# Patient Record
Sex: Male | Born: 1950
Health system: Southern US, Community
[De-identification: ages and names within clinical notes are randomized; demographics above are authoritative.]

## PROBLEM LIST (undated history)

## (undated) DIAGNOSIS — F419 Anxiety disorder, unspecified: Secondary | ICD-10-CM

## (undated) DIAGNOSIS — R002 Palpitations: Secondary | ICD-10-CM

## (undated) DIAGNOSIS — Z9289 Personal history of other medical treatment: Secondary | ICD-10-CM

## (undated) DIAGNOSIS — M1990 Unspecified osteoarthritis, unspecified site: Secondary | ICD-10-CM

## (undated) DIAGNOSIS — K922 Gastrointestinal hemorrhage, unspecified: Secondary | ICD-10-CM

## (undated) DIAGNOSIS — I255 Ischemic cardiomyopathy: Secondary | ICD-10-CM

## (undated) DIAGNOSIS — I10 Essential (primary) hypertension: Secondary | ICD-10-CM

## (undated) DIAGNOSIS — E785 Hyperlipidemia, unspecified: Secondary | ICD-10-CM

## (undated) DIAGNOSIS — E119 Type 2 diabetes mellitus without complications: Secondary | ICD-10-CM

## (undated) DIAGNOSIS — I219 Acute myocardial infarction, unspecified: Secondary | ICD-10-CM

## (undated) DIAGNOSIS — I2510 Atherosclerotic heart disease of native coronary artery without angina pectoris: Secondary | ICD-10-CM

## (undated) HISTORY — PX: CORONARY ANGIOPLASTY: SHX604

## (undated) HISTORY — DX: Anxiety disorder, unspecified: F41.9

## (undated) HISTORY — PX: ANTERIOR CERVICAL DECOMP/DISCECTOMY FUSION: SHX1161

## (undated) HISTORY — DX: Essential (primary) hypertension: I10

## (undated) HISTORY — DX: Palpitations: R00.2

## (undated) HISTORY — DX: Hyperlipidemia, unspecified: E78.5

## (undated) HISTORY — DX: Ischemic cardiomyopathy: I25.5

## (undated) HISTORY — DX: Personal history of other medical treatment: Z92.89

## (undated) HISTORY — DX: Type 2 diabetes mellitus without complications: E11.9

## (undated) HISTORY — DX: Atherosclerotic heart disease of native coronary artery without angina pectoris: I25.10

---

## 1998-09-12 ENCOUNTER — Encounter: Payer: Self-pay | Admitting: Internal Medicine

## 1998-09-12 ENCOUNTER — Ambulatory Visit (HOSPITAL_COMMUNITY): Admission: RE | Admit: 1998-09-12 | Discharge: 1998-09-12 | Payer: Self-pay | Admitting: Internal Medicine

## 1998-10-28 ENCOUNTER — Encounter: Payer: Self-pay | Admitting: Neurosurgery

## 1998-10-28 ENCOUNTER — Ambulatory Visit (HOSPITAL_COMMUNITY): Admission: RE | Admit: 1998-10-28 | Discharge: 1998-10-28 | Payer: Self-pay | Admitting: Neurosurgery

## 1998-11-21 ENCOUNTER — Encounter: Payer: Self-pay | Admitting: Neurosurgery

## 1998-11-21 ENCOUNTER — Ambulatory Visit (HOSPITAL_COMMUNITY): Admission: RE | Admit: 1998-11-21 | Discharge: 1998-11-21 | Payer: Self-pay | Admitting: Neurosurgery

## 1998-12-12 ENCOUNTER — Ambulatory Visit (HOSPITAL_COMMUNITY): Admission: RE | Admit: 1998-12-12 | Discharge: 1998-12-12 | Payer: Self-pay | Admitting: Neurosurgery

## 1998-12-12 ENCOUNTER — Encounter: Payer: Self-pay | Admitting: Neurosurgery

## 2001-09-20 ENCOUNTER — Emergency Department (HOSPITAL_COMMUNITY): Admission: EM | Admit: 2001-09-20 | Discharge: 2001-09-20 | Payer: Self-pay | Admitting: *Deleted

## 2001-09-20 ENCOUNTER — Encounter: Payer: Self-pay | Admitting: *Deleted

## 2010-05-01 ENCOUNTER — Emergency Department (HOSPITAL_COMMUNITY): Admission: EM | Admit: 2010-05-01 | Discharge: 2010-05-02 | Payer: Self-pay | Admitting: Emergency Medicine

## 2010-11-06 LAB — URINALYSIS, ROUTINE W REFLEX MICROSCOPIC
Bilirubin Urine: NEGATIVE
Glucose, UA: >1000 mg/dL — AB
Hgb urine dipstick: NEGATIVE
Ketones, ur: NEGATIVE mg/dL
Leukocytes, UA: NEGATIVE
Nitrite: NEGATIVE
Protein, ur: NEGATIVE mg/dL
Specific Gravity, Urine: 1.01 (ref 1.005–1.030)
Urobilinogen, UA: 0.2 mg/dL (ref 0.0–1.0)
pH: 5.5 (ref 5.0–8.0)

## 2010-11-06 LAB — POCT CARDIAC MARKERS
CKMB, poc: 3.7 ng/mL (ref 1.0–8.0)
Troponin i, poc: <0.05 ng/mL (ref 0.00–0.09)

## 2010-11-06 LAB — GLUCOSE, CAPILLARY: Glucose-Capillary: 272 mg/dL — ABNORMAL HIGH (ref 70–99)

## 2010-11-06 LAB — URINE MICROSCOPIC-ADD ON: Urine-Other: NONE SEEN

## 2010-11-06 LAB — POCT I-STAT, CHEM 8
Calcium, Ion: 1.08 mmol/L — ABNORMAL LOW (ref 1.12–1.32)
HCT: 39 % (ref 39.0–52.0)
TCO2: 23 mmol/L (ref 0–100)

## 2011-04-13 ENCOUNTER — Ambulatory Visit (INDEPENDENT_AMBULATORY_CARE_PROVIDER_SITE_OTHER): Payer: BC Managed Care – PPO | Admitting: Internal Medicine

## 2011-04-13 ENCOUNTER — Encounter: Payer: Self-pay | Admitting: Internal Medicine

## 2011-04-13 DIAGNOSIS — E1159 Type 2 diabetes mellitus with other circulatory complications: Secondary | ICD-10-CM | POA: Insufficient documentation

## 2011-04-13 DIAGNOSIS — E785 Hyperlipidemia, unspecified: Secondary | ICD-10-CM | POA: Insufficient documentation

## 2011-04-13 DIAGNOSIS — E119 Type 2 diabetes mellitus without complications: Secondary | ICD-10-CM

## 2011-04-13 DIAGNOSIS — I10 Essential (primary) hypertension: Secondary | ICD-10-CM | POA: Insufficient documentation

## 2011-04-13 DIAGNOSIS — E1165 Type 2 diabetes mellitus with hyperglycemia: Secondary | ICD-10-CM | POA: Insufficient documentation

## 2011-04-13 LAB — CBC WITH DIFFERENTIAL/PLATELET
Basophils Absolute: 0 10*3/uL (ref 0.0–0.1)
HCT: 43 % (ref 39.0–52.0)
Hemoglobin: 14 g/dL (ref 13.0–17.0)
Lymphs Abs: 1.4 10*3/uL (ref 0.7–4.0)
MCV: 86 fl (ref 78.0–100.0)
Monocytes Absolute: 0.5 10*3/uL (ref 0.1–1.0)
Monocytes Relative: 6.4 % (ref 3.0–12.0)
Neutro Abs: 5 10*3/uL (ref 1.4–7.7)
Platelets: 236 10*3/uL (ref 150.0–400.0)
RDW: 14 % (ref 11.5–14.6)

## 2011-04-13 LAB — COMPREHENSIVE METABOLIC PANEL
ALT: 27 U/L (ref 0–53)
Albumin: 4 g/dL (ref 3.5–5.2)
BUN: 13 mg/dL (ref 6–23)
CO2: 31 mEq/L (ref 19–32)
Chloride: 99 mEq/L (ref 96–112)
Creatinine, Ser: 0.9 mg/dL (ref 0.4–1.5)
Total Protein: 7.2 g/dL (ref 6.0–8.3)

## 2011-04-13 LAB — TSH: TSH: 0.26 u[IU]/mL — ABNORMAL LOW (ref 0.35–5.50)

## 2011-04-13 LAB — MICROALBUMIN / CREATININE URINE RATIO: Microalb Creat Ratio: 2.3 mg/g (ref 0.0–30.0)

## 2011-04-13 NOTE — Assessment & Plan Note (Addendum)
Information provided about diabetes, diet and exercise We'll get baseline labs and restart medications w/ results

## 2011-04-13 NOTE — Assessment & Plan Note (Addendum)
BP today without taking any medication "okay" He used to take lisinopril, consider an ARB or ACE inhibitor. We'll check a microalbumin.

## 2011-04-13 NOTE — Patient Instructions (Signed)
Please bring the records from your previous doctor including EKGs, labs, colonoscopy reports etc.

## 2011-04-13 NOTE — Assessment & Plan Note (Signed)
Used to take simvastatin 40 mg daily. He is not fasting today, will get a FLP on  return to the office.

## 2011-04-13 NOTE — Progress Notes (Signed)
  Subjective:    [name], male    DOB: August 13, 1951, 60 y.o.   MRN: [id number]  HPI New patient, here to get established Diabetes-- diagnosed about 20 years ago, at some point he lost weight and was able to go without medicines for a while however medications were restarted later on. On no medications for about a year, mostly due to lack of insurance , ambulatory sugars when checked around 250. Hypertension--no medications lately, amb BPs usually ok Hyperlipidemia--on no medicines lately, Patient report he was taking the following medications: Simvastatin 40 mg, Januvia 100 mg, Actos 45 mg daily;  lisinopril 40 mg half daily Also Glucophage ER 1000 mg twice a day Cialis 20 mg as needed At some point he also uses Lamisil and Flomax.  Past Medical History  Diagnosis Date  . Diabetes mellitus dx in the 90s  . Hyperlipidemia   . HTN (hypertension)    Past Surgical History  Procedure Date  . Neck surgery in the 90s    "disk removal"   History   Social History  . Marital Status: Married    Spouse Name: N/A    Number of Children: N/A  . Years of Education: N/A   Occupational History  . maintenance tech    Social History Main Topics  . Smoking status: Former Smoker    Quit date: 04/12/1992  . Smokeless tobacco: Not on file   Comment: >20 years ago  . Alcohol Use: Yes     rarely   . Drug Use: No  . Sexually Active: Not on file   Other Topics Concern  . Not on file   Social History Narrative   Moved back to [place] from [facility] 2011   Family History  Problem Relation Age of Onset  . Colon cancer Neg Hx   . Prostate cancer Neg Hx   . Diabetes      M, sister, brothers   . Coronary artery disease      F , MI at age 81   . Stroke Neg Hx     Review of Systems Denies chest pain or shortness of breath No nausea, vomiting, diarrhea No blurred vision, no polyuria but some polydipsia.     Objective:   Physical Exam  Constitutional: He is oriented to  person, place, and time. He appears well-developed and well-nourished. No distress.  HENT:  Head: Normocephalic and atraumatic.  Eyes: No scleral icterus.  Cardiovascular: Normal rate, regular rhythm and normal heart sounds.   No murmur heard. Pulmonary/Chest: Effort normal and breath sounds normal. No respiratory distress. He has no wheezes. He has no rales.  Abdominal: Soft. Bowel sounds are normal. He exhibits no distension. There is no tenderness. There is no rebound.  Musculoskeletal: He exhibits no edema.  Neurological: He is oriented to person, place, and time.  Skin: Skin is warm and dry. He is not diaphoretic.  Psychiatric: He has a normal mood and affect. His behavior is normal. Judgment and thought content normal.          Assessment & Plan:

## 2011-04-15 ENCOUNTER — Telehealth: Payer: Self-pay | Admitting: *Deleted

## 2011-04-15 MED ORDER — GLIMEPIRIDE 4 MG PO TABS: 2.0000 mg | ORAL_TABLET | ORAL | Status: DC

## 2011-04-15 MED ORDER — SITAGLIPTIN PHOS-METFORMIN HCL 50-1000 MG PO TABS: 1.0000 | ORAL_TABLET | Freq: Two times a day (BID) | ORAL | Status: DC

## 2011-04-15 NOTE — Telephone Encounter (Signed)
Message copied by Regis Bill on Wed Apr 15, 2011  5:02 PM ------      Message from: [name]      Created: Tue Apr 14, 2011 12:44 PM       Advise patient      Maryclare Labrador restart his diabetes medicines.      1.  Janumet XR 50/1000 mg: 1 twice a day. If he has some diarrhea, he may need to take one tablet once a day for a few days and then go for twice a day. Ok give samples and a discount card      2. Amaryl 4 mg half tablet daily.      3. Call with blood sugar readings in 2 weeks      4. Followup in 6 weeks as planned      5. TSH slightly abnormal,  will recheck in few  weeks

## 2011-04-15 NOTE — Telephone Encounter (Signed)
Per results note: Pt's wife informed Samples/disc card at front Rx[s] sent to pharmacy.

## 2011-05-07 ENCOUNTER — Telehealth: Payer: Self-pay | Admitting: Internal Medicine

## 2011-05-07 NOTE — Telephone Encounter (Signed)
Blood sugar readings received from patient, they are dropping from the 250s to 130s. Advise patient this is a very positive change, followup as planned.

## 2011-05-07 NOTE — Telephone Encounter (Signed)
Patient's wife informed

## 2011-05-28 ENCOUNTER — Ambulatory Visit (INDEPENDENT_AMBULATORY_CARE_PROVIDER_SITE_OTHER): Payer: BC Managed Care – PPO | Admitting: Internal Medicine

## 2011-05-28 ENCOUNTER — Encounter: Payer: Self-pay | Admitting: Internal Medicine

## 2011-05-28 DIAGNOSIS — M1990 Unspecified osteoarthritis, unspecified site: Secondary | ICD-10-CM | POA: Insufficient documentation

## 2011-05-28 DIAGNOSIS — E119 Type 2 diabetes mellitus without complications: Secondary | ICD-10-CM

## 2011-05-28 DIAGNOSIS — I10 Essential (primary) hypertension: Secondary | ICD-10-CM

## 2011-05-28 DIAGNOSIS — E785 Hyperlipidemia, unspecified: Secondary | ICD-10-CM

## 2011-05-28 LAB — LIPID PANEL
Cholesterol: 197 mg/dL (ref 0–200)
HDL: 40.6 mg/dL (ref >39.00)
LDL Cholesterol: 121 mg/dL — ABNORMAL HIGH (ref 0–99)
Triglycerides: 179 mg/dL — ABNORMAL HIGH (ref 0.0–149.0)

## 2011-05-28 LAB — ALT: ALT: 30 U/L (ref 0–53)

## 2011-05-28 MED ORDER — GLIMEPIRIDE 4 MG PO TABS: 2.0000 mg | ORAL_TABLET | ORAL | Status: DC

## 2011-05-28 MED ORDER — SITAGLIPTIN PHOS-METFORMIN HCL 50-1000 MG PO TABS: 1.0000 | ORAL_TABLET | Freq: Two times a day (BID) | ORAL | Status: DC

## 2011-05-28 NOTE — Assessment & Plan Note (Addendum)
At some point he was lisinopril, currently BP okay, microalbumin negative. Most likely will have to start a cholesterol medication thus will wait before we start an ACE or ARB

## 2011-05-28 NOTE — Assessment & Plan Note (Signed)
Continue with present care. Prescriptions for 90 days provided

## 2011-05-28 NOTE — Progress Notes (Signed)
  Subjective:    [name], male    DOB: 1950-09-29, 60 y.o.   MRN: [id number]  HPI Routine office visit Diabetes--good medication compliance, went from the 200s to 130s range, a couple of times felt slightly shaky when the blood sugars were around 100. Complaint of on and off knee pain, gradually getting worse,  worse on the right.  He also broke more than 50 pages of old records for my review: In the past he has taken Glucophage, Actos, Januvia, Zocor, Cialis , lisinopril, Flomax. Previous EKGs will be a scan Most recent labs are from 2010 (will be scan): CBC normal, creatinine 0.7, potassium 3.6, LFTs normal, TSH normal, total cholesterol 158, LDL 77 (on Zocor 20 mg?) After my review, most papers were returned to the patient for safe keeping  Past Medical History  Diagnosis Date  . Diabetes mellitus dx in the 90s  . Hyperlipidemia   . HTN (hypertension)    Past Surgical History  Procedure Date  . Neck surgery in the 90s    "disk removal"    Review of Systems No knee swelling or redness. No nausea-V-D    Objective:   Physical Exam  Constitutional: He appears well-developed and well-nourished.  HENT:  Head: Normocephalic and atraumatic.  Musculoskeletal: He exhibits no edema.       Knees: No redness, swelling, mild deformities consistent with DJD. Range of motion normal, joints are stable.          Assessment & Plan:  Today , I spent more than 25  min with the patient, >50% of the time reviewing more than 50 pages of old records

## 2011-05-28 NOTE — Assessment & Plan Note (Signed)
Used to be on Zocor at 40 mg per chart review. Labs

## 2011-05-28 NOTE — Assessment & Plan Note (Signed)
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

## 2011-06-05 ENCOUNTER — Telehealth: Payer: Self-pay | Admitting: *Deleted

## 2011-06-05 DIAGNOSIS — N19 Unspecified kidney failure: Secondary | ICD-10-CM

## 2011-06-05 MED ORDER — ATORVASTATIN CALCIUM 10 MG PO TABS: 10.0000 mg | ORAL_TABLET | Freq: Every day | ORAL | Status: DC

## 2011-06-05 NOTE — Telephone Encounter (Signed)
Patient informed of lab results. New Rx to pharmacy. Lab order placed in EMR. Copy of results mailed.

## 2011-06-05 NOTE — Telephone Encounter (Signed)
Message copied by Regis Bill on Fri Jun 05, 2011  5:52 PM ------      Message from: Willow Ora E      Created: Sun May 31, 2011  5:44 PM       Advise patient:      Cholesterol elevated, rec lipitor 10mg  1 po qhs #30, 4 RF      RTC as planned

## 2011-06-30 ENCOUNTER — Encounter: Payer: Self-pay | Admitting: Internal Medicine

## 2011-06-30 ENCOUNTER — Ambulatory Visit (INDEPENDENT_AMBULATORY_CARE_PROVIDER_SITE_OTHER): Payer: BC Managed Care – PPO | Admitting: Internal Medicine

## 2011-06-30 DIAGNOSIS — E119 Type 2 diabetes mellitus without complications: Secondary | ICD-10-CM

## 2011-06-30 DIAGNOSIS — Z23 Encounter for immunization: Secondary | ICD-10-CM

## 2011-06-30 DIAGNOSIS — E785 Hyperlipidemia, unspecified: Secondary | ICD-10-CM

## 2011-06-30 LAB — LIPID PANEL
Cholesterol: 165 mg/dL (ref 0–200)
HDL: 43.6 mg/dL (ref >39.00)
Total CHOL/HDL Ratio: 4
VLDL: 47 mg/dL — ABNORMAL HIGH (ref 0.0–40.0)

## 2011-06-30 LAB — HEMOGLOBIN A1C: Hgb A1c MFr Bld: 7.9 % — ABNORMAL HIGH (ref 4.6–6.5)

## 2011-06-30 MED ORDER — ONETOUCH ULTRASOFT LANCETS MISC: Status: AC

## 2011-06-30 MED ORDER — GLUCOSE BLOOD VI STRP: ORAL_STRIP | Status: DC

## 2011-06-30 NOTE — Assessment & Plan Note (Signed)
Started lipitor 10-12, tolerates well Labs

## 2011-06-30 NOTE — Assessment & Plan Note (Addendum)
Started janumet base on last A1C, no s/e Plan: labs , provided a new glucometer

## 2011-06-30 NOTE — Progress Notes (Signed)
  Subjective:    [name], male    DOB: 04/30/1951, 60 y.o.   MRN: [id number]  HPI Routine office visit, here with his wife High cholesterol--started Lipitor, good medication compliance DM-- good medication compliance  Past Medical History  Diagnosis Date  . Diabetes mellitus dx in the 90s  . Hyperlipidemia   . HTN (hypertension)    Past Surgical History  Procedure Date  . Neck surgery in the 90s    "disk removal"     Review of Systems Denies myalgias or arthralgias, at least no more than usual Blood sugars 140-130, needs a new glucometer  Got a flu shot    Objective:   Physical Exam  Constitutional: He is oriented to person, place, and time. He appears well-developed.  Cardiovascular: Normal rate, regular rhythm and normal heart sounds.   No murmur heard. Pulmonary/Chest: Effort normal and breath sounds normal. No respiratory distress. He has no wheezes. He has no rales.  Musculoskeletal: He exhibits no edema.  Neurological: He is alert and oriented to person, place, and time.          Assessment & Plan:

## 2011-06-30 NOTE — Patient Instructions (Signed)
Call your pharmacy for any refills (they will call us)

## 2011-07-02 ENCOUNTER — Other Ambulatory Visit: Payer: Self-pay

## 2011-07-02 MED ORDER — ATORVASTATIN CALCIUM 10 MG PO TABS: 10.0000 mg | ORAL_TABLET | Freq: Every day | ORAL | Status: DC

## 2011-07-02 MED ORDER — GLUCOSE BLOOD VI STRP: ORAL_STRIP | Status: DC

## 2011-07-03 ENCOUNTER — Telehealth: Payer: Self-pay

## 2011-07-03 NOTE — Telephone Encounter (Signed)
Message copied by Beverely Low on Fri Jul 03, 2011  9:03 AM ------      Message from: Willow Ora E      Created: Wed Jul 01, 2011  6:47 PM       Advise patient,      Diabetes has definitely improved. Increase glimepiride from 1/2 to one  tablet in the morning, other meds the same .      Cholesterol improved       Also next office visit should be 3 months from today, please arrange

## 2011-07-03 NOTE — Telephone Encounter (Signed)
Pt's wife aware of lab results and increase in glimepiride. Pt will call to schedule 3 mo f/u and labs mailed to pt.

## 2011-07-25 HISTORY — PX: CORONARY ANGIOPLASTY WITH STENT PLACEMENT: SHX49

## 2011-07-30 ENCOUNTER — Encounter: Payer: BC Managed Care – PPO | Admitting: Internal Medicine

## 2011-08-06 ENCOUNTER — Other Ambulatory Visit: Payer: Self-pay

## 2011-08-06 ENCOUNTER — Encounter (HOSPITAL_COMMUNITY): Payer: Self-pay | Admitting: Emergency Medicine

## 2011-08-06 ENCOUNTER — Emergency Department (HOSPITAL_COMMUNITY): Payer: BC Managed Care – PPO

## 2011-08-06 ENCOUNTER — Ambulatory Visit (HOSPITAL_COMMUNITY): Admit: 2011-08-06 | Payer: Self-pay | Admitting: Cardiovascular Disease

## 2011-08-06 ENCOUNTER — Encounter (HOSPITAL_COMMUNITY)
Admission: EM | Disposition: A | Discharge status: Home / Self Care | Payer: Self-pay | Source: Ambulatory Visit | Attending: Emergency Medicine

## 2011-08-06 ENCOUNTER — Observation Stay (HOSPITAL_COMMUNITY)
Admission: EM | Admit: 2011-08-06 | Discharge: 2011-08-07 | DRG: 853 | Disposition: A | Discharge status: Home / Self Care | Payer: BC Managed Care – PPO | Source: Ambulatory Visit | Attending: Cardiology | Admitting: Cardiology

## 2011-08-06 DIAGNOSIS — M1990 Unspecified osteoarthritis, unspecified site: Secondary | ICD-10-CM | POA: Insufficient documentation

## 2011-08-06 DIAGNOSIS — E1159 Type 2 diabetes mellitus with other circulatory complications: Secondary | ICD-10-CM | POA: Insufficient documentation

## 2011-08-06 DIAGNOSIS — Z8249 Family history of ischemic heart disease and other diseases of the circulatory system: Secondary | ICD-10-CM | POA: Insufficient documentation

## 2011-08-06 DIAGNOSIS — I2510 Atherosclerotic heart disease of native coronary artery without angina pectoris: Secondary | ICD-10-CM

## 2011-08-06 DIAGNOSIS — I10 Essential (primary) hypertension: Secondary | ICD-10-CM | POA: Insufficient documentation

## 2011-08-06 DIAGNOSIS — R61 Generalized hyperhidrosis: Secondary | ICD-10-CM | POA: Insufficient documentation

## 2011-08-06 DIAGNOSIS — E1165 Type 2 diabetes mellitus with hyperglycemia: Secondary | ICD-10-CM | POA: Insufficient documentation

## 2011-08-06 DIAGNOSIS — Z87891 Personal history of nicotine dependence: Secondary | ICD-10-CM | POA: Insufficient documentation

## 2011-08-06 DIAGNOSIS — Z23 Encounter for immunization: Secondary | ICD-10-CM | POA: Insufficient documentation

## 2011-08-06 DIAGNOSIS — I214 Non-ST elevation (NSTEMI) myocardial infarction: Principal | ICD-10-CM

## 2011-08-06 DIAGNOSIS — R0602 Shortness of breath: Secondary | ICD-10-CM | POA: Insufficient documentation

## 2011-08-06 DIAGNOSIS — E119 Type 2 diabetes mellitus without complications: Secondary | ICD-10-CM | POA: Insufficient documentation

## 2011-08-06 DIAGNOSIS — E785 Hyperlipidemia, unspecified: Secondary | ICD-10-CM | POA: Insufficient documentation

## 2011-08-06 HISTORY — DX: Unspecified osteoarthritis, unspecified site: M19.90

## 2011-08-06 HISTORY — PX: LEFT HEART CATHETERIZATION WITH CORONARY ANGIOGRAM: SHX5451

## 2011-08-06 LAB — CARDIAC PANEL(CRET KIN+CKTOT+MB+TROPI)
Relative Index: 2.2 (ref 0.0–2.5)
Total CK: 464 U/L — ABNORMAL HIGH (ref 7–232)
Total CK: 423 U/L — ABNORMAL HIGH (ref 7–232)
Troponin I: <0.3 ng/mL (ref <0.30)

## 2011-08-06 LAB — URINALYSIS, ROUTINE W REFLEX MICROSCOPIC
Ketones, ur: 15 mg/dL — AB
Leukocytes, UA: NEGATIVE
Nitrite: NEGATIVE
Urobilinogen, UA: 0.2 mg/dL (ref 0.0–1.0)
pH: 7 (ref 5.0–8.0)

## 2011-08-06 LAB — CBC
HCT: 40.9 % (ref 39.0–52.0)
MCHC: 34.5 g/dL (ref 30.0–36.0)
MCV: 82 fL (ref 78.0–100.0)
RDW: 13.6 % (ref 11.5–15.5)

## 2011-08-06 LAB — POCT I-STAT TROPONIN I: Troponin i, poc: 0.1 ng/mL (ref 0.00–0.08)

## 2011-08-06 LAB — COMPREHENSIVE METABOLIC PANEL
AST: 29 U/L (ref 0–37)
Albumin: 3.7 g/dL (ref 3.5–5.2)
Calcium: 9.1 mg/dL (ref 8.4–10.5)
Chloride: 105 mEq/L (ref 96–112)
Creatinine, Ser: 0.59 mg/dL (ref 0.50–1.35)
Total Protein: 7.1 g/dL (ref 6.0–8.3)

## 2011-08-06 LAB — URINE MICROSCOPIC-ADD ON

## 2011-08-06 LAB — DIFFERENTIAL
Basophils Absolute: 0 10*3/uL (ref 0.0–0.1)
Basophils Relative: 1 % (ref 0–1)
Eosinophils Relative: 5 % (ref 0–5)
Lymphocytes Relative: 28 % (ref 12–46)
Monocytes Absolute: 0.7 10*3/uL (ref 0.1–1.0)

## 2011-08-06 LAB — PRO B NATRIURETIC PEPTIDE: Pro B Natriuretic peptide (BNP): 165.1 pg/mL — ABNORMAL HIGH (ref 0–125)

## 2011-08-06 LAB — GLUCOSE, CAPILLARY: Glucose-Capillary: 170 mg/dL — ABNORMAL HIGH (ref 70–99)

## 2011-08-06 LAB — MRSA PCR SCREENING: MRSA by PCR: NEGATIVE

## 2011-08-06 LAB — POCT ACTIVATED CLOTTING TIME: Activated Clotting Time: 479 seconds

## 2011-08-06 SURGERY — LEFT HEART CATHETERIZATION WITH CORONARY ANGIOGRAM
Anesthesia: LOCAL

## 2011-08-06 MED ORDER — HEPARIN BOLUS VIA INFUSION
4000.0000 [IU] | Freq: Once | INTRAVENOUS | Status: AC
  Administered 2011-08-06: 4000 [IU] via INTRAVENOUS
  Filled 2011-08-06: qty 4000

## 2011-08-06 MED ORDER — ALPRAZOLAM 0.25 MG PO TABS: 0.2500 mg | ORAL_TABLET | Freq: Two times a day (BID) | ORAL | Status: DC | PRN

## 2011-08-06 MED ORDER — BIVALIRUDIN 250 MG IV SOLR
INTRAVENOUS | Status: AC
  Filled 2011-08-06: qty 250

## 2011-08-06 MED ORDER — SODIUM CHLORIDE 0.9 % IJ SOLN: 3.0000 mL | INTRAMUSCULAR | Status: DC | PRN

## 2011-08-06 MED ORDER — NITROGLYCERIN IN D5W 200-5 MCG/ML-% IV SOLN: 3.0000 ug/min | INTRAVENOUS | Status: DC

## 2011-08-06 MED ORDER — MORPHINE SULFATE 2 MG/ML IJ SOLN
2.0000 mg | INTRAMUSCULAR | Status: DC | PRN
  Administered 2011-08-06: 2 mg via INTRAVENOUS
  Filled 2011-08-06: qty 1

## 2011-08-06 MED ORDER — SODIUM CHLORIDE 0.9 % IV SOLN: 250.0000 mL | INTRAVENOUS | Status: DC | PRN

## 2011-08-06 MED ORDER — CLOPIDOGREL BISULFATE 300 MG PO TABS
ORAL_TABLET | ORAL | Status: AC
  Filled 2011-08-06: qty 2

## 2011-08-06 MED ORDER — NITROGLYCERIN 0.4 MG SL SUBL
0.4000 mg | SUBLINGUAL_TABLET | SUBLINGUAL | Status: DC | PRN
  Administered 2011-08-06 (×2): 0.4 mg via SUBLINGUAL

## 2011-08-06 MED ORDER — SODIUM CHLORIDE 0.9 % IJ SOLN: 3.0000 mL | Freq: Two times a day (BID) | INTRAMUSCULAR | Status: DC

## 2011-08-06 MED ORDER — METOPROLOL TARTRATE 25 MG PO TABS
25.0000 mg | ORAL_TABLET | Freq: Four times a day (QID) | ORAL | Status: DC
  Administered 2011-08-07 (×2): 25 mg via ORAL
  Filled 2011-08-06 (×5): qty 1

## 2011-08-06 MED ORDER — ROSUVASTATIN CALCIUM 20 MG PO TABS: 20.0000 mg | ORAL_TABLET | Freq: Every day | ORAL | Status: DC

## 2011-08-06 MED ORDER — GLIMEPIRIDE 4 MG PO TABS
4.0000 mg | ORAL_TABLET | Freq: Every day | ORAL | Status: DC
Start: 2011-08-07 — End: 2011-08-07
  Administered 2011-08-07: 4 mg via ORAL
  Filled 2011-08-06 (×2): qty 1

## 2011-08-06 MED ORDER — MORPHINE SULFATE 4 MG/ML IJ SOLN
4.0000 mg | INTRAMUSCULAR | Status: DC | PRN
  Administered 2011-08-06: 4 mg via INTRAVENOUS
  Filled 2011-08-06: qty 1

## 2011-08-06 MED ORDER — ONDANSETRON HCL 4 MG/2ML IJ SOLN: 4.0000 mg | Freq: Four times a day (QID) | INTRAMUSCULAR | Status: DC | PRN

## 2011-08-06 MED ORDER — VERAPAMIL HCL 2.5 MG/ML IV SOLN
INTRAVENOUS | Status: AC
  Filled 2011-08-06: qty 2

## 2011-08-06 MED ORDER — INSULIN ASPART 100 UNIT/ML ~~LOC~~ SOLN
0.0000 [IU] | Freq: Three times a day (TID) | SUBCUTANEOUS | Status: DC
  Administered 2011-08-07: 5 [IU] via SUBCUTANEOUS
  Administered 2011-08-07: 3 [IU] via SUBCUTANEOUS
  Filled 2011-08-06: qty 1

## 2011-08-06 MED ORDER — SODIUM CHLORIDE 0.9 % IJ SOLN
3.0000 mL | Freq: Two times a day (BID) | INTRAMUSCULAR | Status: DC
  Administered 2011-08-07: 3 mL via INTRAVENOUS

## 2011-08-06 MED ORDER — INSULIN ASPART 100 UNIT/ML ~~LOC~~ SOLN
0.0000 [IU] | Freq: Every day | SUBCUTANEOUS | Status: DC
  Administered 2011-08-06: 2 [IU] via SUBCUTANEOUS
  Filled 2011-08-06: qty 3

## 2011-08-06 MED ORDER — PNEUMOCOCCAL VAC POLYVALENT 25 MCG/0.5ML IJ INJ
0.5000 mL | INJECTION | INTRAMUSCULAR | Status: AC
  Administered 2011-08-07: 0.5 mL via INTRAMUSCULAR
  Filled 2011-08-06: qty 0.5

## 2011-08-06 MED ORDER — ACETAMINOPHEN 325 MG PO TABS: 650.0000 mg | ORAL_TABLET | ORAL | Status: DC | PRN

## 2011-08-06 MED ORDER — ASPIRIN EC 81 MG PO TBEC
81.0000 mg | DELAYED_RELEASE_TABLET | Freq: Every day | ORAL | Status: DC
  Administered 2011-08-07: 81 mg via ORAL
  Filled 2011-08-06: qty 1

## 2011-08-06 MED ORDER — ROSUVASTATIN CALCIUM 40 MG PO TABS
40.0000 mg | ORAL_TABLET | Freq: Every day | ORAL | Status: DC
  Administered 2011-08-06 – 2011-08-07 (×2): 40 mg via ORAL
  Filled 2011-08-06 (×2): qty 1

## 2011-08-06 MED ORDER — METOPROLOL TARTRATE 25 MG PO TABS
ORAL_TABLET | ORAL | Status: AC
  Filled 2011-08-06: qty 1

## 2011-08-06 MED ORDER — HEPARIN (PORCINE) IN NACL 100-0.45 UNIT/ML-% IJ SOLN
1500.0000 [IU]/h | INTRAMUSCULAR | Status: DC
  Filled 2011-08-06 (×2): qty 250

## 2011-08-06 MED ORDER — HEPARIN SODIUM (PORCINE) 1000 UNIT/ML IJ SOLN
INTRAMUSCULAR | Status: AC
  Filled 2011-08-06: qty 1

## 2011-08-06 MED ORDER — NITROGLYCERIN 0.4 MG SL SUBL: 0.4000 mg | SUBLINGUAL_TABLET | SUBLINGUAL | Status: DC | PRN

## 2011-08-06 MED ORDER — HEPARIN SOD (PORCINE) IN D5W 100 UNIT/ML IV SOLN
INTRAVENOUS | Status: AC
  Administered 2011-08-06: 25000 [IU] via INTRAVENOUS
  Filled 2011-08-06: qty 250

## 2011-08-06 MED ORDER — HEPARIN (PORCINE) IN NACL 2-0.9 UNIT/ML-% IJ SOLN
INTRAMUSCULAR | Status: AC
  Filled 2011-08-06: qty 2000

## 2011-08-06 MED ORDER — MIDAZOLAM HCL 2 MG/2ML IJ SOLN
INTRAMUSCULAR | Status: AC
  Filled 2011-08-06: qty 2

## 2011-08-06 MED ORDER — NITROGLYCERIN 0.2 MG/ML ON CALL CATH LAB
INTRAVENOUS | Status: AC
  Filled 2011-08-06: qty 1

## 2011-08-06 MED ORDER — CLOPIDOGREL BISULFATE 75 MG PO TABS
75.0000 mg | ORAL_TABLET | Freq: Every day | ORAL | Status: DC
  Administered 2011-08-07: 75 mg via ORAL
  Filled 2011-08-06 (×2): qty 1

## 2011-08-06 MED ORDER — HEPARIN (PORCINE) IN NACL 100-0.45 UNIT/ML-% IJ SOLN
1000.0000 [IU]/h | INTRAMUSCULAR | Status: DC
  Administered 2011-08-06: 1000 [IU]/h via INTRAVENOUS
  Filled 2011-08-06: qty 250

## 2011-08-06 MED ORDER — NITROGLYCERIN IN D5W 200-5 MCG/ML-% IV SOLN
2.0000 ug/min | INTRAVENOUS | Status: DC
  Administered 2011-08-06: 5 ug/min via INTRAVENOUS
  Filled 2011-08-06: qty 250

## 2011-08-06 MED ORDER — OXYCODONE-ACETAMINOPHEN 5-325 MG PO TABS: 1.0000 | ORAL_TABLET | ORAL | Status: DC | PRN

## 2011-08-06 MED ORDER — GI COCKTAIL ~~LOC~~
30.0000 mL | Freq: Three times a day (TID) | ORAL | Status: DC | PRN
  Administered 2011-08-06: 30 mL via ORAL
  Filled 2011-08-06 (×2): qty 30

## 2011-08-06 MED ORDER — ZOLPIDEM TARTRATE 5 MG PO TABS: 5.0000 mg | ORAL_TABLET | Freq: Every evening | ORAL | Status: DC | PRN

## 2011-08-06 MED ORDER — ASPIRIN 81 MG PO CHEW
324.0000 mg | CHEWABLE_TABLET | Freq: Once | ORAL | Status: AC
  Administered 2011-08-06: 324 mg via ORAL
  Filled 2011-08-06: qty 4

## 2011-08-06 MED ORDER — FENTANYL CITRATE 0.05 MG/ML IJ SOLN
INTRAMUSCULAR | Status: AC
  Filled 2011-08-06: qty 2

## 2011-08-06 MED ORDER — THERA M PLUS PO TABS
1.0000 | ORAL_TABLET | Freq: Every day | ORAL | Status: DC
  Administered 2011-08-07: 1 via ORAL
  Filled 2011-08-06: qty 1

## 2011-08-06 MED ORDER — PANTOPRAZOLE SODIUM 40 MG PO TBEC
40.0000 mg | DELAYED_RELEASE_TABLET | Freq: Every day | ORAL | Status: DC
  Administered 2011-08-07: 40 mg via ORAL
  Filled 2011-08-06: qty 1

## 2011-08-06 MED ORDER — ASPIRIN 81 MG PO CHEW: 81.0000 mg | CHEWABLE_TABLET | Freq: Every day | ORAL | Status: DC

## 2011-08-06 MED ORDER — LIDOCAINE HCL (PF) 1 % IJ SOLN
INTRAMUSCULAR | Status: AC
  Filled 2011-08-06: qty 30

## 2011-08-06 MED ORDER — SODIUM CHLORIDE 0.9 % IV SOLN: INTRAVENOUS | Status: AC

## 2011-08-06 NOTE — Interval H&P Note (Signed)
History and Physical Interval Note:  The patient is here today for a left heart catheterization. I have personally reviewed the labs and examined the patient. The History and Physical has been reviewed and there are no changes. The risks and benefits of the procedure have been reviewed with the patient. Informed consent has been signed by the patient and is present in the chart. The patient agrees to proceed with the procedure.    Randy Castro 2:35 PM 08/06/2011

## 2011-08-06 NOTE — H&P (Signed)
History and Physical  Patient ID: Randy Castro. Patient ID: Randy Castro. MRN: [id number], DOB/AGE: 59-Sep-1952 59 y.o. Date of Encounter: 08/06/2011  Primary Physician: Dr Drue Novel  Primary Cardiologist: none  Chief Complaint: chest pain, elevated CKMB  HPI:  [name] is a 60 year old male with no previous history of coronary artery disease. He was in his usual state of health until 2 days ago. He had onset of substernal chest pain he describes is both a burning and a pressure while exerting himself climbing ladders. He was short of breath with this also. He felt fatigued and as though his blood sugar were low but did not check it. He rested and ate and the symptoms resolved. He continued to feel some fatigue but nothing else. He had never had this before.  Yesterday, he felt some fatigue but no other symptoms. Last p.m., he drank a diet coke and had onset of burning and pressure chest pain at a 4/10. He denies shortness of breath or nausea. He took a Prilosec OTC and his symptoms improved. He continued to feel intermittent pain throughout the night and slept poorly. This a.m. he was still complaining of 2/10 pain so he took another Prilosec. After that, he ate and drank coffee. He then had onset of 9 or 10/10 chest pain that he describes as the same burning and pressure sensation. He was a little short of breath with it. He denies nausea. He was not diaphoretic. He feels that his skin was sensitive and every touch was painful. He felt fatigued. His wife brought him to the emergency room. In the emergency room he received sublingual nitroglycerin x2 as well as aspirin 81 mg x4. His symptoms have currently improved and his chest pain is a 1 or 2/10. He feels the nitroglycerin and made the most difference. He ate breakfast about 6am.  Past Medical History  Diagnosis Date  . Diabetes mellitus dx in the 90s  . Hyperlipidemia   . HTN (hypertension)   . Arthritis      Surgical History:  Past  Surgical History  Procedure Date  . Neck surgery in the 90s    "disk removal"     I have reviewed the patient's current medications. Continuous:  . heparin 1,500 Units/hr (08/06/11 1016)  . nitroGLYCERIN 5 mcg/min (08/06/11 0957)   nitroglycerin Sublingual x 2   Meds: Current facility-administered medications: 1. aspirin chewable tablet 324 mg, 324 mg, Oral, Once, Gwyneth Sprout, MD, 324 mg at 08/06/11 0825;  2.  heparin ADULT infusion 100 units/mL (25000 units/250 mL), 1,500 Units/hr, Intravenous,Last Rate: 15 mL/hr  3. heparin bolus via infusion 4,000 Units, 4,000 Units, Intravenous, Once, Gwyneth Sprout, MD, 4. nitroGLYCERIN (NITROSTAT) SL tablet 0.4 mg, 0.4 mg, Sublingual, Q5 min PRN, Gwyneth Sprout, MD,  5.  nitroGLYCERIN 0.2 mg/mL in dextrose 5 % infusion, 2-200 mcg/min, Intravenous,  6. pneumococcal 23 valent vaccine (PNU-IMMUNE) injection 0.5 mL, 0.5 mL, Intramuscular, Tomorrow-1000,   Current outpatient prescriptions: 1.atorvastatin (LIPITOR) 10 MG tablet, Take 10 mg by mouth at bedtime. Recently started   2. glimepiride (AMARYL) 4 MG tablet, Take 4 mg by mouth every morning.  , Disp: , Rfl: ;  3.  Multiple Vitamins-Minerals (MULTIVITAMINS THER. W/MINERALS) TABS, Take 1 tablet by mouth daily.   4.  sitaGLIPtan-metformin (JANUMET) 50-1000 MG per tablet, Take 1 tablet by mouth 2 (two) times daily   Allergies: No Known Allergies  History   Social History  . Marital Status: Married  Spouse Name: N/A    Number of Children: N/A  . Years of Education: N/A   Occupational History  . maintenance tech    Social History Main Topics  . Smoking status: Former Smoker    Quit date: 04/12/1992  . Smokeless tobacco: Never Used   Quit  >25 years ago  . Alcohol Use: Yes     rarely   . Drug Use: No  . Sexually Active: Yes   Social History Narrative   Moved back to [place] from [facility] 2011     Family History  Problem Relation Age of Onset  . Colon cancer Neg Hx   . Prostate  cancer Neg Hx   . Diabetes      Mother, sister, brothers   . Coronary artery disease Mother died with CAD at 67     Father  Died of MI at age 73, 1 brother with CAD, another brother with CHF  . Stroke Neg Hx      Physical Exam: Blood pressure 160/84, pulse 59, temperature 97.4 F (36.3 C), temperature source Oral, resp. rate 20, height 6\' 2"  (1.88 m), weight 265 lb (120.203 kg), SpO2 97.00%. General: Well developed, well nourished, in no acute distress. Head: Normocephalic, atraumatic, sclera non-icteric, no xanthomas, nares are without discharge.  Neck: Negative for carotid bruits. JVD not elevated. No thyromegally Lungs: Clear bilaterally to auscultation without wheezes, rales, or rhonchi. Breathing is unlabored. Heart: RRR with S1 S2. No murmurs, rubs, or gallops appreciated. Abdomen: Soft, non-tender, non-distended with normoactive bowel sounds. No hepatomegaly. No rebound/guarding. No obvious abdominal masses. Msk:  Strength and tone appear normal for age. Extremities: No clubbing or cyanosis. No edema.  Distal pedal pulses are 2+ and equal bilaterally. Neuro: Alert and oriented X 3. Moves all extremities spontaneously. No focal deficits noted. Psych:  Responds to questions appropriately with a normal affect. Skin: No rashes or lesions noted  Review of Systems: Occasional musculoskeletal aches and pains, no recent illnesses/fevers/chills, no URI, pt pretty good with a diabetic diet but does not check blood sugars that much. All other systems reviewed and are otherwise negative except as noted above.  Labs:  Lab Results  Component Value Date   WBC 6.9 08/06/2011   HGB 14.1 08/06/2011   HCT 40.9 08/06/2011   MCV 82.0 08/06/2011   PLT 229 08/06/2011    Lab 08/06/11 0810  NA 138  K 3.8  CL 105  CO2 23  BUN 12  CREATININE 0.59  CALCIUM 9.1  PROT 7.1  BILITOT 0.4  ALKPHOS 85  ALT 32  AST 29  GLUCOSE 289*    Basename 08/06/11 0810  CKTOTAL 464*  CKMB 10.2*  Index  2.2  TROPONINI <0.30   Lab Results  Component Value Date   CHOL 165 06/30/2011   HDL 43.60 06/30/2011   LDLCALC 121* 05/28/2011   TRIG 235.0* 06/30/2011   No results found for this basename: DDIMER    Radiology/Studies:  Dg Chest Port 1 View 08/06/2011  *RADIOLOGY REPORT*  Clinical Data: Chest pain, left arm pain, nausea.  PORTABLE CHEST - 1 VIEW  Comparison: None  Findings: Mild bibasilar and perihilar opacities, likely atelectasis.  Low lung volumes.  Heart is normal size.  No effusions.  IMPRESSION: Areas of atelectasis bilaterally.  Original Report Authenticated By: Cyndie Chime, M.D.     EKG: SR, lateral T wave flattening from 2011  ASSESSMENT AND PLAN: The patient was seen today by Dr Shirlee Latch, the patient evaluated and the  data reviewed. His chest pain has both typical and atypical features. The initial episode started with exertion. The other 2 episodes started after meals but improved with nitroglycerin. Additionally, his CK-MB is elevated and his EKG is slightly different from an EKG dated 2011. He also has multiple cardiac risk factors and including a strong family history of premature coronary artery disease.   He will be admitted. We will continue the IV nitroglycerin and heparin. He will also be continued on aspirin, and a statin. Sliding scale insulin will be added to manage his blood sugars. We will add a beta blocker to his medication regimen for better heart rate and blood pressure control.   The risks and benefits of a cardiac catheterization including, but not limited to, death, stroke, MI, kidney damage and bleeding were discussed with the patient and his family.  Signed, Bjorn Loser Barrett PA-C 08/06/2011, 11:00 AM   Patient seen with PA, agree with note.  History of DM, HTN, hyperlipidemia.  Patient has NSTEMI with elevated CKMB and troponin.  Lateral T wave flattening on ECG. Chest pain on and off x 2 days, CP now very mild but still present.  - Left heart cath today.    - ASA, heparin gtt, NTG gtt (titrate to pain-free), Crestor 40, metoprolol 25 mg q6 hrs.   - As he will be going to cath lab soon, will defer upfront ticagrelor or Plavix.   [name] 08/06/2011 1:28 PM

## 2011-08-06 NOTE — ED Notes (Signed)
Plunkett, MD notified of abnormal lab test results 

## 2011-08-06 NOTE — ED Notes (Signed)
Did and CBG on the patient it was 87 notified RN Tammy Sours of the results

## 2011-08-06 NOTE — ED Notes (Signed)
Pt c/o mid sternal CP with radiation to left arm starting yesterday intermittently becoming worse today; pt sts pain some SOB and diaphoresis; pt denies nausea; pt denies fever or cough

## 2011-08-06 NOTE — Progress Notes (Signed)
ANTICOAGULATION CONSULT NOTE - Initial Consult  Pharmacy Consult for Heparin Indication: chest pain/ACS  No Known Allergies  Patient Measurements: Height: 6\' 2"  (188 cm) Weight: 265 lb (120.203 kg) IBW/kg (Calculated) : 82.2  Heparin Dosing Weight: 108kg  Vital Signs: Temp: 97.4 F (36.3 C) (12/13 0752) Temp src: Oral (12/13 0752) BP: 160/84 mmHg (12/13 0830) Pulse Rate: 59  (12/13 0830)  Labs:  [facility] 08/06/11 0810  HGB 14.1  HCT 40.9  PLT 229  APTT 26  LABPROT 14.1  INR 1.07  HEPARINUNFRC --  CREATININE 0.59  CKTOTAL 464*  CKMB 10.2*  TROPONINI <0.30   Estimated Creatinine Clearance: 137 ml/min (by C-G formula based on Cr of 0.59).  Medical History: Past Medical History  Diagnosis Date  . Diabetes mellitus dx in the 90s  . Hyperlipidemia   . HTN (hypertension)     Home Medications:  Lipitor 10mg  QHS Amaryl 4mg  QAM MVI Qday Janumet 50/1000 BID with meals  Anticoagulation: Heparin 4000 units IV bolus given at 0935 Heparin infusion started at 1000 units/hr at 0939  Assessment: NSTEMI: To continue anticoagulation with Heparin for NSTEMI.  Current Heparin infusion rate is ~9 units/kg/hr which is lower than the recommended starting rate of 14 units/kg/hr.  Goal of Therapy:  Heparin level 0.3-0.7 units/ml   Plan:  Increase Heparin infusion to 1500 units/hr. Check Heparin level 6 hours after starting Heparin. Daily Heparin level and CBC.  Estella Husk, Pharm.D., BCPS Clinical Pharmacist  Pager [phone] 08/06/2011, 9:54 AM

## 2011-08-06 NOTE — Progress Notes (Signed)
Visited with patient who spoke about his fear earlier in the morning.  He reported that he is feeling much better now.  I offered support.   Thornell Sartorius 12:19 PM   08/06/11 1200  Clinical Encounter Type  Visited With Patient  Visit Type Initial

## 2011-08-06 NOTE — ED Notes (Addendum)
Lab called with troponin of 0.34

## 2011-08-06 NOTE — ED Provider Notes (Addendum)
History     CSN: [id number] Arrival date & time: 08/06/2011  7:48 AM   First MD Initiated Contact with Patient 08/06/11 [phone]      Chief Complaint  Patient presents with  . Chest Pain    (Consider location/radiation/quality/duration/timing/severity/associated sxs/prior treatment) Patient is a 60 y.o. male presenting with chest pain. The history is provided by the patient.  Chest Pain The chest pain began yesterday. Duration of episode(s) is 30 minutes. Chest pain occurs intermittently. The chest pain is improving. The pain is associated with exertion (Now it gets worse with even standing). At its most intense, the pain is at 9/10. The pain is currently at 4/10. The severity of the pain is severe. The quality of the pain is described as aching, pressure-like and vice-like. The pain radiates to the left arm and right arm. Chest pain is worsened by exertion. Primary symptoms include shortness of breath. Pertinent negatives for primary symptoms include no cough, no wheezing, no palpitations, no abdominal pain, no nausea, no vomiting and no dizziness.  Associated symptoms include diaphoresis.  Pertinent negatives for associated symptoms include no lower extremity edema, no numbness and no weakness. He tried nothing for the symptoms. Risk factors include male gender (Former smoker).  His past medical history is significant for diabetes, hyperlipidemia and hypertension.  His family medical history is significant for CAD in family and early MI in family. Family history comments: His father and sister both died in their 47s of MIs. His brother who is 15 years older than him has had multiple stents and CABG. His mother also had a heart attack.  Procedure history is negative for cardiac catheterization, stress echo and exercise treadmill test.     Past Medical History  Diagnosis Date  . Diabetes mellitus dx in the 90s  . Hyperlipidemia   . HTN (hypertension)     Past Surgical History  Procedure  Date  . Neck surgery in the 90s    "disk removal"    Family History  Problem Relation Age of Onset  . Colon cancer Neg Hx   . Prostate cancer Neg Hx   . Diabetes      M, sister, brothers   . Coronary artery disease      F , MI at age 15   . Stroke Neg Hx     History  Substance Use Topics  . Smoking status: Former Smoker    Quit date: 04/12/1992  . Smokeless tobacco: Not on file   Comment: >20 years ago  . Alcohol Use: Yes     rarely       Review of Systems  Constitutional: Positive for diaphoresis.  Respiratory: Positive for shortness of breath. Negative for cough and wheezing.   Cardiovascular: Positive for chest pain. Negative for palpitations.  Gastrointestinal: Negative for nausea, vomiting and abdominal pain.  Neurological: Negative for dizziness, weakness and numbness.  All other systems reviewed and are negative.    Allergies  Review of patient's allergies indicates no known allergies.  Home Medications   Current Outpatient Rx  Name Route Sig Dispense Refill  . ATORVASTATIN CALCIUM 10 MG PO TABS Oral Take 1 tablet (10 mg total) by mouth at bedtime. 30 tablet 3  . GLIMEPIRIDE 4 MG PO TABS Oral Take 0.5 tablets (2 mg total) by mouth every morning. 90 tablet 1  . GLUCOSE BLOOD VI STRP  Use as instructed 100 each 3    Dx:250.00  . ONETOUCH ULTRASOFT LANCETS MISC  PRN 100 each  5    Dx;250.00  . ONE-A-DAY MENS PO Oral Take 1 each by mouth daily.      Marland Kitchen SITAGLIPTIN-METFORMIN HCL 50-1000 MG PO TABS Oral Take 1 tablet by mouth 2 (two) times daily with a meal. 180 tablet 1    BP 147/72  Pulse 63  Temp(Src) 97.4 F (36.3 C) (Oral)  Resp 18  SpO2 95%  Physical Exam  Nursing note and vitals reviewed. Constitutional: He is oriented to person, place, and time. He appears well-developed and well-nourished. No distress.  HENT:  Head: Normocephalic and atraumatic.  Mouth/Throat: Oropharynx is clear and moist.  Eyes: Conjunctivae and EOM are normal. Pupils are  equal, round, and reactive to light.  Neck: Normal range of motion. Neck supple.  Cardiovascular: Normal rate, regular rhythm and intact distal pulses.   No murmur heard. Pulmonary/Chest: Effort normal and breath sounds normal. No respiratory distress. He has no wheezes. He has no rales.  Abdominal: Soft. He exhibits no distension. There is no tenderness. There is no rebound and no guarding.  Musculoskeletal: Normal range of motion. He exhibits no edema and no tenderness.  Neurological: He is alert and oriented to person, place, and time.  Skin: Skin is warm and dry. No rash noted. No erythema.  Psychiatric: He has a normal mood and affect. His behavior is normal.    ED Course  Procedures (including critical care time)  Results for orders placed during the hospital encounter of 08/06/11  CBC      Component Value Range   WBC 6.9  4.0 - 10.5 (K/uL)   RBC 4.99  4.22 - 5.81 (MIL/uL)   Hemoglobin 14.1  13.0 - 17.0 (g/dL)   HCT 16.1  09.6 - 04.5 (%)   MCV 82.0  78.0 - 100.0 (fL)   MCH 28.3  26.0 - 34.0 (pg)   MCHC 34.5  30.0 - 36.0 (g/dL)   RDW 40.9  81.1 - 91.4 (%)   Platelets 229  150 - 400 (K/uL)  DIFFERENTIAL      Component Value Range   Neutrophils Relative 56  43 - 77 (%)   Neutro Abs 3.9  1.7 - 7.7 (K/uL)   Lymphocytes Relative 28  12 - 46 (%)   Lymphs Abs 1.9  0.7 - 4.0 (K/uL)   Monocytes Relative 10  3 - 12 (%)   Monocytes Absolute 0.7  0.1 - 1.0 (K/uL)   Eosinophils Relative 5  0 - 5 (%)   Eosinophils Absolute 0.4  0.0 - 0.7 (K/uL)   Basophils Relative 1  0 - 1 (%)   Basophils Absolute 0.0  0.0 - 0.1 (K/uL)  PROTIME-INR      Component Value Range   Prothrombin Time 14.1  11.6 - 15.2 (seconds)   INR 1.07  0.00 - 1.49   APTT      Component Value Range   aPTT 26  24 - 37 (seconds)  CARDIAC PANEL(CRET KIN+CKTOT+MB+TROPI)      Component Value Range   Total CK 464 (*) 7 - 232 (U/L)   CK, MB 10.2 (*) 0.3 - 4.0 (ng/mL)   Troponin I <0.30  <0.30 (ng/mL)   Relative Index  2.2  0.0 - 2.5   COMPREHENSIVE METABOLIC PANEL      Component Value Range   Sodium 138  135 - 145 (mEq/L)   Potassium 3.8  3.5 - 5.1 (mEq/L)   Chloride 105  96 - 112 (mEq/L)   CO2 23  19 - 32 (mEq/L)  Glucose, Bld 289 (*) 70 - 99 (mg/dL)   BUN 12  6 - 23 (mg/dL)   Creatinine, Ser 1.61  0.50 - 1.35 (mg/dL)   Calcium 9.1  8.4 - 09.6 (mg/dL)   Total Protein 7.1  6.0 - 8.3 (g/dL)   Albumin 3.7  3.5 - 5.2 (g/dL)   AST 29  0 - 37 (U/L)   ALT 32  0 - 53 (U/L)   Alkaline Phosphatase 85  39 - 117 (U/L)   Total Bilirubin 0.4  0.3 - 1.2 (mg/dL)   GFR calc non Af Amer >90  >90 (mL/min)   GFR calc Af Amer >90  >90 (mL/min)  POCT I-STAT TROPONIN I      Component Value Range   Troponin i, poc 0.10 (*) 0.00 - 0.08 (ng/mL)   Comment NOTIFIED PHYSICIAN     Comment 3            Dg Chest Port 1 View  08/06/2011  *RADIOLOGY REPORT*  Clinical Data: Chest pain, left arm pain, nausea.  PORTABLE CHEST - 1 VIEW  Comparison: None  Findings: Mild bibasilar and perihilar opacities, likely atelectasis.  Low lung volumes.  Heart is normal size.  No effusions.  IMPRESSION: Areas of atelectasis bilaterally.  Original Report Authenticated By: Cyndie Chime, M.D.      Date: 08/06/2011  Rate: 61  Rhythm: normal sinus rhythm  QRS Axis: normal  Intervals: normal  ST/T Wave abnormalities: normal  Conduction Disutrbances:none  Narrative Interpretation:   Old EKG Reviewed: unchanged  CRITICAL CARE Performed by: Gwyneth Sprout   Total critical care time: 30  Critical care time was exclusive of separately billable procedures and treating other patients.  Critical care was necessary to treat or prevent imminent or life-threatening deterioration.  Critical care was time spent personally by me on the following activities: development of treatment plan with patient and/or surrogate as well as nursing, discussions with consultants, evaluation of patient's response to treatment, examination of patient,  obtaining history from patient or surrogate, ordering and performing treatments and interventions, ordering and review of laboratory studies, ordering and review of radiographic studies, pulse oximetry and re-evaluation of patient's condition.   No diagnosis found.    MDM   Patient with story concerning for cardiac chest pain. Multiple risk factors and a TIMI 2. Patient has multiple family members including a sister and his father who died in their 72s of MI. His chest pain began yesterday while he was working on his car which seems to improve with rest however today the pain has been constant and gets worse even with standing. His wife states that he breaks out in a sweat become short of breath and has to sit down for the pain to improve. He denies any infectious symptoms, cough, symptoms suggestive of PE, and denies any hypoglycemic events. Patient is mildly uncomfortable here but otherwise has a normal exam and a normal EKG. CBC, CMP, cardiac enzymes, chest x-ray pending. Patient was given aspirin 325 and will give nitroglycerin to try and alleviate chest pain.   8:50 AM Point-of-care troponin elevated at 0.10 with normal CBC and chest x-ray. After one nitroglycerin pain was down to 2/10. Will start a heparin drip for patient's NSTEMI.  9:43 AM Nitro drip started due to not being completely chest pain-free.   Gwyneth Sprout, MD 08/06/11 0454  Gwyneth Sprout, MD 08/06/11 [phone]

## 2011-08-06 NOTE — Op Note (Signed)
Cardiac Catheterization Operative Report  Randy Castro [id number] 12/13/20123:57 PM No primary provider on file.  Procedure Performed:  1. Left Heart Catheterization 2. Selective Coronary Angiography 3. Left ventricular angiogram 4. PTCA/DES x 1 LAD  Operator: Verne Carrow, MD  Indication: NSTEMI                                      Procedure Details: The risks, benefits, complications, treatment options, and expected outcomes were discussed with the patient. The patient and/or family concurred with the proposed plan, giving informed consent. The patient was brought to the cath lab after IV hydration was begun and oral premedication was given. The patient was further sedated with Versed and Fentanyl. An Allens test was positive. The right wrist was prepped and draped in the usual manner. 1% lidocaine was used for local anesthesia. Using the modified Seldinger access technique, a 5 French sheath was placed in the right radial artery. Verapamil 3 mg IV x 1 was given through the sheath. 4000 units IV heparin was given. Standard diagnostic catheters were used to perform selective coronary angiography. A pigtail catheter was used to perform a left ventricular angiogram. I elected to proceed to PCI. The sheath was upsized to a 6 Fr sheath. A XB LAD 3.5 guide was used to engage the left main. An angiomax bolus was given and a drip was started. When the ACT was greater than 200, a Cougar IC wire was passed down the LAD and another Cougar wire was passed down the Diagonal. A 2.5 x 10 mm Cutting balloon was used to dilate the mid LAD x 2. A 3.0 x 16 mm Promus Element DES was deployed in the mid vessel. A 3.25 mm x 12 mm Mayer balloon was used to post-dilate the stent. There was an excellent result. There was excellent flow down the vessel. The diagonal had excellent flow.    There were no immediate complications. The patient was taken to the recovery area in stable condition.   Hemodynamic  Findings: Central aortic pressure: 137/77 Left ventricular pressure: 129/15/25  Angiographic Findings:  Left main: No disease.   Left Anterior Descending Artery: Just beyond the large diagonal branch there is a 99% stenosis in the mid LAD. The remainder of the vessel has TIMI 2 flow but no obstructive disease.   Circumflex Artery: Large dominant without obstructive disease.   Right Coronary Artery: Non-dominant, mild plaque.   Left Ventricular Angiogram: LVEF 40-45%. Hypokinesis of the anterior wall.    Impression: 1. Severe single vessel CAD 2. Mild to moderate LV dysfunction.  3. NSTEMI 4. Successful PTCA/DES x 1 mid LAD  Recommendations: ASA, Plavix for at least one year. Will start statin.        Complications:  None. The patient tolerated the procedure well.

## 2011-08-07 DIAGNOSIS — I369 Nonrheumatic tricuspid valve disorder, unspecified: Secondary | ICD-10-CM

## 2011-08-07 DIAGNOSIS — I214 Non-ST elevation (NSTEMI) myocardial infarction: Secondary | ICD-10-CM

## 2011-08-07 LAB — TSH: TSH: 0.551 u[IU]/mL (ref 0.350–4.500)

## 2011-08-07 LAB — CARDIAC PANEL(CRET KIN+CKTOT+MB+TROPI)
CK, MB: 15.6 ng/mL (ref 0.3–4.0)
CK, MB: 19.9 ng/mL (ref 0.3–4.0)
CK, MB: 23.7 ng/mL (ref 0.3–4.0)
Relative Index: 4.2 — ABNORMAL HIGH (ref 0.0–2.5)
Relative Index: 4.4 — ABNORMAL HIGH (ref 0.0–2.5)
Relative Index: 4.6 — ABNORMAL HIGH (ref 0.0–2.5)
Total CK: 375 U/L — ABNORMAL HIGH (ref 7–232)
Total CK: 448 U/L — ABNORMAL HIGH (ref 7–232)
Total CK: 513 U/L — ABNORMAL HIGH (ref 7–232)
Troponin I: 5.39 ng/mL (ref <0.30)
Troponin I: 7.45 ng/mL (ref <0.30)

## 2011-08-07 LAB — HEPARIN LEVEL (UNFRACTIONATED): Heparin Unfractionated: <0.1 IU/mL — ABNORMAL LOW (ref 0.30–0.70)

## 2011-08-07 LAB — CBC
HCT: 40.4 % (ref 39.0–52.0)
Hemoglobin: 13.4 g/dL (ref 13.0–17.0)
MCH: 27.7 pg (ref 26.0–34.0)
MCHC: 33.2 g/dL (ref 30.0–36.0)
MCV: 83.6 fL (ref 78.0–100.0)
Platelets: 215 10*3/uL (ref 150–400)
RBC: 4.83 MIL/uL (ref 4.22–5.81)
RDW: 14.1 % (ref 11.5–15.5)
WBC: 9.5 10*3/uL (ref 4.0–10.5)

## 2011-08-07 LAB — BASIC METABOLIC PANEL
CO2: 26 mEq/L (ref 19–32)
Calcium: 8.8 mg/dL (ref 8.4–10.5)
Creatinine, Ser: 0.66 mg/dL (ref 0.50–1.35)
GFR calc Af Amer: >90 mL/min (ref >90)
GFR calc non Af Amer: >90 mL/min (ref >90)
Sodium: 138 mEq/L (ref 135–145)

## 2011-08-07 LAB — HEMOGLOBIN A1C
Hgb A1c MFr Bld: 7.5 % — ABNORMAL HIGH (ref <5.7)
Mean Plasma Glucose: 169 mg/dL — ABNORMAL HIGH (ref <117)

## 2011-08-07 MED ORDER — LISINOPRIL 10 MG PO TABS: 10.0000 mg | ORAL_TABLET | Freq: Every day | ORAL | Status: DC

## 2011-08-07 MED ORDER — CARVEDILOL 6.25 MG PO TABS
6.2500 mg | ORAL_TABLET | Freq: Two times a day (BID) | ORAL | Status: DC
  Administered 2011-08-07: 6.25 mg via ORAL
  Filled 2011-08-07 (×3): qty 1

## 2011-08-07 MED ORDER — CLOPIDOGREL BISULFATE 75 MG PO TABS: 75.0000 mg | ORAL_TABLET | Freq: Every day | ORAL | Status: DC

## 2011-08-07 MED ORDER — ATORVASTATIN CALCIUM 80 MG PO TABS: 80.0000 mg | ORAL_TABLET | Freq: Every day | ORAL | Status: DC

## 2011-08-07 MED ORDER — LISINOPRIL 10 MG PO TABS
10.0000 mg | ORAL_TABLET | Freq: Every day | ORAL | Status: DC
  Administered 2011-08-07: 10 mg via ORAL
  Filled 2011-08-07: qty 1

## 2011-08-07 MED ORDER — CARVEDILOL 6.25 MG PO TABS: 6.2500 mg | ORAL_TABLET | Freq: Two times a day (BID) | ORAL | Status: DC

## 2011-08-07 MED ORDER — NITROGLYCERIN 0.4 MG SL SUBL: 0.4000 mg | SUBLINGUAL_TABLET | SUBLINGUAL | Status: DC | PRN

## 2011-08-07 MED ORDER — ASPIRIN 81 MG PO TBEC: 81.0000 mg | DELAYED_RELEASE_TABLET | Freq: Every day | ORAL | Status: AC

## 2011-08-07 MED FILL — Dextrose Inj 5%: INTRAVENOUS | Qty: 50 | Status: AC

## 2011-08-07 NOTE — Plan of Care (Signed)
Problem: Phase II Progression Outcomes Goal: Vascular site scale level 0 - I Vascular Site Scale Level 0: No bruising/bleeding/hematoma Level I (Mild): Bruising/Ecchymosis, minimal bleeding/ooozing, palpable hematoma < 3 cm Level II (Moderate): Bleeding not affecting hemodynamic parameters, pseudoaneurysm, palpable hematoma > 3 cm  Outcome: Completed/Met Date Met:  08/07/11 Right Radial level 0

## 2011-08-07 NOTE — Progress Notes (Signed)
[name], male   DOB: [birth year], 60 y.o.   MRN: [id number] [facility]  SUBJECTIVE: Patient is doing well this morning, no chest pain.    Filed Vitals:   08/07/11 0033 08/07/11 0400 08/07/11 0500 08/07/11 0636  BP: 125/79 118/90  138/70  Pulse: 76 65  66  Temp:      TempSrc:      Resp:  16    Height:      Weight:   118.7 kg (261 lb 11 oz)   SpO2:  94%      Intake/Output Summary (Last 24 hours) at 08/07/11 0719 Last data filed at 08/07/11 0700  Gross per 24 hour  Intake    735 ml  Output      0 ml  Net    735 ml    LABS: Basic Metabolic Panel:  Basename 08/06/11 0810  NA 138  K 3.8  CL 105  CO2 23  GLUCOSE 289*  BUN 12  CREATININE 0.59  CALCIUM 9.1  MG --  PHOS --   Liver Function Tests:  [facility] 08/06/11 0810  AST 29  ALT 32  ALKPHOS 85  BILITOT 0.4  PROT 7.1  ALBUMIN 3.7   No results found for this basename: LIPASE:2,AMYLASE:2 in the last 72 hours CBC:  Basename 08/07/11 0600 08/06/11 0810  WBC 9.5 6.9  NEUTROABS -- 3.9  HGB 13.4 14.1  HCT 40.4 40.9  MCV 83.6 82.0  PLT 215 229   Cardiac Enzymes:  Basename 08/06/11 2354 08/06/11 1203 08/06/11 0810  CKTOTAL 513* 423* 464*  CKMB 23.7* 11.5* 10.2*  CKMBINDEX -- -- --  TROPONINI 7.45* 0.34* <0.30   BNP: No components found with this basename: POCBNP:3 D-Dimer: No results found for this basename: DDIMER:2 in the last 72 hours Hemoglobin A1C:  Basename 08/06/11 2034  HGBA1C 7.5*   Fasting Lipid Panel: No results found for this basename: CHOL,HDL,LDLCALC,TRIG,CHOLHDL,LDLDIRECT in the last 72 hours Thyroid Function Tests:  Basename 08/06/11 2034  TSH 0.551  T4TOTAL --  T3FREE --  THYROIDAB --   Anemia Panel: No results found for this basename: VITAMINB12,FOLATE,FERRITIN,TIBC,IRON,RETICCTPCT in the last 72 hours     . aspirin  324 mg Oral Once  . aspirin EC  81 mg Oral Daily  . bivalirudin      . bivalirudin      . clopidogrel      . clopidogrel  75  mg Oral Q breakfast  . fentaNYL      . glimepiride  4 mg Oral Q breakfast  . heparin      . heparin      . heparin      . heparin  4,000 Units Intravenous Once  . insulin aspart  0-15 Units Subcutaneous TID WC  . insulin aspart  0-5 Units Subcutaneous QHS  . lidocaine      . metoprolol tartrate  25 mg Oral Q6H  . midazolam      . multivitamins ther. w/minerals  1 tablet Oral Daily  . nitroGLYCERIN      . pantoprazole  40 mg Oral Q1200  . pneumococcal 23 valent vaccine  0.5 mL Intramuscular Tomorrow-1000  . rosuvastatin  40 mg Oral Daily  . sodium chloride  3 mL Intravenous Q12H  . verapamil      . DISCONTD: aspirin  81 mg Oral Daily  . DISCONTD: rosuvastatin  20 mg Oral Daily  . DISCONTD: sodium chloride  3 mL Intravenous Q12H  RADIOLOGY: Dg Chest Port 1 View  08/06/2011  *RADIOLOGY REPORT*  Clinical Data: Chest pain, left arm pain, nausea.  PORTABLE CHEST - 1 VIEW  Comparison: None  Findings: Mild bibasilar and perihilar opacities, likely atelectasis.  Low lung volumes.  Heart is normal size.  No effusions.  IMPRESSION: Areas of atelectasis bilaterally.  Original Report Authenticated By: Cyndie Chime, M.D.    PHYSICAL EXAM General: NAD Neck: Thick , no JVD, no thyromegaly or thyroid nodule.  Lungs: Clear to auscultation bilaterally with normal respiratory effort. CV: Nondisplaced PMI.  Heart regular S1/S2, no S3/S4, no murmur.  No peripheral edema.  No carotid bruit.  Normal pedal pulses.  Abdomen: Soft, nontender, no hepatosplenomegaly, no distention.  Neurologic: Alert and oriented x 3.  Psych: Normal affect. Extremities: No clubbing or cyanosis.   TELEMETRY: Reviewed telemetry pt in NSR.  ASSESSMENT AND PLAN: 60 yo with history of diabetes admitted with NSTEMI.  Now s/p DES to mid LAD.   1. CAD: Stable s/p PCI.  Continue ASA and Plavix, statin, beta blocker, will add ACEI.   2. Ischemic cardiomyopathy: EF 40-45% with anterior hypokinesis. Now s/p PCI.  Will get  echo today.  Will change beta blocker to Coreg and add lisinopril.  3. Dispo: Home this afternoon, followup with me in 2 weeks or so.  Needs cardiac rehab.  Cardiac meds: ASA 81, Plavix 75, Coreg 6.25 mg bid, lisinopril 10 mg daily, atorvastatin 80 mg daily.     [name] 08/07/2011 7:27 AM

## 2011-08-07 NOTE — Progress Notes (Signed)
1150- 1255 Cardiac Rehab Pt has ambulated in hall independently denies any cp or SOB. Completed MI education with pt and wife. He agrees to [employer]. CRP in [place], will send referral.

## 2011-08-07 NOTE — Discharge Summary (Signed)
Discharge Summary   Patient ID: Randy Lukes.,  MRN: [id number], DOB/AGE: 59-09-1950 59 y.o.  Admit date: 08/06/2011 Discharge date: 08/07/2011  Discharge Diagnoses Principal Problem:  *NSTEMI (non-ST elevated myocardial infarction) Active Problems:  Diabetes mellitus  Hyperlipidemia  HTN (hypertension)  DJD (degenerative joint disease)   Allergies No Known Allergies  Procedures:  Left heart catheterization, selective coronary angiography, LV angiogram and PTCA/DES x 1 LAD  Hemodynamic Findings:  Central aortic pressure: 137/77  Left ventricular pressure: 129/15/25  Angiographic Findings:  Left main: No disease.  Left Anterior Descending Artery: Just beyond the large diagonal branch there is a 99% stenosis in the mid LAD. The remainder of the vessel has TIMI 2 flow but no obstructive disease.  Circumflex Artery: Large dominant without obstructive disease.  Right Coronary Artery: Non-dominant, mild plaque.  Left Ventricular Angiogram: LVEF 40-45%. Hypokinesis of the anterior wall.  Impression:  1. Severe single vessel CAD  2. Mild to moderate LV dysfunction.  3. NSTEMI  4. Successful PTCA/DES x 1 mid LAD  2D echocardiogram with contrast Study Conclusions  - Left ventricle: Distal anterior wall, apical, septal and inferoapical hypokinesis The cavity size was mildly dilated. Wall thickness was increased in a pattern of mild LVH. The estimated ejection fraction was 40%. - Left atrium: The atrium was mildly dilated. - Atrial septum: No defect or patent foramen ovale was identified. - Pulmonary arteries: PA peak pressure: 34mm Hg (S). Echocardiography. M-mode, complete 2D, spectral Doppler, and color Doppler. Height: Height: 188cm. Height: 74in. Weight: Weight: 118.7kg. Weight: 261.1lb. Body mass index: BMI: 33.6kg/m^2. Body surface area: BSA: 2.52m^2. Blood pressure: 134/74. Patient status: Inpatient. Location:  ICU/CCU ---------------------------------------------------------- Left ventricle: Distal anterior wall, apical, septal and inferoapical hypokinesis The cavity size was mildly dilated. Wall thickness was increased in a pattern of mild LVH. The estimated ejection fraction was 40%. ------------------------------------------------------------ Aortic valve: Mildly thickened leaflets.  ------------------------------------------------------------ Aorta: Ascending aorta: The ascending aorta was mildly dilated. ------------------------------------------------------------ Mitral valve: Doppler: Trivial regurgitation. ------------------------------------------------------------ Left atrium: The atrium was mildly dilated.  ------------------------------------------------------------ Atrial septum: No defect or patent foramen ovale was identified. ------------------------------------------------------------ Right ventricle: The cavity size was normal. Wall thickness was normal. Systolic function was normal. ------------------------------------------------------------ Pulmonic valve: Doppler: Trivial regurgitation.  ------------------------------------------------------------ Tricuspid valve: Doppler: Mild regurgitation. ------------------------------------------------------------ Right atrium: The atrium was normal in size.  ------------------------------------------------------------ Pericardium: The pericardium was normal in appearance.  History of Present Illness:   Randy Castro is a 60 yo male with PMHx significant for type 2 DM, HTN and HL who was admitted to [facility] hospital with NSTEMI.   On 12/11, he reported experiencing substernal chest pain described as a "burning pressure" upon climbing ladders with associated DOE. He endorsed associated fatigue which he attributed to low blood sugar, but did not check it. His symptoms improved with rest, with some residual fatigue.   The next day, the fatigue  continued and he reported another episode of chest pain rated at a 4/10 after drinking a soda, improved with Prilosec. The pain was intermittent throughout the night preventing sleep. The pain persisted the next morning after eating breakfast, unrelieved by Prilosec with increased intensity rated at a 9-10/10. He endorsed mild dyspnea and fatigue.   Hospital Course:   He was transported to [facility] ED by his wife, received NTG SL x 2 and ASA 81mg  x 4 with improvement in chest pain. CXR revealed mild bibasilar and perihilar opacities likely due to atelectasis and low lung volumes, no cardiomegaly or effusions. EKG revealed  NSR with  lateral T wave flattening. Initial set of CEs indicated elevated CK-MB and CK. He ruled in for NSTEMI, was started on Heparin and NTG gtt, switched to SSI and admitted.   He was informed, consented and agreed to left heart catheterization with possible PCI. The procedure was performed on 12/13 eliciting the above results. Most notably, a 99% stenosis was visualized in the mid-LAD. PTCA/DES was successfully placed to this region; LVEF 40-45%; anterior wall hypokinesis. A recommendation of ASA, Plavix x 1 year was made with increase of outpatient statin.   He tolerated the procedure well and denied further complaints throughout the admission. He ambulated well with cardiac rehab. 2D echocardiogram performed today revealed the above details. Most notably, distal anterior wall, apical, septal and inferoapical hypokinesis, mildly dilated LV, mild LVH with EF 40%. He was started on ACEI and BB.   Today he is stable without chest pain, in good condition and will be discharged home. He will resume his outpatient regimen in addition to new meds prescribed this admission. An order has been placed to resume cardiac rehab outpatient. He will follow-up with Dr. Shirlee Latch early next month.   Discharge Vitals:  Blood pressure 134/74, pulse 65, temperature 97.6 F (36.4 C), temperature source Oral,  resp. rate 16, height 6\' 2"  (1.88 m), weight 118.7 kg (261 lb 11 oz), SpO2 96.00%.   Weight change:   Labs: Recent Labs  Basename 08/07/11 0600 08/06/11 0810   WBC 9.5 6.9   HGB 13.4 14.1   HCT 40.4 40.9   MCV 83.6 82.0   PLT 215 229   Lab 08/07/11 0600 08/06/11 0810  NA 138 138  K 4.3 3.8  CL 103 105  CO2 26 23  BUN 11 12  CREATININE 0.66 0.59  CALCIUM 8.8 9.1  PROT -- 7.1  BILITOT -- 0.4  ALKPHOS -- 85  ALT -- 32  AST -- 29  AMYLASE -- --  LIPASE -- --  GLUCOSE 218* 289*   Recent Labs  Basename 08/06/11 2034   HGBA1C 7.5*   Recent Labs  [facility] 08/07/11 1131 08/07/11 0600 08/06/11 2354   CKTOTAL 375* 448* 513*   CKMB 15.6* 19.9* 23.7*   CKMBINDEX -- -- --   TROPONINI 4.26* 5.39* 7.45*   No components found with this basename: POCBNP Recent Labs  Basename 08/07/11 0600   CHOL 153   HDL 31*   LDLCALC 81   TRIG 205*   CHOLHDL 4.9   LDLDIRECT --    Basename 08/06/11 2034  TSH 0.551  T4TOTAL --  T3FREE --  THYROIDAB --    Disposition:  Discharge Orders    Future Appointments: Provider: Department: Dept Phone: Center:   08/28/2011 9:00 AM Beatrice Lecher, PA [facility] [phone] [facility]   10/28/2011 8:30 AM [name], MD [facility] [phone] [facility]     Follow-up Information    Follow up with Tereso Newcomer, PA on 08/28/2011. (At 9:00 AM. You will see Tereso Newcomer for Dr. [name].)    Contact information:   [address] [place] [address] [phone]          Discharge Medications:  Current Discharge Medication List    START taking these medications   Details  aspirin EC 81 MG EC tablet Take 1 tablet (81 mg total) by mouth daily. Qty: 30 tablet, Refills: 3    carvedilol (COREG) 6.25 MG tablet Take 1 tablet (6.25 mg total) by mouth 2 (  two) times daily with a meal. Qty: 60 tablet, Refills: 3    clopidogrel (PLAVIX) 75 MG tablet Take 1 tablet (75 mg total) by mouth daily with  breakfast. Qty: 30 tablet, Refills: 3    lisinopril (PRINIVIL,ZESTRIL) 10 MG tablet Take 1 tablet (10 mg total) by mouth daily. Qty: 30 tablet, Refills: 3    nitroGLYCERIN (NITROSTAT) 0.4 MG SL tablet Place 1 tablet (0.4 mg total) under the tongue every 5 (five) minutes as needed for chest pain. Qty: 25 tablet, Refills: 3      CONTINUE these medications which have CHANGED   Details  atorvastatin (LIPITOR) 80 MG tablet Take 1 tablet (80 mg total) by mouth daily. Qty: 30 tablet, Refills: 3      CONTINUE these medications which have NOT CHANGED   Details  glimepiride (AMARYL) 4 MG tablet Take 4 mg by mouth every morning.      Multiple Vitamins-Minerals (MULTIVITAMINS THER. W/MINERALS) TABS Take 1 tablet by mouth daily.      sitaGLIPtan-metformin (JANUMET) 50-1000 MG per tablet Take 1 tablet by mouth 2 (two) times daily with a meal.      glucose blood (ONE TOUCH TEST STRIPS) test strip Use as instructed Qty: 100 each, Refills: 3   Comments: Dx:250.00    Lancets (ONETOUCH ULTRASOFT) lancets PRN Qty: 100 each, Refills: 5   Comments: Dx;250.00      STOP taking these medications     Multiple Vitamin (ONE-A-DAY MENS PO) Comments:  Reason for Stopping:          Outstanding Labs/Studies: None   Duration of Discharge Encounter: 40 minutes including physician time.  Signed, R. Hurman Horn, PA-C 08/07/2011, 2:39 PM

## 2011-08-07 NOTE — Progress Notes (Signed)
  Echocardiogram 2D Echocardiogram has been performed.  Victormanuel Mclure, Real Cons 08/07/2011, 11:39 AM

## 2011-08-07 NOTE — Progress Notes (Signed)
Nursing Note:  Pt verbalizes understanding of all discharge instructions; all questions answered; will be taken home by wife; pt transferred off unit via wheelchair to car; pt denies chest pain; vital signs stable  Ermalinda Memos, Buford Dresser

## 2011-08-10 LAB — GLUCOSE, CAPILLARY: Glucose-Capillary: 172 mg/dL — ABNORMAL HIGH (ref 70–99)

## 2011-08-28 ENCOUNTER — Encounter: Payer: Self-pay | Admitting: Physician Assistant

## 2011-08-28 ENCOUNTER — Ambulatory Visit (INDEPENDENT_AMBULATORY_CARE_PROVIDER_SITE_OTHER): Payer: BC Managed Care – PPO | Admitting: Physician Assistant

## 2011-08-28 DIAGNOSIS — I10 Essential (primary) hypertension: Secondary | ICD-10-CM

## 2011-08-28 DIAGNOSIS — I2589 Other forms of chronic ischemic heart disease: Secondary | ICD-10-CM

## 2011-08-28 DIAGNOSIS — R002 Palpitations: Secondary | ICD-10-CM

## 2011-08-28 DIAGNOSIS — I2510 Atherosclerotic heart disease of native coronary artery without angina pectoris: Secondary | ICD-10-CM

## 2011-08-28 DIAGNOSIS — E785 Hyperlipidemia, unspecified: Secondary | ICD-10-CM

## 2011-08-28 LAB — BASIC METABOLIC PANEL
CO2: 29 mEq/L (ref 19–32)
Calcium: 9 mg/dL (ref 8.4–10.5)
Creatinine, Ser: 0.8 mg/dL (ref 0.4–1.5)
Glucose, Bld: 214 mg/dL — ABNORMAL HIGH (ref 70–99)
Sodium: 142 mEq/L (ref 135–145)

## 2011-08-28 MED ORDER — CLOPIDOGREL BISULFATE 75 MG PO TABS: 75.0000 mg | ORAL_TABLET | Freq: Every day | ORAL | Status: DC

## 2011-08-28 MED ORDER — ATORVASTATIN CALCIUM 80 MG PO TABS: 80.0000 mg | ORAL_TABLET | Freq: Every day | ORAL | Status: DC

## 2011-08-28 MED ORDER — CARVEDILOL 6.25 MG PO TABS: 6.2500 mg | ORAL_TABLET | Freq: Two times a day (BID) | ORAL | Status: DC

## 2011-08-28 MED ORDER — LISINOPRIL 10 MG PO TABS: 10.0000 mg | ORAL_TABLET | Freq: Two times a day (BID) | ORAL | Status: DC

## 2011-08-28 MED ORDER — FAMOTIDINE 20 MG PO TABS: 20.0000 mg | ORAL_TABLET | Freq: Two times a day (BID) | ORAL | Status: DC | PRN

## 2011-08-28 NOTE — Assessment & Plan Note (Signed)
Doing well post NSTEMI tx with PCI to the LAD.  Continue ASA and Plavix.  He is not sure he can attend cardiac rehab.  I encouraged him to increase activity on his own.

## 2011-08-28 NOTE — Assessment & Plan Note (Signed)
Controlled.  Adjust Lisinopril as noted.

## 2011-08-28 NOTE — Patient Instructions (Signed)
Your physician has recommended you make the following change in your medication: INCREASE Lisinopril to 10mg  take one by mouth twice a day, START Pepcid 20mg  take one by mouth twice a day as needed for reflux  Your physician recommends that you have lab work: BMP today and a repeat BMP in 1 WEEK  Your physician recommends that you return for a FASTING lipid and liver profile in 6 WEEKS, nothing to eat or drink after midnight   Your physician recommends that you schedule a follow-up appointment in: 6 WEEKS with Dr Shirlee Latch  Work note provided.

## 2011-08-28 NOTE — Assessment & Plan Note (Signed)
Lipitor dose increased.  Follow up Lipids and LFTs in 6 weeks.

## 2011-08-28 NOTE — Assessment & Plan Note (Signed)
Volume stable.  Increase Lisinopril to 10 mg BID.  Check BMET today and repeat bmet in one week.  Continue same dose of coreg for now as HR in 60s.  Follow up in 6 weeks with Dr. [name].  Anticipate he will need repeat echo in 3 mos to reassess LVF.

## 2011-08-28 NOTE — Progress Notes (Signed)
[address] [place], [place]  [address] Phone: [phone] Fax:  [phone]  Date:  08/28/2011   Name:  Randy Castro.       DOB:  03/22/1951 MRN:  [id number]  PCP:  Dr. Drue Novel Primary Cardiologist:  Dr. [name]  Primary Electrophysiologist:  None    History of Present Illness: Randy Castro. is a 61 y.o. male who presents for post hospital follow up.  He has a h/o DM2, HTN, HLP.  He was admitted 12/13-12/14 with a NSTEMI.  LHC 08/06/11: mLAD 99%, ant HK with 40-45%.  PCI:  Cutting balloon PTCA + Promus DES x 1 to mid LAD.  DAPT recommended x 1 year.  2D echo 08/06/11: dist ant wall, apical and septal and infero-apical HK, mild LVH, EF 40%, mild LAE, PASP 34, asc aorta mildly dilated, mild TR.  ACEI and BB were both started for ICM.  Labs:  Hemoglobin 13.4, potassium 4.3, creatinine 0.66, ALT 32, hemoglobin A1c 7.5, TC 153, HDL 31, LDL 81, TG 205, TSH 0.551.  CXR:  Bibasilar ATX.  Doing well.  Has occasional "twinge" in chest.  No anginal symptoms.  No exertional symptoms.  Notes problems with increased indigestion.  Denies dyspnea, orthopnea, PND or edema.  No syncope or near syncope.  Had one episode of palpitations after walking.  No recurrence.    Past Medical History  Diagnosis Date  . Diabetes mellitus dx in the 90s  . Hyperlipidemia   . HTN (hypertension)   . Arthritis   . CAD (coronary artery disease)     NSTEMI 12/12: LHC 08/06/11: mLAD 99%, ant HK with 40-45%.  PCI:  Cutting balloon PTCA + Promus DES x 1 to mid LAD.  DAPT recommended x 1 year  . Ischemic cardiomyopathy     echo 08/06/11: dist ant wall, apical and septal and infero-apical HK, mild LVH, EF 40%, mild LAE, PASP 34, asc aorta mildly dilated, mild TR    Current Outpatient Prescriptions  Medication Sig Dispense Refill  . aspirin EC 81 MG EC tablet Take 1 tablet (81 mg total) by mouth daily.  30 tablet  3  . atorvastatin (LIPITOR) 80 MG tablet Take 1 tablet (80 mg total) by  mouth daily.  30 tablet  3  . carvedilol (COREG) 6.25 MG tablet Take 1 tablet (6.25 mg total) by mouth 2 (two) times daily with a meal.  60 tablet  3  . clopidogrel (PLAVIX) 75 MG tablet Take 1 tablet (75 mg total) by mouth daily with breakfast.  30 tablet  3  . glimepiride (AMARYL) 4 MG tablet Take 4 mg by mouth every morning.        Marland Kitchen glucose blood (ONE TOUCH TEST STRIPS) test strip Use as instructed  100 each  3  . Lancets (ONETOUCH ULTRASOFT) lancets PRN  100 each  5  . lisinopril (PRINIVIL,ZESTRIL) 10 MG tablet Take 1 tablet (10 mg total) by mouth daily.  30 tablet  3  . Multiple Vitamins-Minerals (MULTIVITAMINS THER. W/MINERALS) TABS Take 1 tablet by mouth daily.        . nitroGLYCERIN (NITROSTAT) 0.4 MG SL tablet Place 1 tablet (0.4 mg total) under the tongue every 5 (five) minutes as needed for chest pain.  25 tablet  3  . sitaGLIPtan-metformin (JANUMET) 50-1000 MG per tablet Take 1 tablet by mouth 2 (two) times daily with a meal.          Allergies: No Known Allergies  History  Substance Use Topics  . Smoking status: Former Smoker    Quit date: 04/12/1992  . Smokeless tobacco: Never Used   Comment: >20 years ago  . Alcohol Use: Yes     rarely      ROS:  Please see the history of present illness.   Notes recent URI symptoms.  All other systems reviewed and negative.   PHYSICAL EXAM: VS:  BP 132/60  Pulse 68  Ht 6\' 2"  (1.88 m)  Wt 258 lb 12.8 oz (117.391 kg)  BMI 33.23 kg/m2 Well nourished, well developed, in no acute distress HEENT: normal Neck: no JVD Vascular: no carotid bruits Cardiac:  normal S1, S2; RRR; no murmur Lungs:  clear to auscultation bilaterally, no wheezing, rhonchi or rales Abd: soft, nontender, no hepatomegaly Ext: no edema; right radial site without hematoma or bruit Skin: warm and dry Neuro:  CNs 2-12 intact, no focal abnormalities noted  EKG:   Sinus rhythm, heart rate 68, normal axis, nonspecific ST-T wave changes, no significant change from  prior  ASSESSMENT AND PLAN:

## 2011-08-28 NOTE — Assessment & Plan Note (Signed)
He had one episode of palpitations.  If this recurs, proceed with Holter monitor.

## 2011-09-04 ENCOUNTER — Other Ambulatory Visit (INDEPENDENT_AMBULATORY_CARE_PROVIDER_SITE_OTHER): Payer: BC Managed Care – PPO | Admitting: *Deleted

## 2011-09-04 DIAGNOSIS — I2510 Atherosclerotic heart disease of native coronary artery without angina pectoris: Secondary | ICD-10-CM

## 2011-09-04 DIAGNOSIS — I10 Essential (primary) hypertension: Secondary | ICD-10-CM

## 2011-09-04 DIAGNOSIS — I2589 Other forms of chronic ischemic heart disease: Secondary | ICD-10-CM

## 2011-09-04 DIAGNOSIS — E785 Hyperlipidemia, unspecified: Secondary | ICD-10-CM

## 2011-09-04 DIAGNOSIS — R002 Palpitations: Secondary | ICD-10-CM

## 2011-09-04 LAB — BASIC METABOLIC PANEL
BUN: 10 mg/dL (ref 6–23)
Chloride: 106 mEq/L (ref 96–112)
Creatinine, Ser: 0.8 mg/dL (ref 0.4–1.5)
Glucose, Bld: 153 mg/dL — ABNORMAL HIGH (ref 70–99)

## 2011-09-04 LAB — HEPATIC FUNCTION PANEL
ALT: 34 U/L (ref 0–53)
Albumin: 4 g/dL (ref 3.5–5.2)
Total Bilirubin: 0.6 mg/dL (ref 0.3–1.2)

## 2011-09-04 LAB — LIPID PANEL
Cholesterol: 103 mg/dL (ref 0–200)
LDL Cholesterol: 47 mg/dL (ref 0–99)
Triglycerides: 107 mg/dL (ref 0.0–149.0)

## 2011-10-07 ENCOUNTER — Ambulatory Visit (INDEPENDENT_AMBULATORY_CARE_PROVIDER_SITE_OTHER): Payer: BC Managed Care – PPO | Admitting: Cardiology

## 2011-10-07 ENCOUNTER — Encounter: Payer: Self-pay | Admitting: Cardiology

## 2011-10-07 ENCOUNTER — Encounter: Payer: Self-pay | Admitting: *Deleted

## 2011-10-07 ENCOUNTER — Other Ambulatory Visit: Payer: BC Managed Care – PPO | Admitting: *Deleted

## 2011-10-07 VITALS — BP 142/76 | HR 66 | Ht 73.0 in | Wt 253.4 lb

## 2011-10-07 DIAGNOSIS — I10 Essential (primary) hypertension: Secondary | ICD-10-CM

## 2011-10-07 DIAGNOSIS — I2510 Atherosclerotic heart disease of native coronary artery without angina pectoris: Secondary | ICD-10-CM

## 2011-10-07 DIAGNOSIS — I5022 Chronic systolic (congestive) heart failure: Secondary | ICD-10-CM

## 2011-10-07 DIAGNOSIS — I2589 Other forms of chronic ischemic heart disease: Secondary | ICD-10-CM

## 2011-10-07 DIAGNOSIS — E785 Hyperlipidemia, unspecified: Secondary | ICD-10-CM

## 2011-10-07 MED ORDER — SPIRONOLACTONE 25 MG PO TABS: ORAL_TABLET | ORAL | Status: DC

## 2011-10-07 MED ORDER — CARVEDILOL 12.5 MG PO TABS: 12.5000 mg | ORAL_TABLET | Freq: Two times a day (BID) | ORAL | Status: DC

## 2011-10-07 NOTE — Assessment & Plan Note (Signed)
NYHA class II symptoms.  EF 40% by echo after MI.  Not volume overloaded on exam.  - Continue lisinopril. - Increase Coreg to 12.5 mg bid.  - Add spironolactone 12.5 mg daily.  - BMET in 2 wks.  - echo in 3/13 to reassess EF.  - Followup in 3 months.

## 2011-10-07 NOTE — Assessment & Plan Note (Signed)
Lipids at goal in 1/13 on atorvastatin (LDL < 70).

## 2011-10-07 NOTE — Assessment & Plan Note (Signed)
Stable with occasional atypical chest pain.  No exertional symptoms.   - Continue exercise: walking on his own since cardiac rehab will not fit his work schedule.  - Continue ASA, Plavix, ACEI, beta blocker, statin.

## 2011-10-07 NOTE — Progress Notes (Signed)
PCP: Dr. Drue Novel  61 yo with history of DM and HTN had NSTEMI in 12/12.  He had DES to mid LAD.  EF was 40% with apical hypokinesis.  Patient has been doing well in general.  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  He has occasional atypical mild throbbing chest discomfort that is not clearly related to exertion.  He is able to do his job without problems.  He is mildly winded after walking up 3 flights of stairs.  He walks with his wife about 1/2 mile/day.  He is fatigued by the end of the walk (more so than before the MI).  He has been unable to do cardiac rehab due to his work schedule.   Labs (1/13): LDL 47, HDL 35, K 3.7, creatinine 1.0  ECG: NSR, normal  PMH: 1. Diabetes mellitus 2. HTN 3. Hyperlipidemia 4. H/o neck surgery 5. CAD: NSTEMI in 12/12 with 99% mid LAD stenosis treated with DES 3 x 16 Promus to mid LAD.  6. Ischemic cardiomyopathy: EF 40-45% with anterior HK on LV-gram in 12/12.  Echo (12/12) with EF 40%, apical hypokinesis, mild LVH.   SH: Married.  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  Quit smoking in 1993.  Enjoys restoring hot rods.   FH: Father with MI at 23.  Brother with CAD.   ROS: All systems reviewed and negative except as per HPI.   Current Outpatient Prescriptions  Medication Sig Dispense Refill  . aspirin EC 81 MG EC tablet Take 1 tablet (81 mg total) by mouth daily.  30 tablet  3  . atorvastatin (LIPITOR) 80 MG tablet Take 1 tablet (80 mg total) by mouth daily.  90 tablet  3  . clopidogrel (PLAVIX) 75 MG tablet Take 1 tablet (75 mg total) by mouth daily with breakfast.  90 tablet  3  . famotidine (PEPCID) 20 MG tablet Take 1 tablet (20 mg total) by mouth 2 (two) times daily as needed for heartburn.  180 tablet  3  . glimepiride (AMARYL) 4 MG tablet Take 4 mg by mouth every morning.        Marland Kitchen glucose blood (ONE TOUCH TEST STRIPS) test strip Use as instructed  100 each  3  . Lancets (ONETOUCH ULTRASOFT) lancets PRN  100 each   5  . lisinopril (PRINIVIL,ZESTRIL) 10 MG tablet Take 1 tablet (10 mg total) by mouth 2 (two) times daily.  180 tablet  3  . Multiple Vitamins-Minerals (MULTIVITAMINS THER. W/MINERALS) TABS Take 1 tablet by mouth daily.        . nitroGLYCERIN (NITROSTAT) 0.4 MG SL tablet Place 1 tablet (0.4 mg total) under the tongue every 5 (five) minutes as needed for chest pain.  25 tablet  3  . sitaGLIPtan-metformin (JANUMET) 50-1000 MG per tablet Take 1 tablet by mouth 2 (two) times daily with a meal.        . DISCONTD: carvedilol (COREG) 6.25 MG tablet Take 1 tablet (6.25 mg total) by mouth 2 (two) times daily with a meal.  180 tablet  3  . carvedilol (COREG) 12.5 MG tablet Take 1 tablet (12.5 mg total) by mouth 2 (two) times daily.  180 tablet  3  . spironolactone (ALDACTONE) 25 MG tablet Take 1/2 (total 12.5mg ) daily  15 tablet  6  . DISCONTD: spironolactone (ALDACTONE) 25 MG tablet 1/2  tablet (total 12.5mg ) daily  45 tablet  3    BP 142/76  Pulse 66  Ht 6\' 1"  (  1.854 m)  Wt 114.941 kg (253 lb 6.4 oz)  BMI 33.43 kg/m2 General: NAD Neck: Thick, no JVD, no thyromegaly or thyroid nodule.  Lungs: Clear to auscultation bilaterally with normal respiratory effort. CV: Nondisplaced PMI.  Heart regular S1/S2, no S3/S4, no murmur.  No peripheral edema.  No carotid bruit.  Normal pedal pulses.  Abdomen: Soft, nontender, no hepatosplenomegaly, no distention.  Neurologic: Alert and oriented x 3.  Psych: Normal affect. Extremities: No clubbing or cyanosis.

## 2011-10-07 NOTE — Assessment & Plan Note (Signed)
BP mildly elevated today.  As above, increasing Coreg.

## 2011-10-07 NOTE — Patient Instructions (Addendum)
Your physician has requested that you have an echocardiogram. Echocardiography is a painless test that uses sound waves to create images of your heart. It provides your doctor with information about the size and shape of your heart and how well your heart's chambers and valves are working. This procedure takes approximately one hour. There are no restrictions for this procedure.   End of March 2013  Your physician recommends that you return for lab work in: 2 weeks--BMET you do not need lab today.  Increase coreg(carvediolol) to 12.5mg  twice a day. You can take two 6.25mg  tabelts twice a day.   Start spironolactone 12.5mg  daily. This will be one-half 25mg  tablet daily.   Your physician recommends that you schedule a follow-up appointment in: 3 months with Dr Shirlee Latch.

## 2011-10-09 ENCOUNTER — Other Ambulatory Visit: Payer: BC Managed Care – PPO | Admitting: *Deleted

## 2011-10-13 ENCOUNTER — Telehealth: Payer: Self-pay | Admitting: Cardiology

## 2011-10-13 NOTE — Telephone Encounter (Signed)
Pt's needs a more detailed rtn to work note to say can rtn to work w/o restrictions or if still has restriction of no heavy lifting,  needs weight limit, fax to  [phone], pls call wife [phone] if any questions/problems

## 2011-10-13 NOTE — Telephone Encounter (Signed)
Dr Shirlee Latch recommended pt lift 50 pounds or less. Discussed with pt's wife. Will fax the letter from Sidonie Dickens, maintenance manager with this information to pt at [phone].

## 2011-10-19 ENCOUNTER — Other Ambulatory Visit (INDEPENDENT_AMBULATORY_CARE_PROVIDER_SITE_OTHER): Payer: BC Managed Care – PPO

## 2011-10-19 DIAGNOSIS — I2510 Atherosclerotic heart disease of native coronary artery without angina pectoris: Secondary | ICD-10-CM

## 2011-10-19 DIAGNOSIS — I5022 Chronic systolic (congestive) heart failure: Secondary | ICD-10-CM

## 2011-10-19 LAB — BASIC METABOLIC PANEL
Calcium: 9.2 mg/dL (ref 8.4–10.5)
Creatinine, Ser: 0.7 mg/dL (ref 0.4–1.5)
GFR: 114.61 mL/min (ref >60.00)
Glucose, Bld: 164 mg/dL — ABNORMAL HIGH (ref 70–99)
Sodium: 140 mEq/L (ref 135–145)

## 2011-10-21 ENCOUNTER — Other Ambulatory Visit: Payer: Self-pay | Admitting: *Deleted

## 2011-10-28 ENCOUNTER — Encounter: Payer: Self-pay | Admitting: Internal Medicine

## 2011-10-28 ENCOUNTER — Ambulatory Visit (INDEPENDENT_AMBULATORY_CARE_PROVIDER_SITE_OTHER): Payer: BC Managed Care – PPO | Admitting: Internal Medicine

## 2011-10-28 DIAGNOSIS — R21 Rash and other nonspecific skin eruption: Secondary | ICD-10-CM | POA: Insufficient documentation

## 2011-10-28 DIAGNOSIS — Z0000 Encounter for general adult medical examination without abnormal findings: Secondary | ICD-10-CM | POA: Insufficient documentation

## 2011-10-28 DIAGNOSIS — L989 Disorder of the skin and subcutaneous tissue, unspecified: Secondary | ICD-10-CM

## 2011-10-28 DIAGNOSIS — E119 Type 2 diabetes mellitus without complications: Secondary | ICD-10-CM

## 2011-10-28 MED ORDER — GLIMEPIRIDE 4 MG PO TABS: 4.0000 mg | ORAL_TABLET | ORAL | Status: DC

## 2011-10-28 MED ORDER — CLOTRIMAZOLE-BETAMETHASONE 1-0.05 % EX CREA: TOPICAL_CREAM | Freq: Two times a day (BID) | CUTANEOUS | Status: DC | PRN

## 2011-10-28 NOTE — Progress Notes (Signed)
  Subjective:    [name], male    DOB: Mar 11, 1951, 61 y.o.   MRN: [id number]  HPI Complete physical exam Since the last time I saw the patient, he had an MI, past medical history updated, chart reviewed. Doing well  Past medical history Diabetes Hypertension Hyperlipidemia CV: ---NSTEMI 12-12, PCI, EF 40% ---Ischemic cardiomyopathy  Past surgical history Neck surgery    Social history Moved back [facility] [place] from [facility] 2011  Married, 2 sonsJob-- maintenance tech Tobacco-- quit years ago ETOH-- rarely  Exercise-- walks ~ 30 min a day Diet-- a lot better since MI   Family history Diabetes--M, sister, brother  CAD-- F MI at age 15  Stroke-- no Colon cancer--no Prostate cancer--no   Review of Systems No further chest pain or shortness of breath No nausea, vomiting, diarrhea. No blood in the stools. Medication list reviewed, good compliance. amb CBGs well control, twice a month his CBGs dropps to the 70s and he gets a little shaky. Episodes usually related to not eating. He is getting better, not skipping meals, he does carry some sugar with him. No dysuria or gross hematuria.     Objective:   Physical Exam  Constitutional: He is oriented to person, place, and time. He appears well-developed. No distress.  HENT:  Head: Normocephalic and atraumatic.  Neck: No thyromegaly present.  Cardiovascular: Normal rate, regular rhythm and normal heart sounds.   No murmur heard. Pulmonary/Chest: Effort normal and breath sounds normal. No respiratory distress. He has no wheezes. He has no rales.  Abdominal: Soft. He exhibits no distension. There is no tenderness. There is no rebound and no guarding.  Genitourinary: Rectum normal and prostate normal. Guaiac negative stool.  Musculoskeletal: He exhibits no edema.  Neurological: He is alert and oriented to person, place, and time.  Skin: He is not diaphoretic.       R face has a skin colored , chronic appearing lesion ~  4 mm   Psychiatric: He has a normal mood and affect. His behavior is normal. Judgment and thought content normal.      Assessment & Plan:

## 2011-10-28 NOTE — Assessment & Plan Note (Signed)
occ rash in the summer time, needs a Rx for lotrisone: done

## 2011-10-28 NOTE — Assessment & Plan Note (Signed)
Td < 10 years per pt Pneumonia shot 12-12 Never had a cscope , will discuss Cscope next year (just recovering from a MI). iFOB provided Labs Cont w/his healthier life style Skin lesion, R face----- refer to derm

## 2011-10-28 NOTE — Assessment & Plan Note (Addendum)
MI 12-12, since then doing better w/life style occ low sugar, see ROS. Encouraged to call if has more frecuent episodes of low sugar Labs a1c goal << 7.0

## 2011-11-04 ENCOUNTER — Ambulatory Visit: Payer: BC Managed Care – PPO | Admitting: Internal Medicine

## 2011-11-12 ENCOUNTER — Ambulatory Visit (INDEPENDENT_AMBULATORY_CARE_PROVIDER_SITE_OTHER): Payer: BC Managed Care – PPO | Admitting: Internal Medicine

## 2011-11-12 DIAGNOSIS — E119 Type 2 diabetes mellitus without complications: Secondary | ICD-10-CM

## 2011-11-12 MED ORDER — GLIMEPIRIDE 4 MG PO TABS: 8.0000 mg | ORAL_TABLET | ORAL | Status: DC

## 2011-11-12 NOTE — Progress Notes (Signed)
  Subjective:    Patient ID: Randy Castro., male    DOB: 1951/07/29, 61 y.o.   MRN: [id number]  HPI Here to discuss A1C  Past medical history  Diabetes  Hypertension  Hyperlipidemia  CV:  ---NSTEMI 12-12, PCI, EF 40%  ---Ischemic cardiomyopathy  Past surgical history  Neck surgery      Review of Systems     Objective:   Physical Exam A, Ox3       Assessment & Plan:  Today , I spent more than 15 min with the patient, >50% of the time counseling

## 2011-11-12 NOTE — Patient Instructions (Addendum)
Increase glimepiride  to 2 tablets in the morning. Watch for low blood sugars. If your blood sugar is less than 110 or you have low blood sugar symptoms, cut down to 1.5 tablet in the morning ---------------------------------- Please do  blood workup one week before your next visit (in 9 weeks) -----hemoglobin A1c, dx diabetes

## 2011-11-15 ENCOUNTER — Encounter: Payer: Self-pay | Admitting: Internal Medicine

## 2011-11-15 NOTE — Assessment & Plan Note (Signed)
DM: Here with his wife to discuss his most recent A1c which is > 7, I talked to them about the need to get the A1c below 7 to decrease the risk of future cardiovascular events. In addition to working on his diet and exercise, his options are Insulin vyctoza Increase Amaryl. The patient is reluctant to start injectables, consequently will increase Amaryl to 8 mg daily, warned about  Hypoglycemia. He seems to be committed to improve his lifestyle. Followup in 10 weeks.

## 2011-11-16 ENCOUNTER — Ambulatory Visit (HOSPITAL_COMMUNITY): Payer: BC Managed Care – PPO | Attending: Internal Medicine

## 2011-11-16 ENCOUNTER — Other Ambulatory Visit: Payer: Self-pay

## 2011-11-16 DIAGNOSIS — I2589 Other forms of chronic ischemic heart disease: Secondary | ICD-10-CM | POA: Insufficient documentation

## 2011-11-16 DIAGNOSIS — E119 Type 2 diabetes mellitus without complications: Secondary | ICD-10-CM | POA: Insufficient documentation

## 2011-11-16 DIAGNOSIS — I5022 Chronic systolic (congestive) heart failure: Secondary | ICD-10-CM

## 2011-11-16 DIAGNOSIS — I2510 Atherosclerotic heart disease of native coronary artery without angina pectoris: Secondary | ICD-10-CM

## 2011-11-16 DIAGNOSIS — I10 Essential (primary) hypertension: Secondary | ICD-10-CM | POA: Insufficient documentation

## 2011-11-16 DIAGNOSIS — I509 Heart failure, unspecified: Secondary | ICD-10-CM | POA: Insufficient documentation

## 2012-01-12 ENCOUNTER — Other Ambulatory Visit (INDEPENDENT_AMBULATORY_CARE_PROVIDER_SITE_OTHER): Payer: BC Managed Care – PPO

## 2012-01-12 DIAGNOSIS — E119 Type 2 diabetes mellitus without complications: Secondary | ICD-10-CM

## 2012-01-12 LAB — HEMOGLOBIN A1C: Hgb A1c MFr Bld: 7 % — ABNORMAL HIGH (ref 4.6–6.5)

## 2012-01-12 NOTE — Progress Notes (Signed)
Labs only

## 2012-01-13 ENCOUNTER — Encounter: Payer: Self-pay | Admitting: Cardiology

## 2012-01-13 ENCOUNTER — Ambulatory Visit (INDEPENDENT_AMBULATORY_CARE_PROVIDER_SITE_OTHER): Payer: BC Managed Care – PPO | Admitting: Cardiology

## 2012-01-13 ENCOUNTER — Other Ambulatory Visit: Payer: BC Managed Care – PPO

## 2012-01-13 VITALS — BP 132/74 | HR 67 | Ht 74.0 in | Wt 254.0 lb

## 2012-01-13 DIAGNOSIS — E785 Hyperlipidemia, unspecified: Secondary | ICD-10-CM

## 2012-01-13 DIAGNOSIS — I2510 Atherosclerotic heart disease of native coronary artery without angina pectoris: Secondary | ICD-10-CM

## 2012-01-13 DIAGNOSIS — I2589 Other forms of chronic ischemic heart disease: Secondary | ICD-10-CM

## 2012-01-13 NOTE — Assessment & Plan Note (Addendum)
EF has returned to normal, 55-60% on 3/13 echo.  Continue current doses of Coreg, lisinopril, and spironolactone for now.  Will get a BMET.

## 2012-01-13 NOTE — Progress Notes (Signed)
PCP: Dr. Drue Novel  61 yo with history of DM and HTN had NSTEMI in 12/12.  He had DES to mid LAD.  EF was 40% with apical hypokinesis at the time of the MI.  Patient has been doing well in general.  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  No chest pain.  He is able to do his job without problems.  He is mildly winded after walking up 3 flights of stairs.  He walks with his wife about 1/2 mile/day.  Repeat echo in 3/13 showed recovery of LV systolic function, EF 55-60%.    Labs (1/13): LDL 47, HDL 35, K 3.7, creatinine 1.0 Labs (2/13): K 4.1, creatinine 0.7  ECG: NSR, normal  PMH: 1. Diabetes mellitus 2. HTN 3. Hyperlipidemia 4. H/o neck surgery 5. CAD: NSTEMI in 12/12 with 99% mid LAD stenosis treated with DES 3 x 16 Promus to mid LAD.  6. Ischemic cardiomyopathy: EF 40-45% with anterior HK on LV-gram in 12/12.  Echo (12/12) with EF 40%, apical hypokinesis, mild LVH.  Echo (3/13) showed recovery of LV systolic function with EF 55-60%, grade I diastolic dysfunction, mild MR.   SH: Married, lives in [place].  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  Quit smoking in 1993.  Enjoys restoring hot rods.   FH: Father with MI at 36.  Brother with CAD.    Current Outpatient Prescriptions  Medication Sig Dispense Refill  . aspirin EC 81 MG EC tablet Take 1 tablet (81 mg total) by mouth daily.  30 tablet  3  . atorvastatin (LIPITOR) 80 MG tablet Take 1 tablet (80 mg total) by mouth daily.  90 tablet  3  . carvedilol (COREG) 12.5 MG tablet Take 1 tablet (12.5 mg total) by mouth 2 (two) times daily.  180 tablet  3  . clopidogrel (PLAVIX) 75 MG tablet Take 1 tablet (75 mg total) by mouth daily with breakfast.  90 tablet  3  . clotrimazole-betamethasone (LOTRISONE) cream Apply topically 2 (two) times daily as needed.  30 g  1  . famotidine (PEPCID) 20 MG tablet Take 1 tablet (20 mg total) by mouth 2 (two) times daily as needed for heartburn.  180 tablet  3  . glimepiride  (AMARYL) 4 MG tablet Take 2 tablets (8 mg total) by mouth every morning.  180 tablet  0  . glucose blood (ONE TOUCH TEST STRIPS) test strip Use as instructed  100 each  3  . Lancets (ONETOUCH ULTRASOFT) lancets PRN  100 each  5  . lisinopril (PRINIVIL,ZESTRIL) 10 MG tablet Take 1 tablet (10 mg total) by mouth 2 (two) times daily.  180 tablet  3  . Multiple Vitamins-Minerals (MULTIVITAMINS THER. W/MINERALS) TABS Take 1 tablet by mouth daily.        . nitroGLYCERIN (NITROSTAT) 0.4 MG SL tablet Place 1 tablet (0.4 mg total) under the tongue every 5 (five) minutes as needed for chest pain.  25 tablet  3  . sitaGLIPtan-metformin (JANUMET) 50-1000 MG per tablet Take 1 tablet by mouth 2 (two) times daily with a meal.        . spironolactone (ALDACTONE) 25 MG tablet Take 1/2 (total 12.5mg ) daily  15 tablet  6    BP 132/74  Pulse 67  Ht 6\' 2"  (1.88 m)  Wt 254 lb (115.214 kg)  BMI 32.61 kg/m2  SpO2 97% General: NAD Neck: Thick, no JVD, no thyromegaly or thyroid nodule.  Lungs: Clear to auscultation bilaterally with  normal respiratory effort. CV: Nondisplaced PMI.  Heart regular S1/S2, no S3/S4, no murmur.  No peripheral edema.  No carotid bruit.  Normal pedal pulses.  Abdomen: Soft, nontender, no hepatosplenomegaly, no distention.  Neurologic: Alert and oriented x 3.  Psych: Normal affect. Extremities: No clubbing or cyanosis.

## 2012-01-13 NOTE — Patient Instructions (Signed)
Your physician recommends that you continue on your current medications as directed. Please refer to the Current Medication list given to you today.  Your physician recommends that you return for lab work on Friday May 24th for fasting labs lipid,cmp  Your physician wants you to follow-up in: 6 months with Dr. Shirlee Latch. You will receive a reminder letter in the mail two months in advance. If you don't receive a letter, please call our office to schedule the follow-up appointment.

## 2012-01-13 NOTE — Assessment & Plan Note (Signed)
No ischemic symptoms.  Continue ASA, Plavix, ACEI, beta blocker, statin.

## 2012-01-13 NOTE — Assessment & Plan Note (Signed)
Check lipids with goal LDL < 70.   

## 2012-01-14 ENCOUNTER — Other Ambulatory Visit: Payer: BC Managed Care – PPO

## 2012-01-15 ENCOUNTER — Encounter: Payer: Self-pay | Admitting: *Deleted

## 2012-01-15 ENCOUNTER — Other Ambulatory Visit (INDEPENDENT_AMBULATORY_CARE_PROVIDER_SITE_OTHER): Payer: BC Managed Care – PPO

## 2012-01-15 DIAGNOSIS — E785 Hyperlipidemia, unspecified: Secondary | ICD-10-CM

## 2012-01-15 LAB — HEPATIC FUNCTION PANEL
ALT: 32 U/L (ref 0–53)
Total Protein: 6.6 g/dL (ref 6.0–8.3)

## 2012-01-15 LAB — BASIC METABOLIC PANEL
GFR: 101.73 mL/min (ref >60.00)
Potassium: 4.5 mEq/L (ref 3.5–5.1)
Sodium: 140 mEq/L (ref 135–145)

## 2012-01-15 LAB — LIPID PANEL
Cholesterol: 103 mg/dL (ref 0–200)
HDL: 33.9 mg/dL — ABNORMAL LOW (ref >39.00)
VLDL: 29.6 mg/dL (ref 0.0–40.0)

## 2012-01-19 ENCOUNTER — Telehealth: Payer: Self-pay | Admitting: *Deleted

## 2012-01-19 NOTE — Telephone Encounter (Signed)
Message copied by Burnell Blanks on Tue Jan 19, 2012 10:15 AM ------      Message from: Laurey Morale      Created: Fri Jan 15, 2012  5:13 PM       Good lipids

## 2012-01-19 NOTE — Telephone Encounter (Signed)
Mailed copy of labs and left message with wife to call if any questions  

## 2012-01-22 ENCOUNTER — Ambulatory Visit (INDEPENDENT_AMBULATORY_CARE_PROVIDER_SITE_OTHER): Payer: BC Managed Care – PPO | Admitting: Internal Medicine

## 2012-01-22 VITALS — BP 140/84 | HR 60 | Temp 97.8°F | Wt 258.0 lb

## 2012-01-22 DIAGNOSIS — I10 Essential (primary) hypertension: Secondary | ICD-10-CM

## 2012-01-22 DIAGNOSIS — E785 Hyperlipidemia, unspecified: Secondary | ICD-10-CM

## 2012-01-22 DIAGNOSIS — E119 Type 2 diabetes mellitus without complications: Secondary | ICD-10-CM

## 2012-01-22 LAB — HEMOGLOBIN A1C: Hgb A1c MFr Bld: 7.1 % — ABNORMAL HIGH (ref 4.6–6.5)

## 2012-01-22 MED ORDER — SITAGLIPTIN PHOS-METFORMIN HCL 50-1000 MG PO TABS: 1.0000 | ORAL_TABLET | Freq: Two times a day (BID) | ORAL | Status: DC

## 2012-01-22 NOTE — Assessment & Plan Note (Signed)
Since the last visit, we increased Amaryl. Occasionally has low blood sugars, see HPI. He is improving his lifestyle. Plan: A1c Advise not to skip any meals, always carry crackers with him in case he has to be late for a meal.

## 2012-01-22 NOTE — Progress Notes (Signed)
  Subjective:    [name], male    DOB: [birth year], 61 y.o.   MRN: [id number]  HPI Routine office visit Good medication compliance with all diabetic medicines. He is doing better with diet and exercise, is walking approximately 30 minutes every other day.   Past medical history   Diabetes   Hypertension   Hyperlipidemia   CV:  ---NSTEMI 12-12, PCI, EF 40%   ---Ischemic cardiomyopathy    Past surgical history   Neck surgery     Review of Systems Ambulatory blood sugars around 110, occasionally it drops to 60 and he feels shaky and sweaty. This problem is usually associated with skipping a meal or eating late. Episodes are random, not necessary at night or any specific time of the day. He also reports leg cramps improved by taking over-the-counter Mg.     Objective:   Physical Exam General -- alert, well-developed. No apparent distress.  Lungs -- normal respiratory effort, no intercostal retractions, no accessory muscle use, and normal breath sounds.   Heart-- normal rate, regular rhythm, no murmur, and no gallop.   Extremities-- no pretibial edema bilaterally  Neurologic-- alert & oriented X3 and strength normal in all extremities. Psych-- Cognition and judgment appear intact. Alert and cooperative with normal attention span and concentration.  not anxious appearing and not depressed appearing.      Assessment & Plan:

## 2012-01-22 NOTE — Patient Instructions (Signed)
Don't skip meals Try to exercise daily Always carry peanut butter crackers or crackers with you in case you have to be late for a meal. If the low blood sugar symptoms are severe or more often than twice a week, let me know

## 2012-01-24 ENCOUNTER — Encounter: Payer: Self-pay | Admitting: Internal Medicine

## 2012-01-24 NOTE — Assessment & Plan Note (Signed)
Well controlled 

## 2012-01-24 NOTE — Assessment & Plan Note (Signed)
No change 

## 2012-01-28 ENCOUNTER — Ambulatory Visit: Payer: BC Managed Care – PPO | Admitting: Internal Medicine

## 2012-04-05 ENCOUNTER — Telehealth: Payer: Self-pay | Admitting: *Deleted

## 2012-04-05 NOTE — Telephone Encounter (Signed)
Pt wife called to advise that the [place] is now too expensive, please advise any other medication that can help pt

## 2012-04-05 NOTE — Telephone Encounter (Signed)
We can provide samples until Dr Drue Novel returns and decides what the next steps are

## 2012-04-06 NOTE — Telephone Encounter (Signed)
Spoke to pt to advise results/instructions. Pt understood.Samples placed up front for pick up Pt will pick up the samples tomorrow and call back after 04-14-12 to discuss with MD Paz/assistant the next steps

## 2012-04-08 NOTE — Telephone Encounter (Signed)
Pt wife call back stating that she needs clarification on how to take samples since they are different mg. Per Pt wife Pt was given Januvia 100 mg however the Pt is not on this med and should be taking the Janumet 50/1000. Pt wife advise that this is the wrong med and Pt is not to take this will place correct samples up front for pick up. Apologized to Pt wife for the mix up. Pt wife,ok and will pick up correct samples today and will discard the januvia that was given in error.

## 2012-04-26 ENCOUNTER — Ambulatory Visit (INDEPENDENT_AMBULATORY_CARE_PROVIDER_SITE_OTHER): Payer: BC Managed Care – PPO | Admitting: Internal Medicine

## 2012-04-26 VITALS — BP 142/82 | HR 58 | Temp 97.6°F | Wt 261.0 lb

## 2012-04-26 DIAGNOSIS — E785 Hyperlipidemia, unspecified: Secondary | ICD-10-CM

## 2012-04-26 DIAGNOSIS — R5383 Other fatigue: Secondary | ICD-10-CM

## 2012-04-26 DIAGNOSIS — E118 Type 2 diabetes mellitus with unspecified complications: Secondary | ICD-10-CM

## 2012-04-26 DIAGNOSIS — I10 Essential (primary) hypertension: Secondary | ICD-10-CM

## 2012-04-26 DIAGNOSIS — R5381 Other malaise: Secondary | ICD-10-CM

## 2012-04-26 LAB — CBC WITH DIFFERENTIAL/PLATELET
Basophils Relative: 0.9 % (ref 0.0–3.0)
Hemoglobin: 13.3 g/dL (ref 13.0–17.0)
Lymphocytes Relative: 27.2 % (ref 12.0–46.0)
MCHC: 33.5 g/dL (ref 30.0–36.0)
Monocytes Relative: 7.6 % (ref 3.0–12.0)
Neutro Abs: 4.3 10*3/uL (ref 1.4–7.7)
RBC: 4.66 Mil/uL (ref 4.22–5.81)

## 2012-04-26 NOTE — Assessment & Plan Note (Addendum)
Complains of fatigue, snoring and falling asleep easy, sleep apnea? Plan: Check a CBC, refer for a sleep study Addendum, declined to be refer for a sleep study, "I will never be able to wear a mask". We agreed that he will work on his diet, exercise and weight loss. Reassess on return to the office.

## 2012-04-26 NOTE — Assessment & Plan Note (Addendum)
Last A1c 7.1, goal is less than 7. Good compliance with medication, however is unable to afford janumet XR ($270 a month !) There is a lot of room for improvement on his diet and exercise, I counseled him about this issue. Part of the problem is he can't exercise due to fatigue. See below. Plan: Samples of janumet x 1 month, patient will explore other options, see instructions. Check the A1c, if not at goal, patient knows he will need insulin.

## 2012-04-26 NOTE — Assessment & Plan Note (Addendum)
At goal. Had cramps, much better with Mg, wonders if he is taking too much. Will check a magnesium level

## 2012-04-26 NOTE — Patient Instructions (Signed)
Check the  blood pressure 2 or 3 times a week, be sure it is between 110/60 and 140/85. If it is consistently higher or lower, let me know See if your insurance will cover onglyza or tradjenta  better than janumet

## 2012-04-26 NOTE — Assessment & Plan Note (Signed)
Goals discuss, see instructions .

## 2012-04-26 NOTE — Progress Notes (Signed)
  Subjective:    Patient ID: Randy Castro., male    DOB: Sep 13, 1950, 61 y.o.   MRN: [id number]  HPI Here with his wife Diabetes, unable to afford janumet, he is taking Janumet XR samples, has not miss a dose. Amaryl was increased to 8 mg daily but couldn't tolerate due to lightheadedness and dizziness, currently taking 6 mg daily. Ambulatory CBGs around 160 High cholesterol, good medication compliance, complained about leg cramps when he went to cardiology, taking 2 capsules of Mg daily, cramps resolved. Hypertension, good medication compliance, not ambulatory BPs.  Past medical history   Diabetes   Hypertension   Hyperlipidemia   CV:  ---NSTEMI 12-12, PCI, EF 40%   ---Ischemic cardiomyopathy    Past surgical history   Neck surgery    Social history Moved back to [place] from [facility]   Married, 2 sonsJob-- maintenance tech Tobacco-- quit years ago ETOH-- rarely   Exercise-- walks ~ 30 min a day Diet-- a lot better since MI   Family history Diabetes--M, sister, brother   CAD-- F MI at age 11   Stroke-- no Colon cancer--no Prostate cancer--no   Review of Systems No chest pain or shortness of breath. No dyspnea on exertion. No lower extremity edema. No nausea, vomiting, diarrhea. Admits to fatigue, able to do his Job well however at the end of the day his extremity tired and unable to exercise. When asked, admits that he could fall sleep easy. He also snores. Diet, lots of room for improvement.    Objective:   Physical Exam General -- alert, well-developed, and overweight appearing. No apparent distress.  Lungs -- normal respiratory effort, no intercostal retractions, no accessory muscle use, and normal breath sounds.   Heart-- normal rate, regular rhythm, no murmur, and no gallop.   Extremities-- no pretibial edema bilaterally  Neurologic-- alert & oriented X3 and strength normal in all extremities. Psych-- Cognition and judgment appear intact. Alert and cooperative  with normal attention span and concentration.  not anxious appearing and not depressed appearing.       Assessment & Plan:

## 2012-04-29 ENCOUNTER — Encounter: Payer: Self-pay | Admitting: *Deleted

## 2012-05-08 ENCOUNTER — Other Ambulatory Visit: Payer: Self-pay | Admitting: Cardiology

## 2012-05-09 ENCOUNTER — Telehealth: Payer: Self-pay | Admitting: Internal Medicine

## 2012-05-09 MED ORDER — GLIMEPIRIDE 4 MG PO TABS: 8.0000 mg | ORAL_TABLET | ORAL | Status: DC

## 2012-05-09 NOTE — Telephone Encounter (Signed)
Refill done.  

## 2012-05-09 NOTE — Telephone Encounter (Signed)
Refill: Glimepiride tab 4mg . Take 2 by mouth every morning. 90 day supply

## 2012-05-13 MED ORDER — GLIMEPIRIDE 4 MG PO TABS: 8.0000 mg | ORAL_TABLET | ORAL | Status: DC

## 2012-05-13 NOTE — Telephone Encounter (Signed)
rx resent to correct pharmacy 

## 2012-05-13 NOTE — Addendum Note (Signed)
Addended by: Edwena Felty T on: 05/13/2012 04:34 PM   Modules accepted: Orders

## 2012-05-16 ENCOUNTER — Ambulatory Visit: Payer: BC Managed Care – PPO | Admitting: Internal Medicine

## 2012-07-18 ENCOUNTER — Ambulatory Visit (INDEPENDENT_AMBULATORY_CARE_PROVIDER_SITE_OTHER): Payer: BC Managed Care – PPO | Admitting: Cardiology

## 2012-07-18 ENCOUNTER — Encounter: Payer: Self-pay | Admitting: Cardiology

## 2012-07-18 VITALS — BP 141/75 | HR 62 | Ht 74.0 in | Wt 262.0 lb

## 2012-07-18 DIAGNOSIS — I2510 Atherosclerotic heart disease of native coronary artery without angina pectoris: Secondary | ICD-10-CM

## 2012-07-18 DIAGNOSIS — I2589 Other forms of chronic ischemic heart disease: Secondary | ICD-10-CM

## 2012-07-18 DIAGNOSIS — E785 Hyperlipidemia, unspecified: Secondary | ICD-10-CM

## 2012-07-18 LAB — BASIC METABOLIC PANEL
CO2: 29 mEq/L (ref 19–32)
Chloride: 100 mEq/L (ref 96–112)
Glucose, Bld: 191 mg/dL — ABNORMAL HIGH (ref 70–99)
Potassium: 4.3 mEq/L (ref 3.5–5.1)
Sodium: 137 mEq/L (ref 135–145)

## 2012-07-18 NOTE — Progress Notes (Signed)
Patient ID: Randy Castro., male   DOB: 1950/10/21, 61 y.o.   MRN: [id number] PCP: Dr. Drue Novel  61 yo with history of DM and HTN had NSTEMI in 12/12.  He had DES to mid LAD.  EF was 40% with apical hypokinesis at the time of the MI.  Patient has been doing well in general.  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  No chest pain.  He is able to do his job without problems.  He is mildly winded after walking up 3 flights of stairs.  He has not been walking as much for exercise as in the past due to knee pain.  Weight is up 6 lbs.  Repeat echo in 3/13 showed recovery of LV systolic function, EF 55-60%.    Labs (1/13): LDL 47, HDL 35, K 3.7, creatinine 1.0 Labs (2/13): K 4.1, creatinine 0.7 Labs (5/13): K 4.5, creatinine 0.8, LDL 40, HDL 34  ECG: NSR, normal except for PAC  PMH: 1. Diabetes mellitus 2. HTN 3. Hyperlipidemia 4. H/o neck surgery 5. CAD: NSTEMI in 12/12 with 99% mid LAD stenosis treated with DES 3 x 16 Promus to mid LAD.  6. Ischemic cardiomyopathy: EF 40-45% with anterior HK on LV-gram in 12/12.  Echo (12/12) with EF 40%, apical hypokinesis, mild LVH.  Echo (3/13) showed recovery of LV systolic function with EF 55-60%, grade I diastolic dysfunction, mild MR.   SH: Married, lives in [place].  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  Quit smoking in 1993.  Enjoys restoring hot rods.   FH: Father with MI at 55.  Brother with CAD.   Current Outpatient Prescriptions  Medication Sig Dispense Refill  . aspirin EC 81 MG EC tablet Take 1 tablet (81 mg total) by mouth daily.  30 tablet  3  . atorvastatin (LIPITOR) 80 MG tablet Take 1 tablet (80 mg total) by mouth daily.  90 tablet  3  . carvedilol (COREG) 12.5 MG tablet Take 1 tablet (12.5 mg total) by mouth 2 (two) times daily.  180 tablet  3  . clopidogrel (PLAVIX) 75 MG tablet Take 1 tablet (75 mg total) by mouth daily with breakfast.  90 tablet  3  . clotrimazole-betamethasone (LOTRISONE) cream Apply  topically 2 (two) times daily as needed.  30 g  1  . famotidine (PEPCID) 20 MG tablet Take 1 tablet (20 mg total) by mouth 2 (two) times daily as needed for heartburn.  180 tablet  3  . glimepiride (AMARYL) 4 MG tablet Take 2 tablets (8 mg total) by mouth every morning.  180 tablet  1  . lisinopril (PRINIVIL,ZESTRIL) 10 MG tablet Take 1 tablet (10 mg total) by mouth 2 (two) times daily.  180 tablet  3  . MAGNESIUM PO Take 2 tablets by mouth daily.       . Multiple Vitamins-Minerals (MULTIVITAMINS THER. W/MINERALS) TABS Take 1 tablet by mouth daily.        . nitroGLYCERIN (NITROSTAT) 0.4 MG SL tablet Place 1 tablet (0.4 mg total) under the tongue every 5 (five) minutes as needed for chest pain.  25 tablet  3  . SitaGLIPtin-MetFORMIN HCl (JANUMET XR) 50-1000 MG TB24 Take 1 tablet by mouth 2 (two) times daily.      Randy Castro spironolactone (ALDACTONE) 25 MG tablet TAKE 1/2 TABLET DAILY  15 tablet  2   BP 141/75  Pulse 62  Ht 6\' 2"  (1.88 m)  Wt 262 lb (118.842 kg)  BMI  33.64 kg/m2 General: NAD Neck: Thick, no JVD, no thyromegaly or thyroid nodule.  Lungs: Clear to auscultation bilaterally with normal respiratory effort. CV: Nondisplaced PMI.  Heart regular S1/S2, no S3/S4, no murmur.  No peripheral edema.  No carotid bruit.  Normal pedal pulses.  Abdomen: Soft, nontender, no hepatosplenomegaly, no distention.  Neurologic: Alert and oriented x 3.  Psych: Normal affect. Extremities: No clubbing or cyanosis.   Assessment/Plan:  Coronary Artery Disease  No ischemic symptoms. Continue ASA, Plavix, ACEI, beta blocker, statin.  Will likely stop Plavix at next appointment.  Ischemic Cardiomyopathy  EF has returned to normal, 55-60% on 3/13 echo. Continue current doses of Coreg, lisinopril, and spironolactone. Will get a BMET.  Hyperlipidemia Good LDL, tolerating high dose statin.  Weight Gain He has not been able to do as much walking with knee pain.  He also has significant dietary indiscretion.  I  talked to him today about watching his dietary intake and cutting back especially on bread products.   Randy Castro 07/18/2012 11:22 AM

## 2012-07-18 NOTE — Patient Instructions (Addendum)
Your physician wants you to follow-up in: 6 MONTHS WITH DR MCLEAN You will receive a reminder letter in the mail two months in advance. If you don't receive a letter, please call our office to schedule the follow-up appointment.   Your physician recommends that you HAVE LAB WORK TODAY 

## 2012-07-20 ENCOUNTER — Encounter: Payer: Self-pay | Admitting: *Deleted

## 2012-07-26 ENCOUNTER — Ambulatory Visit: Payer: BC Managed Care – PPO | Admitting: Internal Medicine

## 2012-07-28 ENCOUNTER — Ambulatory Visit (INDEPENDENT_AMBULATORY_CARE_PROVIDER_SITE_OTHER): Payer: BC Managed Care – PPO | Admitting: Internal Medicine

## 2012-07-28 VITALS — BP 142/80 | HR 60 | Temp 97.5°F | Wt 260.0 lb

## 2012-07-28 DIAGNOSIS — E118 Type 2 diabetes mellitus with unspecified complications: Secondary | ICD-10-CM

## 2012-07-28 DIAGNOSIS — I10 Essential (primary) hypertension: Secondary | ICD-10-CM

## 2012-07-28 DIAGNOSIS — R5383 Other fatigue: Secondary | ICD-10-CM

## 2012-07-28 DIAGNOSIS — Z0000 Encounter for general adult medical examination without abnormal findings: Secondary | ICD-10-CM

## 2012-07-28 DIAGNOSIS — Z23 Encounter for immunization: Secondary | ICD-10-CM

## 2012-07-28 LAB — HEPATIC FUNCTION PANEL
ALT: 35 U/L (ref 0–53)
Alkaline Phosphatase: 69 U/L (ref 39–117)
Bilirubin, Direct: 0.1 mg/dL (ref 0.0–0.3)
Total Protein: 7 g/dL (ref 6.0–8.3)

## 2012-07-28 MED ORDER — CLOTRIMAZOLE-BETAMETHASONE 1-0.05 % EX CREA: TOPICAL_CREAM | Freq: Two times a day (BID) | CUTANEOUS | Status: DC | PRN

## 2012-07-28 NOTE — Progress Notes (Signed)
  Subjective:    [name], male    DOB: Apr 08, 1951, 61 y.o.   MRN: [id number]  HPI Routine office visit CAD, cardiology notereviewed, stable. Diabetes, good compliance with medications. Ambulatory blood sugars are still elevated at 160, 180.   Past Medical History  Diagnosis Date  . Diabetes mellitus dx in the 90s  . Hyperlipidemia   . HTN (hypertension)   . Arthritis   . CAD (coronary artery disease)     NSTEMI 12/12: LHC 08/06/11: mLAD 99%, ant HK with 40-45%.  PCI:  Cutting balloon PTCA + Promus DES x 1 to mid LAD.  DAPT recommended x 1 year  . Ischemic cardiomyopathy     echo 08/06/11: dist ant wall, apical and septal and infero-apical HK, mild LVH, EF 40%, mild LAE, PASP 34, asc aorta mildly dilated, mild TR   Past Surgical History  Procedure Date  . Neck surgery in the 90s    "disk removal"     Review of Systems Last time he complained about fatigue, symptoms resolved. Diet has not improved. Exercise a little more, he had some issues with knee pain and the pain is better.     Objective:   Physical Exam  General -- alert, well-developed, and overweight appearing. No apparent distress.  Lungs -- normal respiratory effort, no intercostal retractions, no accessory muscle use, and normal breath sounds.  Heart-- normal rate, regular rhythm, no murmur, and no gallop.  DIABETIC FEET EXAM: No lower extremity edema Normal pedal pulses bilaterally Skin normal and nails are thick Pinprick examination of the feet normal. Psych-- Cognition and judgment appear intact. Alert and cooperative with normal attention span and concentration. not anxious appearing and not depressed appearing.     Assessment & Plan:

## 2012-07-28 NOTE — Assessment & Plan Note (Signed)
iFOB provided again Flu shot today

## 2012-07-28 NOTE — Assessment & Plan Note (Signed)
Resolved

## 2012-07-28 NOTE — Assessment & Plan Note (Addendum)
Feet exam normal today Negative for eye exam last month per patient. DM not well controlled, today reports that he is more open to the idea of insulin. Again discussed with the patient the role of diet on his diabetes control and overall well-being. Encouraged to improve. Plan:  A1c, consider endocrinology referral for initiation of insulin   Nutritionist referral

## 2012-07-28 NOTE — Assessment & Plan Note (Signed)
Good medication compliance, systolic BP in the 140s, the patient is geting about the same readings at home. Recommend diet, exercise and recheck on return to the office. I would like to see his SBP around 120

## 2012-07-29 ENCOUNTER — Encounter: Payer: Self-pay | Admitting: Internal Medicine

## 2012-08-01 NOTE — Addendum Note (Signed)
Addended by: Edwena Felty T on: 08/01/2012 04:38 PM   Modules accepted: Orders

## 2012-08-05 ENCOUNTER — Encounter: Payer: Self-pay | Admitting: Internal Medicine

## 2012-08-05 ENCOUNTER — Other Ambulatory Visit: Payer: Self-pay | Admitting: Internal Medicine

## 2012-08-05 ENCOUNTER — Ambulatory Visit (INDEPENDENT_AMBULATORY_CARE_PROVIDER_SITE_OTHER): Payer: BC Managed Care – PPO | Admitting: Internal Medicine

## 2012-08-05 VITALS — BP 126/62 | HR 72 | Temp 97.5°F | Resp 16 | Ht 73.0 in | Wt 264.0 lb

## 2012-08-05 DIAGNOSIS — E118 Type 2 diabetes mellitus with unspecified complications: Secondary | ICD-10-CM

## 2012-08-05 MED ORDER — GLIPIZIDE 10 MG PO TABS
10.0000 mg | ORAL_TABLET | Freq: Two times a day (BID) | ORAL | Status: DC
Start: 2012-08-05 — End: 2012-11-03

## 2012-08-05 NOTE — Patient Instructions (Addendum)
Please stop Amaryl and start Glipizide 10 mg daily. Please return in 3 months with your sugar log. Please stop by the lab to have a urine protein test.  Please try the following to reduce your weight: - substitute whole grain for white bread or pasta - substitute [place] rice for white rice - substitute 90-calorie flat bread pieces for slices of bread when possible - substitute sweet potatoes or yams for white potatoes - substitute humus for margarine - substitute tofu for cheese when possible - substitute almond or rice milk for regular milk (would not drink soy milk daily due to concern for soy estrogen influence on breast cancer risk) - substitute dark chocolate for other sweets when possible - do not skip breakfast or other meals (this will slow down the metabolism and will result in more weight gain over time)  - can try smoothies made from fruit and almond/rice milk in am instead of regular breakfast - can also try old-fashioned (not instant) oatmeal made with almond/rice milk in am - order the dressing on the side when eating salad at a restaurant - eat as little meat as possible - can try juicing, but should not forget that juicing will get rid of the fiber, so would alternate with eating raw veg./fruits or drinking smoothies - use as little oil as possible, even when using olive oil - can dress a salad with a mix of balsamic vinegar and lemon juice, for e.g. - use agave nectar, stevia sugar, or regular sugar rather than artificial sweateners - steam or broil/roast veggies  - snack on veggies/fruit/nuts (unsalted, preferably) when possible, rather than processed foods - reduce or eliminate aspartame in diet (it is in diet sodas, chewing gum, etc) Read the labels!  Please consider a plant-based diet.  To help you with this, you can start by watching/reading the following: - lectures (you tube):  Lequita Asal: "Breaking the Food Seduction"  Doug Lisle: "How to Lose Weight,  without Losing Your Mind"  Lucile Crater: "What is Insulin Resistance" [employer] - documentaries:  Supersize Me  [employer]  Forks over [employer], Sick and Nearly Dead  [employer] - books:  Lequita Asal: "Program for Reversing Diabetes"  Ferol Luz: "The [place] Study"  Konrad Penta: "Supermarket Vegan" (cookbook)

## 2012-08-05 NOTE — Progress Notes (Signed)
Subjective:     Patient ID: Randy Castro., male   DOB: Jun 05, 1951, 61 y.o.   MRN: [id number]  HPI [name] is a pleasant 61 y/o man referred by his PCP, Dr. Drue Novel, for management of DM2, uncontrolled, non-insulin dependent, with complications (CAD, s/p MI 07/2011, s/p PTCA with DES; also ED); question about starting insulin. He is here with his wife.  Pt's DM2 was dx ~22 years ago. He is now on a regimen of: - JanuMet 50/1000 bid - Amaryl 6 mg daily He tells me he feels dazed from the medication, losing balance, not actually falling or losing consciousness. He does admit to poor eating, including daily candy. Walks for exercise, but not lately. He plans to start walking with his wife after the first of the year (in ~2 weeks). He checks his sugars once or twice a week: 130-160. Highest sugar: 167. Lowest sugar in last 6 mo 40's when skipping a meal. Usually happens at the end of the day. He has hypoglycemia awareness at 65. He has no DR per last eye exam on 07/16/2012. Last ACR was 2.3 in 03/2011, normal BUN/Cr in 06/2012. No neuropathy.  I reviewed his chart including office notes, telephone notes, labs, and available scanned records. PMH:  Pt had a period off the meds for cost reasons and was restarted on DM meds in 03/2011. In 07/2011, he had a NSTEMI and had a DES placed. He was also dx with CHF, which is now improved (EF increased from 40-45% in 07/2011 to 55-60% in 10/2011). Sees Dr. Shirlee Latch with Cardiology. He also has HL (on Lipitor) and HTN (on Lisinopril, Coreg, Spironolactone). His latest lipid panel (12/2011) was: 103/148/34/40. Last TSH normal, 0.55, in 07/2011.   Review of Systems Constitutional: no weight gain/loss, no fatigue, no subjective hyperthermia/hypothermia; no increased thirst but sometimes increased urination Eyes: no blurry vision ENT: no sore throat, no nodules palpated in throat, no dysphagia/odynophagia, no hoarseness Cardiovascular: no CP/SOB/palpitations/leg  swelling Respiratory: no cough/SOB Gastrointestinal: no N/V/D/C Musculoskeletal: no muscle/joint aches Skin: no rashes Neurological: no tremors/numbness/tingling/dizziness Psychiatric: no depression/anxiety  Past Surgical History  Procedure Date  . Neck surgery in the 90s    "disk removal"   History   Social History  . Marital Status: Married    Spouse Name: N/A    Number of Children: 2 sons   Occupational History  . maintenance tech    Social History Main Topics  . Smoking status: Former Smoker    Quit date: 04/12/1992  . Smokeless tobacco: Never Used     Comment: >20 years ago  . Alcohol Use: Yes     Comment: rarely   . Drug Use: No  . Sexually Active: Yes   Other Topics Concern  . 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 aspirin EC 81 MG EC tablet Take 1 tablet (81 mg total) by mouth daily.  Marland Kitchen atorvastatin (LIPITOR) 80 MG tablet Take 1 tablet (80 mg total) by mouth daily.  . carvedilol (COREG) 12.5 MG tablet Take 1 tablet (12.5 mg total) by mouth 2 (two) times daily.  . clopidogrel (PLAVIX) 75 MG tablet Take 1 tablet (75 mg total) by mouth daily with breakfast.  . clotrimazole-betamethasone (LOTRISONE) cream Apply topically 2 (two) times daily as needed.  . famotidine (PEPCID) 20 MG tablet Take 1 tablet (20 mg total) by  mouth 2 (two) times daily as needed for heartburn.  Marland Kitchen glipiZIDE (GLUCOTROL) 10 MG tablet Take 1 tablet (10 mg total) by mouth 2 (two) times daily before a meal.  . lisinopril (PRINIVIL,ZESTRIL) 10 MG tablet Take 1 tablet (10 mg total) by mouth 2 (two) times daily.  Marland Kitchen MAGNESIUM PO Take 2 tablets by mouth daily.   . Multiple Vitamins-Minerals (MULTIVITAMINS THER. W/MINERALS) TABS Take 1 tablet by mouth daily.    . nitroGLYCERIN (NITROSTAT) 0.4 MG SL tablet Place 1 tablet (0.4 mg total) under the tongue every 5 (five) minutes as needed for chest  pain.  . SitaGLIPtin-MetFORMIN HCl (JANUMET XR) 50-1000 MG TB24 Take 1 tablet by mouth 2 (two) times daily.  Marland Kitchen spironolactone (ALDACTONE) 25 MG tablet TAKE 1/2 TABLET DAILY   No Known Allergies  Family History  Problem Relation Age of Onset  . Colon cancer Neg Hx   . Prostate cancer Neg Hx   . Diabetes      M, sister, brothers   . Coronary artery disease      F , MI at age 2   . Stroke Neg Hx    Objective:   Physical Exam There were no vitals taken for this visit. Wt Readings from Last 3 Encounters:  07/28/12 260 lb (117.935 kg)  07/18/12 262 lb (118.842 kg)  04/26/12 261 lb (118.389 kg)  Constitutional: overweight, in NAD Eyes: PERRLA, EOMI, no exophthalmos ENT: moist mucous membranes, no thyromegaly, no cervical lymphadenopathy Cardiovascular: RRR, No MRG Respiratory: CTA B Gastrointestinal: abdomen soft, NT, ND, BS+ Musculoskeletal: no deformities, strength intact in all 4 Skin: moist, warm, no rashes Neurological: no tremor with outstretched hands, DTR normal in all 4  Assessment:     1. DM2, non-insulin dependent, uncontrolled, with complications: - CAD, s/p NSTEMI 07/2011, with PTCA + DES - CHF, EF 55-60% per 2D ECHO in 10/2011 - ED - no DR - no nephropathy 2. Class II Obesity, Body mass index is 34.83 kg/(m^2).  Plan:     1. DM2 - he describes that he is constantly in a daze, after he started his diabetic medications, and has to keep eating to prevent his sugar from dropping. I suspect the culprit is Amaryl. His spx intensified when he tried to double the dose from 4 to 8 mg daily, now a little better on 6 mg daily - we will d/c Amaryl and start Glipizide as it has fewer hypoglycemic side effects. We are limited by price, he cannot afford some of the more expensive medications - would keep on JanuMet for now, but we can switch to Januvia and Metformin separately in the future.  - we discussed insulin treatment and the fact that we usually start with one  injection a day and he is not opposed to the idea, as long as we start with a pen. I explained that Lantus pens can also get expensive. However, his HbA1C is fairly close to target, at 7.8%, and he is very determined to start dieting and improve his eating and his weight as a New Year resolution. He and his wife have an appt with a nutritionist on 12/31. In this context, I think it is worth alowing him 3 months to see if he can reduce his sugars and his HbA1C and then reevaluate. - we will check a UACR today - given sugar log and advised how to fill it - given foot care handout and explained the principles - given instructions for hypoglycemia management "15-15 rule"  2. Obesity We had a long discussion about ways to reduce his weight, mostly how to make smart choices in terms of the foods he eats. For e.g.: - substitute whole grain for white bread or pasta - substitute [place] rice for white rice - substitute 90-calorie flat bread pieces for slices of bread when possible - substitute sweet potatoes or yams for white potatoes - substitute humus for margarine - substitute tofu for cheese when possible - substitute almond or rice milk for regular milk (would not drink soy milk daily due to concern for soy estrogen influence on breast cancer risk) - substitute dark chocolate for other sweets when possible - do not skip breakfast or other meals (this will slow down the metabolism and will result in more weight gain over time)  - can try smoothies made from fruit and almond/rice milk in am instead of regular breakfast - can also try old-fashioned (not instant) oatmeal made with almond/rice milk in am - order the dressing on the side when eating salad at a restaurant - eat as little meat as possible - can try juicing, but should not forget that juicing will get rid of the fiber, so would alternate with eating raw veg./fruits or drinking smoothies - use as little oil as possible, even when using olive  oil - can dress a salad with a mix of balsamic vinegar and lemon juice, for e.g. - use agave nectar, stevia sugar, or regular sugar rather than artificial sweateners - steam or broil/roast veggies  - snack on veggies/fruit/nuts (unsalted, preferably) when possible, rather than processed foods - reduce or eliminate aspartame in diet (it is in diet sodas, chewing gum, etc) Read the labels!  Since pt and his wife appeared interested in a plant-based diet, I also gave them the following references: - lectures (you tube):  Lequita Asal: "Breaking the Food Seduction"  Doug Lisle: "How to Lose Weight, without Losing Your Mind"  Lucile Crater: "What is Insulin Resistance" [email or web address] - documentaries:  Supersize Me  [employer]  Forks over [employer], Sick and Nearly Dead  [employer] - books:  Lequita Asal: "Program for Reversing Diabetes"  Ferol Luz: "The [place] Study"  Konrad Penta: "Supermarket Vegan" (cookbook)  We set a goal of 10 lbs lost for our next appt in 3 months. At that time I will check a HbA1C.   Orders Only on 08/05/2012  Component Date Value Range Status  . Microalb, Ur 08/05/2012 1.72  0.00 - 1.89 mg/dL Final  . Creatinine, Urine 08/05/2012 145.9   Final  . Microalb Creat Ratio 08/05/2012 11.8  0.0 - 30.0 mg/g Final   Message sent through MyChart:  Dear Mr. Snoke, Your urinary protein level was normal, which is great, however, it is a little higher than at last check a year ago. I believe that it will go back down with better control of your diabetes.  Good luck with the diet! Please let me know if you have any questions. Sincerely, Carlus Pavlov MD

## 2012-08-06 LAB — MICROALBUMIN / CREATININE URINE RATIO
Creatinine, Urine: 145.9 mg/dL
Microalb Creat Ratio: 11.8 mg/g (ref 0.0–30.0)

## 2012-08-09 ENCOUNTER — Other Ambulatory Visit (INDEPENDENT_AMBULATORY_CARE_PROVIDER_SITE_OTHER): Payer: BC Managed Care – PPO

## 2012-08-09 ENCOUNTER — Encounter: Payer: Self-pay | Admitting: *Deleted

## 2012-08-09 DIAGNOSIS — Z0000 Encounter for general adult medical examination without abnormal findings: Secondary | ICD-10-CM

## 2012-08-09 LAB — FECAL OCCULT BLOOD, IMMUNOCHEMICAL: Fecal Occult Bld: NEGATIVE

## 2012-08-11 ENCOUNTER — Other Ambulatory Visit: Payer: Self-pay | Admitting: Cardiology

## 2012-08-15 ENCOUNTER — Other Ambulatory Visit: Payer: Self-pay | Admitting: *Deleted

## 2012-08-15 ENCOUNTER — Other Ambulatory Visit: Payer: Self-pay

## 2012-08-15 DIAGNOSIS — R002 Palpitations: Secondary | ICD-10-CM

## 2012-08-15 DIAGNOSIS — I2510 Atherosclerotic heart disease of native coronary artery without angina pectoris: Secondary | ICD-10-CM

## 2012-08-15 DIAGNOSIS — I2589 Other forms of chronic ischemic heart disease: Secondary | ICD-10-CM

## 2012-08-15 DIAGNOSIS — E785 Hyperlipidemia, unspecified: Secondary | ICD-10-CM

## 2012-08-15 DIAGNOSIS — I10 Essential (primary) hypertension: Secondary | ICD-10-CM

## 2012-08-15 MED ORDER — SPIRONOLACTONE 25 MG PO TABS: 12.5000 mg | ORAL_TABLET | Freq: Every day | ORAL | Status: DC

## 2012-08-15 MED ORDER — ATORVASTATIN CALCIUM 80 MG PO TABS: 80.0000 mg | ORAL_TABLET | Freq: Every day | ORAL | Status: DC

## 2012-08-15 MED ORDER — LISINOPRIL 10 MG PO TABS: 10.0000 mg | ORAL_TABLET | Freq: Two times a day (BID) | ORAL | Status: DC

## 2012-08-19 ENCOUNTER — Other Ambulatory Visit: Payer: Self-pay | Admitting: *Deleted

## 2012-08-21 LAB — HM DIABETES FOOT EXAM: HM Diabetic Foot Exam: NORMAL

## 2012-08-23 ENCOUNTER — Encounter: Payer: Self-pay | Admitting: *Deleted

## 2012-08-23 ENCOUNTER — Encounter: Payer: BC Managed Care – PPO | Attending: Internal Medicine | Admitting: *Deleted

## 2012-08-23 VITALS — Ht 73.0 in | Wt 268.9 lb

## 2012-08-23 DIAGNOSIS — E118 Type 2 diabetes mellitus with unspecified complications: Secondary | ICD-10-CM | POA: Insufficient documentation

## 2012-08-23 DIAGNOSIS — Z713 Dietary counseling and surveillance: Secondary | ICD-10-CM | POA: Insufficient documentation

## 2012-08-23 NOTE — Progress Notes (Signed)
  Medical Nutrition Therapy:  Appt start time: 1015 end time:  1115.  Assessment:  Primary concerns today: patient here with his wife for diabetes edcation. Has had diabetes for 30 years, has SMBG but doesn't test often. 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  MEDICATIONS: see list   DIETARY INTAKE:  Usual eating pattern includes 3 meals and 1-2 snacks per day.  Everyday foods include good variety of all food groups.  Avoided foods include: regular soda.    24-hr recall:  B ( AM): regular oatmeal with cinnamon and skim milk, Sweetener, [place] bacon OR pancakes with SF syrup, coffee with creamer Snk ( AM): pkg of cheese crackers and diet soda  L ( PM): bring from home or buy: left overs from home OR frozen dinner OR fast food double cheeseburger with fish sandwich and small fries with diet soda Snk ( PM): none D ( PM): eat out 1/2 the time: wife cooks when home: meat, starch, vegetables, bread, occasionally a salad, coffee or diet soda Snk ( PM): occasionally fresh fruit, crackers OR cake Beverages: coffee, water or diet soda  Usual physical activity: likes to walk when has time or weather is good  Estimated energy needs: 2000 calories 225 g carbohydrates 150 g protein 55 g fat  Progress Towards Goal(s):  In progress.   Nutritional Diagnosis:  NB-1.1 Food and nutrition-related knowledge deficit As related to diabetes management.  As evidenced by A1c of 7.8% on 07/28/12.    Intervention:  Nutrition counseling and diabetes education initiated. Discussed basic physiology of diabetes, SMBG and rationale of checking BG at alternate times of day, A1c, Carb Counting and reading food labels, and benefits of increased activity. Plan to discuss medications and their actions, A1c, SMBG and activity options in more detail at next visit. Plan:  Aim for 4 Carb Choices per meal (60 grams) +/- 1 either way  Aim for 0-2 Carbs per snack if hungry  Consider reading food  labels for Total Carbohydrate and Fat Grams of foods Consider  increasing your activity level by walking briskly for 15 minutes daily as tolerated Consider checking BG at alternate times per day as directed by MD   Handouts given during visit include: Living Well with Diabetes Carb Counting and Food Label handouts Meal Plan Card  Monitoring/Evaluation:  Dietary intake, exercise, reading food labels, and body weight in 4 week(s) Pt chooses to call to make that appointment from home.

## 2012-08-23 NOTE — Patient Instructions (Addendum)
Plan:  Aim for 4 Carb Choices per meal (60 grams) +/- 1 either way  Aim for 0-2 Carbs per snack if hungry  Consider reading food labels for Total Carbohydrate and Fat Grams of foods Consider  increasing your activity level by walking briskly for 15 minutes daily as tolerated Consider checking BG at alternate times per day as directed by MD

## 2012-08-29 ENCOUNTER — Telehealth: Payer: Self-pay | Admitting: Cardiology

## 2012-08-29 DIAGNOSIS — R002 Palpitations: Secondary | ICD-10-CM

## 2012-08-29 DIAGNOSIS — I10 Essential (primary) hypertension: Secondary | ICD-10-CM

## 2012-08-29 DIAGNOSIS — I2589 Other forms of chronic ischemic heart disease: Secondary | ICD-10-CM

## 2012-08-29 DIAGNOSIS — I2510 Atherosclerotic heart disease of native coronary artery without angina pectoris: Secondary | ICD-10-CM

## 2012-08-29 DIAGNOSIS — E785 Hyperlipidemia, unspecified: Secondary | ICD-10-CM

## 2012-08-29 MED ORDER — LISINOPRIL 10 MG PO TABS: 10.0000 mg | ORAL_TABLET | Freq: Two times a day (BID) | ORAL | Status: DC

## 2012-08-29 MED ORDER — ATORVASTATIN CALCIUM 80 MG PO TABS: 80.0000 mg | ORAL_TABLET | Freq: Every day | ORAL | Status: DC

## 2012-08-29 NOTE — Telephone Encounter (Signed)
New problem:   astrovation 80 mg   Lisinopril  10 mg.    [facility]

## 2012-08-29 NOTE — Telephone Encounter (Signed)
Spoke to patient's wife was told will send 90 day supply of lisinopril and atorvastatin to [facility].

## 2012-09-06 ENCOUNTER — Other Ambulatory Visit: Payer: Self-pay | Admitting: Physician Assistant

## 2012-09-20 ENCOUNTER — Telehealth: Payer: Self-pay | Admitting: Cardiology

## 2012-09-20 MED ORDER — CLOPIDOGREL BISULFATE 75 MG PO TABS: 75.0000 mg | ORAL_TABLET | Freq: Every day | ORAL | Status: DC

## 2012-09-20 NOTE — Telephone Encounter (Signed)
Pt's wife aware this has been done.

## 2012-09-20 NOTE — Telephone Encounter (Signed)
New Problem:    Patient's wife called in needing a refill of her husband's clopidogrel 75mg .  Please call back if you have any questions.

## 2012-09-28 ENCOUNTER — Other Ambulatory Visit: Payer: Self-pay | Admitting: Internal Medicine

## 2012-09-28 MED ORDER — SITAGLIP PHOS-METFORMIN HCL ER 50-1000 MG PO TB24: 1.0000 | ORAL_TABLET | Freq: Two times a day (BID) | ORAL | Status: DC

## 2012-09-28 NOTE — Telephone Encounter (Signed)
refill janumet tablet 50-1000mg  Take 1 by mouth twice daily with a meal--requesting 90-day supply no last fill date not on current meds listing

## 2012-09-28 NOTE — Telephone Encounter (Signed)
Refill done.  

## 2012-11-03 ENCOUNTER — Ambulatory Visit (INDEPENDENT_AMBULATORY_CARE_PROVIDER_SITE_OTHER): Payer: BC Managed Care – PPO | Admitting: Internal Medicine

## 2012-11-03 ENCOUNTER — Encounter: Payer: Self-pay | Admitting: Internal Medicine

## 2012-11-03 VITALS — BP 130/70 | HR 74 | Temp 97.7°F | Ht 73.0 in | Wt 263.0 lb

## 2012-11-03 DIAGNOSIS — E118 Type 2 diabetes mellitus with unspecified complications: Secondary | ICD-10-CM

## 2012-11-03 LAB — HEMOGLOBIN A1C: Hgb A1c MFr Bld: 8 % — ABNORMAL HIGH (ref 4.6–6.5)

## 2012-11-03 NOTE — Progress Notes (Addendum)
Subjective:     Patient ID: Randy Castro., male   DOB: Feb 24, 1951, 62 y.o.   MRN: [id number]  HPI Randy Castro is a pleasant 62 y/o man referred by his PCP, Dr. Drue Castro, for management of DM2, dx 1998, uncontrolled, non-insulin dependent, with complications (CAD, s/p MI 07/2011, s/p PTCA with DES; also ED). He is here with his wife.  He is now on a regimen of: - JanuMet 50/1000 bid - they pay 240$ for this every 3 months - Glipizide 10 mg twice a day. We stopped Amaryl at last visit (pt felt "in a daze") and started Glipizide (has fewer hypoglycemic side effects and we are limited by price), but did not start basal insulin - to give him a chance to change and improve his diet and start exercising to see how these measures impact his sugar control. We discussed at length about diet at last visit and I suggested some changes as he did admit to poor eating, including daily candy. He and his wife had an appt with nutrition on 08/23/12. They felt that this was very beneficial for them and they started to institute these changes. He did start walking with his wife, but not daily since the weather was not permitting. He lost 5 pounds since last visit, however he tells me that he did gain more weight over the holidays. His HbA1C was fairly close to target, at 7.8%, 3 months ago.  Before last visit, he was checking his sugars once or twice a week: 130-160.   He now checks approx 1/day, but only in am; - am: 140-190, one CBG at 229 - he did not check later in the day except for 2 times, when his sugar was 89 and then 90 before dinner Highest 229. Lowest: low 50s, had 3-4 lows in last 3 mo: 50-70. He has hypoglycemia awareness at 65. He mentions that if he walks and eats right, his sugars tend to drop.   He has no DR per last eye exam on 07/16/2012. Last ACR was 2.3 in 03/2011, normal BUN/Cr in 06/2012. No neuropathy.  I reviewed his chart including office notes, telephone notes, labs, and available scanned  records.  PMH:  Pt had a period off the meds for cost reasons and was restarted on DM meds in 03/2011. In 07/2011, he had a NSTEMI and had a DES placed. He was also dx with CHF, which is now improved (EF increased from 40-45% in 07/2011 to 55-60% in 10/2011). Sees Dr. Shirlee Castro with Cardiology. He also has HL (on Lipitor) and HTN (on Lisinopril, Coreg, Spironolactone). His latest lipid panel (12/2011) was: 103/148/34/40. Last TSH normal, 0.55, in 07/2011.   I reviewed pt's medications, allergies, PMH, social hx, family hx and no changes required.  Review of Systems Constitutional: + 5 lb weight loss, no fatigue, no subjective hyperthermia/hypothermia, denies increased thirst or urination Eyes: no blurry vision, no xerophthalmia ENT: no sore throat, no nodules palpated in throat, no dysphagia/odynophagia, + hoarseness; sinus congestion, nasonated voice  Cardiovascular: no CP/SOB/palpitations/leg swelling Respiratory: no cough/+ SOB Gastrointestinal: no N/V/D/C Musculoskeletal: no muscle/joint aches Skin: no rashes Neurological: no tremors/numbness/tingling/dizziness Psychiatric: no depression/anxiety  Objective:   Physical Exam BP 130/70  Pulse 74  Temp(Src) 97.7 F (36.5 C) (Oral)  Wt 263 lb (119.296 kg)  BMI 34.71 kg/m2 Wt Readings from Last 3 Encounters:  11/03/12 263 lb (119.296 kg)  08/23/12 268 lb 14.4 oz (121.972 kg)  08/05/12 264 lb (119.75 kg)  Constitutional: overweight, in  NAD Eyes: PERRLA, EOMI, no exophthalmos ENT: moist mucous membranes, no thyromegaly, no cervical lymphadenopathy Cardiovascular: RRR, No MRG Respiratory: CTA B Gastrointestinal: abdomen soft, NT, ND, BS+ Musculoskeletal: no deformities, strength intact in all 4 Skin: moist, warm, no rashes Neurological: no tremor with outstretched hands, DTR normal in all 4   Assessment:     . DM2, non-insulin dependent, uncontrolled, with complications: - CAD,  s/p NSTEMI 07/2011, with PTCA + DES (Dr. Shirlee Castro) -  CHF, EF 55-60% per 2D ECHO in 10/2011 - ED - no DR - no nephropathy 2. Class II Obesity, Body mass index is 34.83 kg/(m^2).    Plan:   Patient with long history of DM2, with a hemoglobin A1c close to goal, however with high sugars in a.m. He has occasional hypoglycemia if he does not eat, if he exercises, or if he prolongs the time until his next meal. I believe that this is caused by his sulfonylurea. At last visit I switched him from glimepiride to glipizide, as this has a lower incidence of hypoglycemia, however the patient clearly cannot tolerate this medication either. - We will stop the glipizide - it is possible that he has hypoglycemia events at night, and then develops hyperglycemia as a compensatory mechanism in a.m. - Therefore, for now, will keep on Metformin and Januvia combination only. This is, however, expensive for the patient (they pay $240 for 3 months supply, however after he retires, which will happen at the end of this year, they would have to pay more than $800 for the same amount), and I advised him to find out whether splitting the combination pill into its constituents would be cheaper for them. Patient's wife will find out and let me know. For now, I gave him samples of Janumet.  -  I advised the patient to try to check sugars as many times as possible during the day, and I would see him back with his log in 2 weeks to reevaluate the need of addition of other medicines. I am tempted to add bromocriptine, however I'm not sure how much of this will be covered by his insurance. If this is not an option, we can add basal insulin. I did advise him to try to walk daily in the next 2 weeks and to the right to see how much these measures will impact his sugars. - I gave him a One Touch Delica meter - return to clinic in 2 weeks with a sugar log - We'll check a hemoglobin A1c today  Lab Results  Component Value Date   HGBA1C 8.0* 11/03/2012

## 2012-11-03 NOTE — Patient Instructions (Addendum)
Please return in 2 weeks with your sugar log. Please stop glipizide. Check sugars as many times as you can in a day (at least 3 times).

## 2012-11-17 ENCOUNTER — Ambulatory Visit (INDEPENDENT_AMBULATORY_CARE_PROVIDER_SITE_OTHER): Payer: BC Managed Care – PPO | Admitting: Internal Medicine

## 2012-11-17 ENCOUNTER — Encounter: Payer: Self-pay | Admitting: Internal Medicine

## 2012-11-17 VITALS — BP 120/64 | HR 65 | Temp 97.8°F | Resp 10 | Wt 263.0 lb

## 2012-11-17 DIAGNOSIS — E118 Type 2 diabetes mellitus with unspecified complications: Secondary | ICD-10-CM

## 2012-11-17 MED ORDER — INSULIN GLARGINE 100 UNIT/ML ~~LOC~~ SOLN: 15.0000 [IU] | Freq: Every day | SUBCUTANEOUS | Status: DC

## 2012-11-17 MED ORDER — INSULIN PEN NEEDLE 31G X 6 MM MISC: Status: DC

## 2012-11-17 NOTE — Progress Notes (Signed)
Subjective:     Patient ID: Randy Castro., male   DOB: 1951-03-10, 62 y.o.   MRN: [id number]  HPI Randy Castro is a pleasant 62 y/o man referred by his PCP, Dr. Drue Novel, for management of DM2, dx 1998, uncontrolled, non-insulin dependent, with complications (CAD, s/p MI 07/2011, s/p PTCA with DES; also ED). He is here with his wife.   He is now on a regimen of: - JanuMet 50/1000 bid - they pay 229$ for this every 3 months - we stopped Glipizide 10 mg bid at last visit. We did not start basal insulin - to give him a chance to change and improve his diet and start exercising to see how these measures impact his sugar control. We discussed at length about diet at previous visits and I suggested some changes as he did admit to poor eating, including daily candy. He and his wife had an appt with nutrition on 08/23/12. They felt that this was very beneficial for them and they started to institute these changes. He is walking with his wife, but seldom since the weather was not permitting. His HbA1C was fairly close to target, at 7.8%, 3.5 months ago.  He checks his sugars approx 1/day: - am: 136-203 - before lunch: 159-240 - before dinner: 166-254 - bedtime: 152-279 - he did not check later in the day except for 2 times, when his sugar was 89 and then 90 before dinner Highest 229. Lowest: low 50s, had 3-4 lows in last 3 mo: 50-70. He has hypoglycemia awareness at 65.  He has no DR per last eye exam on 07/16/2012. Last ACR was 2.3 in 03/2011, normal BUN/Cr in 06/2012. No neuropathy.  I reviewed his chart including office notes, telephone notes, labs, and available scanned records.  PMH:  Pt had a period off the meds for cost reasons and was restarted on DM meds in 03/2011. In 07/2011, he had a NSTEMI and had a DES placed. He was also dx with CHF, which is now improved (EF increased from 40-45% in 07/2011 to 55-60% in 10/2011). Sees Dr. Shirlee Latch with Cardiology. He also has HL (on Lipitor) and HTN (on  Lisinopril, Coreg, Spironolactone). His latest lipid panel (12/2011) was: 103/148/34/40. Last TSH normal, 0.55, in 07/2011.   I reviewed pt's medications, allergies, PMH, social hx, family hx and no changes required.  Review of Systems Constitutional: no weight gain/loss, no fatigue, no subjective hyperthermia/hypothermia Eyes: no blurry vision, no xerophthalmia ENT: no sore throat, no nodules palpated in throat, no dysphagia/odynophagia, no hoarseness Cardiovascular: no CP/SOB/palpitations/leg swelling Respiratory: no cough/SOB Gastrointestinal: no N/V/D/C Musculoskeletal: no muscle/joint aches Skin: no rashes Neurological: no tremors/numbness/tingling/dizziness Psychiatric: no depression/anxiety     Objective:   Physical Exam BP 120/64  Pulse 65  Temp(Src) 97.8 F (36.6 C) (Oral)  Resp 10  Wt 263 lb (119.296 kg)  BMI 34.71 kg/m2  SpO2 95% Wt Readings from Last 3 Encounters:  11/17/12 263 lb (119.296 kg)  11/03/12 263 lb (119.296 kg)  08/23/12 268 lb 14.4 oz (121.972 kg)   Constitutional: overweight, in NAD Eyes: PERRLA, EOMI, no exophthalmos ENT: moist mucous membranes, no thyromegaly, no cervical lymphadenopathy Cardiovascular: RRR, No MRG Respiratory: CTA B Gastrointestinal: abdomen soft, NT, ND, BS+ Musculoskeletal: no deformities, strength intact in all 4 Skin: moist, warm, no rashes Neurological: no tremor with outstretched hands, DTR normal in all 4     Assessment:     1. DM2, non-insulin dependent, uncontrolled, with complications: - CAD,  s/p NSTEMI  07/2011, with PTCA + DES (Dr. Shirlee Latch) - CHF, EF 55-60% per 2D ECHO in 10/2011 - ED - no DR - no nephropathy 2. Class II Obesity, Body mass index is 34.83 kg/(m^2).    Plan:     Patient's sugars remain elevated, despite adding back glipizide approximately 10 days ago, after he had a CBG of 320 at bedtime. He admits for dietary indiscretions, and he was not able to exercise as much as he wanted, due to the  weather. - I will continue his glipizide for now - Will continue the Janumet (I gave him samples of Jentadueto, to be used interchangeably with Janumet) - Since patient's sugars are high starting in the morning, I discussed with him and his wife about the addition of Lantus as 15 units at bedtime. I advised him to let me know if his sugars in the morning are still higher than 150, in that case we would need to go up on the Lantus probably to 18 or 20 units. - I gave him prescription for Lantus pen and needles, and I also gave him 2 sample pens - I showed him how to inject the Lantus, he knows about it from his 2 brothers who also have diabetes and use insulin pens - I gave him more sugar logs - Given a brochure about insulin injection sites - Advised him to leave the insulin pen in use out of the fridge - patient will return in a month with his sugar log

## 2012-11-17 NOTE — Patient Instructions (Addendum)
Please start Lantus insulin under skin injection 15 units at bedtime. Continue either Jentadueto or Janumet. Continue Glipizide. Please return in 1 month with your log Send me a message if you have high (>200) or low sugars (<70) or with any questions.

## 2012-11-18 ENCOUNTER — Other Ambulatory Visit: Payer: Self-pay | Admitting: *Deleted

## 2012-11-18 ENCOUNTER — Telehealth: Payer: Self-pay | Admitting: Internal Medicine

## 2012-11-18 DIAGNOSIS — I2510 Atherosclerotic heart disease of native coronary artery without angina pectoris: Secondary | ICD-10-CM

## 2012-11-18 DIAGNOSIS — I5022 Chronic systolic (congestive) heart failure: Secondary | ICD-10-CM

## 2012-11-18 MED ORDER — INSULIN PEN NEEDLE 31G X 6 MM MISC: Status: DC

## 2012-11-18 MED ORDER — CARVEDILOL 12.5 MG PO TABS: 12.5000 mg | ORAL_TABLET | Freq: Two times a day (BID) | ORAL | Status: DC

## 2012-11-18 NOTE — Telephone Encounter (Signed)
The pt called hoping to get a refill of pen needles .

## 2012-11-28 ENCOUNTER — Ambulatory Visit (INDEPENDENT_AMBULATORY_CARE_PROVIDER_SITE_OTHER): Payer: BC Managed Care – PPO | Admitting: Internal Medicine

## 2012-11-28 ENCOUNTER — Encounter: Payer: Self-pay | Admitting: Internal Medicine

## 2012-11-28 VITALS — BP 146/80 | HR 57 | Temp 97.8°F | Wt 264.0 lb

## 2012-11-28 DIAGNOSIS — Z2911 Encounter for prophylactic immunotherapy for respiratory syncytial virus (RSV): Secondary | ICD-10-CM

## 2012-11-28 DIAGNOSIS — Z23 Encounter for immunization: Secondary | ICD-10-CM

## 2012-11-28 DIAGNOSIS — I10 Essential (primary) hypertension: Secondary | ICD-10-CM

## 2012-11-28 DIAGNOSIS — R002 Palpitations: Secondary | ICD-10-CM

## 2012-11-28 DIAGNOSIS — I2510 Atherosclerotic heart disease of native coronary artery without angina pectoris: Secondary | ICD-10-CM

## 2012-11-28 DIAGNOSIS — I2589 Other forms of chronic ischemic heart disease: Secondary | ICD-10-CM

## 2012-11-28 DIAGNOSIS — E785 Hyperlipidemia, unspecified: Secondary | ICD-10-CM

## 2012-11-28 LAB — CBC WITH DIFFERENTIAL/PLATELET
Basophils Absolute: 0.1 10*3/uL (ref 0.0–0.1)
Eosinophils Relative: 5.2 % — ABNORMAL HIGH (ref 0.0–5.0)
HCT: 38.6 % — ABNORMAL LOW (ref 39.0–52.0)
Lymphs Abs: 2 10*3/uL (ref 0.7–4.0)
MCV: 84.5 fl (ref 78.0–100.0)
Monocytes Absolute: 0.6 10*3/uL (ref 0.1–1.0)
Monocytes Relative: 7.8 % (ref 3.0–12.0)
Neutrophils Relative %: 59.6 % (ref 43.0–77.0)
Platelets: 229 10*3/uL (ref 150.0–400.0)
RDW: 14.4 % (ref 11.5–14.6)
WBC: 7.5 10*3/uL (ref 4.5–10.5)

## 2012-11-28 LAB — BASIC METABOLIC PANEL
BUN: 9 mg/dL (ref 6–23)
Creatinine, Ser: 0.8 mg/dL (ref 0.4–1.5)
GFR: 112.43 mL/min (ref >60.00)
Glucose, Bld: 161 mg/dL — ABNORMAL HIGH (ref 70–99)
Potassium: 3.8 mEq/L (ref 3.5–5.1)

## 2012-11-28 LAB — ALT: ALT: 31 U/L (ref 0–53)

## 2012-11-28 LAB — LIPID PANEL: Total CHOL/HDL Ratio: 4

## 2012-11-28 MED ORDER — ISOSORBIDE MONONITRATE ER 30 MG PO TB24: 30.0000 mg | ORAL_TABLET | Freq: Every day | ORAL | Status: DC

## 2012-11-28 MED ORDER — LISINOPRIL 10 MG PO TABS: 10.0000 mg | ORAL_TABLET | Freq: Two times a day (BID) | ORAL | Status: DC

## 2012-11-28 NOTE — Patient Instructions (Signed)
If chest pain, take nitroglycerin every 5 minutes 3 times, indigestion continue or he is severe at any point: Call [phone]

## 2012-11-28 NOTE — Assessment & Plan Note (Signed)
Due for labs

## 2012-11-28 NOTE — Assessment & Plan Note (Addendum)
Having episodes of chest pain, see history of present illness, suspicious for angina. EKG today sinus brady, no acute Has an appointment with cardiology 01/12/2013. We'll see if they can see him sooner. Add imdur and  encouraged to use nitroglycerin as needed.

## 2012-11-28 NOTE — Assessment & Plan Note (Signed)
No ambulatory BPs but BP usually normal at office visits

## 2012-11-28 NOTE — Progress Notes (Signed)
  Subjective:    Patient ID: Randy Castro., male    DOB: 13-Apr-1951, 62 y.o.   MRN: [id number]  HPI Routine office visit Hypertension, not ambulatory BPs. BP today slightly elevated but normal at other visits. Diabetes, now under the care of endocrinology, just a started insulin, CBG start gradually decreasing. CAD, good compliance with medications, when asked, he admits to occasional chest pain. Pain usually happens at work w/ physical activity, is in the left anterior chest, last one or 2 minutes, there is no associated symptoms except occasional shortness or breath. Has not taken nitroglycerin.   Past Medical History  Diagnosis Date  . Diabetes mellitus dx in the 90s  . Hyperlipidemia   . HTN (hypertension)   . Arthritis   . CAD (coronary artery disease)     NSTEMI 12/12: LHC 08/06/11: mLAD 99%, ant HK with 40-45%.  PCI:  Cutting balloon PTCA + Promus DES x 1 to mid LAD.  DAPT recommended x 1 year  . Ischemic cardiomyopathy     echo 08/06/11: dist ant wall, apical and septal and infero-apical HK, mild LVH, EF 40%, mild LAE, PASP 34, asc aorta mildly dilated, mild TR   Past Surgical History  Procedure Laterality Date  . Neck surgery  in the 90s    "disk removal"   Review of Systems Medication list reviewed. Good compliance Diet: Very mild improvement. Admits to occasional indigestion, described as heartburn, usually not associated with the above described  chest pain. No nausea, vomiting, diarrhea or blood in the stools. No edema.     Objective:   Physical Exam BP 146/80  Pulse 57  Temp(Src) 97.8 F (36.6 C) (Oral)  Wt 264 lb (119.75 kg)  BMI 34.84 kg/m2  SpO2 97%  General -- alert, well-developed    HEENT -- not pale Lungs -- normal respiratory effort, no intercostal retractions, no accessory muscle use, and normal breath sounds.   Heart-- normal rate, regular rhythm, no murmur, and no gallop.   extremities-- no pretibial edema bilaterally  Neurologic-- alert &  oriented X3 and strength normal in all extremities. Psych-- Cognition and judgment appear intact. Alert and cooperative with normal attention span and concentration.  not anxious appearing and not depressed appearing.      Assessment & Plan:   Requests a shingles shot: done

## 2012-11-30 ENCOUNTER — Telehealth: Payer: Self-pay | Admitting: Internal Medicine

## 2012-11-30 ENCOUNTER — Telehealth: Payer: Self-pay | Admitting: *Deleted

## 2012-11-30 NOTE — Telephone Encounter (Signed)
Advise patient, cholesterol is well-controlled, triglycerides are still a little elevated, for now we'll continue with same medications. Also, please call the cardiology office, could he be seen sooner than  01/12/2013, he is having chest discomfort, angina?

## 2012-11-30 NOTE — Telephone Encounter (Signed)
Error

## 2012-12-01 NOTE — Telephone Encounter (Signed)
Called cardiology & rescheduled pt's appt for April 24 @1150 . Discussed results with pt's wife.

## 2012-12-01 NOTE — Telephone Encounter (Signed)
thx

## 2012-12-15 ENCOUNTER — Encounter (HOSPITAL_COMMUNITY)
Admission: AD | Disposition: A | Discharge status: Home / Self Care | Payer: Self-pay | Source: Ambulatory Visit | Attending: Cardiology

## 2012-12-15 ENCOUNTER — Ambulatory Visit (INDEPENDENT_AMBULATORY_CARE_PROVIDER_SITE_OTHER): Payer: BC Managed Care – PPO | Admitting: Physician Assistant

## 2012-12-15 ENCOUNTER — Observation Stay (HOSPITAL_COMMUNITY)
Admission: AD | Admit: 2012-12-15 | Discharge: 2012-12-16 | DRG: 112 | Disposition: A | Discharge status: Home / Self Care | Payer: BC Managed Care – PPO | Source: Ambulatory Visit | Attending: Cardiology | Admitting: Cardiology

## 2012-12-15 ENCOUNTER — Observation Stay (HOSPITAL_COMMUNITY): Payer: BC Managed Care – PPO

## 2012-12-15 ENCOUNTER — Encounter: Payer: Self-pay | Admitting: Physician Assistant

## 2012-12-15 VITALS — BP 152/78 | HR 62 | Ht 73.0 in | Wt 265.0 lb

## 2012-12-15 DIAGNOSIS — D649 Anemia, unspecified: Secondary | ICD-10-CM | POA: Insufficient documentation

## 2012-12-15 DIAGNOSIS — I2510 Atherosclerotic heart disease of native coronary artery without angina pectoris: Secondary | ICD-10-CM

## 2012-12-15 DIAGNOSIS — E118 Type 2 diabetes mellitus with unspecified complications: Secondary | ICD-10-CM | POA: Insufficient documentation

## 2012-12-15 DIAGNOSIS — R079 Chest pain, unspecified: Secondary | ICD-10-CM

## 2012-12-15 DIAGNOSIS — R002 Palpitations: Secondary | ICD-10-CM

## 2012-12-15 DIAGNOSIS — E785 Hyperlipidemia, unspecified: Secondary | ICD-10-CM | POA: Diagnosis present

## 2012-12-15 DIAGNOSIS — I252 Old myocardial infarction: Secondary | ICD-10-CM | POA: Insufficient documentation

## 2012-12-15 DIAGNOSIS — Z9861 Coronary angioplasty status: Secondary | ICD-10-CM | POA: Insufficient documentation

## 2012-12-15 DIAGNOSIS — I200 Unstable angina: Secondary | ICD-10-CM

## 2012-12-15 DIAGNOSIS — Z794 Long term (current) use of insulin: Secondary | ICD-10-CM | POA: Insufficient documentation

## 2012-12-15 DIAGNOSIS — I10 Essential (primary) hypertension: Secondary | ICD-10-CM

## 2012-12-15 DIAGNOSIS — Y831 Surgical operation with implant of artificial internal device as the cause of abnormal reaction of the patient, or of later complication, without mention of misadventure at the time of the procedure: Secondary | ICD-10-CM | POA: Insufficient documentation

## 2012-12-15 DIAGNOSIS — Z79899 Other long term (current) drug therapy: Secondary | ICD-10-CM | POA: Insufficient documentation

## 2012-12-15 DIAGNOSIS — E119 Type 2 diabetes mellitus without complications: Secondary | ICD-10-CM

## 2012-12-15 DIAGNOSIS — K219 Gastro-esophageal reflux disease without esophagitis: Secondary | ICD-10-CM | POA: Insufficient documentation

## 2012-12-15 DIAGNOSIS — I2589 Other forms of chronic ischemic heart disease: Secondary | ICD-10-CM | POA: Insufficient documentation

## 2012-12-15 DIAGNOSIS — E1165 Type 2 diabetes mellitus with hyperglycemia: Secondary | ICD-10-CM | POA: Diagnosis present

## 2012-12-15 DIAGNOSIS — T82897A Other specified complication of cardiac prosthetic devices, implants and grafts, initial encounter: Principal | ICD-10-CM | POA: Insufficient documentation

## 2012-12-15 DIAGNOSIS — Z7902 Long term (current) use of antithrombotics/antiplatelets: Secondary | ICD-10-CM | POA: Insufficient documentation

## 2012-12-15 DIAGNOSIS — E1159 Type 2 diabetes mellitus with other circulatory complications: Secondary | ICD-10-CM | POA: Diagnosis present

## 2012-12-15 HISTORY — PX: PERCUTANEOUS CORONARY STENT INTERVENTION (PCI-S): SHX5485

## 2012-12-15 HISTORY — PX: LEFT HEART CATHETERIZATION WITH CORONARY ANGIOGRAM: SHX5451

## 2012-12-15 LAB — CBC WITH DIFFERENTIAL/PLATELET
Eosinophils Relative: 4 % (ref 0–5)
HCT: 36.2 % — ABNORMAL LOW (ref 39.0–52.0)
Hemoglobin: 12.5 g/dL — ABNORMAL LOW (ref 13.0–17.0)
Lymphocytes Relative: 20 % (ref 12–46)
MCV: 82.1 fL (ref 78.0–100.0)
Monocytes Absolute: 0.9 10*3/uL (ref 0.1–1.0)
Monocytes Relative: 9 % (ref 3–12)
Neutro Abs: 6.8 10*3/uL (ref 1.7–7.7)
RDW: 13.9 % (ref 11.5–15.5)
WBC: 10.1 10*3/uL (ref 4.0–10.5)

## 2012-12-15 LAB — POCT I-STAT, CHEM 8
BUN: 8 mg/dL (ref 6–23)
Chloride: 106 mEq/L (ref 96–112)
Creatinine, Ser: 0.6 mg/dL (ref 0.50–1.35)
Hemoglobin: 12.9 g/dL — ABNORMAL LOW (ref 13.0–17.0)
Potassium: 3.7 mEq/L (ref 3.5–5.1)
Sodium: 142 mEq/L (ref 135–145)

## 2012-12-15 LAB — COMPREHENSIVE METABOLIC PANEL
BUN: 10 mg/dL (ref 6–23)
CO2: 27 mEq/L (ref 19–32)
Calcium: 9.5 mg/dL (ref 8.4–10.5)
Chloride: 105 mEq/L (ref 96–112)
Creatinine, Ser: 0.7 mg/dL (ref 0.50–1.35)
GFR calc Af Amer: >90 mL/min (ref >90)
GFR calc non Af Amer: >90 mL/min (ref >90)
Glucose, Bld: 86 mg/dL (ref 70–99)
Total Bilirubin: 0.4 mg/dL (ref 0.3–1.2)

## 2012-12-15 LAB — GLUCOSE, CAPILLARY: Glucose-Capillary: 84 mg/dL (ref 70–99)

## 2012-12-15 LAB — POCT ACTIVATED CLOTTING TIME: Activated Clotting Time: 225 seconds

## 2012-12-15 LAB — PROTIME-INR
INR: 0.96 (ref 0.00–1.49)
Prothrombin Time: 12.7 seconds (ref 11.6–15.2)

## 2012-12-15 SURGERY — LEFT HEART CATHETERIZATION WITH CORONARY ANGIOGRAM
Anesthesia: LOCAL | Site: Hand | Laterality: Right

## 2012-12-15 MED ORDER — SODIUM CHLORIDE 0.9 % IV SOLN: 250.0000 mL | INTRAVENOUS | Status: DC | PRN

## 2012-12-15 MED ORDER — NITROGLYCERIN 1 MG/10 ML FOR IR/CATH LAB
INTRA_ARTERIAL | Status: AC
  Filled 2012-12-15: qty 10

## 2012-12-15 MED ORDER — SODIUM CHLORIDE 0.9 % IV SOLN: INTRAVENOUS | Status: DC

## 2012-12-15 MED ORDER — NITROGLYCERIN 2 % TD OINT
1.0000 [in_us] | TOPICAL_OINTMENT | Freq: Four times a day (QID) | TRANSDERMAL | Status: DC
  Filled 2012-12-15: qty 30

## 2012-12-15 MED ORDER — HEPARIN (PORCINE) IN NACL 2-0.9 UNIT/ML-% IJ SOLN
INTRAMUSCULAR | Status: AC
  Filled 2012-12-15: qty 1000

## 2012-12-15 MED ORDER — SPIRONOLACTONE 12.5 MG HALF TABLET
12.5000 mg | ORAL_TABLET | Freq: Every day | ORAL | Status: DC
  Filled 2012-12-15: qty 1

## 2012-12-15 MED ORDER — ONDANSETRON HCL 4 MG/2ML IJ SOLN: 4.0000 mg | Freq: Four times a day (QID) | INTRAMUSCULAR | Status: DC | PRN

## 2012-12-15 MED ORDER — SODIUM CHLORIDE 0.9 % IJ SOLN: 3.0000 mL | Freq: Two times a day (BID) | INTRAMUSCULAR | Status: DC

## 2012-12-15 MED ORDER — ACETAMINOPHEN 325 MG PO TABS: 650.0000 mg | ORAL_TABLET | ORAL | Status: DC | PRN

## 2012-12-15 MED ORDER — HEPARIN SODIUM (PORCINE) 1000 UNIT/ML IJ SOLN
INTRAMUSCULAR | Status: AC
  Filled 2012-12-15: qty 1

## 2012-12-15 MED ORDER — SODIUM CHLORIDE 0.9 % IJ SOLN: 3.0000 mL | INTRAMUSCULAR | Status: DC | PRN

## 2012-12-15 MED ORDER — NITROGLYCERIN 0.4 MG SL SUBL: 0.4000 mg | SUBLINGUAL_TABLET | SUBLINGUAL | Status: DC | PRN

## 2012-12-15 MED ORDER — FAMOTIDINE 20 MG PO TABS
20.0000 mg | ORAL_TABLET | Freq: Two times a day (BID) | ORAL | Status: DC | PRN
  Filled 2012-12-15: qty 1

## 2012-12-15 MED ORDER — NITROGLYCERIN 0.4 MG SL SUBL
0.4000 mg | SUBLINGUAL_TABLET | SUBLINGUAL | Status: AC | PRN
  Administered 2012-12-15: 0.4 mg via SUBLINGUAL

## 2012-12-15 MED ORDER — CLOPIDOGREL BISULFATE 75 MG PO TABS
75.0000 mg | ORAL_TABLET | Freq: Every day | ORAL | Status: DC
  Administered 2012-12-16: 10:00:00 75 mg via ORAL
  Filled 2012-12-15: qty 1

## 2012-12-15 MED ORDER — MIDAZOLAM HCL 2 MG/2ML IJ SOLN
INTRAMUSCULAR | Status: AC
  Filled 2012-12-15: qty 2

## 2012-12-15 MED ORDER — INSULIN GLARGINE 100 UNIT/ML ~~LOC~~ SOLN
15.0000 [IU] | Freq: Every day | SUBCUTANEOUS | Status: DC
  Administered 2012-12-15: 22:00:00 15 [IU] via SUBCUTANEOUS
  Filled 2012-12-15 (×3): qty 0.15

## 2012-12-15 MED ORDER — VERAPAMIL HCL 2.5 MG/ML IV SOLN
INTRAVENOUS | Status: AC
  Filled 2012-12-15: qty 2

## 2012-12-15 MED ORDER — LISINOPRIL 10 MG PO TABS
10.0000 mg | ORAL_TABLET | Freq: Two times a day (BID) | ORAL | Status: DC
  Administered 2012-12-15 – 2012-12-16 (×2): 10 mg via ORAL
  Filled 2012-12-15 (×4): qty 1

## 2012-12-15 MED ORDER — ASPIRIN EC 81 MG PO TBEC
81.0000 mg | DELAYED_RELEASE_TABLET | Freq: Every day | ORAL | Status: DC
  Administered 2012-12-16: 10:00:00 81 mg via ORAL
  Filled 2012-12-15 (×2): qty 1

## 2012-12-15 MED ORDER — ATORVASTATIN CALCIUM 80 MG PO TABS
80.0000 mg | ORAL_TABLET | Freq: Every day | ORAL | Status: DC
  Administered 2012-12-15: 22:00:00 80 mg via ORAL
  Filled 2012-12-15 (×3): qty 1

## 2012-12-15 MED ORDER — LIDOCAINE HCL (PF) 1 % IJ SOLN
INTRAMUSCULAR | Status: AC
  Filled 2012-12-15: qty 30

## 2012-12-15 MED ORDER — CARVEDILOL 12.5 MG PO TABS
12.5000 mg | ORAL_TABLET | Freq: Two times a day (BID) | ORAL | Status: DC
  Administered 2012-12-15 – 2012-12-16 (×2): 12.5 mg via ORAL
  Filled 2012-12-15 (×4): qty 1

## 2012-12-15 MED ORDER — FENTANYL CITRATE 0.05 MG/ML IJ SOLN
INTRAMUSCULAR | Status: AC
  Filled 2012-12-15: qty 2

## 2012-12-15 MED ORDER — SODIUM CHLORIDE 0.9 % IV SOLN: INTRAVENOUS | Status: AC

## 2012-12-15 MED ORDER — OXYCODONE-ACETAMINOPHEN 5-325 MG PO TABS: 1.0000 | ORAL_TABLET | ORAL | Status: DC | PRN

## 2012-12-15 NOTE — Interval H&P Note (Signed)
History and Physical Interval Note:  12/15/2012 4:43 PM  Randy Castro  has presented today for surgery, with the diagnosis of Chest pain  The various methods of treatment have been discussed with the patient and family. After consideration of risks, benefits and other options for treatment, the patient has consented to  Procedure(s): LEFT HEART CATHETERIZATION WITH CORONARY ANGIOGRAM (N/A) as a surgical intervention .  The patient's history has been reviewed, patient examined, no change in status, stable for surgery.  I have reviewed the patient's chart and labs.  Questions were answered to the patient's satisfaction.     Tonny Bollman

## 2012-12-15 NOTE — CV Procedure (Signed)
   Cardiac Catheterization Procedure Note  Name: Randy Castro MRN: [id number] DOB: 03-29-1951  Procedure: Left Heart Cath, Selective Coronary Angiography, LV angiography, PTCA of the proximal LAD for treatment of severe in-stent restenosis  Indication: Unstable angina  Procedural Details:  The right wrist was prepped, draped, and anesthetized with 1% lidocaine. Using the modified Seldinger technique, a 5/6 French sheath was introduced into the right radial artery. 3 mg of verapamil was administered through the sheath, weight-based unfractionated heparin was administered intravenously. Standard Judkins catheters were used for selective coronary angiography and left ventriculography. Catheter exchanges were performed over an exchange length guidewire.  PROCEDURAL FINDINGS Hemodynamics: AO 142/15 LV 139/16   Coronary angiography: Coronary dominance: right  Left mainstem: Widely patent without significant stenosis  Left anterior descending (LAD): There is a stented segment in the proximal LAD with 90% eccentric focal stenosis seen best in the RAO caudal view. The first diagonal arising from the stented segment is patent with 50-60% ostial stenosis. Remaining portions of the mid and distal LAD are widely patent with minor irregularity.  Left circumflex (LCx): The left circumflex is patent. The obtuse marginal branches are patent. The vessel is dominant and gives off a PDA and 2 posterolateral branches without significant stenoses. There are minor irregularities throughout.  Right coronary artery (RCA): The RCA was nonselectively injected. The vessel is small and nondominant without significant stenosis.  Left ventriculography: Left ventricular systolic function is normal, LVEF is estimated at 55-65%, there is no significant mitral regurgitation   PCI Note:  Following the diagnostic procedure, the decision was made to proceed with PCI. Weight-based heparin was given for anticoagulation. Once a  therapeutic ACT was achieved, a 6 [place] XB-LAD guide catheter was inserted.  A [place] coronary guidewire was used to cross the lesion.  The prowater wire was used to seat the guide in the left main. The lesion was within a previously placed stent so I elected to treat it with cutting balloon angioplasty. The lesion was predilated with a 3.0x10 mm cutting balloon.  There was residual stenosis noted within the stented segment. The lesion was then dilated with a 3.5 x 10 mm noncompliant balloon to 20 atmospheres.    Following PCI, there was 0% residual stenosis and TIMI-3 flow. Final angiography confirmed an excellent result in the main and LAD branch.  The diagonal branch arising from the stented segment showed eccentric 70% ostial stenosis unchanged from the old study. The patient tolerated the procedure well. There were no immediate procedural complications. A TR band was used for radial hemostasis. The patient was transferred to the post catheterization recovery area for further monitoring.  PCI Data: Vessel - LAD/Segment - proximal, in-stent restenosis Percent Stenosis (pre)  90 TIMI-flow 3 Stent none, treated with a 3.5 x 10 mm noncompliant balloon Percent Stenosis (post) 0 TIMI-flow (post) 3  Final Conclusions:   1. 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 Wide patency of the left circumflex which is a dominant vessel 3. No significant stenosis of the small nondominant RCA 4. Normal LV systolic function   Recommendations:  Continue dual antiplatelet therapy with aspirin and Plavix.  Tonny Bollman 12/15/2012, 5:45 PM

## 2012-12-15 NOTE — H&P (Signed)
History and Physical  Date:  12/15/2012   ID:  Randy Haws., DOB 12/02/1950, MRN [id number]  PCP:  Willow Ora, MD  Primary Cardiologist:  Dr. [name]     History of Present Illness: Randy Castro. is a 62 y.o. male who returns for the evaluation of CP.  He has a hx of CAD, DM2, HTN.  He had a NSTEMI in 12/12. He had DES to mid LAD. EF was 40% with apical hypokinesis at the time of the MI.  Repeat echo in 3/13 showed recovery of LV systolic function, EF 55-60%.  Last seen by Dr. [name] in 06/2012.  At a recent visit with PCP, he noted CP and was set up for f/u today.  Patient notes substernal chest tightness/heaviness over the last 2-3 weeks.  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  He has noted symptoms with heavy lifting and going up steps.  He has assoc dyspnea and diaphoresis.  No radiation.  No syncope.  He has noted some dizziness.  CP seems to be getting worse.  He was given Imdur by Dr. Drue Novel.  It has not helped.  He notes heaviness in his chest in the exam room today.     Labs (1/13): LDL 47, HDL 35, K 3.7, creatinine 1.0  Labs (2/13): K 4.1, creatinine 0.7  Labs (5/13): K 4.5, creatinine 0.8, LDL 40, HDL 34  Labs (4/14): K 3.8, Cr 0.8, Hgb 12.9   Wt Readings from Last 3 Encounters:  12/15/12 265 lb (120.203 kg)  11/28/12 264 lb (119.75 kg)  11/17/12 263 lb (119.296 kg)     Past Medical History  Diagnosis Date  . Diabetes mellitus dx in the 90s  . Hyperlipidemia   . HTN (hypertension)   . Arthritis   . CAD (coronary artery disease)     NSTEMI 12/12: LHC 08/06/11: mLAD 99%, ant HK with 40-45%.  PCI:  Cutting balloon PTCA + Promus DES x 1 to mid LAD.  DAPT recommended x 1 year  . Ischemic cardiomyopathy     a.  echo 08/06/11: dist ant wall, apical and septal and infero-apical HK, mild LVH, EF 40%, mild LAE, PASP 34, asc aorta mildly dilated, mild TR;  b.  Echo (3/13) showed recovery of LV systolic function with EF 55-60%, grade I diastolic dysfunction,  mild MR.     Current Outpatient Prescriptions  Medication Sig Dispense Refill  . atorvastatin (LIPITOR) 80 MG tablet Take 1 tablet (80 mg total) by mouth daily.  90 tablet  3  . carvedilol (COREG) 12.5 MG tablet Take 1 tablet (12.5 mg total) by mouth 2 (two) times daily.  180 tablet  1  . clopidogrel (PLAVIX) 75 MG tablet Take 1 tablet (75 mg total) by mouth daily.  90 tablet  3  . clotrimazole-betamethasone (LOTRISONE) cream Apply topically 2 (two) times daily as needed.  45 g  2  . famotidine (PEPCID) 20 MG tablet Take 1 tablet (20 mg total) by mouth 2 (two) times daily as needed for heartburn.  180 tablet  3  . insulin glargine (LANTUS SOLOSTAR) 100 UNIT/ML injection Inject 0.15 mLs (15 Units total) into the skin at bedtime.  5 pen  PRN  . isosorbide mononitrate (IMDUR) 30 MG 24 hr tablet Take 1 tablet (30 mg total) by mouth daily.  30 tablet  6  . lisinopril (PRINIVIL,ZESTRIL) 10 MG tablet Take 1 tablet (10 mg total) by mouth 2 (two) times daily.  180 tablet  3  . MAGNESIUM PO Take 2 tablets by mouth daily.       . Multiple Vitamins-Minerals (MULTIVITAMINS THER. W/MINERALS) TABS Take 1 tablet by mouth daily.        Marland Kitchen NITROSTAT 0.4 MG SL tablet PLACE 1 TAB UNDER TONGUE EVERY 5 MINS AS NEEDED FOR CHEST PAIN  25 tablet  0  . SitaGLIPtin-MetFORMIN HCl (JANUMET XR) 50-1000 MG TB24 Take 1 tablet by mouth 2 (two) times daily.  180 tablet  0  . spironolactone (ALDACTONE) 25 MG tablet Take 0.5 tablets (12.5 mg total) by mouth daily.  45 tablet  3   No current facility-administered medications for this visit.    Allergies:   No Known Allergies  Social History:  The patient  reports that he quit smoking about 20 years ago. He has never used smokeless tobacco. He reports that  drinks alcohol. He reports that he does not use illicit drugs.   Family History:  The patient's family history includes Coronary artery disease in an unspecified family member and Diabetes in an unspecified family member.   There is no history of Colon cancer, and Prostate cancer, and Stroke, .   ROS:  Please see the history of present illness.   He does note indigestion that is relieve with pepcid.  He does snore.  No daytime hypersomnolence.   All other systems reviewed and negative.   PHYSICAL EXAM: VS:  BP 152/78  Pulse 62  Ht 6\' 1"  (1.854 m)  Wt 265 lb (120.203 kg)  BMI 34.97 kg/m2 Well nourished, well developed, in no acute distress HEENT: normal Neck: no JVD Cardiac:  normal S1, S2; RRR; no murmur Lungs:  clear to auscultation bilaterally, no wheezing, rhonchi or rales Abd: soft, nontender, no hepatomegaly Ext: no edema Skin: warm and dry Neuro:  CNs 2-12 intact, no focal abnormalities noted  EKG:  NSR, HR 62, LAD, iRBBB, NSSTTW changes     ASSESSMENT AND PLAN:  1. Chest Pain:  Symptoms are concerning for angina.  They are reminiscent of his prior angina but not as bad.  Nitrates have not helped.  ECG is not acute.  He is having CP in the office today.  I have recommended admission to the hospital.  Plan cardiac cath today or tomorrow.  Risks and benefits of cardiac catheterization have been discussed with the patient.  These include bleeding, infection, kidney damage, stroke, heart attack, death.  The patient understands these risks and is willing to proceed.  Check serial markers.  Start heparin if CEs abnormal or worsening CP.  Will place him on NTP.  Patient also seen by Dr. Rollene Rotunda (DOD).   2. CAD:  Continue ASA and Plavix.  Proceed with admission and cardiac cath as noted.  If LAD stent re-stenosed will need to check P2Y12.   3. Hypertension:  BP elevated.  Will monitor and make changes accordingly. 4. Hyperlipidemia:  Continue statin. 5. Diabetes Mellitus:  Hold Janumet (Metformin) for cath.  Of note he did take this today.  Creatinine has been normal in the past.  Cover with SSI. 6. GERD:  Continue H2RA. 7. Disposition:  Admit to [facility] today.  Plan cardiac cath.   Luna Glasgow, PA-C  12:13 PM 12/15/2012      [address] [place], [place]  [address] [phone]  History and all data above reviewed.  Patient examined.  I agree with the findings as above.  He has symptoms consistent with  unstable angina and similar to his previous symptoms.  The patient exam reveals COR:RRR  ,  Lungs: Clear  ,  Abd: Positive bowel sounds, no rebound no guarding, Ext No edema  .  All available labs, radiology testing, previous records reviewed. Agree with documented assessment and plan. Given the high pretest probability of obstructive coronary disease cardiac catheterization will be indicated.   Randy Castro  1:47 PM  12/15/2012

## 2012-12-15 NOTE — Progress Notes (Signed)
[address], [address] [place], [place]  [address] Phone: [phone] Fax:  [phone]  Date:  12/15/2012   ID:  Randy Haws., DOB 07-Jun-1951, MRN [id number]  PCP:  Willow Ora, MD  Primary Cardiologist:  Dr. [name]     History of Present Illness: Randy Crisco. is a 62 y.o. male who returns for the evaluation of CP.  He has a hx of CAD, DM2, HTN.  He had a NSTEMI in 12/12. He had DES to mid LAD. EF was 40% with apical hypokinesis at the time of the MI.  Repeat echo in 3/13 showed recovery of LV systolic function, EF 55-60%.  Last seen by Dr. [name] in 06/2012.  At a recent visit with PCP, he noted CP and was set up for f/u today.  Patient notes substernal chest tightness/heaviness over the last 2-3 weeks.  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  He has noted symptoms with heavy lifting and going up steps.  He has assoc dyspnea and diaphoresis.  No radiation.  No syncope.  He has noted some dizziness.  CP seems to be getting worse.  He was given Imdur by Dr. Drue Novel.  It has not helped.  He notes heaviness in his chest in the exam room today.     Labs (1/13): LDL 47, HDL 35, K 3.7, creatinine 1.0  Labs (2/13): K 4.1, creatinine 0.7  Labs (5/13): K 4.5, creatinine 0.8, LDL 40, HDL 34  Labs (4/14): K 3.8, Cr 0.8, Hgb 12.9   Wt Readings from Last 3 Encounters:  12/15/12 265 lb (120.203 kg)  11/28/12 264 lb (119.75 kg)  11/17/12 263 lb (119.296 kg)     Past Medical History  Diagnosis Date  . Diabetes mellitus dx in the 90s  . Hyperlipidemia   . HTN (hypertension)   . Arthritis   . CAD (coronary artery disease)     NSTEMI 12/12: LHC 08/06/11: mLAD 99%, ant HK with 40-45%.  PCI:  Cutting balloon PTCA + Promus DES x 1 to mid LAD.  DAPT recommended x 1 year  . Ischemic cardiomyopathy     a.  echo 08/06/11: dist ant wall, apical and septal and infero-apical HK, mild LVH, EF 40%, mild LAE, PASP 34, asc aorta mildly dilated, mild TR;  b.  Echo (3/13)  showed recovery of LV systolic function with EF 55-60%, grade I diastolic dysfunction, mild MR.     Current Outpatient Prescriptions  Medication Sig Dispense Refill  . atorvastatin (LIPITOR) 80 MG tablet Take 1 tablet (80 mg total) by mouth daily.  90 tablet  3  . carvedilol (COREG) 12.5 MG tablet Take 1 tablet (12.5 mg total) by mouth 2 (two) times daily.  180 tablet  1  . clopidogrel (PLAVIX) 75 MG tablet Take 1 tablet (75 mg total) by mouth daily.  90 tablet  3  . clotrimazole-betamethasone (LOTRISONE) cream Apply topically 2 (two) times daily as needed.  45 g  2  . famotidine (PEPCID) 20 MG tablet Take 1 tablet (20 mg total) by mouth 2 (two) times daily as needed for heartburn.  180 tablet  3  . insulin glargine (LANTUS SOLOSTAR) 100 UNIT/ML injection Inject 0.15 mLs (15 Units total) into the skin at bedtime.  5 pen  PRN  . isosorbide mononitrate (IMDUR) 30 MG 24 hr tablet Take 1 tablet (30 mg total) by mouth daily.  30 tablet  6  . lisinopril (PRINIVIL,ZESTRIL) 10 MG  tablet Take 1 tablet (10 mg total) by mouth 2 (two) times daily.  180 tablet  3  . MAGNESIUM PO Take 2 tablets by mouth daily.       . Multiple Vitamins-Minerals (MULTIVITAMINS THER. W/MINERALS) TABS Take 1 tablet by mouth daily.        Marland Kitchen NITROSTAT 0.4 MG SL tablet PLACE 1 TAB UNDER TONGUE EVERY 5 MINS AS NEEDED FOR CHEST PAIN  25 tablet  0  . SitaGLIPtin-MetFORMIN HCl (JANUMET XR) 50-1000 MG TB24 Take 1 tablet by mouth 2 (two) times daily.  180 tablet  0  . spironolactone (ALDACTONE) 25 MG tablet Take 0.5 tablets (12.5 mg total) by mouth daily.  45 tablet  3   No current facility-administered medications for this visit.    Allergies:   No Known Allergies  Social History:  The patient  reports that he quit smoking about 20 years ago. He has never used smokeless tobacco. He reports that  drinks alcohol. He reports that he does not use illicit drugs.   Family History:  The patient's family history includes Coronary artery  disease in an unspecified family member and Diabetes in an unspecified family member.  There is no history of Colon cancer, and Prostate cancer, and Stroke, .   ROS:  Please see the history of present illness.   He does note indigestion that is relieve with pepcid.  He does snore.  No daytime hypersomnolence.   All other systems reviewed and negative.   PHYSICAL EXAM: VS:  BP 152/78  Pulse 62  Ht 6\' 1"  (1.854 m)  Wt 265 lb (120.203 kg)  BMI 34.97 kg/m2 Well nourished, well developed, in no acute distress HEENT: normal Neck: no JVD Cardiac:  normal S1, S2; RRR; no murmur Lungs:  clear to auscultation bilaterally, no wheezing, rhonchi or rales Abd: soft, nontender, no hepatomegaly Ext: no edema Skin: warm and dry Neuro:  CNs 2-12 intact, no focal abnormalities noted  EKG:  NSR, HR 62, LAD, iRBBB, NSSTTW changes     ASSESSMENT AND PLAN:  1. Chest Pain:  Symptoms are concerning for angina.  They are reminiscent of his prior angina but not as bad.  Nitrates have not helped.  ECG is not acute.  He is having CP in the office today.  I have recommended admission to the hospital.  Plan cardiac cath today or tomorrow.  Risks and benefits of cardiac catheterization have been discussed with the patient.  These include bleeding, infection, kidney damage, stroke, heart attack, death.  The patient understands these risks and is willing to proceed.  Check serial markers.  Start heparin if CEs abnormal or worsening CP.  Will place him on NTP.  Patient also seen by Dr. Rollene Rotunda (DOD).   2. CAD:  Continue ASA and Plavix.  Proceed with admission and cardiac cath as noted.  If LAD stent re-stenosed will need to check P2Y12.   3. Hypertension:  BP elevated.  Will monitor and make changes accordingly. 4. Hyperlipidemia:  Continue statin. 5. Diabetes Mellitus:  Hold Janumet (Metformin) for cath.  Of note he did take this today.  Creatinine has been normal in the past.  Cover with SSI. 6. GERD:  Continue  H2RA. 7. Disposition:  Admit to [facility] today.  Plan cardiac cath.   Luna Glasgow, PA-C  12:13 PM 12/15/2012

## 2012-12-15 NOTE — Patient Instructions (Signed)
PT ADMITTED TO [facility]; DX UNSTABLE ANGINA

## 2012-12-16 ENCOUNTER — Encounter (HOSPITAL_COMMUNITY): Payer: Self-pay | Admitting: *Deleted

## 2012-12-16 DIAGNOSIS — I200 Unstable angina: Secondary | ICD-10-CM

## 2012-12-16 DIAGNOSIS — I10 Essential (primary) hypertension: Secondary | ICD-10-CM

## 2012-12-16 DIAGNOSIS — I2510 Atherosclerotic heart disease of native coronary artery without angina pectoris: Secondary | ICD-10-CM

## 2012-12-16 LAB — BASIC METABOLIC PANEL
BUN: 10 mg/dL (ref 6–23)
Chloride: 104 mEq/L (ref 96–112)
GFR calc Af Amer: >90 mL/min (ref >90)
Potassium: 3.4 mEq/L — ABNORMAL LOW (ref 3.5–5.1)

## 2012-12-16 LAB — CBC
HCT: 34.5 % — ABNORMAL LOW (ref 39.0–52.0)
RBC: 4.26 MIL/uL (ref 4.22–5.81)
RDW: 14.1 % (ref 11.5–15.5)
WBC: 6.8 10*3/uL (ref 4.0–10.5)

## 2012-12-16 LAB — GLUCOSE, CAPILLARY: Glucose-Capillary: 206 mg/dL — ABNORMAL HIGH (ref 70–99)

## 2012-12-16 LAB — TROPONIN I: Troponin I: <0.3 ng/mL (ref <0.30)

## 2012-12-16 LAB — PLATELET INHIBITION P2Y12: Platelet Function  P2Y12: 157 [PRU] — ABNORMAL LOW (ref 194–418)

## 2012-12-16 MED ORDER — NITROGLYCERIN 0.4 MG SL SUBL: 0.4000 mg | SUBLINGUAL_TABLET | SUBLINGUAL | Status: DC | PRN

## 2012-12-16 MED ORDER — FAMOTIDINE 20 MG PO TABS: 20.0000 mg | ORAL_TABLET | Freq: Two times a day (BID) | ORAL | Status: DC | PRN

## 2012-12-16 MED ORDER — SITAGLIP PHOS-METFORMIN HCL ER 50-1000 MG PO TB24: 1.0000 | ORAL_TABLET | Freq: Two times a day (BID) | ORAL | Status: DC

## 2012-12-16 NOTE — Discharge Summary (Signed)
Patient seen and examined and history reviewed. Agree with above findings and plan. See my earlier note.   Randy Castro 12/16/2012 12:32 PM

## 2012-12-16 NOTE — Progress Notes (Signed)
   TELEMETRY: Reviewed telemetry pt in NSR: Filed Vitals:   12/15/12 2100 12/15/12 2136 12/16/12 0020 12/16/12 0500  BP: 127/51 135/51 131/50 127/62  Pulse: 60  52 52  Temp:   97.4 F (36.3 C) 97.5 F (36.4 C)  TempSrc:   Oral Oral  Resp:   18 18  Height:      Weight:   261 lb 11 oz (118.7 kg)   SpO2: 95%  95% 95%    Intake/Output Summary (Last 24 hours) at 12/16/12 1610 Last data filed at 12/16/12 0600  Gross per 24 hour  Intake   1005 ml  Output      0 ml  Net   1005 ml    SUBJECTIVE Feels well. No chest pain or SOB.  LABS: Basic Metabolic Panel:  Recent Labs  96/04/54 1644 12/15/12 1654 12/16/12 0200  NA 142 142 138  K 4.0 3.7 3.4*  CL 105 106 104  CO2 27  --  27  GLUCOSE 86 99 147*  BUN 10 8 10   CREATININE 0.70 0.60 0.66  CALCIUM 9.5  --  8.7   Liver Function Tests:  Recent Labs  12/15/12 1644  AST 27  ALT 35  ALKPHOS 76  BILITOT 0.4  PROT 6.8  ALBUMIN 3.8   CBC:  Recent Labs  12/15/12 1644 12/15/12 1654 12/16/12 0200  WBC 10.1  --  6.8  NEUTROABS 6.8  --   --   HGB 12.5* 12.9* 12.0*  HCT 36.2* 38.0* 34.5*  MCV 82.1  --  81.0  PLT 223  --  206   Cardiac Enzymes:  Recent Labs  12/15/12 1644 12/15/12 1942 12/16/12 0200  TROPONINI <0.30 <0.30 <0.30   Radiology/Studies:  X-ray Chest Pa And Lateral  12/15/2012  *RADIOLOGY REPORT*  Clinical Data: Chest pain  CHEST - 2 VIEW  Comparison: August 06, 2011.  Findings: Cardiomediastinal silhouette appears normal.  No acute pulmonary disease is noted.  Bony thorax is intact.  IMPRESSION: No acute cardiopulmonary abnormality seen.   Original Report Authenticated By: Lupita Raider.,  M.D.    ECG: sinus brady, LVH. No acute changes.  PHYSICAL EXAM General: Well developed, well nourished, in no acute distress. Head: Normocephalic, atraumatic, sclera non-icteric, no xanthomas, nares are without discharge. Neck: Negative for carotid bruits. JVD not elevated. Lungs: Clear bilaterally to  auscultation without wheezes, rales, or rhonchi. Breathing is unlabored. Heart: RRR S1 S2 without murmurs, rubs, or gallops.  Abdomen: Soft, non-tender, non-distended with normoactive bowel sounds. No hepatomegaly. No rebound/guarding. No obvious abdominal masses. Msk:  Strength and tone appears normal for age. Extremities: No clubbing, cyanosis or edema.  Distal pedal pulses are 2+ and equal bilaterally. No radial hematoma. Neuro: Alert and oriented X 3. Moves all extremities spontaneously. Psych:  Responds to questions appropriately with a normal affect.  ASSESSMENT AND PLAN: 1. Unstable angina. S/p PTCA of LAD for instent restenosis. Continue ASA and Plavix. OK for discharge today. Patient does heavy lifting at work so should stay out of work until May 5.  2. DM hold metformin for 48 hours post cath. 3. HTN controlled. 4. Hyperlipidemia. On high dose lipitor.  Principal Problem:   Unstable angina Active Problems:   DM (diabetes mellitus) with complications   Hyperlipidemia   HTN (hypertension)   Coronary Artery Disease    Leonides Schanz Peter [place] MD,FACC 12/16/2012 7:22 AM

## 2012-12-16 NOTE — Discharge Summary (Signed)
CARDIOLOGY DISCHARGE SUMMARY   Patient ID: Randy Castro MRN: [id number] DOB/AGE: 01/04/1951 62 y.o.  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 male with a history of CAD. He had a drug-eluting stent to the mid LAD in December of 2012. He had chest pain that was increasing in frequency and intensity. He was seen in the office and was having chest heaviness consistent with angina. He was admitted for further evaluation and treatment.  He was taken to the cath lab on 12/15/2012. Full results are below. 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 He tolerated the procedure well.  His cardiac enzymes were cycled and were negative for MI. He was mildly anemic post-procedure and this will be followed as an outpatient. His blood sugars were managed with a combination of home insulin and sliding scale insulin. His metformin was held.  On 12/16/2012, he was seen by cardiac rehabilitation and by Dr. [place]. His cath site was without hematoma. He was ambulating without chest pain or shortness of breath and considered stable for discharge, to follow up as an outpatient.  Labs:  Lab Results  Component Value Date   WBC 6.8 12/16/2012   HGB 12.0* 12/16/2012   HCT 34.5* 12/16/2012   MCV 81.0 12/16/2012   PLT 206 12/16/2012    Recent Labs Lab 12/15/12 1644  12/16/12 0200  NA 142  < > 138  K 4.0  < > 3.4*  CL 105  < >  104  CO2 27  --  27  BUN 10  < > 10  CREATININE 0.70  < > 0.66  CALCIUM 9.5  --  8.7  PROT 6.8  --   --   BILITOT 0.4  --   --   ALKPHOS 76  --   --   ALT 35  --   --   AST 27  --   --   GLUCOSE 86  < > 147*  < > = values in this interval not displayed.  Recent Labs  12/15/12 1644 12/15/12 1942 12/16/12 0200  TROPONINI <0.30 <0.30 <0.30    Recent Labs  12/15/12 1644  INR 0.96      Radiology: X-ray Chest Pa And Lateral 12/15/2012  *RADIOLOGY REPORT*  Clinical Data: Chest pain  CHEST - 2 VIEW  Comparison: August 06, 2011.  Findings: Cardiomediastinal silhouette appears normal.  No acute pulmonary disease is noted.  Bony thorax is intact.  IMPRESSION: No acute cardiopulmonary abnormality seen.   Original Report Authenticated By: Lupita Raider.,  M.D.    Cardiac Cath:   Left mainstem: Widely patent without significant stenosis  Left anterior descending (LAD): There is a stented segment in the proximal LAD with 90% eccentric focal stenosis seen best in the RAO caudal view. The first diagonal arising from the stented segment is patent with 50-60% ostial stenosis. Remaining portions of the mid and distal LAD are widely patent with minor irregularity.  Left circumflex (  LCx): The left circumflex is patent. The obtuse marginal branches are patent. The vessel is dominant and gives off a PDA and 2 posterolateral branches without significant stenoses. There are minor irregularities throughout.  Right coronary artery (RCA): The RCA was nonselectively injected. The vessel is small and nondominant without significant stenosis.  Left ventriculography: Left ventricular systolic function is normal, LVEF is estimated at 55-65%, there is no significant mitral regurgitation   PCI Data:  Vessel - LAD/Segment - proximal, in-stent restenosis  Percent Stenosis (pre) 90  TIMI-flow 3  Stent none, treated with a 3.5 x 10 mm noncompliant balloon  Percent Stenosis (post) 0  TIMI-flow (post) 3  Final  Conclusions:  1. Severe in-stent restenosis in the proximal LAD, treated successfully with cutting balloon angioplasty followed by noncompliant balloon angioplasty  2. Wide patency of the left circumflex which is a dominant vessel  3. No significant stenosis of the small nondominant RCA  4. Normal LV systolic function  Recommendations:  Continue dual antiplatelet therapy with aspirin and Plavix.  EKG:  16-Dec-2012 05:29:25  Sinus bradycardia with sinus arrhythmia Minimal voltage criteria for LVH, may be normal variant Vent. rate 55 BPM PR interval 208 ms QRS duration 110 ms QT/QTc 422/403 ms P-R-T axes 9 -25 -2  FOLLOW UP PLANS AND APPOINTMENTS No Known Allergies   Medication List    TAKE these medications       acetaminophen 325 MG tablet  Commonly known as:  TYLENOL  Take 650 mg by mouth every 6 (six) hours as needed for pain.     aspirin 81 MG tablet  Take 81 mg by mouth daily.     atorvastatin 80 MG tablet  Commonly known as:  LIPITOR  Take 1 tablet (80 mg total) by mouth daily.     carvedilol 12.5 MG tablet  Commonly known as:  COREG  Take 1 tablet (12.5 mg total) by mouth 2 (two) times daily.     clopidogrel 75 MG tablet  Commonly known as:  PLAVIX  Take 1 tablet (75 mg total) by mouth daily.     clotrimazole-betamethasone cream  Commonly known as:  LOTRISONE  Apply topically 2 (two) times daily as needed.     famotidine 20 MG tablet  Commonly known as:  PEPCID  Take 1 tablet (20 mg total) by mouth 2 (two) times daily as needed for heartburn.     insulin glargine 100 UNIT/ML injection  Commonly known as:  LANTUS SOLOSTAR  Inject 0.15 mLs (15 Units total) into the skin at bedtime.     isosorbide mononitrate 30 MG 24 hr tablet  Commonly known as:  IMDUR  Take 1 tablet (30 mg total) by mouth daily.     lisinopril 10 MG tablet  Commonly known as:  PRINIVIL,ZESTRIL  Take 1 tablet (10 mg total) by mouth 2 (two) times daily.     MAGNESIUM PO  Take 1  tablet by mouth daily.     multivitamins ther. w/minerals Tabs  Take 1 tablet by mouth daily.     nitroGLYCERIN 0.4 MG SL tablet  Commonly known as:  NITROSTAT  Place 1 tablet (0.4 mg total) under the tongue every 5 (five) minutes as needed for chest pain.     SitaGLIPtin-MetFORMIN HCl 50-1000 MG Tb24  Commonly known as:  JANUMET XR  Take 1 tablet by mouth 2 (two) times daily. HOLD for 48 hours. Resume on 12/18/2012.     spironolactone 25 MG tablet  Commonly known as:  ALDACTONE  Take 0.5 tablets (12.5 mg total) by mouth daily.        Discharge Orders   Future Appointments Provider Department Dept Phone   12/20/2012 8:00 AM Carlus Pavlov, MD [facility] PRIMARY CARE ENDOCRINOLOGY [phone]   12/26/2012 2:20 PM Beatrice Lecher, PA-C [facility] Main Office [place]) [phone]   05/31/2013 8:30 AM [name], MD [facility] at  [place] [phone]   Future Orders Complete By Expires     Diet - low sodium heart healthy  As directed     Diet Carb Modified  As directed     Increase activity slowly  As directed       Follow-up Information   Follow up with Tereso Newcomer, PA-C On 12/26/2012. (See for Dr Shirlee Latch at 2:20 pm)    Contact information:   [address] [place] [address] [phone]       BRING ALL MEDICATIONS WITH YOU TO FOLLOW UP APPOINTMENTS  Time spent with patient to include physician time: 35 min Signed: Theodore Demark, PA-C 12/16/2012, 11:05 AM Co-Sign MD

## 2012-12-16 NOTE — Progress Notes (Signed)
CARDIAC REHAB PHASE I   PRE:  Rate/Rhythm: 58 SB  BP:  Supine: 132/56  Sitting:   Standing:    SaO2: 95 RS  MODE:  Ambulation: 1000 ft   POST:  Rate/Rhythm: 70 SR  BP:  Supine:   Sitting: 154/68  Standing:    SaO2:  0750-0850 Pt tolerated ambulation well without c/o of cp or SOB. VS stable. Completed discharge education with pt and wife. They voice understanding. Pt declines Outpt. CRP due to work hours.  Melina Copa RN 12/16/2012 8:53 AM

## 2012-12-16 NOTE — Progress Notes (Signed)
TR BAND REMOVAL  LOCATION:    right radial  DEFLATED PER PROTOCOL:    yes  TIME BAND OFF / DRESSING APPLIED:    21:45   SITE UPON ARRIVAL:    Level 0  SITE AFTER BAND REMOVAL:    Level 0  REVERSE ALLEN'S TEST:     positive  CIRCULATION SENSATION AND MOVEMENT:    Within Normal Limits   yes  COMMENTS:  Patient tolerated removal of TR band without problems, will continue to monitor patient.

## 2012-12-16 NOTE — Progress Notes (Signed)
Utilization Review Completed Tushar Enns J. Nikkia Devoss, RN, BSN, NCM [phone]  

## 2012-12-20 ENCOUNTER — Encounter: Payer: Self-pay | Admitting: Internal Medicine

## 2012-12-20 ENCOUNTER — Telehealth: Payer: Self-pay | Admitting: *Deleted

## 2012-12-20 ENCOUNTER — Ambulatory Visit (INDEPENDENT_AMBULATORY_CARE_PROVIDER_SITE_OTHER): Payer: BC Managed Care – PPO | Admitting: Internal Medicine

## 2012-12-20 VITALS — BP 112/74 | HR 65 | Temp 97.8°F | Resp 12 | Wt 261.0 lb

## 2012-12-20 DIAGNOSIS — E118 Type 2 diabetes mellitus with unspecified complications: Secondary | ICD-10-CM

## 2012-12-20 MED ORDER — GLUCOSE BLOOD VI STRP: ORAL_STRIP | Status: DC

## 2012-12-20 MED ORDER — INSULIN GLARGINE 100 UNIT/ML ~~LOC~~ SOLN: 18.0000 [IU] | Freq: Every day | SUBCUTANEOUS | Status: DC

## 2012-12-20 NOTE — Patient Instructions (Signed)
Please return for another visit in 3 months. Please increase the Lantus dose to 18 units. Call me or send me a message to my chart if your sugars are consistently above 200 or lower than 90.

## 2012-12-20 NOTE — Progress Notes (Signed)
Subjective:     Patient ID: Randy Castro, male   DOB: [birth year], 62 y.o.   MRN: [id number]  HPI Mr. Betzold is a pleasant 62 y/o man returning for f/u for DM2, dx 1998, uncontrolled, non-insulin dependent, with complications (CAD, s/p MI 07/2011, s/p PTCA with DES, s/p recent PTCA for in-stent restenosis 12/15/12; also ED). He is here with his wife.   He had CP/SOB for few months >> saw Dr. Antoine Poche on 12/15/3012 >> sent directly to the hospital >> heart catheterization >> PTCA for LAD instent restenosis >> feels much better now. He was off metformin for 4 days perioperatively. He did notice that his sugars were worse during this period.  He is now on a regimen of: - Lantus 15 units at night - JanuMet 50/1000 bid - they pay 229$ for this every 3 months - Glipizide 10 mg bid   He and his wife had an appt with nutrition on 08/23/12. They felt that this was very beneficial for them and they started to institute these changes. Especially since his catheterization, he started to walk and eating better. He feels he has more energy.  Lab Results  Component Value Date   HGBA1C 8.0* 11/03/2012  Previous HbA1C 7.8%, at the end of last year.  He checks his sugars approx 1/day: - am: 136-203 >> now 127-176 - before lunch: 159-240 >> 102-170, one value at 84 (lowest CBG since last visit) - before dinner: 166-254 >> 93-214, mostly in the mid-100's - bedtime: 152-279 >> 233 and 274 Highest 274. Lowest: 84. He has hypoglycemia awareness at 65.  He has no DR per last eye exam on 07/16/2012. Last ACR was 2.3 in 03/2011, normal BUN/Cr in 06/2012. No neuropathy per last foot exam 07/2012.  PMH:  Pt had a period off the meds for cost reasons and was restarted on DM meds in 03/2011. In 07/2011, he had a NSTEMI and had a DES placed. He was also dx with CHF, which is now improved (EF increased from 40-45% in 07/2011 to 55-60% in 10/2011). Sees Dr. Shirlee Latch with Cardiology. He also has HL (on Lipitor) and HTN (on  Lisinopril, Coreg, Spironolactone). His latest lipid panel (12/2011) was: 103/148/34/40. Last TSH normal, 0.55, in 07/2011.   I reviewed pt's medications, allergies, PMH, social hx, family hx and no changes required, except as above.  Review of Systems Constitutional: no weight gain/loss, no fatigue, no subjective hyperthermia/hypothermia Eyes: no blurry vision, no xerophthalmia ENT: no sore throat, no nodules palpated in throat, no dysphagia/odynophagia, no hoarseness Cardiovascular: had CP and SOB, but now resolved after his PTCA/no palpitations/leg swelling Respiratory: no cough/SOB Gastrointestinal: no N/V/D/C Musculoskeletal: no muscle/joint aches Skin: no rashes Neurological: no tremors/numbness/tingling/dizziness Psychiatric: no depression/anxiety  Objective:   Physical Exam BP 112/74  Pulse 65  Temp(Src) 97.8 F (36.6 C) (Oral)  Resp 12  Wt 261 lb (118.389 kg)  BMI 34.44 kg/m2  SpO2 95% Wt Readings from Last 3 Encounters:  12/20/12 261 lb (118.389 kg)  12/16/12 261 lb 11 oz (118.7 kg)  12/15/12 265 lb (120.203 kg)   Constitutional: overweight, in NAD Eyes: PERRLA, EOMI, no exophthalmos ENT: moist mucous membranes, no thyromegaly, no cervical lymphadenopathy Cardiovascular: RRR, No MRG Respiratory: CTA B Gastrointestinal: abdomen soft, NT, ND, BS+ Musculoskeletal: no deformities, strength intact in all 4 Skin: moist, warm, no rashes Neurological: no tremor with outstretched hands, DTR normal in all 4   Assessment:     1. DM2, non-insulin dependent, uncontrolled, with complications: -  CAD, s/p NSTEMI 07/2011, with PTCA + DES (Dr. Shirlee Latch), s/p PTCA for in-stent restenosis 12/15/2012 - CHF, EF 55-60% per 2D ECHO in 10/2011 - ED - no DR - no nephropathy 2. Class II Obesity, Body mass index is 34.83 kg/(m^2).    Plan:     Patient's sugars are much improved compared to last visit, after addition of Lantus. He also mentions that he started to improve his diet, by  cutting down sugars and fat and also by not eating out as much. He also started to exercise by walking, and he admits that in the past this is limited by not feeling well when exerting himself, which was likely due to his worsening coronary artery disease (status post PTCA for stent restenosis 5 days ago). - I congratulated him for his success, and encouraged him to continue with exercise (but not overdo it) and also with improving his diet - I will continue his glipizide 10 mg twice a day for now - Will continue the Janumet 50/1000 (I gave him samples of Janumet) - Since patient's sugars are higher then goal of less than 130 in the morning, will increase his Lantus from 15 to 18 units at bedtime.  - I gave him 2 Lantus sample pens - I gave him more sugar logs - continue prescription for strips (one touch) to his pharmacy - patient will return in 3 months with his sugar log

## 2012-12-20 NOTE — Telephone Encounter (Signed)
TCM patient. States he is doing well. No complaints offered. Taking all meds. Aware of post hospital appointment.

## 2012-12-22 ENCOUNTER — Telehealth: Payer: Self-pay | Admitting: Cardiology

## 2012-12-22 ENCOUNTER — Encounter (HOSPITAL_COMMUNITY)
Admission: EM | Disposition: A | Discharge status: Home / Self Care | Payer: Self-pay | Source: Home / Self Care | Attending: Emergency Medicine

## 2012-12-22 ENCOUNTER — Observation Stay (HOSPITAL_COMMUNITY)
Admission: EM | Admit: 2012-12-22 | Discharge: 2012-12-23 | Disposition: A | Discharge status: Home / Self Care | Payer: BC Managed Care – PPO | Attending: Emergency Medicine | Admitting: Emergency Medicine

## 2012-12-22 ENCOUNTER — Encounter (HOSPITAL_COMMUNITY): Payer: Self-pay

## 2012-12-22 ENCOUNTER — Emergency Department (HOSPITAL_COMMUNITY): Payer: BC Managed Care – PPO

## 2012-12-22 DIAGNOSIS — R0602 Shortness of breath: Secondary | ICD-10-CM | POA: Insufficient documentation

## 2012-12-22 DIAGNOSIS — E1165 Type 2 diabetes mellitus with hyperglycemia: Secondary | ICD-10-CM | POA: Diagnosis present

## 2012-12-22 DIAGNOSIS — I10 Essential (primary) hypertension: Secondary | ICD-10-CM | POA: Diagnosis present

## 2012-12-22 DIAGNOSIS — I2510 Atherosclerotic heart disease of native coronary artery without angina pectoris: Secondary | ICD-10-CM

## 2012-12-22 DIAGNOSIS — E1159 Type 2 diabetes mellitus with other circulatory complications: Secondary | ICD-10-CM | POA: Diagnosis present

## 2012-12-22 DIAGNOSIS — I200 Unstable angina: Secondary | ICD-10-CM

## 2012-12-22 DIAGNOSIS — E785 Hyperlipidemia, unspecified: Secondary | ICD-10-CM | POA: Insufficient documentation

## 2012-12-22 DIAGNOSIS — E119 Type 2 diabetes mellitus without complications: Secondary | ICD-10-CM | POA: Insufficient documentation

## 2012-12-22 DIAGNOSIS — I2589 Other forms of chronic ischemic heart disease: Secondary | ICD-10-CM | POA: Insufficient documentation

## 2012-12-22 DIAGNOSIS — Z7902 Long term (current) use of antithrombotics/antiplatelets: Secondary | ICD-10-CM | POA: Insufficient documentation

## 2012-12-22 DIAGNOSIS — I252 Old myocardial infarction: Secondary | ICD-10-CM | POA: Insufficient documentation

## 2012-12-22 HISTORY — DX: Acute myocardial infarction, unspecified: I21.9

## 2012-12-22 HISTORY — PX: LEFT HEART CATHETERIZATION WITH CORONARY ANGIOGRAM: SHX5451

## 2012-12-22 LAB — CBC WITH DIFFERENTIAL/PLATELET
Basophils Absolute: 0 10*3/uL (ref 0.0–0.1)
Lymphocytes Relative: 27 % (ref 12–46)
Neutro Abs: 3.8 10*3/uL (ref 1.7–7.7)
Platelets: 227 10*3/uL (ref 150–400)
RDW: 13.9 % (ref 11.5–15.5)
WBC: 6.5 10*3/uL (ref 4.0–10.5)

## 2012-12-22 LAB — CBC
HCT: 36.2 % — ABNORMAL LOW (ref 39.0–52.0)
MCHC: 35.1 g/dL (ref 30.0–36.0)
MCV: 81.9 fL (ref 78.0–100.0)
RDW: 13.9 % (ref 11.5–15.5)

## 2012-12-22 LAB — POCT ACTIVATED CLOTTING TIME: Activated Clotting Time: 263 seconds

## 2012-12-22 LAB — TROPONIN I
Troponin I: <0.3 ng/mL (ref <0.30)
Troponin I: <0.3 ng/mL (ref <0.30)

## 2012-12-22 LAB — GLUCOSE, CAPILLARY
Glucose-Capillary: 159 mg/dL — ABNORMAL HIGH (ref 70–99)
Glucose-Capillary: 120 mg/dL — ABNORMAL HIGH (ref 70–99)

## 2012-12-22 LAB — BASIC METABOLIC PANEL
Calcium: 9.4 mg/dL (ref 8.4–10.5)
Creatinine, Ser: 0.65 mg/dL (ref 0.50–1.35)
GFR calc Af Amer: >90 mL/min (ref >90)
GFR calc non Af Amer: >90 mL/min (ref >90)

## 2012-12-22 SURGERY — LEFT HEART CATHETERIZATION WITH CORONARY ANGIOGRAM
Anesthesia: LOCAL

## 2012-12-22 MED ORDER — ACETAMINOPHEN 325 MG PO TABS: 650.0000 mg | ORAL_TABLET | ORAL | Status: DC | PRN

## 2012-12-22 MED ORDER — SODIUM CHLORIDE 0.9 % IV SOLN: 250.0000 mL | INTRAVENOUS | Status: DC | PRN

## 2012-12-22 MED ORDER — CLOPIDOGREL BISULFATE 75 MG PO TABS
75.0000 mg | ORAL_TABLET | Freq: Every day | ORAL | Status: DC
  Administered 2012-12-23: 09:00:00 75 mg via ORAL
  Filled 2012-12-22: qty 1

## 2012-12-22 MED ORDER — ATORVASTATIN CALCIUM 80 MG PO TABS
80.0000 mg | ORAL_TABLET | Freq: Every day | ORAL | Status: DC
  Administered 2012-12-22 – 2012-12-23 (×2): 80 mg via ORAL
  Filled 2012-12-22 (×2): qty 1

## 2012-12-22 MED ORDER — SODIUM CHLORIDE 0.9 % IV SOLN
INTRAVENOUS | Status: AC
  Administered 2012-12-22 (×2): via INTRAVENOUS

## 2012-12-22 MED ORDER — LISINOPRIL 10 MG PO TABS
10.0000 mg | ORAL_TABLET | Freq: Two times a day (BID) | ORAL | Status: DC
  Administered 2012-12-22 – 2012-12-23 (×2): 10 mg via ORAL
  Filled 2012-12-22 (×4): qty 1

## 2012-12-22 MED ORDER — SODIUM CHLORIDE 0.9 % IJ SOLN: 3.0000 mL | Freq: Two times a day (BID) | INTRAMUSCULAR | Status: DC

## 2012-12-22 MED ORDER — ONDANSETRON HCL 4 MG/2ML IJ SOLN: 4.0000 mg | Freq: Four times a day (QID) | INTRAMUSCULAR | Status: DC | PRN

## 2012-12-22 MED ORDER — HEPARIN BOLUS VIA INFUSION
4000.0000 [IU] | Freq: Once | INTRAVENOUS | Status: AC
  Administered 2012-12-22: 4000 [IU] via INTRAVENOUS

## 2012-12-22 MED ORDER — ALPRAZOLAM 0.25 MG PO TABS: 0.2500 mg | ORAL_TABLET | Freq: Two times a day (BID) | ORAL | Status: DC | PRN

## 2012-12-22 MED ORDER — SODIUM CHLORIDE 0.9 % IV SOLN
Freq: Once | INTRAVENOUS | Status: AC
  Administered 2012-12-22: 16:00:00 via INTRAVENOUS

## 2012-12-22 MED ORDER — ENOXAPARIN SODIUM 40 MG/0.4ML ~~LOC~~ SOLN
40.0000 mg | SUBCUTANEOUS | Status: DC
  Filled 2012-12-22 (×2): qty 0.4

## 2012-12-22 MED ORDER — FAMOTIDINE 20 MG PO TABS
20.0000 mg | ORAL_TABLET | Freq: Two times a day (BID) | ORAL | Status: DC | PRN
  Filled 2012-12-22: qty 1

## 2012-12-22 MED ORDER — ISOSORBIDE MONONITRATE ER 30 MG PO TB24
30.0000 mg | ORAL_TABLET | Freq: Every day | ORAL | Status: DC
  Administered 2012-12-23: 09:00:00 30 mg via ORAL
  Filled 2012-12-22 (×2): qty 1

## 2012-12-22 MED ORDER — FENTANYL CITRATE 0.05 MG/ML IJ SOLN
INTRAMUSCULAR | Status: AC
  Filled 2012-12-22: qty 2

## 2012-12-22 MED ORDER — HEPARIN (PORCINE) IN NACL 2-0.9 UNIT/ML-% IJ SOLN
INTRAMUSCULAR | Status: AC
  Filled 2012-12-22: qty 1000

## 2012-12-22 MED ORDER — HEPARIN SODIUM (PORCINE) 1000 UNIT/ML IJ SOLN
INTRAMUSCULAR | Status: AC
  Filled 2012-12-22: qty 1

## 2012-12-22 MED ORDER — DIAZEPAM 5 MG PO TABS: 5.0000 mg | ORAL_TABLET | ORAL | Status: DC

## 2012-12-22 MED ORDER — LIDOCAINE HCL (PF) 1 % IJ SOLN
INTRAMUSCULAR | Status: AC
  Filled 2012-12-22: qty 30

## 2012-12-22 MED ORDER — ASPIRIN 81 MG PO CHEW
324.0000 mg | CHEWABLE_TABLET | ORAL | Status: AC
  Administered 2012-12-22: 243 mg via ORAL
  Filled 2012-12-22: qty 3

## 2012-12-22 MED ORDER — MIDAZOLAM HCL 2 MG/2ML IJ SOLN
INTRAMUSCULAR | Status: AC
  Filled 2012-12-22: qty 2

## 2012-12-22 MED ORDER — HEPARIN (PORCINE) IN NACL 100-0.45 UNIT/ML-% IJ SOLN
1400.0000 [IU]/h | INTRAMUSCULAR | Status: DC
  Administered 2012-12-22: 1400 [IU]/h via INTRAVENOUS
  Filled 2012-12-22: qty 250

## 2012-12-22 MED ORDER — NITROGLYCERIN 1 MG/10 ML FOR IR/CATH LAB
INTRA_ARTERIAL | Status: AC
  Filled 2012-12-22: qty 10

## 2012-12-22 MED ORDER — NITROGLYCERIN 0.4 MG SL SUBL: 0.4000 mg | SUBLINGUAL_TABLET | SUBLINGUAL | Status: DC | PRN

## 2012-12-22 MED ORDER — NITROGLYCERIN 2 % TD OINT
0.5000 [in_us] | TOPICAL_OINTMENT | Freq: Four times a day (QID) | TRANSDERMAL | Status: DC
  Administered 2012-12-22: 0.5 [in_us] via TOPICAL
  Filled 2012-12-22: qty 1

## 2012-12-22 MED ORDER — SODIUM CHLORIDE 0.9 % IJ SOLN: 3.0000 mL | INTRAMUSCULAR | Status: DC | PRN

## 2012-12-22 MED ORDER — INSULIN GLARGINE 100 UNIT/ML ~~LOC~~ SOLN
18.0000 [IU] | Freq: Every day | SUBCUTANEOUS | Status: DC
  Administered 2012-12-22: 22:00:00 18 [IU] via SUBCUTANEOUS
  Filled 2012-12-22 (×2): qty 0.18

## 2012-12-22 MED ORDER — SPIRONOLACTONE 12.5 MG HALF TABLET
12.5000 mg | ORAL_TABLET | Freq: Every day | ORAL | Status: DC
  Administered 2012-12-23: 09:00:00 12.5 mg via ORAL
  Filled 2012-12-22 (×2): qty 1

## 2012-12-22 MED ORDER — VERAPAMIL HCL 2.5 MG/ML IV SOLN
INTRAVENOUS | Status: AC
  Filled 2012-12-22: qty 2

## 2012-12-22 MED ORDER — ZOLPIDEM TARTRATE 5 MG PO TABS: 5.0000 mg | ORAL_TABLET | Freq: Every evening | ORAL | Status: DC | PRN

## 2012-12-22 MED ORDER — ADULT MULTIVITAMIN W/MINERALS CH
1.0000 | ORAL_TABLET | Freq: Every day | ORAL | Status: DC
  Administered 2012-12-23: 09:00:00 1 via ORAL
  Filled 2012-12-22 (×2): qty 1

## 2012-12-22 MED ORDER — HEPARIN (PORCINE) IN NACL 2-0.9 UNIT/ML-% IJ SOLN
INTRAMUSCULAR | Status: AC
  Filled 2012-12-22: qty 500

## 2012-12-22 MED ORDER — SODIUM CHLORIDE 0.9 % IV SOLN: INTRAVENOUS | Status: DC

## 2012-12-22 MED ORDER — ASPIRIN EC 81 MG PO TBEC
81.0000 mg | DELAYED_RELEASE_TABLET | Freq: Every day | ORAL | Status: DC
  Administered 2012-12-23: 09:00:00 81 mg via ORAL
  Filled 2012-12-22 (×2): qty 1

## 2012-12-22 MED ORDER — CARVEDILOL 12.5 MG PO TABS
12.5000 mg | ORAL_TABLET | Freq: Two times a day (BID) | ORAL | Status: DC
  Administered 2012-12-23: 12.5 mg via ORAL
  Filled 2012-12-22 (×2): qty 1

## 2012-12-22 NOTE — Telephone Encounter (Signed)
C/o chest pressure that is relieved with rest, last stent 12/15/12. Pressure is mid chest and states is the same as pain prior to last stent. Pain has been off and on since dc from hospital. Pt has taken asa today. Pt was told to go to ED. I will route to Dr Shirlee Latch to update him.

## 2012-12-22 NOTE — ED Provider Notes (Signed)
62 year old male history of coronary disease recently cathed and had obstruction of prior stent, angioplasty to this area but since discharge the patient has had increased dyspnea and pain on exertion requiring rest after about 5 minutes of walking, increased pain today which is a pressure-like feeling in the left upper chest. EKG does not show any acute ischemia, no ST elevations or depressions, nonspecific T waves.  workup with troponin, chest x-ray and suspect the patient will need to be admitted to the hospital, we'll consult cardiology after tests were back.  Medical screening examination/treatment/procedure(s) were conducted as a shared visit with non-physician practitioner(s) and myself.  I personally evaluated the patient during the encounter    Vida Roller, MD 12/22/12 1414

## 2012-12-22 NOTE — Telephone Encounter (Signed)
New problem    Patient at home now .    Wife calling   C/O chest pain .

## 2012-12-22 NOTE — H&P (Signed)
History and Physical   Patient ID: Randy Castro MRN: [id number], DOB/AGE: 10-22-1950 62 y.o. Date of Encounter: 12/22/2012  Primary Physician: Willow Ora, MD Primary Cardiologist: DM  Chief Complaint:  Chest pain  HPI: Randy Castro is a 62 y.o. male with a history of CAD. He was discharged on 12/16/2012 after PTCA with a cutting balloon to the LAD for in-stent restenosis. He had no other critical disease and his EF is preserved. Shortly after being discharged home, he developed recurrent chest pressure. It was a 4 or 5/10. He was getting it consistently with exertion such as walking for 5 minutes per cardiac rehabilitation guidelines. It was relieved with rest and so he did not use nitroglycerin. There were no associated symptoms. Last p.m., after the death of a beloved pet, he had chest pain that started at rest. It was a 5/10. He took a sublingual nitroglycerin and the chest pain resolved. He contacted the office and described his symptoms. He came to the emergency room as requested. In the emergency room, he is currently resting comfortably. The chest pain reminded him of his previous anginal symptoms.    Past Medical History  Diagnosis Date  . Diabetes mellitus dx in the 90s  . Hyperlipidemia   . HTN (hypertension)   . Arthritis   . CAD (coronary artery disease)     NSTEMI 12/12: EF 40-45%. UVO:[id number] balloon PTCA + Promus DES x 1 to mid LAD. 11/2012: Cath with Cutting balloon PTCA  LAD for ISR to LAD, EF 55%  . Ischemic cardiomyopathy     a.  echo 08/06/11: dist ant wall, apical and septal and infero-apical HK, mild LVH, EF 40%, mild LAE, PASP 34, asc aorta mildly dilated, mild TR;  b.  Echo (3/13) showed recovery of LV systolic function with EF 55-60%, grade I diastolic dysfunction, mild MR.     Surgical History:  Past Surgical History  Procedure Laterality Date  . Neck surgery  in the 90s    "disk removal"  . Cardiac catheterization    . Coronary angioplasty       I have  reviewed the patient's current medications. Prior to Admission medications   Medication Sig Start Date End Date Taking? Authorizing Provider  aspirin EC 81 MG tablet Take 81 mg by mouth daily.   Yes Historical Provider, MD  carvedilol (COREG) 12.5 MG tablet Take 12.5 mg by mouth 2 (two) times daily with a meal.   Yes Historical Provider, MD  clopidogrel (PLAVIX) 75 MG tablet Take 75 mg by mouth daily.   Yes Historical Provider, MD  isosorbide mononitrate (IMDUR) 30 MG 24 hr tablet Take 30 mg by mouth daily.   Yes Historical Provider, MD  lisinopril (PRINIVIL,ZESTRIL) 10 MG tablet Take 10 mg by mouth 2 (two) times daily.   Yes Historical Provider, MD  Multiple Vitamin (MULTIVITAMIN WITH MINERALS) TABS Take 1 tablet by mouth daily.   Yes Historical Provider, MD  acetaminophen (TYLENOL) 325 MG tablet Take 650 mg by mouth every 6 (six) hours as needed for pain.    Historical Provider, MD  clotrimazole-betamethasone (LOTRISONE) cream Apply topically 2 (two) times daily as needed. 07/28/12 07/28/13  [name], MD  famotidine (PEPCID) 20 MG tablet Take 1 tablet (20 mg total) by mouth 2 (two) times daily as needed for heartburn. 08/28/11 12/15/12  Beatrice Lecher, PA-C  famotidine (PEPCID) 20 MG tablet Take 1 tablet (20 mg total) by mouth 2 (two) times daily as needed for  heartburn. 12/16/12 04/05/14  Joline Salt Barrett, PA-C  glucose blood (ONE TOUCH TEST STRIPS) test strip Use as instructed 07/02/11 07/01/12  [name], MD  glucose blood test strip Use as instructed 12/20/12   Carlus Pavlov, MD  glucose blood test strip Use as instructed 12/20/12   Carlus Pavlov, MD  isosorbide mononitrate (IMDUR) 30 MG 24 hr tablet Take 1 tablet (30 mg total) by mouth daily. 11/28/12   [name], MD  lisinopril (PRINIVIL,ZESTRIL) 10 MG tablet Take 1 tablet (10 mg total) by mouth 2 (two) times daily. 11/28/12 11/28/13  [name], MD  MAGNESIUM PO Take 1 tablet by mouth daily.    Historical Provider, MD  Multiple Vitamins-Minerals  (MULTIVITAMINS THER. W/MINERALS) TABS Take 1 tablet by mouth daily.      Historical Provider, MD  nitroGLYCERIN (NITROSTAT) 0.4 MG SL tablet Place 1 tablet (0.4 mg total) under the tongue every 5 (five) minutes as needed for chest pain. 12/16/12   Rhonda G Barrett, PA-C  spironolactone (ALDACTONE) 25 MG tablet Take 0.5 tablets (12.5 mg total) by mouth daily. 08/15/12   Laurey Morale, MD   Scheduled Meds: . nitroGLYCERIN  0.5 inch Topical Q6H   Continuous Infusions: . heparin 1,400 Units/hr (12/22/12 1531)   PRN Meds:.  Allergies: No Known Allergies  History   Social History  . Marital Status: Married    Spouse Name: N/A    Number of Children: 2   . Years of Education: N/A   Occupational History  . maintenance tech    Social History Main Topics  . Smoking status: Former Smoker    Quit date: 04/12/1992  . Smokeless tobacco: Never Used     Comment: >20 years ago  . Alcohol Use: No     Comment: rarely maybe every 3-4 months, beer  . Drug Use: No  . Sexually Active: Yes   Other Topics Concern  . Not on file   Social History Narrative   Moved back to [place] from [facility] 2011    Family History  Problem Relation Age of Onset  . Colon cancer Neg Hx   . Prostate cancer Neg Hx   . Diabetes      M, sister, brothers   . Coronary artery disease      F , MI at age 45   . Stroke Neg Hx    Review of Systems: He has occasional diarrhea which he's had this week. There is no melena or blood noted. He feels the symptoms are related to one of his diabetes medications. He has not had fevers or chills. He has been burping more than usual but this pain is not similar to the chest pain he's been getting with exertion. He has occasional musculoskeletal aches and pains. Full 14-point review of systems otherwise negative except as noted above.  Physical Exam: Blood pressure 131/54, pulse 64, temperature 97.8 F (36.6 C), temperature source Oral, resp. rate 16, SpO2 97.00%. General: Well  developed, well nourished,male in no acute distress. Head: Normocephalic, atraumatic, sclera non-icteric, no xanthomas, nares are without discharge. Dentition: Good Neck: No carotid bruits. JVD not elevated. No thyromegally Lungs: Good expansion bilaterally. without wheezes or rhonchi.  Heart: Regular rate and rhythm with S1 S2.  No S3 or S4. Soft systolic murmur, no rubs, or gallops appreciated. Abdomen: Soft, non-tender, non-distended with normoactive bowel sounds. No hepatomegaly. No rebound/guarding. No obvious abdominal masses. Msk:  Strength and tone appear normal for age. No joint deformities or  effusions, no spine or costo-vertebral angle tenderness. Extremities: No clubbing or cyanosis. No edema.  Distal pedal pulses are 2+ in 4 extrem; cath site at the right radial artery is without bruit or hematoma Neuro: Alert and oriented X 3. Moves all extremities spontaneously. No focal deficits noted. Psych:  Responds to questions appropriately with a normal affect. Skin: No rashes or lesions noted  Labs:   Lab Results  Component Value Date   WBC 6.5 12/22/2012   HGB 12.7* 12/22/2012   HCT 36.1* 12/22/2012   MCV 81.5 12/22/2012   PLT 227 12/22/2012   No results found for this basename: INR,  in the last 72 hours  Recent Labs Lab 12/15/12 1644  12/22/12 1423  NA 142  < > 140  K 4.0  < > 4.1  CL 105  < > 103  CO2 27  < > 28  BUN 10  < > 11  CREATININE 0.70  < > 0.65  CALCIUM 9.5  < > 9.4  PROT 6.8  --   --   BILITOT 0.4  --   --   ALKPHOS 76  --   --   ALT 35  --   --   AST 27  --   --   GLUCOSE 86  < > 230*  < > = values in this interval not displayed.  Recent Labs  12/22/12 1420  TROPONINI <0.30   Radiology/Studies: Dg Chest Portable 1 View 12/22/2012  *RADIOLOGY REPORT*  Clinical Data: Chest pain  PORTABLE CHEST - 1 VIEW  Comparison: 12/15/12  Findings: Cardiomediastinal silhouette is stable.  No acute infiltrate or pleural effusion.  No pulmonary edema.  Bony thorax is stable.   IMPRESSION: No active disease.   Original Report Authenticated By: Natasha Mead, M.D.     Cardiac Cath:12/22/2012 Left mainstem: Widely patent without significant stenosis  Left anterior descending (LAD): There is a stented segment in the proximal LAD with 90% eccentric focal stenosis seen best in the RAO caudal view. The first diagonal arising from the stented segment is patent with 50-60% ostial stenosis. Remaining portions of the mid and distal LAD are widely patent with minor irregularity.  Left circumflex (LCx): The left circumflex is patent. The obtuse marginal branches are patent. The vessel is dominant and gives off a PDA and 2 posterolateral branches without significant stenoses. There are minor irregularities throughout.  Right coronary artery (RCA): The RCA was nonselectively injected. The vessel is small and nondominant without significant stenosis.  Left ventriculography: Left ventricular systolic function is normal, LVEF is estimated at 55-65%, there is no significant mitral regurgitation  PCI Note: Following the diagnostic procedure, the decision was made to proceed with PCI. Weight-based heparin was given for anticoagulation. Once a therapeutic ACT was achieved, a 6 [place] XB-LAD guide catheter was inserted. A [place] coronary guidewire was used to cross the lesion. The prowater wire was used to seat the guide in the left main. The lesion was within a previously placed stent so I elected to treat it with cutting balloon angioplasty. The lesion was predilated with a 3.0x10 mm cutting balloon. There was residual stenosis noted within the stented segment. The lesion was then dilated with a 3.5 x 10 mm noncompliant balloon to 20 atmospheres. Following PCI, there was 0% residual stenosis and TIMI-3 flow. Final angiography confirmed an excellent result in the main and LAD branch. The diagonal branch arising from the stented segment showed eccentric 70% ostial stenosis unchanged from the old study.  The patient tolerated the procedure well. There were no immediate procedural complications. A TR band was used for radial hemostasis. The patient was transferred to the post catheterization recovery area for further monitoring.  PCI Data:  Vessel - LAD/Segment - proximal, in-stent restenosis  Percent Stenosis (pre) 90  TIMI-flow 3  Stent none, treated with a 3.5 x 10 mm noncompliant balloon  Percent Stenosis (post) 0  TIMI-flow (post) 3  Final Conclusions:  1. Severe in-stent restenosis in the proximal LAD, treated successfully with cutting balloon angioplasty followed by noncompliant balloon angioplasty  2. Wide patency of the left circumflex which is a dominant vessel  3. No significant stenosis of the small nondominant RCA  4. Normal LV systolic function  Recommendations:  Continue dual antiplatelet therapy with aspirin and Plavix.   ECG: 22-Dec-2012 13:49:42 [facility]/ED ROUTINE RECORD SINUS RHYTHM ~ normal P axis, V-rate 50- 99 LEFT VENTRICULAR HYPERTROPHY ~ (SV1+RV5)>3.5/(RaVL+SV3)>2.80 CONSIDER ANTERIOR INFARCT ~ diminished R <0.110mV V3 Standard 12 Lead Report ~ Unconfirmed Interpretation Abnormal ECG 24mm/s 60mm/mV 150Hz  8.0.1 12SL 235 CID: [id number] Referred by: Unconfirmed Vent. rate 66 BPM PR interval 188 ms QRS duration 102 ms QT/QTc 396/415 ms P-R-T axes 20 -27 56  ASSESSMENT AND PLAN:  Principal Problem:   Intermediate coronary syndrome - he is having recurrent anginal symptoms that started shortly after his last PCI. His initial cardiac enzymes are negative and his ECG is not acute. He will likely need repeat cardiac catheterization, unclear if this can be done today or tomorrow. Continue current therapy and add heparin; will add nitrates if he gets recurrent pain.  Otherwise, continue current medications Active Problems:   DM (diabetes mellitus) with complications   Hyperlipidemia   HTN (hypertension)  Signed, Theodore Demark,  PA-C 12/22/2012  Patient seen with PA, agree with the above note.  Recent cutting balloon angioplasty to in-stent restenosis of LAD.  Since discharge from the hospital, he has continued to have exertional chest pain resolved by rest.  Last night, he had chest pain at rest associated with emotion stress (dog died).  This was resolved with NTG sublingual.  He has had on and off exertional chest pain today.  I am concerned for a problem at the angioplasty site in the LAD.  He has been taking his Plavix and P2Y12 test was adequate during last admission. I will send him for re-look cath this afternoon.   [name] 12/22/2012 3:37 PM

## 2012-12-22 NOTE — Interval H&P Note (Signed)
History and Physical Interval Note:  12/22/2012 5:01 PM  Randy Castro  has presented today for surgery, with the diagnosis of Chest pain  The various methods of treatment have been discussed with the patient and family. After consideration of risks, benefits and other options for treatment, the patient has consented to  Procedure(s): LEFT HEART CATHETERIZATION WITH CORONARY ANGIOGRAM (N/A) as a surgical intervention .  The patient's history has been reviewed, patient examined, no change in status, stable for surgery.  I have reviewed the patient's chart and labs.  Questions were answered to the patient's satisfaction.     Tonny Bollman

## 2012-12-22 NOTE — Progress Notes (Signed)
ANTICOAGULATION CONSULT NOTE - Initial Consult  Pharmacy Consult for heparin Indication: chest pain/ACS  No Known Allergies  Patient Measurements:   Heparin Dosing Weight: 105kg  Vital Signs: Temp: 97.8 F (36.6 C) (05/01 1351) Temp src: Oral (05/01 1351) BP: 137/63 mmHg (05/01 1351) Pulse Rate: 66 (05/01 1351)  Labs: No results found for this basename: HGB, HCT, PLT, APTT, LABPROT, INR, HEPARINUNFRC, CREATININE, CKTOTAL, CKMB, TROPONINI,  in the last 72 hours  The CrCl is unknown because both a height and weight (above a minimum accepted value) are required for this calculation.   Medical History: Past Medical History  Diagnosis Date  . Diabetes mellitus dx in the 90s  . Hyperlipidemia   . HTN (hypertension)   . Arthritis   . CAD (coronary artery disease)     NSTEMI 12/12: LHC 08/06/11: mLAD 99%, ant HK with 40-45%.  PCI:  Cutting balloon PTCA + Promus DES x 1 to mid LAD.  DAPT recommended x 1 year  . Ischemic cardiomyopathy     a.  echo 08/06/11: dist ant wall, apical and septal and infero-apical HK, mild LVH, EF 40%, mild LAE, PASP 34, asc aorta mildly dilated, mild TR;  b.  Echo (3/13) showed recovery of LV systolic function with EF 55-60%, grade I diastolic dysfunction, mild MR.     Medications:  Infusions:  . sodium chloride    . heparin    . heparin      Assessment: 89 yom s/p cardiac cath on 4/24 which demonstrated stent restenosis presented with exertional CP and SOB. No labs completed yet this admission but as of 4/25 H/H 12/34.5, plts 206, INR 0.96. CBC has been ordered. He was discharged on dual antiplatelet therapy with ASA + plavix but no other anticoagulants. To start IV heparin.   Goal of Therapy:  Heparin level 0.3-0.7 units/ml Monitor platelets by anticoagulation protocol: Yes   Plan:  1. Heparin bolus 4000 units IV x 1 2. Heparin gtt 1400 units/hr 3. Check a 6 hour heparin level 4. Daily heparin level and CBC  Auston Halfmann, Drake Leach 12/22/2012,2:38 PM

## 2012-12-22 NOTE — ED Notes (Signed)
Last Thursday pt. Had a heart cath which had a balloon placed due to 90% blocked Since the procedure pt. Having  Chest pressure radiates to the lt. Arm intermittently.  Feel sob and dizziness also.  Pt. Rates pain a 1/10, denies any n/v  Skin is p/w/d. Pt. Reports that the pain last night  Became worse .

## 2012-12-22 NOTE — CV Procedure (Signed)
   Cardiac Catheterization Procedure Note  Name: MONNIE ANSPACH MRN: [id number] DOB: [birth year]  Procedure: Left Heart Cath, Selective Coronary Angiography, LV angiography, PTCA and stenting of the first diagonal  Indication: Unstable angina. 62 year old gentleman who recently presented with unstable angina and underwent cutting balloon angioplasty of severe in-stent restenosis in the LAD. He was noted to have a large diagonal branch that was jailed by the stent. Because this was angiographically unchanged from previous he was treated medically. Since discharge from the hospital last week he has experienced continued exertional angina at low level activity (class III). Last night he had rest symptoms and he returns today for further evaluation in the emergency department. Because of typical anginal symptoms with a crescendo pattern he was referred back directly for cardiac catheterization.  Procedural Details:  The right wrist was prepped, draped, and anesthetized with 1% lidocaine. Using the modified Seldinger technique, a 5/6 French sheath was introduced into the right radial artery. 3 mg of verapamil was administered through the sheath, weight-based unfractionated heparin was administered intravenously. I did not image the right coronary artery as it is known to be nondominant and small. Left ventriculography was also deferred as this was just done last week. The left main was engaged with an XB LAD 3.5 cm guide catheter. Multiple other catheters were attempted without success.  PROCEDURAL FINDINGS Hemodynamics: AO 127/67   Coronary angiography: Coronary dominance: right  Left mainstem: Widely patent with no obstructive disease.  Left anterior descending (LAD): The LAD is widely patent throughout. The previously treated stent in the proximal LAD is widely patent with no significant in-stent restenosis. There is a large bifurcating diagonal arising from the stented segment with 80-90% ostial  stenosis.  Left circumflex (LCx): The left circumflex is patent. The obtuse marginals and left posterolateral branch as well as left PDA are all widely patent. There is diffuse nonobstructive disease present.  Right coronary artery (RCA): Not selectively imaged  Left ventriculography: Deferred  PCI Note:  Following the diagnostic procedure, the decision was made to proceed with PCI. Heparin was used for anticoagulation. A total of 10,000 units was given.  The patient has been on long-term Plavix and his P2 Y. 12 was recently checked and found to be therapeutic.  A pro-water coronary guidewire was used to cross the lesion.  The lesion was predilated with a 2.0 x 12 mm balloon.  The lesion was then treated with a 2.5 x 10 mm angioscoped balloon to 12 atmospheres. A total of 2 inflations was done. There was 30% residual stenosis. I felt this was the best result we could obtain and there was TIMI-3 flow present.  Final angiography confirmed an excellent result. The patient tolerated the procedure well. There were no immediate procedural complications. A TR band was used for radial hemostasis. The patient was transferred to the post catheterization recovery area for further monitoring.  PCI Data: Vessel - first diagonal/Segment - ostial Percent Stenosis (pre)  80 TIMI-flow 3 Stent none Percent Stenosis (post) 30 TIMI-flow (post) 3  Final Conclusions:   1. Single-vessel coronary artery disease with continued patency of the stented segment in the proximal LAD but severe ostial stenosis of the first diagonal  2. Minor nonobstructive left circumflex stenosis 3. Successful balloon angioplasty of the first diagonal branch  Recommendations:  Continued medical therapy with dual antiplatelet therapy for aspirin and Plavix.  Tonny Bollman 12/22/2012, 6:07 PM

## 2012-12-22 NOTE — Telephone Encounter (Signed)
Agree 

## 2012-12-22 NOTE — ED Provider Notes (Signed)
History     CSN: [id number]  Arrival date & time 12/22/12  1340   First MD Initiated Contact with Patient 12/22/12 1347      Chief Complaint  Patient presents with  . Chest Pain    (Consider location/radiation/quality/duration/timing/severity/associated sxs/prior treatment) Patient is a 62 y.o. male presenting with chest pain. The history is provided by the patient.  Chest Pain Pain location:  L chest Pain quality: aching and dull   Pain radiates to:  Does not radiate Pain radiates to the back: no   Pain severity:  Moderate Associated symptoms: no abdominal pain, no fever, no headache, no nausea, no shortness of breath and no weakness   Associated symptoms comment:  He reports exertional chest pain associated with SOB for several days, worse last night and today. He reports he took one NTG last night with relief but took nothing today. He underwent cardiac catheterization last week by Dr. Excell Seltzer and reports a 90% in stent restenosis that was treated with balloon angioplasty. He had exertional pain prior to the catheterization and the progressively worsening exertional pain after the procedure. No nausea, diaphoresis.    Past Medical History  Diagnosis Date  . Diabetes mellitus dx in the 90s  . Hyperlipidemia   . HTN (hypertension)   . Arthritis   . CAD (coronary artery disease)     NSTEMI 12/12: LHC 08/06/11: mLAD 99%, ant HK with 40-45%.  PCI:  Cutting balloon PTCA + Promus DES x 1 to mid LAD.  DAPT recommended x 1 year  . Ischemic cardiomyopathy     a.  echo 08/06/11: dist ant wall, apical and septal and infero-apical HK, mild LVH, EF 40%, mild LAE, PASP 34, asc aorta mildly dilated, mild TR;  b.  Echo (3/13) showed recovery of LV systolic function with EF 55-60%, grade I diastolic dysfunction, mild MR.     Past Surgical History  Procedure Laterality Date  . Neck surgery  in the 90s    "disk removal"  . Cardiac catheterization    . Coronary angioplasty      Family  History  Problem Relation Age of Onset  . Colon cancer Neg Hx   . Prostate cancer Neg Hx   . Diabetes      M, sister, brothers   . Coronary artery disease      F , MI at age 81   . Stroke Neg Hx     History  Substance Use Topics  . Smoking status: Former Smoker    Quit date: 04/12/1992  . Smokeless tobacco: Never Used     Comment: >20 years ago  . Alcohol Use: No     Comment: rarely maybe every 3-4 months, beer      Review of Systems  Constitutional: Negative for fever.  HENT: Negative for neck pain.   Respiratory: Negative for shortness of breath.   Cardiovascular: Positive for chest pain. Negative for leg swelling.  Gastrointestinal: Negative for nausea and abdominal pain.  Genitourinary: Negative for dysuria.  Musculoskeletal: Negative for myalgias.  Neurological: Negative for weakness and headaches.  Hematological: Does not bruise/bleed easily.  Psychiatric/Behavioral: Negative for confusion.    Allergies  Review of patient's allergies indicates no known allergies.  Home Medications   Current Outpatient Rx  Name  Route  Sig  Dispense  Refill  . acetaminophen (TYLENOL) 325 MG tablet   Oral   Take 650 mg by mouth every 6 (six) hours as needed for pain.         Marland Kitchen  aspirin 81 MG tablet   Oral   Take 81 mg by mouth daily.         Marland Kitchen atorvastatin (LIPITOR) 80 MG tablet   Oral   Take 1 tablet (80 mg total) by mouth daily.   90 tablet   3   . carvedilol (COREG) 12.5 MG tablet   Oral   Take 1 tablet (12.5 mg total) by mouth 2 (two) times daily.   180 tablet   1   . clopidogrel (PLAVIX) 75 MG tablet   Oral   Take 1 tablet (75 mg total) by mouth daily.   90 tablet   3   . clotrimazole-betamethasone (LOTRISONE) cream   Topical   Apply topically 2 (two) times daily as needed.   45 g   2   . EXPIRED: famotidine (PEPCID) 20 MG tablet   Oral   Take 1 tablet (20 mg total) by mouth 2 (two) times daily as needed for heartburn.   180 tablet   3   .  famotidine (PEPCID) 20 MG tablet   Oral   Take 1 tablet (20 mg total) by mouth 2 (two) times daily as needed for heartburn.   180 tablet   3   . EXPIRED: glucose blood (ONE TOUCH TEST STRIPS) test strip      Use as instructed   100 each   3     Dx:250.00   . glucose blood test strip      Use as instructed   300 each   12     Please ignore the previous order for 100 test stri ...   . glucose blood test strip      Use as instructed   100 each   12   . insulin glargine (LANTUS) 100 UNIT/ML injection   Subcutaneous   Inject 0.18 mLs (18 Units total) into the skin at bedtime.   5 pen   PRN   . isosorbide mononitrate (IMDUR) 30 MG 24 hr tablet   Oral   Take 1 tablet (30 mg total) by mouth daily.   30 tablet   6   . lisinopril (PRINIVIL,ZESTRIL) 10 MG tablet   Oral   Take 1 tablet (10 mg total) by mouth 2 (two) times daily.   180 tablet   3   . MAGNESIUM PO   Oral   Take 1 tablet by mouth daily.         . Multiple Vitamins-Minerals (MULTIVITAMINS THER. W/MINERALS) TABS   Oral   Take 1 tablet by mouth daily.           . nitroGLYCERIN (NITROSTAT) 0.4 MG SL tablet   Sublingual   Place 1 tablet (0.4 mg total) under the tongue every 5 (five) minutes as needed for chest pain.   25 tablet   3   . SitaGLIPtin-MetFORMIN HCl (JANUMET XR) 50-1000 MG TB24   Oral   Take 1 tablet by mouth 2 (two) times daily. HOLD for 48 hours. Resume on 12/18/2012.   180 tablet   0   . spironolactone (ALDACTONE) 25 MG tablet   Oral   Take 0.5 tablets (12.5 mg total) by mouth daily.   45 tablet   3     BP 137/63  Pulse 66  Temp(Src) 97.8 F (36.6 C) (Oral)  Resp 18  SpO2 97%  Physical Exam  Constitutional: He is oriented to person, place, and time. He appears well-developed and well-nourished.  HENT:  Head: Normocephalic.  Neck:  Normal range of motion. Neck supple.  Cardiovascular: Normal rate and regular rhythm.   Pulmonary/Chest: Effort normal and breath sounds  normal.  Abdominal: Soft. Bowel sounds are normal. There is no tenderness. There is no rebound and no guarding.  Musculoskeletal: Normal range of motion. He exhibits no edema.  Neurological: He is alert and oriented to person, place, and time. A cranial nerve deficit is present.  Skin: Skin is warm and dry. No rash noted.  Psychiatric: He has a normal mood and affect.    ED Course  Procedures (including critical care time)  Labs Reviewed - No data to display No results found.   No diagnosis found.  1. Exertional chest pain 2. Unstable angina   MDM  Records reviewed. Patient underwent catheterization on 12/15/12 by Dr. Excell Seltzer, Continues to have exertional chest pain. Discussed with [facility] with labs pending. EKG non-acute. Patient comfortable at this time. Plan to admit to cardiology.        Arnoldo Hooker, PA-C 12/22/12 1434

## 2012-12-23 ENCOUNTER — Encounter (HOSPITAL_COMMUNITY): Payer: Self-pay | Admitting: Nurse Practitioner

## 2012-12-23 LAB — COMPREHENSIVE METABOLIC PANEL
Albumin: 3.7 g/dL (ref 3.5–5.2)
BUN: 8 mg/dL (ref 6–23)
CO2: 29 mEq/L (ref 19–32)
Calcium: 9 mg/dL (ref 8.4–10.5)
Chloride: 103 mEq/L (ref 96–112)
Creatinine, Ser: 0.68 mg/dL (ref 0.50–1.35)
GFR calc non Af Amer: >90 mL/min (ref >90)
Total Bilirubin: 0.4 mg/dL (ref 0.3–1.2)

## 2012-12-23 LAB — CBC
Hemoglobin: 13.1 g/dL (ref 13.0–17.0)
MCHC: 34.1 g/dL (ref 30.0–36.0)
RBC: 4.62 MIL/uL (ref 4.22–5.81)

## 2012-12-23 LAB — TROPONIN I
Troponin I: <0.3 ng/mL (ref <0.30)
Troponin I: <0.3 ng/mL (ref <0.30)

## 2012-12-23 MED ORDER — SITAGLIP PHOS-METFORMIN HCL ER 50-1000 MG PO TB24: 1.0000 | ORAL_TABLET | Freq: Two times a day (BID) | ORAL | Status: DC

## 2012-12-23 NOTE — Discharge Summary (Signed)
Patient ID: Randy Castro,  MRN: [id number], DOB/AGE: 1951/07/02 62 y.o.  Admit date: 12/22/2012 Discharge date: 12/23/2012  Primary Care Provider: Willow Ora Primary Cardiologist: Golden Circle, MD  Discharge Diagnoses Principal Problem:   Unstable angina Active Problems:   Coronary Artery Disease   DM (diabetes mellitus) with complications   Hyperlipidemia   HTN (hypertension)  Allergies No Known Allergies  Procedures  Cardiac Catheterization and Percutaneous Coronary Intervention 12/22/2012  PROCEDURAL FINDINGS Hemodynamics: AO 127/67              Coronary angiography: Coronary dominance: right  Left mainstem: Widely patent with no obstructive disease. Left anterior descending (LAD): The LAD is widely patent throughout. The previously treated stent in the proximal LAD is widely patent with no significant in-stent restenosis. There is a large bifurcating diagonal arising from the stented segment with 80-90% ostial stenosis.   **The Diagonal was successfully treated with balloon angioplasty.**  Left circumflex (LCx): The left circumflex is patent. The obtuse marginals and left posterolateral branch as well as left PDA are all widely patent. There is diffuse nonobstructive disease present. Right coronary artery (RCA): Not selectively imaged Left ventriculography: Deferred _____________  History of Present Illness  62 y/o male with h/o CAD s/p recent admission in late April secondary to unstable angina.  During that admission, he was found to have significant in-stent restenosis within a previously placed LAD stent and this was successfully treated with cutting balloon angioplasty.  Following discharge, he developed recurrent chest pressure with exertion, which resolved with rest.  On the evening prior to admission, following the death of a beloved pet, he had recurrent nitrate-responsive chest pain.  On 5/1, he contacted our office and was advised to present to the ED for evaluation.   There, he was pain free and ECG was non-acute.  He was admitted for further evaluation.  Hospital Course  Pt r/o for MI.  He underwent diagnostic catheterization on 5/1 revealing patency of the recently treated LAD area with an 80% stenosis in a diagonal branch arising from the stented segment in the LAD.  This was felt to be the culprit for his symptoms and was successfully treated with balloon angioplasty.  He tolerated this procedure well and post-procedure has been ambulating without recurrent symptoms or limitations.  He will be discharged home today in good condition.  Discharge Vitals Blood pressure 155/63, pulse 63, temperature 97.5 F (36.4 C), temperature source Oral, resp. rate 16, weight 261 lb 14.5 oz (118.8 kg), SpO2 96.00%.  Filed Weights   12/23/12 0014  Weight: 261 lb 14.5 oz (118.8 kg)   Labs  CBC  Recent Labs  12/22/12 1423 12/22/12 1846 12/23/12 0700  WBC 6.5 7.7 7.5  NEUTROABS 3.8  --   --   HGB 12.7* 12.7* 13.1  HCT 36.1* 36.2* 38.4*  MCV 81.5 81.9 83.1  PLT 227 228 212   Basic Metabolic Panel  Recent Labs  12/22/12 1423 12/22/12 1846 12/23/12 0700  NA 140  --  140  K 4.1  --  4.1  CL 103  --  103  CO2 28  --  29  GLUCOSE 230*  --  180*  BUN 11  --  8  CREATININE 0.65 0.63 0.68  CALCIUM 9.4  --  9.0   Liver Function Tests  Recent Labs  12/23/12 0700  AST 22  ALT 28  ALKPHOS 76  BILITOT 0.4  PROT 6.7  ALBUMIN 3.7   Cardiac Enzymes  Recent Labs  12/22/12 1912 12/23/12 0107 12/23/12 0700  TROPONINI <0.30 <0.30 <0.30   Disposition  Pt is being discharged home today in good condition.  Follow-up Plans & Appointments  Follow-up Information   Follow up with Tereso Newcomer, PA-C On 01/09/2013. (11:50 AM)    Contact information:   [address] [place] [address] [phone]       Follow up with Carlus Pavlov, MD On 03/21/2013. (8:00 AM)    Contact information:   [address] [place] [address] [phone]       Follow up with Willow Ora, MD On 05/31/2013. (8:30 AM)    Contact information:   [phone] [address] [facility] [address] [place] [address] [phone]     Discharge Medications    Medication List    TAKE these medications       acetaminophen 325 MG tablet  Commonly known as:  TYLENOL  Take 650 mg by mouth every 6 (six) hours as needed for pain.     aspirin EC 81 MG tablet  Take 81 mg by mouth daily.     atorvastatin 80 MG tablet  Commonly known as:  LIPITOR  Take 80 mg by mouth daily.     carvedilol 12.5 MG tablet  Commonly known as:  COREG  Take 12.5 mg by mouth 2 (two) times daily with a meal.     clopidogrel 75 MG tablet  Commonly known as:  PLAVIX  Take 75 mg by mouth daily.     clotrimazole-betamethasone cream  Commonly known as:  LOTRISONE  Apply 1 application topically 2 (two) times daily.     famotidine 20 MG tablet  Commonly known as:  PEPCID  Take 20 mg by mouth 2 (two) times daily as needed for heartburn.     glucose blood test strip  Use as instructed     insulin glargine 100 UNIT/ML injection  Commonly known as:  LANTUS  Inject 18 Units into the skin at bedtime.     isosorbide mononitrate 30 MG 24 hr tablet  Commonly known as:  IMDUR  Take 30 mg by mouth daily.     lisinopril 10 MG tablet  Commonly known as:  PRINIVIL,ZESTRIL  Take 10 mg by mouth 2 (two) times daily.     MAGNESIUM PO  Take 1 tablet by mouth daily.     multivitamin with minerals Tabs  Take 1 tablet by mouth daily.     nitroGLYCERIN 0.4 MG SL tablet  Commonly known as:  NITROSTAT  Place 0.4 mg under the tongue every 5 (five) minutes as needed for chest pain.     SitaGLIPtin-MetFORMIN HCl 50-1000 MG Tb24  Commonly known as:  JANUMET XR  Take 1 tablet by mouth 2 (two) times daily.     spironolactone 25 MG tablet  Commonly known as:  ALDACTONE  Take 12.5 mg by mouth daily.      Outstanding  Labs/Studies  None  Duration of Discharge Encounter   Greater than 30 minutes including physician time.  Signed, Nicolasa Ducking NP 12/23/2012, 9:41 AM

## 2012-12-23 NOTE — ED Provider Notes (Signed)
  Medical screening examination/treatment/procedure(s) were conducted as a shared visit with non-physician practitioner(s) and myself.  I personally evaluated the patient during the encounter  Please see my separate respective documentation pertaining to this patient encounter   Vida Roller, MD 12/23/12 [phone]

## 2012-12-23 NOTE — Progress Notes (Signed)
CARDIAC REHAB PHASE I   PRE:  Rate/Rhythm: 68 SR  BP:  Supine: 153/65  Sitting:   Standing:    SaO2:   MODE:  Ambulation: 1000 ft   POST:  Rate/Rhythm: 81 SR  BP:  Supine:   Sitting: 168/74  Standing:    SaO2:  1610-9604 Pt tolerated ambulation well without c/o of cp or SOB. VS stable. Reviewed discharge education with pt. He voices understanding. He continues to decline Outpt. CRP due to his work hours.  Melina Copa RN 12/23/2012 8:49 AM

## 2012-12-23 NOTE — Progress Notes (Signed)
TR BAND REMOVAL  LOCATION:    right radial  DEFLATED PER PROTOCOL:    yes  TIME BAND OFF / DRESSING APPLIED:  2200  SITE UPON ARRIVAL:    Level 0  SITE AFTER BAND REMOVAL:    Level 0  REVERSE ALLEN'S TEST:     Positive per pleth rt thumb  CIRCULATION SENSATION AND MOVEMENT:    Within Normal Limits   yes  COMMENTS:   See frequent vs.  Written and verbal instructions given re: post cath radial care and restrictions.  Pt voiced understanding.

## 2012-12-23 NOTE — Progress Notes (Signed)
    Subjective:  Feels good. No CP or dyspnea.  Objective:  Vital Signs in the last 24 hours: Temp:  [97.5 F (36.4 C)-97.9 F (36.6 C)] 97.5 F (36.4 C) (05/02 0806) Pulse Rate:  [56-67] 63 (05/02 0806) Resp:  [14-18] 16 (05/01 1934) BP: (126-155)/(50-63) 155/63 mmHg (05/02 0806) SpO2:  [96 %-98 %] 96 % (05/02 0806) Weight:  [118.8 kg (261 lb 14.5 oz)] 118.8 kg (261 lb 14.5 oz) (05/02 0014)  Intake/Output from previous day: 05/01 0701 - 05/02 0700 In: 390 [P.O.:240; I.V.:150] Out: -   Physical Exam: Pt is alert and oriented, NAD HEENT: normal Neck: JVP - normal Lungs: CTA bilaterally CV: RRR without murmur or gallop Abd: soft, NT, Positive BS Ext: no C/C/E, distal pulses intact and equal, right radial site clear Skin: warm/dry no rash   Lab Results:  Recent Labs  12/22/12 1846 12/23/12 0700  WBC 7.7 7.5  HGB 12.7* 13.1  PLT 228 212    Recent Labs  12/22/12 1423 12/22/12 1846 12/23/12 0700  NA 140  --  140  K 4.1  --  4.1  CL 103  --  103  CO2 28  --  29  GLUCOSE 230*  --  180*  BUN 11  --  8  CREATININE 0.65 0.63 0.68    Recent Labs  12/23/12 0107 12/23/12 0700  TROPONINI <0.30 <0.30    Tele: Sinus brady/sinus rhythm  Assessment/Plan:  1. USAP - doing well post-PCI. D/c home today on current med Rx. Med program reviewed. We discussed post-ACS restrictions. Off work next week, return the following Monday.   2. HTN - controlled on current meds (beta-blocker, ACE, aldactone)  Dispo: home today. Follow-up Dr Shirlee Latch or PA 2 weeks.   Tonny Bollman, M.D. 12/23/2012, 8:18 AM

## 2012-12-23 NOTE — Progress Notes (Signed)
Utilization Review Completed Lynze Reddy J. Ellisha Bankson, RN, BSN, NCM [phone]  

## 2012-12-26 ENCOUNTER — Encounter: Payer: Self-pay | Admitting: *Deleted

## 2012-12-26 ENCOUNTER — Telehealth: Payer: Self-pay | Admitting: *Deleted

## 2012-12-26 ENCOUNTER — Encounter: Payer: BC Managed Care – PPO | Admitting: Physician Assistant

## 2012-12-26 NOTE — Telephone Encounter (Signed)
TCM phone call. Patient contacted regarding discharge from [facility]. Patient understands to follow up with Tereso Newcomer PA. Patient understands discharge instructions. Patient understands medications and regiment. Patient understands to bring all medications to this visit.  Patient feels well without any chest pain. He would like to return to work on 5/12 even though his post hospital visit is not until 5/19. He works in Production designer, theatre/television/film in [employer] but can hold off on heavy lifting next week. Will fax note to HR contact Lindsay at [phone].

## 2013-01-09 ENCOUNTER — Encounter: Payer: Self-pay | Admitting: Physician Assistant

## 2013-01-09 ENCOUNTER — Encounter: Payer: Self-pay | Admitting: *Deleted

## 2013-01-09 ENCOUNTER — Ambulatory Visit (INDEPENDENT_AMBULATORY_CARE_PROVIDER_SITE_OTHER): Payer: BC Managed Care – PPO | Admitting: Physician Assistant

## 2013-01-09 VITALS — BP 144/78 | HR 67 | Ht 73.0 in | Wt 260.8 lb

## 2013-01-09 DIAGNOSIS — I10 Essential (primary) hypertension: Secondary | ICD-10-CM

## 2013-01-09 DIAGNOSIS — E785 Hyperlipidemia, unspecified: Secondary | ICD-10-CM

## 2013-01-09 DIAGNOSIS — I2510 Atherosclerotic heart disease of native coronary artery without angina pectoris: Secondary | ICD-10-CM

## 2013-01-09 NOTE — Patient Instructions (Addendum)
Your physician recommends that you continue on your current medications as directed. Please refer to the Current Medication list given to you today.  Your physician recommends that you keep your appointment with Dr. Shirlee Latch on Wednesday, 8/20 @ 9:15 am  Our office wrote you a work note today with no restrictions

## 2013-01-09 NOTE — Progress Notes (Signed)
[address] [place], [place]  [address] Phone: [phone] Fax:  [phone]  Date:  01/09/2013   ID:  Randy Castro, DOB 11/24/1950, MRN [id number]  PCP:  Willow Ora, MD  Primary Cardiologist:  Dr. [name]     History of Present Illness: Randy Castro is a 62 y.o. male who returns for f/u after a recent admission to the hospital for unstable angina.  He has a hx of CAD, DM2, HTN. He had a NSTEMI in 12/12. He had DES to mid LAD. EF was 40% with apical hypokinesis at the time of the MI. Repeat echo in 3/13 showed recovery of LV systolic function, EF 55-60%. I saw him in the office recently with c/o's substernal chest tightness/heaviness over 2-3 weeks. He was admitted from the office. LHC 12/15/12: Proximal LAD 90% ISR, ostial D1 50-60%, EF 55-65%.  PCI:  Cutting Balloon angioplasty of the proximal LAD ISR. Patient was readmitted 5/1-5/2 with recurrent NTG responsive CP.  He ruled out for myocardial infarction. LHC 12/22/12: Proximal LAD stent patent, ostial D1 80-90%. He underwent balloon angioplasty of the diagonal.  Since d/c, he has done well without recurrent CP.  Denies dyspnea, orthopnea, PND, edema, syncope.  He feels much better.  He has been back at work on light duty for a week now.     Labs (1/13): LDL 47, HDL 35, K 3.7, creatinine 1.0  Labs (2/13): K 4.1, creatinine 0.7  Labs (5/13): K 4.5, creatinine 0.8, LDL 40, HDL 34  Labs (4/14): K 3.8, Cr 0.8, Hgb 12.9  Labs (5/14): K 4.1, Cr 0.68, ALT 28, Hgb 13.1  Wt Readings from Last 3 Encounters:  01/09/13 260 lb 12.8 oz (118.298 kg)  12/23/12 258 lb (117.028 kg)  12/23/12 258 lb (117.028 kg)     Past Medical History  Diagnosis Date  . Hyperlipidemia   . HTN (hypertension)   . CAD (coronary artery disease)     a. NSTEMI 12/12: EF 40-45%. XLK:GMWNUUV balloon PTCA + Promus DES x 1 to mid LAD;  b. 11/2012: Cath with Cutting balloon PTCA  LAD for ISR to LAD, EF 55%;  c. 12/2012 Cath/PCI: LAD stents patent, 80  ost Diag (jailed)->PTCA, LCX/RCA patent.  . Ischemic cardiomyopathy     a.  echo 08/06/11: dist ant wall, apical and septal and infero-apical HK, mild LVH, EF 40%, mild LAE, PASP 34, asc aorta mildly dilated, mild TR;  b.  Echo (3/13) showed recovery of LV systolic function with EF 55-60%, grade I diastolic dysfunction, mild MR.   Marland Kitchen Exertional shortness of breath   . Type II diabetes mellitus dx in the 90s  . Arthritis     "knees probably" (12/22/2012)    Current Outpatient Prescriptions  Medication Sig Dispense Refill  . acetaminophen (TYLENOL) 325 MG tablet Take 650 mg by mouth every 6 (six) hours as needed for pain.      Marland Kitchen aspirin EC 81 MG tablet Take 81 mg by mouth daily.      Marland Kitchen atorvastatin (LIPITOR) 80 MG tablet Take 80 mg by mouth daily.      . carvedilol (COREG) 12.5 MG tablet Take 12.5 mg by mouth 2 (two) times daily with a meal.      . clopidogrel (PLAVIX) 75 MG tablet Take 75 mg by mouth daily.      . clotrimazole-betamethasone (LOTRISONE) cream Apply 1 application topically 2 (two) times daily.      . famotidine (PEPCID) 20 MG  tablet Take 20 mg by mouth 2 (two) times daily as needed for heartburn.      Marland Kitchen glucose blood test strip Use as instructed  100 each  12  . insulin glargine (LANTUS) 100 UNIT/ML injection Inject 18 Units into the skin at bedtime.      . isosorbide mononitrate (IMDUR) 30 MG 24 hr tablet Take 30 mg by mouth daily.      Marland Kitchen lisinopril (PRINIVIL,ZESTRIL) 10 MG tablet Take 10 mg by mouth 2 (two) times daily.      Marland Kitchen MAGNESIUM PO Take 1 tablet by mouth daily.      . Multiple Vitamin (MULTIVITAMIN WITH MINERALS) TABS Take 1 tablet by mouth daily.      . nitroGLYCERIN (NITROSTAT) 0.4 MG SL tablet Place 0.4 mg under the tongue every 5 (five) minutes as needed for chest pain.      . SitaGLIPtin-MetFORMIN HCl (JANUMET XR) 50-1000 MG TB24 Take 1 tablet by mouth 2 (two) times daily.  30 tablet    . spironolactone (ALDACTONE) 25 MG tablet Take 12.5 mg by mouth daily.        No current facility-administered medications for this visit.    Allergies:   No Known Allergies  Social History:  The patient  reports that he quit smoking about 20 years ago. His smoking use included Cigarettes. He has a 25 pack-year smoking history. He has never used smokeless tobacco. He reports that  drinks alcohol. He reports that he does not use illicit drugs.   ROS:  Please see the history of present illness.      All other systems reviewed and negative.   PHYSICAL EXAM: VS:  BP 144/78  Pulse 67  Ht 6\' 1"  (1.854 m)  Wt 260 lb 12.8 oz (118.298 kg)  BMI 34.42 kg/m2 Well nourished, well developed, in no acute distress HEENT: normal Neck: no JVD Cardiac:  normal S1, S2; RRR; no murmur Lungs:  clear to auscultation bilaterally, no wheezing, rhonchi or rales Abd: soft, nontender, no hepatomegaly Ext: no edema; right wrist without hematoma or bruit  Skin: warm and dry Neuro:  CNs 2-12 intact, no focal abnormalities noted  EKG:  NSR, HR 67, iRBBB, NSSTTW changes, no change from prior tracing     ASSESSMENT AND PLAN:  1. CAD: Doing well since most recent PCI. Denies any further chest pain. Continue dual antiplatelet therapy, statin therapy. He is not interested in cardiac rehabilitation. He plans to continue walking on his own. 2. Hypertension: Controlled. Continue current therapy. 3. Hyperlipidemia: Continue statin. 4. Disposition: He may return to work at full duty. He can followup with Dr. Shirlee Latch in 6-8 weeks.  Luna Glasgow, PA-C  12:33 PM 01/09/2013

## 2013-01-12 ENCOUNTER — Ambulatory Visit: Payer: BC Managed Care – PPO | Admitting: Cardiology

## 2013-03-21 ENCOUNTER — Ambulatory Visit: Payer: BC Managed Care – PPO | Admitting: Internal Medicine

## 2013-03-27 ENCOUNTER — Other Ambulatory Visit: Payer: Self-pay | Admitting: *Deleted

## 2013-03-27 MED ORDER — CARVEDILOL 12.5 MG PO TABS: 12.5000 mg | ORAL_TABLET | Freq: Two times a day (BID) | ORAL | Status: DC

## 2013-03-30 ENCOUNTER — Encounter: Payer: Self-pay | Admitting: Internal Medicine

## 2013-03-30 ENCOUNTER — Ambulatory Visit (INDEPENDENT_AMBULATORY_CARE_PROVIDER_SITE_OTHER): Payer: BC Managed Care – PPO | Admitting: Internal Medicine

## 2013-03-30 VITALS — BP 124/76 | HR 70 | Ht 73.0 in | Wt 266.0 lb

## 2013-03-30 DIAGNOSIS — E118 Type 2 diabetes mellitus with unspecified complications: Secondary | ICD-10-CM

## 2013-03-30 NOTE — Patient Instructions (Signed)
Please keep Lantus at 18 if you have a light dinner, But use 20 units if you eat out. Please reduce the Glipizide with dinner to 5 mg if you plan to have a light dinner. Please stop at the lab. Return in 3 months. Please let me know if you still have lows at night.

## 2013-03-30 NOTE — Progress Notes (Signed)
Subjective:     Patient ID: Randy Castro, male   DOB: [birth year], 62 y.o.   MRN: [id number]  HPI Randy Castro is a pleasant 62 y/o man returning for f/u for DM2, dx 1998, uncontrolled, non-insulin dependent, with complications (CAD, s/p MI 07/2011, s/p PTCA with DES, s/p recent PTCA for in-stent restenosis 12/15/12; also ED). He is here with his wife. Last visit was 3 mo ago.  Lab Results  Component Value Date   HGBA1C 8.0* 11/03/2012  Previous HbA1C 7.8%, at the end of last year.  He is now on a regimen of: - Lantus 18 units at night(increased from 15 at last visit) - JanuMet 50/1000 bid - they pay 229$ for this every 3 months - Glipizide 10 mg bid   He and his wife had an appt with nutrition on 08/23/12. They felt that this was very beneficial for them and they started to institute these changes. Especially since his catheterization and PTCA earlier this year, he started to walk and eating better. He relaxed the control lately as it was too hot outside to walk. They are eating out more lately, ~5x a week. Noticed that when he eats home, the sugars in am >> better.   He checks his sugars approx 1/day: - am: 136-203 >> now 127-176 >> 126-181 - before lunch: 159-240 >> 102-170, one value at 84 >> 108 - 158 - before dinner: 166-254 >> 93-214, mostly in the mid-100's >> 132-158 - bedtime: 152-279 >> 233 and 274 >> not checking now Highest 196 - forgot meds for 3 days in a row. Lowest: 69 (in 05/201. He has hypoglycemia awareness at 63.  - no DR per last eye exam on 07/16/2012 - Last ACR was 2.3 in 03/2011, normal BUN/Cr (8/0.68 - in 12/2012) - No neuropathy per last foot exam 07/2012.  PMH:   Pt had a period off the meds for cost reasons and was restarted on DM meds in 03/2011. In 07/2011, he had a NSTEMI and had a DES placed. He was also dx with CHF, which is now improved (EF increased from 40-45% in 07/2011 to 55-60% in 10/2011). Sees Dr. Shirlee Latch with Cardiology. He also has HL (on Lipitor)  and HTN (on Lisinopril, Coreg, Spironolactone). His latest lipid panel (12/2011) was: 103/148/34/40. Last TSH normal, 0.55, in 07/2011. He had CP/SOB for few months >> saw Dr. Antoine Poche on 12/15/3012 >> heart catheterization >> PTCA for LAD instent restenosis >> feels much better after this.   I reviewed pt's medications, allergies, PMH, social hx, family hx and no changes required, except as above.  Review of Systems Constitutional: + weight gain, + fatigue, no subjective hyperthermia/hypothermia; + poor sleep Eyes: no blurry vision, no xerophthalmia ENT: no sore throat, no nodules palpated in throat, no dysphagia/odynophagia, no hoarseness; + tinnitus Cardiovascular: had CP and SOB, but now resolved after his PTCA/no palpitations/leg swelling Respiratory: no cough/SOB Gastrointestinal: no N/V/D/C, + GERD Musculoskeletal: + both muscle/joint aches Skin: no rashes; + easy bruising Neurological: no tremors/numbness/tingling/dizziness  Objective:   Physical Exam BP 124/76  Pulse 70  Ht 6\' 1"  (1.854 m)  Wt 266 lb (120.657 kg)  BMI 35.1 kg/m2  SpO2 96% Wt Readings from Last 3 Encounters:  03/30/13 266 lb (120.657 kg)  01/09/13 260 lb 12.8 oz (118.298 kg)  12/23/12 258 lb (117.028 kg)   Constitutional: overweight, in NAD Eyes: PERRLA, EOMI, no exophthalmos ENT: moist mucous membranes, no thyromegaly, no cervical lymphadenopathy Cardiovascular: RRR, No MRG Respiratory: CTA B  Gastrointestinal: abdomen soft, NT, ND, BS+ Musculoskeletal: no deformities, strength intact in all 4 Skin: moist, warm, no rashes Neurological: no tremor with outstretched hands, DTR normal in all 4   Assessment:     1. DM2, non-insulin dependent, uncontrolled, with complications: - CAD, s/p NSTEMI 07/2011, with PTCA + DES (Dr. Shirlee Latch), s/p PTCA for in-stent restenosis 12/15/2012 - CHF, EF 55-60% per 2D ECHO in 10/2011 - ED - no DR - no nephropathy 2. Class II Obesity, Body mass index is 34.83 kg/(m^2).     Plan:     Patient's sugars are much improved after addition of Lantus. However, after he relaxed his diet and exercise since last visit, his sugars increased especially in the morning. He does not check at bedtime or 2 hours after dinner, but I suspect these are high too, since he eats out most days of a week. He noticed that his sugars are much better in the mornings if he eats dinner at home, with mostly vegetables. I strongly advised him to get back to his previous diet as he could see how much it impacted his sugars. He had few lows at night mostly after he had light dinners. We also discussed about the benefits of the Mediterranean diet, and I suggested that he tries this, since this will likely benefit his heart status, weight, and diabetes control. Also advised him to restart his exercise. - I will continue his glipizide 10 mg twice a day for now, but advised him to cut to 5 mg with dinner if he plans to have a light dinner, at home - Will continue the Janumet 50/1000  - Since patient's sugars are > goal of <130 in the morning, will keep his Lantus at 18 units if he plans to have a light meal, but increase to 20, if he plans to eat out - no prescriptions needed for now - we will check a hemoglobin A1c today - patient will return in 3 months with his sugar log  Office Visit on 03/30/2013  Component Date Value Range Status  . Hemoglobin A1C 03/30/2013 8.8* 4.6 - 6.5 % Final   Glycemic Control Guidelines for People with Diabetes:Non Diabetic:  <6%Goal of Therapy: <7%Additional Action Suggested:  >8%     Msg sent; Dear Randy Castro, The HbA1c is higher. Please try to implement the dietary measures that we discussed about.  Please let me know if you have any questions. Sincerely, Carlus Pavlov MD

## 2013-04-11 ENCOUNTER — Other Ambulatory Visit: Payer: Self-pay | Admitting: Cardiology

## 2013-04-12 ENCOUNTER — Encounter: Payer: Self-pay | Admitting: Cardiology

## 2013-04-12 ENCOUNTER — Ambulatory Visit (INDEPENDENT_AMBULATORY_CARE_PROVIDER_SITE_OTHER): Payer: BC Managed Care – PPO | Admitting: Cardiology

## 2013-04-12 VITALS — BP 132/72 | HR 63 | Ht 73.0 in | Wt 266.0 lb

## 2013-04-12 DIAGNOSIS — I10 Essential (primary) hypertension: Secondary | ICD-10-CM

## 2013-04-12 DIAGNOSIS — I2589 Other forms of chronic ischemic heart disease: Secondary | ICD-10-CM

## 2013-04-12 DIAGNOSIS — E785 Hyperlipidemia, unspecified: Secondary | ICD-10-CM

## 2013-04-12 DIAGNOSIS — I2510 Atherosclerotic heart disease of native coronary artery without angina pectoris: Secondary | ICD-10-CM

## 2013-04-12 LAB — LIPID PANEL
Cholesterol: 117 mg/dL (ref 0–200)
HDL: 32.6 mg/dL — ABNORMAL LOW (ref >39.00)
Total CHOL/HDL Ratio: 4
Triglycerides: 269 mg/dL — ABNORMAL HIGH (ref 0.0–149.0)
VLDL: 53.8 mg/dL — ABNORMAL HIGH (ref 0.0–40.0)

## 2013-04-12 LAB — BASIC METABOLIC PANEL
CO2: 28 mEq/L (ref 19–32)
Calcium: 9.4 mg/dL (ref 8.4–10.5)
Creatinine, Ser: 0.9 mg/dL (ref 0.4–1.5)
GFR: 86.54 mL/min (ref >60.00)

## 2013-04-12 MED ORDER — ATORVASTATIN CALCIUM 80 MG PO TABS: 80.0000 mg | ORAL_TABLET | Freq: Every day | ORAL | Status: DC

## 2013-04-12 MED ORDER — SPIRONOLACTONE 25 MG PO TABS: ORAL_TABLET | ORAL | Status: DC

## 2013-04-12 MED ORDER — LISINOPRIL 10 MG PO TABS: 10.0000 mg | ORAL_TABLET | Freq: Two times a day (BID) | ORAL | Status: DC

## 2013-04-12 NOTE — Patient Instructions (Addendum)
Your physician recommends that you have lab work today--BMET/Lipid profile.  Your physician has requested that you have an echocardiogram. Echocardiography is a painless test that uses sound waves to create images of your heart. It provides your doctor with information about the size and shape of your heart and how well your heart's chambers and valves are working. This procedure takes approximately one hour. There are no restrictions for this procedure.  Your physician recommends that you schedule a follow-up appointment in: 4 months with Dr Shirlee Latch.

## 2013-04-12 NOTE — Progress Notes (Signed)
Patient ID: Randy Castro, male   DOB: [birth year], 62 y.o.   MRN: [id number] PCP: Dr. Drue Castro  62 yo with history of DM and HTN had NSTEMI in 12/12, unstable angina in 4/14 and 5/14.  He had DES to mid LAD in 12/12.  EF was 40% with apical hypokinesis at the time of the MI.  Repeat echo in 3/13 showed EF 55-60%.  In 4/14, he was admitted with unstable angina and had 90% pLAD in-stent restenosis.  This was treated with cutting balloon angioplasty.  He was readmitted, again with unstable angina, in 5/14.  This time he had PTCA to 90% ostial D1 stenosis.    Patient has been doing well in general.  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  No chest pain.  He is able to do his job without problems.  He is mildly winded after walking up 3 flights of stairs.  He walks about a mile for exercise most days of the week.    Labs (1/13): LDL 47, HDL 35, K 3.7, creatinine 1.0 Labs (2/13): K 4.1, creatinine 0.7 Labs (5/13): K 4.5, creatinine 0.8, LDL 40, HDL 34 Labs (5/14): K 4.1, creatinine 0.68  PMH: 1. Diabetes mellitus 2. HTN 3. Hyperlipidemia 4. H/o neck surgery 5. CAD: NSTEMI in 12/12 with 99% mid LAD stenosis treated with DES 3 x 16 Promus to mid LAD.  6. Ischemic cardiomyopathy: EF 40-45% with anterior HK on LV-gram in 12/12.  Echo (12/12) with EF 40%, apical hypokinesis, mild LVH.  Echo (3/13) showed recovery of LV systolic function with EF 55-60%, grade I diastolic dysfunction, mild MR.  Unstable angina 4/14 with LHC showing 90% pLAD in-stent restenosis.  This was treated with cutting balloon angioplasty.  He was readmitted again with unstable angina in 5/14.  LHC showed 80-90% ostial D1 which was treated by PTCA.   SH: Married, lives in [place].  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  Quit smoking in 1993.  Enjoys restoring hot rods.   FH: Father with MI at 40.  Brother with CAD.   ROS: All systems reviewed and negative except as per HPI.   Current Outpatient  Prescriptions  Medication Sig Dispense Refill  . acetaminophen (TYLENOL) 325 MG tablet Take 650 mg by mouth every 6 (six) hours as needed for pain.      Marland Kitchen aspirin EC 81 MG tablet Take 81 mg by mouth daily.      Marland Kitchen atorvastatin (LIPITOR) 80 MG tablet Take 1 tablet (80 mg total) by mouth daily.  90 tablet  1  . carvedilol (COREG) 12.5 MG tablet Take 1 tablet (12.5 mg total) by mouth 2 (two) times daily with a meal.  90 tablet  1  . clopidogrel (PLAVIX) 75 MG tablet Take 75 mg by mouth daily.      . clotrimazole-betamethasone (LOTRISONE) cream Apply 1 application topically 2 (two) times daily.      . famotidine (PEPCID) 20 MG tablet Take 20 mg by mouth 2 (two) times daily as needed for heartburn.      Marland Kitchen glucose blood test strip Use as instructed  100 each  12  . insulin glargine (LANTUS) 100 UNIT/ML injection Inject 18 Units into the skin at bedtime.      . isosorbide mononitrate (IMDUR) 30 MG 24 hr tablet Take 30 mg by mouth daily.      Marland Kitchen lisinopril (PRINIVIL,ZESTRIL) 10 MG tablet Take 1 tablet (10 mg total) by mouth 2 (two) times  daily.  180 tablet  3  . MAGNESIUM PO Take 1 tablet by mouth daily.      . Multiple Vitamin (MULTIVITAMIN WITH MINERALS) TABS Take 1 tablet by mouth daily.      . nitroGLYCERIN (NITROSTAT) 0.4 MG SL tablet Place 0.4 mg under the tongue every 5 (five) minutes as needed for chest pain.      . SitaGLIPtin-MetFORMIN HCl (JANUMET XR) 50-1000 MG TB24 Take 1 tablet by mouth 2 (two) times daily.  30 tablet    . spironolactone (ALDACTONE) 25 MG tablet TAKE 0.5 TABLETS (12.5 MG TOTAL) BY MOUTH DAILY.  15 tablet  6   No current facility-administered medications for this visit.   BP 132/72  Pulse 63  Ht 6\' 1"  (1.854 m)  Wt 120.657 kg (266 lb)  BMI 35.1 kg/m2  SpO2 98% General: NAD Neck: Thick, no JVD, no thyromegaly or thyroid nodule.  Lungs: Clear to auscultation bilaterally with normal respiratory effort. CV: Nondisplaced PMI.  Heart regular S1/S2, no S3/S4, no murmur.  No  peripheral edema.  No carotid bruit.  Normal pedal pulses.  Abdomen: Soft, nontender, no hepatosplenomegaly, no distention.  Neurologic: Alert and oriented x 3.  Psych: Normal affect. Extremities: No clubbing or cyanosis.   Assessment/Plan:  Coronary Artery Disease  No current ischemic symptoms. Continue ASA, Plavix, ACEI, beta blocker, statin.  I will continue plavix long-term. He is walking on his own as he is not able to do cardiac rehab with his work schedule.  Ischemic Cardiomyopathy  EF has returned to normal, 55-60% on 3/13 echo. Continue current doses of Coreg, lisinopril, and spironolactone. Will get a BMET today.  I am going to get a repeat echo after recent hospitalizations with unstable angina.  Hyperlipidemia Check lipids today.  Weight Gain  I talked to him again today about watching his dietary intake and cutting back especially on bread products and restaurant food.   Randy Castro 04/12/2013

## 2013-04-14 ENCOUNTER — Ambulatory Visit (HOSPITAL_COMMUNITY): Payer: BC Managed Care – PPO | Attending: Cardiology | Admitting: Radiology

## 2013-04-14 DIAGNOSIS — R079 Chest pain, unspecified: Secondary | ICD-10-CM | POA: Insufficient documentation

## 2013-04-14 DIAGNOSIS — I2589 Other forms of chronic ischemic heart disease: Secondary | ICD-10-CM | POA: Insufficient documentation

## 2013-04-14 DIAGNOSIS — I252 Old myocardial infarction: Secondary | ICD-10-CM | POA: Insufficient documentation

## 2013-04-14 DIAGNOSIS — R5381 Other malaise: Secondary | ICD-10-CM | POA: Insufficient documentation

## 2013-04-14 DIAGNOSIS — E119 Type 2 diabetes mellitus without complications: Secondary | ICD-10-CM | POA: Insufficient documentation

## 2013-04-14 DIAGNOSIS — I10 Essential (primary) hypertension: Secondary | ICD-10-CM | POA: Insufficient documentation

## 2013-04-14 DIAGNOSIS — I2510 Atherosclerotic heart disease of native coronary artery without angina pectoris: Secondary | ICD-10-CM | POA: Insufficient documentation

## 2013-04-14 DIAGNOSIS — E785 Hyperlipidemia, unspecified: Secondary | ICD-10-CM | POA: Insufficient documentation

## 2013-04-14 DIAGNOSIS — Z87891 Personal history of nicotine dependence: Secondary | ICD-10-CM | POA: Insufficient documentation

## 2013-04-14 DIAGNOSIS — M1990 Unspecified osteoarthritis, unspecified site: Secondary | ICD-10-CM | POA: Insufficient documentation

## 2013-04-14 NOTE — Progress Notes (Signed)
Echocardiogram performed.  

## 2013-04-21 ENCOUNTER — Telehealth: Payer: Self-pay | Admitting: Internal Medicine

## 2013-04-21 MED ORDER — SITAGLIP PHOS-METFORMIN HCL ER 50-1000 MG PO TB24: 1.0000 | ORAL_TABLET | Freq: Two times a day (BID) | ORAL | Status: DC

## 2013-04-21 NOTE — Telephone Encounter (Signed)
Pt needed an rx refill.

## 2013-05-13 ENCOUNTER — Other Ambulatory Visit: Payer: Self-pay | Admitting: Cardiology

## 2013-05-30 ENCOUNTER — Telehealth: Payer: Self-pay

## 2013-05-30 NOTE — Telephone Encounter (Signed)
T-dap not UTD

## 2013-05-30 NOTE — Telephone Encounter (Signed)
Medication List and allergies: done  Pharmacy updated, uses [facility] for 90 day supply Pharmacy undated, uses [facility] for local prescriptions  HM UTD: no  A/P: HM due: flu vaccine PSA: Not on file: CCS: Never  Last: hemeocult 3/13 WNL DM:     Endocrinology HTN: Due   To Discuss with Provider: Information provided by spouse

## 2013-05-31 ENCOUNTER — Encounter: Payer: Self-pay | Admitting: Internal Medicine

## 2013-05-31 ENCOUNTER — Ambulatory Visit (INDEPENDENT_AMBULATORY_CARE_PROVIDER_SITE_OTHER): Payer: BC Managed Care – PPO | Admitting: Internal Medicine

## 2013-05-31 VITALS — BP 119/70 | HR 70 | Temp 98.1°F | Ht 75.0 in | Wt 270.8 lb

## 2013-05-31 DIAGNOSIS — F419 Anxiety disorder, unspecified: Secondary | ICD-10-CM | POA: Insufficient documentation

## 2013-05-31 DIAGNOSIS — Z0000 Encounter for general adult medical examination without abnormal findings: Secondary | ICD-10-CM

## 2013-05-31 DIAGNOSIS — F329 Major depressive disorder, single episode, unspecified: Secondary | ICD-10-CM

## 2013-05-31 DIAGNOSIS — R5383 Other fatigue: Secondary | ICD-10-CM

## 2013-05-31 DIAGNOSIS — Z23 Encounter for immunization: Secondary | ICD-10-CM

## 2013-05-31 LAB — CBC WITH DIFFERENTIAL/PLATELET
Basophils Absolute: 0.1 10*3/uL (ref 0.0–0.1)
Basophils Relative: 0.7 % (ref 0.0–3.0)
Eosinophils Absolute: 0.4 10*3/uL (ref 0.0–0.7)
HCT: 37 % — ABNORMAL LOW (ref 39.0–52.0)
Hemoglobin: 12.5 g/dL — ABNORMAL LOW (ref 13.0–17.0)
Lymphocytes Relative: 22.1 % (ref 12.0–46.0)
MCHC: 33.9 g/dL (ref 30.0–36.0)
MCV: 84 fl (ref 78.0–100.0)
Monocytes Absolute: 0.6 10*3/uL (ref 0.1–1.0)
Monocytes Relative: 7.9 % (ref 3.0–12.0)
Neutro Abs: 4.8 10*3/uL (ref 1.4–7.7)
Neutrophils Relative %: 64.4 % (ref 43.0–77.0)
RBC: 4.41 Mil/uL (ref 4.22–5.81)
RDW: 14.6 % (ref 11.5–14.6)

## 2013-05-31 LAB — AST: AST: 29 U/L (ref 0–37)

## 2013-05-31 LAB — ALT: ALT: 32 U/L (ref 0–53)

## 2013-05-31 LAB — TSH: TSH: 0.67 u[IU]/mL (ref 0.35–5.50)

## 2013-05-31 MED ORDER — CITALOPRAM HYDROBROMIDE 20 MG PO TABS: 20.0000 mg | ORAL_TABLET | Freq: Every day | ORAL | Status: DC

## 2013-05-31 NOTE — Progress Notes (Signed)
Subjective:    Patient ID: Randy Castro, male    DOB: 08-May-1951, 62 y.o.   MRN: [id number]  HPI CPX, Here with his wife Also reports a one-month history of not feeling well emotionally, nervous, sad   "Like I have a lot of pressure on me ", Denies any suicidal ideas.   Past Medical History  Diagnosis Date  . Hyperlipidemia   . HTN (hypertension)   . CAD (coronary artery disease)     a. NSTEMI 12/12: EF 40-45%. EXB:MWUXLKG balloon PTCA + Promus DES x 1 to mid LAD;  b. 11/2012: Cath with Cutting balloon PTCA  LAD for ISR to LAD, EF 55%;  c. 12/2012 Cath/PCI: LAD stents patent, 80 ost Diag (jailed)->PTCA, LCX/RCA patent.  . Ischemic cardiomyopathy     a.  echo 08/06/11: dist ant wall, apical and septal and infero-apical HK, mild LVH, EF 40%, mild LAE, PASP 34, asc aorta mildly dilated, mild TR;  b.  Echo (3/13) showed recovery of LV systolic function with EF 55-60%, grade I diastolic dysfunction, mild MR.   Marland Kitchen Exertional shortness of breath   . Type II diabetes mellitus dx in the 90s  . Arthritis     "knees probably" (12/22/2012)   Past Surgical History  Procedure Laterality Date  . Anterior cervical decomp/discectomy fusion  in the 90s  . Coronary angioplasty with stent placement  07/2011    "1" (12/22/2012)  . Coronary angioplasty  12/15/2012; 12/22/2012   History   Social History  . Marital Status: Married    Spouse Name: N/A    Number of Children: 2   . Years of Education: N/A   Occupational History  . maintenance tech    Social History Main Topics  . Smoking status: Former Smoker -- 1.00 packs/day for 25 years    Types: Cigarettes    Quit date: 04/12/1992  . Smokeless tobacco: Never Used  . Alcohol Use: Yes     Comment: 12/22/2012 "rarely maybe every 3-4 months, beer"  . Drug Use: No  . Sexual Activity: Yes   Other Topics Concern  . Not on file   Social History Narrative   Moved back to [place] from [facility] 2011   Family History  Problem Relation Age of Onset  . Colon cancer  Neg Hx   . Prostate cancer Neg Hx   . Diabetes Mother     M, sister, brothers   . Coronary artery disease Father     F , MI at age 49   . Stroke Neg Hx     Review of Systems Diet-- getting better Exercise-- takes walk ~3/week No  SSCP, ? Paroxysmal nocturnal dyspnea x 1 (few days ago, pt can't describe well his sx, ?nerves) Denies  nausea, vomiting diarrhea Denies  blood in the stools occ  GERD  Sx. (-) cough, sputum production No dysuria, gross hematuria, difficulty urinating          Objective:   Physical Exam BP 119/70  Pulse 70  Temp(Src) 98.1 F (36.7 C)  Ht 6\' 3"  (1.905 m)  Wt 270 lb 12.8 oz (122.834 kg)  BMI 33.85 kg/m2  SpO2 96% General -- alert, well-developed, NAD.  Neck --no thyromegaly  Lungs -- normal respiratory effort, no intercostal retractions, no accessory muscle use, and normal breath sounds.  Heart-- normal rate, regular rhythm, no murmur.  Abdomen-- Not distended, good bowel sounds,soft, non-tender.  Rectal-- No external abnormalities noted. Normal sphincter tone. No rectal masses or tenderness. Hitt stool,  Hemoccult negative  Prostate--Prostate gland firm and smooth, no enlargement, nodularity, tenderness, mass, asymmetry or induration. Extremities-- no pretibial edema bilaterally  Neurologic--  alert & oriented X3. Speech normal, gait normal, strength normal in all extremities.  Psych-- Cognition and judgment appear intact. Cooperative with normal attention span and concentration. slt anxious but no depressed appearing.      Assessment & Plan:

## 2013-05-31 NOTE — Patient Instructions (Signed)
Start citalopram 20 mg tablets: Half tablet at bedtime for one week, then increase to one tablet at bedtime every night. Please come back in one month.

## 2013-05-31 NOTE — Assessment & Plan Note (Addendum)
Today he reports some fatigue, he does not snore, does not feel sleepy. Plan monitor the situation

## 2013-05-31 NOTE — Assessment & Plan Note (Addendum)
See history of present illness, the patient reports depression and anxiety, PHQ 9 ----> scored 15 which is moderate to severe depression.  Denies suicidal ideas. The patient is counseled. Plan: Citalopram, s/e discussed including suicidality Reassess in one month

## 2013-05-31 NOTE — Assessment & Plan Note (Addendum)
Td 2014 zostavax 2014 Fly shot today Pneumonia shot 12-12  chronic medical problems monitored by cardiology and endocrinology  Never had a cscope, recovering from a stent, Will discuss colonoscopy next year.iFOB provided  Labs  Discussed  life style  Is referred to dermatology last year for a skin lesion in the right face, he did followup with them and  problem was solved.

## 2013-06-02 ENCOUNTER — Telehealth: Payer: Self-pay | Admitting: *Deleted

## 2013-06-02 NOTE — Telephone Encounter (Signed)
Pt notified to schedule lab visit for additional labs to be done. DJR

## 2013-06-02 NOTE — Addendum Note (Signed)
Addended by: [name] on: 06/02/2013 11:12 AM   Modules accepted: Orders

## 2013-06-05 ENCOUNTER — Other Ambulatory Visit: Payer: Self-pay | Admitting: *Deleted

## 2013-06-05 MED ORDER — GLIPIZIDE 10 MG PO TABS: 10.0000 mg | ORAL_TABLET | Freq: Two times a day (BID) | ORAL | Status: DC

## 2013-06-07 ENCOUNTER — Other Ambulatory Visit (INDEPENDENT_AMBULATORY_CARE_PROVIDER_SITE_OTHER): Payer: BC Managed Care – PPO

## 2013-06-07 DIAGNOSIS — R5381 Other malaise: Secondary | ICD-10-CM

## 2013-06-07 DIAGNOSIS — R5383 Other fatigue: Secondary | ICD-10-CM

## 2013-06-07 LAB — IRON: Iron: 83 ug/dL (ref 42–165)

## 2013-06-13 ENCOUNTER — Other Ambulatory Visit: Payer: Self-pay | Admitting: Cardiology

## 2013-06-29 ENCOUNTER — Other Ambulatory Visit: Payer: Self-pay | Admitting: *Deleted

## 2013-06-29 ENCOUNTER — Ambulatory Visit: Payer: BC Managed Care – PPO | Admitting: Internal Medicine

## 2013-06-29 ENCOUNTER — Telehealth: Payer: Self-pay | Admitting: Internal Medicine

## 2013-06-29 MED ORDER — SITAGLIP PHOS-METFORMIN HCL ER 50-1000 MG PO TB24: 1.0000 | ORAL_TABLET | Freq: Two times a day (BID) | ORAL | Status: DC

## 2013-07-03 ENCOUNTER — Ambulatory Visit (INDEPENDENT_AMBULATORY_CARE_PROVIDER_SITE_OTHER): Payer: BC Managed Care – PPO | Admitting: Internal Medicine

## 2013-07-03 ENCOUNTER — Encounter: Payer: Self-pay | Admitting: Internal Medicine

## 2013-07-03 VITALS — BP 107/65 | HR 60 | Temp 98.5°F | Wt 265.0 lb

## 2013-07-03 DIAGNOSIS — F419 Anxiety disorder, unspecified: Secondary | ICD-10-CM

## 2013-07-03 DIAGNOSIS — F341 Dysthymic disorder: Secondary | ICD-10-CM

## 2013-07-03 DIAGNOSIS — F329 Major depressive disorder, single episode, unspecified: Secondary | ICD-10-CM

## 2013-07-03 MED ORDER — CITALOPRAM HYDROBROMIDE 20 MG PO TABS: 20.0000 mg | ORAL_TABLET | Freq: Every day | ORAL | Status: DC

## 2013-07-03 NOTE — Assessment & Plan Note (Addendum)
Patient started citalopram at  the last office visit, has improved significantly. No obvious triggers for sx however in retrospect the patient felt slightly depressed since his heart attack a while back. At this point he's doing better, he is counseled here, will refill medications and ask him to followup in 4-5 months.

## 2013-07-03 NOTE — Progress Notes (Signed)
Pre visit review using our clinic review tool, if applicable. No additional management support is needed unless otherwise documented below in the visit note. 

## 2013-07-03 NOTE — Progress Notes (Signed)
  Subjective:    Patient ID: Randy Castro, male    DOB: 30-Dec-1950, 62 y.o.   MRN: [id number]  HPI Followup from previous visit Patient started citalopram, he is doing great. Sleeping better, decrease anxiety, not having problems with difficulty breathing or anxiety at night. In retrospect, symptoms started after his heart attack, he denies any family issues, he is going to retire in few weeks and is very happy about it.   Past Medical History  Diagnosis Date  . Hyperlipidemia   . HTN (hypertension)   . CAD (coronary artery disease)     a. NSTEMI 12/12: EF 40-45%. MVH:QIONGEX balloon PTCA + Promus DES x 1 to mid LAD;  b. 11/2012: Cath with Cutting balloon PTCA  LAD for ISR to LAD, EF 55%;  c. 12/2012 Cath/PCI: LAD stents patent, 80 ost Diag (jailed)->PTCA, LCX/RCA patent.  . Ischemic cardiomyopathy     a.  echo 08/06/11: dist ant wall, apical and septal and infero-apical HK, mild LVH, EF 40%, mild LAE, PASP 34, asc aorta mildly dilated, mild TR;  b.  Echo (3/13) showed recovery of LV systolic function with EF 55-60%, grade I diastolic dysfunction, mild MR.   Marland Kitchen Exertional shortness of breath   . Type II diabetes mellitus dx in the 90s  . Arthritis     "knees probably" (12/22/2012)   Past Surgical History  Procedure Laterality Date  . Anterior cervical decomp/discectomy fusion  in the 90s  . Coronary angioplasty with stent placement  07/2011    "1" (12/22/2012)  . Coronary angioplasty  12/15/2012; 12/22/2012    Review of Systems Denies nausea, vomiting, diarrhea. No suicidal thoughts.In general feels more motivated, working on his car, taking walks Has not reach for counseling    Objective:   Physical Exam BP 107/65  Pulse 60  Temp(Src) 98.5 F (36.9 C)  Wt 265 lb (120.203 kg)  SpO2 95% General -- alert, well-developed, NAD.  Neurologic--  alert & oriented X3. Speech normal, gait normal, strength normal in all extremities.  Psych-- Cognition and judgment appear intact. Cooperative  with normal attention span and concentration. No anxious appearing , no depressed appearing.        Assessment & Plan:  Today , I spent more than 15 min with the patient, >50% of the time counseling

## 2013-07-03 NOTE — Patient Instructions (Signed)
Next visit in  4 to 5 months for a follow up . No Fasting Please make an appointment

## 2013-07-15 ENCOUNTER — Other Ambulatory Visit: Payer: Self-pay | Admitting: Internal Medicine

## 2013-07-16 ENCOUNTER — Other Ambulatory Visit: Payer: Self-pay | Admitting: Cardiology

## 2013-07-17 NOTE — Telephone Encounter (Signed)
rx refilled per prot col. DJR

## 2013-07-18 ENCOUNTER — Other Ambulatory Visit: Payer: Self-pay | Admitting: Internal Medicine

## 2013-07-18 NOTE — Telephone Encounter (Signed)
Isosorbide mononitrate refilled per protocol

## 2013-08-03 ENCOUNTER — Ambulatory Visit (INDEPENDENT_AMBULATORY_CARE_PROVIDER_SITE_OTHER): Payer: BC Managed Care – PPO | Admitting: Internal Medicine

## 2013-08-03 ENCOUNTER — Encounter: Payer: Self-pay | Admitting: Internal Medicine

## 2013-08-03 VITALS — BP 128/70 | HR 69 | Temp 97.8°F | Wt 265.0 lb

## 2013-08-03 DIAGNOSIS — E118 Type 2 diabetes mellitus with unspecified complications: Secondary | ICD-10-CM

## 2013-08-03 MED ORDER — SITAGLIP PHOS-METFORMIN HCL ER 50-1000 MG PO TB24: 1.0000 | ORAL_TABLET | Freq: Two times a day (BID) | ORAL | Status: DC

## 2013-08-03 NOTE — Progress Notes (Signed)
Subjective:     Patient ID: Randy Castro, male   DOB: 07-07-1951, 62 y.o.   MRN: [id number]  HPI Mr. Weekley is a pleasant 62 y.o. man returning for f/u for DM2, dx 1998, uncontrolled, non-insulin dependent, with complications (CAD, s/p MI 07/2011, s/p PTCA with DES, s/p recent PTCA for in-stent restenosis 12/15/12; also ED). He is here with his wife. Last visit was 4 mo ago.  He will retire on 08/15/2013. He is excited about this.  Reviewed most recent HbA1c results: Lab Results  Component Value Date   HGBA1C 8.8* 03/30/2013   HGBA1C 8.0* 11/03/2012   HGBA1C 7.8* 07/28/2012   He is now on a regimen of: - Lantus 18 (20 if eats out) units at night - JanuMet 50/1000 bid  - Glipizide 10 mg bid   He and his wife had an appt with nutrition on 08/23/12. They felt that this was very beneficial for them.  He checks his sugars approx 1/day - reviewed his log:  - am: 136-203 >> now 127-176 >> 126-181 >> 100-186 - before lunch: 159-240 >> 102-170, one value at 84 >> 108 - 158 >> 90s-122 - before dinner: 166-254 >> 93-214, mostly in the mid-100's >> 132-158 >> 72-122 - bedtime: 152-279 >> 233 and 274 >> not checking now >> 104-148 - nighttime: 3 lows 58-60s Highest 186. Lowest: 58. He has hypoglycemia awareness at 69.  - no DR per last eye exam on 07/16/2012 - Last ACR was 2.3 in 03/2011, normal BUN/Cr:  Lab Results  Component Value Date   BUN 13 04/12/2013   CREATININE 0.9 04/12/2013  He is on lisinopril. - No neuropathy per last foot exam 07/2012 - He has HL.  He is on Lipitor He is on ASA 81.  PMH:   Pt had a period off the meds for cost reasons and was restarted on DM meds in 03/2011. In 07/2011, he had a NSTEMI and had a DES placed. He was also dx with CHF, which is now improved (EF increased from 40-45% in 07/2011 to 55-60% in 10/2011). Sees Dr. Shirlee Latch with Cardiology.    He had CP/SOB for few months >> saw Dr. Antoine Poche on 12/15/3012 >> heart catheterization >> PTCA for LAD instent  restenosis >> feels much better after this.   I reviewed pt's medications, allergies, PMH, social hx, family hx and no changes required, except as above.  Review of Systems Constitutional: no weight gain, no fatigue, no subjective hyperthermia/hypothermia; + poor sleep Eyes: no blurry vision, no xerophthalmia ENT: no sore throat, no nodules palpated in throat, no dysphagia/odynophagia, no hoarseness; + tinnitus Cardiovascular: had CP and SOB, but now resolved after his PTCA/no palpitations/leg swelling Respiratory: no cough/SOB Gastrointestinal: no N/V/D/C Musculoskeletal: muscle/joint aches Skin: no rashes;  Neurological: no tremors/numbness/tingling/dizziness  Objective:   Physical Exam BP 128/70  Pulse 69  Temp(Src) 97.8 F (36.6 C) (Oral)  Wt 265 lb (120.203 kg)  SpO2 96% Wt Readings from Last 3 Encounters:  08/03/13 265 lb (120.203 kg)  07/03/13 265 lb (120.203 kg)  05/31/13 270 lb 12.8 oz (122.834 kg)   Constitutional: overweight, in NAD Eyes: PERRLA, EOMI, no exophthalmos ENT: moist mucous membranes, no thyromegaly, no cervical lymphadenopathy Cardiovascular: RRR, No MRG Respiratory: CTA B Gastrointestinal: abdomen soft, NT, ND, BS+ Musculoskeletal: no deformities, strength intact in all 4 Skin: moist, warm, no rashes Neurological: no tremor with outstretched hands, DTR normal in all 4   Assessment:     1. DM2, non-insulin dependent, uncontrolled,  with complications: - CAD, s/p NSTEMI 07/2011, with PTCA + DES (Dr. Shirlee Latch), s/p PTCA for in-stent restenosis 12/15/2012 - CHF, EF 55-60% per 2D ECHO in 10/2011 - ED - no DR - no nephropathy 2. Class II Obesity, Body mass index is 34.83 kg/(m^2).    Plan:     Patient's sugars are much improved after addition of Lantus. However, sugars higher in the mornings and he might have lows later in the day or occasionally in the middle of the night. - I advised him to: Patient Instructions  Please take the JanuMet all in  am, with breakfast. Decrease the Glipizide to 5 mg 2x a day. Increase Lantus to 22 units.  - refilled Janumet and given discount cards for Lantus and Janumet -  We discussed about the benefits of the Mediterranean diet at last visit. He incorporated changes and  he will restart exercise and cooking/eating healthier after he retires at the end of this month. - we will check a hemoglobin A1c today - patient will return in 3 months with his sugar log  Office Visit on 08/03/2013  Component Date Value Range Status  . Hemoglobin A1C 08/03/2013 8.1* 4.6 - 6.5 % Final   Glycemic Control Guidelines for People with Diabetes:Non Diabetic:  <6%Goal of Therapy: <7%Additional Action Suggested:  >8%    Improved, but still a little high. Msg sent.

## 2013-08-03 NOTE — Patient Instructions (Addendum)
Please take the JanuMet all in am, with breakfast. Decrease the Glipizide to 5 mg 2x a day. Increase Lantus to 22 units.  Please return in 3 months with your sugar log.  Please stop at the lab.

## 2013-08-09 ENCOUNTER — Ambulatory Visit (INDEPENDENT_AMBULATORY_CARE_PROVIDER_SITE_OTHER): Payer: BC Managed Care – PPO | Admitting: Cardiology

## 2013-08-09 ENCOUNTER — Encounter: Payer: Self-pay | Admitting: Cardiology

## 2013-08-09 VITALS — BP 144/66 | HR 63 | Ht 74.0 in | Wt 263.0 lb

## 2013-08-09 DIAGNOSIS — I10 Essential (primary) hypertension: Secondary | ICD-10-CM

## 2013-08-09 DIAGNOSIS — E785 Hyperlipidemia, unspecified: Secondary | ICD-10-CM

## 2013-08-09 DIAGNOSIS — I2510 Atherosclerotic heart disease of native coronary artery without angina pectoris: Secondary | ICD-10-CM

## 2013-08-09 LAB — BASIC METABOLIC PANEL
BUN: 14 mg/dL (ref 6–23)
CO2: 27 mEq/L (ref 19–32)
Calcium: 8.9 mg/dL (ref 8.4–10.5)
Chloride: 105 mEq/L (ref 96–112)
Creatinine, Ser: 0.9 mg/dL (ref 0.4–1.5)
Glucose, Bld: 277 mg/dL — ABNORMAL HIGH (ref 70–99)

## 2013-08-09 NOTE — Patient Instructions (Signed)
Stop Imdur(isosorbide).   Your physician recommends that you have  lab work today--BMET.  Your physician wants you to follow-up in: 6 months with Dr Shirlee Latch. (June 2015).You will receive a reminder letter in the mail two months in advance. If you don't receive a letter, please call our office to schedule the follow-up appointment.

## 2013-08-09 NOTE — Progress Notes (Signed)
Patient ID: Randy Castro, male   DOB: 06/07/1951, 62 y.o.   MRN: [id number] PCP: Dr. Drue Novel  62 yo with history of DM and HTN had NSTEMI in 12/12, unstable angina in 4/14 and 5/14.  He had DES to mid LAD in 12/12.  EF was 40% with apical hypokinesis at the time of the MI.  Repeat echo in 3/13 showed EF 55-60%.  In 4/14, he was admitted with unstable angina and had 90% pLAD in-stent restenosis.  This was treated with cutting balloon angioplasty.  He was readmitted, again with unstable angina, in 5/14.  This time he had PTCA to 90% ostial D1 stenosis.    Patient has been doing well in general.  He is planning on retiring at the end of the year.  No chest pain. 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  He is mildly winded after walking up 3 flights of stairs.  He is not getting exercise outside of work.    ECG: NSR, LVH    Labs (1/13): LDL 47, HDL 35, K 3.7, creatinine 1.0 Labs (2/13): K 4.1, creatinine 0.7 Labs (5/13): K 4.5, creatinine 0.8, LDL 40, HDL 34 Labs (5/14): K 4.1, creatinine 0.68 Labs (8/14): K 4, creatinine 0.9, HDL 33, LDL 59  PMH: 1. Diabetes mellitus 2. HTN 3. Hyperlipidemia 4. H/o neck surgery 5. CAD: NSTEMI in 12/12 with 99% mid LAD stenosis treated with DES 3 x 16 Promus to mid LAD. Unstable angina 4/14 with LHC showing 90% pLAD in-stent restenosis.  This was treated with cutting balloon angioplasty.  He was readmitted again with unstable angina in 5/14.  LHC showed 80-90% ostial D1 which was treated by PTCA.  6. Ischemic cardiomyopathy: EF 40-45% with anterior HK on LV-gram in 12/12.  Echo (12/12) with EF 40%, apical hypokinesis, mild LVH.  Echo (3/13) showed recovery of LV systolic function with EF 55-60%, grade I diastolic dysfunction, mild MR.  Echo (8/14) with EF 60%, moderate LVH.   SH: Married, lives in [place].  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  Quit smoking in 1993.  Enjoys restoring hot rods.   FH: Father with MI at 27.   Brother with CAD.   ROS: All systems reviewed and negative except as per HPI.   Current Outpatient Prescriptions  Medication Sig Dispense Refill  . acetaminophen (TYLENOL) 325 MG tablet Take 650 mg by mouth every 6 (six) hours as needed for pain.      Marland Kitchen aspirin EC 81 MG tablet Take 81 mg by mouth daily.      Marland Kitchen atorvastatin (LIPITOR) 80 MG tablet Take 1 tablet (80 mg total) by mouth daily.  90 tablet  1  . carvedilol (COREG) 12.5 MG tablet Take 1 tablet (12.5 mg total) by mouth 2 (two) times daily with a meal.  90 tablet  1  . citalopram (CELEXA) 20 MG tablet Take 1 tablet (20 mg total) by mouth daily.  90 tablet  2  . clopidogrel (PLAVIX) 75 MG tablet Take 75 mg by mouth daily.      . clotrimazole-betamethasone (LOTRISONE) cream Apply 1 application topically 2 (two) times daily.      . famotidine (PEPCID) 20 MG tablet Take 20 mg by mouth 2 (two) times daily as needed for heartburn.      Marland Kitchen glipiZIDE (GLUCOTROL) 10 MG tablet Take 1 tablet (10 mg total) by mouth 2 (two) times daily before a meal.  180 tablet  1  . glucose blood  test strip Use as instructed  100 each  12  . insulin glargine (LANTUS) 100 UNIT/ML injection Inject 18 Units into the skin at bedtime.      Marland Kitchen lisinopril (PRINIVIL,ZESTRIL) 10 MG tablet Take 1 tablet (10 mg total) by mouth 2 (two) times daily.  180 tablet  3  . MAGNESIUM PO Take 1 tablet by mouth daily.      . Multiple Vitamin (MULTIVITAMIN WITH MINERALS) TABS Take 1 tablet by mouth daily.      . nitroGLYCERIN (NITROSTAT) 0.4 MG SL tablet Place 0.4 mg under the tongue every 5 (five) minutes as needed for chest pain.      . SitaGLIPtin-MetFORMIN HCl (JANUMET XR) 50-1000 MG TB24 Take 1 tablet by mouth 2 (two) times daily.  180 tablet  0  . spironolactone (ALDACTONE) 25 MG tablet TAKE 0.5 TABLETS (12.5 MG TOTAL) BY MOUTH DAILY.  45 tablet  3   No current facility-administered medications for this visit.   BP 144/66  Pulse 63  Ht 6\' 2"  (1.88 m)  Wt 119.296 kg (263 lb)   BMI 33.75 kg/m2 General: NAD Neck: Thick, no JVD, no thyromegaly or thyroid nodule.  Lungs: Clear to auscultation bilaterally with normal respiratory effort. CV: Nondisplaced PMI.  Heart regular S1/S2, no S3/S4, no murmur.  No peripheral edema.  No carotid bruit.  Normal pedal pulses.  Abdomen: Soft, nontender, no hepatosplenomegaly, no distention.  Neurologic: Alert and oriented x 3.  Psych: Normal affect. Extremities: No clubbing or cyanosis.   Assessment/Plan:  Coronary Artery Disease  No current ischemic symptoms. Continue ASA, Plavix, ACEI, beta blocker, statin.  I will continue plavix long-term. When he retires at the end of the year, I would like him to start walking for exercise.  He can stop Imdur.  Ischemic Cardiomyopathy  EF has returned to normal, 60% on 8/14 echo. Continue current doses of Coreg, lisinopril, and spironolactone. Will get a BMET today.  Hyperlipidemia Good lipids in 8/14.   [name] 08/09/2013

## 2013-09-20 ENCOUNTER — Encounter: Payer: Self-pay | Admitting: Internal Medicine

## 2013-10-05 ENCOUNTER — Other Ambulatory Visit: Payer: Self-pay | Admitting: *Deleted

## 2013-10-05 MED ORDER — CARVEDILOL 12.5 MG PO TABS: 12.5000 mg | ORAL_TABLET | Freq: Two times a day (BID) | ORAL | Status: DC

## 2013-10-05 MED ORDER — CLOPIDOGREL BISULFATE 75 MG PO TABS: 75.0000 mg | ORAL_TABLET | Freq: Every day | ORAL | Status: DC

## 2013-10-24 ENCOUNTER — Telehealth: Payer: Self-pay | Admitting: Internal Medicine

## 2013-10-24 MED ORDER — CITALOPRAM HYDROBROMIDE 20 MG PO TABS: 20.0000 mg | ORAL_TABLET | Freq: Every day | ORAL | Status: DC

## 2013-10-24 NOTE — Telephone Encounter (Signed)
Done . rx sent  

## 2013-10-24 NOTE — Telephone Encounter (Signed)
Patient's wife called requesting new rx for citalopram. They are now using [facility] mail order pharmacy.

## 2013-11-02 ENCOUNTER — Ambulatory Visit (INDEPENDENT_AMBULATORY_CARE_PROVIDER_SITE_OTHER): Payer: BC Managed Care – PPO | Admitting: Internal Medicine

## 2013-11-02 ENCOUNTER — Encounter: Payer: Self-pay | Admitting: Internal Medicine

## 2013-11-02 VITALS — BP 124/62 | HR 75 | Temp 98.0°F | Resp 12 | Wt 268.0 lb

## 2013-11-02 DIAGNOSIS — E118 Type 2 diabetes mellitus with unspecified complications: Secondary | ICD-10-CM

## 2013-11-02 LAB — HEMOGLOBIN A1C: HEMOGLOBIN A1C: 9.9 % — AB (ref 4.6–6.5)

## 2013-11-02 MED ORDER — GLIPIZIDE 10 MG PO TABS: 5.0000 mg | ORAL_TABLET | Freq: Two times a day (BID) | ORAL | Status: DC

## 2013-11-02 MED ORDER — CANAGLIFLOZIN 300 MG PO TABS: 300.0000 mg | ORAL_TABLET | Freq: Every day | ORAL | Status: DC

## 2013-11-02 MED ORDER — INSULIN GLARGINE 100 UNIT/ML ~~LOC~~ SOLN: 22.0000 [IU] | Freq: Every day | SUBCUTANEOUS | Status: DC

## 2013-11-02 MED ORDER — INSULIN PEN NEEDLE 31G X 5 MM MISC: Status: DC

## 2013-11-02 NOTE — Progress Notes (Signed)
Subjective:     Patient ID: Randy Castro, male   DOB: Aug 23, 1951, 63 y.o.   MRN: [id number]  HPI Mr. Gilmer is a pleasant 63 y.o. man returning for f/u for DM2, dx 1998, uncontrolled, non-insulin dependent, with complications (CAD, s/p MI 07/2011, s/p PTCA with DES, s/p recent PTCA for in-stent restenosis 12/15/12; also ED). He is here with his wife. Last visit was 3 mo ago.  He retired on 08/15/2013 >> less activity, increased weight.  Reviewed most recent HbA1c results: Lab Results  Component Value Date   HGBA1C 8.1* 08/03/2013   HGBA1C 8.8* 03/30/2013   HGBA1C 8.0* 11/03/2012   He is now on a regimen of: - Lantus 20 << 18 (22 << 20 if eats out) units at night - JanuMet 50/1000 bid >> stopped 2/2 diarrhea, fatigue >> restarted but with same SEs >> now taking one in am only >> same SEs - Glipizide 10 >> 5 mg bid   He checks his sugars approx 1/day - no log today - much worse after stopping Metformin-Januvia:  - am: 136-203 >> now 127-176 >> 126-181 >> 100-186 >> 217-239 - before lunch: 159-240 >> 102-170, one value at 84 >> 108 - 158 >> 90s-122 >> 230-250 - before dinner: 166-254 >> 93-214, mostly in the mid-100's >> 132-158 >> 72-122 >> 230-250 - bedtime: 152-279 >> 233 and 274 >> not checking now >> 104-148 >> 235-250 - nighttime: 3 lows 58-60s >> n/c No lows. He has hypoglycemia awareness at 59.  - no DR per last eye exam on 07/16/2012 >> will need to schedule a new one - Last ACR was 2.3 in 03/2011, normal BUN/Cr:  Lab Results  Component Value Date   BUN 14 08/09/2013   CREATININE 0.9 08/09/2013  He is on lisinopril. - No neuropathy per last foot exam 07/2012 - He has HL.  He is on Lipitor He is on ASA 81.  PMH:   Pt had a period off the meds for cost reasons and was restarted on DM meds in 03/2011. In 07/2011, he had a NSTEMI and had a DES placed. He was also dx with CHF, which is now improved (EF increased from 40-45% in 07/2011 to 55-60% in 10/2011). Sees Dr. Aundra Dubin with  Cardiology. He had CP/SOB for few months >> saw Dr. Percival Spanish on 12/15/3012 >> heart catheterization >> PTCA for LAD instent restenosis >> feels much better after this.   I reviewed pt's medications, allergies, PMH, social hx, family hx and no changes required, except as above.  Review of Systems Constitutional: no weight gain, no fatigue, no subjective hyperthermia/hypothermia; + poor sleep Eyes: no blurry vision, no xerophthalmia ENT: no sore throat, no nodules palpated in throat, no dysphagia/odynophagia, no hoarseness; + tinnitus Cardiovascular: no CP/no palpitations/leg swelling Respiratory: no cough/SOB Gastrointestinal: no N/V/D/C, + heartburn Musculoskeletal: muscle/joint aches Skin: no rashes; + easy bruising Neurological: no tremors/numbness/tingling/dizziness  Objective:   Physical Exam BP 124/62  Pulse 75  Temp(Src) 98 F (36.7 C) (Oral)  Resp 12  Wt 268 lb (121.564 kg)  SpO2 95% Wt Readings from Last 3 Encounters:  11/02/13 268 lb (121.564 kg)  08/09/13 263 lb (119.296 kg)  08/03/13 265 lb (120.203 kg)   Constitutional: overweight, in NAD Eyes: PERRLA, EOMI, no exophthalmos ENT: moist mucous membranes, no thyromegaly, no cervical lymphadenopathy Cardiovascular: RRR, No MRG Respiratory: CTA B Gastrointestinal: abdomen soft, NT, ND, BS+ Musculoskeletal: no deformities, strength intact in all 4 Skin: moist, warm, no rashes Neurological: no tremor  with outstretched hands, DTR normal in all 4   Assessment:     1. DM2, non-insulin dependent, uncontrolled, with complications: - CAD, s/p NSTEMI 07/2011, with PTCA + DES (Dr. Aundra Dubin), s/p PTCA for in-stent restenosis 12/15/2012- CHF, EF 55-60% per 2D ECHO in 10/2011 - ED - no DR - no nephropathy 2. Class II Obesity, Body mass index is 34.83 kg/(m^2).    Plan:     Patient's sugars are much worse after stopping the JanuMet and are still worse despite restarting half dose. He actually cannot tolerate this b/c of  fatigue and diarrhea. We discussed about options for tx. The cheapest alternative for him is Invokana (free for 12 mo with commercial insurance). - I advised him to: Patient Instructions  Continue same dose of Lantus and Glipizide. Start Invokana - 100 mg a day before b'fast for 1 week, then increase to 300 mg. Please stay hydrated while on Invokana. Please return in 1 month with your sugar log.  - we discussed about SEs of Invokana, which are: dizziness (advised to be careful when stands from sitting position), decreased BP - usually not < normal (BP today is not low), and fungal UTIs (advised to let me know if develops one).  - given discount card for Invokana and also samples - advised to let his cardiologist know we started it - advised him to restart walking and be active now that Spring is here - we will check a hemoglobin A1c today - patient will return in 1 month with his sugar log. We need to check his potassium then as he is also on Spironolactone although very low dose.  Office Visit on 11/02/2013  Component Date Value Ref Range Status  . Hemoglobin A1C 11/02/2013 9.9* 4.6 - 6.5 % Final   Glycemic Control Guidelines for People with Diabetes:Non Diabetic:  <6%Goal of Therapy: <7%Additional Action Suggested:  >8%    HbA1c increased, as expected >> please see plan above

## 2013-11-02 NOTE — Patient Instructions (Addendum)
Continue same dose of Lantus and Glipizide. Start Invokana - 100 mg a day before b'fast for 1 week, then increase to 300 mg. Please stay hydrated while on Invokana. Please return in 1 month with your sugar log.

## 2013-12-01 ENCOUNTER — Ambulatory Visit (INDEPENDENT_AMBULATORY_CARE_PROVIDER_SITE_OTHER): Payer: BC Managed Care – PPO | Admitting: Internal Medicine

## 2013-12-01 ENCOUNTER — Encounter: Payer: Self-pay | Admitting: Internal Medicine

## 2013-12-01 VITALS — BP 120/70 | HR 60 | Temp 97.9°F | Wt 260.0 lb

## 2013-12-01 DIAGNOSIS — F419 Anxiety disorder, unspecified: Secondary | ICD-10-CM

## 2013-12-01 DIAGNOSIS — E118 Type 2 diabetes mellitus with unspecified complications: Secondary | ICD-10-CM

## 2013-12-01 DIAGNOSIS — F329 Major depressive disorder, single episode, unspecified: Secondary | ICD-10-CM

## 2013-12-01 DIAGNOSIS — Z0000 Encounter for general adult medical examination without abnormal findings: Secondary | ICD-10-CM

## 2013-12-01 DIAGNOSIS — F341 Dysthymic disorder: Secondary | ICD-10-CM

## 2013-12-01 DIAGNOSIS — F32A Depression, unspecified: Secondary | ICD-10-CM

## 2013-12-01 NOTE — Progress Notes (Signed)
Pre visit review using our clinic review tool, if applicable. No additional management support is needed unless otherwise documented below in the visit note. 

## 2013-12-01 NOTE — Assessment & Plan Note (Signed)
Symptoms continue to be well-controlled, states he's feeling great. No change

## 2013-12-01 NOTE — Assessment & Plan Note (Signed)
Now under the care of endocrinology, working on getting his CBGs better

## 2013-12-01 NOTE — Progress Notes (Signed)
Subjective:    Patient ID: Randy Castro, male    DOB: Jul 08, 1951, 63 y.o.   MRN: [id number]  DOS:  12/01/2013 Type of  visit: ROV, here with his wife. Anxiety and depression, symptoms well-controlled, no apparent side effects from medications. CAD, saw cardiology, felt to be stable. Med list reviewed, taking all medicines as recommended. Labs reviewed, update on all routine labs   ROS Denies chest pain, difficulty breathing, lower extremity edema. Last week, twice, saw fresh blood on the toilet paper when he had a bowel movement, stools were somehow hard. Denies pain or itching in the anal area.   Past Medical History  Diagnosis Date  . Hyperlipidemia   . HTN (hypertension)   . CAD (coronary artery disease)     a. NSTEMI 12/12: EF 40-45%. GDJ:MEQASTM balloon PTCA + Promus DES x 1 to mid LAD;  b. 11/2012: Cath with Cutting balloon PTCA  LAD for ISR to LAD, EF 55%;  c. 12/2012 Cath/PCI: LAD stents patent, 80 ost Diag (jailed)->PTCA, LCX/RCA patent.  . Ischemic cardiomyopathy     a.  echo 08/06/11: dist ant wall, apical and septal and infero-apical HK, mild LVH, EF 40%, mild LAE, PASP 34, asc aorta mildly dilated, mild TR;  b.  Echo (3/13) showed recovery of LV systolic function with EF 19-62%, grade I diastolic dysfunction, mild MR.   Marland Kitchen Exertional shortness of breath   . Type II diabetes mellitus dx in the 90s  . Arthritis     "knees probably" (12/22/2012)    Past Surgical History  Procedure Laterality Date  . Anterior cervical decomp/discectomy fusion  in the 90s  . Coronary angioplasty with stent placement  07/2011    "1" (12/22/2012)  . Coronary angioplasty  12/15/2012; 12/22/2012    History   Social History  . Marital Status: Married    Spouse Name: N/A    Number of Children: 2   . Years of Education: N/A   Occupational History  . maintenance tech    Social History Main Topics  . Smoking status: Former Smoker -- 1.00 packs/day for 25 years    Types: Cigarettes    Quit  date: 04/12/1992  . Smokeless tobacco: Never Used  . Alcohol Use: Yes     Comment: 12/22/2012 "rarely maybe every 3-4 months, beer"  . Drug Use: No  . Sexual Activity: Yes   Other Topics Concern  . Not on file   Social History Narrative   Moved back to [place] from [place]        Medication List       This list is accurate as of: 12/01/13  5:49 PM.  Always use your most recent med list.               acetaminophen 325 MG tablet  Commonly known as:  TYLENOL  Take 650 mg by mouth every 6 (six) hours as needed for pain.     aspirin EC 81 MG tablet  Take 81 mg by mouth daily.     atorvastatin 80 MG tablet  Commonly known as:  LIPITOR  Take 1 tablet (80 mg total) by mouth daily.     Canagliflozin 300 MG Tabs  Commonly known as:  INVOKANA  Take 1 tablet (300 mg total) by mouth daily before breakfast.     carvedilol 12.5 MG tablet  Commonly known as:  COREG  Take 1 tablet (12.5 mg total) by mouth 2 (two) times daily with a meal.  citalopram 20 MG tablet  Commonly known as:  CELEXA  Take 1 tablet (20 mg total) by mouth daily.     clopidogrel 75 MG tablet  Commonly known as:  PLAVIX  Take 1 tablet (75 mg total) by mouth daily.     clotrimazole-betamethasone cream  Commonly known as:  LOTRISONE  Apply 1 application topically 2 (two) times daily.     famotidine 20 MG tablet  Commonly known as:  PEPCID  Take 20 mg by mouth 2 (two) times daily as needed for heartburn.     glipiZIDE 10 MG tablet  Commonly known as:  GLUCOTROL  Take 0.5 tablets (5 mg total) by mouth 2 (two) times daily before a meal.     glucose blood test strip  Use as instructed     insulin glargine 100 UNIT/ML injection  Commonly known as:  LANTUS  Inject 0.22 mLs (22 Units total) into the skin at bedtime.     Insulin Pen Needle 31G X 5 MM Misc  Commonly known as:  FIFTY50 PEN NEEDLES  Use once a day     lisinopril 10 MG tablet  Commonly known as:  PRINIVIL,ZESTRIL  Take 1 tablet (10 mg  total) by mouth 2 (two) times daily.     MAGNESIUM PO  Take 1 tablet by mouth daily.     multivitamin with minerals Tabs tablet  Take 1 tablet by mouth daily.     nitroGLYCERIN 0.4 MG SL tablet  Commonly known as:  NITROSTAT  Place 0.4 mg under the tongue every 5 (five) minutes as needed for chest pain.     spironolactone 25 MG tablet  Commonly known as:  ALDACTONE  TAKE 0.5 TABLETS (12.5 MG TOTAL) BY MOUTH DAILY.           Objective:   Physical Exam BP 120/70  Pulse 60  Temp(Src) 97.9 F (36.6 C)  Wt 260 lb (117.935 kg)  SpO2 95% General -- alert, well-developed, NAD.   Lungs -- normal respiratory effort, no intercostal retractions, no accessory muscle use, and normal breath sounds.  Heart-- normal rate, regular rhythm, no murmur.  Extremities-- no pretibial edema bilaterally  Neurologic--  alert & oriented X3. Speech normal, gait normal, strength normal in all extremities.   Psych-- Cognition and judgment appear intact. Cooperative with normal attention span and concentration. No anxious or depressed appearing.       Assessment & Plan:

## 2013-12-01 NOTE — Assessment & Plan Note (Signed)
Today complaining of blood per rectum twice, see history of present illness. Never had a colonoscopy, declined an anoscopy today. We talked about Cologuard,declined, his wife was unable to get d/t cost. They have the same insurance. Eventually agreed on IFOB

## 2013-12-01 NOTE — Patient Instructions (Signed)
   Next visit is for a physical exam by October 2015  , fasting Please make an appointment

## 2013-12-05 ENCOUNTER — Ambulatory Visit (INDEPENDENT_AMBULATORY_CARE_PROVIDER_SITE_OTHER): Payer: BC Managed Care – PPO | Admitting: Internal Medicine

## 2013-12-05 ENCOUNTER — Encounter: Payer: Self-pay | Admitting: Internal Medicine

## 2013-12-05 VITALS — BP 110/64 | HR 62 | Temp 97.9°F | Resp 12 | Wt 258.0 lb

## 2013-12-05 DIAGNOSIS — E118 Type 2 diabetes mellitus with unspecified complications: Secondary | ICD-10-CM

## 2013-12-05 LAB — BASIC METABOLIC PANEL
BUN: 17 mg/dL (ref 6–23)
CALCIUM: 9.5 mg/dL (ref 8.4–10.5)
CO2: 27 mEq/L (ref 19–32)
Chloride: 101 mEq/L (ref 96–112)
Creatinine, Ser: 0.9 mg/dL (ref 0.4–1.5)
GFR: 91.97 mL/min (ref >60.00)
Glucose, Bld: 277 mg/dL — ABNORMAL HIGH (ref 70–99)
Potassium: 4.1 mEq/L (ref 3.5–5.1)
SODIUM: 137 meq/L (ref 135–145)

## 2013-12-05 MED ORDER — INSULIN GLARGINE 100 UNIT/ML ~~LOC~~ SOLN: 35.0000 [IU] | Freq: Every day | SUBCUTANEOUS | Status: DC

## 2013-12-05 MED ORDER — GLIPIZIDE 10 MG PO TABS: 10.0000 mg | ORAL_TABLET | Freq: Two times a day (BID) | ORAL | Status: DC

## 2013-12-05 MED ORDER — INSULIN GLARGINE 100 UNIT/ML ~~LOC~~ SOLN: SUBCUTANEOUS | Status: DC

## 2013-12-05 MED ORDER — SITAGLIPTIN PHOSPHATE 100 MG PO TABS: 100.0000 mg | ORAL_TABLET | Freq: Every day | ORAL | Status: DC

## 2013-12-05 NOTE — Progress Notes (Signed)
Subjective:     Patient ID: Randy Castro, male   DOB: 1951/01/24, 63 y.o.   MRN: [id number]  HPI Randy Castro is a pleasant 63 y.o. man returning for f/u for DM2, dx 1998, uncontrolled, insulin dependent, with complications (CAD, s/p MI 07/2011, s/p PTCA with DES, s/p recent PTCA for in-stent restenosis 12/15/12; also ED). He is here with his wife. Last visit was 1 mo ago.  Reviewed most recent HbA1c results: Lab Results  Component Value Date   HGBA1C 9.9* 11/02/2013   HGBA1C 8.1* 08/03/2013   HGBA1C 8.8* 03/30/2013   He retired on 08/15/2013 >> less activity, increased weight. Sugars have greatly increased before last visit >> we added Invokana. He is now on a regimen of: - Lantus 22 << 20 << 18 units at night - Glipizide 10 >> 5 mg bid  - Invokana 100 >> 300 started 10/2013 - tolerates it well He was on JanuMet 50/1000 bid >> stopped 2/2 diarrhea, fatigue >> restarted but with same SEs >> stopped.  He checks his sugars approx 1/day - no log today - much worse at last visit, after stopping Metformin-Januvia:  - am: 136-203 >> now 127-176 >> 126-181 >> 100-186 >> 217-239 >> 213-273 - after b'fast: 367  - before lunch: 159-240 >> 102-170, one value at 84 >> 108 - 158 >> 90s-122 >> 230-250 >> 272-408 - before dinner: 166-254 >> 93-214, mostly in the mid-100's >> 132-158 >> 72-122 >> 230-250 >> 272-338 - bedtime: 152-279 >> 233 and 274 >> not checking now >> 104-148 >> 235-250 >> 282 - nighttime: 3 lows 58-60s >> n/c No lows. He has hypoglycemia awareness at 68.  - no DR per last eye exam on 07/16/2012 - Last ACR was 2.3 in 03/2011, normal BUN/Cr:  Lab Results  Component Value Date   BUN 14 08/09/2013   CREATININE 0.9 08/09/2013  He is on lisinopril. - No neuropathy per last foot exam 07/2012 - He has HL.  He is on Lipitor He is on ASA 81.  PMH:   Pt had a period off the meds for cost reasons and was restarted on DM meds in 03/2011. In 07/2011, he had a NSTEMI and had a DES placed. He  was also dx with CHF, which is now improved (EF increased from 40-45% in 07/2011 to 55-60% in 10/2011). Sees Dr. Aundra Dubin with Cardiology. He had CP/SOB for few months >> saw Dr. Percival Spanish on 12/15/3012 >> heart catheterization >> PTCA for LAD instent restenosis >> feels much better after this.   I reviewed pt's medications, allergies, PMH, social hx, family hx and no changes required, except as above.  Review of Systems Constitutional: no weight gain, no fatigue, no subjective hyperthermia/hypothermia; + nocturia Eyes: no blurry vision, no xerophthalmia ENT: no sore throat, no nodules palpated in throat, no dysphagia/odynophagia, no hoarseness Cardiovascular: no CP/no palpitations/leg swelling Respiratory: no cough/SOB Gastrointestinal: no N/V/D/C Musculoskeletal: muscle/joint aches Skin: no rashes; + easy bruising Neurological: no tremors/numbness/tingling/dizziness  Objective:   Physical Exam BP 110/64  Pulse 62  Temp(Src) 97.9 F (36.6 C) (Oral)  Resp 12  Wt 258 lb (117.028 kg)  SpO2 95% Wt Readings from Last 3 Encounters:  12/05/13 258 lb (117.028 kg)  12/01/13 260 lb (117.935 kg)  11/02/13 268 lb (121.564 kg)   Constitutional: overweight, in NAD Eyes: PERRLA, EOMI, no exophthalmos ENT: moist mucous membranes, no thyromegaly, no cervical lymphadenopathy Cardiovascular: RRR, No MRG Respiratory: CTA B Gastrointestinal: abdomen soft, NT, ND, BS+ Musculoskeletal: no  deformities, strength intact in all 4 Skin: moist, warm, no rashes Neurological: no tremor with outstretched hands, DTR normal in all 4   Assessment:     1. DM2, insulin dependent, uncontrolled, with complications: - CAD, s/p NSTEMI 07/2011, with PTCA + DES (Dr. Aundra Dubin), s/p PTCA for in-stent restenosis 12/15/2012- CHF, EF 55-60% per 2D ECHO in 10/2011 - ED - no DR - no nephropathy    Plan:     Patient's sugars were much worse after stopping the JanuMet, still worse now after adding Invokana despite losing  10 lbs since last visit! - I advised him to: Patient Instructions  Please increase Lantus to 35 units at night. Start Januvia 100 mg in am. Continue Invokana 300 mg in am. Increase Glipizide to 10 mg 2x a day. Please return in 1 month with your sugar log.  Please stop at the lab. - we will check a hemoglobin A1c at next visit - Will check his potassium then as he is also on Lisinopril and Spironolactone - need a foot exam at next visit - strongly advised him for an eye exam >> he is scheduling a new one - return in 1 month with his sugar log.   Office Visit on 12/05/2013  Component Date Value Ref Range Status  . Sodium 12/05/2013 137  135 - 145 mEq/L Final  . Potassium 12/05/2013 4.1  3.5 - 5.1 mEq/L Final  . Chloride 12/05/2013 101  96 - 112 mEq/L Final  . CO2 12/05/2013 27  19 - 32 mEq/L Final  . Glucose, Bld 12/05/2013 277* 70 - 99 mg/dL Final  . BUN 12/05/2013 17  6 - 23 mg/dL Final  . Creatinine, Ser 12/05/2013 0.9  0.4 - 1.5 mg/dL Final  . Calcium 12/05/2013 9.5  8.4 - 10.5 mg/dL Final  . GFR 12/05/2013 91.97  >60.00 mL/min Final   Msg sent: Dear Randy Castro, The sugar is still very high, but the potassium and kidney function are good. Please continue the diabetes regimen as per our last discussion. Sincerely, Philemon Kingdom MD

## 2013-12-05 NOTE — Patient Instructions (Signed)
Please increase Lantus to 35 units at night. Start Januvia 100 mg in am. Continue Invokana 300 mg in am. Increase Glipizide to 10 mg 2x a day.  Please return in 1 month with your sugar log.   Please stop at the lab.

## 2013-12-15 ENCOUNTER — Telehealth: Payer: Self-pay

## 2013-12-15 NOTE — Telephone Encounter (Signed)
Relevant patient education assigned to patient using Emmi. ° °

## 2013-12-18 ENCOUNTER — Other Ambulatory Visit: Payer: Self-pay | Admitting: Cardiology

## 2013-12-24 ENCOUNTER — Other Ambulatory Visit: Payer: Self-pay | Admitting: Internal Medicine

## 2013-12-27 ENCOUNTER — Other Ambulatory Visit: Payer: Self-pay | Admitting: Internal Medicine

## 2014-01-01 ENCOUNTER — Ambulatory Visit (INDEPENDENT_AMBULATORY_CARE_PROVIDER_SITE_OTHER): Payer: BC Managed Care – PPO | Admitting: Internal Medicine

## 2014-01-01 ENCOUNTER — Other Ambulatory Visit: Payer: Self-pay | Admitting: *Deleted

## 2014-01-01 ENCOUNTER — Encounter: Payer: Self-pay | Admitting: Internal Medicine

## 2014-01-01 VITALS — BP 136/72 | HR 68 | Temp 98.1°F | Resp 16 | Ht 74.0 in | Wt 262.0 lb

## 2014-01-01 DIAGNOSIS — E118 Type 2 diabetes mellitus with unspecified complications: Secondary | ICD-10-CM

## 2014-01-01 MED ORDER — INSULIN GLARGINE 100 UNIT/ML SOLOSTAR PEN: 35.0000 [IU] | PEN_INJECTOR | Freq: Every day | SUBCUTANEOUS | Status: DC

## 2014-01-01 MED ORDER — EXENATIDE ER 2 MG ~~LOC~~ SUSR: 2.0000 mg | SUBCUTANEOUS | Status: DC

## 2014-01-01 NOTE — Progress Notes (Signed)
Subjective:     Patient ID: Randy Castro, male   DOB: 1951/06/23, 63 y.o.   MRN: [id number]  HPI Mr. Bee is a pleasant 63 y.o. man returning for f/u for DM2, dx 1998, uncontrolled, insulin dependent, with complications (CAD, s/p MI 07/2011, s/p PTCA with DES, s/p recent PTCA for in-stent restenosis 12/15/12; also ED). He is here with his wife. Last visit was 1 mo ago.  Reviewed most recent HbA1c results - A1c greatly worsened after having to stop his Metformin: Lab Results  Component Value Date   HGBA1C 9.9* 11/02/2013   HGBA1C 8.1* 08/03/2013   HGBA1C 8.8* 03/30/2013   He retired on 08/15/2013 >> less activity, increased weight. Sugars have greatly increased before last visit >> we added Invokana.  He is now on a regimen of: - Lantus 35 units at night - Glipizide 10 mg bid  - Invokana 300 mg - tolerates it well - Januvia 100 - started 11/2013 He was on JanuMet 50/1000 bid >> stopped 2/2 diarrhea, fatigue >> restarted but with same SEs >> stopped.  He checks his sugars approx 1-2/day - improved:  - am: 136-203 >> now 127-176 >> 126-181 >> 100-186 >> 217-239 >> 213-273 >>143-200 - after b'fast: 367 >> 158 - before lunch: 159-240 >> 102-170, one value at 84 >> 108 - 158 >> 90s-122 >> 230-250 >> 272-408 >> 174, 235-250 - before dinner: 166-254 >> 93-214, mostly in the mid-100's >> 132-158 >> 72-122 >> 230-250 >> 272-338 >> 227 - bedtime: 152-279 >> 233 and 274 >> not checking now >> 104-148 >> 235-250 >> 282 >> n/c - nighttime: 3 lows 58-60s >> n/c No lows. He has hypoglycemia awareness at 11.  - no DR per last eye exam on 07/16/2012  - No CKD. Last BUN/Cr:  Lab Results  Component Value Date   BUN 17 12/05/2013   CREATININE 0.9 12/05/2013  He is on lisinopril. - No neuropathy per foot exam 01/01/2014. - He has HL.  He is on Lipitor He is on ASA 81.  PMH:   Pt had a period off the meds for cost reasons and was restarted on DM meds in 03/2011. In 07/2011, he had a NSTEMI and had a  DES placed. He was also dx with CHF, which is now improved (EF increased from 40-45% in 07/2011 to 55-60% in 10/2011). Sees Dr. Aundra Dubin with Cardiology. He had CP/SOB for few months >> saw Dr. Percival Spanish on 12/15/3012 >> heart catheterization >> PTCA for LAD instent restenosis >> feels much better after this.   I reviewed pt's medications, allergies, PMH, social hx, family hx and no changes required, except as above.  Review of Systems Constitutional: no weight gain, no fatigue, no subjective hyperthermia/hypothermia; + nocturia Eyes: no blurry vision, no xerophthalmia ENT: no sore throat, no nodules palpated in throat, no dysphagia/odynophagia, no hoarseness Cardiovascular: no CP/no palpitations/leg swelling Respiratory: no cough/SOB Gastrointestinal: no N/V/D/C Musculoskeletal: muscle/joint aches Skin: no rashes; + easy bruising Neurological: no tremors/numbness/tingling/dizziness  Objective:   Physical Exam BP 136/72  Pulse 68  Temp(Src) 98.1 F (36.7 C)  Resp 16  Ht 6\' 2"  (1.88 m)  Wt 262 lb (118.842 kg)  BMI 33.62 kg/m2  SpO2 95% Wt Readings from Last 3 Encounters:  01/01/14 262 lb (118.842 kg)  12/05/13 258 lb (117.028 kg)  12/01/13 260 lb (117.935 kg)   Constitutional: overweight, in NAD Eyes: PERRLA, EOMI, no exophthalmos ENT: moist mucous membranes, no thyromegaly, no cervical lymphadenopathy Cardiovascular: RRR, No MRG  Respiratory: CTA B Gastrointestinal: abdomen soft, NT, ND, BS+ Musculoskeletal: no deformities, strength intact in all 4 Skin: moist, warm, no rashes Neurological: no tremor with outstretched hands, DTR normal in all 4 Foot exam normal today Assessment:     1. DM2, insulin dependent, uncontrolled, with complications: - CAD, s/p NSTEMI 07/2011, with PTCA + DES (Dr. Aundra Dubin), s/p PTCA for in-stent restenosis 12/15/2012- CHF, EF 55-60% per 2D ECHO in 10/2011 - ED - no DR - no nephropathy    Plan:     Patient's sugars were much worse after stopping  the JanuMet, still worse now after adding Invokana despite losing 10 lbs since last visit! - I advised him to: Patient Instructions  Please return in 1 month with your sugar log (after 02/02/2014). Please continue: - Lantus 35 units at night  - Glipizide 10 mg 2x a day - Invokana 300 mg in am Stop Januvia and start Bydureon 2 mg once a week (on Sundays). Please work on your diet and exercise. Separate meals by at least 5 hours. Do not skip meals. - refilled Lantus (did not get last Rx) - we will check a hemoglobin A1c and potassium at next visit (as he is on Invokana and also on Lisinopril and Spironolactone) - return in 1 month with his sugar log.

## 2014-01-01 NOTE — Patient Instructions (Addendum)
Please return in 1 month with your sugar log (after 02/02/2014).  Please continue: - Lantus 35 units at night  - Glipizide 10 mg 2x a day - Invokana 300 mg in am Stop Januvia and start Bydureon 2 mg once a week (on Sundays).  Please work on your diet and exercise.  Separate meals by at least 5 hours. Do not skip meals.  Please consider the following ways to cut down carbs and fat and increase fiber and micronutrients in your diet: - substitute whole grain for white bread or pasta - substitute Elamin rice for white rice - substitute 90-calorie flat bread pieces for slices of bread when possible - substitute sweet potatoes or yams for white potatoes - substitute humus for margarine - substitute tofu for cheese when possible - substitute almond or rice milk for regular milk (would not drink soy milk daily due to concern for soy estrogen influence on breast cancer risk) - substitute dark chocolate for other sweets when possible - substitute water - can add lemon or orange slices for taste - for diet sodas (artificial sweeteners will trick your body that you can eat sweets without getting calories and will lead you to overeating and weight gain in the long run) - do not skip breakfast or other meals (this will slow down the metabolism and will result in more weight gain over time)  - can try smoothies made from fruit and almond/rice milk in am instead of regular breakfast - can also try old-fashioned (not instant) oatmeal made with almond/rice milk in am - order the dressing on the side when eating salad at a restaurant (pour less than half of the dressing on the salad) - eat as little meat as possible - can try juicing, but should not forget that juicing will get rid of the fiber, so would alternate with eating raw veg./fruits or drinking smoothies - use as little oil as possible, even when using olive oil - can dress a salad with a mix of balsamic vinegar and lemon juice, for e.g. - use  agave nectar, stevia sugar, or regular sugar rather than artificial sweateners - steam or broil/roast veggies  - snack on veggies/fruit/nuts (unsalted, preferably) when possible, rather than processed foods - reduce or eliminate aspartame in diet (it is in diet sodas, chewing gum, etc) Read the labels!  Try to read Dr. Janene Harvey book: "Program for Reversing Diabetes" for the vegan concept and other ideas for healthy eating.

## 2014-01-03 ENCOUNTER — Other Ambulatory Visit: Payer: Self-pay | Admitting: Cardiology

## 2014-01-17 ENCOUNTER — Other Ambulatory Visit: Payer: Self-pay | Admitting: *Deleted

## 2014-01-17 ENCOUNTER — Telehealth: Payer: Self-pay | Admitting: *Deleted

## 2014-01-17 ENCOUNTER — Encounter: Payer: Self-pay | Admitting: Internal Medicine

## 2014-01-17 MED ORDER — EXENATIDE ER 2 MG ~~LOC~~ SUSR: 2.0000 mg | SUBCUTANEOUS | Status: DC

## 2014-01-17 NOTE — Telephone Encounter (Signed)
MyChart message from pt:  [facility] been unable to get the By[facility]on. Could you send a new prescription for this to the [facility] in [place], please let me know. I will also change my appointment if you want me to since I have not been able to get the Bydureon. Thanks, Randy Castro  I sent the Bydureon to [facility] in [place]. Do I need to reschedule his appt? Please advise.

## 2014-01-17 NOTE — Telephone Encounter (Signed)
Let me see him 1 mo - 1.5 mo after he starts Bydureon.

## 2014-01-19 ENCOUNTER — Telehealth: Payer: Self-pay | Admitting: Internal Medicine

## 2014-01-19 ENCOUNTER — Other Ambulatory Visit: Payer: Self-pay

## 2014-01-19 DIAGNOSIS — I2510 Atherosclerotic heart disease of native coronary artery without angina pectoris: Secondary | ICD-10-CM

## 2014-01-19 DIAGNOSIS — E785 Hyperlipidemia, unspecified: Secondary | ICD-10-CM

## 2014-01-19 DIAGNOSIS — I10 Essential (primary) hypertension: Secondary | ICD-10-CM

## 2014-01-19 DIAGNOSIS — I2589 Other forms of chronic ischemic heart disease: Secondary | ICD-10-CM

## 2014-01-19 MED ORDER — ATORVASTATIN CALCIUM 80 MG PO TABS: 80.0000 mg | ORAL_TABLET | Freq: Every day | ORAL | Status: DC

## 2014-01-19 NOTE — Telephone Encounter (Signed)
Caller name:linda Relation to CW:UGQB Call back number:[phone] Pharmacy: [facility]  Reason for call:  Pt needs refill on RX lisinopril (PRINIVIL,ZESTRIL) 10 MG . Please send to pharmacy listed above

## 2014-01-22 MED ORDER — LISINOPRIL 10 MG PO TABS
10.0000 mg | ORAL_TABLET | Freq: Two times a day (BID) | ORAL | Status: DC
Start: 2014-01-22 — End: 2014-06-11

## 2014-01-22 NOTE — Telephone Encounter (Signed)
Refill for lisinopril sent to [facility]

## 2014-02-05 ENCOUNTER — Ambulatory Visit: Payer: BC Managed Care – PPO | Admitting: Internal Medicine

## 2014-02-19 ENCOUNTER — Ambulatory Visit: Payer: BC Managed Care – PPO | Admitting: Internal Medicine

## 2014-03-06 ENCOUNTER — Ambulatory Visit (INDEPENDENT_AMBULATORY_CARE_PROVIDER_SITE_OTHER): Payer: BC Managed Care – PPO | Admitting: Internal Medicine

## 2014-03-06 ENCOUNTER — Encounter: Payer: Self-pay | Admitting: Internal Medicine

## 2014-03-06 VITALS — BP 128/60 | HR 71 | Temp 97.6°F | Resp 12 | Wt 261.0 lb

## 2014-03-06 DIAGNOSIS — E118 Type 2 diabetes mellitus with unspecified complications: Secondary | ICD-10-CM

## 2014-03-06 LAB — HEMOGLOBIN A1C: Hgb A1c MFr Bld: 9.8 % — ABNORMAL HIGH (ref 4.6–6.5)

## 2014-03-06 LAB — BASIC METABOLIC PANEL
BUN: 15 mg/dL (ref 6–23)
CALCIUM: 9.4 mg/dL (ref 8.4–10.5)
CO2: 24 mEq/L (ref 19–32)
Chloride: 104 mEq/L (ref 96–112)
Creatinine, Ser: 0.9 mg/dL (ref 0.4–1.5)
GFR: 94.34 mL/min (ref >60.00)
GLUCOSE: 246 mg/dL — AB (ref 70–99)
POTASSIUM: 3.7 meq/L (ref 3.5–5.1)
SODIUM: 136 meq/L (ref 135–145)

## 2014-03-06 MED ORDER — INSULIN ASPART 100 UNIT/ML FLEXPEN: 6.0000 [IU] | PEN_INJECTOR | Freq: Three times a day (TID) | SUBCUTANEOUS | Status: DC

## 2014-03-06 NOTE — Patient Instructions (Signed)
Please continue: - Lantus 35 units at bedtime - Invokana 300 mg daily in am - Bydureon 2 mg weekly, until you run out Stop Glipizide. - start NovoLog:  6 units for a smaller meal 8 units for a regular meal 10 units for a larger meal - Please inject 10-15 min before a meal  Please return in 1 month with your sugar log.   Please stop at the lab.

## 2014-03-06 NOTE — Progress Notes (Signed)
Subjective:     Patient ID: Randy Castro, male   DOB: 27-Jan-1951, 63 y.o.   MRN: [id number]  HPI Mr. Holberg is a pleasant 63 y.o. man returning for f/u for DM2, dx 1998, uncontrolled, insulin dependent, with complications (CAD, s/p MI 07/2011, s/p PTCA with DES, s/p recent PTCA for in-stent restenosis 12/15/12; also ED). He is here with his wife. Last visit was 2 mo ago.  Reviewed most recent HbA1c results - A1c greatly worsened after having to stop his Metformin: Lab Results  Component Value Date   HGBA1C 9.9* 11/02/2013   HGBA1C 8.1* 08/03/2013   HGBA1C 8.8* 03/30/2013   He retired on 08/15/2013 >> less activity, increased weight. Sugars have greatly increased >> we added Invokana.  He is now on a regimen of: - Lantus 35 units at night - Glipizide 10 mg bid  - Invokana 300 mg - tolerates it well - Bydureon 2 mg weekly - started 12/2013. We stopped Januvia 100 in 12/2013. He was on JanuMet 50/1000 bid >> stopped 2/2 diarrhea, fatigue >> restarted but with same SEs >> stopped.  He checks his sugars approx 1-2/day - still high,   - am: 127-176 >> 126-181 >> 100-186 >> 217-239 >> 213-273 >>143-200 >> 142-200 (260) - after b'fast: 367 >> 158 >> n/c - before lunch: 108 - 158 >> 90s-122 >> 230-250 >> 272-408 >> 174, 235-250 >> 197-266 (354) - 2h after lunch: 143 - before dinner: 132-158 >> 72-122 >> 230-250 >> 272-338 >> 227 >> 161-280 - bedtime: 233 and 274 >> n/c >> 104-148 >> 235-250 >> 282 >> n/c >> 156-311 - nighttime: 3 lows 58-60s >> n/c No lows. He has hypoglycemia awareness at 9.  - no DR per last eye exam on 07/16/2012 >> will set set another one tomorrow - No CKD. Last BUN/Cr:  Lab Results  Component Value Date   BUN 17 12/05/2013   CREATININE 0.9 12/05/2013  He is on lisinopril. - No neuropathy per foot exam 01/01/2014. - He has HL: Lab Results  Component Value Date   CHOL 117 04/12/2013   HDL 32.60* 04/12/2013   LDLCALC 43 11/28/2012   LDLDIRECT 59.2 04/12/2013   TRIG  269.0* 04/12/2013   CHOLHDL 4 04/12/2013  He is on Lipitor He is on ASA 81. Foot exam normal 12/2013.  PMH:   Pt had a period off the meds for cost reasons and was restarted on DM meds in 03/2011. In 07/2011, he had a NSTEMI and had a DES placed. He was also dx with CHF, which is now improved (EF increased from 40-45% in 07/2011 to 55-60% in 10/2011). Sees Dr. Aundra Dubin with Cardiology. He had CP/SOB for few months >> saw Dr. Percival Spanish on 12/15/3012 >> heart catheterization >> PTCA for LAD instent restenosis >> feels much better after this.   I reviewed pt's medications, allergies, PMH, social hx, family hx and no changes required, except as above.  Review of Systems Constitutional: no weight gain, no fatigue, no subjective hyperthermia/hypothermia; + nocturia Eyes: no blurry vision, no xerophthalmia ENT: no sore throat, no nodules palpated in throat, no dysphagia/odynophagia, no hoarseness Cardiovascular: no CP/no palpitations/leg swelling Respiratory: no cough/SOB Gastrointestinal: no N/V/D/C/+ hearyburn Musculoskeletal: muscle/joint aches Skin: no rashes; + easy bruising Neurological: no tremors/numbness/tingling/dizziness  Objective:   Physical Exam BP 128/60  Pulse 71  Temp(Src) 97.6 F (36.4 C) (Oral)  Resp 12  Wt 261 lb (118.389 kg) Wt Readings from Last 3 Encounters:  03/06/14 261 lb (118.389 kg)  01/01/14 262 lb (118.842 kg)  12/05/13 258 lb (117.028 kg)   Constitutional: overweight, in NAD Eyes: PERRLA, EOMI, no exophthalmos ENT: moist mucous membranes, no thyromegaly, no cervical lymphadenopathy Cardiovascular: RRR, No MRG Respiratory: CTA B Gastrointestinal: abdomen soft, NT, ND, BS+ Musculoskeletal: no deformities, strength intact in all 4 Skin: moist, warm, no rashes Neurological: no tremor with outstretched hands, DTR normal in all 4 Assessment:     1. DM2, insulin dependent, uncontrolled, with complications: - CAD, s/p NSTEMI 07/2011, with PTCA + DES (Dr.  Aundra Dubin), s/p PTCA for in-stent restenosis 12/15/2012- CHF, EF 55-60% per 2D ECHO in 10/2011 - ED - no DR - no nephropathy    Plan:     Patient's sugars still not improved despite Invokana and Bydureon >> will need Mealtime insulin - I advised him to: Patient Instructions  Please continue: - Lantus 35 units at bedtime - Invokana 300 mg daily in am - Bydureon 2 mg weekly, until you run out Stop Glipizide. - start NovoLog:  6 units for a smaller meal 8 units for a regular meal 10 units for a larger meal - Please inject 10-15 min before a meal Please return in 1 month with your sugar log.  Please stop at the lab. - we will check a hemoglobin A1c and potassium level (as he is on Invokana and also on Lisinopril and Spironolactone) - return in 1 month with his sugar log.   Office Visit on 03/06/2014  Component Date Value Ref Range Status  . Hemoglobin A1C 03/06/2014 9.8* 4.6 - 6.5 % Final   Glycemic Control Guidelines for People with Diabetes:Non Diabetic:  <6%Goal of Therapy: <7%Additional Action Suggested:  >8%   . Sodium 03/06/2014 136  135 - 145 mEq/L Final  . Potassium 03/06/2014 3.7  3.5 - 5.1 mEq/L Final  . Chloride 03/06/2014 104  96 - 112 mEq/L Final  . CO2 03/06/2014 24  19 - 32 mEq/L Final  . Glucose, Bld 03/06/2014 246* 70 - 99 mg/dL Final  . BUN 03/06/2014 15  6 - 23 mg/dL Final  . Creatinine, Ser 03/06/2014 0.9  0.4 - 1.5 mg/dL Final  . Calcium 03/06/2014 9.4  8.4 - 10.5 mg/dL Final  . GFR 03/06/2014 94.34  >60.00 mL/min Final   Msg sent: Dear Mr Avina, HbA1c still high, as expected >> let's see how the mealtime insulin works. The potassium and kidney function are great! Sincerely, Philemon Kingdom MD

## 2014-03-07 ENCOUNTER — Telehealth: Payer: Self-pay | Admitting: Internal Medicine

## 2014-03-07 ENCOUNTER — Other Ambulatory Visit: Payer: Self-pay | Admitting: *Deleted

## 2014-03-07 MED ORDER — INSULIN PEN NEEDLE 31G X 5 MM MISC: Status: DC

## 2014-03-07 NOTE — Telephone Encounter (Signed)
Pt needs [place] to call in pen needles for [facility]

## 2014-03-07 NOTE — Telephone Encounter (Signed)
Done

## 2014-04-04 LAB — HM DIABETES EYE EXAM

## 2014-04-09 ENCOUNTER — Other Ambulatory Visit: Payer: Self-pay | Admitting: Cardiology

## 2014-04-09 ENCOUNTER — Other Ambulatory Visit: Payer: Self-pay | Admitting: Internal Medicine

## 2014-04-17 ENCOUNTER — Ambulatory Visit (INDEPENDENT_AMBULATORY_CARE_PROVIDER_SITE_OTHER): Payer: BC Managed Care – PPO | Admitting: Internal Medicine

## 2014-04-17 ENCOUNTER — Encounter: Payer: Self-pay | Admitting: Internal Medicine

## 2014-04-17 VITALS — BP 112/68 | HR 84 | Temp 97.4°F | Resp 12 | Wt 259.0 lb

## 2014-04-17 DIAGNOSIS — E118 Type 2 diabetes mellitus with unspecified complications: Secondary | ICD-10-CM

## 2014-04-17 MED ORDER — INSULIN ASPART 100 UNIT/ML FLEXPEN: 10.0000 [IU] | PEN_INJECTOR | Freq: Three times a day (TID) | SUBCUTANEOUS | Status: DC

## 2014-04-17 MED ORDER — INSULIN GLARGINE 100 UNIT/ML SOLOSTAR PEN: 40.0000 [IU] | PEN_INJECTOR | Freq: Every day | SUBCUTANEOUS | Status: DC

## 2014-04-17 NOTE — Patient Instructions (Signed)
Continue Invokana 300 mg daily in am Please increase Lantus to 40 units at night Increase mealtime NovoLog 10 units for a smaller meal 12 units for a regular meal 14 units for a larger meal Please limit snacking between meals or after dinner.  Try to replace snacking on these with drinking/eating: * soy or almond milk * veggies with humus or other low calorie/low fat dip * low glycemic index fruits (higher glycemic index = higher risk to increase your sugars):          http://[email or web address]://flores-[email or web address] * fruit/veggie smoothies          Ninja blender recipes:          https://www.moore-west.com/ * unsalted nuts Etc.  Please return in 1 month with your sugar log.

## 2014-04-17 NOTE — Progress Notes (Signed)
Subjective:     Patient ID: Randy Castro, male   DOB: 1950-12-03, 63 y.o.   MRN: [id number]  HPI Randy Castro is a pleasant 63 y.o. man returning for f/u for DM2, dx 1998, uncontrolled, insulin dependent, with complications (CAD, s/p MI 07/2011, s/p PTCA with DES, s/p recent PTCA for in-stent restenosis 12/15/12; also ED). He is here with his wife. Last visit was 1.5 mo ago.  Reviewed HbA1c levels: greatly worsened after having to stop his Metformin. Lab Results  Component Value Date   HGBA1C 9.8* 03/06/2014   HGBA1C 9.9* 11/02/2013   HGBA1C 8.1* 08/03/2013   He is now on a regimen of: - Lantus 35 units at night - Invokana 300 mg - tolerates it well - mealtime NovoLog - started 02/2014: 6 units for a smaller meal 8 units for a regular meal 10 units for a larger meal We stopped Glipizide when we started mealtime insulin. We stopped Bydureon when we started mealtime insulin We stopped Januvia 100 in 12/2013. He was on JanuMet 50/1000 bid >> stopped 2/2 diarrhea, fatigue >> restarted but with same SEs >> stopped. He retired on 08/15/2013 >> less activity, increased weight. Sugars have greatly increased >> we added Invokana.  He checks his sugars approx 1-2/day - still high, as he continues to eat out 5x a week and overindulge in sweets.  - am: 127-176 >> 126-181 >> 100-186 >> 217-239 >> 213-273 >>143-200 >> 142-200 (260) >> 170-281 - after b'fast: 367 >> 158 >> n/c >> 231, 315 - before lunch: 108 - 158 >> 90s-122 >> 230-250 >> 272-408 >> 174, 235-250 >> 197-266 (354) >> 166-334 - 2h after lunch: 143 >> n/c - before dinner: 132-158 >> 72-122 >> 230-250 >> 272-338 >> 227 >> 161-280 >> 152-248 (348 x1) - bedtime: 233 and 274 >> n/c >> 104-148 >> 235-250 >> 282 >> n/c >> 156-311 >> 209, 254, 289 - nighttime: 3 lows 58-60s >> n/c No lows. He has hypoglycemia awareness at 39.  - no DR per last eye exam on 04/04/2014 >> no DR. - No CKD. Last BUN/Cr:  Lab Results  Component Value Date   BUN 15  03/06/2014   CREATININE 0.9 03/06/2014  He is on lisinopril. - No neuropathy per foot exam 01/01/2014. - He has HL: Lab Results  Component Value Date   CHOL 117 04/12/2013   HDL 32.60* 04/12/2013   LDLCALC 43 11/28/2012   LDLDIRECT 59.2 04/12/2013   TRIG 269.0* 04/12/2013   CHOLHDL 4 04/12/2013  He is on Lipitor He is on ASA 81. Foot exam normal 12/2013.  PMH:   Pt had a period off the meds for cost reasons and was restarted on DM meds in 03/2011. In 07/2011, he had a NSTEMI and had a DES placed. He was also dx with CHF, which is now improved (EF increased from 40-45% in 07/2011 to 55-60% in 10/2011). Sees Dr. Aundra Dubin with Cardiology. He had CP/SOB for few months >> saw Dr. Percival Spanish on 12/15/3012 >> heart catheterization >> PTCA for LAD instent restenosis >> feels much better after this.   I reviewed pt's medications, allergies, PMH, social hx, family hx and no changes required, except as above.  Review of Systems Constitutional: no weight gain, no fatigue, no subjective hyperthermia/hypothermia Eyes: no blurry vision, no xerophthalmia ENT: no sore throat, no nodules palpated in throat, no dysphagia/odynophagia, no hoarseness Cardiovascular: no CP/no palpitations/leg swelling Respiratory: no cough/SOB Gastrointestinal: no N/V/D/C/+ heartburn Musculoskeletal: muscle/joint aches Skin: no rashes Neurological:  no tremors/numbness/tingling/dizziness  Objective:   Physical Exam BP 112/68  Pulse 84  Temp(Src) 97.4 F (36.3 C) (Oral)  Resp 12  Wt 259 lb (117.482 kg)  SpO2 95% Wt Readings from Last 3 Encounters:  04/17/14 259 lb (117.482 kg)  03/06/14 261 lb (118.389 kg)  01/01/14 262 lb (118.842 kg)   Constitutional: overweight, in NAD Eyes: PERRLA, EOMI, no exophthalmos ENT: moist mucous membranes, no thyromegaly, no cervical lymphadenopathy Cardiovascular: RRR, No MRG Respiratory: CTA B Gastrointestinal: abdomen soft, NT, ND, BS+ Musculoskeletal: no deformities, strength intact  in all 4 Skin: moist, warm, no rashes Neurological: no tremor with outstretched hands, DTR normal in all 4 Assessment:     1. DM2, insulin dependent, uncontrolled, with complications: - CAD, s/p NSTEMI 07/2011, with PTCA + DES (Dr. Aundra Dubin), s/p PTCA for in-stent restenosis 12/15/2012- CHF, EF 55-60% per 2D ECHO in 10/2011 - ED - no DR - no nephropathy    Plan:     Patient's sugars still high after adding Mealtime insulin >> will increase insulin doses and discussed at length about the need to change his diet >> given specific examples. I also advised him to join a gym - they have 2 gyms closed to their house.  - I advised him to: Patient Instructions  Continue Invokana 300 mg daily in am Please increase Lantus to 40 units at night Increase mealtime NovoLog 10 units for a smaller meal 12 units for a regular meal 14 units for a larger meal Please limit snacking between meals or after dinner.  Try to replace snacking on these with drinking/eating: * soy or almond milk * veggies with humus or other low calorie/low fat dip * low glycemic index fruits (higher glycemic index = higher risk to increase your sugars):          http://[email or web address]://[email or web address] * fruit/veggie smoothies          Ninja blender recipes:          https://www.moore-west.com/ * unsalted nuts Etc.  Please return in 1 month with your sugar log.   - no labs needed - return in 1 month with his sugar log.

## 2014-04-19 ENCOUNTER — Other Ambulatory Visit: Payer: Self-pay | Admitting: Internal Medicine

## 2014-05-02 ENCOUNTER — Other Ambulatory Visit: Payer: Self-pay | Admitting: Cardiology

## 2014-05-29 ENCOUNTER — Encounter: Payer: Self-pay | Admitting: Internal Medicine

## 2014-05-29 ENCOUNTER — Other Ambulatory Visit (INDEPENDENT_AMBULATORY_CARE_PROVIDER_SITE_OTHER): Payer: BC Managed Care – PPO | Admitting: *Deleted

## 2014-05-29 ENCOUNTER — Ambulatory Visit (INDEPENDENT_AMBULATORY_CARE_PROVIDER_SITE_OTHER): Payer: BC Managed Care – PPO | Admitting: Internal Medicine

## 2014-05-29 VITALS — BP 118/80 | HR 65 | Temp 97.8°F | Resp 12 | Wt 265.0 lb

## 2014-05-29 DIAGNOSIS — E118 Type 2 diabetes mellitus with unspecified complications: Secondary | ICD-10-CM

## 2014-05-29 DIAGNOSIS — Z23 Encounter for immunization: Secondary | ICD-10-CM

## 2014-05-29 MED ORDER — INSULIN ASPART 100 UNIT/ML FLEXPEN: 15.0000 [IU] | PEN_INJECTOR | Freq: Three times a day (TID) | SUBCUTANEOUS | Status: DC

## 2014-05-29 MED ORDER — INSULIN GLARGINE 100 UNIT/ML SOLOSTAR PEN: 45.0000 [IU] | PEN_INJECTOR | Freq: Every day | SUBCUTANEOUS | Status: DC

## 2014-05-29 NOTE — Patient Instructions (Signed)
Continue Invokana 300 mg daily in am Please increase Lantus to 45 units at night Increase mealtime NovoLog 15 units for a smaller meal 17 units for a regular meal 20 units for a larger meal  Please come back in 2 months with your sugar log.  Please send me a message through MyChart with your sugars and your insulin doses in a month. We will need to call in Toujeo instead of Lantus then.

## 2014-05-29 NOTE — Progress Notes (Signed)
Subjective:     Patient ID: Randy Castro, male   DOB: 01/31/1951, 63 y.o.   MRN: [id number]  HPI Randy Castro is a pleasant 63 y.o. man returning for f/u for DM2, dx 1998, uncontrolled, insulin dependent, with complications (CAD, s/p MI 07/2011, s/p PTCA with DES, s/p recent PTCA for in-stent restenosis 12/15/12; also ED). He is here with his wife. Last visit was 1.5 mo ago.  Reviewed HbA1c levels: greatly worsened after having to stop his Metformin (GI sxs). Lab Results  Component Value Date   HGBA1C 9.8* 03/06/2014   HGBA1C 9.9* 11/02/2013   HGBA1C 8.1* 08/03/2013   He is now on a regimen of: Invokana 300 mg daily in am Lantus to 40 units at night Mealtime NovoLog 10 units for a smaller meal 12 units for a regular meal 14 units for a larger meal We stopped Glipizide when we started mealtime insulin. We stopped Bydureon when we started mealtime insulin We stopped Januvia 100 in 12/2013. He was on JanuMet 50/1000 bid >> stopped 2/2 diarrhea, fatigue >> restarted but with same SEs >> stopped. He retired on 08/15/2013 >> less activity, increased weight. Sugars have greatly increased >> we added Invokana.  He checks his sugars approx 1-2/day - still high, despite not eating out a lot anymore: - am: 127-176 >> 126-181 >> 100-186 >> 217-239 >> 213-273 >>143-200 >> 142-200 (260) >> 170-281 >> 188-252 - after b'fast: 367 >> 158 >> n/c >> 231, 315 >> n/c - before lunch: 108 - 158 >> 90s-122 >> 230-250 >> 272-408 >> 174, 235-250 >> 197-266 (354) >> 166-334 >> 179-305 - 2h after lunch: 143 >> n/c - before dinner: 132-158 >> 72-122 >> 230-250 >> 272-338 >> 227 >> 161-280 >> 152-248 (348 x1) >> 210-320 - bedtime: 233 and 274 >> n/c >> 104-148 >> 235-250 >> 282 >> n/c >> 156-311 >> 209, 254, 289 >> n/c - nighttime: 3 lows 58-60s >> n/c No lows. He has hypoglycemia awareness at 73.  - no DR per last eye exam on 04/04/2014 >> no DR. - No CKD. Last BUN/Cr:  Lab Results  Component Value Date   BUN  15 03/06/2014   CREATININE 0.9 03/06/2014  He is on lisinopril. - No neuropathy per foot exam 01/01/2014. - He has HL: Lab Results  Component Value Date   CHOL 117 04/12/2013   HDL 32.60* 04/12/2013   LDLCALC 43 11/28/2012   LDLDIRECT 59.2 04/12/2013   TRIG 269.0* 04/12/2013   CHOLHDL 4 04/12/2013  He is on Lipitor He is on ASA 81. Foot exam normal 12/2013.  PMH:   Pt had a period off the meds for cost reasons and was restarted on DM meds in 03/2011. In 07/2011, he had a NSTEMI and had a DES placed. He was also dx with CHF, which is now improved (EF increased from 40-45% in 07/2011 to 55-60% in 10/2011). Sees Dr. Aundra Dubin with Cardiology. He had CP/SOB for few months >> saw Dr. Percival Spanish on 12/15/3012 >> heart catheterization >> PTCA for LAD instent restenosis >> feels much better after this.   I reviewed pt's medications, allergies, PMH, social hx, family hx and no changes required, except as above.  Review of Systems Constitutional: no weight gain, no fatigue, no subjective hyperthermia/hypothermia Eyes: no blurry vision, no xerophthalmia ENT: no sore throat, no nodules palpated in throat, no dysphagia/odynophagia, no hoarseness Cardiovascular: no CP/no palpitations/leg swelling Respiratory: no cough/SOB Gastrointestinal: no N/V/D/C/heartburn Musculoskeletal: muscle/joint aches Skin: no rashes Neurological: no tremors/numbness/tingling/dizziness  Objective:   Physical Exam BP 118/80  Pulse 65  Temp(Src) 97.8 F (36.6 C) (Oral)  Resp 12  Wt 265 lb (120.203 kg)  SpO2 95% Wt Readings from Last 3 Encounters:  05/29/14 265 lb (120.203 kg)  04/17/14 259 lb (117.482 kg)  03/06/14 261 lb (118.389 kg)   Constitutional: overweight, in NAD Eyes: PERRLA, EOMI, no exophthalmos ENT: moist mucous membranes, no thyromegaly, no cervical lymphadenopathy Cardiovascular: RRR, No MRG Respiratory: CTA B Gastrointestinal: abdomen soft, NT, ND, BS+ Musculoskeletal: no deformities, strength intact  in all 4 Skin: moist, warm, no rashes Neurological: no tremor with outstretched hands, DTR normal in all 4 Assessment:     1. DM2, insulin dependent, uncontrolled, with complications: - CAD, s/p NSTEMI 07/2011, with PTCA + DES (Dr. Aundra Dubin), s/p PTCA for in-stent restenosis 12/15/2012- CHF, EF 55-60% per 2D ECHO in 10/2011 - ED - no DR - no nephropathy    Plan:     Patient's sugars still high >> will increase insulin doses further: both mealtime insulin and his Lantus. Will switch to [facility] when he runs out of his Lantus in 1 mo >> he will let me know. I also encouraged him to increase the mealtime insulin doses by himself as needed. - I advised him to: Patient Instructions  Continue Invokana 300 mg daily in am Please increase Lantus to 45 units at night Increase mealtime NovoLog 15 units for a smaller meal 17 units for a regular meal 20 units for a larger meal  Please come back in 2 months with your sugar log.  Please send me a message through MyChart with your sugars and your insulin doses in a month. We will need to call in Toujeo instead of Lantus then.  - HbA1c not yet due >> will recheck at next visit - return in 2 months with his sugar log.

## 2014-06-11 ENCOUNTER — Encounter: Payer: Self-pay | Admitting: Physician Assistant

## 2014-06-11 ENCOUNTER — Ambulatory Visit (INDEPENDENT_AMBULATORY_CARE_PROVIDER_SITE_OTHER): Payer: BC Managed Care – PPO | Admitting: Physician Assistant

## 2014-06-11 VITALS — BP 128/78 | HR 56 | Ht 74.0 in | Wt 266.4 lb

## 2014-06-11 DIAGNOSIS — I25118 Atherosclerotic heart disease of native coronary artery with other forms of angina pectoris: Secondary | ICD-10-CM

## 2014-06-11 DIAGNOSIS — I10 Essential (primary) hypertension: Secondary | ICD-10-CM

## 2014-06-11 DIAGNOSIS — I25119 Atherosclerotic heart disease of native coronary artery with unspecified angina pectoris: Secondary | ICD-10-CM

## 2014-06-11 DIAGNOSIS — E785 Hyperlipidemia, unspecified: Secondary | ICD-10-CM

## 2014-06-11 DIAGNOSIS — I2510 Atherosclerotic heart disease of native coronary artery without angina pectoris: Secondary | ICD-10-CM

## 2014-06-11 DIAGNOSIS — I25110 Atherosclerotic heart disease of native coronary artery with unstable angina pectoris: Secondary | ICD-10-CM

## 2014-06-11 MED ORDER — LISINOPRIL 10 MG PO TABS: 10.0000 mg | ORAL_TABLET | Freq: Two times a day (BID) | ORAL | Status: DC

## 2014-06-11 MED ORDER — SPIRONOLACTONE 25 MG PO TABS: 12.5000 mg | ORAL_TABLET | Freq: Once | ORAL | Status: DC

## 2014-06-11 MED ORDER — NITROGLYCERIN 0.4 MG SL SUBL: 0.4000 mg | SUBLINGUAL_TABLET | SUBLINGUAL | Status: DC | PRN

## 2014-06-11 MED ORDER — CARVEDILOL 12.5 MG PO TABS: 12.5000 mg | ORAL_TABLET | Freq: Two times a day (BID) | ORAL | Status: DC

## 2014-06-11 MED ORDER — CLOPIDOGREL BISULFATE 75 MG PO TABS: 75.0000 mg | ORAL_TABLET | Freq: Every day | ORAL | Status: DC

## 2014-06-11 MED ORDER — ATORVASTATIN CALCIUM 80 MG PO TABS: 80.0000 mg | ORAL_TABLET | Freq: Every day | ORAL | Status: DC

## 2014-06-11 NOTE — Progress Notes (Signed)
HPI: This is a 63 year old male patient of Dr. Aundra Dubin with history of a NSTEMI and 2012 treated with drug-eluting stent to the mid LAD. EF was 40% with apical hypokinesis at the time of MI. Repeat echo in 2013 EF 55-60%. He had unstable angina in 4/14 and had a 90% in-stent restenosis of the LAD treated with cutting balloon angioplasty. He was again readmitted with unstable angina in 12/2012 and this time had PTCA  To a 90% ostial diagonal stenosis. He was last seen in 07/2013 at which time he was doing well. Last 2-D echo in 03/2013 EF 60%. Patient also has history of insulin-dependent diabetes mellitus, hyperlipidemia and hypertension.  Patient comes in today for medication refills. He retired last year and is enjoying life. He is walking more since the weather has turned cooler. He walks about a mile once a day. He has a wood shop and takes care of his antique cars. He did fall off a ladder 3-4 weeks ago and landed on his left side which has been sore. He denies any chest pain, palpitations, dyspnea, dyspnea on exertion, dizziness or presyncope. His blood sugars have been elevated and is having trouble with his diabetic medications. He is seeing his primary care monthly to adjust his medications.  No Known Allergies   Current Outpatient Prescriptions  Medication Sig Dispense Refill  . acetaminophen (TYLENOL) 325 MG tablet Take 650 mg by mouth every 6 (six) hours as needed for pain.      Marland Kitchen aspirin EC 81 MG tablet Take 81 mg by mouth daily.      Marland Kitchen atorvastatin (LIPITOR) 80 MG tablet Take 1 tablet (80 mg total) by mouth daily.  90 tablet  1  . carvedilol (COREG) 12.5 MG tablet TAKE 1 TABLET TWICE A DAY WITH MEALS  180 tablet  0  . citalopram (CELEXA) 20 MG tablet TAKE 1 TABLET DAILY  90 tablet  0  . clopidogrel (PLAVIX) 75 MG tablet TAKE 1 TABLET DAILY  90 tablet  0  . clotrimazole-betamethasone (LOTRISONE) cream Apply 1 application topically 2 (two) times daily.      . famotidine  (PEPCID) 20 MG tablet Take 20 mg by mouth 2 (two) times daily as needed for heartburn.      Marland Kitchen glucose blood test strip Use as instructed  100 each  12  . insulin aspart (NOVOLOG FLEXPEN) 100 UNIT/ML FlexPen Inject 15-20 Units into the skin 3 (three) times daily with meals.  15 mL  11  . Insulin Glargine (LANTUS SOLOSTAR) 100 UNIT/ML Solostar Pen Inject 45 Units into the skin daily at 10 pm.  15 mL  11  . Insulin Pen Needle (FIFTY50 PEN NEEDLES) 31G X 5 MM MISC Use to inject insulin 3 times daily.  100 each  3  . INVOKANA 300 MG TABS TAKE 1 TABLET BY MOUTH EVERY DAY BEFORE BREAKFAST  30 tablet  2  . lisinopril (PRINIVIL,ZESTRIL) 10 MG tablet Take 1 tablet (10 mg total) by mouth 2 (two) times daily.  180 tablet  3  . MAGNESIUM PO Take 1 tablet by mouth daily.      . Multiple Vitamin (MULTIVITAMIN WITH MINERALS) TABS Take 1 tablet by mouth daily.      . nitroGLYCERIN (NITROSTAT) 0.4 MG SL tablet Place 0.4 mg under the tongue every 5 (five) minutes as needed for chest pain.      Marland Kitchen spironolactone (ALDACTONE) 25 MG tablet TAKE 0.5 TABLETS (12.5 MG TOTAL) BY MOUTH DAILY.  45 tablet  3   No current facility-administered medications for this visit.    Past Medical History  Diagnosis Date  . Hyperlipidemia   . HTN (hypertension)   . CAD (coronary artery disease)     a. NSTEMI 12/12: EF 40-45%. QIO:NGEXBMW balloon PTCA + Promus DES x 1 to mid LAD;  b. 11/2012: Cath with Cutting balloon PTCA  LAD for ISR to LAD, EF 55%;  c. 12/2012 Cath/PCI: LAD stents patent, 80 ost Diag (jailed)->PTCA, LCX/RCA patent.  . Ischemic cardiomyopathy     a.  echo 08/06/11: dist ant wall, apical and septal and infero-apical HK, mild LVH, EF 40%, mild LAE, PASP 34, asc aorta mildly dilated, mild TR;  b.  Echo (3/13) showed recovery of LV systolic function with EF 41-32%, grade I diastolic dysfunction, mild MR.   Marland Kitchen Exertional shortness of breath   . Type II diabetes mellitus dx in the 90s  . Arthritis     "knees probably"  (12/22/2012)    Past Surgical History  Procedure Laterality Date  . Anterior cervical decomp/discectomy fusion  in the 90s  . Coronary angioplasty with stent placement  07/2011    "1" (12/22/2012)  . Coronary angioplasty  12/15/2012; 12/22/2012    Family History  Problem Relation Age of Onset  . Colon cancer Neg Hx   . Prostate cancer Neg Hx   . Diabetes Mother     M, sister, brothers   . Coronary artery disease Father     F , MI at age 16   . Stroke Neg Hx     History   Social History  . Marital Status: Married    Spouse Name: N/A    Number of Children: 2   . Years of Education: N/A   Occupational History  . maintenance tech    Social History Main Topics  . Smoking status: Former Smoker -- 1.00 packs/day for 25 years    Types: Cigarettes    Quit date: 04/12/1992  . Smokeless tobacco: Never Used  . Alcohol Use: Yes     Comment: 12/22/2012 "rarely maybe every 3-4 months, beer"  . Drug Use: No  . Sexual Activity: Yes   Other Topics Concern  . Not on file   Social History Narrative   Moved back to [place] from [place]: See history of present illness otherwise negative  BP 128/78  Pulse 56  Ht 6\' 2"  (1.88 m)  Wt 266 lb 6.4 oz (120.838 kg)  BMI 34.19 kg/m2  PHYSICAL EXAM: Well-nournished, in no acute distress. Neck: No JVD, HJR, Bruit, or thyroid enlargement  Lungs: No tachypnea, clear without wheezing, rales, or rhonchi  Cardiovascular: RRR, PMI not displaced, heart sounds distant, positive S4, no bruit, thrill, or heave.  Abdomen: BS normal. Soft without organomegaly, masses, lesions or tenderness.  Extremities: without cyanosis, clubbing or edema. Good distal pulses bilateral  SKin: Warm, no lesions or rashes   Musculoskeletal: No deformities  Neuro: no focal signs   Wt Readings from Last 3 Encounters:  05/29/14 265 lb (120.203 kg)  04/17/14 259 lb (117.482 kg)  03/06/14 261 lb (118.389 kg)    Lab Results  Component Value Date   WBC 7.4  05/31/2013   HGB 12.5* 05/31/2013   HCT 37.0* 05/31/2013   PLT 242.0 05/31/2013   GLUCOSE 246* 03/06/2014   CHOL 117 04/12/2013   TRIG 269.0* 04/12/2013   HDL 32.60* 04/12/2013   LDLDIRECT 59.2 04/12/2013   LDLCALC 43  11/28/2012   ALT 32 05/31/2013   AST 29 05/31/2013   NA 136 03/06/2014   K 3.7 03/06/2014   CL 104 03/06/2014   CREATININE 0.9 03/06/2014   BUN 15 03/06/2014   CO2 24 03/06/2014   TSH 0.67 05/31/2013   PSA 0.44 05/31/2013   INR 1.16 12/22/2012   HGBA1C 9.8* 03/06/2014   MICROALBUR 1.72 08/05/2012

## 2014-06-11 NOTE — Patient Instructions (Signed)
Your physician recommends that you continue on your current medications as directed. Please refer to the Current Medication list given to you today   Your physician recommends that you schedule a follow-up appointment in:  [facility]

## 2014-06-11 NOTE — Assessment & Plan Note (Signed)
Blood pressure controlled. Continue lisinopril and Spironolactone.

## 2014-06-11 NOTE — Assessment & Plan Note (Signed)
Stable without chest pain. Continue Plavix, aspirin, carvedilol, and Lipitor.

## 2014-06-11 NOTE — Assessment & Plan Note (Signed)
Last lipid panel in August 2014. Have asked him to have fasting lipid panel with his next blood work at primary care.

## 2014-06-25 ENCOUNTER — Encounter: Payer: Self-pay | Admitting: Internal Medicine

## 2014-06-25 ENCOUNTER — Other Ambulatory Visit: Payer: Self-pay | Admitting: *Deleted

## 2014-06-25 MED ORDER — INSULIN GLARGINE 300 UNIT/ML ~~LOC~~ SOPN: 60.0000 [IU] | PEN_INJECTOR | Freq: Every day | SUBCUTANEOUS | Status: DC

## 2014-06-25 MED ORDER — INSULIN PEN NEEDLE 31G X 5 MM MISC: Status: DC

## 2014-06-25 MED ORDER — INSULIN ASPART 100 UNIT/ML FLEXPEN: 28.0000 [IU] | PEN_INJECTOR | Freq: Three times a day (TID) | SUBCUTANEOUS | Status: DC

## 2014-06-25 NOTE — Telephone Encounter (Signed)
Per Dr Cruzita Lederer:   Continue Invokana 300 mg daily in am    Please stop Lantus and switch to Toujeo, increasing to 60 units at night    Increase mealtime NovoLog    - 28 units for a smaller meal    - 32 units for a regular meal    - 36 units for a larger meal    Let's call in Toujeo pens 1 box of 4 with 2 refills and NovoLog pens: 10 pens with 2 refills.

## 2014-06-26 ENCOUNTER — Other Ambulatory Visit: Payer: Self-pay | Admitting: *Deleted

## 2014-06-26 ENCOUNTER — Telehealth: Payer: Self-pay | Admitting: Internal Medicine

## 2014-06-26 MED ORDER — INSULIN GLARGINE 300 UNIT/ML ~~LOC~~ SOPN: 60.0000 [IU] | PEN_INJECTOR | Freq: Every day | SUBCUTANEOUS | Status: DC

## 2014-06-26 MED ORDER — INSULIN PEN NEEDLE 31G X 5 MM MISC: Status: DC

## 2014-06-26 NOTE — Telephone Encounter (Signed)
Pt's rx was sent to [facility]. Resent to [facility] in [place].

## 2014-06-26 NOTE — Telephone Encounter (Signed)
Patient's wife called stating that [name] was never called in   Please advise   [facility] [place]   Please advise

## 2014-07-08 ENCOUNTER — Other Ambulatory Visit: Payer: Self-pay | Admitting: Internal Medicine

## 2014-07-11 IMAGING — CR DG CHEST 2V
2 series · 2 of 2 positions shown · non-contrast
Comparison: August 06, 2011.

CLINICAL DATA: Chest pain

CHEST - 2 VIEW

[w chest pa]
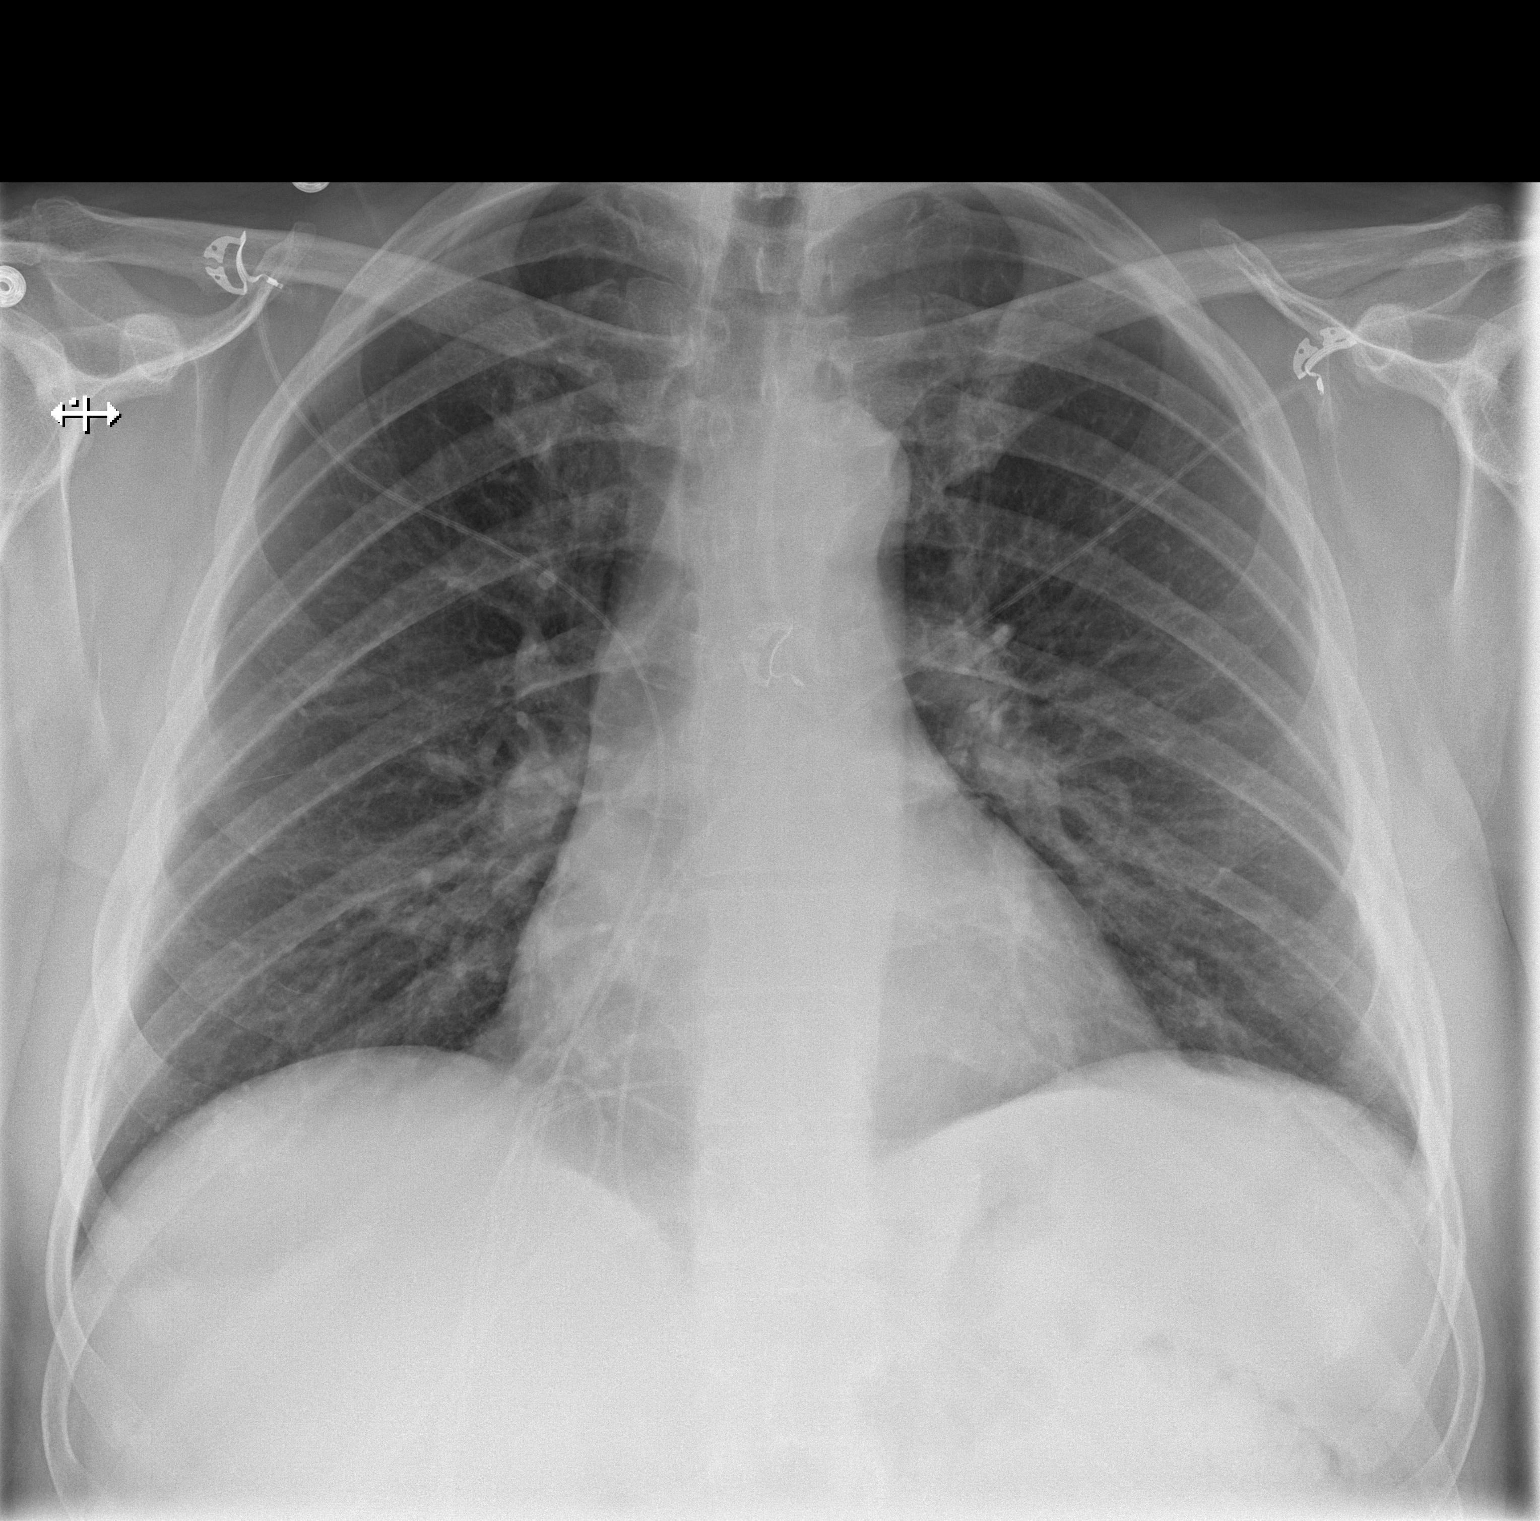

[w chest lat]
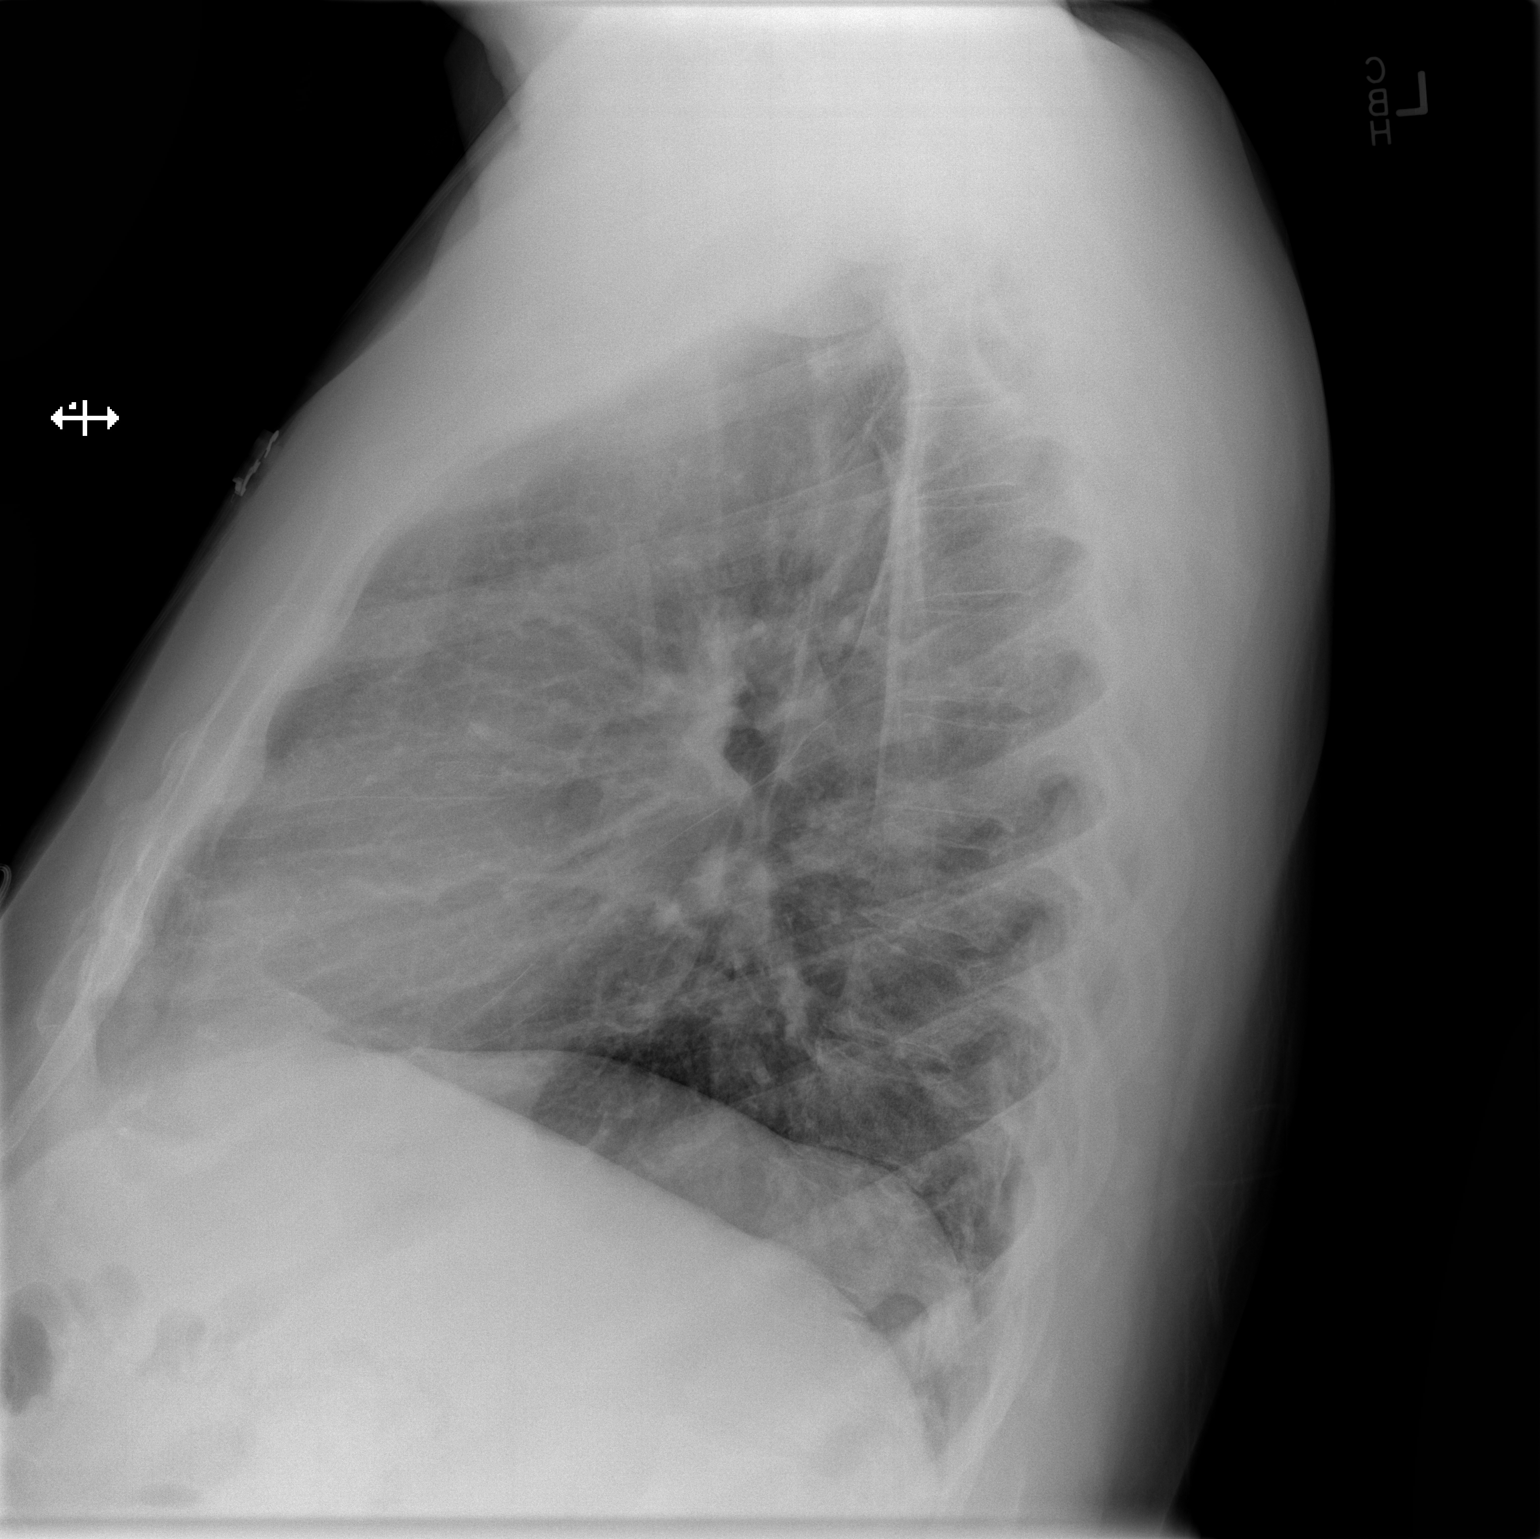

[2 of 2 positions shown; findings below may reference images not displayed]

FINDINGS: Cardiomediastinal silhouette appears normal.  No acute
pulmonary disease is noted.  Bony thorax is intact.
IMPRESSION: No acute cardiopulmonary abnormality seen.

## 2014-07-16 ENCOUNTER — Encounter: Payer: Self-pay | Admitting: Internal Medicine

## 2014-07-17 ENCOUNTER — Other Ambulatory Visit: Payer: Self-pay | Admitting: *Deleted

## 2014-07-17 ENCOUNTER — Other Ambulatory Visit: Payer: Self-pay | Admitting: Internal Medicine

## 2014-07-17 MED ORDER — INSULIN ASPART 100 UNIT/ML FLEXPEN
40.0000 [IU] | PEN_INJECTOR | Freq: Three times a day (TID) | SUBCUTANEOUS | Status: DC
Start: 2014-07-17 — End: 2014-08-02

## 2014-07-20 ENCOUNTER — Other Ambulatory Visit: Payer: Self-pay | Admitting: Cardiology

## 2014-07-20 ENCOUNTER — Encounter: Payer: Self-pay | Admitting: Internal Medicine

## 2014-07-20 MED ORDER — CITALOPRAM HYDROBROMIDE 20 MG PO TABS: 20.0000 mg | ORAL_TABLET | Freq: Every day | ORAL | Status: DC

## 2014-07-20 NOTE — Telephone Encounter (Signed)
Med filled.  

## 2014-07-21 ENCOUNTER — Other Ambulatory Visit: Payer: Self-pay | Admitting: Cardiology

## 2014-07-23 ENCOUNTER — Other Ambulatory Visit: Payer: Self-pay

## 2014-07-23 MED ORDER — CITALOPRAM HYDROBROMIDE 20 MG PO TABS: 20.0000 mg | ORAL_TABLET | Freq: Every day | ORAL | Status: DC

## 2014-07-24 ENCOUNTER — Other Ambulatory Visit: Payer: Self-pay

## 2014-07-24 ENCOUNTER — Encounter: Payer: Self-pay | Admitting: Cardiology

## 2014-07-24 MED ORDER — CARVEDILOL 12.5 MG PO TABS: 12.5000 mg | ORAL_TABLET | Freq: Two times a day (BID) | ORAL | Status: DC

## 2014-07-26 ENCOUNTER — Other Ambulatory Visit: Payer: Self-pay

## 2014-07-26 ENCOUNTER — Encounter: Payer: Self-pay | Admitting: Cardiology

## 2014-07-26 MED ORDER — CARVEDILOL 12.5 MG PO TABS: 12.5000 mg | ORAL_TABLET | Freq: Two times a day (BID) | ORAL | Status: DC

## 2014-07-30 ENCOUNTER — Ambulatory Visit (INDEPENDENT_AMBULATORY_CARE_PROVIDER_SITE_OTHER): Payer: BC Managed Care – PPO | Admitting: Internal Medicine

## 2014-07-30 ENCOUNTER — Encounter: Payer: Self-pay | Admitting: Internal Medicine

## 2014-07-30 VITALS — BP 114/64 | HR 72 | Temp 98.1°F | Resp 14 | Wt 272.0 lb

## 2014-07-30 DIAGNOSIS — E1159 Type 2 diabetes mellitus with other circulatory complications: Secondary | ICD-10-CM

## 2014-07-30 MED ORDER — INSULIN PEN NEEDLE 32G X 4 MM MISC: Status: DC

## 2014-07-30 MED ORDER — METFORMIN HCL ER 500 MG PO TB24: 1000.0000 mg | ORAL_TABLET | Freq: Two times a day (BID) | ORAL | Status: DC

## 2014-07-30 NOTE — Patient Instructions (Signed)
Patient Instructions  Continue Invokana 300 mg daily in am Please increase Toujeo to 60 units at night Continue 38 units NovoLog before every meal Start Metformin XR P500 mg with dinner x 3 days. If you tolerate this well, add another Metformin tablet (500 mg) with breakfast x 3 days. If you tolerate this well, add another metformin tablet with dinner (total 1000 mg) x 3 days. If you tolerate this well, add another metformin tablet with breakfast (total 1000 mg). Continue with 1000 mg of metformin 2x a day with breakfast and dinner.  Please come back in 1.5 months with your sugar log.  Please stop at the lab.

## 2014-07-30 NOTE — Progress Notes (Signed)
Subjective:     Patient ID: Randy Castro, male   DOB: 21-Apr-1951, 63 y.o.   MRN: [id number]  HPI Randy Castro is a pleasant 63 y.o. man returning for f/u for DM2, dx 1998, uncontrolled, insulin dependent, with complications (CAD, s/p MI 07/2011, s/p PTCA with DES, s/p recent PTCA for in-stent restenosis 12/15/12; also ED). He is here with his wife who offers part of the hx. Last visit was 2 mo ago.  Reviewed HbA1c levels: worsened after having to stop his Metformin (GI sxs). Lab Results  Component Value Date   HGBA1C 9.8* 03/06/2014   HGBA1C 9.9* 11/02/2013   HGBA1C 8.1* 08/03/2013   He is now on a regimen of: Invokana 300 mg daily in am Lantus to 40 >> Toujeo 45 units at night Mealtime NovoLog 15 units for a smaller meal 17 units for a regular meal 20 units for a larger meal >> 38 units tid  We stopped Glipizide when we started mealtime insulin. We stopped Bydureon when we started mealtime insulin We stopped Januvia 100 in 12/2013. He was on JanuMet 50/1000 bid >> stopped 2/2 diarrhea, fatigue >> restarted but with same SEs >> stopped. He retired on 08/15/2013 >> less activity, increased weight. Sugars have greatly increased >> we added Invokana.  He checks his sugars approx 1-2/day - still high, despite not eating out a lot anymore: - am: 127-176 >> 126-181 >> 100-186 >> 217-239 >> 213-273 >>143-200 >> 142-200 (260) >> 170-281 >> 188-252 >> 162-225, 255 - after b'fast: 367 >> 158 >> n/c >> 231, 315 >> n/c - before lunch: 108 - 158 >> 90s-122 >> 230-250 >> 272-408 >> 174, 235-250 >> 197-266 (354) >> 166-334 >> 179-305 >> 180-310 - 2h after lunch: 143 >> n/c - before dinner: 132-158 >> 72-122 >> 230-250 >> 272-338 >> 227 >> 161-280 >> 152-248 (348 x1) >> 210-320 >> 111-229 - bedtime: 233 and 274 >> n/c >> 104-148 >> 235-250 >> 282 >> n/c >> 156-311 >> 209, 254, 289 >> n/c - nighttime: 3 lows 58-60s >> n/c No lows. He has hypoglycemia awareness at 30.  - no DR per last eye exam on  04/04/2014 >> no DR. - No CKD. Last BUN/Cr:  Lab Results  Component Value Date   BUN 15 03/06/2014   CREATININE 0.9 03/06/2014  He is on lisinopril. - No neuropathy per foot exam 01/01/2014. - He has HL: Lab Results  Component Value Date   CHOL 117 04/12/2013   HDL 32.60* 04/12/2013   LDLCALC 43 11/28/2012   LDLDIRECT 59.2 04/12/2013   TRIG 269.0* 04/12/2013   CHOLHDL 4 04/12/2013  He is on Lipitor He is on ASA 81. Foot exam normal 12/2013.  PMH:   Pt had a period off the meds for cost reasons and was restarted on DM meds in 03/2011. In 07/2011, he had a NSTEMI and had a DES placed. He was also dx with CHF, which is now improved (EF increased from 40-45% in 07/2011 to 55-60% in 10/2011). Sees Dr. Aundra Dubin with Cardiology. He had CP/SOB for few months >> saw Dr. Percival Spanish on 12/15/3012 >> heart catheterization >> PTCA for LAD instent restenosis >> feels much better after this.   I reviewed pt's medications, allergies, PMH, social hx, family hx and no changes required, except as above.  Review of Systems Constitutional: no weight gain, no fatigue, no subjective hyperthermia/hypothermia Eyes: no blurry vision, no xerophthalmia ENT: no sore throat, no nodules palpated in throat, no dysphagia/odynophagia, no hoarseness  Cardiovascular: no CP/no palpitations/leg swelling Respiratory: no cough/SOB Gastrointestinal: no N/V/D/C/heartburn Musculoskeletal: muscle/joint aches Skin: no rashes Neurological: no tremors/numbness/tingling/dizziness  Objective:   Physical Exam BP 114/64 mmHg  Pulse 72  Temp(Src) 98.1 F (36.7 C) (Oral)  Resp 14  Wt 272 lb (123.378 kg)  SpO2 94% Body mass index is 34.91 kg/(m^2).  Wt Readings from Last 3 Encounters:  07/30/14 272 lb (123.378 kg)  06/11/14 266 lb 6.4 oz (120.838 kg)  05/29/14 265 lb (120.203 kg)   Constitutional: overweight, in NAD Eyes: PERRLA, EOMI, no exophthalmos ENT: moist mucous membranes, no thyromegaly, no cervical  lymphadenopathy Cardiovascular: RRR, No MRG Respiratory: CTA B Gastrointestinal: abdomen soft, NT, ND, BS+ Musculoskeletal: no deformities, strength intact in all 4 Skin: moist, warm, no rashes Neurological: no tremor with outstretched hands, DTR normal in all 4 Assessment:     1. DM2, insulin dependent, uncontrolled, with complications: - CAD, s/p NSTEMI 07/2011, with PTCA + DES (Dr. Aundra Dubin), s/p PTCA for in-stent restenosis 12/15/2012- CHF, EF 55-60% per 2D ECHO in 10/2011 - ED - no DR - no nephropathy    Plan:     Patient's sugars still high >> will increase Lantus further and try to add Metformin.Due to previous diarrhea and fatigue with JanuMet >> will try XR. We switched to [facility] >> will increase to 55 units. - I advised him to: Patient Instructions  Continue Invokana 300 mg daily in am Please increase Toujeo to 60 units at night Continue 38 units NovoLog before every meal Start Metformin XR P500 mg with dinner x 3 days. If you tolerate this well, add another Metformin tablet (500 mg) with breakfast x 3 days. If you tolerate this well, add another metformin tablet with dinner (total 1000 mg) x 3 days. If you tolerate this well, add another metformin tablet with breakfast (total 1000 mg). Continue with 1000 mg of metformin 2x a day with breakfast and dinner.  Please come back in 1.5 months with your sugar log.  - check HbA1c today - had flu vaccine this season - since now right after lunch, will have lipids checked by PCP in January 2016. - return in 1.5 months with his sugar log.   He did not stop at the lab.

## 2014-08-01 ENCOUNTER — Other Ambulatory Visit: Payer: Self-pay | Admitting: Cardiology

## 2014-08-01 ENCOUNTER — Encounter (HOSPITAL_COMMUNITY): Payer: Self-pay | Admitting: Cardiovascular Disease

## 2014-08-02 ENCOUNTER — Other Ambulatory Visit: Payer: Self-pay | Admitting: Internal Medicine

## 2014-08-02 ENCOUNTER — Encounter (HOSPITAL_COMMUNITY): Payer: Self-pay | Admitting: Cardiovascular Disease

## 2014-08-06 ENCOUNTER — Encounter: Payer: Self-pay | Admitting: Cardiology

## 2014-08-13 ENCOUNTER — Other Ambulatory Visit: Payer: Self-pay

## 2014-08-13 DIAGNOSIS — I2510 Atherosclerotic heart disease of native coronary artery without angina pectoris: Secondary | ICD-10-CM

## 2014-08-13 DIAGNOSIS — I10 Essential (primary) hypertension: Secondary | ICD-10-CM

## 2014-08-13 DIAGNOSIS — E785 Hyperlipidemia, unspecified: Secondary | ICD-10-CM

## 2014-08-13 MED ORDER — ATORVASTATIN CALCIUM 80 MG PO TABS: 80.0000 mg | ORAL_TABLET | Freq: Every day | ORAL | Status: DC

## 2014-08-13 MED ORDER — CLOPIDOGREL BISULFATE 75 MG PO TABS: 75.0000 mg | ORAL_TABLET | Freq: Every day | ORAL | Status: DC

## 2014-09-10 ENCOUNTER — Other Ambulatory Visit: Payer: Self-pay | Admitting: Internal Medicine

## 2014-09-11 ENCOUNTER — Encounter: Payer: Self-pay | Admitting: Internal Medicine

## 2014-09-11 ENCOUNTER — Other Ambulatory Visit: Payer: Self-pay | Admitting: *Deleted

## 2014-09-11 ENCOUNTER — Ambulatory Visit (INDEPENDENT_AMBULATORY_CARE_PROVIDER_SITE_OTHER): Payer: BC Managed Care – PPO | Admitting: Internal Medicine

## 2014-09-11 VITALS — BP 138/59 | HR 62 | Temp 97.5°F | Resp 12 | Wt 273.0 lb

## 2014-09-11 DIAGNOSIS — E118 Type 2 diabetes mellitus with unspecified complications: Secondary | ICD-10-CM

## 2014-09-11 LAB — HEMOGLOBIN A1C: Hgb A1c MFr Bld: 10 % — ABNORMAL HIGH (ref 4.6–6.5)

## 2014-09-11 MED ORDER — INSULIN GLARGINE 300 UNIT/ML ~~LOC~~ SOPN: 60.0000 [IU] | PEN_INJECTOR | Freq: Every day | SUBCUTANEOUS | Status: DC

## 2014-09-11 NOTE — Patient Instructions (Addendum)
Please stop at the lab.  Please continue: - Invokana 300 mg daily in am - Metformin XR increase to 500 mg 3x a day - Toujeo 60 units at night - NovoLog: 25 units with a smaller meal 30 units with a regular meal 35 units with a large meal  Please return in 3 months with your sugar log.

## 2014-09-11 NOTE — Progress Notes (Signed)
Subjective:     Patient ID: Randy Castro, male   DOB: 15-Jun-1951, 64 y.o.   MRN: [id number]  HPI Mr. Potempa is a pleasant 64 y.o. man returning for f/u for DM2, dx 1998, uncontrolled, insulin dependent, with complications (CAD, s/p MI 07/2011, s/p PTCA with DES, s/p recent PTCA for in-stent restenosis 12/15/12; also ED). He is here with his wife who offers part of the hx. Last visit was 3 mo ago.  Reviewed HbA1c levels: worsened after having to stop his Metformin (GI sxs). Lab Results  Component Value Date   HGBA1C 9.8* 03/06/2014   HGBA1C 9.9* 11/02/2013   HGBA1C 8.1* 08/03/2013   He is now on a regimen of: - Invokana 300 mg daily in am - Metformin XR 1000 mg 2x a day >> but decreased to 500 mg 2x a day b/c diarrhea - Lantus to 40 >> Toujeo 45 >> 60 units at night - Mealtime NovoLog 38 >> 25 units tid   We stopped Glipizide when we started mealtime insulin. We stopped Bydureon when we started mealtime insulin We stopped Januvia 100 in 12/2013. He was on JanuMet 50/1000 bid >> stopped 2/2 diarrhea, fatigue >> restarted but with same SEs >> stopped. He retired on 08/15/2013 >> less activity, increased weight. Sugars have greatly increased >> we added Invokana.  He checks his sugars approx 1-2/day - still high, bur  A little better: - am:  142-200 (260) >> 170-281 >> 188-252 >> 162-225, 255 >> 102, 145-217 - after b'fast: 367 >> 158 >> n/c >> 231, 315 >> n/c - before lunch: 197-266 (354) >> 166-334 >> 179-305 >> 180-310 >> 91, 129-244, 271 - 2h after lunch: 143 >> n/c - before dinner: 161-280 >> 152-248 (348 x1) >> 210-320 >> 111-229 >> 147-232 - bedtime: 235-250 >> 282 >> n/c >> 156-311 >> 209, 254, 289 >> n/c >> 89, 144, 205-250, 316 - nighttime: 3 lows 58-60s >> n/c No lows. He has hypoglycemia awareness at 59.  - no DR per last eye exam on 04/04/2014 >> no DR. - No CKD. Last BUN/Cr:  Lab Results  Component Value Date   BUN 15 03/06/2014   CREATININE 0.9 03/06/2014  He is on  lisinopril. - No neuropathy per foot exam 01/01/2014. - He has HL: Lab Results  Component Value Date   CHOL 117 04/12/2013   HDL 32.60* 04/12/2013   LDLCALC 43 11/28/2012   LDLDIRECT 59.2 04/12/2013   TRIG 269.0* 04/12/2013   CHOLHDL 4 04/12/2013  He is on Lipitor He is on ASA 81. Foot exam normal 12/2013.  PMH:   Pt had a period off the meds for cost reasons and was restarted on DM meds in 03/2011. In 07/2011, he had a NSTEMI and had a DES placed. He was also dx with CHF, which is now improved (EF increased from 40-45% in 07/2011 to 55-60% in 10/2011). Sees Dr. Aundra Dubin with Cardiology. He had CP/SOB for few months >> saw Dr. Percival Spanish on 12/15/3012 >> heart catheterization >> PTCA for LAD instent restenosis >> feels much better after this.   I reviewed pt's medications, allergies, PMH, social hx, family hx and no changes required, except as above.  Review of Systems Constitutional: no weight gain, no fatigue, no subjective hyperthermia/hypothermia Eyes: no blurry vision, no xerophthalmia ENT: no sore throat, no nodules palpated in throat, no dysphagia/odynophagia, no hoarseness Cardiovascular: no CP/no palpitations/leg swelling Respiratory: no cough/SOB Gastrointestinal: no N/V/D/C/heartburn Musculoskeletal: muscle/joint aches Skin: no rashes Neurological: no tremors/numbness/tingling/dizziness  Objective:   Physical Exam BP 138/59 mmHg  Pulse 62  Temp(Src) 97.5 F (36.4 C) (Oral)  Resp 12  Wt 273 lb (123.832 kg)  SpO2 97% Body mass index is 35.04 kg/(m^2).  Wt Readings from Last 3 Encounters:  09/11/14 273 lb (123.832 kg)  07/30/14 272 lb (123.378 kg)  06/11/14 266 lb 6.4 oz (120.838 kg)   Constitutional: overweight, in NAD Eyes: PERRLA, EOMI, no exophthalmos ENT: moist mucous membranes, no thyromegaly, no cervical lymphadenopathy Cardiovascular: RRR, No MRG Respiratory: CTA B Gastrointestinal: abdomen soft, NT, ND, BS+ Musculoskeletal: no deformities, strength  intact in all 4 Skin: moist, warm, no rashes Neurological: no tremor with outstretched hands, DTR normal in all 4 Assessment:     1. DM2, insulin dependent, uncontrolled, with complications: - CAD, s/p NSTEMI 07/2011, with PTCA + DES (Dr. Aundra Dubin), s/p PTCA for in-stent restenosis 12/15/2012- CHF, EF 55-60% per 2D ECHO in 10/2011 - ED - no DR - no nephropathy    Plan:     Patient's sugars still high, but a little better after starting to exercise and after adding Metformin >> will add an extra dose at lunch >> he agrees to try. Also, will need more NovoLog: - I advised him to: Patient Instructions  Please stop at the lab.  Please continue: - Invokana 300 mg daily in am - Metformin XR increase to 500 mg 3x a day - Toujeo 60 units at night - NovoLog: 25 units with a smaller meal 30 units with a regular meal 35 units with a large meal  Please return in 3 months with your sugar log.   - check HbA1c today - had flu vaccine this season - UTD with eye exam - return in 3 months with his sugar log. May need U500 then  Office Visit on 09/11/2014  Component Date Value Ref Range Status  . Hgb A1c MFr Bld 09/11/2014 10.0* 4.6 - 6.5 % Final   Glycemic Control Guidelines for People with Diabetes:Non Diabetic:  <6%Goal of Therapy: <7%Additional Action Suggested:  >8%   Hemoglobin A1c higher than before. At next visit, if sugars not much better, will switch to U500.

## 2014-09-12 ENCOUNTER — Encounter: Payer: Self-pay | Admitting: Internal Medicine

## 2014-09-18 ENCOUNTER — Encounter: Payer: BC Managed Care – PPO | Admitting: Internal Medicine

## 2014-10-04 ENCOUNTER — Other Ambulatory Visit: Payer: Self-pay | Admitting: Cardiology

## 2014-10-08 ENCOUNTER — Other Ambulatory Visit: Payer: Self-pay | Admitting: Internal Medicine

## 2014-10-09 ENCOUNTER — Other Ambulatory Visit: Payer: Self-pay | Admitting: Internal Medicine

## 2014-10-19 ENCOUNTER — Other Ambulatory Visit: Payer: Self-pay | Admitting: Internal Medicine

## 2014-10-24 ENCOUNTER — Ambulatory Visit (INDEPENDENT_AMBULATORY_CARE_PROVIDER_SITE_OTHER): Payer: BC Managed Care – PPO | Admitting: Cardiology

## 2014-10-24 ENCOUNTER — Encounter: Payer: Self-pay | Admitting: Cardiology

## 2014-10-24 VITALS — BP 130/62 | HR 66 | Ht 74.0 in | Wt 275.0 lb

## 2014-10-24 DIAGNOSIS — E785 Hyperlipidemia, unspecified: Secondary | ICD-10-CM

## 2014-10-24 DIAGNOSIS — I10 Essential (primary) hypertension: Secondary | ICD-10-CM

## 2014-10-24 DIAGNOSIS — I2510 Atherosclerotic heart disease of native coronary artery without angina pectoris: Secondary | ICD-10-CM

## 2014-10-24 LAB — LIPID PANEL
CHOL/HDL RATIO: 5
Cholesterol: 158 mg/dL (ref 0–200)
HDL: 29.4 mg/dL — AB (ref >39.00)
Triglycerides: 616 mg/dL — ABNORMAL HIGH (ref 0.0–149.0)

## 2014-10-24 LAB — BASIC METABOLIC PANEL
BUN: 26 mg/dL — AB (ref 6–23)
CHLORIDE: 103 meq/L (ref 96–112)
CO2: 28 mEq/L (ref 19–32)
Calcium: 9.7 mg/dL (ref 8.4–10.5)
Creatinine, Ser: 1.09 mg/dL (ref 0.40–1.50)
GFR: 72.58 mL/min (ref >60.00)
Glucose, Bld: 118 mg/dL — ABNORMAL HIGH (ref 70–99)
Potassium: 4 mEq/L (ref 3.5–5.1)
SODIUM: 137 meq/L (ref 135–145)

## 2014-10-24 LAB — LDL CHOLESTEROL, DIRECT: Direct LDL: 55 mg/dL

## 2014-10-24 MED ORDER — LISINOPRIL 10 MG PO TABS: 10.0000 mg | ORAL_TABLET | Freq: Two times a day (BID) | ORAL | Status: DC

## 2014-10-24 NOTE — Patient Instructions (Signed)
Your physician recommends that you have a lipid profile /BMET today.  Your physician recommends that you return for lab work in: 4 months--BMET.  Your physician wants you to follow-up in: 1 year with Dr Aundra Dubin. (March 2017).  You will receive a reminder letter in the mail two months in advance. If you don't receive a letter, please call our office to schedule the follow-up appointment.

## 2014-10-24 NOTE — Progress Notes (Signed)
Patient ID: Randy Castro, male   DOB: 07-06-1951, 64 y.o.   MRN: [id number] PCP: Dr. Larose Kells  64 yo with history of DM and HTN had NSTEMI in 12/12, unstable angina in 4/14 and 5/14.  He had DES to mid LAD in 12/12.  EF was 40% with apical hypokinesis at the time of the MI.  Repeat echo in 3/13 showed EF 55-60%.  In 4/14, he was admitted with unstable angina and had 90% pLAD in-stent restenosis.  This was treated with cutting balloon angioplasty.  He was readmitted, again with unstable angina, in 5/14.  This time he had PTCA to 90% ostial D1 stenosis.    Patient has been doing well in general.  He is now retired and not as active.  Weight is up 9 lbs since last appointment.  No chest pain. No exertional dyspnea.     ECG: NSR, LVH, nonspecific T wave flattening.     Labs (1/13): LDL 47, HDL 35, K 3.7, creatinine 1.0 Labs (2/13): K 4.1, creatinine 0.7 Labs (5/13): K 4.5, creatinine 0.8, LDL 40, HDL 34 Labs (5/14): K 4.1, creatinine 0.68 Labs (8/14): K 4, creatinine 0.9, HDL 33, LDL 59 Labs (7/15): K 3.7, creatinine 0.9  PMH: 1. Diabetes mellitus 2. HTN 3. Hyperlipidemia 4. H/o neck surgery 5. CAD: NSTEMI in 12/12 with 99% mid LAD stenosis treated with DES 3 x 16 Promus to mid LAD. Unstable angina 4/14 with LHC showing 90% pLAD in-stent restenosis.  This was treated with cutting balloon angioplasty.  He was readmitted again with unstable angina in 5/14.  LHC showed 80-90% ostial D1 which was treated by PTCA.  6. Ischemic cardiomyopathy: EF 40-45% with anterior HK on LV-gram in 12/12.  Echo (12/12) with EF 40%, apical hypokinesis, mild LVH.  Echo (3/13) showed recovery of LV systolic function with EF 24-23%, grade I diastolic dysfunction, mild MR.  Echo (8/14) with EF 60%, moderate LVH.   SH: Married, lives in [place].  Retired Architectural technologist.  Quit smoking in 1993.  Enjoys restoring hot rods.   FH: Father with MI at 42.  Brother with CAD.   ROS: All systems reviewed and negative except as  per HPI.   Current Outpatient Prescriptions  Medication Sig Dispense Refill  . acetaminophen (TYLENOL) 325 MG tablet Take 650 mg by mouth every 6 (six) hours as needed for pain.    Marland Kitchen aspirin EC 81 MG tablet Take 81 mg by mouth daily.    Marland Kitchen atorvastatin (LIPITOR) 80 MG tablet Take 1 tablet (80 mg total) by mouth daily. 90 tablet 2  . carvedilol (COREG) 12.5 MG tablet Take 1 tablet (12.5 mg total) by mouth 2 (two) times daily with a meal. 90 tablet 3  . citalopram (CELEXA) 20 MG tablet TAKE 1 TABLET BY MOUTH EVERY DAY 90 tablet 0  . clopidogrel (PLAVIX) 75 MG tablet Take 1 tablet (75 mg total) by mouth daily. 90 tablet 2  . clotrimazole-betamethasone (LOTRISONE) cream Apply 1 application topically 2 (two) times daily.    . famotidine (PEPCID) 20 MG tablet Take 20 mg by mouth 2 (two) times daily as needed for heartburn.    Marland Kitchen glucose blood test strip Use as instructed 100 each 12  . Insulin Glargine (TOUJEO SOLOSTAR) 300 UNIT/ML SOPN Inject 60 Units into the skin daily at 10 pm. 6 pen 2  . Insulin Pen Needle 32G X 4 MM MISC Use 4x a day 300 each 2  . INVOKANA 300 MG TABS tablet TAKE  1 TABLET BY MOUTH EVERY DAY BEFORE BREAKFAST 30 tablet 0  . lisinopril (PRINIVIL,ZESTRIL) 10 MG tablet Take 1 tablet (10 mg total) by mouth 2 (two) times daily. 180 tablet 3  . MAGNESIUM PO Take 1 tablet by mouth daily.    . metFORMIN (GLUCOPHAGE XR) 500 MG 24 hr tablet Take 2 tablets (1,000 mg total) by mouth 2 (two) times daily with a meal. 120 tablet 1  . Multiple Vitamin (MULTIVITAMIN WITH MINERALS) TABS Take 1 tablet by mouth daily.    . nitroGLYCERIN (NITROSTAT) 0.4 MG SL tablet Place 1 tablet (0.4 mg total) under the tongue every 5 (five) minutes as needed for chest pain. 25 tablet 5  . NOVOLOG FLEXPEN 100 UNIT/ML FlexPen INJECT 40 UNITS SUBCUTANEOUS THREE TIMES DAILY WITH MEALS 15 mL 0  . spironolactone (ALDACTONE) 25 MG tablet TAKE ONE-HALF TABLET BY MOUTH EVERY DAY 45 tablet 0   No current  facility-administered medications for this visit.   BP 130/62 mmHg  Pulse 66  Ht 6\' 2"  (1.88 m)  Wt 275 lb (124.739 kg)  BMI 35.29 kg/m2 General: NAD, obese.  Neck: Thick, no JVD, no thyromegaly or thyroid nodule.  Lungs: Clear to auscultation bilaterally with normal respiratory effort. CV: Nondisplaced PMI.  Heart regular S1/S2, no S3/S4, no murmur.  No peripheral edema.  No carotid bruit.  Normal pedal pulses.  Abdomen: Soft, nontender, no hepatosplenomegaly, no distention.  Neurologic: Alert and oriented x 3.  Psych: Normal affect. Extremities: No clubbing or cyanosis.   Assessment/Plan:  Coronary Artery Disease  No current ischemic symptoms. Continue ASA, Plavix, ACEI, beta blocker, statin.  I will continue plavix long-term.  Ischemic Cardiomyopathy  EF has returned to normal, 60% on 8/14 echo. Continue current doses of Coreg, lisinopril, and spironolactone. Will get a BMET today.  With spironolactone use, should get BMET at least every 4 months.  I will set him up for BMET again in 2 months.  Hyperlipidemia Check lipids today.   Loralie Champagne 10/24/2014

## 2014-10-29 ENCOUNTER — Other Ambulatory Visit: Payer: Self-pay | Admitting: *Deleted

## 2014-10-29 DIAGNOSIS — E785 Hyperlipidemia, unspecified: Secondary | ICD-10-CM

## 2014-10-29 MED ORDER — FENOFIBRATE 145 MG PO TABS: 145.0000 mg | ORAL_TABLET | Freq: Every day | ORAL | Status: DC

## 2014-10-30 ENCOUNTER — Other Ambulatory Visit: Payer: Self-pay | Admitting: Internal Medicine

## 2014-11-05 ENCOUNTER — Other Ambulatory Visit: Payer: Self-pay | Admitting: Internal Medicine

## 2014-11-19 ENCOUNTER — Telehealth: Payer: Self-pay | Admitting: Internal Medicine

## 2014-11-19 NOTE — Telephone Encounter (Signed)
Pre vist letter sent

## 2014-11-21 ENCOUNTER — Other Ambulatory Visit: Payer: Self-pay | Admitting: Internal Medicine

## 2014-12-04 ENCOUNTER — Encounter: Payer: Self-pay | Admitting: *Deleted

## 2014-12-04 ENCOUNTER — Telehealth: Payer: Self-pay | Admitting: *Deleted

## 2014-12-04 NOTE — Telephone Encounter (Signed)
Unable to reach patient at time of Pre-Visit Call.  Left message for patient to return call when available.    

## 2014-12-04 NOTE — Addendum Note (Signed)
Addended by: Leticia Penna A on: 12/04/2014 05:03 PM   Modules accepted: Medications

## 2014-12-04 NOTE — Telephone Encounter (Signed)
Pre-Visit Call completed with patient and chart updated.   Pre-Visit Info documented in Specialty Comments under [employer].    

## 2014-12-05 ENCOUNTER — Ambulatory Visit (INDEPENDENT_AMBULATORY_CARE_PROVIDER_SITE_OTHER): Payer: BC Managed Care – PPO | Admitting: Internal Medicine

## 2014-12-05 ENCOUNTER — Encounter: Payer: Self-pay | Admitting: Internal Medicine

## 2014-12-05 VITALS — BP 124/76 | HR 66 | Temp 98.2°F | Ht 74.0 in | Wt 274.5 lb

## 2014-12-05 DIAGNOSIS — Z125 Encounter for screening for malignant neoplasm of prostate: Secondary | ICD-10-CM | POA: Diagnosis not present

## 2014-12-05 DIAGNOSIS — Z23 Encounter for immunization: Secondary | ICD-10-CM | POA: Diagnosis not present

## 2014-12-05 DIAGNOSIS — Z0000 Encounter for general adult medical examination without abnormal findings: Secondary | ICD-10-CM

## 2014-12-05 DIAGNOSIS — L989 Disorder of the skin and subcutaneous tissue, unspecified: Secondary | ICD-10-CM

## 2014-12-05 LAB — CBC WITH DIFFERENTIAL/PLATELET
BASOS ABS: 0 10*3/uL (ref 0.0–0.1)
Basophils Relative: 0.7 % (ref 0.0–3.0)
EOS PCT: 3.8 % (ref 0.0–5.0)
Eosinophils Absolute: 0.3 10*3/uL (ref 0.0–0.7)
HEMATOCRIT: 41.3 % (ref 39.0–52.0)
Hemoglobin: 14 g/dL (ref 13.0–17.0)
LYMPHS ABS: 2 10*3/uL (ref 0.7–4.0)
Lymphocytes Relative: 30 % (ref 12.0–46.0)
MCHC: 33.9 g/dL (ref 30.0–36.0)
MCV: 83.8 fl (ref 78.0–100.0)
MONOS PCT: 9.6 % (ref 3.0–12.0)
Monocytes Absolute: 0.6 10*3/uL (ref 0.1–1.0)
Neutro Abs: 3.7 10*3/uL (ref 1.4–7.7)
Neutrophils Relative %: 55.9 % (ref 43.0–77.0)
Platelets: 267 10*3/uL (ref 150.0–400.0)
RBC: 4.93 Mil/uL (ref 4.22–5.81)
RDW: 14.6 % (ref 11.5–15.5)
WBC: 6.5 10*3/uL (ref 4.0–10.5)

## 2014-12-05 LAB — PSA: PSA: 0.35 ng/mL (ref 0.10–4.00)

## 2014-12-05 MED ORDER — CLOTRIMAZOLE-BETAMETHASONE 1-0.05 % EX CREA: 1.0000 "application " | TOPICAL_CREAM | Freq: Two times a day (BID) | CUTANEOUS | Status: DC

## 2014-12-05 NOTE — Patient Instructions (Addendum)
Get your blood work before you leave   Come back to the office in 6 months  Please schedule an appointment at the front desk    Come back fasting

## 2014-12-05 NOTE — Assessment & Plan Note (Addendum)
Td 2014 zostavax 2014 prevnar -- today Pneumonia shot 12-12   Never had a cscope Prostate cancer screening, normal DRE- PSA 2014, declined DRE today, will check a PSA. Labs -- CBC and PSA Discussed life style    Other issues: Chronic medical problems seem to be controlled Skin lesion at the right calf, skin cancer? Refer to dermatology Has a on and off rash, at the groin or axillary area, request a Lotrisone refill.  Return to clinic in 6 months

## 2014-12-05 NOTE — Progress Notes (Signed)
Subjective:    Patient ID: Randy Castro, male    DOB: April 10, 1951, 64 y.o.   MRN: [id number]  DOS:  12/05/2014 Type of visit - description : cpx, here w/ his wife Interval history: In addition to his CPX we address a number of other issues.    Review of Systems Constitutional: No fever, chills. No unexplained wt changes. No unusual sweats HEENT: No dental problems, ear discharge, facial swelling, voice changes. No eye discharge, redness or intolerance to light Respiratory: No wheezing or difficulty breathing. No cough , mucus production Cardiovascular: No CP, leg swelling or palpitations GI: no nausea, vomiting; developed mild diarrhea without blood in the stools this morning, thinks is a virus,   the rest of the family is also affected ; no  abdominal pain.  No blood in the stools. No dysphagia   Endocrine: No polyphagia, polyuria or polydipsia GU: No dysuria, gross hematuria, difficulty urinating. No urinary urgency or frequency. Musculoskeletal: No joint swellings or unusual aches or pains Skin: No change in the color of the skin, palor; occ rash ; also has a lesion at the right calf for one year, it won't heal.  Allergic, immunologic: No environmental allergies or food allergies Neurological: No dizziness or syncope. No headaches. No diplopia, slurred speech, motor deficits, facial numbness Hematological: No enlarged lymph nodes, easy bruising or bleeding Psychiatry: No suicidal ideas, hallucinations, behavior problems or confusion. No unusual/severe anxiety or depression.     Past Medical History  Diagnosis Date  . Hyperlipidemia   . HTN (hypertension)   . CAD (coronary artery disease)     a. NSTEMI 12/12: EF 40-45%. ION:GEXBMWU balloon PTCA + Promus DES x 1 to mid LAD;  b. 11/2012: Cath with Cutting balloon PTCA  LAD for ISR to LAD, EF 55%;  c. 12/2012 Cath/PCI: LAD stents patent, 80 ost Diag (jailed)->PTCA, LCX/RCA patent.  . Ischemic cardiomyopathy     a.  echo 08/06/11:  dist ant wall, apical and septal and infero-apical HK, mild LVH, EF 40%, mild LAE, PASP 34, asc aorta mildly dilated, mild TR;  b.  Echo (3/13) showed recovery of LV systolic function with EF 13-24%, grade I diastolic dysfunction, mild MR.   Marland Kitchen Exertional shortness of breath   . Type II diabetes mellitus dx in the 90s  . Arthritis     "knees probably" (12/22/2012)    Past Surgical History  Procedure Laterality Date  . Anterior cervical decomp/discectomy fusion  in the 90s  . Coronary angioplasty with stent placement  07/2011    "1" (12/22/2012)  . Coronary angioplasty  12/15/2012; 12/22/2012  . Left heart catheterization with coronary angiogram N/A 08/06/2011    Procedure: LEFT HEART CATHETERIZATION WITH CORONARY ANGIOGRAM;  Surgeon: Burnell Blanks, MD;  Location: [facility] CATH LAB;  Service: Cardiovascular;  Laterality: N/A;  . Left heart catheterization with coronary angiogram N/A 12/15/2012    Procedure: LEFT HEART CATHETERIZATION WITH CORONARY ANGIOGRAM;  Surgeon: Sherren Mocha, MD;  Location: [facility] CATH LAB;  Service: Cardiovascular;  Laterality: N/A;  . Percutaneous coronary stent intervention (pci-s) Right 12/15/2012    Procedure: PERCUTANEOUS CORONARY STENT INTERVENTION (PCI-S);  Surgeon: Sherren Mocha, MD;  Location: [facility] CATH LAB;  Service: Cardiovascular;  Laterality: Right;  . Left heart catheterization with coronary angiogram N/A 12/22/2012    Procedure: LEFT HEART CATHETERIZATION WITH CORONARY ANGIOGRAM;  Surgeon: Sherren Mocha, MD;  Location: [facility] CATH LAB;  Service: Cardiovascular;  Laterality: N/A;   Family History  Problem Relation Age of Onset  .  Colon cancer Neg Hx   . Prostate cancer Neg Hx   . Diabetes Mother   . Coronary artery disease Father   . Stroke Neg Hx   . Diabetes Sister   . Diabetes Brother   . Heart attack Father 74    History   Social History  . Marital Status: Married    Spouse Name: N/A  . Number of Children: 2   . Years of Education: N/A    Occupational History  . retired--maintenance tech    Social History Main Topics  . Smoking status: Former Smoker -- 1.00 packs/day for 25 years    Types: Cigarettes    Quit date: 04/12/1992  . Smokeless tobacco: Never Used  . Alcohol Use: Yes     Comment: 12/22/2012 "rarely maybe every 3-4 months, beer"  . Drug Use: No  . Sexual Activity: Yes   Other Topics Concern  . Not on file   Social History Narrative   Moved back to [place] from [place]-- pt and wife        Medication List       This list is accurate as of: 12/05/14  6:29 PM.  Always use your most recent med list.               acetaminophen 325 MG tablet  Commonly known as:  TYLENOL  Take 650 mg by mouth every 6 (six) hours as needed for pain.     aspirin EC 81 MG tablet  Take 81 mg by mouth daily.     atorvastatin 80 MG tablet  Commonly known as:  LIPITOR  Take 1 tablet (80 mg total) by mouth daily.     carvedilol 12.5 MG tablet  Commonly known as:  COREG  Take 1 tablet (12.5 mg total) by mouth 2 (two) times daily with a meal.     citalopram 20 MG tablet  Commonly known as:  CELEXA  TAKE 1 TABLET BY MOUTH EVERY DAY     clopidogrel 75 MG tablet  Commonly known as:  PLAVIX  Take 1 tablet (75 mg total) by mouth daily.     clotrimazole-betamethasone cream  Commonly known as:  LOTRISONE  Apply 1 application topically 2 (two) times daily.     famotidine 20 MG tablet  Commonly known as:  PEPCID  Take 20 mg by mouth 2 (two) times daily as needed for heartburn.     fenofibrate 145 MG tablet  Commonly known as:  TRICOR  Take 1 tablet (145 mg total) by mouth daily.     glucose blood test strip  Use as instructed     Insulin Glargine 300 UNIT/ML Sopn  Commonly known as:  TOUJEO SOLOSTAR  Inject 60 Units into the skin daily at 10 pm.     Insulin Pen Needle 32G X 4 MM Misc  Use 4x a day     INVOKANA 300 MG Tabs tablet  Generic drug:  canagliflozin  TAKE 1 TABLET BY MOUTH EVERY DAY  BEFORE BREAKFAST     lisinopril 10 MG tablet  Commonly known as:  PRINIVIL,ZESTRIL  Take 1 tablet (10 mg total) by mouth 2 (two) times daily.     MAGNESIUM PO  Take 1 tablet by mouth daily.     metFORMIN 500 MG 24 hr tablet  Commonly known as:  GLUCOPHAGE XR  Take 2 tablets (1,000 mg total) by mouth 2 (two) times daily with a meal.     multivitamin with  minerals Tabs tablet  Take 1 tablet by mouth daily.     nitroGLYCERIN 0.4 MG SL tablet  Commonly known as:  NITROSTAT  Place 1 tablet (0.4 mg total) under the tongue every 5 (five) minutes as needed for chest pain.     NOVOLOG FLEXPEN 100 UNIT/ML FlexPen  Generic drug:  insulin aspart  INJECT 40 UNITS SUBCUTANEOUSLY THREE TIMES DAILY WITH MEALS     spironolactone 25 MG tablet  Commonly known as:  ALDACTONE  TAKE ONE-HALF TABLET BY MOUTH EVERY DAY           Objective:   Physical Exam BP 124/76 mmHg  Pulse 66  Temp(Src) 98.2 F (36.8 C) (Oral)  Ht 6\' 2"  (1.88 m)  Wt 274 lb 8 oz (124.512 kg)  BMI 35.23 kg/m2  SpO2 96% General:   Well developed, well nourished . NAD.  Neck:  Full range of motion. Supple. No  thyromegaly , normal carotid pulse HEENT:  Normocephalic . Face symmetric, atraumatic Lungs:  CTA B Normal respiratory effort, no intercostal retractions, no accessory muscle use. Heart: RRR,  no murmur.  Abdomen:  Not distended, soft, non-tender. No rebound or rigidity. No mass,organomegaly; no bruit Rectal exam-- DECLINED Skin-- 1 cm, round, red, slightly elevated, slightly scaly/dry lesion at the right calf  Muscle skeletal: no pretibial edema bilaterally  Skin: Exposed areas without rash. Not pale. Not jaundice Neurologic:  alert & oriented X3.  Speech normal, gait appropriate for age and unassisted Strength symmetric and appropriate for age.  Psych: Cognition and judgment appear intact.  Cooperative with normal attention span and concentration.  Behavior appropriate. No anxious or depressed  appearing.        Assessment & Plan:

## 2014-12-05 NOTE — Progress Notes (Signed)
Pre visit review using our clinic review tool, if applicable. No additional management support is needed unless otherwise documented below in the visit note. 

## 2014-12-06 ENCOUNTER — Encounter: Payer: Self-pay | Admitting: Internal Medicine

## 2014-12-10 ENCOUNTER — Other Ambulatory Visit: Payer: Self-pay | Admitting: Internal Medicine

## 2014-12-11 ENCOUNTER — Ambulatory Visit (INDEPENDENT_AMBULATORY_CARE_PROVIDER_SITE_OTHER): Payer: BC Managed Care – PPO | Admitting: Internal Medicine

## 2014-12-11 VITALS — BP 134/86 | HR 64 | Temp 97.8°F | Wt 276.0 lb

## 2014-12-11 DIAGNOSIS — E118 Type 2 diabetes mellitus with unspecified complications: Secondary | ICD-10-CM | POA: Diagnosis not present

## 2014-12-11 LAB — HEMOGLOBIN A1C: Hgb A1c MFr Bld: 8.4 % — ABNORMAL HIGH (ref 4.6–6.5)

## 2014-12-11 NOTE — Patient Instructions (Signed)
Please stop at the lab.  Please continue: - Invokana 300 mg daily in am - Metformin XR 500 mg 3x a day - Toujeo 60 units at night - NovoLog - 3x a day: 25 units with a smaller meal 30 units with a regular meal 35 units with a large meal  Please return in 3 months with your sugar log.

## 2014-12-11 NOTE — Progress Notes (Signed)
Subjective:     Patient ID: Randy Castro, male   DOB: 25-Dec-1950, 64 y.o.   MRN: [id number]  HPI Randy Castro is a pleasant 64 y.o. man returning for f/u for DM2, dx 1998, uncontrolled, insulin dependent, with complications (CAD, s/p MI 07/2011, s/p PTCA with DES, s/p PTCA for in-stent restenosis 12/15/12; also ED). He is here with his wife who offers part of the hx. Last visit was 3 mo ago.  Since last visit, he started to improve his meals 1 mo ago >> more veggies, reducing portions. He reduced diet drinks. Sugars are greatly improved! He also feels better.  Reviewed HbA1c levels:  Lab Results  Component Value Date   HGBA1C 10.0* 09/11/2014   HGBA1C 9.8* 03/06/2014   HGBA1C 9.9* 11/02/2013   He is now on a regimen of: - Invokana 300 mg daily in am - Metformin XR 1000 mg 2x a day >> but decreased to 500 mg 3x a day b/c diarrhea - Toujeo 60 units at night - Mealtime NovoLog 35 units tid   We stopped Glipizide when we started mealtime insulin. We stopped Bydureon when we started mealtime insulin We stopped Januvia 100 in 12/2013. He was on JanuMet 50/1000 bid >> stopped 2/2 diarrhea, fatigue >> restarted but with same SEs >> stopped. He retired on 08/15/2013 >> less activity, increased weight. Sugars have greatly increased >> we added Invokana.  He checks his sugars approx 1-3/day - better in last 2-3 weeks: - am:  142-200 (260) >> 170-281 >> 188-252 >> 162-225, 255 >> 102, 145-217 >> 124-151, 188, some 200s - after b'fast: 367 >> 158 >> n/c >> 231, 315 >> n/c - before lunch: 197-266 (354) >> 166-334 >> 179-305 >> 180-310 >> 91, 129-244, 271 >> 74, 122-166 - 2h after lunch: 143 >> n/c - before dinner: 161-280 >> 152-248 (348 x1) >> 210-320 >> 111-229 >> 147-232 >> 76, 123-160, 188, 224 - bedtime: 235-250 >> 282 >> n/c >> 156-311 >> 209, 254, 289 >> n/c >> 89, 144, 205-250, 316 >> 124, 131 - nighttime: 3 lows 58-60s >> n/c No lows. He has hypoglycemia awareness at 45.  - no DR per last  eye exam on 04/04/2014 >> no DR. - No CKD. Last BUN/Cr:  Lab Results  Component Value Date   BUN 26* 10/24/2014   CREATININE 1.09 10/24/2014  He is on lisinopril. - No neuropathy per foot exam 01/01/2014. - He has HL: Lab Results  Component Value Date   CHOL 158 10/24/2014   HDL 29.40* 10/24/2014   LDLCALC 43 11/28/2012   LDLDIRECT 55.0 10/24/2014   TRIG 616.0* 10/24/2014   CHOLHDL 5 10/24/2014  He is on Lipitor He is on ASA 81. Foot exam normal 12/2013.  PMH:   Pt had a period off the meds for cost reasons and was restarted on DM meds in 03/2011. In 07/2011, he had a NSTEMI and had a DES placed. He was also dx with CHF, which is now improved (EF increased from 40-45% in 07/2011 to 55-60% in 10/2011). Sees Dr. Aundra Dubin with Cardiology. He had CP/SOB for few months >> saw Dr. Percival Spanish on 12/15/3012 >> heart catheterization >> PTCA for LAD instent restenosis >> feels much better after this.   I reviewed pt's medications, allergies, PMH, social hx, family hx, and changes were documented in the history of present illness. Otherwise, unchanged from my initial visit note.  Review of Systems Constitutional: no weight gain, no fatigue, no subjective hyperthermia/hypothermia Eyes: no blurry  vision, no xerophthalmia ENT: no sore throat, no nodules palpated in throat, no dysphagia/odynophagia, no hoarseness Cardiovascular: no CP/no palpitations/leg swelling Respiratory: no cough/SOB Gastrointestinal: no N/V/D/C/heartburn Musculoskeletal: muscle/joint aches Skin: no rashes Neurological: no tremors/numbness/tingling/dizziness  Objective:   Physical Exam There were no vitals taken for this visit. There is no weight on file to calculate BMI.  Wt Readings from Last 3 Encounters:  12/05/14 274 lb 8 oz (124.512 kg)  10/24/14 275 lb (124.739 kg)  09/11/14 273 lb (123.832 kg)   Constitutional: overweight, in NAD Eyes: PERRLA, EOMI, no exophthalmos ENT: moist mucous membranes, no  thyromegaly, no cervical lymphadenopathy Cardiovascular: RRR, No MRG Respiratory: CTA B Gastrointestinal: abdomen soft, NT, ND, BS+ Musculoskeletal: no deformities, strength intact in all 4 Skin: moist, warm, no rashes Neurological: no tremor with outstretched hands, DTR normal in all 4 Assessment:     1. DM2, insulin dependent, uncontrolled, with complications: - CAD, s/p NSTEMI 07/2011, with PTCA + DES (Dr. Aundra Dubin), s/p PTCA for in-stent restenosis 12/15/2012- CHF, EF 55-60% per 2D ECHO in 10/2011 - ED - no DR - no nephropathy    Plan:     Patient's sugars much improved after starting to improve his diet! No need to change the regimen for now, but I advised him to let me know if sugars start to decrease >> may need reduction in insulin doses. I congratulated the pt for his success with his diet and improved DM control. - I advised him to: Patient Instructions  Please stop at the lab.  Please continue: - Invokana 300 mg daily in am - Metformin XR increase to 500 mg 3x a day - Toujeo 60 units at night - NovoLog: 25 units with a smaller meal 30 units with a regular meal 35 units with a large meal  Please return in 3 months with your sugar log.   - check HbA1c today - UTD with eye exam - return in 3 months with his sugar log.   Office Visit on 12/11/2014  Component Date Value Ref Range Status  . Hgb A1c MFr Bld 12/11/2014 8.4* 4.6 - 6.5 % Final   Glycemic Control Guidelines for People with Diabetes:Non Diabetic:  <6%Goal of Therapy: <7%Additional Action Suggested:  >8%    HbA1c better! Despite only starting the diet 1 mo ago!

## 2014-12-12 ENCOUNTER — Encounter: Payer: Self-pay | Admitting: Internal Medicine

## 2014-12-25 ENCOUNTER — Other Ambulatory Visit (INDEPENDENT_AMBULATORY_CARE_PROVIDER_SITE_OTHER): Payer: BC Managed Care – PPO | Admitting: *Deleted

## 2014-12-25 DIAGNOSIS — E785 Hyperlipidemia, unspecified: Secondary | ICD-10-CM | POA: Diagnosis not present

## 2014-12-25 DIAGNOSIS — I2510 Atherosclerotic heart disease of native coronary artery without angina pectoris: Secondary | ICD-10-CM

## 2014-12-25 LAB — LIPID PANEL
CHOL/HDL RATIO: 6
Cholesterol: 195 mg/dL (ref 0–200)
HDL: 32 mg/dL — ABNORMAL LOW (ref >39.00)
Triglycerides: 426 mg/dL — ABNORMAL HIGH (ref 0.0–149.0)

## 2014-12-25 LAB — LDL CHOLESTEROL, DIRECT: Direct LDL: 90 mg/dL

## 2014-12-26 ENCOUNTER — Other Ambulatory Visit: Payer: Self-pay | Admitting: *Deleted

## 2014-12-26 DIAGNOSIS — E785 Hyperlipidemia, unspecified: Secondary | ICD-10-CM

## 2014-12-26 DIAGNOSIS — I2510 Atherosclerotic heart disease of native coronary artery without angina pectoris: Secondary | ICD-10-CM

## 2014-12-26 MED ORDER — OMEGA-3-ACID ETHYL ESTERS 1 G PO CAPS: 2.0000 g | ORAL_CAPSULE | Freq: Two times a day (BID) | ORAL | Status: DC

## 2015-01-03 ENCOUNTER — Telehealth: Payer: Self-pay

## 2015-01-03 NOTE — Telephone Encounter (Signed)
Prior auth for Lovaza faxed to [facility].

## 2015-01-04 ENCOUNTER — Other Ambulatory Visit: Payer: Self-pay | Admitting: Internal Medicine

## 2015-01-11 ENCOUNTER — Other Ambulatory Visit: Payer: Self-pay | Admitting: Internal Medicine

## 2015-01-11 ENCOUNTER — Other Ambulatory Visit: Payer: Self-pay | Admitting: Adult Health

## 2015-01-14 ENCOUNTER — Other Ambulatory Visit: Payer: Self-pay | Admitting: *Deleted

## 2015-01-14 MED ORDER — INSULIN ASPART 100 UNIT/ML FLEXPEN
25.0000 [IU] | PEN_INJECTOR | Freq: Three times a day (TID) | SUBCUTANEOUS | Status: DC
Start: 2015-01-14 — End: 2015-05-07

## 2015-01-14 MED ORDER — CANAGLIFLOZIN 300 MG PO TABS: ORAL_TABLET | ORAL | Status: DC

## 2015-01-28 ENCOUNTER — Other Ambulatory Visit: Payer: BC Managed Care – PPO

## 2015-02-03 ENCOUNTER — Other Ambulatory Visit: Payer: Self-pay | Admitting: Internal Medicine

## 2015-02-04 ENCOUNTER — Other Ambulatory Visit: Payer: Self-pay | Admitting: Cardiology

## 2015-02-04 ENCOUNTER — Encounter: Payer: Self-pay | Admitting: Cardiology

## 2015-02-05 NOTE — Telephone Encounter (Signed)
PA for Rx (Omega-3 acid ethyl esters) approved and on file (Case #: [id number]). Pharmacy states it has gone through and is ready for pickup today.   Notified patient via telephone. He is very appreciative and he will pick up Rx today.

## 2015-02-05 NOTE — Telephone Encounter (Signed)
-----   Message from Larey Dresser, MD sent at 02/04/2015 12:59 PM EDT ----- Could you please work on getting Randy Castro his fish oil for elevated triglycerides? Thanks.  Having problems with his insurance.

## 2015-02-21 ENCOUNTER — Other Ambulatory Visit (INDEPENDENT_AMBULATORY_CARE_PROVIDER_SITE_OTHER): Payer: BC Managed Care – PPO | Admitting: *Deleted

## 2015-02-21 ENCOUNTER — Other Ambulatory Visit: Payer: Self-pay | Admitting: Cardiology

## 2015-02-21 DIAGNOSIS — I2510 Atherosclerotic heart disease of native coronary artery without angina pectoris: Secondary | ICD-10-CM | POA: Diagnosis not present

## 2015-02-21 DIAGNOSIS — E785 Hyperlipidemia, unspecified: Secondary | ICD-10-CM | POA: Diagnosis not present

## 2015-02-21 DIAGNOSIS — I10 Essential (primary) hypertension: Secondary | ICD-10-CM

## 2015-02-21 LAB — LIPID PANEL
Cholesterol: 152 mg/dL (ref 0–200)
HDL: 31.3 mg/dL — AB (ref >39.00)
NonHDL: 120.7
Total CHOL/HDL Ratio: 5
Triglycerides: 320 mg/dL — ABNORMAL HIGH (ref 0.0–149.0)
VLDL: 64 mg/dL — AB (ref 0.0–40.0)

## 2015-02-21 LAB — LDL CHOLESTEROL, DIRECT: LDL DIRECT: 75 mg/dL

## 2015-02-21 LAB — BASIC METABOLIC PANEL
BUN: 16 mg/dL (ref 6–23)
CO2: 29 mEq/L (ref 19–32)
Calcium: 9.3 mg/dL (ref 8.4–10.5)
Chloride: 104 mEq/L (ref 96–112)
Creatinine, Ser: 0.94 mg/dL (ref 0.40–1.50)
GFR: 86.01 mL/min (ref >60.00)
Glucose, Bld: 167 mg/dL — ABNORMAL HIGH (ref 70–99)
POTASSIUM: 4 meq/L (ref 3.5–5.1)
Sodium: 139 mEq/L (ref 135–145)

## 2015-02-21 LAB — HEPATIC FUNCTION PANEL
ALT: 19 U/L (ref 0–53)
AST: 17 U/L (ref 0–37)
Albumin: 4 g/dL (ref 3.5–5.2)
Alkaline Phosphatase: 60 U/L (ref 39–117)
BILIRUBIN DIRECT: 0.1 mg/dL (ref 0.0–0.3)
BILIRUBIN TOTAL: 0.4 mg/dL (ref 0.2–1.2)
TOTAL PROTEIN: 6.8 g/dL (ref 6.0–8.3)

## 2015-02-21 NOTE — Addendum Note (Signed)
Addended by: Eulis Foster on: 02/21/2015 08:11 AM   Modules accepted: Orders

## 2015-02-21 NOTE — Addendum Note (Signed)
Addended by: Eulis Foster on: 02/21/2015 08:10 AM   Modules accepted: Orders

## 2015-03-07 ENCOUNTER — Other Ambulatory Visit: Payer: Self-pay | Admitting: Internal Medicine

## 2015-03-12 ENCOUNTER — Ambulatory Visit: Payer: BC Managed Care – PPO | Admitting: Internal Medicine

## 2015-04-25 ENCOUNTER — Other Ambulatory Visit: Payer: Self-pay | Admitting: Internal Medicine

## 2015-04-25 ENCOUNTER — Ambulatory Visit (INDEPENDENT_AMBULATORY_CARE_PROVIDER_SITE_OTHER): Payer: BC Managed Care – PPO | Admitting: Internal Medicine

## 2015-04-25 ENCOUNTER — Other Ambulatory Visit (INDEPENDENT_AMBULATORY_CARE_PROVIDER_SITE_OTHER): Payer: BC Managed Care – PPO | Admitting: *Deleted

## 2015-04-25 ENCOUNTER — Encounter: Payer: Self-pay | Admitting: Internal Medicine

## 2015-04-25 ENCOUNTER — Other Ambulatory Visit: Payer: Self-pay | Admitting: Cardiology

## 2015-04-25 VITALS — BP 122/62 | HR 59 | Temp 97.6°F | Resp 12 | Wt 280.6 lb

## 2015-04-25 DIAGNOSIS — E1151 Type 2 diabetes mellitus with diabetic peripheral angiopathy without gangrene: Secondary | ICD-10-CM | POA: Diagnosis not present

## 2015-04-25 DIAGNOSIS — E1159 Type 2 diabetes mellitus with other circulatory complications: Secondary | ICD-10-CM

## 2015-04-25 DIAGNOSIS — E1165 Type 2 diabetes mellitus with hyperglycemia: Secondary | ICD-10-CM

## 2015-04-25 LAB — POCT GLYCOSYLATED HEMOGLOBIN (HGB A1C): HEMOGLOBIN A1C: 10.6

## 2015-04-25 MED ORDER — INSULIN GLARGINE 300 UNIT/ML ~~LOC~~ SOPN: 70.0000 [IU] | PEN_INJECTOR | Freq: Every day | SUBCUTANEOUS | Status: DC

## 2015-04-25 NOTE — Progress Notes (Signed)
Subjective:     Patient ID: Randy Castro, male   DOB: [birth year], 64 y.o.   MRN: [id number]  HPI Mr. Vanderpol is a pleasant 64 y.o. man returning for f/u for DM2, dx 1998, uncontrolled, insulin dependent, with complications (CAD, s/p MI 07/2011, s/p PTCA with DES, s/p PTCA for in-stent restenosis 12/15/12; also ED). He is here with his wife who offers part of the hx. Last visit was 3 mo ago.  Before last visit, he started to improve his meals 1 mo ago >> more veggies, reducing portions. He reduced diet drinks >> sugars better and HbA1c decreased. This summer, he stopped watching his diet and exercise. He was eating out 75% time >> now eats more at home. He eats watermelon 2x a day - throughout the summer. His sugars are much worse!  Reviewed HbA1c levels:  Lab Results  Component Value Date   HGBA1C 8.4* 12/11/2014   HGBA1C 10.0* 09/11/2014   HGBA1C 9.8* 03/06/2014   He is now on a regimen of: - Invokana 300 mg daily in am - Metformin XR 1000 mg 2x a day >> but decreased to 500 mg 3x a day b/c diarrhea - Toujeo 60 units at night - Mealtime NovoLog 35 units tid   We stopped Glipizide when we started mealtime insulin. We stopped Bydureon when we started mealtime insulin We stopped Januvia 100 in 12/2013. He was on JanuMet 50/1000 bid >> stopped 2/2 diarrhea, fatigue >> restarted but with same SEs >> stopped. He retired on 08/15/2013 >> less activity, increased weight. Sugars have greatly increased >> we added Invokana.  He checks his sugars approx 1-3/day - much worse: - am:  170-281 >> 188-252 >> 162-225, 255 >> 102, 145-217 >> 124-151, 188, some 200s >> 192-315 - after b'fast: 367 >> 158 >> n/c >> 231, 315 >> n/c  - before lunch: 166-334 >> 179-305 >> 180-310 >> 91, 129-244, 271 >> 74, 122-166 >> 205-301 - 2h after lunch: 143 >> n/c - before dinner: 210-320 >> 111-229 >> 147-232 >> 76, 123-160, 188, 224 >> 172, 212-280 - bedtime: 282 >> n/c >> 156-311 >> 209, 254, 289 >> n/c >> 89, 144,  205-250, 316 >> 124, 131 >> 301 - nighttime: 3 lows 58-60s >> n/c No lows. He has hypoglycemia awareness at 75.  - no DR per last eye exam on 04/04/2014 >> no DR. - No CKD. Last BUN/Cr:  Lab Results  Component Value Date   BUN 16 02/21/2015   CREATININE 0.94 02/21/2015  He is on lisinopril. - No neuropathy per foot exam 01/01/2014. - He has HL: Lab Results  Component Value Date   CHOL 152 02/21/2015   HDL 31.30* 02/21/2015   LDLCALC 43 11/28/2012   LDLDIRECT 75.0 02/21/2015   TRIG 320.0* 02/21/2015   CHOLHDL 5 02/21/2015  He is on Lipitor He is on ASA 81. Foot exam normal 12/2013.  PMH:   Pt had a period off the meds for cost reasons and was restarted on DM meds in 03/2011. In 07/2011, he had a NSTEMI and had a DES placed. He was also dx with CHF, which is now improved (EF increased from 40-45% in 07/2011 to 55-60% in 10/2011). Sees Dr. Aundra Dubin with Cardiology. He had CP/SOB for few months >> saw Dr. Percival Spanish on 12/15/3012 >> heart catheterization >> PTCA for LAD instent restenosis >> feels much better after this.   I reviewed pt's medications, allergies, PMH, social hx, family hx, and changes were documented in the  history of present illness. Otherwise, unchanged from my initial visit note.  Review of Systems Constitutional: + weight gain, no fatigue, no subjective hyperthermia/hypothermia Eyes: no blurry vision, no xerophthalmia ENT: no sore throat, no nodules palpated in throat, no dysphagia/odynophagia, no hoarseness Cardiovascular: no CP/no palpitations/leg swelling Respiratory: no cough/SOB Gastrointestinal: no N/V/D/C/heartburn Musculoskeletal: muscle/joint aches Skin: no rashes Neurological: no tremors/numbness/tingling/dizziness  Objective:   Physical Exam BP 122/62 mmHg  Pulse 59  Temp(Src) 97.6 F (36.4 C) (Oral)  Resp 12  Wt 280 lb 9.6 oz (127.279 kg)  SpO2 95% Body mass index is 36.01 kg/(m^2).  Wt Readings from Last 3 Encounters:  04/25/15 280 lb 9.6  oz (127.279 kg)  12/12/14 276 lb (125.193 kg)  12/05/14 274 lb 8 oz (124.512 kg)   Constitutional: overweight, in NAD Eyes: PERRLA, EOMI, no exophthalmos ENT: moist mucous membranes, no thyromegaly, no cervical lymphadenopathy Cardiovascular: RRR, No MRG Respiratory: CTA B Gastrointestinal: abdomen soft, NT, ND, BS+ Musculoskeletal: no deformities, strength intact in all 4 Skin: moist, warm, no rashes Neurological: no tremor with outstretched hands, DTR normal in all 4 Assessment:     1. DM2, insulin dependent, uncontrolled, with complications: - CAD, s/p NSTEMI 07/2011, with PTCA + DES (Dr. Aundra Dubin), s/p PTCA for in-stent restenosis 12/15/2012- CHF, EF 55-60% per 2D ECHO in 10/2011 - ED - no DR - no nephropathy    Plan:     Patient's sugars were much improved after starting to improve his diet, however, they greatly worsened in the last 3 month as he did not pay attention to his diet and did not exercise.  I strongly recommended to get back on his diet. I recommended the myGI app to see what foods to avoid. Demonstrated use. Will also increase Toujeo. - I advised him to: Patient Instructions  Please continue: - Invokana 300 mg daily in am - Metformin XR 500 mg 3x a day - NovoLog: 25 units with a smaller meal 30 units with a regular meal 35 units with a large meal  Please increase Toujeo to 70 units.  Please return in 3 months with your sugar log.   Try to download the myGI app.  - check HbA1c today >> 10.6% (much worse) - needs a new eye exam - return in 3 months with his sugar log.

## 2015-04-25 NOTE — Patient Instructions (Addendum)
Please continue: - Invokana 300 mg daily in am - Metformin XR 500 mg 3x a day - NovoLog: 25 units with a smaller meal 30 units with a regular meal 35 units with a large meal  Please increase Toujeo to 70 units.  Please return in 3 months with your sugar log.   Try to download the myGI app.

## 2015-05-07 ENCOUNTER — Other Ambulatory Visit: Payer: Self-pay | Admitting: Internal Medicine

## 2015-05-20 ENCOUNTER — Other Ambulatory Visit: Payer: Self-pay | Admitting: Cardiology

## 2015-05-21 ENCOUNTER — Other Ambulatory Visit: Payer: Self-pay | Admitting: Cardiology

## 2015-05-23 ENCOUNTER — Other Ambulatory Visit: Payer: Self-pay | Admitting: Cardiology

## 2015-06-06 ENCOUNTER — Other Ambulatory Visit: Payer: Self-pay | Admitting: Cardiology

## 2015-06-07 ENCOUNTER — Ambulatory Visit (INDEPENDENT_AMBULATORY_CARE_PROVIDER_SITE_OTHER): Payer: BC Managed Care – PPO | Admitting: Internal Medicine

## 2015-06-07 ENCOUNTER — Encounter: Payer: Self-pay | Admitting: Internal Medicine

## 2015-06-07 VITALS — BP 124/82 | HR 63 | Temp 98.2°F | Ht 74.0 in | Wt 283.2 lb

## 2015-06-07 DIAGNOSIS — E118 Type 2 diabetes mellitus with unspecified complications: Secondary | ICD-10-CM

## 2015-06-07 DIAGNOSIS — I2510 Atherosclerotic heart disease of native coronary artery without angina pectoris: Secondary | ICD-10-CM | POA: Diagnosis not present

## 2015-06-07 DIAGNOSIS — Z23 Encounter for immunization: Secondary | ICD-10-CM | POA: Diagnosis not present

## 2015-06-07 DIAGNOSIS — F419 Anxiety disorder, unspecified: Secondary | ICD-10-CM

## 2015-06-07 DIAGNOSIS — F418 Other specified anxiety disorders: Secondary | ICD-10-CM | POA: Diagnosis not present

## 2015-06-07 DIAGNOSIS — Z09 Encounter for follow-up examination after completed treatment for conditions other than malignant neoplasm: Secondary | ICD-10-CM

## 2015-06-07 DIAGNOSIS — Z1159 Encounter for screening for other viral diseases: Secondary | ICD-10-CM

## 2015-06-07 DIAGNOSIS — I10 Essential (primary) hypertension: Secondary | ICD-10-CM

## 2015-06-07 DIAGNOSIS — Z114 Encounter for screening for human immunodeficiency virus [HIV]: Secondary | ICD-10-CM

## 2015-06-07 DIAGNOSIS — F329 Major depressive disorder, single episode, unspecified: Secondary | ICD-10-CM

## 2015-06-07 DIAGNOSIS — I25709 Atherosclerosis of coronary artery bypass graft(s), unspecified, with unspecified angina pectoris: Secondary | ICD-10-CM

## 2015-06-07 LAB — BASIC METABOLIC PANEL
BUN: 26 mg/dL — AB (ref 6–23)
CALCIUM: 9.8 mg/dL (ref 8.4–10.5)
CHLORIDE: 101 meq/L (ref 96–112)
CO2: 28 mEq/L (ref 19–32)
Creatinine, Ser: 1.02 mg/dL (ref 0.40–1.50)
GFR: 78.2 mL/min (ref >60.00)
GLUCOSE: 278 mg/dL — AB (ref 70–99)
POTASSIUM: 4.9 meq/L (ref 3.5–5.1)
SODIUM: 137 meq/L (ref 135–145)

## 2015-06-07 NOTE — Progress Notes (Signed)
Subjective:    Patient ID: Randy Castro, male    DOB: 1950-09-24, 64 y.o.   MRN: [id number]  DOS:  06/07/2015 Type of visit - description :  Interval history: DM: Per Dr. Cruzita Lederer , diet needs improvement, was unable to exercise during the summer due to the hot weather. Not using insulin as prescribed CAD, cardiomyopathy: Saw cardiology-2016, they recommended a BMP every 4 months at least. Anxiety depression: Currently well controlled Was referred to dermatology for a skin lesion, it turned out to be a skin cancer. Lesion  was completely removed.  Review of Systems Denies chest pain, difficulty breathing or lower extremity edema No nausea, vomiting. Occasional diarrhea without blood in the stools.  Past Medical History  Diagnosis Date  . Hyperlipidemia   . HTN (hypertension)   . CAD (coronary artery disease)     a. NSTEMI 12/12: EF 40-45%. ONG:EXBMWUX balloon PTCA + Promus DES x 1 to mid LAD;  b. 11/2012: Cath with Cutting balloon PTCA  LAD for ISR to LAD, EF 55%;  c. 12/2012 Cath/PCI: LAD stents patent, 80 ost Diag (jailed)->PTCA, LCX/RCA patent.  . Ischemic cardiomyopathy     a.  echo 08/06/11: dist ant wall, apical and septal and infero-apical HK, mild LVH, EF 40%, mild LAE, PASP 34, asc aorta mildly dilated, mild TR;  b.  Echo (3/13) showed recovery of LV systolic function with EF 32-44%, grade I diastolic dysfunction, mild MR.   Marland Kitchen Exertional shortness of breath   . Type II diabetes mellitus (HCC) dx in the 90s  . Arthritis     "knees probably" (12/22/2012)    Past Surgical History  Procedure Laterality Date  . Anterior cervical decomp/discectomy fusion  in the 90s  . Coronary angioplasty with stent placement  07/2011    "1" (12/22/2012)  . Coronary angioplasty  12/15/2012; 12/22/2012  . Left heart catheterization with coronary angiogram N/A 08/06/2011    Procedure: LEFT HEART CATHETERIZATION WITH CORONARY ANGIOGRAM;  Surgeon: Burnell Blanks, MD;  Location: [facility] CATH LAB;   Service: Cardiovascular;  Laterality: N/A;  . Left heart catheterization with coronary angiogram N/A 12/15/2012    Procedure: LEFT HEART CATHETERIZATION WITH CORONARY ANGIOGRAM;  Surgeon: Sherren Mocha, MD;  Location: [facility] CATH LAB;  Service: Cardiovascular;  Laterality: N/A;  . Percutaneous coronary stent intervention (pci-s) Right 12/15/2012    Procedure: PERCUTANEOUS CORONARY STENT INTERVENTION (PCI-S);  Surgeon: Sherren Mocha, MD;  Location: [facility] CATH LAB;  Service: Cardiovascular;  Laterality: Right;  . Left heart catheterization with coronary angiogram N/A 12/22/2012    Procedure: LEFT HEART CATHETERIZATION WITH CORONARY ANGIOGRAM;  Surgeon: Sherren Mocha, MD;  Location: [facility] CATH LAB;  Service: Cardiovascular;  Laterality: N/A;    Social History   Social History  . Marital Status: Married    Spouse Name: N/A  . Number of Children: 2   . Years of Education: N/A   Occupational History  . retired--maintenance tech    Social History Main Topics  . Smoking status: Former Smoker -- 1.00 packs/day for 25 years    Types: Cigarettes    Quit date: 04/12/1992  . Smokeless tobacco: Never Used  . Alcohol Use: Yes     Comment: 12/22/2012 "rarely maybe every 3-4 months, beer"  . Drug Use: No  . Sexual Activity: Yes   Other Topics Concern  . Not on file   Social History Narrative   Moved back to [place] from [place]-- pt and wife  Medication List       This list is accurate as of: 06/07/15 11:59 PM.  Always use your most recent med list.               acetaminophen 325 MG tablet  Commonly known as:  TYLENOL  Take 650 mg by mouth every 6 (six) hours as needed for pain.     aspirin EC 81 MG tablet  Take 81 mg by mouth daily.     atorvastatin 80 MG tablet  Commonly known as:  LIPITOR  TAKE 1 TABLET BY MOUTH EVERY DAY     BD PEN NEEDLE NANO U/F 32G X 4 MM Misc  Generic drug:  Insulin Pen Needle  USE FOR INJECTIONS FOUR TIMES DAILY     carvedilol 12.5 MG tablet    Commonly known as:  COREG  TAKE 1 TABLET BY MOUTH TWICE DAILY WITH A MEAL     citalopram 20 MG tablet  Commonly known as:  CELEXA  Take 1 tablet (20 mg total) by mouth daily.     clopidogrel 75 MG tablet  Commonly known as:  PLAVIX  TAKE 1 TABLET BY MOUTH EVERY DAY     clotrimazole-betamethasone cream  Commonly known as:  LOTRISONE  Apply 1 application topically 2 (two) times daily.     famotidine 20 MG tablet  Commonly known as:  PEPCID  Take 20 mg by mouth 2 (two) times daily as needed for heartburn.     fenofibrate 145 MG tablet  Commonly known as:  TRICOR  TAKE 1 TABLET BY MOUTH EVERY DAY     glucose blood test strip  Use as instructed     Insulin Glargine 300 UNIT/ML Sopn  Commonly known as:  TOUJEO SOLOSTAR  Inject 70 Units into the skin daily at 10 pm.     INVOKANA 300 MG Tabs tablet  Generic drug:  canagliflozin  TAKE 1 TABLET BY MOUTH EVERY DAY BEFORE BREAKFAST     lisinopril 10 MG tablet  Commonly known as:  PRINIVIL,ZESTRIL  Take 1 tablet (10 mg total) by mouth 2 (two) times daily.     MAGNESIUM PO  Take 1 tablet by mouth daily.     metFORMIN 500 MG 24 hr tablet  Commonly known as:  GLUCOPHAGE-XR  Take 1 tablet (500 mg total) by mouth 3 (three) times daily.     multivitamin with minerals Tabs tablet  Take 1 tablet by mouth daily.     nitroGLYCERIN 0.4 MG SL tablet  Commonly known as:  NITROSTAT  Place 1 tablet (0.4 mg total) under the tongue every 5 (five) minutes as needed for chest pain.     NOVOLOG FLEXPEN 100 UNIT/ML FlexPen  Generic drug:  insulin aspart  ADMINISTER 25 TO 35 UNITS UNDER THE SKIN THREE TIMES DAILY WITH MEALS     omega-3 acid ethyl esters 1 G capsule  Commonly known as:  LOVAZA  Take 2 capsules (2 g total) by mouth 2 (two) times daily.     spironolactone 25 MG tablet  Commonly known as:  ALDACTONE  TAKE 1/2 TABLET BY MOUTH EVERY DAY           Objective:   Physical Exam BP 124/82 mmHg  Pulse 63  Temp(Src) 98.2 F  (36.8 C) (Oral)  Ht 6\' 2"  (1.88 m)  Wt 283 lb 4 oz (128.481 kg)  BMI 36.35 kg/m2  SpO2 95% General:   Well developed, well nourished . NAD.  HEENT:  Normocephalic .  Face symmetric, atraumatic Lungs:  CTA B Normal respiratory effort, no intercostal retractions, no accessory muscle use. Heart: RRR,  no murmur.  no pretibial edema bilaterally  Abdomen:  Not distended, soft, non-tender. No rebound or rigidity. No mass,organomegaly Skin: Not pale. Not jaundice Neurologic:  alert & oriented X3.  Speech normal, gait appropriate for age and unassisted Psych--  Cognition and judgment appear intact.  Cooperative with normal attention span and concentration.  Behavior appropriate. No anxious or depressed appearing.    Assessment & Plan:    Assessment > DM --Dr Cruzita Lederer HTN Hyperlipidemia CAD Ischemic cardiomyopathy Anxiety-depression Skin cancer: Right leg, status post excision 2016  Plan DM: Poorly controlled, has decrease insulin to 60 units daily d/t dizziness ; still taking NovoLog 3 times a day. Risks of increased CBGs , role of diet exercise >> discussed ; encourage increased physical activity, reports he has enough information about a healthy diet. Will notify endocrinology about the problems with insulin (taking less than prescribed ). Encouraged to see eye doctor yearly HTN: Seems well-controlled Cardiomyopathy: Symptoms well-controlled check a BMP Anxiety and depression: On citalopram, symptoms controlled Primary care: Flu shot today; check hep C and HIV RTC 6 months for a CPX

## 2015-06-07 NOTE — Patient Instructions (Addendum)
Get your blood work before you leave     Next visit  for a  physical exam in 6 months, fasting Please schedule an appointment at the front desk  

## 2015-06-07 NOTE — Progress Notes (Signed)
Pre visit review using our clinic review tool, if applicable. No additional management support is needed unless otherwise documented below in the visit note. 

## 2015-06-08 DIAGNOSIS — Z09 Encounter for follow-up examination after completed treatment for conditions other than malignant neoplasm: Secondary | ICD-10-CM | POA: Insufficient documentation

## 2015-06-08 LAB — HEPATITIS C ANTIBODY: HCV Ab: NEGATIVE

## 2015-06-08 LAB — HIV ANTIBODY (ROUTINE TESTING W REFLEX): HIV 1&2 Ab, 4th Generation: NONREACTIVE

## 2015-06-08 NOTE — Assessment & Plan Note (Signed)
DM: Poorly controlled, has decrease insulin to 60 units daily d/t dizziness ; still taking NovoLog 3 times a day. Risks of increased CBGs , role of diet exercise >> discussed ; encourage increased physical activity, reports he has enough information about a healthy diet. Will notify endocrinology about the problems with insulin (taking less than prescribed ). Encouraged to see eye doctor yearly HTN: Seems well-controlled Cardiomyopathy: Symptoms well-controlled check a BMP Anxiety and depression: On citalopram, symptoms controlled Primary care: Flu shot today; check hep C and HIV RTC 6 months for a CPX

## 2015-06-14 ENCOUNTER — Other Ambulatory Visit: Payer: Self-pay | Admitting: Cardiology

## 2015-06-23 ENCOUNTER — Other Ambulatory Visit: Payer: Self-pay | Admitting: Internal Medicine

## 2015-07-25 ENCOUNTER — Ambulatory Visit (INDEPENDENT_AMBULATORY_CARE_PROVIDER_SITE_OTHER): Payer: BC Managed Care – PPO | Admitting: Internal Medicine

## 2015-07-25 ENCOUNTER — Encounter: Payer: Self-pay | Admitting: Internal Medicine

## 2015-07-25 ENCOUNTER — Other Ambulatory Visit (INDEPENDENT_AMBULATORY_CARE_PROVIDER_SITE_OTHER): Payer: BC Managed Care – PPO | Admitting: *Deleted

## 2015-07-25 VITALS — BP 110/64 | HR 72 | Temp 97.5°F | Resp 12 | Wt 286.0 lb

## 2015-07-25 DIAGNOSIS — E1165 Type 2 diabetes mellitus with hyperglycemia: Secondary | ICD-10-CM | POA: Diagnosis not present

## 2015-07-25 DIAGNOSIS — E1159 Type 2 diabetes mellitus with other circulatory complications: Secondary | ICD-10-CM | POA: Diagnosis not present

## 2015-07-25 LAB — POCT GLYCOSYLATED HEMOGLOBIN (HGB A1C): HEMOGLOBIN A1C: 9.4

## 2015-07-25 MED ORDER — EXENATIDE ER 2 MG ~~LOC~~ PEN: PEN_INJECTOR | SUBCUTANEOUS | Status: DC

## 2015-07-25 MED ORDER — GLUCOSE BLOOD VI STRP: ORAL_STRIP | Status: DC

## 2015-07-25 NOTE — Progress Notes (Signed)
Subjective:     Patient ID: Randy Castro, male   DOB: October 05, 1950, 64 y.o.   MRN: [id number]  HPI Randy Castro is a pleasant 64 y.o. man returning for f/u for DM2, dx 1998, uncontrolled, insulin dependent, with complications (CAD, s/p MI 07/2011, s/p PTCA with DES, s/p PTCA for in-stent restenosis 12/15/12; also ED). He is here with his wife who offers part of the hx. Last visit was 3 mo ago.  Over the summer, he started to improve his meals >> more veggies, reducing portions, reduced diet drinks, reduced eating out >> sugars better and HbA1c decreased. However, at last visit, he fell off the wagon, and his sugars greatly worsened, so his A1c was very high. He had a long discussion about the absolute need to improve his diet. We also increased his Toujeo at that time.  Reviewed HbA1c levels:  Lab Results  Component Value Date   HGBA1C 10.6 04/25/2015   HGBA1C 8.4* 12/11/2014   HGBA1C 10.0* 09/11/2014   He is now on a regimen of: - Invokana 300 mg daily in am - Metformin XR 1000 mg 2x a day >> but decreased to 500 mg 3x a day b/c diarrhea >> now 500 mg 2x a day - Toujeo 60 units at bedtime (had dizziness with 70 units) - Mealtime NovoLog 35 units tid   We stopped Glipizide when we started mealtime insulin. We stopped Bydureon when we started mealtime insulin We stopped Januvia 100 in 12/2013. He was on JanuMet 50/1000 bid >> stopped 2/2 diarrhea, fatigue >> restarted but with same SEs >> stopped. He retired on 08/15/2013 >> less activity, increased weight. Sugars have greatly increased >> we added Invokana.  He checks his sugars approx 1-3/day: - am:  170-281 >> 188-252 >> 162-225, 255 >> 102, 145-217 >> 124-151, 188, some 200s >> 192-315 >> 159-241, 272, 287 - after b'fast: 367 >> 158 >> n/c >> 231, 315 >> n/c  - before lunch: 166-334 >> 179-305 >> 180-310 >> 91, 129-244, 271 >> 74, 122-166 >> 205-301 >> 184-268, 314 - 2h after lunch: 143 >> n/c - before dinner: 210-320 >> 111-229 >>  147-232 >> 76, 123-160, 188, 224 >> 172, 212-280 >> 142-242 - bedtime: 282 >> n/c >> 156-311 >> 209, 254, 289 >> n/c >> 89, 144, 205-250, 316 >> 124, 131 >> 301 >> 178, 240 - nighttime: 3 lows 58-60s >> n/c No lows. He has hypoglycemia awareness at 69.  - no DR per last eye exam on 04/04/2014 >> no DR. - No CKD. Last BUN/Cr:  Lab Results  Component Value Date   BUN 26* 06/07/2015   CREATININE 1.02 06/07/2015  He is on lisinopril. - No neuropathy per foot exam 04/04/2014. - He has HL: Lab Results  Component Value Date   CHOL 152 02/21/2015   HDL 31.30* 02/21/2015   LDLCALC 43 11/28/2012   LDLDIRECT 75.0 02/21/2015   TRIG 320.0* 02/21/2015   CHOLHDL 5 02/21/2015  He is on Lipitor He is on ASA 81. Foot exam normal 12/2013.  PMH:   Pt had a period off the meds for cost reasons and was restarted on DM meds in 03/2011. In 07/2011, he had a NSTEMI and had a DES placed. He was also dx with CHF, which is now improved (EF increased from 40-45% in 07/2011 to 55-60% in 10/2011). Sees Dr. Aundra Dubin with Cardiology. He had CP/SOB >> saw Dr. Percival Spanish on 12/15/3012 >> heart catheterization >> PTCA for LAD instent restenosis >> feels  much better after this.   I reviewed pt's medications, allergies, PMH, social hx, family hx, and changes were documented in the history of present illness. Otherwise, unchanged from my initial visit note.  Review of Systems Constitutional: + weight gain, no fatigue, no subjective hyperthermia/hypothermia Eyes: no blurry vision, no xerophthalmia ENT: no sore throat, no nodules palpated in throat, no dysphagia/odynophagia, no hoarseness Cardiovascular: no CP/no palpitations/leg swelling Respiratory: no cough/SOB Gastrointestinal: no N/V/D/C/heartburn Musculoskeletal: muscle/joint aches Skin: no rashes Neurological: no tremors/numbness/tingling/dizziness  Objective:   Physical Exam BP 110/64 mmHg  Pulse 72  Temp(Src) 97.5 F (36.4 C) (Oral)  Resp 12  Wt 286 lb  (129.729 kg)  SpO2 95% Body mass index is 36.7 kg/(m^2).  Wt Readings from Last 3 Encounters:  07/25/15 286 lb (129.729 kg)  06/07/15 283 lb 4 oz (128.481 kg)  04/25/15 280 lb 9.6 oz (127.279 kg)   Constitutional: overweight, in NAD Eyes: PERRLA, EOMI, no exophthalmos ENT: moist mucous membranes, no thyromegaly, no cervical lymphadenopathy Cardiovascular: RRR, No MRG Respiratory: CTA B Gastrointestinal: abdomen soft, NT, ND, BS+ Musculoskeletal: no deformities, strength intact in all 4 Skin: moist, warm, no rashes Neurological: no tremor with outstretched hands, DTR normal in all 4 Assessment:     1. DM2, insulin dependent, uncontrolled, with complications: - CAD, s/p NSTEMI 07/2011, with PTCA + DES (Dr. Aundra Dubin), s/p PTCA for in-stent restenosis 12/15/2012- CHF, EF 55-60% per 2D ECHO in 10/2011 - ED - no DR - no nephropathy    Plan:     Patient's sugars were much improved after starting to improve his diet over the summer, however, they worsened before last visit as he did not pay attention to his diet and did not exercise.  At last visit, I strongly recommended to get back on his diet and I recommended the myGI app to see what foods to avoid. His sugars are better, but still high. We will try to add back Bydureon to help with appetite and weight. - I advised him to: Patient Instructions  Please continue: - Invokana 300 mg daily in am - Metformin XR 500 mg 3x a day - Toujeo 60 units at bedtime - NovoLog: 25 units with a smaller meal 30 units with a regular meal 35 units with a large meal  Please add Bydureaon 2 mg once a week.  Please return in 3 months with your sugar log.   - check HbA1c today >> 9.4% (lower) - needs a new eye exam >> advised to schedule - UTD with flu shot - return in 3 months with his sugar log.

## 2015-07-25 NOTE — Patient Instructions (Addendum)
Please continue: - Invokana 300 mg daily in am - Metformin XR 500 mg 3x a day - Toujeo 60 units at bedtime - NovoLog: 25 units with a smaller meal 30 units with a regular meal 35 units with a large meal  Please add Bydureaon 2 mg once a week.  Please return in 3 months with your sugar log.

## 2015-07-28 ENCOUNTER — Other Ambulatory Visit: Payer: Self-pay | Admitting: Internal Medicine

## 2015-07-28 ENCOUNTER — Other Ambulatory Visit: Payer: Self-pay | Admitting: Cardiology

## 2015-07-29 NOTE — Telephone Encounter (Signed)
Larey Dresser, MD at 10/24/2014 10:36 PM  carvedilol (COREG) 12.5 MG tabletTake 1 tablet (12.5 mg total) by mouth 2 (two) times daily with a meal Coronary Artery Disease  No current ischemic symptoms. Continue ASA, Plavix, ACEI, beta blocker, statin. I will continue plavix long-term.

## 2015-07-30 ENCOUNTER — Other Ambulatory Visit: Payer: Self-pay | Admitting: *Deleted

## 2015-07-30 MED ORDER — METFORMIN HCL ER 500 MG PO TB24: 500.0000 mg | ORAL_TABLET | Freq: Three times a day (TID) | ORAL | Status: DC

## 2015-08-03 ENCOUNTER — Other Ambulatory Visit: Payer: Self-pay | Admitting: Internal Medicine

## 2015-08-22 ENCOUNTER — Other Ambulatory Visit: Payer: Self-pay | Admitting: Internal Medicine

## 2015-08-23 ENCOUNTER — Other Ambulatory Visit: Payer: Self-pay | Admitting: Internal Medicine

## 2015-09-05 ENCOUNTER — Other Ambulatory Visit: Payer: Self-pay | Admitting: Internal Medicine

## 2015-09-18 ENCOUNTER — Other Ambulatory Visit: Payer: Self-pay | Admitting: *Deleted

## 2015-09-18 MED ORDER — EMPAGLIFLOZIN 25 MG PO TABS: 25.0000 mg | ORAL_TABLET | Freq: Every day | ORAL | Status: DC

## 2015-09-18 NOTE — Telephone Encounter (Signed)
Ins would not cover Invokana. Switching to Jardiance 25 mg per Dr Cruzita Lederer.

## 2015-09-19 ENCOUNTER — Other Ambulatory Visit: Payer: Self-pay | Admitting: *Deleted

## 2015-09-19 ENCOUNTER — Telehealth: Payer: Self-pay | Admitting: Internal Medicine

## 2015-09-19 MED ORDER — DULAGLUTIDE 1.5 MG/0.5ML ~~LOC~~ SOAJ: 1.5000 mg | SUBCUTANEOUS | Status: DC

## 2015-09-19 NOTE — Telephone Encounter (Signed)
Ins will not cover Bydureon pen. Switching to Trulicity per Dr Cruzita Lederer.

## 2015-09-19 NOTE — Telephone Encounter (Signed)
error 

## 2015-09-24 ENCOUNTER — Other Ambulatory Visit: Payer: Self-pay | Admitting: *Deleted

## 2015-09-24 MED ORDER — INSULIN GLARGINE 100 UNIT/ML SOLOSTAR PEN: 60.0000 [IU] | PEN_INJECTOR | Freq: Every day | SUBCUTANEOUS | Status: DC

## 2015-09-24 NOTE — Telephone Encounter (Signed)
Ins will not cover Toujeo. They will cover Basaglar. Sent Basaglar per Dr Cruzita Lederer.

## 2015-09-26 ENCOUNTER — Telehealth: Payer: Self-pay

## 2015-09-26 NOTE — Telephone Encounter (Signed)
PA for Bydureon was denied.   Per Dr. Cruzita Lederer, change to Trulicity 1.5 mg weekly.   Med list has been updated. Rx was sent on 09/19/15.

## 2015-10-23 ENCOUNTER — Ambulatory Visit (INDEPENDENT_AMBULATORY_CARE_PROVIDER_SITE_OTHER): Payer: BC Managed Care – PPO | Admitting: Internal Medicine

## 2015-10-23 ENCOUNTER — Other Ambulatory Visit (INDEPENDENT_AMBULATORY_CARE_PROVIDER_SITE_OTHER): Payer: BC Managed Care – PPO | Admitting: *Deleted

## 2015-10-23 ENCOUNTER — Encounter: Payer: Self-pay | Admitting: Internal Medicine

## 2015-10-23 VITALS — BP 112/60 | HR 74 | Temp 97.6°F | Resp 12 | Wt 278.6 lb

## 2015-10-23 DIAGNOSIS — E1159 Type 2 diabetes mellitus with other circulatory complications: Secondary | ICD-10-CM | POA: Diagnosis not present

## 2015-10-23 DIAGNOSIS — E1165 Type 2 diabetes mellitus with hyperglycemia: Secondary | ICD-10-CM

## 2015-10-23 LAB — POCT GLYCOSYLATED HEMOGLOBIN (HGB A1C): HEMOGLOBIN A1C: 8.2

## 2015-10-23 NOTE — Patient Instructions (Signed)
Please increase Toujeo to 60 units.  Continue: - Metformin ER 500 mg 2x a day - Jardiance 25 mg in am - NovoLog: 25 units with a smaller meal 35 units with a large meal  KEEP UP THE GOOD JOB!!!!!!!  Please come back for a follow-up appointment in 3 months.

## 2015-10-23 NOTE — Progress Notes (Signed)
Subjective:     Patient ID: Randy Castro, male   DOB: [birth year], 65 y.o.   MRN: [id number]  HPI Randy Castro is a pleasant 65 y.o. man returning for f/u for DM2, dx 1998, uncontrolled, insulin dependent, with complications (CAD, s/p MI 07/2011, s/p PTCA with DES, s/p PTCA for in-stent restenosis 12/15/12; also ED). He is here with his wife who offers part of the hx. Last visit was 3 mo ago.  He lost 8 lbs since last visit! He started to change his diet 1 mo ago.   Reviewed HbA1c levels:  Lab Results  Component Value Date   HGBA1C 9.4 07/25/2015   HGBA1C 10.6 04/25/2015   HGBA1C 8.4* 12/11/2014   He is now on a regimen of: - Invokana 300 mg daily in am >> Jardiance 25 mg daily in am - Metformin XR 1000 mg 2x a day >> diarrhea >> 500 mg 2x a day - Toujeo 60 >> 50 units at bedtime - Mealtime NovoLog 2-3x a day: 25 units with a smaller meal 30 units with a regular meal 35 units with a large meal - mostly He was on Trulicity 2 mg weekly (started 07/2015) >> sick >> he had to stop.  We stopped Glipizide when we started mealtime insulin. We stopped Bydureon when we started mealtime insulin We stopped Januvia 100 in 12/2013. He was on JanuMet 50/1000 bid >> stopped 2/2 diarrhea, fatigue >> restarted but with same SEs >> stopped. He retired on 08/15/2013 >> less activity, increased weight. Sugars have greatly increased >> we added Invokana.  He checks his sugars approx 1-3/day: - am: 162-225, 255 >> 102, 145-217 >> 124-151, 188, some 200s >> 192-315 >> 159-241, 272, 287 >> 155-179 - after b'fast: 367 >> 158 >> n/c >> 231, 315 >> n/c  - before lunch: 179-305 >> 180-310 >> 91, 129-244, 271 >> 74, 122-166 >> 205-301 >> 184-268, 314 >> 123-223 - 2h after lunch: 143 >> n/c - before dinner: 111-229 >> 147-232 >> 76, 123-160, 188, 224 >> 172, 212-280 >> 142-242 >> 142, 178, 253 - bedtime: 209, 254, 289 >> n/c >> 89, 144, 205-250, 316 >> 124, 131 >> 301 >> 178, 240 - nighttime: 3 lows 58-60s >>  n/c No lows. He has hypoglycemia awareness at 57.  - no DR per last eye exam on 04/04/2014 >> no DR. - No CKD. Last BUN/Cr:  Lab Results  Component Value Date   BUN 26* 06/07/2015   CREATININE 1.02 06/07/2015  He is on lisinopril. - No neuropathy per foot exam 04/04/2014. - He has HL: Lab Results  Component Value Date   CHOL 152 02/21/2015   HDL 31.30* 02/21/2015   LDLCALC 43 11/28/2012   LDLDIRECT 75.0 02/21/2015   TRIG 320.0* 02/21/2015   CHOLHDL 5 02/21/2015  He is on Lipitor He is on ASA 81. Foot exam normal 12/2013.  PMH:   Pt had a period off the meds for cost reasons and was restarted on DM meds in 03/2011. In 07/2011, he had a NSTEMI and had a DES placed. He also has CHF, improved (EF increased from 40-45% in 07/2011 to 55-60% in 10/2011). Sees Dr. Aundra Dubin with Cardiology. He had CP/SOB >> saw Dr. Percival Spanish on 12/15/3012 >> heart catheterization >> PTCA for LAD instent restenosis >> feels much better after this.   I reviewed pt's medications, allergies, PMH, social hx, family hx, and changes were documented in the history of present illness. Otherwise, unchanged from my initial visit note.  Review of Systems Constitutional: + weight loss, no fatigue, no subjective hyperthermia/hypothermia Eyes: no blurry vision, no xerophthalmia ENT: no sore throat, no nodules palpated in throat, no dysphagia/odynophagia, no hoarseness Cardiovascular: no CP/no palpitations/leg swelling Respiratory: no cough/SOB Gastrointestinal: no N/V/D/C/heartburn Musculoskeletal: muscle/joint aches Skin: no rashes Neurological: no tremors/numbness/tingling/dizziness  Objective:   Physical Exam BP 112/60 mmHg  Pulse 74  Temp(Src) 97.6 F (36.4 C) (Oral)  Resp 12  Wt 278 lb 9.6 oz (126.372 kg)  SpO2 94% Body mass index is 35.75 kg/(m^2).  Wt Readings from Last 3 Encounters:  10/23/15 278 lb 9.6 oz (126.372 kg)  07/25/15 286 lb (129.729 kg)  06/07/15 283 lb 4 oz (128.481 kg)    Constitutional: overweight, in NAD Eyes: PERRLA, EOMI, no exophthalmos ENT: moist mucous membranes, no thyromegaly, no cervical lymphadenopathy Cardiovascular: RRR, No MRG Respiratory: CTA B Gastrointestinal: abdomen soft, NT, ND, BS+ Musculoskeletal: no deformities, strength intact in all 4 Skin: moist, warm, no rashes Neurological: no tremor with outstretched hands, DTR normal in all 4 Assessment:     1. DM2, insulin dependent, uncontrolled, with complications: - CAD, s/p NSTEMI 07/2011, with PTCA + DES (Dr. Aundra Dubin), s/p PTCA for in-stent restenosis 12/15/2012- CHF, EF 55-60% per 2D ECHO in 10/2011 - ED - no DR - no nephropathy    Plan:     Patient's sugars were much improved after starting to improve his diet! He also lost weight and feels better. His sugars are still high >> will increase back Toujeo. He could not tolerate Trulicity >> N/V. - I advised him to: Patient Instructions  Please increase Toujeo to 60 units.  Continue: - Metformin ER 500 mg 2x a day - Jardiance 25 mg in am - NovoLog: 25 units with a smaller meal 35 units with a large meal  KEEP UP THE GOOD JOB!!!!!!!  Please come back for a follow-up appointment in 3 months.  - check HbA1c today >> 8.2% (lower!) - needs a new eye exam >> advised to schedule! - UTD with flu shot - return in 3 months with his sugar log.

## 2015-10-24 ENCOUNTER — Other Ambulatory Visit: Payer: Self-pay | Admitting: Cardiology

## 2015-10-31 ENCOUNTER — Other Ambulatory Visit: Payer: Self-pay | Admitting: Cardiology

## 2015-12-05 ENCOUNTER — Other Ambulatory Visit: Payer: Self-pay | Admitting: *Deleted

## 2015-12-05 DIAGNOSIS — I10 Essential (primary) hypertension: Secondary | ICD-10-CM

## 2015-12-05 DIAGNOSIS — I2510 Atherosclerotic heart disease of native coronary artery without angina pectoris: Secondary | ICD-10-CM

## 2015-12-05 DIAGNOSIS — E785 Hyperlipidemia, unspecified: Secondary | ICD-10-CM

## 2015-12-05 MED ORDER — LISINOPRIL 10 MG PO TABS: 10.0000 mg | ORAL_TABLET | Freq: Two times a day (BID) | ORAL | Status: DC

## 2015-12-06 ENCOUNTER — Encounter: Payer: BC Managed Care – PPO | Admitting: Internal Medicine

## 2015-12-08 ENCOUNTER — Other Ambulatory Visit: Payer: Self-pay | Admitting: Internal Medicine

## 2015-12-13 ENCOUNTER — Other Ambulatory Visit: Payer: Self-pay | Admitting: Internal Medicine

## 2015-12-15 ENCOUNTER — Other Ambulatory Visit: Payer: Self-pay | Admitting: Cardiology

## 2015-12-19 ENCOUNTER — Other Ambulatory Visit: Payer: Self-pay | Admitting: Cardiology

## 2015-12-23 ENCOUNTER — Other Ambulatory Visit: Payer: Self-pay | Admitting: Cardiology

## 2016-01-01 ENCOUNTER — Telehealth: Payer: Self-pay | Admitting: Behavioral Health

## 2016-01-01 ENCOUNTER — Encounter: Payer: Self-pay | Admitting: Behavioral Health

## 2016-01-01 NOTE — Telephone Encounter (Signed)
Pre-Visit Call completed with patient and chart updated.   Pre-Visit Info documented in Specialty Comments under [employer].    

## 2016-01-02 ENCOUNTER — Encounter: Payer: Self-pay | Admitting: Internal Medicine

## 2016-01-02 ENCOUNTER — Ambulatory Visit (INDEPENDENT_AMBULATORY_CARE_PROVIDER_SITE_OTHER): Payer: BC Managed Care – PPO | Admitting: Internal Medicine

## 2016-01-02 VITALS — BP 128/78 | HR 62 | Temp 98.3°F | Ht 74.0 in | Wt 280.4 lb

## 2016-01-02 DIAGNOSIS — Z09 Encounter for follow-up examination after completed treatment for conditions other than malignant neoplasm: Secondary | ICD-10-CM

## 2016-01-02 DIAGNOSIS — Z0000 Encounter for general adult medical examination without abnormal findings: Secondary | ICD-10-CM | POA: Diagnosis not present

## 2016-01-02 LAB — COMPLETE METABOLIC PANEL WITH GFR
ALBUMIN: 4 g/dL (ref 3.6–5.1)
ALK PHOS: 57 U/L (ref 40–115)
ALT: 23 U/L (ref 9–46)
AST: 21 U/L (ref 10–35)
BUN: 18 mg/dL (ref 7–25)
CALCIUM: 9 mg/dL (ref 8.6–10.3)
CO2: 24 mmol/L (ref 20–31)
Chloride: 105 mmol/L (ref 98–110)
Creat: 0.94 mg/dL (ref 0.70–1.25)
GFR, EST NON AFRICAN AMERICAN: 85 mL/min (ref >=60)
GFR, Est African American: >89 mL/min (ref >=60)
Glucose, Bld: 181 mg/dL — ABNORMAL HIGH (ref 65–99)
POTASSIUM: 4.7 mmol/L (ref 3.5–5.3)
Sodium: 139 mmol/L (ref 135–146)
Total Bilirubin: 0.4 mg/dL (ref 0.2–1.2)
Total Protein: 6.3 g/dL (ref 6.1–8.1)

## 2016-01-02 LAB — CBC WITH DIFFERENTIAL/PLATELET
BASOS PCT: 0.6 % (ref 0.0–3.0)
Basophils Absolute: 0 10*3/uL (ref 0.0–0.1)
EOS ABS: 0.3 10*3/uL (ref 0.0–0.7)
Eosinophils Relative: 4.8 % (ref 0.0–5.0)
HEMATOCRIT: 39.9 % (ref 39.0–52.0)
Hemoglobin: 13.3 g/dL (ref 13.0–17.0)
LYMPHS PCT: 27 % (ref 12.0–46.0)
Lymphs Abs: 1.9 10*3/uL (ref 0.7–4.0)
MCHC: 33.4 g/dL (ref 30.0–36.0)
MCV: 83.9 fl (ref 78.0–100.0)
Monocytes Absolute: 0.6 10*3/uL (ref 0.1–1.0)
Monocytes Relative: 8.7 % (ref 3.0–12.0)
NEUTROS ABS: 4.2 10*3/uL (ref 1.4–7.7)
Neutrophils Relative %: 58.9 % (ref 43.0–77.0)
PLATELETS: 272 10*3/uL (ref 150.0–400.0)
RBC: 4.75 Mil/uL (ref 4.22–5.81)
RDW: 14.9 % (ref 11.5–15.5)
WBC: 7.2 10*3/uL (ref 4.0–10.5)

## 2016-01-02 LAB — LDL CHOLESTEROL, DIRECT: LDL DIRECT: 64 mg/dL

## 2016-01-02 LAB — LIPID PANEL
CHOLESTEROL: 155 mg/dL (ref 0–200)
HDL: 28.3 mg/dL — ABNORMAL LOW (ref >39.00)
NonHDL: 126.47
Total CHOL/HDL Ratio: 5
Triglycerides: 349 mg/dL — ABNORMAL HIGH (ref 0.0–149.0)
VLDL: 69.8 mg/dL — AB (ref 0.0–40.0)

## 2016-01-02 LAB — TSH: TSH: 0.73 u[IU]/mL (ref 0.35–4.50)

## 2016-01-02 LAB — HM DIABETES FOOT EXAM

## 2016-01-02 NOTE — Patient Instructions (Signed)
GO TO THE LAB : Get the blood work     GO TO THE FRONT DESK Schedule your next appointment for a routine checkup in 6-8 months     If you need more information about a healthy diet, heart disease, diabetes, hypertension visit: The American Heart Association, http://www.heart.org  The American diabetes Association  Http://www.diabetes.org

## 2016-01-02 NOTE — Progress Notes (Signed)
Pre visit review using our clinic review tool, if applicable. No additional management support is needed unless otherwise documented below in the visit note. 

## 2016-01-02 NOTE — Assessment & Plan Note (Signed)
Td  2014; Pneumonia shot 12-12; prevnar 2016  CCS: Never had a colonoscopy, states his insurance won't cover it. Provide an IFOB; States that will go for a colonoscopy once his insurance changes Declined DRE 11-2014, PSA was normal  diet and exercise discussed.

## 2016-01-02 NOTE — Assessment & Plan Note (Signed)
DM: Feet exam negative today, has an eye appointment today. Follow-up by endocrinology CAD: Asx, has an appointment to see cardiology in few weeks RTC 6-8 months

## 2016-01-02 NOTE — Progress Notes (Signed)
Subjective:    Patient ID: Randy Castro, male    DOB: 1951-08-08, 65 y.o.   MRN: [id number]  DOS:  01/02/2016 Type of visit - description : CPX Interval history: In general feeling well, eating healthier, active.  Wt Readings from Last 3 Encounters:  01/02/16 280 lb 6 oz (127.177 kg)  10/23/15 278 lb 9.6 oz (126.372 kg)  07/25/15 286 lb (129.729 kg)     Review of Systems Constitutional: No fever. No chills. No unexplained wt changes. No unusual sweats  HEENT: No dental problems, no ear discharge, no facial swelling, no voice changes. No eye discharge, no eye  redness , no  intolerance to light   Respiratory: No wheezing , no  difficulty breathing. No cough , no mucus production  Cardiovascular: No CP, no leg swelling , no  Palpitations  GI: no nausea, no vomiting, no diarrhea , no  abdominal pain.  No blood in the stools. No dysphagia, no odynophagia    Endocrine: No polyphagia, no polyuria , no polydipsia  GU: No dysuria, gross hematuria, difficulty urinating. No urinary urgency, no frequency.  Musculoskeletal: No joint swellings or unusual aches or pains  Skin: No change in the color of the skin, palor , no  Rash  Allergic, immunologic: No environmental allergies , no  food allergies  Neurological: No dizziness no  syncope. No headaches. No diplopia, no slurred, no slurred speech, no motor deficits, no facial  Numbness  Hematological: No enlarged lymph nodes, no easy bruising , no unusual bleedings  Psychiatry: No suicidal ideas, no hallucinations, no beavior problems, no confusion.  No unusual/severe anxiety, no depression   Past Medical History  Diagnosis Date  . Hyperlipidemia   . HTN (hypertension)   . CAD (coronary artery disease)     a. NSTEMI 12/12: EF 40-45%. [id number] balloon PTCA + Promus DES x 1 to mid LAD;  b. 11/2012: Cath with Cutting balloon PTCA  LAD for ISR to LAD, EF 55%;  c. 12/2012 Cath/PCI: LAD stents patent, 80 ost Diag (jailed)->PTCA,  LCX/RCA patent.  . Ischemic cardiomyopathy     a.  echo 08/06/11: dist ant wall, apical and septal and infero-apical HK, mild LVH, EF 40%, mild LAE, PASP 34, asc aorta mildly dilated, mild TR;  b.  Echo (3/13) showed recovery of LV systolic function with EF 0000000, grade I diastolic dysfunction, mild MR.   Marland Kitchen Exertional shortness of breath   . Type II diabetes mellitus (HCC) dx in the 90s  . Arthritis     "knees probably" (12/22/2012)    Past Surgical History  Procedure Laterality Date  . Anterior cervical decomp/discectomy fusion  in the 90s  . Coronary angioplasty with stent placement  07/2011    "1" (12/22/2012)  . Coronary angioplasty  12/15/2012; 12/22/2012  . Left heart catheterization with coronary angiogram N/A 08/06/2011    Procedure: LEFT HEART CATHETERIZATION WITH CORONARY ANGIOGRAM;  Surgeon: Burnell Blanks, MD;  Location: [facility] CATH LAB;  Service: Cardiovascular;  Laterality: N/A;  . Left heart catheterization with coronary angiogram N/A 12/15/2012    Procedure: LEFT HEART CATHETERIZATION WITH CORONARY ANGIOGRAM;  Surgeon: Sherren Mocha, MD;  Location: [facility] CATH LAB;  Service: Cardiovascular;  Laterality: N/A;  . Percutaneous coronary stent intervention (pci-s) Right 12/15/2012    Procedure: PERCUTANEOUS CORONARY STENT INTERVENTION (PCI-S);  Surgeon: Sherren Mocha, MD;  Location: [facility] CATH LAB;  Service: Cardiovascular;  Laterality: Right;  . Left heart catheterization with coronary angiogram N/A 12/22/2012    Procedure:  LEFT HEART CATHETERIZATION WITH CORONARY ANGIOGRAM;  Surgeon: Sherren Mocha, MD;  Location: [facility] CATH LAB;  Service: Cardiovascular;  Laterality: N/A;    Social History   Social History  . Marital Status: Married    Spouse Name: N/A  . Number of Children: 2   . Years of Education: N/A   Occupational History  . retired--maintenance tech    Social History Main Topics  . Smoking status: Former Smoker -- 1.00 packs/day for 25 years    Types: Cigarettes    Quit  date: 04/12/1992  . Smokeless tobacco: Never Used  . Alcohol Use: Yes     Comment: 12/22/2012 "rarely maybe every 3-4 months, beer"  . Drug Use: No  . Sexual Activity: Yes   Other Topics Concern  . Not on file   Social History Narrative   Moved back to [place] from [place]-- pt and wife     Family History  Problem Relation Age of Onset  . Colon cancer Neg Hx   . Prostate cancer Neg Hx   . Stroke Neg Hx   . Diabetes Mother   . Coronary artery disease Father   . Heart attack Father 28  . Diabetes Sister   . Diabetes Brother   . Cancer Brother        Medication List       This list is accurate as of: 01/02/16  4:21 PM.  Always use your most recent med list.               acetaminophen 325 MG tablet  Commonly known as:  TYLENOL  Take 650 mg by mouth every 6 (six) hours as needed for pain.     aspirin EC 81 MG tablet  Take 81 mg by mouth daily.     atorvastatin 80 MG tablet  Commonly known as:  LIPITOR  TAKE 1 TABLET BY MOUTH EVERY DAY     BD PEN NEEDLE NANO U/F 32G X 4 MM Misc  Generic drug:  Insulin Pen Needle  USE FOR INJECTIONS FOUR TIMES DAILY     carvedilol 12.5 MG tablet  Commonly known as:  COREG  Take one tablet by mouth twice daily.Patient is overdue for a one year follow up appointment. Please call and schedule for further refills     citalopram 20 MG tablet  Commonly known as:  CELEXA  Take 1 tablet (20 mg total) by mouth daily.     clopidogrel 75 MG tablet  Commonly known as:  PLAVIX  TAKE 1 TABLET BY MOUTH EVERY DAY     clotrimazole-betamethasone cream  Commonly known as:  LOTRISONE  Apply 1 application topically 2 (two) times daily.     empagliflozin 25 MG Tabs tablet  Commonly known as:  JARDIANCE  Take 25 mg by mouth daily.     famotidine 20 MG tablet  Commonly known as:  PEPCID  Take 20 mg by mouth 2 (two) times daily as needed for heartburn.     fenofibrate 145 MG tablet  Commonly known as:  TRICOR  TAKE 1 TABLET BY  MOUTH EVERY DAY     glucose blood test strip  Use 4x a day     Insulin Glargine 100 UNIT/ML Solostar Pen  Commonly known as:  BASAGLAR KWIKPEN  Inject 60 Units into the skin daily at 10 pm.     lisinopril 10 MG tablet  Commonly known as:  PRINIVIL,ZESTRIL  Take 1 tablet (10 mg total) by mouth  2 (two) times daily.     MAGNESIUM PO  Take 1 tablet by mouth daily.     metFORMIN 500 MG 24 hr tablet  Commonly known as:  GLUCOPHAGE-XR  TAKE 1 TABLET(500 MG) BY MOUTH THREE TIMES DAILY     multivitamin with minerals Tabs tablet  Take 1 tablet by mouth daily.     nitroGLYCERIN 0.4 MG SL tablet  Commonly known as:  NITROSTAT  Place 1 tablet (0.4 mg total) under the tongue every 5 (five) minutes as needed for chest pain.     NOVOLOG FLEXPEN 100 UNIT/ML FlexPen  Generic drug:  insulin aspart  ADMINISTER 25 TO 35 UNITS UNDER THE SKIN THREE TIMES DAILY WITH MEALS     omega-3 acid ethyl esters 1 g capsule  Commonly known as:  LOVAZA  Take 2 capsules (2 g total) by mouth 2 (two) times daily.     spironolactone 25 MG tablet  Commonly known as:  ALDACTONE  TAKE 1/2 TABLET BY MOUTH EVERY DAY           Objective:   Physical Exam BP 128/78 mmHg  Pulse 62  Temp(Src) 98.3 F (36.8 C) (Oral)  Ht 6\' 2"  (1.88 m)  Wt 280 lb 6 oz (127.177 kg)  BMI 35.98 kg/m2  SpO2 97%  General:   Well developed, well nourished . NAD.  Neck: No  thyromegaly HEENT:  Normocephalic . Face symmetric, atraumatic Lungs:  CTA B Normal respiratory effort, no intercostal retractions, no accessory muscle use. Heart: RRR,  no murmur.  No pretibial edema bilaterally  Abdomen:  Not distended, soft, non-tender. No rebound or rigidity.   DIABETIC FEET EXAM: No lower extremity edema Normal pedal pulses bilaterally Skin normal, nails normal, no calluses Pinprick examination of the feet normal. Neurologic:  alert & oriented X3.  Speech normal, gait appropriate for age and unassisted Strength symmetric and  appropriate for age.  Psych: Cognition and judgment appear intact.  Cooperative with normal attention span and concentration.  Behavior appropriate. No anxious or depressed appearing.    Assessment & Plan:   Assessment > DM --Dr Cruzita Lederer HTN Hyperlipidemia CAD, Ischemic cardiomyopathy NSTEMI in 12/12, unstable angina in 4/14 and 5/14. He had DES to mid LAD in 12/12. EF was 40% with apical hypokinesis at the time of the MI. Repeat echo in 3/13 showed EF 55-60%. In 4/14, he was admitted with unstable angina and had 90% pLAD in-stent restenosis. This was treated with cutting balloon angioplasty. He was readmitted, again with unstable angina, in 5/14. This time he had PTCA to 90% ostial D1 stenosis.  Anxiety-depression Skin cancer: Right leg, status post excision 2016; Dr Allyson Sabal  PLAN: DM: Feet exam negative today, has an eye appointment today. Follow-up by endocrinology CAD: Asx, has an appointment to see cardiology in few weeks RTC 6-8 months

## 2016-01-05 ENCOUNTER — Other Ambulatory Visit: Payer: Self-pay | Admitting: Internal Medicine

## 2016-01-09 LAB — HM DIABETES EYE EXAM

## 2016-01-13 ENCOUNTER — Other Ambulatory Visit: Payer: Self-pay | Admitting: Internal Medicine

## 2016-01-23 ENCOUNTER — Other Ambulatory Visit: Payer: Self-pay | Admitting: Cardiology

## 2016-01-23 ENCOUNTER — Ambulatory Visit (INDEPENDENT_AMBULATORY_CARE_PROVIDER_SITE_OTHER): Payer: BC Managed Care – PPO | Admitting: Internal Medicine

## 2016-01-23 ENCOUNTER — Encounter: Payer: Self-pay | Admitting: Internal Medicine

## 2016-01-23 ENCOUNTER — Other Ambulatory Visit: Payer: Self-pay

## 2016-01-23 VITALS — BP 128/47 | HR 67 | Temp 97.1°F | Ht 73.0 in | Wt 281.6 lb

## 2016-01-23 DIAGNOSIS — E1165 Type 2 diabetes mellitus with hyperglycemia: Secondary | ICD-10-CM

## 2016-01-23 DIAGNOSIS — E1159 Type 2 diabetes mellitus with other circulatory complications: Secondary | ICD-10-CM | POA: Diagnosis not present

## 2016-01-23 DIAGNOSIS — E669 Obesity, unspecified: Secondary | ICD-10-CM | POA: Diagnosis not present

## 2016-01-23 LAB — POCT GLYCOSYLATED HEMOGLOBIN (HGB A1C): Hemoglobin A1C: 8.8

## 2016-01-23 MED ORDER — BASAGLAR KWIKPEN 100 UNIT/ML ~~LOC~~ SOPN: 80.0000 [IU] | PEN_INJECTOR | Freq: Every day | SUBCUTANEOUS | Status: DC

## 2016-01-23 NOTE — Progress Notes (Signed)
Pre visit review using our clinic tool,if applicable. No additional management support is needed unless otherwise documented below in the visit note.  

## 2016-01-23 NOTE — Patient Instructions (Signed)
Please increase Basaglar to 80 units at bedtime (start with 70 units tonight).  Please continue: - Metformin ER 500 mg 2x a day - Jardiance 25 mg in am - NovoLog: 25 units with a smaller meal 35 units with a large meal  Please come back for a follow-up appointment in 3 months.  A1c today: 8.8%.

## 2016-01-23 NOTE — Progress Notes (Signed)
Subjective:     Patient ID: Randy Castro, male   DOB: [birth year], 65 y.o.   MRN: [id number]  HPI Randy Castro is a pleasant 65 y.o. man returning for f/u for DM2, dx 1998, uncontrolled, insulin dependent, with complications (CAD, s/p MI 07/2011, s/p PTCA with DES, s/p PTCA for in-stent restenosis 12/15/12; also ED). He is here with his wife who offers part of the hx. Last visit was 3 mo ago.  He lost 8 lbs before last visit after he started to change his diet 1 mo prior.He gained 3 pounds back since last visit.  Reviewed HbA1c levels:  Lab Results  Component Value Date   HGBA1C 8.2 10/23/2015   HGBA1C 9.4 07/25/2015   HGBA1C 10.6 04/25/2015   He is now on a regimen of: - Jardiance 25 mg daily in am - Metformin XR 500 mg 2x a day - Basaglar 50 >> 60 units at bedtime - Mealtime NovoLog 2-3x a day: 25 units with a smaller meal 30 units with a regular meal 35 units with a large meal - mostly  We stopped Glipizide when we started mealtime insulin. We stopped Bydureon when we started mealtime insulin We stopped Januvia 100 in 12/2013. He was on JanuMet 50/1000 bid >> stopped 2/2 diarrhea, fatigue >> restarted but with same SEs >> stopped. He retired on 08/15/2013 >> less activity, increased weight. Sugars have greatly increased >> we added Invokana. We had to stop this and switch to [place] per insurance coverage. He could not take maximum dose of metformin XR due to diarrhea. He could not tolerate Trulicity due to nausea and vomiting. We had to stop Toujeo 2/2 insurance coverage.  He checks his sugars approx 1-3/day: - am: 102, 145-217 >> 124-151, 188, some 200s >> 192-315 >> 159-241, 272, 287 >> 155-179 >> 166-260, 292 - after b'fast: 367 >> 158 >> n/c >> 231, 315 >> n/c  - before lunch: 180-310 >> 91, 129-244, 271 >> 74, 122-166 >> 205-301 >> 184-268, 314 >> 123-223 >> 168-240 - 2h after lunch: 143 >> n/c - before dinner: 147-232 >> 76, 123-160, 188, 224 >> 172, 212-280 >> 142-242 >>  142, 178, 253 >> n/c - bedtime: 209, 254, 289 >> n/c >> 89, 144, 205-250, 316 >> 124, 131 >> 301 >> 178, 240 >> n/c - nighttime: 3 lows 58-60s >> n/c No lows. He has hypoglycemia awareness at 65.  - No CKD. Last BUN/Cr:  Lab Results  Component Value Date   BUN 18 01/02/2016   CREATININE 0.94 01/02/2016  He is on lisinopril. - No neuropathy per foot exam 01/02/2016. - He has HL: Lab Results  Component Value Date   CHOL 155 01/02/2016   HDL 28.30* 01/02/2016   LDLCALC 43 11/28/2012   LDLDIRECT 64.0 01/02/2016   TRIG 349.0* 01/02/2016   CHOLHDL 5 01/02/2016  He is on Lipitor - no DR per last eye exam on 01/09/2016 >> no DR reportedly. He is on ASA 81. - Foot exam normal 12/2013.  PMH:   Pt had a period off the meds for cost reasons and was restarted on DM meds in 03/2011. In 07/2011, he had a NSTEMI and had a DES placed. He also has CHF, improved (EF increased from 40-45% in 07/2011 to 55-60% in 10/2011). Sees Dr. Aundra Dubin with Cardiology. He had CP/SOB >> saw Dr. Percival Spanish on 12/15/3012 >> heart catheterization >> PTCA for LAD instent restenosis >> feels much better after this.   I reviewed pt's medications, allergies,  PMH, social hx, family hx, and changes were documented in the history of present illness. Otherwise, unchanged from my initial visit note.  Review of Systems Constitutional: + weight gain, no fatigue, no subjective hyperthermia/hypothermia Eyes: no blurry vision, no xerophthalmia ENT: no sore throat, no nodules palpated in throat, no dysphagia/odynophagia, no hoarseness Cardiovascular: no CP/no palpitations/leg swelling Respiratory: no cough/SOB Gastrointestinal: no N/V/D/C/heartburn Musculoskeletal: muscle/joint aches Skin: no rashes Neurological: no tremors/numbness/tingling/dizziness  Objective:   Physical Exam BP 128/47 mmHg  Pulse 67  Temp(Src) 97.1 F (36.2 C) (Oral)  Ht 6\' 1"  (1.854 m)  Wt 281 lb 9.6 oz (127.733 kg)  BMI 37.16 kg/m2  SpO2 95% Body  mass index is 37.16 kg/(m^2).  Wt Readings from Last 3 Encounters:  01/23/16 281 lb 9.6 oz (127.733 kg)  01/02/16 280 lb 6 oz (127.177 kg)  10/23/15 278 lb 9.6 oz (126.372 kg)   Constitutional: overweight, in NAD Eyes: PERRLA, EOMI, no exophthalmos ENT: moist mucous membranes, no thyromegaly, no cervical lymphadenopathy Cardiovascular: RRR, No MRG Respiratory: CTA B Gastrointestinal: abdomen soft, NT, ND, BS+ Musculoskeletal: no deformities, strength intact in all 4 Skin: moist, warm, no rashes Neurological: no tremor with outstretched hands, DTR normal in all 4 Assessment:     1. DM2, insulin dependent, uncontrolled, with complications: - CAD, s/p NSTEMI 07/2011, with PTCA + DES (Dr. Aundra Dubin), s/p PTCA for in-stent restenosis 12/15/2012- CHF, EF 55-60% per 2D ECHO in 10/2011 - ED - no DR - no nephropathy  2. Obesity    Plan:     Patient's DM initially improved after starting to improve his diet, but now sugars are worse! He is not sure why and tells me he did not change his diet. He did gain 3 pounds back since last visit. This could have been due to increasing his toujeo from 50 to 60 units at bedtime.  - I advised him to: Patient Instructions  Please increase Basaglar to 80 units at bedtime (start with 70 units tonight).  Please continue: - Metformin ER 500 mg 2x a day - Jardiance 25 mg in am - NovoLog: 25 units with a smaller meal 35 units with a large meal  Please come back for a follow-up appointment in 3 months.  - check HbA1c today >> 8.8% (higher!) - UTD with eye exams - UTD with flu shot - return in 3 months with his sugar log.   2. Obesity - He started a diet before last visit and was able to lose 8 pounds, however, he gained 3 back since then. - Discussed about plateaus during weight loss and the fact that he should persist with his diet.

## 2016-02-03 ENCOUNTER — Other Ambulatory Visit: Payer: Self-pay | Admitting: Internal Medicine

## 2016-02-28 ENCOUNTER — Other Ambulatory Visit: Payer: Self-pay | Admitting: Cardiology

## 2016-03-13 ENCOUNTER — Other Ambulatory Visit: Payer: Self-pay | Admitting: Cardiology

## 2016-03-13 ENCOUNTER — Other Ambulatory Visit: Payer: Self-pay | Admitting: Internal Medicine

## 2016-03-16 ENCOUNTER — Ambulatory Visit (INDEPENDENT_AMBULATORY_CARE_PROVIDER_SITE_OTHER): Payer: BC Managed Care – PPO | Admitting: Cardiology

## 2016-03-16 ENCOUNTER — Encounter: Payer: Self-pay | Admitting: Cardiology

## 2016-03-16 VITALS — BP 124/64 | HR 65 | Ht 73.0 in | Wt 281.0 lb

## 2016-03-16 DIAGNOSIS — I2510 Atherosclerotic heart disease of native coronary artery without angina pectoris: Secondary | ICD-10-CM | POA: Diagnosis not present

## 2016-03-16 DIAGNOSIS — E785 Hyperlipidemia, unspecified: Secondary | ICD-10-CM | POA: Diagnosis not present

## 2016-03-16 DIAGNOSIS — I10 Essential (primary) hypertension: Secondary | ICD-10-CM | POA: Diagnosis not present

## 2016-03-16 MED ORDER — CARVEDILOL 12.5 MG PO TABS: ORAL_TABLET | ORAL | 11 refills | Status: DC

## 2016-03-16 MED ORDER — OMEGA-3-ACID ETHYL ESTERS 1 G PO CAPS: 2.0000 | ORAL_CAPSULE | Freq: Two times a day (BID) | ORAL | 11 refills | Status: DC

## 2016-03-16 MED ORDER — NITROGLYCERIN 0.4 MG SL SUBL: 0.4000 mg | SUBLINGUAL_TABLET | SUBLINGUAL | 3 refills | Status: DC | PRN

## 2016-03-16 NOTE — Patient Instructions (Signed)
Medication Instructions:  Take Lovaza 2 capsules two times a day.  Labwork: Your physician recommends that you return for a FASTING lipid profile /liver profile in 2 months.  Your physician recommends that you return for lab work in: 3 months BMET.     Testing/Procedures: none  Follow-Up: Your physician wants you to follow-up in: 1 year in the [facility]. (July 2018). You will receive a reminder letter in the mail two months in advance. If you don't receive a letter, please call our office to schedule the follow-up appointment.       If you need a refill on your cardiac medications before your next appointment, please call your pharmacy.

## 2016-03-16 NOTE — Progress Notes (Signed)
Patient ID: Randy Castro, male   DOB: 1951/06/10, 65 y.o.   MRN: [id number] PCP: Dr. Larose Kells  65 yo with history of DM and HTN had NSTEMI in 12/12, unstable angina in 4/14 and 5/14.  He had DES to mid LAD in 12/12.  EF was 40% with apical hypokinesis at the time of the MI.  Repeat echo in 3/13 showed EF 55-60%.  In 4/14, he was admitted with unstable angina and had 90% pLAD in-stent restenosis.  This was treated with cutting balloon angioplasty.  He was readmitted, again with unstable angina, in 5/14.  This time he had PTCA to 90% ostial D1 stenosis.    Patient has been doing well in general.  Weight is up about 6 lbs.  No chest pain. No exertional dyspnea.     ECG: NSR, PVC.     Labs (1/13): LDL 47, HDL 35, K 3.7, creatinine 1.0 Labs (2/13): K 4.1, creatinine 0.7 Labs (5/13): K 4.5, creatinine 0.8, LDL 40, HDL 34 Labs (5/14): K 4.1, creatinine 0.68 Labs (8/14): K 4, creatinine 0.9, HDL 33, LDL 59 Labs (7/15): K 3.7, creatinine 0.9 Labs (5/17): K 4.7, creatinine 0.94, LDL 64, TGs 349  PMH: 1. Diabetes mellitus 2. HTN 3. Hyperlipidemia 4. H/o neck surgery 5. CAD: NSTEMI in 12/12 with 99% mid LAD stenosis treated with DES 3 x 16 Promus to mid LAD. Unstable angina 4/14 with LHC showing 90% pLAD in-stent restenosis.  This was treated with cutting balloon angioplasty.  He was readmitted again with unstable angina in 5/14.  LHC showed 80-90% ostial D1 which was treated by PTCA.  6. Ischemic cardiomyopathy: EF 40-45% with anterior HK on LV-gram in 12/12.  Echo (12/12) with EF 40%, apical hypokinesis, mild LVH.  Echo (3/13) showed recovery of LV systolic function with EF 0000000, grade I diastolic dysfunction, mild MR.  Echo (8/14) with EF 60%, moderate LVH.   SH: Married, lives in [place].  Retired Architectural technologist.  Quit smoking in 1993.  Enjoys restoring hot rods.   FH: Father with MI at 64.  Brother with CAD.   ROS: All systems reviewed and negative except as per HPI.   Current Outpatient  Prescriptions  Medication Sig Dispense Refill  . acetaminophen (TYLENOL) 325 MG tablet Take 650 mg by mouth every 6 (six) hours as needed for pain.    Marland Kitchen aspirin EC 81 MG tablet Take 81 mg by mouth daily.    Marland Kitchen atorvastatin (LIPITOR) 80 MG tablet TAKE 1 TABLET BY MOUTH EVERY DAY 90 tablet 1  . BD PEN NEEDLE NANO U/F 32G X 4 MM MISC USE FOR INJECTIONS FOUR TIMES DAILY 300 each 1  . carvedilol (COREG) 12.5 MG tablet TAKE 1 TABLET BY MOUTH TWICE DAILY 60 tablet 11  . citalopram (CELEXA) 20 MG tablet Take 1 tablet (20 mg total) by mouth daily. 90 tablet 2  . clotrimazole-betamethasone (LOTRISONE) cream Apply 1 application topically 2 (two) times daily. 30 g 3  . famotidine (PEPCID) 20 MG tablet Take 20 mg by mouth 2 (two) times daily as needed for heartburn.    . fenofibrate (TRICOR) 145 MG tablet TAKE 1 TABLET BY MOUTH EVERY DAY 30 tablet 0  . glucose blood test strip Use 4x a day 200 each 11  . Insulin Glargine (BASAGLAR KWIKPEN) 100 UNIT/ML SOPN Inject 0.8 mLs (80 Units total) into the skin at bedtime. 10 pen 5  . JARDIANCE 25 MG TABS tablet TAKE 1 TABLET BY MOUTH DAILY 30 tablet 2  .  lisinopril (PRINIVIL,ZESTRIL) 10 MG tablet Take 1 tablet (10 mg total) by mouth 2 (two) times daily. 60 tablet 11  . MAGNESIUM PO Take 1 tablet by mouth daily.    . metFORMIN (GLUCOPHAGE-XR) 500 MG 24 hr tablet TAKE 1 TABLET(500 MG) BY MOUTH THREE TIMES DAILY 90 tablet 1  . Multiple Vitamin (MULTIVITAMIN WITH MINERALS) TABS Take 1 tablet by mouth daily.    . nitroGLYCERIN (NITROSTAT) 0.4 MG SL tablet Place 1 tablet (0.4 mg total) under the tongue every 5 (five) minutes as needed for chest pain. 100 tablet 3  . NOVOLOG FLEXPEN 100 UNIT/ML FlexPen ADMINISTER 25 TO 35 UNITS UNDER THE SKIN THREE TIMES DAILY WITH MEALS 30 mL 0  . omega-3 acid ethyl esters (LOVAZA) 1 g capsule Take 2 capsules (2 g total) by mouth 2 (two) times daily. 120 capsule 11  . spironolactone (ALDACTONE) 25 MG tablet TAKE ONE-HALF TABLET BY MOUTH  EVERY DAY 45 tablet 0  . clopidogrel (PLAVIX) 75 MG tablet TAKE 1 TABLET BY MOUTH EVERY DAY 90 tablet 3  . clopidogrel (PLAVIX) 75 MG tablet TAKE 1 TABLET BY MOUTH EVERY DAY 90 tablet 3   No current facility-administered medications for this visit.    BP 124/64   Pulse 65   Ht 6\' 1"  (1.854 m)   Wt 281 lb (127.5 kg)   BMI 37.07 kg/m  General: NAD, obese.  Neck: Thick, no JVD, no thyromegaly or thyroid nodule.  Lungs: Clear to auscultation bilaterally with normal respiratory effort. CV: Nondisplaced PMI.  Heart regular S1/S2, no S3/S4, no murmur.  No peripheral edema.  No carotid bruit.  Normal pedal pulses.  Abdomen: Soft, nontender, no hepatosplenomegaly, no distention.  Neurologic: Alert and oriented x 3.  Psych: Normal affect. Extremities: No clubbing or cyanosis.   Assessment/Plan:  Coronary Artery Disease  No current ischemic symptoms. Continue ASA, Plavix, ACEI, beta blocker, statin.  I will continue plavix long-term.  Needs more exercise, encouraged him to get an exercise bike.  Ischemic Cardiomyopathy  EF has returned to normal, 60% on 8/14 echo. Continue current doses of Coreg, lisinopril, and spironolactone. Will get a BMET today.  With spironolactone use, should get BMET at least every 4 months. I will try to arrange for serial labs.   Hyperlipidemia Triglycerides remained high in 5/17 with good LDL.  Increase Lovaza to 2 g bid. Repeat lipids in 2 months.  OSA Patient has body habitus for OSA, denies daytime sleepiness.  Wants to hold off on sleep study.   Followup in 1 year.  As I am transitioning to the CHF clinic, I will have him followup in our [place] office.   Loralie Champagne 03/16/2016

## 2016-03-17 ENCOUNTER — Telehealth: Payer: Self-pay

## 2016-03-17 NOTE — Telephone Encounter (Signed)
Prior auth for generic Lovaza submitted to [facility].

## 2016-03-20 ENCOUNTER — Other Ambulatory Visit: Payer: Self-pay | Admitting: Cardiology

## 2016-03-31 ENCOUNTER — Other Ambulatory Visit: Payer: Self-pay | Admitting: Internal Medicine

## 2016-03-31 ENCOUNTER — Other Ambulatory Visit: Payer: Self-pay | Admitting: Cardiology

## 2016-04-20 ENCOUNTER — Other Ambulatory Visit: Payer: Self-pay | Admitting: Cardiology

## 2016-04-20 ENCOUNTER — Other Ambulatory Visit: Payer: Self-pay | Admitting: Internal Medicine

## 2016-04-23 ENCOUNTER — Ambulatory Visit (INDEPENDENT_AMBULATORY_CARE_PROVIDER_SITE_OTHER): Payer: BC Managed Care – PPO | Admitting: Internal Medicine

## 2016-04-23 ENCOUNTER — Encounter: Payer: Self-pay | Admitting: Internal Medicine

## 2016-04-23 VITALS — BP 124/76 | HR 60 | Temp 97.6°F | Resp 14 | Ht 73.0 in | Wt 288.4 lb

## 2016-04-23 DIAGNOSIS — Z23 Encounter for immunization: Secondary | ICD-10-CM

## 2016-04-23 DIAGNOSIS — J029 Acute pharyngitis, unspecified: Secondary | ICD-10-CM

## 2016-04-23 LAB — POCT RAPID STREP A (OFFICE): RAPID STREP A SCREEN: NEGATIVE

## 2016-04-23 NOTE — Progress Notes (Signed)
Subjective:    Patient ID: Randy Castro, male    DOB: 1951/03/17, 65 y.o.   MRN: [id number]  DOS:  04/23/2016 Type of visit - description : Acute visit, here with his wife Interval history: 3 weeks' history of on and off "sore throat", reports the pain is deep on the right side of the throat, points to the neck. When he looks in the mirror he sees nothing unusual except maybe some redness. Medications are the same.   Review of Systems No fever, chills. No runny nose or sore throat Occasional cough which is not unusual for him Occasional heartburn, on Pepcid as needed Past Medical History:  Diagnosis Date  . Arthritis    "knees probably" (12/22/2012)  . CAD (coronary artery disease)    a. NSTEMI 12/12: EF 40-45%. [id number] balloon PTCA + Promus DES x 1 to mid LAD;  b. 11/2012: Cath with Cutting balloon PTCA  LAD for ISR to LAD, EF 55%;  c. 12/2012 Cath/PCI: LAD stents patent, 80 ost Diag (jailed)->PTCA, LCX/RCA patent.  . Exertional shortness of breath   . HTN (hypertension)   . Hyperlipidemia   . Ischemic cardiomyopathy    a.  echo 08/06/11: dist ant wall, apical and septal and infero-apical HK, mild LVH, EF 40%, mild LAE, PASP 34, asc aorta mildly dilated, mild TR;  b.  Echo (3/13) showed recovery of LV systolic function with EF 0000000, grade I diastolic dysfunction, mild MR.   . Type II diabetes mellitus ([place]) dx in the 90s    Past Surgical History:  Procedure Laterality Date  . ANTERIOR CERVICAL DECOMP/DISCECTOMY FUSION  in the 90s  . CORONARY ANGIOPLASTY  12/15/2012; 12/22/2012  . CORONARY ANGIOPLASTY WITH STENT PLACEMENT  07/2011   "1" (12/22/2012)  . LEFT HEART CATHETERIZATION WITH CORONARY ANGIOGRAM N/A 08/06/2011   Procedure: LEFT HEART CATHETERIZATION WITH CORONARY ANGIOGRAM;  Surgeon: Burnell Blanks, MD;  Location: [facility] CATH LAB;  Service: Cardiovascular;  Laterality: N/A;  . LEFT HEART CATHETERIZATION WITH CORONARY ANGIOGRAM N/A 12/15/2012   Procedure: LEFT HEART  CATHETERIZATION WITH CORONARY ANGIOGRAM;  Surgeon: Sherren Mocha, MD;  Location: [facility] CATH LAB;  Service: Cardiovascular;  Laterality: N/A;  . LEFT HEART CATHETERIZATION WITH CORONARY ANGIOGRAM N/A 12/22/2012   Procedure: LEFT HEART CATHETERIZATION WITH CORONARY ANGIOGRAM;  Surgeon: Sherren Mocha, MD;  Location: [facility] CATH LAB;  Service: Cardiovascular;  Laterality: N/A;  . PERCUTANEOUS CORONARY STENT INTERVENTION (PCI-S) Right 12/15/2012   Procedure: PERCUTANEOUS CORONARY STENT INTERVENTION (PCI-S);  Surgeon: Sherren Mocha, MD;  Location: [facility] CATH LAB;  Service: Cardiovascular;  Laterality: Right;    Social History   Social History  . Marital status: Married    Spouse name: N/A  . Number of children: 2   . Years of education: N/A   Occupational History  . retired--maintenance tech    Social History Main Topics  . Smoking status: Former Smoker    Packs/day: 1.00    Years: 25.00    Types: Cigarettes    Quit date: 04/12/1992  . Smokeless tobacco: Never Used  . Alcohol use Yes     Comment: 12/22/2012 "rarely maybe every 3-4 months, beer"  . Drug use: No  . Sexual activity: Yes   Other Topics Concern  . Not on file   Social History Narrative   Moved back to [place] from [place]-- pt and wife        Medication List       Accurate as of 04/23/16 11:59  PM. Always use your most recent med list.          acetaminophen 325 MG tablet Commonly known as:  TYLENOL Take 650 mg by mouth every 6 (six) hours as needed for pain.   aspirin EC 81 MG tablet Take 81 mg by mouth daily.   atorvastatin 80 MG tablet Commonly known as:  LIPITOR Take 1 tablet (80 mg total) by mouth daily.   BASAGLAR KWIKPEN 100 UNIT/ML Sopn Inject 0.8 mLs (80 Units total) into the skin at bedtime.   BD PEN NEEDLE NANO U/F 32G X 4 MM Misc Generic drug:  Insulin Pen Needle USE FOR INJECTIONS FOUR TIMES DAILY   carvedilol 12.5 MG tablet Commonly known as:  COREG TAKE 1 TABLET BY MOUTH TWICE DAILY     citalopram 20 MG tablet Commonly known as:  CELEXA Take 1 tablet (20 mg total) by mouth daily.   clopidogrel 75 MG tablet Commonly known as:  PLAVIX TAKE 1 TABLET BY MOUTH EVERY DAY   clotrimazole-betamethasone cream Commonly known as:  LOTRISONE Apply 1 application topically 2 (two) times daily.   famotidine 20 MG tablet Commonly known as:  PEPCID Take 20 mg by mouth 2 (two) times daily as needed for heartburn.   fenofibrate 145 MG tablet Commonly known as:  TRICOR TAKE 1 TABLET BY MOUTH EVERY DAY   glucose blood test strip Use 4x a day   JARDIANCE 25 MG Tabs tablet Generic drug:  empagliflozin TAKE 1 TABLET BY MOUTH DAILY   lisinopril 10 MG tablet Commonly known as:  PRINIVIL,ZESTRIL Take 1 tablet (10 mg total) by mouth 2 (two) times daily.   MAGNESIUM PO Take 1 tablet by mouth daily.   metFORMIN 500 MG 24 hr tablet Commonly known as:  GLUCOPHAGE-XR TAKE 1 TABLET(500 MG) BY MOUTH THREE TIMES DAILY   multivitamin with minerals Tabs tablet Take 1 tablet by mouth daily.   nitroGLYCERIN 0.4 MG SL tablet Commonly known as:  NITROSTAT Place 1 tablet (0.4 mg total) under the tongue every 5 (five) minutes as needed for chest pain.   NOVOLOG FLEXPEN 100 UNIT/ML FlexPen Generic drug:  insulin aspart ADMINISTER 25 TO 35 UNITS UNDER THE SKIN THREE TIMES DAILY WITH MEALS   omega-3 acid ethyl esters 1 g capsule Commonly known as:  LOVAZA Take 2 capsules (2 g total) by mouth 2 (two) times daily.   spironolactone 25 MG tablet Commonly known as:  ALDACTONE TAKE 1/2 TABLET BY MOUTH EVERY DAY          Objective:   Physical Exam  Neck:     BP 124/76 (BP Location: Left Arm, Patient Position: Sitting, Cuff Size: Normal)   Pulse 60   Temp 97.6 F (36.4 C) (Oral)   Resp 14   Ht 6\' 1"  (1.854 m)   Wt 288 lb 6 oz (130.8 kg)   SpO2 96%   BMI 38.05 kg/m  General:   Well developed, well nourished . NAD.  HEENT:  Normocephalic . Face symmetric, atraumatic. TMs  normal, throat symmetric, no red, tonsils small, tone without lesions. Neck: No thyromegaly, no LADs Lungs:  CTA B Normal respiratory effort, no intercostal retractions, no accessory muscle use. Heart: RRR,  no murmur.  No pretibial edema bilaterally  Skin: Not pale. Not jaundice Neurologic:  alert & oriented X3.  Speech normal, gait appropriate for age and unassisted Psych--  Cognition and judgment appear intact.  Cooperative with normal attention span and concentration.  Behavior appropriate. No anxious or depressed  appearing.      Assessment & Plan:   Assessment > DM --Dr Cruzita Lederer HTN Hyperlipidemia CAD, Ischemic cardiomyopathy NSTEMI in 12/12, unstable angina in 4/14 and 5/14. He had DES to mid LAD in 12/12. EF was 40% with apical hypokinesis at the time of the MI. Repeat echo in 3/13 showed EF 55-60%. In 4/14, he was admitted with unstable angina and had 90% pLAD in-stent restenosis. This was treated with cutting balloon angioplasty. Readmitted, again with unstable angina, in 5/14. This time he had PTCA to 90% ostial D1 stenosis.  Anxiety-depression Skin cancer: Right leg, status post excision 2016; Dr Allyson Sabal  PLAN: Sore throat: Strep test negative, etiology unclear, on further questioning, states that the heartburn has been a little more noticeable lately. Recommend GERD precautions, take Pepcid qd not as needed. Call if not better. Flu shot today

## 2016-04-23 NOTE — Patient Instructions (Signed)
Take PEPCID every day  Food Choices for Gastroesophageal Reflux Disease, Adult When you have gastroesophageal reflux disease (GERD), the foods you eat and your eating habits are very important. Choosing the right foods can help ease the discomfort of GERD. WHAT GENERAL GUIDELINES DO I NEED TO FOLLOW?  Choose fruits, vegetables, whole grains, low-fat dairy products, and low-fat meat, fish, and poultry.  Limit fats such as oils, salad dressings, butter, nuts, and avocado.  Keep a food diary to identify foods that cause symptoms.  Avoid foods that cause reflux. These may be different for different people.  Eat frequent small meals instead of three large meals each day.  Eat your meals slowly, in a relaxed setting.  Limit fried foods.  Cook foods using methods other than frying.  Avoid drinking alcohol.  Avoid drinking large amounts of liquids with your meals.  Avoid bending over or lying down until 2-3 hours after eating. WHAT FOODS ARE NOT RECOMMENDED? The following are some foods and drinks that may worsen your symptoms: Vegetables Tomatoes. Tomato juice. Tomato and spaghetti sauce. Chili peppers. Onion and garlic. Horseradish. Fruits Oranges, grapefruit, and lemon (fruit and juice). Meats High-fat meats, fish, and poultry. This includes hot dogs, ribs, ham, sausage, salami, and bacon. Dairy Whole milk and chocolate milk. Sour cream. Cream. Butter. Ice cream. Cream cheese.  Beverages Coffee and tea, with or without caffeine. Carbonated beverages or energy drinks. Condiments Hot sauce. Barbecue sauce.  Sweets/Desserts Chocolate and cocoa. Donuts. Peppermint and spearmint. Fats and Oils High-fat foods, including [place] fries and potato chips. Other Vinegar. Strong spices, such as black pepper, white pepper, red pepper, cayenne, curry powder, cloves, ginger, and chili powder. The items listed above may not be a complete list of foods and beverages to avoid. Contact your  dietitian for more information.   This information is not intended to replace advice given to you by your health care provider. Make sure you discuss any questions you have with your health care provider.   Document Released: 08/10/2005 Document Revised: 08/31/2014 Document Reviewed: 06/14/2013 Elsevier Interactive Patient Education [employer].

## 2016-04-23 NOTE — Progress Notes (Signed)
Pre visit review using our clinic review tool, if applicable. No additional management support is needed unless otherwise documented below in the visit note. 

## 2016-04-24 ENCOUNTER — Other Ambulatory Visit: Payer: Self-pay | Admitting: Internal Medicine

## 2016-04-24 NOTE — Assessment & Plan Note (Signed)
Sore throat: Strep test negative, etiology unclear, on further questioning, states that the heartburn has been a little more noticeable lately. Recommend GERD precautions, take Pepcid qd not as needed. Call if not better. Flu shot today

## 2016-05-01 ENCOUNTER — Other Ambulatory Visit: Payer: Self-pay | Admitting: Internal Medicine

## 2016-05-01 ENCOUNTER — Encounter: Payer: Self-pay | Admitting: Internal Medicine

## 2016-05-15 ENCOUNTER — Other Ambulatory Visit: Payer: Self-pay | Admitting: Internal Medicine

## 2016-05-18 ENCOUNTER — Other Ambulatory Visit: Payer: BC Managed Care – PPO | Admitting: *Deleted

## 2016-05-18 DIAGNOSIS — I10 Essential (primary) hypertension: Secondary | ICD-10-CM

## 2016-05-18 DIAGNOSIS — E785 Hyperlipidemia, unspecified: Secondary | ICD-10-CM

## 2016-05-18 DIAGNOSIS — I2510 Atherosclerotic heart disease of native coronary artery without angina pectoris: Secondary | ICD-10-CM

## 2016-05-18 LAB — HEPATIC FUNCTION PANEL
ALBUMIN: 3.9 g/dL (ref 3.6–5.1)
ALK PHOS: 58 U/L (ref 40–115)
ALT: 18 U/L (ref 9–46)
AST: 19 U/L (ref 10–35)
Bilirubin, Direct: 0.1 mg/dL (ref <=0.2)
Indirect Bilirubin: 0.3 mg/dL (ref 0.2–1.2)
TOTAL PROTEIN: 6.1 g/dL (ref 6.1–8.1)
Total Bilirubin: 0.4 mg/dL (ref 0.2–1.2)

## 2016-05-18 LAB — BASIC METABOLIC PANEL
BUN: 16 mg/dL (ref 7–25)
CALCIUM: 8.9 mg/dL (ref 8.6–10.3)
CO2: 26 mmol/L (ref 20–31)
CREATININE: 0.97 mg/dL (ref 0.70–1.25)
Chloride: 106 mmol/L (ref 98–110)
Glucose, Bld: 190 mg/dL — ABNORMAL HIGH (ref 65–99)
Potassium: 4.5 mmol/L (ref 3.5–5.3)
Sodium: 141 mmol/L (ref 135–146)

## 2016-05-18 LAB — LIPID PANEL
CHOL/HDL RATIO: 5.1 ratio — AB (ref <=5.0)
Cholesterol: 143 mg/dL (ref 125–200)
HDL: 28 mg/dL — AB (ref >=40)
LDL Cholesterol: 46 mg/dL (ref <130)
TRIGLYCERIDES: 343 mg/dL — AB (ref <150)
VLDL: 69 mg/dL — ABNORMAL HIGH (ref <30)

## 2016-05-20 ENCOUNTER — Other Ambulatory Visit: Payer: Self-pay | Admitting: Internal Medicine

## 2016-05-23 ENCOUNTER — Other Ambulatory Visit: Payer: Self-pay | Admitting: Internal Medicine

## 2016-05-25 ENCOUNTER — Encounter: Payer: Self-pay | Admitting: Internal Medicine

## 2016-05-25 ENCOUNTER — Ambulatory Visit (INDEPENDENT_AMBULATORY_CARE_PROVIDER_SITE_OTHER): Payer: BC Managed Care – PPO | Admitting: Internal Medicine

## 2016-05-25 VITALS — BP 140/80 | HR 63 | Ht 74.5 in | Wt 288.0 lb

## 2016-05-25 DIAGNOSIS — Z6836 Body mass index (BMI) 36.0-36.9, adult: Secondary | ICD-10-CM | POA: Diagnosis not present

## 2016-05-25 DIAGNOSIS — E1165 Type 2 diabetes mellitus with hyperglycemia: Secondary | ICD-10-CM

## 2016-05-25 DIAGNOSIS — E1159 Type 2 diabetes mellitus with other circulatory complications: Secondary | ICD-10-CM | POA: Diagnosis not present

## 2016-05-25 DIAGNOSIS — E6609 Other obesity due to excess calories: Secondary | ICD-10-CM

## 2016-05-25 DIAGNOSIS — IMO0001 Reserved for inherently not codable concepts without codable children: Secondary | ICD-10-CM

## 2016-05-25 LAB — POCT GLYCOSYLATED HEMOGLOBIN (HGB A1C): Hemoglobin A1C: 9.4

## 2016-05-25 MED ORDER — EXENATIDE ER 2 MG ~~LOC~~ PEN
PEN_INJECTOR | SUBCUTANEOUS | 5 refills | Status: DC
Start: 2016-05-25 — End: 2016-09-03

## 2016-05-25 MED ORDER — INSULIN ASPART 100 UNIT/ML FLEXPEN
PEN_INJECTOR | SUBCUTANEOUS | 11 refills | Status: DC
Start: 2016-05-25 — End: 2016-09-18

## 2016-05-25 NOTE — Addendum Note (Signed)
Addended by: Caprice Beaver T on: 05/25/2016 10:02 AM   Modules accepted: Orders

## 2016-05-25 NOTE — Patient Instructions (Signed)
Please continue:  - Basaglar to 75 units at bedtime - Metformin ER 500 mg 2x a day - Jardiance 25 mg in am - NovoLog: 25 units with a smaller meal 35 units with a large meal  Please restart Bydureon 2 g weekly.  Please come back for a follow-up appointment in 3 months.

## 2016-05-25 NOTE — Progress Notes (Signed)
Subjective:     Patient ID: Randy Castro, male   DOB: 05-03-1951, 65 y.o.   MRN: [id number]  HPI Randy Castro is a pleasant 65 y.o. man returning for f/u for DM2, dx 1998, uncontrolled, insulin dependent, with complications (CAD, s/p MI 07/2011, s/p PTCA with DES, s/p PTCA for in-stent restenosis 12/15/12; also ED). He is here with his wife who offers part of the hx. Last visit was 4 mo ago.  Reviewed HbA1c levels:  Lab Results  Component Value Date   HGBA1C 8.8 01/23/2016   HGBA1C 8.2 10/23/2015   HGBA1C 9.4 07/25/2015   He is now on a regimen of: - Jardiance 25 mg daily in am - Metformin XR 500 mg 2x a day - Basaglar 50 >> 60 >> 75 units at bedtime - Mealtime NovoLog 2-3x a day: 25 units with a smaller meal 30 units with a regular meal 35 units with a large meal - mostly  We stopped Glipizide when we started mealtime insulin. We stopped Bydureon when we started mealtime insulin We stopped Januvia 100 in 12/2013. He was on JanuMet 50/1000 bid >> stopped 2/2 diarrhea, fatigue >> restarted but with same SEs >> stopped. He retired on 08/15/2013 >> less activity, increased weight. Sugars have greatly increased >> we added Invokana. We had to stop this and switch to [place] per insurance coverage. He could not take maximum dose of metformin XR due to diarrhea. He could not tolerate Trulicity due to nausea and vomiting. We had to stop Toujeo 2/2 insurance coverage.  He checks his sugars approx 1-3/day: - am: 102, 145-217 >> 124-151, 188, some 200s >> 192-315 >> 159-241, 272, 287 >> 155-179 >> 166-260, 292 >> 113-221, 267 - after b'fast: 367 >> 158 >> n/c >> 231, 315 >> n/c  - before lunch: 180-310 >> 91, 129-244, 271 >> 74, 122-166 >> 205-301 >> 184-268, 314 >> 123-223 >> 168-240 >> 129-213, 247 - 2h after lunch: 143 >> n/c - before dinner: 147-232 >> 76, 123-160, 188, 224 >> 172, 212-280 >> 142-242 >> 142, 178, 253 >> n/c >> 111-119 - bedtime: 209, 254, 289 >> n/c >> 89, 144, 205-250,  316 >> 124, 131 >> 301 >> 178, 240 >> n/c >> 140, 205 - nighttime: 3 lows 58-60s >> n/c No lows. He has hypoglycemia awareness at 65.  - No CKD. Last BUN/Cr:  Lab Results  Component Value Date   BUN 16 05/18/2016   CREATININE 0.97 05/18/2016  He is on lisinopril. - No neuropathy per foot exam 01/02/2016. - He has HL: Lab Results  Component Value Date   CHOL 143 05/18/2016   HDL 28 (L) 05/18/2016   LDLCALC 46 05/18/2016   LDLDIRECT 64.0 01/02/2016   TRIG 343 (H) 05/18/2016   CHOLHDL 5.1 (H) 05/18/2016  He is on Lipitor - no DR per last eye exam on 01/09/2016 >> no DR reportedly. He is on ASA 81. - Foot exam normal 12/2013.  PMH:   Pt had a period off the meds for cost reasons and was restarted on DM meds in 03/2011. In 07/2011, he had a NSTEMI and had a DES placed. He also has CHF, improved (EF increased from 40-45% in 07/2011 to 55-60% in 10/2011). Sees Dr. Aundra Dubin with Cardiology. He had CP/SOB >> saw Dr. Percival Spanish on 12/15/3012 >> heart catheterization >> PTCA for LAD instent restenosis.  I reviewed pt's medications, allergies, PMH, social hx, family hx, and changes were documented in the history of present illness. Otherwise,  unchanged from my initial visit note.  Review of Systems Constitutional: no weight gain, no fatigue, no subjective hyperthermia/hypothermia Eyes: no blurry vision, no xerophthalmia ENT: no sore throat, no nodules palpated in throat, no dysphagia/odynophagia, no hoarseness Cardiovascular: no CP/no palpitations/leg swelling Respiratory: no cough/SOB Gastrointestinal: no N/V/D/C/heartburn Musculoskeletal: muscle/joint aches Skin: no rashes Neurological: no tremors/numbness/tingling/dizziness  Objective:   Physical Exam BP 140/80 (BP Location: Left Arm, Patient Position: Sitting)   Pulse 63   Ht 6' 2.5" (1.892 m)   Wt 288 lb (130.6 kg)   SpO2 96%   BMI 36.48 kg/m  Body mass index is 36.48 kg/m.  Wt Readings from Last 3 Encounters:  05/25/16 288  lb (130.6 kg)  04/23/16 288 lb 6 oz (130.8 kg)  03/16/16 281 lb (127.5 kg)   Constitutional: obese, in NAD Eyes: PERRLA, EOMI, no exophthalmos ENT: moist mucous membranes, no thyromegaly, no cervical lymphadenopathy Cardiovascular: RRR, No MRG Respiratory: CTA B Gastrointestinal: abdomen soft, NT, ND, BS+ Musculoskeletal: no deformities, strength intact in all 4 Skin: moist, warm, no rashes Neurological: no tremor with outstretched hands, DTR normal in all 4 Assessment:     1. DM2, insulin dependent, uncontrolled, with complications: - CAD, s/p NSTEMI 07/2011, with PTCA + DES (Dr. Aundra Dubin), s/p PTCA for in-stent restenosis 12/15/2012- CHF, EF 55-60% per 2D ECHO in 10/2011 - ED - no DR - no nephropathy  2. Obesity    Plan:     Patient's DM is worse! We increased the Lantus at last visit, but sugars are still high. Will try to add back Bydureon. - I also strongly advised him to start working out at the gym - he have one where he lives and improve diet - I advised him to: Patient Instructions  Please continue:  - Basaglar to 75 units at bedtime - Metformin ER 500 mg 2x a day - Jardiance 25 mg in am - NovoLog: 25 units with a smaller meal 35 units with a large meal  Please restart Bydureon 2 g weekly.  Please come back for a follow-up appointment in 3 months.  - check HbA1c today >> 9.4% (higher!) - UTD with eye exams - UTD with flu shot - return in 3 months with his sugar log.   2. Obesity - weight stable - Discussed about the need for exercise to increase his metabolism and the fact that he should improve diet. - will also restart Bydureon which should help w/ weight loss  Philemon Kingdom, MD PhD [facility] Endocrinology

## 2016-05-28 ENCOUNTER — Other Ambulatory Visit: Payer: Self-pay | Admitting: Internal Medicine

## 2016-06-03 ENCOUNTER — Other Ambulatory Visit: Payer: Self-pay

## 2016-06-15 ENCOUNTER — Other Ambulatory Visit: Payer: BC Managed Care – PPO | Admitting: *Deleted

## 2016-06-15 DIAGNOSIS — E7800 Pure hypercholesterolemia, unspecified: Secondary | ICD-10-CM

## 2016-06-15 DIAGNOSIS — I10 Essential (primary) hypertension: Secondary | ICD-10-CM

## 2016-06-15 LAB — BASIC METABOLIC PANEL
BUN: 19 mg/dL (ref 7–25)
CHLORIDE: 103 mmol/L (ref 98–110)
CO2: 24 mmol/L (ref 20–31)
CREATININE: 1.2 mg/dL (ref 0.70–1.25)
Calcium: 9.7 mg/dL (ref 8.6–10.3)
Glucose, Bld: 300 mg/dL — ABNORMAL HIGH (ref 65–99)
Potassium: 4.4 mmol/L (ref 3.5–5.3)
Sodium: 138 mmol/L (ref 135–146)

## 2016-06-15 NOTE — Addendum Note (Signed)
Addended by: Eulis Foster on: 06/15/2016 08:48 AM   Modules accepted: Orders

## 2016-06-25 ENCOUNTER — Other Ambulatory Visit: Payer: Self-pay | Admitting: Internal Medicine

## 2016-06-25 DIAGNOSIS — E1159 Type 2 diabetes mellitus with other circulatory complications: Secondary | ICD-10-CM

## 2016-06-25 DIAGNOSIS — E1165 Type 2 diabetes mellitus with hyperglycemia: Secondary | ICD-10-CM

## 2016-06-25 MED ORDER — ALBIGLUTIDE 50 MG ~~LOC~~ PEN
PEN_INJECTOR | SUBCUTANEOUS | 5 refills | Status: DC
Start: 2016-06-25 — End: 2016-11-13

## 2016-06-27 ENCOUNTER — Other Ambulatory Visit: Payer: Self-pay | Admitting: Internal Medicine

## 2016-07-08 ENCOUNTER — Other Ambulatory Visit: Payer: Self-pay | Admitting: Internal Medicine

## 2016-07-28 ENCOUNTER — Other Ambulatory Visit: Payer: Self-pay | Admitting: Internal Medicine

## 2016-07-31 ENCOUNTER — Other Ambulatory Visit: Payer: Self-pay | Admitting: Internal Medicine

## 2016-08-03 ENCOUNTER — Encounter: Payer: Self-pay | Admitting: Medical

## 2016-08-03 ENCOUNTER — Telehealth: Payer: Self-pay | Admitting: Internal Medicine

## 2016-08-03 ENCOUNTER — Ambulatory Visit (HOSPITAL_BASED_OUTPATIENT_CLINIC_OR_DEPARTMENT_OTHER)
Admission: RE | Admit: 2016-08-03 | Discharge: 2016-08-03 | Disposition: A | Discharge status: Home / Self Care | Payer: BC Managed Care – PPO | Source: Ambulatory Visit | Attending: Medical | Admitting: Medical

## 2016-08-03 ENCOUNTER — Telehealth: Payer: Self-pay | Admitting: Medical

## 2016-08-03 ENCOUNTER — Ambulatory Visit (INDEPENDENT_AMBULATORY_CARE_PROVIDER_SITE_OTHER): Payer: BC Managed Care – PPO | Admitting: Medical

## 2016-08-03 VITALS — BP 123/66 | HR 78 | Ht 74.5 in | Wt 287.6 lb

## 2016-08-03 DIAGNOSIS — E785 Hyperlipidemia, unspecified: Secondary | ICD-10-CM | POA: Diagnosis not present

## 2016-08-03 DIAGNOSIS — G319 Degenerative disease of nervous system, unspecified: Secondary | ICD-10-CM | POA: Insufficient documentation

## 2016-08-03 DIAGNOSIS — R42 Dizziness and giddiness: Secondary | ICD-10-CM

## 2016-08-03 DIAGNOSIS — E118 Type 2 diabetes mellitus with unspecified complications: Secondary | ICD-10-CM | POA: Diagnosis present

## 2016-08-03 DIAGNOSIS — I10 Essential (primary) hypertension: Secondary | ICD-10-CM

## 2016-08-03 DIAGNOSIS — J320 Chronic maxillary sinusitis: Secondary | ICD-10-CM | POA: Diagnosis not present

## 2016-08-03 LAB — CBC WITH DIFFERENTIAL/PLATELET
BASOS ABS: 0.1 10*3/uL (ref 0.0–0.1)
Basophils Relative: 0.9 % (ref 0.0–3.0)
EOS ABS: 0.3 10*3/uL (ref 0.0–0.7)
Eosinophils Relative: 4.4 % (ref 0.0–5.0)
HCT: 42.4 % (ref 39.0–52.0)
Hemoglobin: 14.2 g/dL (ref 13.0–17.0)
LYMPHS ABS: 2.3 10*3/uL (ref 0.7–4.0)
LYMPHS PCT: 29.1 % (ref 12.0–46.0)
MCHC: 33.6 g/dL (ref 30.0–36.0)
MCV: 83.3 fl (ref 78.0–100.0)
MONOS PCT: 8.6 % (ref 3.0–12.0)
Monocytes Absolute: 0.7 10*3/uL (ref 0.1–1.0)
NEUTROS PCT: 57 % (ref 43.0–77.0)
Neutro Abs: 4.5 10*3/uL (ref 1.4–7.7)
PLATELETS: 283 10*3/uL (ref 150.0–400.0)
RBC: 5.09 Mil/uL (ref 4.22–5.81)
RDW: 15.1 % (ref 11.5–15.5)
WBC: 7.9 10*3/uL (ref 4.0–10.5)

## 2016-08-03 LAB — COMPREHENSIVE METABOLIC PANEL
ALK PHOS: 58 U/L (ref 39–117)
ALT: 18 U/L (ref 0–53)
AST: 15 U/L (ref 0–37)
Albumin: 4.3 g/dL (ref 3.5–5.2)
BILIRUBIN TOTAL: 0.3 mg/dL (ref 0.2–1.2)
BUN: 20 mg/dL (ref 6–23)
CO2: 24 meq/L (ref 19–32)
CREATININE: 1.05 mg/dL (ref 0.40–1.50)
Calcium: 9.8 mg/dL (ref 8.4–10.5)
Chloride: 104 mEq/L (ref 96–112)
GFR: 75.36 mL/min (ref >60.00)
GLUCOSE: 191 mg/dL — AB (ref 70–99)
Potassium: 4.4 mEq/L (ref 3.5–5.1)
Sodium: 137 mEq/L (ref 135–145)
TOTAL PROTEIN: 7.1 g/dL (ref 6.0–8.3)

## 2016-08-03 LAB — POCT GLUCOSE (DEVICE FOR HOME USE)
GLUCOSE FASTING, POC: 177 mg/dL — AB (ref 70–99)
POC GLUCOSE: 177 mg/dL — AB (ref 70–99)

## 2016-08-03 MED ORDER — MECLIZINE HCL 12.5 MG PO TABS: 12.5000 mg | ORAL_TABLET | Freq: Three times a day (TID) | ORAL | 0 refills | Status: DC | PRN

## 2016-08-03 MED ORDER — CEPHALEXIN 500 MG PO CAPS: 500.0000 mg | ORAL_CAPSULE | Freq: Two times a day (BID) | ORAL | 0 refills | Status: DC

## 2016-08-03 NOTE — Telephone Encounter (Signed)
Caller name: Vaughan Basta Relationship to patient: wife Can be reached: [phone]  Reason for call: call from pt wife stating he is fuzzy and balance is off. Started 3-4 days ago but worse today. Transferred to [address] [facility] with [facility].

## 2016-08-03 NOTE — Progress Notes (Signed)
Subjective:    Patient ID: Kara Pacer, male    DOB: 1951/03/05, 65 y.o.   MRN: [id number]  HPI  Pt in for some dizziness recently. Pt states he has been off balance for a while. He states in past would get some dizziness which came on quickly and then resolve relatively quickly in 3 seconds in past after standing. This was going on for months.(no gross motor or sensory function deficits with brief episodes)  Then or past 3 days he feels more dizzy than his baseline. He states feel like if he does not have good balance at all. No room spinning. No headache. No vision changes. If dizziness is severe he feels nauseaus. No head trauma. No chest pain or palpitations. Pt sugar 180 this moring. Mild high. Pt a1-c was 9.4. No gross motor or sensory function deficits. Only reporting severe dizziness.  Pt states dizziness is constant for 3 days straight. He feels it even lying down.  Pt has had MI in past. No recent chest pain. No sob. No back pain.   No recent new medications.      Review of Systems  Constitutional: Negative for chills, fatigue and fever.  HENT: Positive for rhinorrhea and sinus pressure.        Past week.  Eyes: Negative for pain and visual disturbance.  Respiratory: Negative for cough, chest tightness, shortness of breath and wheezing.   Cardiovascular: Negative for chest pain and palpitations.  Gastrointestinal: Negative for abdominal pain.  Musculoskeletal: Negative for back pain.  Skin: Negative for pallor and rash.  Neurological: Positive for dizziness. Negative for tremors, seizures, syncope, facial asymmetry, speech difficulty, weakness, light-headedness, numbness and headaches.  Hematological: Negative for adenopathy. Does not bruise/bleed easily.  Psychiatric/Behavioral: Negative for behavioral problems, confusion, sleep disturbance and suicidal ideas.    Past Medical History:  Diagnosis Date  . Arthritis    "knees probably" (12/22/2012)  . CAD (coronary  artery disease)    a. NSTEMI 12/12: EF 40-45%. [id number] balloon PTCA + Promus DES x 1 to mid LAD;  b. 11/2012: Cath with Cutting balloon PTCA  LAD for ISR to LAD, EF 55%;  c. 12/2012 Cath/PCI: LAD stents patent, 80 ost Diag (jailed)->PTCA, LCX/RCA patent.  . Exertional shortness of breath   . HTN (hypertension)   . Hyperlipidemia   . Ischemic cardiomyopathy    a.  echo 08/06/11: dist ant wall, apical and septal and infero-apical HK, mild LVH, EF 40%, mild LAE, PASP 34, asc aorta mildly dilated, mild TR;  b.  Echo (3/13) showed recovery of LV systolic function with EF 0000000, grade I diastolic dysfunction, mild MR.   . Type II diabetes mellitus ([place]) dx in the 90s     Social History   Social History  . Marital status: Married    Spouse name: N/A  . Number of children: 2   . Years of education: N/A   Occupational History  . retired--maintenance tech    Social History Main Topics  . Smoking status: Former Smoker    Packs/day: 1.00    Years: 25.00    Types: Cigarettes    Quit date: 04/12/1992  . Smokeless tobacco: Never Used  . Alcohol use Yes     Comment: 12/22/2012 "rarely maybe every 3-4 months, beer"  . Drug use: No  . Sexual activity: Yes   Other Topics Concern  . Not on file   Social History Narrative   Moved back to [place] from [place]  Household-- pt and wife    Past Surgical History:  Procedure Laterality Date  . ANTERIOR CERVICAL DECOMP/DISCECTOMY FUSION  in the 90s  . CORONARY ANGIOPLASTY  12/15/2012; 12/22/2012  . CORONARY ANGIOPLASTY WITH STENT PLACEMENT  07/2011   "1" (12/22/2012)  . LEFT HEART CATHETERIZATION WITH CORONARY ANGIOGRAM N/A 08/06/2011   Procedure: LEFT HEART CATHETERIZATION WITH CORONARY ANGIOGRAM;  Surgeon: Burnell Blanks, MD;  Location: [facility] CATH LAB;  Service: Cardiovascular;  Laterality: N/A;  . LEFT HEART CATHETERIZATION WITH CORONARY ANGIOGRAM N/A 12/15/2012   Procedure: LEFT HEART CATHETERIZATION WITH CORONARY ANGIOGRAM;  Surgeon: Sherren Mocha, MD;  Location: [facility] CATH LAB;  Service: Cardiovascular;  Laterality: N/A;  . LEFT HEART CATHETERIZATION WITH CORONARY ANGIOGRAM N/A 12/22/2012   Procedure: LEFT HEART CATHETERIZATION WITH CORONARY ANGIOGRAM;  Surgeon: Sherren Mocha, MD;  Location: [facility] CATH LAB;  Service: Cardiovascular;  Laterality: N/A;  . PERCUTANEOUS CORONARY STENT INTERVENTION (PCI-S) Right 12/15/2012   Procedure: PERCUTANEOUS CORONARY STENT INTERVENTION (PCI-S);  Surgeon: Sherren Mocha, MD;  Location: [facility] CATH LAB;  Service: Cardiovascular;  Laterality: Right;    Family History  Problem Relation Age of Onset  . Diabetes Brother   . Cancer Brother   . Diabetes Mother   . Coronary artery disease Father   . Heart attack Father 2  . Diabetes Sister   . Colon cancer Neg Hx   . Prostate cancer Neg Hx   . Stroke Neg Hx     Allergies  Allergen Reactions  . Trulicity [Dulaglutide] Nausea And Vomiting    Current Outpatient Prescriptions on File Prior to Visit  Medication Sig Dispense Refill  . acetaminophen (TYLENOL) 325 MG tablet Take 650 mg by mouth every 6 (six) hours as needed for pain.    . Albiglutide (TANZEUM) 50 MG PEN Inject 50 mg on the skin weekly 4 each 5  . aspirin EC 81 MG tablet Take 81 mg by mouth daily.    Marland Kitchen atorvastatin (LIPITOR) 80 MG tablet Take 1 tablet (80 mg total) by mouth daily. 90 tablet 3  . BD PEN NEEDLE NANO U/F 32G X 4 MM MISC USE FOR INJECTIONS FOUR TIMES DAILY 300 each 1  . BD PEN NEEDLE NANO U/F 32G X 4 MM MISC USE FOR INJECTIONS FOUR TIMES DAILY 300 each 0  . BD PEN NEEDLE NANO U/F 32G X 4 MM MISC USE FOR INJECTIONS FOUR TIMES DAILY 300 each 0  . carvedilol (COREG) 12.5 MG tablet TAKE 1 TABLET BY MOUTH TWICE DAILY 60 tablet 11  . citalopram (CELEXA) 20 MG tablet Take 1 tablet (20 mg total) by mouth daily. 90 tablet 1  . clopidogrel (PLAVIX) 75 MG tablet TAKE 1 TABLET BY MOUTH EVERY DAY 90 tablet 3  . clotrimazole-betamethasone (LOTRISONE) cream Apply topically 2 (two) times daily.  30 g 0  . Exenatide ER (BYDUREON) 2 MG PEN Inject 2 mg weekly under skin 4 each 5  . famotidine (PEPCID) 20 MG tablet Take 20 mg by mouth 2 (two) times daily as needed for heartburn.    . fenofibrate (TRICOR) 145 MG tablet TAKE 1 TABLET BY MOUTH EVERY DAY 30 tablet 11  . glucose blood test strip Use 4x a day 200 each 11  . insulin aspart (NOVOLOG FLEXPEN) 100 UNIT/ML FlexPen ADMINISTER 25 TO 35 UNITS UNDER THE SKIN THREE TIMES DAILY WITH MEALS 30 mL 11  . Insulin Glargine (BASAGLAR KWIKPEN) 100 UNIT/ML SOPN Inject 0.8 mLs (80 Units total) into the skin at bedtime. 10 pen 5  .  JARDIANCE 25 MG TABS tablet TAKE 1 TABLET BY MOUTH DAILY 30 tablet 5  . lisinopril (PRINIVIL,ZESTRIL) 10 MG tablet Take 1 tablet (10 mg total) by mouth 2 (two) times daily. 60 tablet 11  . MAGNESIUM PO Take 1 tablet by mouth daily.    . metFORMIN (GLUCOPHAGE-XR) 500 MG 24 hr tablet TAKE 1 TABLET(500 MG) BY MOUTH THREE TIMES DAILY 90 tablet 0  . Multiple Vitamin (MULTIVITAMIN WITH MINERALS) TABS Take 1 tablet by mouth daily.    . nitroGLYCERIN (NITROSTAT) 0.4 MG SL tablet Place 1 tablet (0.4 mg total) under the tongue every 5 (five) minutes as needed for chest pain. 100 tablet 3  . NOVOLOG FLEXPEN 100 UNIT/ML FlexPen ADMINISTER 25 TO 35 UNITS UNDER THE SKIN THREE TIMES DAILY WITH MEALS 30 mL 0  . omega-3 acid ethyl esters (LOVAZA) 1 g capsule Take 2 capsules (2 g total) by mouth 2 (two) times daily. 120 capsule 11  . spironolactone (ALDACTONE) 25 MG tablet TAKE 1/2 TABLET BY MOUTH EVERY DAY 45 tablet 3   No current facility-administered medications on file prior to visit.     BP 123/66 (BP Location: Left Arm, Patient Position: Sitting, Cuff Size: Normal)   Pulse 78   Ht 6' 2.5" (1.892 m)   Wt 287 lb 9.6 oz (130.5 kg)   SpO2 96%   BMI 36.43 kg/m       Objective:   Physical Exam  General Mental Status- Alert. General Appearance- Not in acute distress.   Skin General: Color- Normal Color. Moisture- Normal  Moisture.  Neck Carotid Arteries- Normal color. Moisture- Normal Moisture. No carotid bruits. No JVD.  Chest and Lung Exam Auscultation: Breath Sounds:-Normal.  Cardiovascular Auscultation:Rythm- Regular. Murmurs & Other Heart Sounds:Auscultation of the heart reveals- No Murmurs.  Abdomen Inspection:-Inspeection Normal. Palpation/Percussion:Note:No mass. Palpation and Percussion of the abdomen reveal- Non Tender, Non Distended + BS, no rebound or guarding.    Neurologic Cranial Nerve exam:- CN III-XII intact(No nystagmus), symmetric smile. Drift Test:- No drift. Romberg Exam:- Negative.  Heal to Toe Gait exam:-poor heel to toe gait.(after pt had meclizine his gait appeared much steadier though he did not demonstrate heel to toe). But walking out of office looked much steadier. Finger to Nose:- Normal/Intact Strength:- 5/5 equal and symmetric strength both upper and lower extremities.      Assessment & Plan:  For your dizziness recently which is moderate to severe I want you to get stat labs today and will send you downstairs to get ct of your head. Please stay down there until I get call on your results. Then will discuss results of CT with you.  You can stop by the pharmacy and get the meclizine tab. Will see if this helps with your dizziness.  I have not yet decided if we need you to get MRI. Will see how you do with meclizine tablet and follow CT results.  With your history of htn, diabetes and hyperlipidemia we need to be cautious. May send you to MRI.  Important to note that even if you have negative ct but develop worse sign or symptoms as discussed  then MRI would be indicated. In such event advise going to main ED at [facility].  Follow up after after ct imaging.  Post CT. Pt feels like he has less dizziness now. About 50% less.  Recent sinus pressure and sinus disease on ct. Will rx cephalexin for 10 days.  I asked pt to follow up in 2 days to make  sure he is still  stable and improving in light of his risk factors. Pt states he will schedule the follow up.  Ceasia Elwell, Percell Miller, PA-C

## 2016-08-03 NOTE — Telephone Encounter (Signed)
[facility] - Client [place] [facility]  Patient Name: Randy Castro  DOB: December 03, 1950    Initial Comment Husband is dizzy and balance is off. started 3-4 days ago and it is worse today Trouble walking.    Nurse Assessment  Nurse: Wayne Sever, RN, Tillie Rung Date/Time (Eastern Time): 08/03/2016 10:50:06 AM  Confirm and document reason for call. If symptomatic, describe symptoms. ---Wife states he has been dizzy for about 3 or 4 days. She was able to put him on the phone and I spoke with him. He states he is just dizzy. Denies any weakness or numbness. Denies any pain.  Does the patient have any new or worsening symptoms? ---Yes  Will a triage be completed? ---Yes  Related visit to physician within the last 2 weeks? ---No  Does the PT have any chronic conditions? (i.e. diabetes, asthma, etc.) ---Yes  List chronic conditions. ---Diabetes, Past MI's,  Is this a behavioral health or substance abuse call? ---No     Guidelines    Guideline Title Affirmed Question Affirmed Notes  Dizziness - Vertigo [1] Dizziness (vertigo) present now AND [2] one or more stroke risk factors (i.e., hypertension, diabetes, prior stroke/TIA/heart attack) (Exception: prior physician evaluation for this AND no different/worse than usual)    Final Disposition User   Go to ED Now (or PCP triage) Wayne Sever, RN, Tillie Rung    Comments  Scheduled with Mackie Pai today at 245p   Referrals  REFERRED TO PCP OFFICE   Disagree/Comply: Comply

## 2016-08-03 NOTE — Telephone Encounter (Signed)
Will you call pt tomorrow Tuesday 12th and see how he is feeling. Is he still dizzy? Improved?

## 2016-08-03 NOTE — Progress Notes (Signed)
Pre visit review using our clinic review tool, if applicable. No additional management support is needed unless otherwise documented below in the visit note. 

## 2016-08-03 NOTE — Telephone Encounter (Signed)
Noted. Pt has an appt scheduled with Mackie Pai, PA-C today at 2:45pm.

## 2016-08-03 NOTE — Telephone Encounter (Signed)
I placed that ct of head stat in.

## 2016-08-03 NOTE — Patient Instructions (Addendum)
For your dizziness recently which is moderate to severe I want you to get stat labs today and will send you downstairs to get ct of your head. Please stay down there until I get call on your results. Then will discuss results of CT with you.  You can stop by the pharmacy and get the meclizine tab. Will see if this helps with your dizziness.  I have not yet decided if we need you to get MRI. Will see how you do with meclizine tablet and follow CT results.  With your history of htn, diabetes and hyperlipidemia we need to be cautious. May send you to MRI.  Important to note that even if you have negative ct but develop worse sign or symptoms as discussed  then MRI would be indicated. In such event advise going to main ED at [facility].  Follow up after after ct imaging.  Post CT. Pt feels like he has less dizziness now. About 50% less.  Recent sinus pressure and sinus disease on ct. Will rx cephalexin for 10 days.  I asked pt to follow up in 2 days to make sure he is still stable and improving in light of his risk factors. Pt states he will schedule the follow up.

## 2016-08-04 NOTE — Addendum Note (Signed)
Addended by: Tasia Catchings on: 08/04/2016 11:51 AM   Modules accepted: Orders

## 2016-08-04 NOTE — Telephone Encounter (Addendum)
Called to follow up with patient. Pt states he's starting to feel somewhat better.  Dizziness still present, but not as bad.  He's taking medication as prescribed and plans to follow up with Percell Miller tomorrow (08/05/16) at 2 pm.  Lab results were also reviewed.  He stated understanding. No further needs at this time.

## 2016-08-05 ENCOUNTER — Encounter: Payer: Self-pay | Admitting: Medical

## 2016-08-05 ENCOUNTER — Ambulatory Visit (INDEPENDENT_AMBULATORY_CARE_PROVIDER_SITE_OTHER): Payer: BC Managed Care – PPO | Admitting: Medical

## 2016-08-05 VITALS — BP 120/66 | HR 67 | Temp 98.1°F | Ht 74.5 in | Wt 287.6 lb

## 2016-08-05 DIAGNOSIS — J0100 Acute maxillary sinusitis, unspecified: Secondary | ICD-10-CM

## 2016-08-05 DIAGNOSIS — R42 Dizziness and giddiness: Secondary | ICD-10-CM | POA: Diagnosis not present

## 2016-08-05 NOTE — Progress Notes (Signed)
Subjective:    Patient ID: Randy Castro, male    DOB: 1951/05/26, 65 y.o.   MRN: [id number]  HPI  Pt in states he is about 85% better now with his dizziness. No gross motor or sensory function deficits. Better progessivley since he left he is feeling better each day. Pt sinus still feels mild pressure. Faint left ear pressure now. No other upper respiratory type symptoms. He did start keflex which I wrote after Ct findings. Labs were negative.   Review of Systems  Constitutional: Negative for chills, fatigue and fever.  Respiratory: Negative for cough, chest tightness, shortness of breath and wheezing.   Cardiovascular: Negative for chest pain and palpitations.  Gastrointestinal: Negative for abdominal pain.  Neurological: Negative for dizziness, seizures, syncope, weakness and headaches.       Much better.  Hematological: Negative for adenopathy. Does not bruise/bleed easily.  Psychiatric/Behavioral: Negative for behavioral problems and confusion.   Past Medical History:  Diagnosis Date  . Arthritis    "knees probably" (12/22/2012)  . CAD (coronary artery disease)    a. NSTEMI 12/12: EF 40-45%. [id number] balloon PTCA + Promus DES x 1 to mid LAD;  b. 11/2012: Cath with Cutting balloon PTCA  LAD for ISR to LAD, EF 55%;  c. 12/2012 Cath/PCI: LAD stents patent, 80 ost Diag (jailed)->PTCA, LCX/RCA patent.  . Exertional shortness of breath   . HTN (hypertension)   . Hyperlipidemia   . Ischemic cardiomyopathy    a.  echo 08/06/11: dist ant wall, apical and septal and infero-apical HK, mild LVH, EF 40%, mild LAE, PASP 34, asc aorta mildly dilated, mild TR;  b.  Echo (3/13) showed recovery of LV systolic function with EF 0000000, grade I diastolic dysfunction, mild MR.   . Type II diabetes mellitus ([place]) dx in the 90s     Social History   Social History  . Marital status: Married    Spouse name: N/A  . Number of children: 2   . Years of education: N/A   Occupational History  .  retired--maintenance tech    Social History Main Topics  . Smoking status: Former Smoker    Packs/day: 1.00    Years: 25.00    Types: Cigarettes    Quit date: 04/12/1992  . Smokeless tobacco: Never Used  . Alcohol use Yes     Comment: 12/22/2012 "rarely maybe every 3-4 months, beer"  . Drug use: No  . Sexual activity: Yes   Other Topics Concern  . Not on file   Social History Narrative   Moved back to [place] from [place]-- pt and wife    Past Surgical History:  Procedure Laterality Date  . ANTERIOR CERVICAL DECOMP/DISCECTOMY FUSION  in the 90s  . CORONARY ANGIOPLASTY  12/15/2012; 12/22/2012  . CORONARY ANGIOPLASTY WITH STENT PLACEMENT  07/2011   "1" (12/22/2012)  . LEFT HEART CATHETERIZATION WITH CORONARY ANGIOGRAM N/A 08/06/2011   Procedure: LEFT HEART CATHETERIZATION WITH CORONARY ANGIOGRAM;  Surgeon: Burnell Blanks, MD;  Location: [facility] CATH LAB;  Service: Cardiovascular;  Laterality: N/A;  . LEFT HEART CATHETERIZATION WITH CORONARY ANGIOGRAM N/A 12/15/2012   Procedure: LEFT HEART CATHETERIZATION WITH CORONARY ANGIOGRAM;  Surgeon: Sherren Mocha, MD;  Location: [facility] CATH LAB;  Service: Cardiovascular;  Laterality: N/A;  . LEFT HEART CATHETERIZATION WITH CORONARY ANGIOGRAM N/A 12/22/2012   Procedure: LEFT HEART CATHETERIZATION WITH CORONARY ANGIOGRAM;  Surgeon: Sherren Mocha, MD;  Location: [facility] CATH LAB;  Service: Cardiovascular;  Laterality: N/A;  .  PERCUTANEOUS CORONARY STENT INTERVENTION (PCI-S) Right 12/15/2012   Procedure: PERCUTANEOUS CORONARY STENT INTERVENTION (PCI-S);  Surgeon: Sherren Mocha, MD;  Location: [facility] CATH LAB;  Service: Cardiovascular;  Laterality: Right;    Family History  Problem Relation Age of Onset  . Diabetes Brother   . Cancer Brother   . Diabetes Mother   . Coronary artery disease Father   . Heart attack Father 18  . Diabetes Sister   . Colon cancer Neg Hx   . Prostate cancer Neg Hx   . Stroke Neg Hx     Allergies  Allergen Reactions    . Trulicity [Dulaglutide] Nausea And Vomiting    Current Outpatient Prescriptions on File Prior to Visit  Medication Sig Dispense Refill  . acetaminophen (TYLENOL) 325 MG tablet Take 650 mg by mouth every 6 (six) hours as needed for pain.    . Albiglutide (TANZEUM) 50 MG PEN Inject 50 mg on the skin weekly 4 each 5  . aspirin EC 81 MG tablet Take 81 mg by mouth daily.    Marland Kitchen atorvastatin (LIPITOR) 80 MG tablet Take 1 tablet (80 mg total) by mouth daily. 90 tablet 3  . BD PEN NEEDLE NANO U/F 32G X 4 MM MISC USE FOR INJECTIONS FOUR TIMES DAILY 300 each 1  . BD PEN NEEDLE NANO U/F 32G X 4 MM MISC USE FOR INJECTIONS FOUR TIMES DAILY 300 each 0  . BD PEN NEEDLE NANO U/F 32G X 4 MM MISC USE FOR INJECTIONS FOUR TIMES DAILY 300 each 0  . carvedilol (COREG) 12.5 MG tablet TAKE 1 TABLET BY MOUTH TWICE DAILY 60 tablet 11  . cephALEXin (KEFLEX) 500 MG capsule Take 1 capsule (500 mg total) by mouth 2 (two) times daily. 20 capsule 0  . citalopram (CELEXA) 20 MG tablet Take 1 tablet (20 mg total) by mouth daily. 90 tablet 1  . clopidogrel (PLAVIX) 75 MG tablet TAKE 1 TABLET BY MOUTH EVERY DAY 90 tablet 3  . clotrimazole-betamethasone (LOTRISONE) cream Apply topically 2 (two) times daily. 30 g 0  . Exenatide ER (BYDUREON) 2 MG PEN Inject 2 mg weekly under skin 4 each 5  . famotidine (PEPCID) 20 MG tablet Take 20 mg by mouth 2 (two) times daily as needed for heartburn.    . fenofibrate (TRICOR) 145 MG tablet TAKE 1 TABLET BY MOUTH EVERY DAY 30 tablet 11  . glucose blood test strip Use 4x a day 200 each 11  . insulin aspart (NOVOLOG FLEXPEN) 100 UNIT/ML FlexPen ADMINISTER 25 TO 35 UNITS UNDER THE SKIN THREE TIMES DAILY WITH MEALS 30 mL 11  . Insulin Glargine (BASAGLAR KWIKPEN) 100 UNIT/ML SOPN Inject 0.8 mLs (80 Units total) into the skin at bedtime. 10 pen 5  . JARDIANCE 25 MG TABS tablet TAKE 1 TABLET BY MOUTH DAILY 30 tablet 5  . lisinopril (PRINIVIL,ZESTRIL) 10 MG tablet Take 1 tablet (10 mg total) by  mouth 2 (two) times daily. 60 tablet 11  . MAGNESIUM PO Take 1 tablet by mouth daily.    . meclizine (ANTIVERT) 12.5 MG tablet Take 1 tablet (12.5 mg total) by mouth 3 (three) times daily as needed for dizziness. 30 tablet 0  . metFORMIN (GLUCOPHAGE-XR) 500 MG 24 hr tablet TAKE 1 TABLET(500 MG) BY MOUTH THREE TIMES DAILY 90 tablet 0  . Multiple Vitamin (MULTIVITAMIN WITH MINERALS) TABS Take 1 tablet by mouth daily.    . nitroGLYCERIN (NITROSTAT) 0.4 MG SL tablet Place 1 tablet (0.4 mg total) under the tongue  every 5 (five) minutes as needed for chest pain. 100 tablet 3  . NOVOLOG FLEXPEN 100 UNIT/ML FlexPen ADMINISTER 25 TO 35 UNITS UNDER THE SKIN THREE TIMES DAILY WITH MEALS 30 mL 0  . omega-3 acid ethyl esters (LOVAZA) 1 g capsule Take 2 capsules (2 g total) by mouth 2 (two) times daily. 120 capsule 11  . spironolactone (ALDACTONE) 25 MG tablet TAKE 1/2 TABLET BY MOUTH EVERY DAY 45 tablet 3   No current facility-administered medications on file prior to visit.     BP 120/66 (BP Location: Left Arm, Patient Position: Sitting, Cuff Size: Normal)   Pulse 67   Temp 98.1 F (36.7 C) (Oral)   Ht 6' 2.5" (1.892 m)   Wt 287 lb 9.6 oz (130.5 kg)   SpO2 98%   BMI 36.43 kg/m       Objective:   Physical Exam  General Mental Status- Alert. General Appearance- Not in acute distress.   Skin General: Color- Normal Color. Moisture- Normal Moisture.  Neck Carotid Arteries- Normal color. Moisture- Normal Moisture. No carotid bruits. No JVD.  Chest and Lung Exam Auscultation: Breath Sounds:-Normal.  Cardiovascular Auscultation:Rythm- Regular. Murmurs & Other Heart Sounds:Auscultation of the heart reveals- No Murmurs.  Abdomen Inspection:-Inspeection Normal. Palpation/Percussion:Note:No mass. Palpation and Percussion of the abdomen reveal- Non Tender, Non Distended + BS, no rebound or guarding.    Neurologic Cranial Nerve exam:- CN III-XII intact(No nystagmus), symmetric  smile. Strength:- 5/5 equal and symmetric strength both upper and lower extremities. Pt demonstrates walking today and he has much steadier gate. Not off balance at all.  Heent- no sinus pressure today(but sounds mild congested nasally). Left tm looks clear. Tm normal.      Assessment & Plan:  Your dizziness has improved a great deal with the meclizine. Can continue every 8 hours use until tomorrow. Then just use every 8 hrs as needed. Still watch for worse symptoms or new symptoms associated with dizziness. In that event recommend ED evaluation at [facility]. But I think the dizziness will gradually taper off completely  in about 3-5 days.  For sinus infection continue the keflex.  Follow up as regularly scheduled with pcp or as needed for persistent or recurrent symptoms.

## 2016-08-05 NOTE — Patient Instructions (Addendum)
Your dizziness has improved a great deal with the meclizine. Can continue every 8 hours use until tomorrow. Then just use every 8 hrs as needed. Still watch for worse symptoms or new symptoms associated with dizziness. In that event recommend ED evaluation at [facility]. But I think the dizziness will gradually taper off completely in about 3-5 days.  For sinus infection continue the keflex.  Follow up as regularly scheduled with pcp or as needed for persistent or recurrent symptoms.

## 2016-08-13 ENCOUNTER — Other Ambulatory Visit: Payer: Self-pay | Admitting: Internal Medicine

## 2016-08-25 ENCOUNTER — Ambulatory Visit: Payer: BC Managed Care – PPO | Admitting: Internal Medicine

## 2016-09-03 ENCOUNTER — Ambulatory Visit (INDEPENDENT_AMBULATORY_CARE_PROVIDER_SITE_OTHER): Payer: PPO | Admitting: Internal Medicine

## 2016-09-03 ENCOUNTER — Encounter: Payer: Self-pay | Admitting: Internal Medicine

## 2016-09-03 VITALS — BP 118/76 | HR 67 | Temp 98.2°F | Resp 14 | Ht 75.0 in | Wt 289.0 lb

## 2016-09-03 DIAGNOSIS — E118 Type 2 diabetes mellitus with unspecified complications: Secondary | ICD-10-CM | POA: Diagnosis not present

## 2016-09-03 DIAGNOSIS — I10 Essential (primary) hypertension: Secondary | ICD-10-CM

## 2016-09-03 NOTE — Progress Notes (Signed)
Subjective:    Patient ID: Randy Castro, male    DOB: Nov 04, 1950, 66 y.o.   MRN: [id number]  DOS:  09/03/2016 Type of visit - description : rov Interval history: DM: Unable to get Bydureon due to insurance constraints, admits to poor diet throughout the Christmas season, in the last few days is better. c/o dizziness, was seen by another provider,  chart reviewed, CT head negative, CBC normal. He is already getting better. Symptoms are described as a spinning triggered by turning his head to the left when he is in bed.   Review of Systems Denies chest pain difficulty breathing No nausea, vomiting or diarrhea No diplopia, slurred speech or motor deficits  Past Medical History:  Diagnosis Date  . Arthritis    "knees probably" (12/22/2012)  . CAD (coronary artery disease)    a. NSTEMI 12/12: EF 40-45%. [id number] balloon PTCA + Promus DES x 1 to mid LAD;  b. 11/2012: Cath with Cutting balloon PTCA  LAD for ISR to LAD, EF 55%;  c. 12/2012 Cath/PCI: LAD stents patent, 80 ost Diag (jailed)->PTCA, LCX/RCA patent.  . Exertional shortness of breath   . HTN (hypertension)   . Hyperlipidemia   . Ischemic cardiomyopathy    a.  echo 08/06/11: dist ant wall, apical and septal and infero-apical HK, mild LVH, EF 40%, mild LAE, PASP 34, asc aorta mildly dilated, mild TR;  b.  Echo (3/13) showed recovery of LV systolic function with EF 0000000, grade I diastolic dysfunction, mild MR.   . Type II diabetes mellitus ([place]) dx in the 90s    Past Surgical History:  Procedure Laterality Date  . ANTERIOR CERVICAL DECOMP/DISCECTOMY FUSION  in the 90s  . CORONARY ANGIOPLASTY  12/15/2012; 12/22/2012  . CORONARY ANGIOPLASTY WITH STENT PLACEMENT  07/2011   "1" (12/22/2012)  . LEFT HEART CATHETERIZATION WITH CORONARY ANGIOGRAM N/A 08/06/2011   Procedure: LEFT HEART CATHETERIZATION WITH CORONARY ANGIOGRAM;  Surgeon: Burnell Blanks, MD;  Location: [facility] CATH LAB;  Service: Cardiovascular;  Laterality: N/A;  . LEFT  HEART CATHETERIZATION WITH CORONARY ANGIOGRAM N/A 12/15/2012   Procedure: LEFT HEART CATHETERIZATION WITH CORONARY ANGIOGRAM;  Surgeon: Sherren Mocha, MD;  Location: [facility] CATH LAB;  Service: Cardiovascular;  Laterality: N/A;  . LEFT HEART CATHETERIZATION WITH CORONARY ANGIOGRAM N/A 12/22/2012   Procedure: LEFT HEART CATHETERIZATION WITH CORONARY ANGIOGRAM;  Surgeon: Sherren Mocha, MD;  Location: [facility] CATH LAB;  Service: Cardiovascular;  Laterality: N/A;  . PERCUTANEOUS CORONARY STENT INTERVENTION (PCI-S) Right 12/15/2012   Procedure: PERCUTANEOUS CORONARY STENT INTERVENTION (PCI-S);  Surgeon: Sherren Mocha, MD;  Location: [facility] CATH LAB;  Service: Cardiovascular;  Laterality: Right;    Social History   Social History  . Marital status: Married    Spouse name: N/A  . Number of children: 2   . Years of education: N/A   Occupational History  . retired--maintenance tech    Social History Main Topics  . Smoking status: Former Smoker    Packs/day: 1.00    Years: 25.00    Types: Cigarettes    Quit date: 04/12/1992  . Smokeless tobacco: Never Used  . Alcohol use Yes     Comment: 12/22/2012 "rarely maybe every 3-4 months, beer"  . Drug use: No  . Sexual activity: Yes   Other Topics Concern  . Not on file   Social History Narrative   Moved back to [place] from [place]-- pt and wife      Allergies as of 09/03/2016  Reactions   Trulicity [dulaglutide] Nausea And Vomiting      Medication List       Accurate as of 09/03/16  4:18 PM. Always use your most recent med list.          acetaminophen 325 MG tablet Commonly known as:  TYLENOL Take 650 mg by mouth every 6 (six) hours as needed for pain.   Albiglutide 50 MG Pen Commonly known as:  TANZEUM Inject 50 mg on the skin weekly   aspirin EC 81 MG tablet Take 81 mg by mouth daily.   atorvastatin 80 MG tablet Commonly known as:  LIPITOR Take 1 tablet (80 mg total) by mouth daily.   BASAGLAR KWIKPEN 100 UNIT/ML  Sopn Inject 0.8 mLs (80 Units total) into the skin at bedtime.   BD PEN NEEDLE NANO U/F 32G X 4 MM Misc Generic drug:  Insulin Pen Needle USE FOR INJECTIONS FOUR TIMES DAILY   carvedilol 12.5 MG tablet Commonly known as:  COREG TAKE 1 TABLET BY MOUTH TWICE DAILY   citalopram 20 MG tablet Commonly known as:  CELEXA Take 1 tablet (20 mg total) by mouth daily.   clopidogrel 75 MG tablet Commonly known as:  PLAVIX TAKE 1 TABLET BY MOUTH EVERY DAY   clotrimazole-betamethasone cream Commonly known as:  LOTRISONE Apply topically 2 (two) times daily.   famotidine 20 MG tablet Commonly known as:  PEPCID Take 20 mg by mouth 2 (two) times daily as needed for heartburn.   fenofibrate 145 MG tablet Commonly known as:  TRICOR TAKE 1 TABLET BY MOUTH EVERY DAY   glucose blood test strip Use 4x a day   JARDIANCE 25 MG Tabs tablet Generic drug:  empagliflozin TAKE 1 TABLET BY MOUTH DAILY   lisinopril 10 MG tablet Commonly known as:  PRINIVIL,ZESTRIL Take 1 tablet (10 mg total) by mouth 2 (two) times daily.   MAGNESIUM PO Take 1 tablet by mouth daily.   meclizine 12.5 MG tablet Commonly known as:  ANTIVERT Take 1 tablet (12.5 mg total) by mouth 3 (three) times daily as needed for dizziness.   metFORMIN 500 MG 24 hr tablet Commonly known as:  GLUCOPHAGE-XR TAKE 1 TABLET(500 MG) BY MOUTH THREE TIMES DAILY   multivitamin with minerals Tabs tablet Take 1 tablet by mouth daily.   nitroGLYCERIN 0.4 MG SL tablet Commonly known as:  NITROSTAT Place 1 tablet (0.4 mg total) under the tongue every 5 (five) minutes as needed for chest pain.   insulin aspart 100 UNIT/ML FlexPen Commonly known as:  NOVOLOG FLEXPEN ADMINISTER 25 TO 35 UNITS UNDER THE SKIN THREE TIMES DAILY WITH MEALS   NOVOLOG FLEXPEN 100 UNIT/ML FlexPen Generic drug:  insulin aspart ADMINISTER 25 TO 35 UNITS UNDER THE SKIN THREE TIMES DAILY WITH MEALS   omega-3 acid ethyl esters 1 g capsule Commonly known as:   LOVAZA Take 2 capsules (2 g total) by mouth 2 (two) times daily.   spironolactone 25 MG tablet Commonly known as:  ALDACTONE TAKE 1/2 TABLET BY MOUTH EVERY DAY          Objective:   Physical Exam BP 118/76 (BP Location: Left Arm, Patient Position: Sitting, Cuff Size: Normal)   Pulse 67   Temp 98.2 F (36.8 C) (Oral)   Resp 14   Ht 6\' 3"  (1.905 m)   Wt 289 lb (131.1 kg)   SpO2 98%   BMI 36.12 kg/m  General:   Well developed, well nourished . NAD.  HEENT:  Normocephalic .  Face symmetric, atraumatic Lungs:  CTA B Normal respiratory effort, no intercostal retractions, no accessory muscle use. Heart: RRR,  no murmur.  No pretibial edema bilaterally  Skin: Not pale. Not jaundice Neurologic:  alert & oriented X3.  Speech normal, gait appropriate for age and unassisted Psych--  Cognition and judgment appear intact.  Cooperative with normal attention span and concentration.  Behavior appropriate. No anxious or depressed appearing.      Assessment & Plan:   Assessment   DM -- insulin dependent, uncontrolled, with complications, CAD CHF. Dr Cruzita Lederer HTN Hyperlipidemia CAD, CHF, ischemic cardiomyopathy NSTEMI in 12/12, unstable angina in 4/14 and 5/14. He had DES to mid LAD in 12/12. EF was 40% with apical hypokinesis at the time of the MI. Repeat echo in 3/13 showed EF 55-60%. In 4/14, he was admitted with unstable angina and had 90% pLAD in-stent restenosis. This was treated with cutting balloon angioplasty. Readmitted, again with unstable angina, in 5/14. This time he had PTCA to 90% ostial D1 stenosis.  Anxiety-depression Skin cancer: Right leg, status post excision 2016; Dr Allyson Sabal  PLAN: DM: Poorly controlled, unable to get Bydureon. Admits to a poor diet. Encourage pt to eat healthier, without a healthy lifestyle he will continue to need multiple very expensive medications. Declined  nutritionist referral, "I know what to do". Reminded of risk d/t poorly  controlled DM HTN: Controlled on lisinopril, carvedilol. Dizziness: Likely a peripheral issue, getting better, we talk about PT referral for vestibular rehabilitation. Declined but will call if needed. RTC CPX in 4-5 months.   Today, I spent more than  18  min with the patient: >50% of the time counseling regards diet, risk from uncontrolled DM, reviewing the chart

## 2016-09-03 NOTE — Assessment & Plan Note (Signed)
DM: Poorly controlled, unable to get Bydureon. Admits to a poor diet. Encourage pt to eat healthier, without a healthy lifestyle he will continue to need multiple very expensive medications. Declined  nutritionist referral, "I know what to do". Reminded of risk d/t poorly controlled DM HTN: Controlled on lisinopril, carvedilol. Dizziness: Likely a peripheral issue, getting better, we talk about PT referral for vestibular rehabilitation. Declined but will call if needed. RTC CPX in 4-5 months.

## 2016-09-03 NOTE — Patient Instructions (Signed)
GO TO THE FRONT DESK Schedule your next appointment for a  Physical exam in about 4-5 months

## 2016-09-03 NOTE — Progress Notes (Signed)
Pre visit review using our clinic review tool, if applicable. No additional management support is needed unless otherwise documented below in the visit note. 

## 2016-09-18 ENCOUNTER — Encounter: Payer: Self-pay | Admitting: Internal Medicine

## 2016-09-18 ENCOUNTER — Ambulatory Visit (INDEPENDENT_AMBULATORY_CARE_PROVIDER_SITE_OTHER): Payer: PPO | Admitting: Internal Medicine

## 2016-09-18 VITALS — BP 134/80 | HR 74 | Ht 74.0 in | Wt 288.0 lb

## 2016-09-18 DIAGNOSIS — E1165 Type 2 diabetes mellitus with hyperglycemia: Secondary | ICD-10-CM | POA: Diagnosis not present

## 2016-09-18 DIAGNOSIS — E1159 Type 2 diabetes mellitus with other circulatory complications: Secondary | ICD-10-CM

## 2016-09-18 LAB — POCT GLYCOSYLATED HEMOGLOBIN (HGB A1C): HEMOGLOBIN A1C: 9.8

## 2016-09-18 MED ORDER — INSULIN REGULAR HUMAN 100 UNIT/ML IJ SOLN: 35.0000 [IU] | Freq: Three times a day (TID) | INTRAMUSCULAR | 11 refills | Status: DC

## 2016-09-18 MED ORDER — INSULIN NPH (HUMAN) (ISOPHANE) 100 UNIT/ML ~~LOC~~ SUSP: SUBCUTANEOUS | 11 refills | Status: DC

## 2016-09-18 MED ORDER — "INSULIN SYRINGE-NEEDLE U-100 31G X 15/64"" 0.5 ML MISC": 3 refills | Status: DC

## 2016-09-18 NOTE — Patient Instructions (Addendum)
Please stop Jardiance, Consulting civil engineer.  Continue Metformin ER 500 mg 2x a day.  Start Humulin N and R - ReliOn: Insulin Before breakfast Before lunch Before dinner  Regular (clear) 35 35 35  NPH (cloudy) 45  35  Please inject the insulin 30 min before meals.  Please return in 1.5 months with your sugar log.

## 2016-09-18 NOTE — Progress Notes (Signed)
Subjective:     Patient ID: Randy Castro, male   DOB: [birth year], 66 y.o.   MRN: [id number]  HPI Review of Systems Objective:   Physical Exam Assessment:   HPI Randy Castro is a pleasant 66 y.o. man returning for f/u for DM2, dx 1998, uncontrolled, insulin dependent, with complications (CAD, s/p MI 07/2011, s/p PTCA with DES, s/p PTCA for in-stent restenosis 12/15/12; also ED). He is here with his wife who offers part of the hx. Last visit was 4 mo ago.  Reviewed HbA1c levels:  Lab Results  Component Value Date   HGBA1C 9.4 05/25/2016   HGBA1C 8.8 01/23/2016   HGBA1C 8.2 10/23/2015   He is now on a regimen of: - Basaglar 75 units at bedtime - not covered anymore - Metformin ER 500 mg 2x a day - Jardiance 25 mg in am  - not covered anymore - NovoLog: - very expensive 25 units with a smaller meal 35 units with a large meal We tried to start Bydureon 2 g weekly 05/2016 >> not covered We stopped Glipizide when we started mealtime insulin. We stopped Bydureon when we started mealtime insulin We stopped Januvia 100 in 12/2013. He was on JanuMet 50/1000 bid >> stopped 2/2 diarrhea, fatigue >> restarted but with same SEs >> stopped. He retired on 08/15/2013 >> less activity, increased weight. Sugars have greatly increased >> we added Invokana. We had to stop this and switch to [place] per insurance coverage. He could not take maximum dose of metformin XR due to diarrhea. He could not tolerate Trulicity due to nausea and vomiting. We had to stop [place] 2/2 insurance coverage.  He checks his sugars approx 1-3/day: - am: 159-241, 272, 287 >> 155-179 >> 166-260, 292 >> 113-221, 267 >> 129-323 - after b'fast: 367 >> 158 >> n/c >> 231, 315 >> n/c  - before lunch: 184-268, 314 >> 123-223 >> 168-240 >> 129-213, 247 >> 197-283 - 2h after lunch: 143 >> n/c - before dinner: 172, 212-280 >> 142-242 >> 142, 178, 253 >> n/c >> 111-119 >> 177, 233, 303 - bedtime:  124, 131 >> 301 >> 178, 240 >> n/c  >> 140, 205 >> n/c - nighttime: 3 lows 58-60s >> n/c >> 103 No lows. He has hypoglycemia awareness at 66.  - No CKD. Last BUN/Cr:  Lab Results  Component Value Date   BUN 20 08/03/2016   CREATININE 1.05 08/03/2016  He is on lisinopril. - No neuropathy per foot exam 01/02/2016. - He has HL: Lab Results  Component Value Date   CHOL 143 05/18/2016   HDL 28 (L) 05/18/2016   LDLCALC 46 05/18/2016   LDLDIRECT 64.0 01/02/2016   TRIG 343 (H) 05/18/2016   CHOLHDL 5.1 (H) 05/18/2016  He is on Lipitor - no DR per last eye exam on 01/09/2016 >> no DR reportedly. He is on ASA 81. - Foot exam normal 12/2013.  PMH:   Pt had a period off the meds for cost reasons and was restarted on DM meds in 03/2011. In 07/2011, he had a NSTEMI and had a DES placed. He also has CHF, improved (EF increased from 40-45% in 07/2011 to 55-60% in 10/2011). Sees Dr. Aundra Dubin. He had CP/SOB >> saw Dr. Percival Spanish on 12/15/3012 >> heart catheterization >> PTCA for LAD instent restenosis.  I reviewed pt's medications, allergies, PMH, social hx, family hx, and changes were documented in the history of present illness. Otherwise, unchanged from my initial visit note.  Review of Systems  Constitutional: no weight gain, no fatigue, no subjective hyperthermia/hypothermia Eyes: no blurry vision, no xerophthalmia ENT: no sore throat, no nodules palpated in throat, no dysphagia/odynophagia, no hoarseness Cardiovascular: no CP/no palpitations/leg swelling Respiratory: no cough/SOB Gastrointestinal: no N/V/D/C/heartburn Musculoskeletal: muscle/+ joint aches (twisted knee) Skin: no rashes Neurological: no tremors/numbness/tingling/dizziness  Objective:   Physical Exam BP 134/80 (BP Location: Left Arm, Patient Position: Sitting)   Pulse 74   Ht 6\' 2"  (1.88 m)   Wt 288 lb (130.6 kg)   SpO2 94%   BMI 36.98 kg/m  Body mass index is 36.98 kg/m.  Wt Readings from Last 3 Encounters:  09/18/16 288 lb (130.6 kg)  09/03/16 289  lb (131.1 kg)  08/05/16 287 lb 9.6 oz (130.5 kg)   Constitutional: obese, in NAD Eyes: PERRLA, EOMI, no exophthalmos ENT: moist mucous membranes, no thyromegaly, no cervical lymphadenopathy Cardiovascular: RRR, No MRG Respiratory: CTA B Gastrointestinal: abdomen soft, NT, ND, BS+ Musculoskeletal: no deformities, strength intact in all 4 Skin: moist, warm, no rashes Neurological: no tremor with outstretched hands, DTR normal in all 4 Assessment:     1. DM2, insulin dependent, uncontrolled, with complications: - CAD, s/p NSTEMI 07/2011, with PTCA + DES (Dr. Aundra Dubin), s/p PTCA for in-stent restenosis 12/15/2012- CHF, EF 55-60% per 2D ECHO in 10/2011 - ED - no DR - no nephropathy  2. Obesity    Plan:     Pt with long standing uncontrolled DM despite an intensive treatment regimen. At last visit, we tried to add Bydureon, but he could not afford it. In fact, he cannot afford most of his medicines now. We'll need to switch from [place] and NovoLog to an N and R insulins. Will also need to stop Jardiance. - At all previous visits, we discussed about the need to improve his diet and start working out at the gym - he has one where he lives, but have been unsuccessful in improving sugars and weight >> last HbA1c was very high, at 9.4%. He tells me that he is now decided to start eating healthy and losing weight. Sugars in the last 3 days were, indeed, better. - I advised him to: Patient Instructions   Please stop Jardiance, Consulting civil engineer.  Continue Metformin ER 500 mg 2x a day.  Start Humulin N and R - ReliOn: Insulin Before breakfast Before lunch Before dinner  Regular (clear) 35 35 35  NPH (cloudy) 45  35  Please inject the insulin 30 min before meals.  Please return in 1.5 months with your sugar log.   - check HbA1c today >> 9.8% (even higher!) - UTD with eye exams - UTD with flu shot - return in 3 months with his sugar log.   2. Obesity - weight still high - Started  to control his diet  Philemon Kingdom, MD PhD [facility]

## 2016-09-21 NOTE — Addendum Note (Signed)
Addended by: Caprice Beaver T on: 09/21/2016 01:45 PM   Modules accepted: Orders

## 2016-09-30 ENCOUNTER — Ambulatory Visit (INDEPENDENT_AMBULATORY_CARE_PROVIDER_SITE_OTHER): Payer: PPO | Admitting: Internal Medicine

## 2016-09-30 ENCOUNTER — Encounter: Payer: Self-pay | Admitting: Internal Medicine

## 2016-09-30 ENCOUNTER — Ambulatory Visit (HOSPITAL_BASED_OUTPATIENT_CLINIC_OR_DEPARTMENT_OTHER)
Admission: RE | Admit: 2016-09-30 | Discharge: 2016-09-30 | Disposition: A | Discharge status: Home / Self Care | Payer: PPO | Source: Ambulatory Visit | Attending: Internal Medicine | Admitting: Internal Medicine

## 2016-09-30 VITALS — BP 134/78 | HR 66 | Temp 97.8°F | Resp 14 | Ht 74.0 in | Wt 286.5 lb

## 2016-09-30 DIAGNOSIS — M25562 Pain in left knee: Secondary | ICD-10-CM | POA: Diagnosis not present

## 2016-09-30 DIAGNOSIS — M7989 Other specified soft tissue disorders: Secondary | ICD-10-CM | POA: Diagnosis not present

## 2016-09-30 DIAGNOSIS — I83812 Varicose veins of left lower extremities with pain: Secondary | ICD-10-CM | POA: Insufficient documentation

## 2016-09-30 DIAGNOSIS — R600 Localized edema: Secondary | ICD-10-CM | POA: Diagnosis not present

## 2016-09-30 DIAGNOSIS — G8929 Other chronic pain: Secondary | ICD-10-CM | POA: Diagnosis not present

## 2016-09-30 MED ORDER — MELOXICAM 7.5 MG PO TABS: 7.5000 mg | ORAL_TABLET | Freq: Every day | ORAL | 0 refills | Status: DC

## 2016-09-30 NOTE — Progress Notes (Signed)
Subjective:    Patient ID: Randy Castro, male    DOB: [birth year], 66 y.o.   MRN: [id number]  DOS:  09/30/2016 Type of visit - description : Acute visit Interval history: Long history of left knee pain on and off This time the pain is started 3 weeks ago and is not getting better. Located anteriorly and posteriorly. No acute injury recently. Weightbearing definitely increased pain. No pain at rest or at night.  Review of Systems Denies fever chills Some swelling particularly of the posterior knee. No knee redness or warmness  Past Medical History:  Diagnosis Date  . Arthritis    "knees probably" (12/22/2012)  . CAD (coronary artery disease)    a. NSTEMI 12/12: EF 40-45%. [id number] balloon PTCA + Promus DES x 1 to mid LAD;  b. 11/2012: Cath with Cutting balloon PTCA  LAD for ISR to LAD, EF 55%;  c. 12/2012 Cath/PCI: LAD stents patent, 80 ost Diag (jailed)->PTCA, LCX/RCA patent.  . Exertional shortness of breath   . HTN (hypertension)   . Hyperlipidemia   . Ischemic cardiomyopathy    a.  echo 08/06/11: dist ant wall, apical and septal and infero-apical HK, mild LVH, EF 40%, mild LAE, PASP 34, asc aorta mildly dilated, mild TR;  b.  Echo (3/13) showed recovery of LV systolic function with EF 0000000, grade I diastolic dysfunction, mild MR.   . Type II diabetes mellitus ([place]) dx in the 90s    Past Surgical History:  Procedure Laterality Date  . ANTERIOR CERVICAL DECOMP/DISCECTOMY FUSION  in the 90s  . CORONARY ANGIOPLASTY  12/15/2012; 12/22/2012  . CORONARY ANGIOPLASTY WITH STENT PLACEMENT  07/2011   "1" (12/22/2012)  . LEFT HEART CATHETERIZATION WITH CORONARY ANGIOGRAM N/A 08/06/2011   Procedure: LEFT HEART CATHETERIZATION WITH CORONARY ANGIOGRAM;  Surgeon: Burnell Blanks, MD;  Location: [facility] C[facility] LAB;  Service: Cardiovascular;  Laterality: N/A;  . LEFT HEART CATHETERIZATION WITH CORONARY ANGIOGRAM N/A 12/15/2012   Procedure: LEFT HEART CATHETERIZATION WITH CORONARY ANGIOGRAM;   Surgeon: Sherren Mocha, MD;  Location: [facility] CATH LAB;  Service: Cardiovascular;  Laterality: N/A;  . LEFT HEART CATHETERIZATION WITH CORONARY ANGIOGRAM N/A 12/22/2012   Procedure: LEFT HEART CATHETERIZATION WITH CORONARY ANGIOGRAM;  Surgeon: Sherren Mocha, MD;  Location: [facility] CATH LAB;  Service: Cardiovascular;  Laterality: N/A;  . PERCUTANEOUS CORONARY STENT INTERVENTION (PCI-S) Right 12/15/2012   Procedure: PERCUTANEOUS CORONARY STENT INTERVENTION (PCI-S);  Surgeon: Sherren Mocha, MD;  Location: [facility] CATH LAB;  Service: Cardiovascular;  Laterality: Right;    Social History   Social History  . Marital status: Married    Spouse name: N/A  . Number of children: 2   . Years of education: N/A   Occupational History  . retired--maintenance tech    Social History Main Topics  . Smoking status: Former Smoker    Packs/day: 1.00    Years: 25.00    Types: Cigarettes    Quit date: 04/12/1992  . Smokeless tobacco: Never Used  . Alcohol use Yes     Comment: 12/22/2012 "rarely maybe every 3-4 months, beer"  . Drug use: No  . Sexual activity: Yes   Other Topics Concern  . Not on file   Social History Narrative   Moved back to [place] from [place]-- pt and wife      Allergies as of 09/30/2016      Reactions   Trulicity [dulaglutide] Nausea And Vomiting      Medication List  Accurate as of 09/30/16 11:59 PM. Always use your most recent med list.          acetaminophen 325 MG tablet Commonly known as:  TYLENOL Take 650 mg by mouth every 6 (six) hours as needed for pain.   Albiglutide 50 MG Pen Commonly known as:  TANZEUM Inject 50 mg on the skin weekly   aspirin EC 81 MG tablet Take 81 mg by mouth daily.   atorvastatin 80 MG tablet Commonly known as:  LIPITOR Take 1 tablet (80 mg total) by mouth daily.   BD PEN NEEDLE NANO U/F 32G X 4 MM Misc Generic drug:  Insulin Pen Needle USE FOR INJECTIONS FOUR TIMES DAILY   carvedilol 12.5 MG tablet Commonly known as:   COREG TAKE 1 TABLET BY MOUTH TWICE DAILY   citalopram 20 MG tablet Commonly known as:  CELEXA Take 1 tablet (20 mg total) by mouth daily.   clopidogrel 75 MG tablet Commonly known as:  PLAVIX TAKE 1 TABLET BY MOUTH EVERY DAY   clotrimazole-betamethasone cream Commonly known as:  LOTRISONE Apply topically 2 (two) times daily.   famotidine 20 MG tablet Commonly known as:  PEPCID Take 20 mg by mouth 2 (two) times daily as needed for heartburn.   fenofibrate 145 MG tablet Commonly known as:  TRICOR TAKE 1 TABLET BY MOUTH EVERY DAY   glucose blood test strip Use 4x a day   insulin NPH Human 100 UNIT/ML injection Commonly known as:  NOVOLIN N RELION Inject up to 80 units daily as advised   insulin regular 250 units/2.61mL (100 units/mL) injection Commonly known as:  NOVOLIN R RELION Inject 0.35 mLs (35 Units total) into the skin 3 (three) times daily before meals.   Insulin Syringe-Needle U-100 31G X 15/64" 0.5 ML Misc Commonly known as:  RELION INSULIN SYRINGE Use 3x a day   lisinopril 10 MG tablet Commonly known as:  PRINIVIL,ZESTRIL Take 1 tablet (10 mg total) by mouth 2 (two) times daily.   MAGNESIUM PO Take 1 tablet by mouth daily.   meclizine 12.5 MG tablet Commonly known as:  ANTIVERT Take 1 tablet (12.5 mg total) by mouth 3 (three) times daily as needed for dizziness.   meloxicam 7.5 MG tablet Commonly known as:  MOBIC Take 1 tablet (7.5 mg total) by mouth daily.   metFORMIN 500 MG 24 hr tablet Commonly known as:  GLUCOPHAGE-XR TAKE 1 TABLET(500 MG) BY MOUTH THREE TIMES DAILY   multivitamin with minerals Tabs tablet Take 1 tablet by mouth daily.   nitroGLYCERIN 0.4 MG SL tablet Commonly known as:  NITROSTAT Place 1 tablet (0.4 mg total) under the tongue every 5 (five) minutes as needed for chest pain.   omega-3 acid ethyl esters 1 g capsule Commonly known as:  LOVAZA Take 2 capsules (2 g total) by mouth 2 (two) times daily.   spironolactone 25 MG  tablet Commonly known as:  ALDACTONE TAKE 1/2 TABLET BY MOUTH EVERY DAY          Objective:   Physical Exam BP 134/78 (BP Location: Left Arm, Patient Position: Sitting, Cuff Size: Normal)   Pulse 66   Temp 97.8 F (36.6 C) (Oral)   Resp 14   Ht 6\' 2"  (1.88 m)   Wt 286 lb 8 oz (130 kg)   SpO2 98%   BMI 36.78 kg/m  General:   Well developed, well nourished . NAD.  HEENT:  Normocephalic . Face symmetric, atraumatic  MSK: Right knee normal Left  knee:  Slightly swollen without obvious anterior effusion. + Pain triggered by any range of motion which is a slightly limited Calves: Normal on the right, the left one is swollen, 1 inch greater in circumference. Slightly TTP proximally. Skin: Not pale. Not jaundice Neurologic:  alert & oriented X3.  Speech normal, gait limited by knee pain Psych--  Cognition and judgment appear intact.  Cooperative with normal attention span and concentration.  Behavior appropriate. No anxious or depressed appearing.      Assessment & Plan:   Assessment   DM -- insulin dependent, uncontrolled, with complications, CAD CHF. Dr Cruzita Lederer HTN Hyperlipidemia CAD, CHF, ischemic cardiomyopathy NSTEMI in 12/12, unstable angina in 4/14 and 5/14. He had DES to mid LAD in 12/12. EF was 40% with apical hypokinesis at the time of the MI. Repeat echo in 3/13 showed EF 55-60%. In 4/14, he was admitted with unstable angina and had 90% pLAD in-stent restenosis. This was treated with cutting balloon angioplasty. Readmitted, again with unstable angina, in 5/14. This time he had PTCA to 90% ostial D1 stenosis.  Anxiety-depression Skin cancer: Right leg, status post excision 2016; Dr Allyson Sabal  PLAN: Acute left knee pain:  Acute on chronic left knee pain, + swelling, suspect internal derangement. Calf is also swollen and slightly tender. Plan: Ultrasound rule out DVT, ortho referral, use a cane. Meloxicam  as needed only, knows is not a long-term med. GI  precautions discussed. Tylenol, ice

## 2016-09-30 NOTE — Patient Instructions (Signed)
Please get the ultrasound of the  left leg today  For pain: Ice packs twice a day  Meloxicam 1 tablet daily  as needed for pain.  Always take it with food because may cause gastritis and ulcers.  If you notice nausea, stomach pain, change in the color of stools --->  Stop the medicine and let us know  Is also perfectly fine to use Tylenol in addition to meloxicam  Tylenol  500 mg OTC 2 tabs a day every 8 hours as needed for pain

## 2016-09-30 NOTE — Progress Notes (Signed)
Pre visit review using our clinic review tool, if applicable. No additional management support is needed unless otherwise documented below in the visit note. 

## 2016-10-01 NOTE — Assessment & Plan Note (Signed)
Acute left knee pain:  Acute on chronic left knee pain, + swelling, suspect internal derangement. Calf is also swollen and slightly tender. Plan: Ultrasound rule out DVT, ortho referral, use a cane. Meloxicam  as needed only, knows is not a long-term med. GI precautions discussed. Tylenol, ice

## 2016-10-07 ENCOUNTER — Ambulatory Visit (INDEPENDENT_AMBULATORY_CARE_PROVIDER_SITE_OTHER): Payer: PPO | Admitting: Orthopaedic Surgery

## 2016-10-07 ENCOUNTER — Ambulatory Visit (INDEPENDENT_AMBULATORY_CARE_PROVIDER_SITE_OTHER): Payer: Self-pay | Admitting: Orthopaedic Surgery

## 2016-10-07 ENCOUNTER — Encounter (INDEPENDENT_AMBULATORY_CARE_PROVIDER_SITE_OTHER): Payer: Self-pay | Admitting: Orthopaedic Surgery

## 2016-10-07 ENCOUNTER — Ambulatory Visit (INDEPENDENT_AMBULATORY_CARE_PROVIDER_SITE_OTHER): Payer: Self-pay

## 2016-10-07 VITALS — BP 127/69 | HR 66 | Ht 74.0 in | Wt 288.0 lb

## 2016-10-07 DIAGNOSIS — M25562 Pain in left knee: Secondary | ICD-10-CM

## 2016-10-07 DIAGNOSIS — G8929 Other chronic pain: Secondary | ICD-10-CM | POA: Diagnosis not present

## 2016-10-07 MED ORDER — BUPIVACAINE HCL 0.5 % IJ SOLN
3.0000 mL | INTRAMUSCULAR | Status: AC | PRN
  Administered 2016-10-07: 3 mL via INTRA_ARTICULAR

## 2016-10-07 MED ORDER — METHYLPREDNISOLONE ACETATE 40 MG/ML IJ SUSP
80.0000 mg | INTRAMUSCULAR | Status: AC | PRN
  Administered 2016-10-07: 80 mg

## 2016-10-07 MED ORDER — LIDOCAINE HCL 1 % IJ SOLN
5.0000 mL | INTRAMUSCULAR | Status: AC | PRN
  Administered 2016-10-07: 5 mL

## 2016-10-07 NOTE — Progress Notes (Signed)
Office Visit Note   Patient: Randy Castro           Date of Birth: Jun 26, 1951           MRN: [id number] Visit Date: 10/07/2016              Requested by: Colon Branch, MD [address] 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 Counsel regarding possible increased blood sugar over the next several days. Return to office in the next month if not much improvement. Consider MRI scan.  Follow-Up Instructions: No Follow-up on file.   Orders:  No orders of the defined types were placed in this encounter.  No orders of the defined types were placed in this encounter.     Procedures: Large Joint Inj Date/Time: 10/07/2016 12:39 PM Performed by: Garald Balding Authorized by: Garald Balding   Consent Given by:  Patient Timeout: prior to procedure the correct patient, procedure, and site was verified   Indications:  Pain and joint swelling Location:  Knee Site:  L knee Prep: patient was prepped and draped in usual sterile fashion   Needle Size:  25 G Needle Length:  1.5 inches Approach:  Anteromedial Ultrasound Guidance: No   Fluoroscopic Guidance: No   Arthrogram: No   Medications:  5 mL lidocaine 1 %; 80 mg methylPREDNISolone acetate 40 MG/ML; 3 mL bupivacaine 0.5 % Aspiration Attempted: Yes   Aspirate amount (mL):  40 Aspirate:  Yellow Patient tolerance:  Patient tolerated the procedure well with no immediate complications     Clinical Data: No additional findings.   Subjective: No chief complaint on file.   Left knee pain no known injury  Mr. [name] is accompanied by his wife and relates that he's had trouble off and on with his left knee for "long time only recently he as he had pain to the point of compromise associated with "swelling". He denies any history of injury or trauma. He's  been using meloxicam up, applying ice" resting it". He relates that his knee is oftentimes swollen and sometimes will "give way".  Review of Systems   Objective: Vital Signs: There were no vitals taken for this visit.  Physical Exam  Ortho Exam left knee exam consistent with a positive effusion. Predominantly medial joint pain. Some patellar crepitation. Full extension and flexion over 105. No instability. No calf pain. Neurovascular exam intact. Straight leg raise negative. Painless range of motion of left hip.  Specialty Comments:  No specialty comments available.  Imaging: No results found.   PMFS History: Patient Active Problem List   Diagnosis Date Noted  . Obesity 01/23/2016  . PCP NOTES >>> 06/08/2015  . Anxiety and depression 05/31/2013  . Intermediate coronary syndrome ([place]) 12/22/2012  . Unstable angina ([place]) 12/16/2012  . Fatigue 04/26/2012  . Annual physical exam 10/28/2011  . Rash 10/28/2011  . Coronary Artery Disease 08/28/2011  . 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  . Palpitations 08/28/2011  . NSTEMI (non-ST elevated myocardial infarction) ([place]) 08/07/2011  . DJD (degenerative joint disease) 05/28/2011  . Poorly controlled type 2 diabetes mellitus with circulatory disorder ([place])   . Hyperlipidemia   . HTN (hypertension)    Past Medical History:  Diagnosis Date  . Arthritis    "knees probably" (12/22/2012)  . CAD (coronary artery disease)    a.  NSTEMI 12/12: EF 40-45%. [id number] balloon PTCA + Promus DES x 1 to mid LAD;  b. 11/2012: Cath with Cutting balloon PTCA  LAD for ISR to LAD, EF 55%;  c. 12/2012 Cath/PCI: LAD stents patent, 80 ost Diag (jailed)->PTCA, LCX/RCA patent.  . Exertional shortness of breath   . HTN (hypertension)   . Hyperlipidemia   . Ischemic cardiomyopathy    a.  echo 08/06/11: dist ant wall, apical and septal and infero-apical HK, mild LVH, EF 40%, mild LAE, PASP 34, asc aorta mildly dilated, mild TR;  b.  Echo (3/13) showed  recovery of LV systolic function with EF 0000000, grade I diastolic dysfunction, mild MR.   . Type II diabetes mellitus ([place]) dx in the 90s    Family History  Problem Relation Age of Onset  . Diabetes Brother   . Cancer Brother   . Diabetes Mother   . Coronary artery disease Father   . Heart attack Father 66  . Diabetes Sister   . Colon cancer Neg Hx   . Prostate cancer Neg Hx   . Stroke Neg Hx     Past Surgical History:  Procedure Laterality Date  . ANTERIOR CERVICAL DECOMP/DISCECTOMY FUSION  in the 90s  . CORONARY ANGIOPLASTY  12/15/2012; 12/22/2012  . CORONARY ANGIOPLASTY WITH STENT PLACEMENT  07/2011   "1" (12/22/2012)  . LEFT HEART CATHETERIZATION WITH CORONARY ANGIOGRAM N/A 08/06/2011   Procedure: LEFT HEART CATHETERIZATION WITH CORONARY ANGIOGRAM;  Surgeon: Burnell Blanks, MD;  Location: [facility] CATH LAB;  Service: Cardiovascular;  Laterality: N/A;  . LEFT HEART CATHETERIZATION WITH CORONARY ANGIOGRAM N/A 12/15/2012   Procedure: LEFT HEART CATHETERIZATION WITH CORONARY ANGIOGRAM;  Surgeon: Sherren Mocha, MD;  Location: [facility] CATH LAB;  Service: Cardiovascular;  Laterality: N/A;  . LEFT HEART CATHETERIZATION WITH CORONARY ANGIOGRAM N/A 12/22/2012   Procedure: LEFT HEART CATHETERIZATION WITH CORONARY ANGIOGRAM;  Surgeon: Sherren Mocha, MD;  [facility] LAB;  Service: Cardiovascular;  Laterality: N/A;  . PERCUTANEOUS CORONARY STENT INTERVENTION (PCI-S) Right 12/15/2012   Procedure: PERCUTANEOUS CORONARY STENT INTERVENTION (PCI-S);  Surgeon: Sherren Mocha, MD;  Location: [facility] CATH LAB;  Service: Cardiovascular;  Laterality: Right;   Social History   Occupational History  . retired--maintenance tech    Social History Main Topics  . Smoking status: Former Smoker    Packs/day: 1.00    Years: 25.00    Types: Cigarettes    Quit date: 04/12/1992  . Smokeless tobacco: Never Used  . Alcohol use Yes     Comment: 12/22/2012 "rarely maybe every 3-4 months, beer"  . Drug use: No  .  Sexual activity: Yes

## 2016-10-11 ENCOUNTER — Other Ambulatory Visit: Payer: Self-pay | Admitting: Internal Medicine

## 2016-10-12 ENCOUNTER — Encounter: Payer: Self-pay | Admitting: Internal Medicine

## 2016-10-13 ENCOUNTER — Other Ambulatory Visit: Payer: Self-pay | Admitting: Internal Medicine

## 2016-10-13 ENCOUNTER — Encounter: Payer: Self-pay | Admitting: Internal Medicine

## 2016-10-13 MED ORDER — INSULIN REGULAR HUMAN (CONC) 500 UNIT/ML ~~LOC~~ SOPN: PEN_INJECTOR | SUBCUTANEOUS | 2 refills | Status: DC

## 2016-11-13 ENCOUNTER — Encounter: Payer: Self-pay | Admitting: Internal Medicine

## 2016-11-13 ENCOUNTER — Ambulatory Visit (INDEPENDENT_AMBULATORY_CARE_PROVIDER_SITE_OTHER): Payer: PPO | Admitting: Internal Medicine

## 2016-11-13 VITALS — BP 134/78 | HR 80 | Wt 295.0 lb

## 2016-11-13 DIAGNOSIS — E6609 Other obesity due to excess calories: Secondary | ICD-10-CM | POA: Diagnosis not present

## 2016-11-13 DIAGNOSIS — E1159 Type 2 diabetes mellitus with other circulatory complications: Secondary | ICD-10-CM

## 2016-11-13 DIAGNOSIS — E1165 Type 2 diabetes mellitus with hyperglycemia: Secondary | ICD-10-CM | POA: Diagnosis not present

## 2016-11-13 DIAGNOSIS — IMO0001 Reserved for inherently not codable concepts without codable children: Secondary | ICD-10-CM

## 2016-11-13 DIAGNOSIS — Z6836 Body mass index (BMI) 36.0-36.9, adult: Secondary | ICD-10-CM | POA: Diagnosis not present

## 2016-11-13 MED ORDER — INSULIN REGULAR HUMAN (CONC) 500 UNIT/ML ~~LOC~~ SOPN: PEN_INJECTOR | SUBCUTANEOUS | 5 refills | Status: DC

## 2016-11-13 MED ORDER — METFORMIN HCL ER 500 MG PO TB24: 500.0000 mg | ORAL_TABLET | Freq: Three times a day (TID) | ORAL | 3 refills | Status: DC

## 2016-11-13 NOTE — Patient Instructions (Addendum)
Please restart U500 and increase: - 120 units before b'fast - 100 units before dinner  Continue: - Metformin 500 mg 3x a day  Please return in 1.5 months with your sugar log.

## 2016-11-13 NOTE — Progress Notes (Signed)
Subjective:     Patient ID: Randy Castro, male   DOB: March 27, 1951, 66 y.o.   MRN: [id number]  HPI Review of Systems Objective:   Physical Exam Assessment:   HPI Mr. Haydu is a pleasant 66 y.o. man returning for f/u for DM2, dx 1998, uncontrolled, insulin dependent, with complications (CAD, s/p MI 07/2011, s/p PTCA with DES, s/p PTCA for in-stent restenosis 12/15/12; also ED). He is here with his wife who offers part of the hx. Last visit was 2 mo ago.  He will need to have L knee replacement soon. He had a steroid inj 1 mo ago >> sugars higher. He is on Meloxicam.  Reviewed HbA1c levels:  Lab Results  Component Value Date   HGBA1C 9.8 09/18/2016   HGBA1C 9.4 05/25/2016   HGBA1C 8.8 01/23/2016   He was on a regimen of: - Basaglar 75 units at bedtime - not covered anymore - Metformin ER 500 mg 3x a day - Jardiance 25 mg in am  - not covered anymore - NovoLog: - very expensive 25 units with a smaller meal 35 units with a large meal  At last visit, we started U500 insulin: - 120 units before b'fast - 70 units before dinner Continues: - Metformin ER 500 mg 2x a day  We tried to start Bydureon 2 g weekly 05/2016 >> not covered We stopped Glipizide when we started mealtime insulin. We stopped Bydureon when we started mealtime insulin We stopped Januvia 100 in 12/2013. He was on JanuMet 50/1000 bid >> stopped 2/2 diarrhea, fatigue >> restarted but with same SEs >> stopped. He retired on 08/15/2013 >> less activity, increased weight. Sugars have greatly increased >> we added Invokana. We had to stop this and switch to [place] per insurance coverage. He could not take maximum dose of metformin XR due to diarrhea. He could not tolerate Trulicity due to nausea and vomiting. We had to stop Toujeo 2/2 insurance coverage.  He checks his sugars approx 1-3/day >> higher: - am: 159-241, 272, 287 >> 155-179 >> 166-260, 292 >> 113-221, 267 >> 129-323 >> 268-370 - after b'fast: 367 >> 158  >> n/c >> 231, 315 >> n/c  - before lunch: 184-268, 314 >> 123-223 >> 168-240 >> 129-213, 247 >> 197-283 >> 263-320 - 2h after lunch: 143 >> n/c - before dinner: 142-242 >> 142, 178, 253 >> n/c >> 111-119 >> 177, 233, 303 >> 280 - bedtime:  124, 131 >> 301 >> 178, 240 >> n/c >> 140, 205 >> n/c - nighttime: 3 lows 58-60s >> n/c >> 103 >> n/c No lows. He has hypoglycemia awareness at 66.  - No CKD. Last BUN/Cr:  Lab Results  Component Value Date   BUN 20 08/03/2016   CREATININE 1.05 08/03/2016  He is on lisinopril. - No neuropathy per foot exam 01/02/2016. - He has HL: Lab Results  Component Value Date   CHOL 143 05/18/2016   HDL 28 (L) 05/18/2016   LDLCALC 46 05/18/2016   LDLDIRECT 64.0 01/02/2016   TRIG 343 (H) 05/18/2016   CHOLHDL 5.1 (H) 05/18/2016  He is on Lipitor - no DR per last eye exam on 01/09/2016 >> no DR reportedly. He is on ASA 81.  PMH:   Pt had a period off the meds for cost reasons and was restarted on DM meds in 03/2011. In 07/2011, he had a NSTEMI and had a DES placed. He also has CHF, improved (EF increased from 40-45% in 07/2011 to 55-60% in  10/2011). Sees Dr. Aundra Dubin. He had CP/SOB >> saw Dr. Percival Spanish on 12/15/3012 >> heart catheterization >> PTCA for LAD instent restenosis.  I reviewed pt's medications, allergies, PMH, social hx, family hx, and changes were documented in the history of present illness. Otherwise, unchanged from my initial visit note.  Review of Systems Constitutional: no weight gain, no fatigue, no subjective hyperthermia/hypothermia Eyes: no blurry vision, no xerophthalmia ENT: no sore throat, no nodules palpated in throat, no dysphagia/odynophagia, no hoarseness Cardiovascular: no CP/no palpitations/leg swelling Respiratory: no cough/SOB Gastrointestinal: no N/V/D/C/heartburn Musculoskeletal: muscle/+ joint aches (L knee) Skin: no rashes Neurological: no tremors/numbness/tingling/dizziness  Objective:   Physical Exam BP 134/78 (BP  Location: Left Arm, Patient Position: Sitting)   Pulse 80   Wt 295 lb (133.8 kg)   SpO2 95%   BMI 37.88 kg/m  Body mass index is 37.88 kg/m.  Wt Readings from Last 3 Encounters:  11/13/16 295 lb (133.8 kg)  10/07/16 288 lb (130.6 kg)  09/30/16 286 lb 8 oz (130 kg)   Constitutional: obese, in NAD, antalgic gain - L knee pain Eyes: PERRLA, EOMI, no exophthalmos ENT: moist mucous membranes, no thyromegaly, no cervical lymphadenopathy Cardiovascular: RRR, No MRG, + periankle swelling L leg Respiratory: CTA B Gastrointestinal: abdomen soft, NT, ND, BS+ Musculoskeletal: no deformities, strength intact in all 4 Skin: moist, warm, no rashes Neurological: no tremor with outstretched hands, DTR normal in all 4 Assessment:     1. DM2, insulin dependent, uncontrolled, with complications: - CAD, s/p NSTEMI 07/2011, with PTCA + DES (Dr. Aundra Dubin), s/p PTCA for in-stent restenosis 12/15/2012- CHF, EF 55-60% per 2D ECHO in 10/2011 - ED - no DR - no nephropathy  2. Obesity    Plan:     Pt with long standing uncontrolled DM despite an intensive treatment regimen. We tried to add Bydureon and he was also on Jardiance, but he could not afford these now. We had to switch from Basaglar and NovoLog to an N and R insulins. However, as sugars were still high despite a high TDD of insulin >> we started U500 insulin. Sugars are higher as we backed of the TDD to avoid lows during the switch. We need to increase evening dose now as he starts the day with hyperglycemia and remains high throughout the day. -  last HbA1c was very high, at 9.8% - I advised him to: Patient Instructions  Please restart U500 and increase: - 120 units before b'fast - 100 units before dinner  Continue: - Metformin 500 mg 3x a day  Please return in 1.5 months with your sugar log.   - UTD with eye exams - UTD with flu shot - return in 1.5 months with his sugar log.   2. Obesity - continues to gain weight - Started to  control his diet more and goes to the gym  Philemon Kingdom, MD PhD [facility] Endocrinology

## 2016-11-16 ENCOUNTER — Other Ambulatory Visit: Payer: Self-pay

## 2016-11-16 MED ORDER — INSULIN REGULAR HUMAN (CONC) 500 UNIT/ML ~~LOC~~ SOPN: PEN_INJECTOR | SUBCUTANEOUS | 5 refills | Status: DC

## 2016-11-17 ENCOUNTER — Other Ambulatory Visit: Payer: Self-pay

## 2016-11-17 ENCOUNTER — Encounter (INDEPENDENT_AMBULATORY_CARE_PROVIDER_SITE_OTHER): Payer: Self-pay | Admitting: Orthopaedic Surgery

## 2016-11-17 ENCOUNTER — Ambulatory Visit (INDEPENDENT_AMBULATORY_CARE_PROVIDER_SITE_OTHER): Payer: PPO | Admitting: Orthopaedic Surgery

## 2016-11-17 VITALS — BP 143/69 | HR 78 | Temp 99.3°F | Resp 16 | Ht 73.0 in | Wt 280.0 lb

## 2016-11-17 DIAGNOSIS — M25562 Pain in left knee: Secondary | ICD-10-CM | POA: Diagnosis not present

## 2016-11-17 DIAGNOSIS — G8929 Other chronic pain: Secondary | ICD-10-CM | POA: Diagnosis not present

## 2016-11-17 DIAGNOSIS — M1712 Unilateral primary osteoarthritis, left knee: Secondary | ICD-10-CM | POA: Diagnosis not present

## 2016-11-17 MED ORDER — LIDOCAINE HCL 1 % IJ SOLN
3.0000 mL | INTRAMUSCULAR | Status: AC | PRN
  Administered 2016-11-17: 3 mL

## 2016-11-17 MED ORDER — BUPIVACAINE HCL 0.5 % IJ SOLN
3.0000 mL | INTRAMUSCULAR | Status: AC | PRN
  Administered 2016-11-17: 3 mL via INTRA_ARTICULAR

## 2016-11-17 NOTE — Progress Notes (Signed)
Office Visit Note   Patient: Randy Castro           Date of Birth: Dec 20, 1950           MRN: [id number] Visit Date: 11/17/2016              Requested by: Colon Branch, MD [address] [place], [place] [address] PCP: Kathlene November, MD   Assessment & Plan: Visit Diagnoses:  1. Chronic pain of left knee   2. Unilateral primary osteoarthritis, left knee     Plan:  #1: Aspiration of the left knee with fluid sent for evaluation . #2: Injected with Marcaine and Xylocaine #3: Schedule [facility] at [facility] #4: Obtain precertification for Visco supplementation   Follow-Up Instructions: Return in about 2 weeks (around 12/01/2016), or [place].   Orders:  Orders Placed This Encounter  Procedures  . MR Knee Left w/o contrast   No orders of the defined types were placed in this encounter.     Procedures: Large Joint Inj Date/Time: 11/17/2016 2:24 PM Performed by: Biagio Borg D Authorized by: Biagio Borg D   Consent Given by:  Patient Timeout: prior to procedure the correct patient, procedure, and site was verified   Indications:  Pain and joint swelling Location:  Knee Site:  L knee Prep: patient was prepped and draped in usual sterile fashion   Needle Size:  18 G Needle Length:  1.5 inches Approach:  Anteromedial Ultrasound Guidance: No   Fluoroscopic Guidance: No   Arthrogram: No   Medications:  3 mL lidocaine 1 %; 3 mL bupivacaine 0.5 % Aspiration Attempted: Yes   Aspirate amount (mL):  25 Aspirate:  Yellow and blood-tinged Lab: fluid sent for laboratory analysis   Patient tolerance:  Patient tolerated the procedure well with no immediate complications     Clinical Data: No additional findings.   Subjective: Chief Complaint  Patient presents with  . Left Knee - Pain, Edema    [name] is  a 66 year old male that presents with OA left knee exam consistent with a positive effusion.Predominantly medial joint pain with some patellar crepitation.  No  instability. No calf pain. Neurovascular exam intact. Straight leg raise negative. Painless range of motion of left hip.  Aspiration left knee with injection of cortisone on 10/07/16. Pt relates the injection lasted 2 weeks, pain returned and is now 9/10. The lower left leg has redness, edema and some pain.   Pt also states the cortisone injection elevated his blood sugar to 400 as he is diabetic.    Review of Systems  Endocrine:       History of type 2 diabetes mellitus  All other systems reviewed and are negative.    Objective: Vital Signs: BP (!) 143/69   Pulse 78   Temp 99.3 F (37.4 C)   Resp 16   Ht 6\' 1"  (1.854 m)   Wt 280 lb (127 kg)   BMI 36.94 kg/m   Physical Exam  Musculoskeletal:       Left knee: He exhibits effusion (1+).    Left Knee Exam   Tenderness  The patient is experiencing tenderness in the medial joint line.  Range of Motion  Extension: -5  Flexion: 100   Other  Erythema: absent Sensation: normal Effusion: effusion (1+) present      Specialty Comments:  No specialty comments available.  Imaging: No results found.   PMFS History: Patient Active Problem List  Diagnosis Date Noted  . Obesity 01/23/2016  . PCP NOTES >>> 06/08/2015  . Anxiety and depression 05/31/2013  . Intermediate coronary syndrome ([place]) 12/22/2012  . Unstable angina ([place]) 12/16/2012  . Fatigue 04/26/2012  . Annual physical exam 10/28/2011  . Rash 10/28/2011  . Coronary Artery Disease 08/28/2011  . 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  . Palpitations 08/28/2011  . NSTEMI (non-ST elevated myocardial infarction) ([place]) 08/07/2011  . DJD (degenerative joint disease) 05/28/2011  . Poorly controlled type 2 diabetes mellitus with circulatory disorder ([place])   . Hyperlipidemia   . HTN (hypertension)    Past Medical History:  Diagnosis Date  . Arthritis    "knees probably" (12/22/2012)  . CAD (coronary artery disease)    a. NSTEMI 12/12: EF 40-45%. [id number]  balloon PTCA + Promus DES x 1 to mid LAD;  b. 11/2012: Cath with Cut[place] balloon PTCA  LAD for ISR to LAD, EF 55%;  c. 12/2012 Cath/PCI: LAD stents patent, 80 ost Diag (jailed)->PTCA, LCX/RCA patent.  . Exertional shortness of breath   . HTN (hypertension)   . Hyperlipidemia   . Ischemic cardiomyopathy    a.  echo 08/06/11: dist ant wall, apical and septal and infero-apical HK, mild LVH, EF 40%, mild LAE, PASP 34, asc aorta mildly dilated, mild TR;  b.  Echo (3/13) showed recovery of LV systolic function with EF 23-76%, grade I diastolic dysfunction, mild MR.   . Type II diabetes mellitus ([place]) dx in the 90s    Family History  Problem Relation Age of Onset  . Diabetes Brother   . Cancer Brother   . Diabetes Mother   . Coronary artery disease Father   . Heart attack Father 71  . Diabetes Sister   . Colon cancer Neg Hx   . Prostate cancer Neg Hx   . Stroke Neg Hx     Past Surgical History:  Procedure Laterality Date  . ANTERIOR CERVICAL DECOMP/DISCECTOMY FUSION  in the 90s  . CORONARY ANGIOPLASTY  12/15/2012; 12/22/2012  . CORONARY ANGIOPLASTY WITH STENT PLACEMENT  07/2011   "1" (12/22/2012)  . LEFT HEART CATHETERIZATION WITH CORONARY ANGIOGRAM N/A 08/06/2011   Procedure: LEFT HEART CATHETERIZATION WITH CORONARY ANGIOGRAM;  Surgeon: Burnell Blanks, MD;  Location: [facility] CATH LAB;  Service: Cardiovascular;  Laterality: N/A;  . LEFT HEART CATHETERIZATION WITH CORONARY ANGIOGRAM N/A 12/15/2012   Procedure: LEFT HEART CATHETERIZATION WITH CORONARY ANGIOGRAM;  Surgeon: Sherren Mocha, MD;  Location: [facility] CATH LAB;  Service: Cardiovascular;  Laterality: N/A;  . LEFT HEART CATHETERIZATION WITH CORONARY ANGIOGRAM N/A 12/22/2012   Procedure: LEFT HEART CATHETERIZATION WITH CORONARY ANGIOGRAM;  Surgeon: Sherren Mocha, MD;  Location: [facility] CATH LAB;  Service: Cardiovascular;  Laterality: N/A;  . PERCUTANEOUS CORONARY STENT INTERVENTION (PCI-S) Right 12/15/2012   Procedure: PERCUTANEOUS CORONARY STENT  INTERVENTION (PCI-S);  Surgeon: Sherren Mocha, MD;  Location: [facility] CATH LAB;  Service: Cardiovascular;  Laterality: Right;   Social History   Occupational History  . retired--maintenance tech    Social History Main Topics  . Smoking status: Former Smoker    Packs/day: 1.00    Years: 25.00    Types: Cigarettes    Quit date: 04/12/1992  . Smokeless tobacco: Never Used  . Alcohol use Yes     Comment: 12/22/2012 "rarely maybe every 3-4 months, beer"  . Drug use: No  . Sexual activity: Yes

## 2016-11-18 LAB — SYNOVIAL CELL COUNT + DIFF, W/ CRYSTALS
BASOPHILS, %: 0 %
Eosinophils-Synovial: 0 % (ref 0–2)
LYMPHOCYTES-SYNOVIAL FLD: 39 % (ref 0–74)
MONOCYTE/MACROPHAGE: 46 % (ref 0–69)
Neutrophil, Synovial: 15 % (ref 0–24)
Synoviocytes, %: 0 % (ref 0–15)
WBC, Synovial: 180 cells/uL — ABNORMAL HIGH (ref <150)

## 2016-11-19 ENCOUNTER — Encounter (INDEPENDENT_AMBULATORY_CARE_PROVIDER_SITE_OTHER): Payer: Self-pay | Admitting: Orthopaedic Surgery

## 2016-11-19 MED FILL — HUMULIN R 500 UNITS/ML KWIK: 500 | 27 days supply | Qty: 12 | Fill #0

## 2016-11-19 NOTE — Telephone Encounter (Signed)
Patient is having trouble getting his insulin filled  insulin regular human CONCENTRATED (HUMULIN R U-500 KWIKPEN) 500 UNIT/ML kwikpen  Please advise on what to do.

## 2016-11-22 LAB — BODY FLUID CULTURE
Gram Stain: NONE SEEN
Gram Stain: NONE SEEN
Organism ID, Bacteria: NO GROWTH

## 2016-12-04 ENCOUNTER — Encounter: Payer: Self-pay | Admitting: Internal Medicine

## 2016-12-04 ENCOUNTER — Encounter (INDEPENDENT_AMBULATORY_CARE_PROVIDER_SITE_OTHER): Payer: Self-pay | Admitting: Orthopaedic Surgery

## 2016-12-10 ENCOUNTER — Ambulatory Visit (HOSPITAL_COMMUNITY)
Admission: RE | Admit: 2016-12-10 | Discharge: 2016-12-10 | Disposition: A | Discharge status: Home / Self Care | Payer: PPO | Source: Ambulatory Visit | Attending: Orthopaedic Surgery | Admitting: Orthopaedic Surgery

## 2016-12-10 DIAGNOSIS — M65862 Other synovitis and tenosynovitis, left lower leg: Secondary | ICD-10-CM | POA: Diagnosis not present

## 2016-12-10 DIAGNOSIS — M25462 Effusion, left knee: Secondary | ICD-10-CM | POA: Insufficient documentation

## 2016-12-10 DIAGNOSIS — G8929 Other chronic pain: Secondary | ICD-10-CM | POA: Diagnosis not present

## 2016-12-10 DIAGNOSIS — M1712 Unilateral primary osteoarthritis, left knee: Secondary | ICD-10-CM | POA: Insufficient documentation

## 2016-12-10 DIAGNOSIS — M25562 Pain in left knee: Secondary | ICD-10-CM | POA: Insufficient documentation

## 2016-12-10 DIAGNOSIS — S83232A Complex tear of medial meniscus, current injury, left knee, initial encounter: Secondary | ICD-10-CM | POA: Insufficient documentation

## 2016-12-14 ENCOUNTER — Ambulatory Visit (INDEPENDENT_AMBULATORY_CARE_PROVIDER_SITE_OTHER): Payer: PPO | Admitting: Orthopaedic Surgery

## 2016-12-16 ENCOUNTER — Ambulatory Visit (INDEPENDENT_AMBULATORY_CARE_PROVIDER_SITE_OTHER): Payer: PPO | Admitting: Orthopaedic Surgery

## 2016-12-16 ENCOUNTER — Encounter: Payer: Self-pay | Admitting: Internal Medicine

## 2016-12-16 DIAGNOSIS — G8929 Other chronic pain: Secondary | ICD-10-CM

## 2016-12-16 DIAGNOSIS — M25562 Pain in left knee: Secondary | ICD-10-CM

## 2016-12-16 NOTE — Progress Notes (Signed)
Office Visit Note   Patient: Randy Castro           Date of Birth: April 18, 1951           MRN: [id number] Visit Date: 12/16/2016              Requested by: Colon Branch, MD [address] [place], [place] [address] PCP: Kathlene November, MD   Assessment & Plan: Visit Diagnoses:  1. Chronic pain of left knee   MRI scan demonstrates a large radial tear of the posterior horn of the medial meniscus. Her are mild to moderate degenerative changes in all 3 compartments associated with a large effusion and a Baker's cyst  Plan: Long discussion regarding MRI scan and plain film findings. 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 He's been having pain for approximately 6 weeks afterward twisting injury. I certainly believe that the meniscus is causing him to have some pain. Obviously some of the arthritis is causing some of the discomfort and recurrent effusion. Based on his history and his pain I think the next best step would be a knee arthroscopy. I discussed this in detail with both he and his wife. He is fully aware that this may or may not resolve all of his pain because of the arthritis and inability to resolve that with the arthroscope. He would  like to proceed  Follow-Up Instructions: Return will schedule surgery left knee.   Orders:  No orders of the defined types were placed in this encounter.  No orders of the defined types were placed in this encounter.     Procedures: No procedures performed   Clinical Data: No additional findings.   Subjective: No chief complaint on file.  Randy Castro continues to have significant pain in his left knee. On occasion he has sensation of his knee giving way because of the "pain". Accordingly I ordered an MRI scan as he did not have a good result with the intra-articular cortisone injection thinking that the majority of his pain  may be related to the arthritis. The MRI scan report is on the chart and demonstrates a large radial tear of the posterior horn of the medial meniscus associated with degenerative changes in all 3 compartments. He is having some anterior pain probable early from from the patella as well as posterior medial joint pain which I believe is related to the tear of the medial meniscus  HPI  Review of Systems   Objective: Vital Signs: There were no vitals taken for this visit.  Physical Exam  Ortho Exam left knee exam with a positive effusion. Some fullness in the popliteal space consistent with the MRI scan findings of a Baker's cyst. Full knee extension and approximately 102 with 3 of flexion without instability. No calf pain. Neurovascular exam intact. Nonpainful varicose veins are present  Specialty Comments:  No specialty comments available.  Imaging: No results found.   PMFS History: Patient Active Problem List   Diagnosis Date Noted  . Obesity 01/23/2016  . PCP NOTES >>> 06/08/2015  . Anxiety and depression 05/31/2013  . Intermediate coronary syndrome ([place]) 12/22/2012  . Unstable angina ([place]) 12/16/2012  . Fatigue 04/26/2012  . Annual physical exam 10/28/2011  . Rash 10/28/2011  . Coronary Artery Disease 08/28/2011  . 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  . Palpitations 08/28/2011  . NSTEMI (non-ST elevated  myocardial infarction) ([place]) 08/07/2011  . DJD (degenerative joint disease) 05/28/2011  . Poorly controlled type 2 diabetes mellitus with circulatory disorder ([place])   . Hyperlipidemia   . HTN (hypertension)    Past Medical History:  Diagnosis Date  . Arthritis    "knees probably" (12/22/2012)  . CAD (coronary artery disease)    a. NSTEMI 12/12: EF 40-45%. BZJ:[id number] balloon PTCA + Promus DES x 1 to mid LAD;  b. 11/2012: Cath with Cutting balloon PTCA  LAD for ISR to LAD, EF 55%;  c. 12/2012 Cath/PCI: LAD stents patent, 80 ost Diag (jailed)->PTCA, LCX/RCA patent.  .  Exertional shortness of breath   . HTN (hypertension)   . Hyperlipidemia   . Ischemic cardiomyopathy    a.  echo 08/06/11: dist ant wall, apical and septal and infero-apical HK, mild LVH, EF 40%, mild LAE, PASP 34, asc aorta mildly dilated, mild TR;  b.  Echo (3/13) showed recovery of LV systolic function with EF 81-01%, grade I diastolic dysfunction, mild MR.   . Type II diabetes mellitus ([place]) dx in the 90s    Family History  Problem Relation Age of Onset  . Diabetes Brother   . Cancer Brother   . Diabetes Mother   . Coronary artery disease Father   . Heart attack Father 46  . Diabetes Sister   . Colon cancer Neg Hx   . Prostate cancer Neg Hx   . Stroke Neg Hx     Past Surgical History:  Procedure Laterality Date  . ANTERIOR CERVICAL DECOMP/DISCECTOMY FUSION  in the 90s  . CORONARY ANGIOPLASTY  12/15/2012; 12/22/2012  . CORONARY ANGIOPLASTY WITH STENT PLACEMENT  07/2011   "1" (12/22/2012)  . LEFT HEART CATHETERIZATION WITH CORONARY ANGIOGRAM N/A 08/06/2011   Procedure: LEFT HEART CATHETERIZATION WITH CORONARY ANGIOGRAM;  Surgeon: Burnell Blanks, MD;  Location: [facility] CATH LAB;  Service: Cardiovascular;  Laterality: N/A;  . LEFT HEART CATHETERIZATION WITH CORONARY ANGIOGRAM N/A 12/15/2012   Procedure: LEFT HEART CATHETERIZATION WITH CORONARY ANGIOGRAM;  Surgeon: Sherren Mocha, MD;  Location: [facility] CATH LAB;  Service: Cardiovascular;  Laterality: N/A;  . LEFT HEART CATHETERIZATION WITH CORONARY ANGIOGRAM N/A 12/22/2012   Procedure: LEFT HEART CATHETERIZATION WITH CORONARY ANGIOGRAM;  Surgeon: Sherren Mocha, MD;  Location: [facility] CATH LAB;  Service: Cardiovascular;  Laterality: N/A;  . PERCUTANEOUS CORONARY STENT INTERVENTION (PCI-S) Right 12/15/2012   Procedure: PERCUTANEOUS CORONARY STENT INTERVENTION (PCI-S);  Surgeon: Sherren Mocha, MD;  Location: [facility] CATH LAB;  Service: Cardiovascular;  Laterality: Right;   Social History   Occupational History  . retired--maintenance tech    Social  History Main Topics  . Smoking status: Former Smoker    Packs/day: 1.00    Years: 25.00    Types: Cigarettes    Quit date: 04/12/1992  . Smokeless tobacco: Never Used  . Alcohol use Yes     Comment: 12/22/2012 "rarely maybe every 3-4 months, beer"  . Drug use: No  . Sexual activity: Yes     Garald Balding, MD   Note - This record has been created using [employer].  Chart creation errors have been sought, but may not always  have been located. Such creation errors do not reflect on  the standard of medical care.

## 2016-12-17 ENCOUNTER — Telehealth (INDEPENDENT_AMBULATORY_CARE_PROVIDER_SITE_OTHER): Payer: Self-pay | Admitting: Orthopaedic Surgery

## 2016-12-17 ENCOUNTER — Telehealth: Payer: Self-pay

## 2016-12-17 NOTE — Telephone Encounter (Signed)
Hold Plavix 5 days prior to surgery.

## 2016-12-17 NOTE — Telephone Encounter (Signed)
-----   Message from Bea Graff, NT sent at 12/17/2016  1:20 PM EDT ----- Regarding: WHEN CAN PT STOP PLAVIX Sable Feil,    This pt is scheduled for surgery next Thursday May 3rd. He is needing to know when he shoulde stop his plavix before surgery? 5 days before? Or 7? Thanks so much!

## 2016-12-17 NOTE — Telephone Encounter (Signed)
[facility] patient, will route to Dr. Aundra Dubin

## 2016-12-17 NOTE — Telephone Encounter (Signed)
Randy Castro from [facility] stated she spoke with pt and he said he has not received any instruction on [place] and the surgical center also needs written instructions. Please advise pt.

## 2016-12-17 NOTE — Telephone Encounter (Signed)
Please call prescribing MD and ask when it is safe to stop pre op-thanks

## 2016-12-18 ENCOUNTER — Other Ambulatory Visit: Payer: Self-pay | Admitting: Internal Medicine

## 2016-12-18 ENCOUNTER — Encounter: Payer: Self-pay | Admitting: Internal Medicine

## 2016-12-18 ENCOUNTER — Telehealth (INDEPENDENT_AMBULATORY_CARE_PROVIDER_SITE_OTHER): Payer: Self-pay | Admitting: Orthopaedic Surgery

## 2016-12-18 MED ORDER — NOVOLIN N RELION 100 UNIT/ML ~~LOC~~ SUSP: SUBCUTANEOUS | 11 refills | Status: DC

## 2016-12-18 MED ORDER — NOVOLIN R RELION 100 UNIT/ML IJ SOLN: 30.0000 [IU] | Freq: Three times a day (TID) | INTRAMUSCULAR | 11 refills | Status: DC

## 2016-12-18 MED ORDER — "INSULIN SYRINGE-NEEDLE U-100 31G X 5/16"" 0.5 ML MISC": 5 refills | Status: DC

## 2016-12-18 NOTE — Telephone Encounter (Signed)
Called pt and advised him to stop Plavix 5 days prior to surgery.

## 2016-12-18 NOTE — Telephone Encounter (Signed)
Will forwadr to [employer]

## 2016-12-24 ENCOUNTER — Other Ambulatory Visit: Payer: Self-pay

## 2016-12-24 NOTE — Patient Outreach (Signed)
[facility]) Care Management  [place]   12/24/2016  Randy Castro Apr 17, 1951 [id number]  Subjective: Patient is a 66 year old male referred for assistance with affording Humulin U-500.  He has had trouble affording his U-500 insulin due to being in the donut hole.  A 90 day supply would cost him $1200.  A 27 day supply will cost him $250.  He is unable to afford either of these co-pays.  Due to his high risk of hospitalization without insulin and due to the amount of insulin he is on, he qualifies for the Emergency Pharmacy Fund.  Other medications are affordable per wife.   Medication reconciliation was performed today over the phone with the help of his wife. She manages his medications. She knows all of his medications and was able to verbalize which medications he was no longer prescribed. She stated that he was no longer taking meclizine, meloxicam, Novolin N, or Novolin R. She states that he is not experiencing any adverse effects and misses 1-2 doses every couple of weeks if he forgets, but typically takes them as soon as he remembers. She denies any questions or concerns at this time.    He takes a potassium supplement daily for leg cramps but has been doing so for the last 2-3 years. His last CMET in December 2017 revealed a K+ of 4.4.  Objective:   Encounter Medications: Outpatient Encounter Prescriptions as of 12/24/2016  Medication Sig Note  . acetaminophen (TYLENOL) 500 MG tablet Take 500 mg by mouth every 6 (six) hours as needed.   Marland Kitchen aspirin EC 81 MG tablet Take 81 mg by mouth daily.   Marland Kitchen atorvastatin (LIPITOR) 80 MG tablet Take 1 tablet (80 mg total) by mouth daily.   . BD PEN NEEDLE NANO U/F 32G X 4 MM MISC USE FOR INJECTIONS FOUR TIMES DAILY   . carvedilol (COREG) 12.5 MG tablet Take 12.5 mg by mouth daily.   . citalopram (CELEXA) 20 MG tablet Take 1 tablet (20 mg total) by mouth daily.   . clopidogrel (PLAVIX) 75 MG tablet TAKE 1 TABLET BY MOUTH EVERY  DAY   . clotrimazole-betamethasone (LOTRISONE) cream Apply topically 2 (two) times daily. (Patient taking differently: Apply topically 2 (two) times daily as needed. )   . famotidine (PEPCID) 20 MG tablet Take 20 mg by mouth 2 (two) times daily as needed for heartburn.   . fenofibrate (TRICOR) 145 MG tablet TAKE 1 TABLET BY MOUTH EVERY DAY   . glucose blood test strip Use 4x a day   . insulin regular human CONCENTRATED (HUMULIN R U-500 KWIKPEN) 500 UNIT/ML kwikpen Inject under skin 120 units before breakfast and 100 units before dinner   . Insulin Syringe-Needle U-100 (BD INSULIN SYRINGE ULTRAFINE) 31G X 5/16" 0.5 ML MISC Use 3x a day   . lisinopril (PRINIVIL,ZESTRIL) 10 MG tablet Take 1 tablet (10 mg total) by mouth 2 (two) times daily.   . Magnesium 250 MG TABS Take 1 tablet by mouth.   . metFORMIN (GLUCOPHAGE-XR) 500 MG 24 hr tablet Take 1 tablet (500 mg total) by mouth 3 (three) times daily with meals.   . Multiple Vitamin (MULTIVITAMIN WITH MINERALS) TABS Take 1 tablet by mouth daily.   . nitroGLYCERIN (NITROSTAT) 0.4 MG SL tablet Place 1 tablet (0.4 mg total) under the tongue every 5 (five) minutes as needed for chest pain.   Marland Kitchen omega-3 acid ethyl esters (LOVAZA) 1 g capsule Take 2 capsules (2 g  total) by mouth 2 (two) times daily.   . Potassium 99 MG TABS Take 1 tablet by mouth daily.   Marland Kitchen spironolactone (ALDACTONE) 25 MG tablet TAKE 1/2 TABLET BY MOUTH EVERY DAY   . [DISCONTINUED] carvedilol (COREG) 12.5 MG tablet TAKE 1 TABLET BY MOUTH TWICE DAILY   . NOVOLIN N RELION 100 UNIT/ML injection Injected under skin 80 units in a.m. and 60 units before dinner (Patient not taking: Reported on 12/24/2016) 12/24/2016: Patient uses only when he is unable to afford Humulin U-500.    Marland Kitchen NOVOLIN R RELION 100 UNIT/ML injection Inject 0.3-0.5 mLs (30-50 Units total) into the skin 3 (three) times daily before meals. (Patient not taking: Reported on 12/24/2016) 12/24/2016: Patient uses only when he is unable to afford  Humulin U-500  . [DISCONTINUED] acetaminophen (TYLENOL) 325 MG tablet Take 650 mg by mouth every 6 (six) hours as needed for pain.   . [DISCONTINUED] MAGNESIUM PO Take 1 tablet by mouth daily.   . [DISCONTINUED] meclizine (ANTIVERT) 12.5 MG tablet Take 1 tablet (12.5 mg total) by mouth 3 (three) times daily as needed for dizziness. (Patient not taking: Reported on 11/17/2016)   . [DISCONTINUED] meloxicam (MOBIC) 7.5 MG tablet Take 1 tablet (7.5 mg total) by mouth daily. (Patient not taking: Reported on 12/16/2016)    No facility-administered encounter medications on file as of 12/24/2016.     Functional Status: In your present state of health, do you have any difficulty performing the following activities: 09/03/2016 01/23/2016  Hearing? N N  Vision? N N  Difficulty concentrating or making decisions? N N  Walking or climbing stairs? N N  Dressing or bathing? N N  Doing errands, shopping? N N  Some recent data might be hidden    Fall/Depression Screening: Fall Risk  09/03/2016 01/23/2016 01/02/2016  Falls in the past year? No No No   PHQ 2/9 Scores 09/03/2016 01/23/2016 01/02/2016  PHQ - 2 Score 0 0 0  Exception Documentation - Patient refusal -   Assessment:  Drugs sorted by system:  Neurologic/Psychologic: Citalopram  Cardiovascular: Aspirin, atorvastatin, carvedilol, clopidogrel, fenofibrate, fish oil, lisinopril, nitroglycerin, spironolactone   Pulmonary/Allergy: none  Gastrointestinal: Famotidine  Endocrine: Humulin R U-500 Kwikpen, metformin XR   Renal: none  Topical: Lotrisone Cr   Pain: Acetaminophen  Vitamins/Minerals: Potassium, magnesium, multivitamin  Infectious Diseases: none  Miscellaneous: none   Duplications in therapy:  None  Gaps in therapy:  none  Medications to avoid in the elderly:  none  Drug interactions: Using lisinopril, spironolactone, and K+ supplements together may increase risk of hyperkalemia.  His potassium level is normal.    Other issues  noted: Affordability: unable to afford Humulin U-100 due to the large co-pays.  He qualifies for the Emergency Pharmacy Fund.    Plan: 1.  I was able to purchase a 27 day supply of Humulin U-500 using Emergency Pharmacy Fund.  I was able to deliver the medication to his house.   I will determine if he qualifies for the [employer].  He will have to spend $1100 out of pocket on prescriptions to qualify.  I will continue to assist with the Emergency Pharmacy Fund until he reaches this amount.    2.  Medication reconciliation complete.  No major issues or drug interactions noted.  Randy Castro did not state he was having any side effects from any of the medications.    3.  I had him complete the paperwork for [employer] to begin the process  of patient assistance.  They do not qualify for low income subsidy.  I did ask them to complete a budget to justify their expenses due to their income being over the allotted amount.    4.  I will follow up with them in 3 weeks to purchase another 27 days worth of Humulin U-500 and complete the paperwork.    5.  I will follow up Dr. Cruzita Lederer on my assistance as well.    Deanne Coffer, PharmD, [employer] [phone]

## 2016-12-26 ENCOUNTER — Other Ambulatory Visit: Payer: Self-pay | Admitting: Cardiology

## 2016-12-26 DIAGNOSIS — I10 Essential (primary) hypertension: Secondary | ICD-10-CM

## 2016-12-26 DIAGNOSIS — I2510 Atherosclerotic heart disease of native coronary artery without angina pectoris: Secondary | ICD-10-CM

## 2016-12-26 DIAGNOSIS — E785 Hyperlipidemia, unspecified: Secondary | ICD-10-CM

## 2016-12-30 ENCOUNTER — Inpatient Hospital Stay (INDEPENDENT_AMBULATORY_CARE_PROVIDER_SITE_OTHER): Payer: PPO | Admitting: Orthopaedic Surgery

## 2016-12-31 ENCOUNTER — Encounter: Payer: Self-pay | Admitting: Orthopaedic Surgery

## 2016-12-31 DIAGNOSIS — M1711 Unilateral primary osteoarthritis, right knee: Secondary | ICD-10-CM | POA: Diagnosis not present

## 2016-12-31 DIAGNOSIS — G8918 Other acute postprocedural pain: Secondary | ICD-10-CM | POA: Diagnosis not present

## 2016-12-31 DIAGNOSIS — M2342 Loose body in knee, left knee: Secondary | ICD-10-CM | POA: Diagnosis not present

## 2016-12-31 DIAGNOSIS — M23222 Derangement of posterior horn of medial meniscus due to old tear or injury, left knee: Secondary | ICD-10-CM | POA: Diagnosis not present

## 2016-12-31 HISTORY — PX: KNEE ARTHROSCOPY: SUR90

## 2017-01-05 ENCOUNTER — Encounter: Payer: PPO | Admitting: Internal Medicine

## 2017-01-06 ENCOUNTER — Ambulatory Visit (INDEPENDENT_AMBULATORY_CARE_PROVIDER_SITE_OTHER): Payer: PPO | Admitting: Orthopaedic Surgery

## 2017-01-06 DIAGNOSIS — G8929 Other chronic pain: Secondary | ICD-10-CM

## 2017-01-06 DIAGNOSIS — M25562 Pain in left knee: Secondary | ICD-10-CM

## 2017-01-06 NOTE — Progress Notes (Signed)
Office Visit Note   Patient: Randy Castro           Date of Birth: 11-Oct-1950           MRN: [id number] Visit Date: 01/06/2017              Requested by: Colon Branch, [address] [place], [place] [address] PCP: Colon Branch, MD   Assessment & Plan: Visit Diagnoses:  1. Chronic pain of left knee   6 days status post left knee arthroscopy with a partial medial meniscectomy. There also tricompartmental degenerative changes predominantly in the medial compartment  Plan: Return to office in 2 weeks. Activities as tolerated. Home exercise program.  Follow-Up Instructions: Return in about 2 weeks (around 01/20/2017).   Orders:  No orders of the defined types were placed in this encounter.  No orders of the defined types were placed in this encounter.     Procedures: No procedures performed   Clinical Data: No additional findings.   Subjective: No chief complaint on file. Randy Castro relates she's doing very well now 6 days status post left knee arthroscopy. He did have a partial medial meniscectomy. He mentioned that his knee is "much better" than it was preoperatively. He denies fever chills or calf pain. He is using a cane as an ambulatory aid. No shortness of breath or chest pain HPI  Review of Systems   Objective: Vital Signs: There were no vitals taken for this visit.  Physical Exam  Ortho Exam left knee exam with well-healed arthroscopic portals. Minimal effusion. Full extension about 100 of flexion. No instability. Minimal discomfort. No calf pain or distal edema. Neurovascular exam intact.  Specialty Comments:  No specialty comments available.  Imaging: No results found.   PMFS History: Patient Active Problem List   Diagnosis Date Noted  . Obesity 01/23/2016  . PCP NOTES >>> 06/08/2015  . Anxiety and depression 05/31/2013  . Intermediate coronary syndrome ([place]) 12/22/2012  . Unstable angina ([place]) 12/16/2012  . Fatigue 04/26/2012  .  Annual physical exam 10/28/2011  . Rash 10/28/2011  . Coronary Artery Disease 08/28/2011  . 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  . Palpitations 08/28/2011  . NSTEMI (non-ST elevated myocardial infarction) ([place]) 08/07/2011  . DJD (degenerative joint disease) 05/28/2011  . Poorly controlled type 2 diabetes mellitus with circulatory disorder ([place])   . Hyperlipidemia   . HTN (hypertension)    Past Medical History:  Diagnosis Date  . Arthritis    "knees probably" (12/22/2012)  . CAD (coronary artery disease)    a. NSTEMI 12/12: EF 40-45%. [id number] balloon PTCA + Promus DES x 1 to mid LAD;  b. 11/2012: Cath with Cutting balloon PTCA  LAD for ISR to LAD, EF 55%;  c. 12/2012 Cath/PCI: LAD stents patent, 80 ost Diag (jailed)->PTCA, LCX/RCA patent.  . Exertional shortness of breath   . HTN (hypertension)   . Hyperlipidemia   . Ischemic cardiomyopathy    a.  echo 08/06/11: dist ant wall, apical and septal and infero-apical HK, mild LVH, EF 40%, mild LAE, PASP 34, asc aorta mildly dilated, mild TR;  b.  Echo (3/13) showed recovery of LV systolic function with EF 50-03%, grade I diastolic dysfunction, mild MR.   . Type II diabetes mellitus ([place]) dx in the 90s    Family History  Problem Relation Age of Onset  . Diabetes Brother   . Cancer Brother   . Diabetes Mother   . Coronary artery  disease Father   . Heart attack Father 45  . Diabetes Sister   . Colon cancer Neg Hx   . Prostate cancer Neg Hx   . Stroke Neg Hx     Past Surgical History:  Procedure Laterality Date  . ANTERIOR CERVICAL DECOMP/DISCECTOMY FUSION  in the 90s  . CORONARY ANGIOPLASTY  12/15/2012; 12/22/2012  . CORONARY ANGIOPLASTY WITH STENT PLACEMENT  07/2011   "1" (12/22/2012)  . LEFT HEART CATHETERIZATION WITH CORONARY ANGIOGRAM N/A 08/06/2011   Procedure: LEFT HEART CATHETERIZATION WITH CORONARY ANGIOGRAM;  Surgeon: Burnell Blanks, MD;  Location: [facility] CATH LAB;  Service: Cardiovascular;  Laterality: N/A;  . LEFT HEART  CATHETERIZATION WITH CORONARY ANGIOGRAM N/A 12/15/2012   Procedure: LEFT HEART CATHETERIZATION WITH CORONARY ANGIOGRAM;  Surgeon: Sherren Mocha, MD;  Location: [facility] CATH LAB;  Service: Cardiovascular;  Laterality: N/A;  . LEFT HEART CATHETERIZATION WITH CORONARY ANGIOGRAM N/A 12/22/2012   Procedure: LEFT HEART CATHETERIZATION WITH CORONARY ANGIOGRAM;  Surgeon: Sherren Mocha, MD;  Location: [facility] CATH LAB;  Service: Cardiovascular;  Laterality: N/A;  . PERCUTANEOUS CORONARY STENT INTERVENTION (PCI-S) Right 12/15/2012   Procedure: PERCUTANEOUS CORONARY STENT INTERVENTION (PCI-S);  Surgeon: Sherren Mocha, MD;  Location: [facility] CATH LAB;  Service: Cardiovascular;  Laterality: Right;   Social History   Occupational History  . retired--maintenance tech    Social History Main Topics  . Smoking status: Former Smoker    Packs/day: 1.00    Years: 25.00    Types: Cigarettes    Quit date: 04/12/1992  . Smokeless tobacco: Never Used  . Alcohol use Yes     Comment: 12/22/2012 "rarely maybe every 3-4 months, beer"  . Drug use: No  . Sexual activity: Yes     Garald Balding, MD   Note - This record has been created using [employer].  Chart creation errors have been sought, but may not always  have been located. Such creation errors do not reflect on  the standard of medical care.

## 2017-01-11 ENCOUNTER — Other Ambulatory Visit: Payer: Self-pay | Admitting: Internal Medicine

## 2017-01-11 ENCOUNTER — Encounter: Payer: Self-pay | Admitting: Internal Medicine

## 2017-01-11 MED ORDER — INSULIN REGULAR HUMAN (CONC) 500 UNIT/ML ~~LOC~~ SOPN: PEN_INJECTOR | SUBCUTANEOUS | 5 refills | Status: DC

## 2017-01-12 ENCOUNTER — Ambulatory Visit: Payer: Self-pay

## 2017-01-13 ENCOUNTER — Encounter: Payer: Self-pay | Admitting: Internal Medicine

## 2017-01-13 ENCOUNTER — Ambulatory Visit (INDEPENDENT_AMBULATORY_CARE_PROVIDER_SITE_OTHER): Payer: PPO | Admitting: Internal Medicine

## 2017-01-13 ENCOUNTER — Telehealth: Payer: Self-pay

## 2017-01-13 VITALS — BP 142/84 | HR 69 | Temp 97.9°F | Resp 14 | Ht 73.0 in | Wt 303.0 lb

## 2017-01-13 DIAGNOSIS — Z0000 Encounter for general adult medical examination without abnormal findings: Secondary | ICD-10-CM | POA: Diagnosis not present

## 2017-01-13 DIAGNOSIS — I10 Essential (primary) hypertension: Secondary | ICD-10-CM

## 2017-01-13 LAB — COMPREHENSIVE METABOLIC PANEL
ALBUMIN: 3.9 g/dL (ref 3.5–5.2)
ALK PHOS: 64 U/L (ref 39–117)
ALT: 26 U/L (ref 0–53)
AST: 19 U/L (ref 0–37)
BUN: 13 mg/dL (ref 6–23)
CALCIUM: 9 mg/dL (ref 8.4–10.5)
CHLORIDE: 103 meq/L (ref 96–112)
CO2: 27 mEq/L (ref 19–32)
CREATININE: 0.79 mg/dL (ref 0.40–1.50)
GFR: 104.5 mL/min (ref >60.00)
Glucose, Bld: 260 mg/dL — ABNORMAL HIGH (ref 70–99)
Potassium: 4.1 mEq/L (ref 3.5–5.1)
Sodium: 138 mEq/L (ref 135–145)
Total Bilirubin: 0.4 mg/dL (ref 0.2–1.2)
Total Protein: 6.6 g/dL (ref 6.0–8.3)

## 2017-01-13 MED ORDER — ZOSTER VAC RECOMB ADJUVANTED 50 MCG/0.5ML IM SUSR: 0.5000 mL | Freq: Once | INTRAMUSCULAR | 1 refills | Status: AC

## 2017-01-13 MED ORDER — LISINOPRIL 20 MG PO TABS: 20.0000 mg | ORAL_TABLET | Freq: Every day | ORAL | 1 refills | Status: DC

## 2017-01-13 MED ORDER — AZELASTINE HCL 0.1 % NA SOLN: 1.0000 | Freq: Two times a day (BID) | NASAL | 5 refills | Status: DC

## 2017-01-13 MED ORDER — LOSARTAN POTASSIUM 100 MG PO TABS: 100.0000 mg | ORAL_TABLET | Freq: Every day | ORAL | 0 refills | Status: DC

## 2017-01-13 NOTE — Patient Instructions (Addendum)
GO TO THE LAB : Get the blood work     GO TO THE FRONT DESK Schedule labs to be done 2 weeks from today Schedule your next appointment for a  routine visit in 3 months Also schedule a Medicare wellness with one of our nurses ===  High blood pressure: STOP  lisinopril   START losartan once a day Check the  blood pressure 2 or 3 times a  week   Be sure your blood pressure is between 110/65 and  135/85. If it is consistently higher or lower, let me know

## 2017-01-13 NOTE — Progress Notes (Signed)
Subjective:    Patient ID: Randy Castro., male    DOB: [birth year], 66 y.o.   MRN: [id number]  DOS:  01/13/2017 Type of visit - description : Physical exam Interval history: No major concerns. BP today slightly elevated, no ambulatory BPs.  BP Readings from Last 3 Encounters:  01/13/17 (!) 142/84  11/17/16 (!) 143/69  11/13/16 134/78     Review of Systems  + Allergies for the last few weeks: runny nose, runny eyes, sneezing. Flonase is used consistently but is not helping enough Denies chest pain, edema, palpitations, no lower extremity paresthesias.   Other than above, a 14 point review of systems is negative     Past Medical History:  Diagnosis Date  . Arthritis    "knees probably" (12/22/2012)  . CAD (coronary artery disease)    a. NSTEMI 12/12: EF 40-45%. INO:MVEHMCN balloon PTCA + Promus DES x 1 to mid LAD;  b. 11/2012: Cath with Cutting balloon PTCA  LAD for ISR to LAD, EF 55%;  c. 12/2012 Cath/PCI: LAD stents patent, 80 ost Diag (jailed)->PTCA, LCX/RCA patent.  . Exertional shortness of breath   . HTN (hypertension)   . Hyperlipidemia   . Ischemic cardiomyopathy    a.  echo 08/06/11: dist ant wall, apical and septal and infero-apical HK, mild LVH, EF 40%, mild LAE, PASP 34, asc aorta mildly dilated, mild TR;  b.  Echo (3/13) showed recovery of LV systolic function with EF 47-09%, grade I diastolic dysfunction, mild MR.   . Type II diabetes mellitus ([place]) dx in the 90s    Past Surgical History:  Procedure Laterality Date  . ANTERIOR CERVICAL DECOMP/DISCECTOMY FUSION  in the 90s  . CORONARY ANGIOPLASTY  12/15/2012; 12/22/2012  . CORONARY ANGIOPLASTY WITH STENT PLACEMENT  07/2011   "1" (12/22/2012)  . KNEE ARTHROSCOPY Left 12/31/2016  . LEFT HEART CATHETERIZATION WITH CORONARY ANGIOGRAM N/A 08/06/2011   Procedure: LEFT HEART CATHETERIZATION WITH CORONARY ANGIOGRAM;  Surgeon: Burnell Blanks, MD;  Location: [facility] CATH LAB;  Service: Cardiovascular;  Laterality: N/A;    . LEFT HEART CATHETERIZATION WITH CORONARY ANGIOGRAM N/A 12/15/2012   Procedure: LEFT HEART CATHETERIZATION WITH CORONARY ANGIOGRAM;  Surgeon: Sherren Mocha, MD;  Location: [facility] CATH LAB;  Service: Cardiovascular;  Laterality: N/A;  . LEFT HEART CATHETERIZATION WITH CORONARY ANGIOGRAM N/A 12/22/2012   Procedure: LEFT HEART CATHETERIZATION WITH CORONARY ANGIOGRAM;  Surgeon: Sherren Mocha, MD;  Location[facility] C[facility] LAB;  Service: Cardiovascular;  Laterality: N/A;  . PERCUTANEOUS CORONARY STENT INTERVENTION (PCI-S) Right 12/15/2012   Procedure: PERCUTANEOUS CORONARY STENT INTERVENTION (PCI-S);  Surgeon: Sherren Mocha, MD;  Location: [facility] CATH LAB;  Service: Cardiovascular;  Laterality: Right;    Family History  Problem Relation Age of Onset  . Diabetes Brother   . Cancer Brother   . Diabetes Mother   . Coronary artery disease Father   . Heart attack Father 53  . Diabetes Sister   . Colon cancer Neg Hx   . Prostate cancer Neg Hx   . Stroke Neg Hx     Social History   Social History  . Marital status: Married    Spouse name: N/A  . Number of children: 2   . Years of education: N/A   Occupational History  . retired--maintenance tech    Social History Main Topics  . Smoking status: Former Smoker    Packs/day: 1.00    Years: 25.00    Types: Cigarettes    Quit date: 04/12/1992  . Smokeless  tobacco: Never Used  . Alcohol use Yes     Comment: 12/22/2012 "rarely maybe every 3-4 months, beer"  . Drug use: No  . Sexual activity: Yes   Other Topics Concern  . Not on file   Social History Narrative   Moved back to [place] from [place]-- pt and wife      Allergies as of 01/13/2017      Reactions   Trulicity [dulaglutide] Nausea And Vomiting      Medication List       Accurate as of 01/13/17 11:59 PM. Always use your most recent med list.          acetaminophen 500 MG tablet Commonly known as:  TYLENOL Take 500 mg by mouth every 6 (six) hours as needed.   aspirin EC  81 MG tablet Take 81 mg by mouth daily.   atorvastatin 80 MG tablet Commonly known as:  LIPITOR Take 1 tablet (80 mg total) by mouth daily.   azelastine 0.1 % nasal spray Commonly known as:  ASTELIN Place 1-2 sprays into both nostrils 2 (two) times daily. Use in each nostril as directed   BD PEN NEEDLE NANO U/F 32G X 4 MM Misc Generic drug:  Insulin Pen Needle USE FOR INJECTIONS FOUR TIMES DAILY   carvedilol 12.5 MG tablet Commonly known as:  COREG Take 12.5 mg by mouth daily.   citalopram 20 MG tablet Commonly known as:  CELEXA Take 1 tablet (20 mg total) by mouth daily.   clopidogrel 75 MG tablet Commonly known as:  PLAVIX TAKE 1 TABLET BY MOUTH EVERY DAY   clotrimazole-betamethasone cream Commonly known as:  LOTRISONE Apply topically 2 (two) times daily.   famotidine 20 MG tablet Commonly known as:  PEPCID Take 20 mg by mouth 2 (two) times daily as needed for heartburn.   fenofibrate 145 MG tablet Commonly known as:  TRICOR TAKE 1 TABLET BY MOUTH EVERY DAY   glucose blood test strip Use 4x a day   Insulin Syringe-Needle U-100 31G X 5/16" 0.5 ML Misc Commonly known as:  BD INSULIN SYRINGE ULTRAFINE Use 3x a day   losartan 100 MG tablet Commonly known as:  COZAAR Take 1 tablet (100 mg total) by mouth daily.   Magnesium 250 MG Tabs Take 1 tablet by mouth.   metFORMIN 500 MG 24 hr tablet Commonly known as:  GLUCOPHAGE-XR Take 1 tablet (500 mg total) by mouth 3 (three) times daily with meals.   multivitamin with minerals Tabs tablet Take 1 tablet by mouth daily.   nitroGLYCERIN 0.4 MG SL tablet Commonly known as:  NITROSTAT Place 1 tablet (0.4 mg total) under the tongue every 5 (five) minutes as needed for chest pain.   NOVOLIN N RELION 100 UNIT/ML injection Generic drug:  insulin NPH Human Injected under skin 80 units in a.m. and 60 units before dinner   NOVOLIN R RELION 100 units/mL injection Generic drug:  insulin regular Inject 0.3-0.5 mLs  (30-50 Units total) into the skin 3 (three) times daily before meals.   insulin regular human CONCENTRATED 500 UNIT/ML kwikpen Commonly known as:  HUMULIN R U-500 KWIKPEN Inject under skin 150 units before breakfast and 120 units before dinner   omega-3 acid ethyl esters 1 g capsule Commonly known as:  LOVAZA Take 2 capsules (2 g total) by mouth 2 (two) times daily.   Potassium 99 MG Tabs Take 1 tablet by mouth daily.   spironolactone 25 MG tablet Commonly known as:  ALDACTONE TAKE 1/2 TABLET BY MOUTH EVERY DAY   Zoster Vac Recomb Adjuvanted injection Commonly known as:  SHINGRIX Inject 0.5 mLs into the muscle once.          Objective:   Physical Exam BP (!) 142/84 (BP Location: Right Arm, Patient Position: Sitting, Cuff Size: Normal)   Pulse 69   Temp 97.9 F (36.6 C) (Oral)   Resp 14   Ht 6\' 1"  (1.854 m)   Wt (!) 303 lb (137.4 kg)   SpO2 94%   BMI 39.98 kg/m   General:   Well developed, well nourished . NAD.  Neck: No  thyromegaly  HEENT:  Normocephalic . Face symmetric, atraumatic Lungs:  CTA B Normal respiratory effort, no intercostal retractions, no accessory muscle use. Heart: RRR,  no murmur.  Trace pretibial edema bilaterally  Abdomen:  Not distended, soft, non-tender. No rebound or rigidity.   DIABETIC FEET EXAM: No lower extremity edema Normal pedal pulses bilaterally Skin normal, nails normal, no calluses Pinprick examination of the feet normal.  Neurologic:  alert & oriented X3.  Speech normal, gait appropriate for age and unassisted Strength symmetric and appropriate for age.  Psych: Cognition and judgment appear intact.  Cooperative with normal attention span and concentration.  Behavior appropriate. No anxious or depressed appearing.    Assessment & Plan:   Assessment   DM -- insulin dependent, uncontrolled, with complications, CAD CHF. Dr Cruzita Lederer HTN Hyperlipidemia CAD, CHF, ischemic cardiomyopathy NSTEMI in 12/12, unstable  angina in 4/14 and 5/14. He had DES to mid LAD in 12/12. EF was 40% with apical hypokinesis at the time of the MI. Repeat echo in 3/13 showed EF 55-60%. In 4/14, he was admitted with unstable angina and had 90% pLAD in-stent restenosis. This was treated with cutting balloon angioplasty. Readmitted, again with unstable angina, in 5/14. This time he had PTCA to 90% ostial D1 stenosis.  Anxiety-depression Skin cancer: Right leg, status post excision 2016; Dr Allyson Sabal  PLAN: DM: Not controlled, has been unable to exercise for the last few days due to knee surgery, diet encouraged. Foot exam negative. HTN: Controlled? Last BPs  slightly elevated, stop  lisinopril 10 bid, change to losartan 100 mg, CMP today and BMP in 2 weeks.see AVS Allergies: Continue Flonase, add Astelin RTC 3 months

## 2017-01-13 NOTE — Assessment & Plan Note (Addendum)
--  Td  2014; Pneumonia shot 12-12; prevnar 2016; zostavax 2014; shingrex discussed , Rx provided --CCS: never screened, iFOB last year not received; discussed Cscope vs cologuard vs iFOB >>>> elected Cologuard -- Prostate ca screening: guidelines discussed,previous PSAs wnl,  pt elected no further screenings --diet and exercise discussed. --Labs: CMP today, BMP in 2 weeks because  we are switching lisinopril to losartan

## 2017-01-13 NOTE — Progress Notes (Signed)
Pre visit review using our clinic review tool, if applicable. No additional management support is needed unless otherwise documented below in the visit note. 

## 2017-01-13 NOTE — Telephone Encounter (Signed)
Cologuard kit ordered, ordered confirmation: [id number].

## 2017-01-14 ENCOUNTER — Other Ambulatory Visit: Payer: Self-pay | Admitting: Internal Medicine

## 2017-01-14 ENCOUNTER — Other Ambulatory Visit: Payer: Self-pay

## 2017-01-14 NOTE — Assessment & Plan Note (Signed)
DM: Not controlled, has been unable to exercise for the last few days due to knee surgery, diet encouraged. Foot exam negative. HTN: Controlled? Last BPs  slightly elevated, stop  lisinopril 10 bid, change to losartan 100 mg, CMP today and BMP in 2 weeks.see AVS Allergies: Continue Flonase, add Astelin RTC 3 months

## 2017-01-14 NOTE — Patient Outreach (Signed)
[facility]) Care Management  [place]   01/14/2017  Nuh Lipton 01-17-1951 [id number]  Subjective: Mr. Stuard is a 66 year old male who I am assisting with getting Humulin R U-500 from the pharmaceutical company.  He was about to run out of medication and needed another refill to qualify for the $1100 out of pocket spend.  He could not afford the $409.69 co-pay so he qualified for the Emergency Pharmacy Fund to prevent a hospitalization.  I also picked up lisinopril and azelastine nasal spray.  When I reviewed his medication list, I noted his lisinopril was discontinued and Losartan was started.  I confirmed this with his wife.  She stated the losartan was just left at the pharmacy today and will be ready this afternoon.  She will not give the lisinopril to him anymore.  She will discard it.  She had her letter regarding his expenses ready for me as well.  I will send it with his application for Humulin R U-500.    Objective:   Encounter Medications: Outpatient Encounter Prescriptions as of 01/14/2017  Medication Sig Note  . acetaminophen (TYLENOL) 500 MG tablet Take 500 mg by mouth every 6 (six) hours as needed.   Marland Kitchen aspirin EC 81 MG tablet Take 81 mg by mouth daily.   Marland Kitchen atorvastatin (LIPITOR) 80 MG tablet Take 1 tablet (80 mg total) by mouth daily.   Marland Kitchen azelastine (ASTELIN) 0.1 % nasal spray Place 1-2 sprays into both nostrils 2 (two) times daily. Use in each nostril as directed   . carvedilol (COREG) 12.5 MG tablet Take 12.5 mg by mouth daily.   . citalopram (CELEXA) 20 MG tablet Take 1 tablet (20 mg total) by mouth daily.   . clopidogrel (PLAVIX) 75 MG tablet TAKE 1 TABLET BY MOUTH EVERY DAY   . clotrimazole-betamethasone (LOTRISONE) cream Apply topically 2 (two) times daily. (Patient taking differently: Apply topically 2 (two) times daily as needed. )   . famotidine (PEPCID) 20 MG tablet Take 20 mg by mouth 2 (two) times daily as needed for heartburn.   .  fenofibrate (TRICOR) 145 MG tablet TAKE 1 TABLET BY MOUTH EVERY DAY   . insulin regular human CONCENTRATED (HUMULIN R U-500 KWIKPEN) 500 UNIT/ML kwikpen Inject under skin 150 units before breakfast and 120 units before dinner   . losartan (COZAAR) 100 MG tablet Take 1 tablet (100 mg total) by mouth daily.   . Magnesium 250 MG TABS Take 1 tablet by mouth.   . metFORMIN (GLUCOPHAGE-XR) 500 MG 24 hr tablet Take 1 tablet (500 mg total) by mouth 3 (three) times daily with meals.   . Multiple Vitamin (MULTIVITAMIN WITH MINERALS) TABS Take 1 tablet by mouth daily.   Marland Kitchen omega-3 acid ethyl esters (LOVAZA) 1 g capsule Take 2 capsules (2 g total) by mouth 2 (two) times daily.   . Potassium 99 MG TABS Take 1 tablet by mouth daily.   Marland Kitchen spironolactone (ALDACTONE) 25 MG tablet TAKE 1/2 TABLET BY MOUTH EVERY DAY   . BD PEN NEEDLE NANO U/F 32G X 4 MM MISC USE FOR INJECTIONS FOUR TIMES DAILY (Patient not taking: Reported on 01/13/2017)   . glucose blood test strip Use 4x a day (Patient not taking: Reported on 01/13/2017)   . Insulin Syringe-Needle U-100 (BD INSULIN SYRINGE ULTRAFINE) 31G X 5/16" 0.5 ML MISC Use 3x a day (Patient not taking: Reported on 01/13/2017)   . nitroGLYCERIN (NITROSTAT) 0.4 MG SL tablet Place 1  tablet (0.4 mg total) under the tongue every 5 (five) minutes as needed for chest pain. (Patient not taking: Reported on 01/13/2017) 01/13/2017: PRN  . NOVOLIN N RELION 100 UNIT/ML injection Injected under skin 80 units in a.m. and 60 units before dinner (Patient not taking: Reported on 12/24/2016) 12/24/2016: Patient uses only when he is unable to afford Humulin U-500.    Marland Kitchen NOVOLIN R RELION 100 UNIT/ML injection Inject 0.3-0.5 mLs (30-50 Units total) into the skin 3 (three) times daily before meals. (Patient not taking: Reported on 12/24/2016) 12/24/2016: Patient uses only when he is unable to afford Humulin U-500   No facility-administered encounter medications on file as of 01/14/2017.     Functional Status: In  your present state of health, do you have any difficulty performing the following activities: 09/03/2016 01/23/2016  Hearing? N N  Vision? N N  Difficulty concentrating or making decisions? N N  Walking or climbing stairs? N N  Dressing or bathing? N N  Doing errands, shopping? N N  Some recent data might be hidden    Fall/Depression Screening: Fall Risk  09/03/2016 01/23/2016 01/02/2016  Falls in the past year? No No No   PHQ 2/9 Scores 09/03/2016 01/23/2016 01/02/2016  PHQ - 2 Score 0 0 0  Exception Documentation - Patient refusal -    Assessment: Medication assistance:  I have all the paperwork from Mr. Sermons.  I will get the out of pocket spend from [employer] to confirm the $1100 spend and send the paperwork to Dr. Cruzita Lederer.    Plan: 1.  I ask for the out of pocket spend from [employer] to confirm the $1100 spend. 2.  I will send the paperwork to Dr. Cruzita Lederer to complete. 3.  I will follow up in 3 weeks to make sure everything is completed and sent.    Deanne Coffer, PharmD, [employer] [phone]

## 2017-01-20 ENCOUNTER — Encounter (INDEPENDENT_AMBULATORY_CARE_PROVIDER_SITE_OTHER): Payer: Self-pay | Admitting: Orthopaedic Surgery

## 2017-01-20 ENCOUNTER — Ambulatory Visit (INDEPENDENT_AMBULATORY_CARE_PROVIDER_SITE_OTHER): Payer: PPO | Admitting: Orthopaedic Surgery

## 2017-01-20 VITALS — BP 123/96 | HR 85 | Resp 14 | Ht 72.0 in | Wt 285.0 lb

## 2017-01-20 DIAGNOSIS — M25562 Pain in left knee: Secondary | ICD-10-CM

## 2017-01-20 DIAGNOSIS — G8929 Other chronic pain: Secondary | ICD-10-CM

## 2017-01-20 NOTE — Progress Notes (Signed)
Office Visit Note   Patient: Randy Castro.           Date of Birth: 1951-06-25           MRN: [id number] Visit Date: 01/20/2017              Requested by: Colon Branch, MD [address] [place], [place] [address] PCP: Colon Branch, MD   Assessment & Plan: Visit Diagnoses:  1. Chronic pain of left knee   3 weeks status post left knee arthroscopy and doing well.  Plan: Exercises, weight loss, office 1 month  Follow-Up Instructions: Return in about 1 month (around 02/20/2017).   Orders:  No orders of the defined types were placed in this encounter.  No orders of the defined types were placed in this encounter.     Procedures: No procedures performed   Clinical Data: No additional findings.   Subjective: Chief Complaint  Patient presents with  . Left Knee - Routine Post Op    Randy Castro is 3 weeks status post Left Knee arthroscopy. She reltes he may have overused the knee when walking but rested and felt better.  Left knee still feels better than it did before surgery. Having some medial joint pain consistent with a partially resected medial meniscus and the degenerative changes. No fever or chills.  HPI  Review of Systems   Objective: Vital Signs: BP (!) 123/96   Pulse 85   Resp 14   Ht 6' (1.829 m)   Wt 285 lb (129.3 kg)   BMI 38.65 kg/m   Physical Exam  Ortho Exam left knee with minimal effusion. The knee was not hot warm or swollen. Mild medial joint pain full extension no popliteal pain or calf discomfort. No distal neuropathy. Flexes over 105. Some quad weakness  Specialty Comments:  No specialty comments available.  Imaging: No results found.   PMFS History: Patient Active Problem List   Diagnosis Date Noted  . Obesity 01/23/2016  . PCP NOTES >>> 06/08/2015  . Anxiety and depression 05/31/2013  . Intermediate coronary syndrome ([place]) 12/22/2012  . Unstable angina ([place]) 12/16/2012  . Fatigue 04/26/2012  . Annual physical exam  10/28/2011  . Rash 10/28/2011  . Coronary Artery Disease 08/28/2011  . 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  . Palpitations 08/28/2011  . NSTEMI (non-ST elevated myocardial infarction) ([place]) 08/07/2011  . DJD (degenerative joint disease) 05/28/2011  . Poorly controlled type 2 diabetes mellitus with circulatory disorder ([place])   . Hyperlipidemia   . HTN (hypertension)    Past Medical History:  Diagnosis Date  . Arthritis    "knees probably" (12/22/2012)  . CAD (coronary artery disease)    a. NSTEMI 12/12: EF 40-45%. SHF:WYOVZCH balloon PTCA + Promus DES x 1 to mid LAD;  b. 11/2012: Cath with Cutting balloon PTCA  LAD for ISR to LAD, EF 55%;  c. 12/2012 Cath/PCI: LAD stents patent, 80 ost Diag (jailed)->PTCA, LCX/RCA patent.  . Exertional shortness of breath   . HTN (hypertension)   . Hyperlipidemia   . Ischemic cardiomyopathy    a.  echo 08/06/11: dist ant wall, apical and septal and infero-apical HK, mild LVH, EF 40%, mild LAE, PASP 34, asc aorta mildly dilated, mild TR;  b.  Echo (3/13) showed recovery of LV systolic function with EF 88-50%, grade I diastolic dysfunction, mild MR.   . Type II diabetes mellitus ([place]) dx in the 90s    Family History  Problem  Relation Age of Onset  . Diabetes Brother   . Cancer Brother   . Diabetes Mother   . Coronary artery disease Father   . Heart attack Father 34  . Diabetes Sister   . Colon cancer Neg Hx   . Prostate cancer Neg Hx   . Stroke Neg Hx     Past Surgical History:  Procedure Laterality Date  . ANTERIOR CERVICAL DECOMP/DISCECTOMY FUSION  in the 90s  . CORONARY ANGIOPLASTY  12/15/2012; 12/22/2012  . CORONARY ANGIOPLASTY WITH STENT PLACEMENT  07/2011   "1" (12/22/2012)  . KNEE ARTHROSCOPY Left 12/31/2016  . LEFT HEART CATHETERIZATION WITH CORONARY ANGIOGRAM N/A 08/06/2011   Procedure: LEFT HEART CATHETERIZATION WITH CORONARY ANGIOGRAM;  Surgeon: Burnell Blanks, MD;  Location[facility] CATH LAB;  Service: Cardiovascular;  Laterality: N/A;    . LEFT HEART CATHETERIZATION WITH CORONARY ANGIOGRAM N/A 12/15/2012   Procedure: LEFT HEART CATHETERIZATION WITH CORONARY ANGIOGRAM;  Surgeon: Sherren Mocha, MD;  Location: [facility] CATH LAB;  Service: Cardiovascular;  Laterality: N/A;  . LEFT HEART CATHETERIZATION WITH CORONARY ANGIOGRAM N/A 12/22/2012   Procedure: LEFT HEART CATHETERIZATION WITH CORONARY ANGIOGRAM;  Surgeon: Sherren Mocha, MD;  Location: [facility] CATH LAB;  Service: Cardiovascular;  Laterality: N/A;  . PERCUTANEOUS CORONARY STENT INTERVENTION (PCI-S) Right 12/15/2012   Procedure: PERCUTANEOUS CORONARY STENT INTERVENTION (PCI-S);  Surgeon: Sherren Mocha, MD;  Location[facility] CATH LAB;  Service: Cardiovascular;  Laterality: Right;   Social History   Occupational History  . retired--maintenance tech    Social History Main Topics  . Smoking status: Former Smoker    Packs/day: 1.00    Years: 25.00    Types: Cigarettes    Quit date: 04/12/1992  . Smokeless tobacco: Never Used  . Alcohol use Yes     Comment: 12/22/2012 "rarely maybe every 3-4 months, beer"  . Drug use: No  . Sexual activity: Yes     Randy Balding, MD   Note - This record has been created using [employer].  Chart creation errors have been sought, but may not always  have been located. Such creation errors do not reflect on  the standard of medical care.

## 2017-01-21 ENCOUNTER — Ambulatory Visit (INDEPENDENT_AMBULATORY_CARE_PROVIDER_SITE_OTHER): Payer: PPO | Admitting: Internal Medicine

## 2017-01-21 ENCOUNTER — Encounter: Payer: Self-pay | Admitting: Internal Medicine

## 2017-01-21 VITALS — BP 150/84 | HR 75 | Ht 74.0 in | Wt 305.0 lb

## 2017-01-21 DIAGNOSIS — E1165 Type 2 diabetes mellitus with hyperglycemia: Secondary | ICD-10-CM

## 2017-01-21 DIAGNOSIS — E1159 Type 2 diabetes mellitus with other circulatory complications: Secondary | ICD-10-CM

## 2017-01-21 LAB — POCT GLYCOSYLATED HEMOGLOBIN (HGB A1C): Hemoglobin A1C: 9.7

## 2017-01-21 MED ORDER — INSULIN REGULAR HUMAN (CONC) 500 UNIT/ML ~~LOC~~ SOPN: PEN_INJECTOR | SUBCUTANEOUS | 5 refills | Status: DC

## 2017-01-21 MED ORDER — GLUCOSE BLOOD VI STRP: ORAL_STRIP | 11 refills | Status: DC

## 2017-01-21 NOTE — Addendum Note (Signed)
Addended by: Caprice Beaver T on: 01/21/2017 09:55 AM   Modules accepted: Orders

## 2017-01-21 NOTE — Patient Instructions (Addendum)
Please change: - U500 insulin: - 160 units before b'fast - 110 units before dinner - Metformin 500 mg 3x a day  If you start exercising: - U500 insulin: - 140 units before b'fast - 110 units before dinner - Metformin 500 mg 3x a day  Please return in 3 months with your sugar log.

## 2017-01-21 NOTE — Progress Notes (Signed)
Subjective:     Patient ID: Randy Foots., male   DOB: [birth year], 66 y.o.   MRN: [id number]  HPI Review of Systems Objective:   Physical Exam Assessment:   HPI Randy Castro is a pleasant 66 y.o. man returning for f/u for DM2, dx 1998, uncontrolled, insulin dependent, with complications (CAD, s/p MI 07/2011, s/p PTCA with DES, s/p PTCA for in-stent restenosis 12/15/12; also ED).  Last visit was 2 mo ago.  He has L knee pain after his knee surgery 3 weeks ago. He was completely inactive since then >> starts to exercise, but still painful.   Reviewed HbA1c levels:  Lab Results  Component Value Date   HGBA1C 9.8 09/18/2016   HGBA1C 9.4 05/25/2016   HGBA1C 8.8 01/23/2016   He was on a regimen of: - Basaglar 75 units at bedtime - not covered anymore - Metformin ER 500 mg 3x a day - Jardiance 25 mg in am  - not covered anymore - NovoLog: - very expensive 25 units with a smaller meal 35 units with a large meal  We started U500 insulin, but we had a very difficult time with his insurance covering it >> he gets it from [facility] now:  - U500 insulin: - 150 units before b'fast - 120 units before dinner - Metformin 500 mg 3x a day  We tried to start Bydureon 2 g weekly 05/2016 >> not covered We stopped Glipizide when we started mealtime insulin. We stopped Bydureon when we started mealtime insulin We stopped Januvia 100 in 12/2013. He was on JanuMet 50/1000 bid >> stopped 2/2 diarrhea, fatigue >> restarted but with same SEs >> stopped. He retired on 08/15/2013 >> less activity, increased weight. Sugars have greatly increased >> we added Invokana. We had to stop this and switch to [place] per insurance coverage. He could not take maximum dose of metformin XR due to diarrhea. He could not tolerate Trulicity due to nausea and vomiting. We had to stop Toujeo 2/2 insurance coverage.  He checks his sugars 2-3x a day: - am: 113-221, 267 >> 129-323 >> 268-370 >> 116-278 in last 3  weeks - after b'fast: 367 >> 158 >> n/c >> 231, 315 >> n/c  - before lunch: 129-213, 247 >> 197-283 >> 263-320 >> 186, 268, memorial day: 326 - 2h after lunch: 143 >> n/c - before dinner: 111-119 >> 177, 233, 303 >> 280 >> 158-221 - bedtime:  1301 >> 178, 240 >> n/c >> 140, 205 >> n/c - nighttime: 3 lows 58-60s >> n/c >> 103 >> n/c >> 84, 114-132 No lows. He has hypoglycemia awareness at 66.  - No CKD. Last BUN/Cr:  Lab Results  Component Value Date   BUN 13 01/13/2017   CREATININE 0.79 01/13/2017  On Losartan now.  - No neuropathy per foot exam 01/02/2016. - He has HL: Lab Results  Component Value Date   CHOL 143 05/18/2016   HDL 28 (L) 05/18/2016   LDLCALC 46 05/18/2016   LDLDIRECT 64.0 01/02/2016   TRIG 343 (H) 05/18/2016   CHOLHDL 5.1 (H) 05/18/2016  On Lipitor. - no DR per last eye exam on 12/2015 >> no DR. He is on ASA 81.  PMH:   Pt had a period off the meds for cost reasons and was restarted on DM meds in 03/2011. In 07/2011, he had a NSTEMI and had a DES placed. He also has CHF, improved (EF increased from 40-45% in 07/2011 to 55-60% in 10/2011). Sees Dr.  McLean. He had CP/SOB >> saw Dr. Percival Spanish on 12/15/3012 >> heart catheterization >> PTCA for LAD instent restenosis.  Review of Systems Constitutional: no weight gain/no weight loss, no fatigue, no subjective hyperthermia, no subjective hypothermia Eyes: no blurry vision, no xerophthalmia ENT: no sore throat, no nodules palpated in throat, no dysphagia, no odynophagia, no hoarseness Cardiovascular: no CP/no SOB/no palpitations/no leg swelling Respiratory: no cough/no SOB/no wheezing Gastrointestinal: no N/no V/no D/no C/no acid reflux Musculoskeletal: no muscle aches/+ joint aches Skin: no rashes, no hair loss Neurological: no tremors/no numbness/no tingling/no dizziness  I reviewed pt's medications, allergies, PMH, social hx, family hx, and changes were documented in the history of present illness. Otherwise,  unchanged from my initial visit note.   Objective:   Physical Exam Ht 6\' 2"  (1.88 m)   Wt (!) 305 lb (138.3 kg)   BMI 39.16 kg/m  Body mass index is 39.16 kg/m.  Wt Readings from Last 3 Encounters:  01/21/17 (!) 305 lb (138.3 kg)  01/20/17 285 lb (129.3 kg)  01/13/17 (!) 303 lb (137.4 kg)   Constitutional: overweight, in NAD Eyes: PERRLA, EOMI, no exophthalmos ENT: moist mucous membranes, no thyromegaly, no cervical lymphadenopathy Cardiovascular: RRR, No MRG Respiratory: CTA B Gastrointestinal: abdomen soft, NT, ND, BS+ Musculoskeletal: no deformities, strength intact in all 4 Skin: moist, warm, no rashes Neurological: no tremor with outstretched hands, DTR normal in all 4 Assessment:     1. DM2, insulin dependent, uncontrolled, with complications: - CAD, s/p NSTEMI 07/2011, with PTCA + DES (Dr. Aundra Dubin), s/p PTCA for in-stent restenosis 12/15/2012- CHF, EF 55-60% per 2D ECHO in 10/2011 - ED - no DR - no nephropathy  2. Obesity    Plan:     Pt with long standing uncontrolled DM Now with improving blood sugars on U 500 insulin regimen - his sugars after he started to improve approximately 3 weeks ago. He is still not active after his knee surgery, But plans to restart going to the gym.  - We discussed about using an insulin regimen when he is less active and adjusting it when he starts going to the gym. For now, I increased his breakfast insulin dose and decrease to dinnertime dose, since he has mild low blood sugars overnight. When he started going to the gym, he will need less insulin in the morning. -  last HbA1c was very high, at 9.8% - I advised him to: Patient Instructions  Please change: - U500 insulin: - 160 units before b'fast - 110 units before dinner - Metformin 500 mg 3x a day  If you start exercising: - U500 insulin: - 140 units before b'fast - 110 units before dinner - Metformin 500 mg 3x a day  Please return in 3 months with your sugar log.   -  today, HbA1c is 9.7% (stable) - continue checking sugars at different times of the day - check 3x a day, rotating checks - advised for yearly eye exams >> he needs one  - Return to clinic in 3 mo with sugar log   2. Obesity - stable - will restart going to the gym  Philemon Kingdom, MD PhD [facility]

## 2017-01-24 DIAGNOSIS — Z1211 Encounter for screening for malignant neoplasm of colon: Secondary | ICD-10-CM | POA: Diagnosis not present

## 2017-01-25 LAB — COLOGUARD: Cologuard: NEGATIVE

## 2017-01-27 ENCOUNTER — Other Ambulatory Visit (INDEPENDENT_AMBULATORY_CARE_PROVIDER_SITE_OTHER): Payer: PPO

## 2017-01-27 DIAGNOSIS — I10 Essential (primary) hypertension: Secondary | ICD-10-CM

## 2017-01-27 LAB — BASIC METABOLIC PANEL
BUN: 13 mg/dL (ref 6–23)
CALCIUM: 9.1 mg/dL (ref 8.4–10.5)
CO2: 27 mEq/L (ref 19–32)
CREATININE: 0.78 mg/dL (ref 0.40–1.50)
Chloride: 103 mEq/L (ref 96–112)
GFR: 106.03 mL/min (ref >60.00)
GLUCOSE: 221 mg/dL — AB (ref 70–99)
Potassium: 4.1 mEq/L (ref 3.5–5.1)
Sodium: 138 mEq/L (ref 135–145)

## 2017-02-01 NOTE — Telephone Encounter (Signed)
Received negative Cologuard results, next to be completed in 3 years.

## 2017-02-04 ENCOUNTER — Ambulatory Visit: Payer: Self-pay

## 2017-02-10 ENCOUNTER — Encounter: Payer: Self-pay | Admitting: Cardiology

## 2017-02-15 ENCOUNTER — Other Ambulatory Visit: Payer: Self-pay | Admitting: *Deleted

## 2017-02-15 ENCOUNTER — Encounter: Payer: Self-pay | Admitting: *Deleted

## 2017-02-15 ENCOUNTER — Encounter: Payer: Self-pay | Admitting: Internal Medicine

## 2017-02-15 NOTE — Patient Outreach (Signed)
[facility] [facility]) Care Management  02/15/2017  Randy Castro. 05-31-1951 [id number]  New referral received from [place] ([place] [facility]). Randy Castro is a 66 year old gentleman who lives in [place] with [name]. His medical history includes Uncontrolled Insulin DependentType II Diabetes Mellitus (diagnosed 1998), hypertension, hyperlipidemia, CAD s/p MI in 2012, s/p PTCA x 2, ischemic cardiomyopathy, degenerative joint disease with left knee arthroscopy and partial medial meniscectomy on May 1 of this year.   Medication Management/patient Assistance - Randy Castro was originally referred to our [place] team for medication assistance as he was having difficulty affording his insulin. Surveyor, quantity Pharmacy services [place] [facility], [name], BCACP engaged Randy Castro who qualified for emergency assistance on 01/14/17 and was assisted with Insulin and Losartan. Dr. Cyndia Castro reviewed medications in detail at that time with Randy Castro and his wife and sent an application for completion to [name] for Humulin R U-500 manufacturer assistance. According to [place], Randy Castro continues to report difficulty affording insulin and requests ongoing pharmacy input/assistance.   Diabetes Management - Randy Castro was also referred to [place] for nursing intervention for diabetes education as his glucose is still poorly controlled.   Plan: I will reach out to Randy Castro on Tuesday 02/16/17 to set up a time to visit with him/his wife in person for an initial visit for the purpose of assessment and diabetes management.    [place] [facility]  [phone]

## 2017-02-16 ENCOUNTER — Other Ambulatory Visit: Payer: Self-pay | Admitting: *Deleted

## 2017-02-16 NOTE — Patient Outreach (Signed)
[facility]) Care Management  02/16/2017  Randy Castro. 1950-08-27 [id number]  [name] is a 65 year old gentleman who lives in [place] with [name]. His medical history includes Uncontrolled Insulin DependentType II Diabetes Mellitus (diagnosed 1998), hypertension, hyperlipidemia, CAD s/p MI in 2012, s/p PTCA x 2, ischemic cardiomyopathy, degenerative joint disease with left knee arthroscopy and partial medial meniscectomy on May 1 of this year.   [name] was referred to [place] for nursing intervention for diabetes education as his glucose is still poorly controlled. I spoke with Mrs. Randy Castro on the phone today. [name] was not in but Randy Castro told me he was expecting our call. She agreed to a face to face appointment on Thursday 02/18/17 @ 11am.   Plan: I will see [name] on Thursday for our initial home visit.    [place] [facility]  [phone]

## 2017-02-17 ENCOUNTER — Ambulatory Visit (INDEPENDENT_AMBULATORY_CARE_PROVIDER_SITE_OTHER): Payer: PPO | Admitting: Orthopaedic Surgery

## 2017-02-17 DIAGNOSIS — G8929 Other chronic pain: Secondary | ICD-10-CM

## 2017-02-17 DIAGNOSIS — M25562 Pain in left knee: Secondary | ICD-10-CM

## 2017-02-17 NOTE — Progress Notes (Signed)
Office Visit Note   Patient: Randy Castro.           Date of Birth: 03-24-1951           MRN: [id number] Visit Date: 02/17/2017              Requested by: Colon Branch, MD [address] [place], [place] [address] PCP: Colon Branch, MD   Assessment & Plan: Visit Diagnoses:  1. Chronic pain of left knee   6 weeks status post left knee arthroscopy with evidence of degenerative arthritis and a tear of the medial meniscus. Doing well feels much better he did before surgery.  Plan: Encourage exercises, office as needed. Discussed potential treatments for arthritis over time including cortisone injections and possibly Visco supplementation.  Follow-Up Instructions: Return if symptoms worsen or fail to improve.   Orders:  No orders of the defined types were placed in this encounter.  No orders of the defined types were placed in this encounter.     Procedures: No procedures performed   Clinical Data: No additional findings.   Subjective: No chief complaint on file.  6 weeks status post left knee arthroscopy and doing quite well. Mr. Allman relates it is much. When he was before surgery with less stiffness and soreness. Still has some discomfort with weather changes HPI  Review of Systems   Objective: Vital Signs: There were no vitals taken for this visit.  Physical Exam  Ortho Exam left knee with full range of motion i.e. full extension and overall 110 of flexion. No instability. No medial lateral joint pain. Mild patella crepitation. No effusion.  Specialty Comments:  No specialty comments available.  Imaging: No results found.   PMFS History: Patient Active Problem List   Diagnosis Date Noted  . Obesity 01/23/2016  . PCP NOTES >>> 06/08/2015  . Anxiety and depression 05/31/2013  . Intermediate coronary syndrome ([place]) 12/22/2012  . Unstable angina ([place]) 12/16/2012  . Fatigue 04/26/2012  . Annual physical exam 10/28/2011  . Rash 10/28/2011    . Coronary Artery Disease 08/28/2011  . 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  . Palpitations 08/28/2011  . NSTEMI (non-ST elevated myocardial infarction) ([place]) 08/07/2011  . DJD (degenerative joint disease) 05/28/2011  . Poorly controlled type 2 diabetes mellitus with circulatory disorder ([place])   . Hyperlipidemia   . HTN (hypertension)    Past Medical History:  Diagnosis Date  . Arthritis    "knees probably" (12/22/2012)  . CAD (coronary artery disease)    a. NSTEMI 12/12: EF 40-45%. [id number] balloon PTCA + Promus DES x 1 to mid LAD;  b. 11/2012: Cath with [place] balloon PTCA  LAD for ISR to LAD, EF 55%;  c. 12/2012 Cath/PCI: LAD stents patent, 80 ost Diag (jailed)->PTCA, LCX/RCA patent.  . Exertional shortness of breath   . HTN (hypertension)   . Hyperlipidemia   . Ischemic cardiomyopathy    a.  echo 08/06/11: dist ant wall, apical and septal and infero-apical HK, mild LVH, EF 40%, mild LAE, PASP 34, asc aorta mildly dilated, mild TR;  b.  Echo (3/13) showed recovery of LV systolic function with EF 21-30%, grade I diastolic dysfunction, mild MR.   . Type II diabetes mellitus ([place]) dx in the 90s    Family History  Problem Relation Age of Onset  . Diabetes Brother   . Cancer Brother   . Diabetes Mother   . Coronary artery disease Father   . Heart attack  Father 91  . Diabetes Sister   . Colon cancer Neg Hx   . Prostate cancer Neg Hx   . Stroke Neg Hx     Past Surgical History:  Procedure Laterality Date  . ANTERIOR CERVICAL DECOMP/DISCECTOMY FUSION  in the 90s  . CORONARY ANGIOPLASTY  12/15/2012; 12/22/2012  . CORONARY ANGIOPLASTY WITH STENT PLACEMENT  07/2011   "1" (12/22/2012)  . KNEE ARTHROSCOPY Left 12/31/2016  . LEFT HEART CATHETERIZATION WITH CORONARY ANGIOGRAM N/A 08/06/2011   Procedure: LEFT HEART CATHETERIZATION WITH CORONARY ANGIOGRAM;  Surgeon: Burnell Blanks, MD;  [facility];  Service: Cardiovascular;  Laterality: N/A;  . LEFT HEART CATHETERIZATION  WITH CORONARY ANGIOGRAM N/A 12/15/2012   Procedure: LEFT HEART CATHETERIZATION WITH CORONARY ANGIOGRAM;  Surgeon: Sherren Mocha, MD;  Location[facility] CATH LAB;  Service: Cardiovascular;  Laterality: N/A;  . LEFT HEART CATHETERIZATION WITH CORONARY ANGIOGRAM N/A 12/22/2012   Procedure: LEFT HEART CATHETERIZATION WITH CORONARY ANGIOGRAM;  Surgeon: Sherren Mocha, MD;  Location: [facility] CATH LAB;  Service: Cardiovascular;  Laterality: N/A;  . PERCUTANEOUS CORONARY STENT INTERVENTION (PCI-S) Right 12/15/2012   Procedure: PERCUTANEOUS CORONARY STENT INTERVENTION (PCI-S);  Surgeon: Sherren Mocha, MD;  Location: [facility] CATH LAB;  Service: Cardiovascular;  Laterality: Right;   Social History   Occupational History  . retired--maintenance tech    Social History Main Topics  . Smoking status: Former Smoker    Packs/day: 1.00    Years: 25.00    Types: Cigarettes    Quit date: 04/12/1992  . Smokeless tobacco: Never Used  . Alcohol use Yes     Comment: 12/22/2012 "rarely maybe every 3-4 months, beer"  . Drug use: No  . Sexual activity: Yes     Garald Balding, MD   Note - This record has been created using [employer].  Chart creation errors have been sought, but may not always  have been located. Such creation errors do not reflect on  the standard of medical care.

## 2017-02-18 ENCOUNTER — Other Ambulatory Visit: Payer: Self-pay | Admitting: *Deleted

## 2017-02-18 NOTE — Patient Outreach (Signed)
[facility]) Care Management   02/18/2017  Randy Castro. [birth year] [id number]  Randy Castro. is an 66 y.o.   gentleman who lives in [place] with [name]. His medical history includes Uncontrolled Insulin DependentType II Diabetes Mellitus (diagnosed 1998), hypertension, hyperlipidemia, CAD s/p MI in 2012, s/p PTCA x 2, ischemic cardiomyopathy, degenerative joint disease with left knee arthroscopy and partial medial meniscectomy on May 1 of this year.   Subjective: "I know I just have to get it together and get this weight off. I've done it before and I know I can do it again."  Objective:  BP 140/80   Pulse 68   Ht 1.88 m (6\' 2" )   Wt (!) 305 lb (138.3 kg)   SpO2 96%   BMI 39.16 kg/m   Review of Systems  Constitutional: Negative.   HENT: Negative.   Eyes: Negative.   Respiratory: Negative.   Cardiovascular: Negative.   Gastrointestinal: Negative.   Genitourinary: Negative.   Musculoskeletal: Positive for joint pain and myalgias.       Knee pain improving (recent arthroscopy w/ medial meniscectomy)  Skin: Negative.   Neurological: Negative.   Psychiatric/Behavioral: Negative.     Physical Exam  Constitutional: He is oriented to person, place, and time. Vital signs are normal. He appears well-developed and well-nourished. He is active. He does not have a sickly appearance. He does not appear ill.  Cardiovascular: Normal rate, regular rhythm and normal heart sounds.   Respiratory: Effort normal and breath sounds normal. He has no wheezes. He has no rhonchi. He has no rales.  GI: Soft. Bowel sounds are normal.  Neurological: He is alert and oriented to person, place, and time.  Skin: Skin is warm, dry and intact.  Psychiatric: He has a normal mood and affect. His speech is normal and behavior is normal. Judgment and thought content normal. Cognition and memory are normal.    Encounter Medications:   Outpatient Encounter Prescriptions as of  02/18/2017  Medication Sig Note  . acetaminophen (TYLENOL) 500 MG tablet Take 500 mg by mouth every 6 (six) hours as needed.   Marland Kitchen aspirin EC 81 MG tablet Take 81 mg by mouth daily.   Marland Kitchen atorvastatin (LIPITOR) 80 MG tablet Take 1 tablet (80 mg total) by mouth daily.   Marland Kitchen azelastine (ASTELIN) 0.1 % nasal spray Place 1-2 sprays into both nostrils 2 (two) times daily. Use in each nostril as directed   . BD PEN NEEDLE NANO U/F 32G X 4 MM MISC USE FOR INJECTIONS FOUR TIMES DAILY   . carvedilol (COREG) 12.5 MG tablet Take 12.5 mg by mouth daily.   . citalopram (CELEXA) 20 MG tablet Take 1 tablet (20 mg total) by mouth daily.   . clopidogrel (PLAVIX) 75 MG tablet TAKE 1 TABLET BY MOUTH EVERY DAY   . clotrimazole-betamethasone (LOTRISONE) cream Apply topically 2 (two) times daily. (Patient taking differently: Apply topically 2 (two) times daily as needed. )   . famotidine (PEPCID) 20 MG tablet Take 20 mg by mouth 2 (two) times daily as needed for heartburn.   . fenofibrate (TRICOR) 145 MG tablet TAKE 1 TABLET BY MOUTH EVERY DAY   . glucose blood test strip Use 3x a day - for OneTouch Ultra mini   . insulin regular human CONCENTRATED (HUMULIN R U-500 KWIKPEN) 500 UNIT/ML kwikpen Inject under skin 160 units before breakfast and 110 units before dinner (Patient not taking: Reported on 02/15/2017)   . Insulin  Syringe-Needle U-100 (BD INSULIN SYRINGE ULTRAFINE) 31G X 5/16" 0.5 ML MISC Use 3x a day   . losartan (COZAAR) 100 MG tablet Take 1 tablet (100 mg total) by mouth daily.   . Magnesium 250 MG TABS Take 1 tablet by mouth.   . metFORMIN (GLUCOPHAGE-XR) 500 MG 24 hr tablet Take 1 tablet (500 mg total) by mouth 3 (three) times daily with meals.   . Multiple Vitamin (MULTIVITAMIN WITH MINERALS) TABS Take 1 tablet by mouth daily.   . nitroGLYCERIN (NITROSTAT) 0.4 MG SL tablet Place 1 tablet (0.4 mg total) under the tongue every 5 (five) minutes as needed for chest pain. (Patient not taking: Reported on 02/15/2017)  01/13/2017: PRN  . omega-3 acid ethyl esters (LOVAZA) 1 g capsule Take 2 capsules (2 g total) by mouth 2 (two) times daily.   . Potassium 99 MG TABS Take 1 tablet by mouth daily.   Marland Kitchen spironolactone (ALDACTONE) 25 MG tablet TAKE 1/2 TABLET BY MOUTH EVERY DAY    Fall Risk  02/18/2017 09/03/2016 01/23/2016 01/02/2016 05/31/2013  Falls in the past year? No No No No No   Depression screen [facility] 2/9 02/18/2017 02/15/2017 09/03/2016 01/23/2016 01/02/2016  Decreased Interest 0 0 0 0 0  Down, Depressed, Hopeless 0 0 0 0 0  PHQ - 2 Score 0 0 0 0 0   Functional Status Survey: Is the patient deaf or have difficulty hearing?: No Does the patient have difficulty seeing, even when wearing glasses/contacts?: No Does the patient have difficulty concentrating, remembering, or making decisions?: No Does the patient have difficulty walking or climbing stairs?: No Does the patient have difficulty dressing or bathing?: No Does the patient have difficulty doing errands alone such as visiting a doctor's office or shopping?: No   Assessment:  66 year old with diabetes self health management deficits and medication assistance needs.   Medication Management/patient Assistance - [name] was originally referred to our [place] team for medication assistance as he was having difficulty affording his insulin. Surveyor, quantity Pharmacy services [place] [facility], [name], BCACP engaged [name] who qualified for emergency assistance on 01/14/17 and was assisted with Insulin and Losartan. Dr. Cyndia Castro reviewed medications in detail at that time with [name] and his wife and sent an application for completion to [name] for Humulin R U-500 manufacturer assistance. According to [place], [name] continues to report difficulty affording insulin and requests ongoing pharmacy input/assistance. A referral has been made to our pharmacy team.   Diabetes Management - [name] was also referred  to [place] for nursing intervention for diabetes education as his glucose is still poorly controlled.   [name] and his wife were very interested in diabetes education. We spent a great deal of time reviewing his prescribed carb modified diet. His cbg's remain in the 300's-400's. He says he knows that his dietary choices and weight gain have contributed to this. He admits to eating out most evening and says he and his wife have decided that, armed with the information they have from our visit today, they're going to the grocery story and plan to regroup on dietary management.  He is taking Novolin N and R as prescribed but is not getting good control.   [name] is going to document his dietary intake and cbg's in his new blue [facility] Care Management calendar and documentation tool over the next 2weeks and I will revisit him to follow up.  Plan:  [facility] CM Care Plan Problem One     Most Recent Value  Care Plan Problem One  Knowledge Deficits related to Self Health Management of Diabetes  Role Documenting the Problem One  Care Management Coordinator  Care Plan for Problem One  Active  THN Long Term Goal   Over the next 60 days, patient will demonstrate understanding of plan of care for self health managent of diabetes  THN Long Term Goal Start Date  02/18/17  Interventions for Problem One Long Term Goal  Discussed in detail the importance of and rationale for long term plan for self health maintenance of diabetes,  provided EMMI educational tools and resources,  helped patient set short and long term goals  THN CM Short Term Goal #1   Over the next 14 days, patient will record dietary intake   THN CM Short Term Goal #1 Start Date  02/18/17  Interventions for Short Term Goal #1  patient established goal to record dietary intake over next 2 weeks as a way to make clear observation of eating habits  THN CM Short Term Goal #2   over the next 14 days, patient will incorporate 4  servings of vegetables and 1 serving of fruit daily as evidenced by documentation of dietary intake  THN CM Short Term Goal #2 Start Date  02/18/17  Interventions for Short Term Goal #2  assisted patient in establishing goal of incorporating more vegetables and fruit into diet  THN CM Short Term Goal #3  Over the next 30 days, patient will work with pharmcy team to find resources for medication assistance  THN CM Short Term Goal #3 Start Date  02/18/17  Interventions for Short Tern Goal #3  pharmacy referral made,  discussed pharmacy services with aptient      [place] Management  [phone]

## 2017-02-19 ENCOUNTER — Other Ambulatory Visit: Payer: Self-pay | Admitting: Pharmacist

## 2017-02-19 NOTE — Patient Outreach (Signed)
[place]) Care Management  02/19/2017  Randy Castro. 1951/01/04 [id number]  [facility] CM Pharmacist received a second referral regarding patient assistance for patient's Humulin U-500 insulin.    Successful phone outreach [facility] answered call, patient was placed on call and gave verbal permission for [facility] Pharmacist to discuss his pharmacy needs with his wife.  HIPAA details verified.   [facility] Assistant Microbiologist had previously worked with patient to complete application for [employer] patient assistance.   Discussed per Orson Ape, patient's application was denied due to being over-income.  Spouse reports patient is presently using Novolin N and R and has insulin in his possession.   She reports co-insurance in coverage gap will be too much for Humulin U-500.    Discussed with spouse, will follow-up with Dawn next week regarding next steps for patient.    Plan:  Will follow-up with patient/spouse next week.   Karrie Meres, PharmD, [place] [phone]

## 2017-03-02 ENCOUNTER — Other Ambulatory Visit: Payer: Self-pay | Admitting: *Deleted

## 2017-03-02 NOTE — Patient Outreach (Signed)
[place] [place] [facility]) Care Management   03/02/2017  Reis Goga. Apr 03, 1951 [id number]  Choya Tornow. is an 66 y.o. male who lives in [place] with his wife Vaughan Basta. His medical history includes uncontrolled insulin dependent type II   Subjective: "I think I'm going to get there. I've really put my mind to it and my wife and I are going to do this thing together."   Objective:  BP 136/80   Pulse 72   SpO2 98%   Review of Systems  Constitutional: Negative.   HENT: Negative.   Eyes: Negative.   Respiratory: Negative.   Cardiovascular: Negative.   Musculoskeletal: Negative.   Skin: Negative.   Neurological: Negative.   Psychiatric/Behavioral: Negative.     Physical Exam  Constitutional: He is oriented to person, place, and time. Vital signs are normal. He appears well-developed and well-nourished. He is active. He does not have a sickly appearance. He does not appear ill.  Cardiovascular: Normal rate and regular rhythm.  Exam reveals no gallop.   No murmur heard. Respiratory: Effort normal and breath sounds normal. He has no wheezes. He has no rhonchi. He has no rales.  GI: Soft. Bowel sounds are normal.  Neurological: He is oriented to person, place, and time.  Skin: Skin is warm, dry and intact. No rash noted.  Psychiatric: He has a normal mood and affect. His speech is normal and behavior is normal. Judgment and thought content normal. Cognition and memory are normal.    Encounter Medications:   Outpatient Encounter Prescriptions as of 03/02/2017  Medication Sig Note  . acetaminophen (TYLENOL) 500 MG tablet Take 500 mg by mouth every 6 (six) hours as needed.   Marland Kitchen aspirin EC 81 MG tablet Take 81 mg by mouth daily.   Marland Kitchen atorvastatin (LIPITOR) 80 MG tablet Take 1 tablet (80 mg total) by mouth daily.   Marland Kitchen azelastine (ASTELIN) 0.1 % nasal spray Place 1-2 sprays into both nostrils 2 (two) times daily. Use in each nostril as directed   . BD PEN NEEDLE NANO U/F 32G X 4  MM MISC USE FOR INJECTIONS FOUR TIMES DAILY   . carvedilol (COREG) 12.5 MG tablet Take 12.5 mg by mouth daily.   . citalopram (CELEXA) 20 MG tablet Take 1 tablet (20 mg total) by mouth daily.   . clopidogrel (PLAVIX) 75 MG tablet TAKE 1 TABLET BY MOUTH EVERY DAY   . clotrimazole-betamethasone (LOTRISONE) cream Apply topically 2 (two) times daily. (Patient taking differently: Apply topically 2 (two) times daily as needed. ) 02/18/2017: Uses as needed  . famotidine (PEPCID) 20 MG tablet Take 20 mg by mouth 2 (two) times daily as needed for heartburn.   . fenofibrate (TRICOR) 145 MG tablet TAKE 1 TABLET BY MOUTH EVERY DAY   . glucose blood test strip Use 3x a day - for OneTouch Ultra mini   . insulin NPH Human (HUMULIN N,NOVOLIN N) 100 UNIT/ML injection Inject 60 Units into the skin 2 (two) times daily before a meal.   . insulin regular (NOVOLIN R,HUMULIN R) 100 units/mL injection Inject 35 Units into the skin 3 (three) times daily before meals.   . Insulin Syringe-Needle U-100 (BD INSULIN SYRINGE ULTRAFINE) 31G X 5/16" 0.5 ML MISC Use 3x a day   . losartan (COZAAR) 100 MG tablet Take 1 tablet (100 mg total) by mouth daily.   . Magnesium 250 MG TABS Take 1 tablet by mouth.   . metFORMIN (GLUCOPHAGE-XR) 500 MG 24 hr tablet  Take 1 tablet (500 mg total) by mouth 3 (three) times daily with meals.   . Multiple Vitamin (MULTIVITAMIN WITH MINERALS) TABS Take 1 tablet by mouth daily.   . nitroGLYCERIN (NITROSTAT) 0.4 MG SL tablet Place 1 tablet (0.4 mg total) under the tongue every 5 (five) minutes as needed for chest pain. 02/18/2017: On hand; not using; expiration date 04/2018  . omega-3 acid ethyl esters (LOVAZA) 1 g capsule Take 2 capsules (2 g total) by mouth 2 (two) times daily.   . Potassium 99 MG TABS Take 1 tablet by mouth daily.   Marland Kitchen spironolactone (ALDACTONE) 25 MG tablet TAKE 1/2 TABLET BY MOUTH EVERY DAY   . insulin regular human CONCENTRATED (HUMULIN R U-500 KWIKPEN) 500 UNIT/ML kwikpen Inject  under skin 160 units before breakfast and 110 units before dinner (Patient not taking: Reported on 03/02/2017) 02/18/2017: Not taking; cannot afford   Assessment:  66 year old with diabetes self health management deficits and medication assistance needs.   Medication Management/patient Assistance- A referral was originally made to our pharmacy team for assistance with insulin for Mr. Xia. Please see pharmacy note.    Diabetes Management- Mr. Willcox and his wife were very interested in diabetes education again today. They put into place the changes we discussed when we last met regarding his carb modified diet. His cbg's dropped to the 200-300 range from the 300's-400's on our first visit.   Plan: Mr. Stiehl is going to document his dietary intake and cbg's in his [place] [employer] calendar and documentation tool over the next month. He will also add short walks around the apartment complex when the weather permits. I will see Mr. Bayus in one month.    [place] Management  [phone]

## 2017-03-17 ENCOUNTER — Encounter: Payer: Self-pay | Admitting: Cardiology

## 2017-03-17 NOTE — Progress Notes (Signed)
Cardiology Office Note  Date: 03/19/2017   ID: [name], DOB June 25, 1951, MRN [id number]  PCP: Colon Branch, MD  Primary Cardiologist: Rozann Lesches, MD   Chief Complaint  Patient presents with  . Coronary Artery Disease    History of Present Illness: Usbaldo Pannone. is a 66 y.o. male former patient of Dr. Aundra Dubin (last seen July 2017) that I am meeting for the first time in clinic today. He presents today with his wife for a routine follow-up visit. At this time he does not report any significant angina symptoms or nitroglycerin use. He tells me that he has been trying to lose some weight and walk for exercise, trying to get his blood sugars down.  He reports compliance with his medications which are outlined below. Current cardiac regimen includes aspirin, Lipitor, Coreg, Plavix, TriCor, Cozaar and Aldactone. I reviewed his most recent lab work which is outlined below.  He does follow with endocrinology for management of diabetes mellitus. Hemoglobin A1c in May was 9.7.  Last echocardiogram was back in 2014. He does have a history of cardiomyopathy although had subsequent improvement in LVEF on medical therapy.  I personally reviewed his ECG today which shows sinus rhythm with PACs, left anterior fascicular block, nonspecific T-wave changes.  Past Medical History:  Diagnosis Date  . Arthritis   . CAD (coronary artery disease)    a. NSTEMI 12/12: EF 40-45%. DJS:HFWYOVZ balloon PTCA + Promus DES x 1 to mid LAD;  b. 11/2012: Cath with [place]ting balloon PTCA  LAD for ISR to LAD, EF 55%;  c. 12/2012 Cath/PCI: LAD stents patent, 80 ost Diag (jailed)->PTCA, LCX/RCA patent.  . Essential hypertension   . Hyperlipidemia   . Ischemic cardiomyopathy    a.  echo 08/06/11: dist ant wall, apical and septal and infero-apical HK, mild LVH, EF 40%, mild LAE, PASP 34, asc aorta mildly dilated, mild TR;  b.  Echo (3/13) showed recovery of LV systolic function with EF 85-88%, grade I diastolic  dysfunction, mild MR.   . Type 2 diabetes mellitus ([place])     Past Surgical History:  Procedure Laterality Date  . ANTERIOR CERVICAL DECOMP/DISCECTOMY FUSION  in the 90s  . CORONARY ANGIOPLASTY  12/15/2012; 12/22/2012  . CORONARY ANGIOPLASTY WITH STENT PLACEMENT  07/2011   "1" (12/22/2012)  . KNEE ARTHROSCOPY Left 12/31/2016  . LEFT HEART CATHETERIZATION WITH CORONARY ANGIOGRAM N/A 08/06/2011   Procedure: LEFT HEART CATHETERIZATION WITH CORONARY ANGIOGRAM;  Surgeon: Burnell Blanks, MD;  Location[facility] LAB;  Service: Cardiovascular;  Laterality: N/A;  . LEFT HEART CATHETERIZATION WITH CORONARY ANGIOGRAM N/A 12/15/2012   Procedure: LEFT HEART CATHETERIZATION WITH CORONARY ANGIOGRAM;  Surgeon: Sherren Mocha, MD;  Location: [facility] CATH LAB;  Service: Cardiovascular;  Laterality: N/A;  . LEFT HEART CATHETERIZATION WITH CORONARY ANGIOGRAM N/A 12/22/2012   Procedure: LEFT HEART CATHETERIZATION WITH CORONARY ANGIOGRAM;  Surgeon: Sherren Mocha, MD;  Location: [facility] CATH LAB;  Service: Cardiovascular;  Laterality: N/A;  . PERCUTANEOUS CORONARY STENT INTERVENTION (PCI-S) Right 12/15/2012   Procedure: PERCUTANEOUS CORONARY STENT INTERVENTION (PCI-S);  Surgeon: Sherren Mocha, MD;  Location: [facility] CATH LAB;  Service: Cardiovascular;  Laterality: Right;    Current Outpatient Prescriptions  Medication Sig Dispense Refill  . acetaminophen (TYLENOL) 500 MG tablet Take 500 mg by mouth every 6 (six) hours as needed.    Marland Kitchen aspirin EC 81 MG tablet Take 81 mg by mouth daily.    Marland Kitchen atorvastatin (LIPITOR) 80 MG tablet Take 1  tablet (80 mg total) by mouth daily. 90 tablet 3  . azelastine (ASTELIN) 0.1 % nasal spray Place 1-2 sprays into both nostrils 2 (two) times daily. Use in each nostril as directed 30 mL 5  . BD PEN NEEDLE NANO U/F 32G X 4 MM MISC USE FOR INJECTIONS FOUR TIMES DAILY 300 each 1  . carvedilol (COREG) 12.5 MG tablet Take 12.5 mg by mouth daily.    . citalopram (CELEXA) 20 MG tablet Take 1 tablet (20 mg  total) by mouth daily. 90 tablet 1  . clopidogrel (PLAVIX) 75 MG tablet TAKE 1 TABLET BY MOUTH EVERY DAY 90 tablet 3  . clotrimazole-betamethasone (LOTRISONE) cream Apply topically 2 (two) times daily. (Patient taking differently: Apply topically 2 (two) times daily as needed. ) 30 g 0  . famotidine (PEPCID) 20 MG tablet Take 20 mg by mouth 2 (two) times daily as needed for heartburn.    . fenofibrate (TRICOR) 145 MG tablet TAKE 1 TABLET BY MOUTH EVERY DAY 30 tablet 11  . glucose blood test strip Use 3x a day - for OneTouch Ultra mini 200 each 11  . insulin NPH Human (HUMULIN N,NOVOLIN N) 100 UNIT/ML injection Inject 60 Units into the skin 2 (two) times daily before a meal.    . insulin regular (NOVOLIN R,HUMULIN R) 100 units/mL injection Inject 35 Units into the skin 3 (three) times daily before meals.    . insulin regular human CONCENTRATED (HUMULIN R U-500 KWIKPEN) 500 UNIT/ML kwikpen Inject under skin 160 units before breakfast and 110 units before dinner 10 pen 5  . Insulin Syringe-Needle U-100 (BD INSULIN SYRINGE ULTRAFINE) 31G X 5/16" 0.5 ML MISC Use 3x a day 100 each 5  . losartan (COZAAR) 100 MG tablet Take 1 tablet (100 mg total) by mouth daily. 90 tablet 0  . Magnesium 250 MG TABS Take 1 tablet by mouth.    . metFORMIN (GLUCOPHAGE-XR) 500 MG 24 hr tablet Take 1 tablet (500 mg total) by mouth 3 (three) times daily with meals. 270 tablet 3  . Multiple Vitamin (MULTIVITAMIN WITH MINERALS) TABS Take 1 tablet by mouth daily.    . nitroGLYCERIN (NITROSTAT) 0.4 MG SL tablet Place 1 tablet (0.4 mg total) under the tongue every 5 (five) minutes as needed for chest pain. 100 tablet 3  . omega-3 acid ethyl esters (LOVAZA) 1 g capsule Take 2 capsules (2 g total) by mouth 2 (two) times daily. 120 capsule 11  . Potassium 99 MG TABS Take 1 tablet by mouth daily.    Marland Kitchen spironolactone (ALDACTONE) 25 MG tablet TAKE 1/2 TABLET BY MOUTH EVERY DAY 45 tablet 3   No current facility-administered medications  for this visit.    Allergies:  Trulicity [dulaglutide]   Social History: The patient  reports that he quit smoking about 24 years ago. His smoking use included Cigarettes. He has a 25.00 pack-year smoking history. He has never used smokeless tobacco. He reports that he drinks alcohol. He reports that he does not use drugs.   Family History: The patient's family history includes Cancer in his brother; Coronary artery disease in his father; Diabetes in his brother, mother, and sister; Heart attack (age of onset: 55) in his father.   ROS:  Please see the history of present illness. Otherwise, complete review of systems is positive for chronic intermittent left knee pain.  All other systems are reviewed and negative.   Physical Exam: VS:  BP 120/74   Pulse 76   Ht 6'  2" (1.88 m)   Wt 289 lb (131.1 kg)   SpO2 95%   BMI 37.11 kg/m , BMI Body mass index is 37.11 kg/m.  Wt Readings from Last 3 Encounters:  03/19/17 289 lb (131.1 kg)  02/18/17 (!) 305 lb (138.3 kg)  01/21/17 (!) 305 lb (138.3 kg)    General: Morbidly obese male, appears comfortable at rest. HEENT: Conjunctiva and lids normal, oropharynx clear. Neck: Supple, no elevated JVP or carotid bruits, no thyromegaly. Lungs: Clear to auscultation, nonlabored breathing at rest. Cardiac: Regular rate and rhythm, no S3 or significant systolic murmur, no pericardial rub. Abdomen: Obese, nontender, bowel sounds present, no guarding or rebound. Extremities: No pitting edema, distal pulses 2+. Skin: Warm and dry. Musculoskeletal: No kyphosis. Neuropsychiatric: Alert and oriented x3, affect grossly appropriate.  ECG: I personally reviewed the tracing from 03/16/2016 which showed sinus rhythm with LVH, occasional PVCs, nonspecific T-wave changes.  Recent Labwork: 08/03/2016: Hemoglobin 14.2; Platelets 283.0 01/13/2017: ALT 26; AST 19 01/27/2017: BUN 13; Creatinine, Ser 0.78; Potassium 4.1; Sodium 138     Component Value Date/Time   CHOL  143 05/18/2016 0828   TRIG 343 (H) 05/18/2016 0828   HDL 28 (L) 05/18/2016 0828   CHOLHDL 5.1 (H) 05/18/2016 0828   VLDL 69 (H) 05/18/2016 0828   LDLCALC 46 05/18/2016 0828   LDLDIRECT 64.0 01/02/2016 1106    Other Studies Reviewed Today:  Echocardiogram 04/14/2013: Study Conclusions  - Left ventricle: The cavity size was at the upper limits of normal. Wall thickness was increased in a pattern of moderate LVH. The estimated ejection fraction was 60%. Wall motion was normal; there were no regional wall motion abnormalities. - Aorta: Aaortic root mildly dilated (52mm). Ascending aorta not dilated. - Left atrium: The atrium was moderately dilated. - Right atrium: The atrium was mildly dilated.  Cardiac catheterization with PCI 12/22/2012: PROCEDURAL FINDINGS Hemodynamics: AO 127/67              Coronary angiography: Coronary dominance: right  Left mainstem: Widely patent with no obstructive disease.  Left anterior descending (LAD): The LAD is widely patent throughout. The previously treated stent in the proximal LAD is widely patent with no significant in-stent restenosis. There is a large bifurcating diagonal arising from the stented segment with 80-90% ostial stenosis.  Left circumflex (LCx): The left circumflex is patent. The obtuse marginals and left posterolateral branch as well as left PDA are all widely patent. There is diffuse nonobstructive disease present.  Right coronary artery (RCA): Not selectively imaged  Left ventriculography: Deferred  PCI Note:  Following the diagnostic procedure, the decision was made to proceed with PCI. Heparin was used for anticoagulation. A total of 10,000 units was given.  The patient has been on long-term Plavix and his P2 Y. 12 was recently checked and found to be therapeutic.  A pro-water coronary guidewire was used to cross the lesion.  The lesion was predilated with a 2.0 x 12 mm balloon.  The lesion was then treated  with a 2.5 x 10 mm angioscoped balloon to 12 atmospheres. A total of 2 inflations was done. There was 30% residual stenosis. I felt this was the best result we could obtain and there was TIMI-3 flow present.  Final angiography confirmed an excellent result. The patient tolerated the procedure well. There were no immediate procedural complications. A TR band was used for radial hemostasis. The patient was transferred to the post catheterization recovery area for further monitoring.  PCI Data: Vessel - first  diagonal/Segment - ostial Percent Stenosis (pre)  80 TIMI-flow 3 Stent none Percent Stenosis (post) 30 TIMI-flow (post) 3  Final Conclusions:   1. Single-vessel coronary artery disease with continued patency of the stented segment in the proximal LAD but severe ostial stenosis of the first diagonal  2. Minor nonobstructive left circumflex stenosis 3. Successful balloon angioplasty of the first diagonal branch  Assessment and Plan:  1. CAD status post prior percutaneous interventions to the LAD and diagonal, most recently in 2014 as detailed above. He reports no active angina symptoms this time on medical therapy. States that he has a fresh bottle of nitroglycerin. I reviewed his ECG. No changes made present regimen.  2. History of cardiomyopathy with subsequent improvement in LVEF on medical therapy, last assessed in 2014. Follow-up echocardiogram will be obtained.  3. Uncontrolled type 2 diabetes mellitus. He is following with endocrinology. States that he is trying to lose weight and exercise with goal of improving his blood sugars. Reports compliance with medications. Last hemoglobin A1c was 9.7.  4. Mixed hyperlipidemia, currently on Lipitor and TriCor. LDL was 64 most recently.  Current medicines were reviewed with the patient today.   Orders Placed This Encounter  Procedures  . EKG 12-Lead  . ECHOCARDIOGRAM COMPLETE    Disposition: Follow-up in one year, sooner if  needed.  Signed, Satira Sark, MD, [facility] 03/19/2017 11:50 AM    [place] [facility] [place]. [address] [place], [place] [address] Phone: [phone]; Fax: [phone]

## 2017-03-19 ENCOUNTER — Encounter: Payer: Self-pay | Admitting: Cardiology

## 2017-03-19 ENCOUNTER — Ambulatory Visit (INDEPENDENT_AMBULATORY_CARE_PROVIDER_SITE_OTHER): Payer: PPO | Admitting: Cardiology

## 2017-03-19 VITALS — BP 120/74 | HR 76 | Ht 74.0 in | Wt 289.0 lb

## 2017-03-19 DIAGNOSIS — Z8679 Personal history of other diseases of the circulatory system: Secondary | ICD-10-CM

## 2017-03-19 DIAGNOSIS — E1159 Type 2 diabetes mellitus with other circulatory complications: Secondary | ICD-10-CM | POA: Diagnosis not present

## 2017-03-19 DIAGNOSIS — E1165 Type 2 diabetes mellitus with hyperglycemia: Secondary | ICD-10-CM | POA: Diagnosis not present

## 2017-03-19 DIAGNOSIS — E782 Mixed hyperlipidemia: Secondary | ICD-10-CM

## 2017-03-19 DIAGNOSIS — I2510 Atherosclerotic heart disease of native coronary artery without angina pectoris: Secondary | ICD-10-CM

## 2017-03-19 NOTE — Patient Instructions (Signed)
Your physician wants you to follow-up in:  1 year with Dr Ferne Reus will receive a reminder letter in the mail two months in advance. If you don't receive a letter, please call our office to schedule the follow-up appointment.    Your physician has requested that you have an echocardiogram. Echocardiography is a painless test that uses sound waves to create images of your heart. It provides your doctor with information about the size and shape of your heart and how well your heart's chambers and valves are working. This procedure takes approximately one hour. There are no restrictions for this procedure.     Your physician recommends that you continue on your current medications as directed. Please refer to the Current Medication list given to you today.   If you need a refill on your cardiac medications before your next appointment, please call your pharmacy.     No lab work ordered today.       Thank you for choosing [facility] !

## 2017-03-22 ENCOUNTER — Ambulatory Visit (HOSPITAL_COMMUNITY)
Admission: RE | Admit: 2017-03-22 | Discharge: 2017-03-22 | Disposition: A | Discharge status: Home / Self Care | Payer: PPO | Source: Ambulatory Visit | Attending: Cardiology | Admitting: Cardiology

## 2017-03-22 ENCOUNTER — Other Ambulatory Visit (HOSPITAL_COMMUNITY): Payer: PPO

## 2017-03-22 ENCOUNTER — Other Ambulatory Visit: Payer: Self-pay | Admitting: Pharmacist

## 2017-03-22 DIAGNOSIS — I420 Dilated cardiomyopathy: Secondary | ICD-10-CM | POA: Diagnosis not present

## 2017-03-22 DIAGNOSIS — I2510 Atherosclerotic heart disease of native coronary artery without angina pectoris: Secondary | ICD-10-CM

## 2017-03-22 DIAGNOSIS — I5030 Unspecified diastolic (congestive) heart failure: Secondary | ICD-10-CM | POA: Insufficient documentation

## 2017-03-22 LAB — ECHOCARDIOGRAM COMPLETE
E decel time: 373 msec
E/e' ratio: 7.28
FS: 37 % (ref 28–44)
IVS/LV PW RATIO, ED: 0.92
LA ID, A-P, ES: 43 mm
LA diam end sys: 43 mm
LA diam index: 1.62 cm/m2
LA vol A4C: 63.4 ml
LA vol index: 24.7 mL/m2
LA vol: 65.7 mL
LV E/e' medial: 7.28
LV E/e'average: 7.28
LV PW d: 12 mm — AB (ref 0.6–1.1)
LV dias vol index: 35 mL/m2
LV dias vol: 94 mL (ref 62–150)
LV e' LATERAL: 7.72 cm/s
LV sys vol index: 15 mL/m2
LV sys vol: 40 mL (ref 21–61)
LVOT SV: 85 mL
LVOT VTI: 18.9 cm
LVOT area: 4.52 cm2
LVOT diameter: 24 mm
LVOT peak grad rest: 3 mmHg
LVOT peak vel: 91.4 cm/s
Lateral S' vel: 9.9 cm/s
MV Dec: 373
MV pk A vel: 93.2 m/s
MV pk E vel: 56.2 m/s
Simpson's disk: 57
Stroke v: 54 ml
TAPSE: 16.9 mm
TDI e' lateral: 7.72
TDI e' medial: 6.31

## 2017-03-22 NOTE — Progress Notes (Signed)
*  PRELIMINARY RESULTS* Echocardiogram 2D Echocardiogram has been performed.  Randy Castro 03/22/2017, 3:57 PM

## 2017-03-22 NOTE — Patient Outreach (Signed)
[facility]) Care Management  03/22/2017  Randy Castro. [birth year] [id number]  Attempted to reach patient via phone to follow-up patient assistance concerns regarding insulin.    Phone in chart gave recording "number is not reachable."  Plan:  Will continue to attempt to reach patient.   Karrie Meres, PharmD, [place] [phone]

## 2017-03-24 ENCOUNTER — Encounter: Payer: Self-pay | Admitting: Pharmacist

## 2017-03-24 ENCOUNTER — Other Ambulatory Visit: Payer: Self-pay | Admitting: Pharmacist

## 2017-03-24 NOTE — Patient Outreach (Signed)
[facility] [facility]) Care Management  03/24/2017  Yahia Bottger. May 01, 1951 [id number]  Second outreach attempt to patient---HIPAA details verified with patient.  Patient reports he is continuing to use Novolin N and R insulin from [employer].  Discussed with patient message was sent to Dr Cruzita Lederer regarding his denial for patient assistance for Humulin U-500 and if appropriate with MD, patient could apply to [name] to see if he qualifies for assistance with [place].    Dr Cruzita Lederer is willing to try to help patient apply for assistance with [place].  Patient requested [facility] Pharmacist speak with his spouse.  Explained to spouse Dr Cruzita Lederer is willing to try [place] [facility] if patient is able to get patient assistance.  Discussed [name] Patient Assistance program requirements, including requirement to spend at least $1,000 on prescriptions in calendar year----spouse reports she will get pharmacy statements to see what patient has spent year to date.  Discussed patient's household income does exceed posted requirements so he will have to try to appeal decision---spouse understands there is no guarantee patient will be eligible for manufacturer assistance.    They would like to apply.   Plan:  Will [employer] Patient Assistance application to application---address in chart verified.   Will follow-up via phone in the next 7-10 days regarding application.   Karrie Meres, PharmD, [place] [phone]

## 2017-03-29 LAB — HM DIABETES EYE EXAM

## 2017-03-30 ENCOUNTER — Ambulatory Visit: Payer: Self-pay | Admitting: *Deleted

## 2017-04-01 ENCOUNTER — Other Ambulatory Visit: Payer: Self-pay | Admitting: Pharmacist

## 2017-04-01 NOTE — Patient Outreach (Signed)
[facility]) Care Management  04/01/2017  Randy Castro. 1951-04-01 [id number]  Successful phone outreach to patient's spouse whom he has preferred be spoken with in the past. Patient's spouse reports she completed patient portion of Novo Nordisk application and placed in mail to [facility] yesterday.  She reports she believes patient has spent $1,000 on prescriptions this calendar year.   Faxed Dr Arman Filter office prescriber portion of application today.   Plan:  Once completed application is received, will fax to [name] to have eligibility for patient assistance evaluated.   Karrie Meres, PharmD, [place] [phone]

## 2017-04-02 ENCOUNTER — Telehealth: Payer: Self-pay

## 2017-04-02 ENCOUNTER — Other Ambulatory Visit: Payer: Self-pay | Admitting: Internal Medicine

## 2017-04-02 MED ORDER — INSULIN ASPART 100 UNIT/ML ~~LOC~~ SOLN: 45.0000 [IU] | Freq: Three times a day (TID) | SUBCUTANEOUS | 3 refills | Status: DC

## 2017-04-02 MED ORDER — INSULIN DEGLUDEC 200 UNIT/ML ~~LOC~~ SOPN: 60.0000 [IU] | PEN_INJECTOR | Freq: Two times a day (BID) | SUBCUTANEOUS | 3 refills | Status: DC

## 2017-04-02 NOTE — Telephone Encounter (Signed)
Spoke with patient's wife, Patient is currently taking: NOVOLIN R 45 units before breakfast and 45 units before lunch,40 units before dinner. NOVOLIN N 60 units before breakfast and 40 units before lunch. Metformin ER 500MG  TID Patient checks glucose 2-3 times a day.  Recent glucose readings are: 03/28/17 454 before breakfast, 301 before lunch 03/29/17 375 before dinner 03/30/17 424 before breakfast 03/31/17 380 before breakfast, 367 before lunch 04/01/17 356 beofre breakfast.

## 2017-04-02 NOTE — Telephone Encounter (Signed)
Thank you :)

## 2017-04-05 ENCOUNTER — Other Ambulatory Visit: Payer: Self-pay | Admitting: *Deleted

## 2017-04-05 NOTE — Patient Outreach (Signed)
[facility]) Care Management  04/05/2017  Kyri Dai. 1951-07-30 [id number]  I reached out to Mr. Hollenbeck today to follow up on his diabetes management and needs. He reports that he is still working on adherence to his prescribed carb modified diet and while his cbg's have improved he has "stalled" in the 300's. Isabell Jarvis, [facility] Pharmacist has been working with Mr. Renwick and Dr. Cruzita Lederer re: Novo Nordisk patient application. Mr. Levitz completed his portion with the assistance of Dr. Genia Hotter. Mr. Billy reports that he received a call from Dr. Arman Filter office on Friday requesting detailed information about his cbg's and insulin dose and understood that she was completing her portion of the form as requested.   Plan: I have scheduled a routine home visit with Mr. Bissonnette for Wednesday @ 11:15am. I have notified Dr. Genia Hotter of the updated information I have re: the patient assistance application.    [place] Management  [phone]

## 2017-04-06 ENCOUNTER — Other Ambulatory Visit: Payer: Self-pay | Admitting: Pharmacist

## 2017-04-06 NOTE — Patient Outreach (Signed)
[facility] Care Management  04/06/2017  Randy Castro. [birth year] [id number]  Received patient's application for [name] patient assistance program.  Received prescriber portion of application.   Application was faxed to [name] patient assistance to have eligibility evaluated.   Plan:  Will follow-up as scheduled.   Will route note to [place].   Karrie Meres, PharmD, [place] [phone]

## 2017-04-07 ENCOUNTER — Other Ambulatory Visit: Payer: Self-pay | Admitting: *Deleted

## 2017-04-07 NOTE — Patient Outreach (Signed)
Randy Castro [facility]) Care Management   04/07/2017  Randy Castro. 01-May-1951 [id number]  Randy Castro. is an 66 y.o. gentleman who lives in [place] with [name]. His medical history includes Uncontrolled Insulin DependentType II Diabetes Mellitus (diagnosed 1998), hypertension, hyperlipidemia, CAD s/p MI in 2012, s/p PTCA x 2, ischemic cardiomyopathy, degenerative joint disease with left knee arthroscopy and partial medial meniscectomy on May 1 of this year.   Subjective: "I think I'm getting there, step by step"  Objective:  BP 124/72   Pulse 67   Wt 288 lb (130.6 kg)   SpO2 97%   BMI 36.98 kg/m   Review of Systems  Constitutional: Negative.   HENT: Negative.   Eyes: Negative.   Respiratory: Negative.   Cardiovascular: Negative.   Gastrointestinal: Negative.   Genitourinary: Negative.   Musculoskeletal: Negative.   Skin: Negative.   Neurological: Negative.   Psychiatric/Behavioral: Negative.     Physical Exam  Constitutional: He is oriented to person, place, and time. Vital signs are normal. He appears well-developed and well-nourished. He is active. He does not have a sickly appearance. He does not appear ill.  Cardiovascular: Normal rate and regular rhythm.   Respiratory: Effort normal. He has no wheezes. He has no rhonchi. He has no rales.  GI: Soft. Bowel sounds are normal.  Neurological: He is alert and oriented to person, place, and time.  Skin: Skin is warm, dry and intact.  Psychiatric: He has a normal mood and affect. His speech is normal and behavior is normal. Judgment and thought content normal. Cognition and memory are normal.    Encounter Medications:   Outpatient Encounter Prescriptions as of 04/07/2017  Medication Sig Note  . acetaminophen (TYLENOL) 500 MG tablet Take 500 mg by mouth every 6 (six) hours as needed.   Marland Kitchen aspirin EC 81 MG tablet Take 81 mg by mouth daily.   Marland Kitchen atorvastatin (LIPITOR) 80 MG tablet Take 1 tablet (80 mg  total) by mouth daily.   Marland Kitchen azelastine (ASTELIN) 0.1 % nasal spray Place 1-2 sprays into both nostrils 2 (two) times daily. Use in each nostril as directed   . BD PEN NEEDLE NANO U/F 32G X 4 MM MISC USE FOR INJECTIONS FOUR TIMES DAILY   . carvedilol (COREG) 12.5 MG tablet Take 12.5 mg by mouth daily.   . citalopram (CELEXA) 20 MG tablet Take 1 tablet (20 mg total) by mouth daily.   . clopidogrel (PLAVIX) 75 MG tablet TAKE 1 TABLET BY MOUTH EVERY DAY   . clotrimazole-betamethasone (LOTRISONE) cream Apply topically 2 (two) times daily. (Patient taking differently: Apply topically 2 (two) times daily as needed. ) 02/18/2017: Uses as needed  . famotidine (PEPCID) 20 MG tablet Take 20 mg by mouth 2 (two) times daily as needed for heartburn.   . fenofibrate (TRICOR) 145 MG tablet TAKE 1 TABLET BY MOUTH EVERY DAY   . glucose blood test strip Use 3x a day - for OneTouch Ultra mini   . insulin aspart (NOVOLOG) 100 UNIT/ML injection Inject 45 Units into the skin 3 (three) times daily before meals.   . Insulin Degludec (TRESIBA FLEXTOUCH) 200 UNIT/ML SOPN Inject 60 Units into the skin 2 (two) times daily.   . Insulin Syringe-Needle U-100 (BD INSULIN SYRINGE ULTRAFINE) 31G X 5/16" 0.5 ML MISC Use 3x a day   . losartan (COZAAR) 100 MG tablet Take 1 tablet (100 mg total) by mouth daily.   . Magnesium 250 MG TABS Take  1 tablet by mouth.   . metFORMIN (GLUCOPHAGE-XR) 500 MG 24 hr tablet Take 1 tablet (500 mg total) by mouth 3 (three) times daily with meals.   . Multiple Vitamin (MULTIVITAMIN WITH MINERALS) TABS Take 1 tablet by mouth daily.   . nitroGLYCERIN (NITROSTAT) 0.4 MG SL tablet Place 1 tablet (0.4 mg total) under the tongue every 5 (five) minutes as needed for chest pain. 02/18/2017: On hand; not using; expiration date 04/2018  . omega-3 acid ethyl esters (LOVAZA) 1 g capsule Take 2 capsules (2 g total) by mouth 2 (two) times daily.   . Potassium 99 MG TABS Take 1 tablet by mouth daily.   Marland Kitchen spironolactone  (ALDACTONE) 25 MG tablet TAKE 1/2 TABLET BY MOUTH EVERY DAY    Assessment:  66 year old with diabetes self health management deficits and medication assistance needs.  Medication Management/patient Assistance- A referral was originally made to our pharmacy team for assistance with insulin for [name]. Please see pharmacy note.    Diabetes Management- [name] and his wife were very interested in diabetes education again today. They are working on gathering several vegetable based recipes they can use as a daily part of their nutrition routine. They put into place the changes we discussed when we last met regarding his carb modified diet. [name] cbg's remain in the 300 range but are decreased from the 500's when we first met 2 months ago. [name] has lost approximately 30 pounds in the last 2 months mostly by making dietary changes. He is to see the endocrinologist again in October.   Health Maintenance - [name] has a primary care appointment on 04/28/17.    Plan: [name] is going to document his dietary intake and cbg's in his [place] [employer] calendar and documentation tool over the next month. He will also add short walks around the apartment complex when the weather permits. I will see [name] in one month.    [place] [facility]  [phone]

## 2017-04-09 ENCOUNTER — Other Ambulatory Visit: Payer: Self-pay | Admitting: Pharmacist

## 2017-04-09 NOTE — Patient Outreach (Signed)
[place] [facility]) Care Management  04/09/2017  Randy Castro. Aug 13, 1951 [id number]  Outreach call to patient's spouse whom patient has requested [facility] Pharmacist speak with regarding his patient assistance application.    Placed conference call to [name] patient assistance---representative reports they need the full print-out of prescription drug costs from [facility] faxed, and to call back once faxed.   Plan:  Faxed full prescription spend print to [name] patient assistance.   Will follow-up again next week.   Karrie Meres, PharmD, [place] [phone]

## 2017-04-12 ENCOUNTER — Other Ambulatory Visit: Payer: Self-pay | Admitting: Pharmacist

## 2017-04-12 NOTE — Patient Outreach (Signed)
[place] [facility]) Care Management  04/12/2017  Randy Castro. Aug 31, 1950 [id number]  Follow-up call to patient/spouse regarding his patient assistance application for [name].    Call placed to [name] patient assistance with patient's spouse---representative reports pharmacy spend print-out from [place] is not valid due to it listing "price" and not "patient-paid amount."    Plan:  Will fax EOB from patient's insurance to see if [name] will accept it.   Continue to follow-up patient assistance application.   Karrie Meres, PharmD, [place] [phone]

## 2017-04-13 ENCOUNTER — Other Ambulatory Visit: Payer: Self-pay | Admitting: Pharmacist

## 2017-04-13 NOTE — Patient Outreach (Signed)
[place] [facility]) Care Management  04/13/2017  Randy Castro. 07-Jan-1951 [id number]  Follow-up call to patient's spouse, whom he has requested be spoken with regarding his patient assistance application for Novo Nordisk---she is listed on his application as authorized individual.  Patient's spouse reports they have not heard from [name] yet.  Advised [employer] EOB and [employer] out-of-pocket spend were faxed to [name] patient assistance yesterday.   Placed call with patient's spouse to [name] patient assistance---representative reports it takes 24-48 hours for faxes to be uploaded and to call back tomorrow as they have no update yet.   Plan:  Will continue to follow-up with patient/spouse regarding patient assistance application.   Karrie Meres, PharmD, [place] [phone]

## 2017-04-14 ENCOUNTER — Other Ambulatory Visit: Payer: Self-pay | Admitting: Pharmacist

## 2017-04-14 ENCOUNTER — Other Ambulatory Visit: Payer: Self-pay | Admitting: *Deleted

## 2017-04-14 NOTE — Patient Outreach (Signed)
[place] [facility]) Care Management  04/14/2017  Randy Castro. 02-28-1951 [id number]  Notification received from Isabell Jarvis, PharmD ([place] [facility]) re: medication assistance for [place] and Novolog.  Dr. Genia Hotter notified [name] spouse that [name] patient assistance representative reported patient's application approval and that 8 boxes of Tresiba and 17 vials of Novolog will be shipped to Dr Arman Filter office for patient. Dr. Genia Hotter explained that approval for assistance will only be valid through 07/23/17 and he will have to re-apply in 2019.   Plan: Dr. Genia Hotter indicated plans to call [name] next week to ensure that he receives his medication. I have a scheduled appointment with [name] on 05/11/17.    [place] Management  [phone]

## 2017-04-14 NOTE — Patient Outreach (Signed)
[place] [facility]) Care Management  04/14/2017  Darwyn Ponzo. 08-Jan-1951 [id number]  Successful phone outreach to patient's spouse.  Placed call with patient's spouse to [name] patient assistance program.    [name] patient assistance representative reports patient's application was approved through 07/23/17.  Representative reports 8 boxes of Tresiba and 17 vials of Novolog will be shipped to Dr Arman Filter office for patient.   Disconnected call with [name] patient assistance.  Discussed with patient's spouse [facility] Pharmacist will notify Dr Cruzita Lederer of patient's approval and to watch for shipment of his medication.  Discussed patient's approval only valid through 07/23/17 and he will have to re-apply in 2019.    Plan:  Sent in-basket message to Dr Cruzita Lederer.   Will place follow-up call to patient/spouse next week to see if they received his medication.   Will update [facility] RN [name].   Karrie Meres, PharmD, [place] [phone]

## 2017-04-15 ENCOUNTER — Other Ambulatory Visit: Payer: Self-pay | Admitting: Internal Medicine

## 2017-04-21 ENCOUNTER — Encounter: Payer: Self-pay | Admitting: Pharmacist

## 2017-04-21 NOTE — Progress Notes (Signed)
Subjective:   Randy Castro. is a 66 y.o. male who presents for an Initial Medicare Annual Wellness Visit.  Review of Systems  Cardiac Risk Factors include: advanced age (>79men, >36 women);diabetes mellitus;dyslipidemia;hypertension;male gender;obesity (BMI >30kg/m2);sedentary lifestyle No ROS.  Medicare Wellness Visit. Additional risk factors are reflected in the social history.  Sleep patterns:  Sleeps at least 8 hrs and naps during the day. Home Safety/Smoke Alarms: Feels safe in home. Smoke alarms in place.  Living environment; residence and Firearm Safety: Lives with wife in 1 story apt. Seat Belt Safety/Bike Helmet: Wears seat belt.  Male:   CCS-   Cologuard negative on 01/25/17  PSA-  Lab Results  Component Value Date   PSA 0.35 12/05/2014   PSA 0.44 05/31/2013   PSA 0.66 10/28/2011      Objective:    Today's Vitals   04/27/17 0927  BP: (!) 142/64  Pulse: 60  SpO2: 95%  Weight: 296 lb (134.3 kg)  Height: 6\' 2"  (1.88 m)   Body mass index is 38 kg/m.  Current Medications (verified) Outpatient Encounter Prescriptions as of 04/27/2017  Medication Sig  . acetaminophen (TYLENOL) 500 MG tablet Take 500 mg by mouth every 6 (six) hours as needed.  Marland Kitchen aspirin EC 81 MG tablet Take 81 mg by mouth daily.  Marland Kitchen atorvastatin (LIPITOR) 80 MG tablet Take 1 tablet (80 mg total) by mouth daily.  Marland Kitchen azelastine (ASTELIN) 0.1 % nasal spray Place 1-2 sprays into both nostrils 2 (two) times daily. Use in each nostril as directed  . carvedilol (COREG) 12.5 MG tablet Take 12.5 mg by mouth daily.  . citalopram (CELEXA) 20 MG tablet Take 1 tablet (20 mg total) by mouth daily.  . clopidogrel (PLAVIX) 75 MG tablet TAKE 1 TABLET BY MOUTH EVERY DAY  . clotrimazole-betamethasone (LOTRISONE) cream Apply topically 2 (two) times daily. (Patient taking differently: Apply topically 2 (two) times daily as needed. )  . famotidine (PEPCID) 20 MG tablet Take 20 mg by mouth 2 (two) times daily as needed  for heartburn.  . fenofibrate (TRICOR) 145 MG tablet TAKE 1 TABLET BY MOUTH EVERY DAY  . glucose blood test strip Use 3x a day - for OneTouch Ultra mini  . insulin aspart (NOVOLOG) 100 UNIT/ML injection Inject 45 Units into the skin 3 (three) times daily before meals.  . Insulin Degludec (TRESIBA FLEXTOUCH) 200 UNIT/ML SOPN Inject 60 Units into the skin 2 (two) times daily.  . Insulin Syringe-Needle U-100 (BD INSULIN SYRINGE ULTRAFINE) 31G X 5/16" 0.5 ML MISC Use 3x a day  . losartan (COZAAR) 100 MG tablet Take 1 tablet (100 mg total) by mouth daily.  . Magnesium 250 MG TABS Take 1 tablet by mouth.  . metFORMIN (GLUCOPHAGE-XR) 500 MG 24 hr tablet Take 1 tablet (500 mg total) by mouth 3 (three) times daily with meals.  . Multiple Vitamin (MULTIVITAMIN WITH MINERALS) TABS Take 1 tablet by mouth daily.  . nitroGLYCERIN (NITROSTAT) 0.4 MG SL tablet Place 1 tablet (0.4 mg total) under the tongue every 5 (five) minutes as needed for chest pain.  Marland Kitchen omega-3 acid ethyl esters (LOVAZA) 1 g capsule Take 2 capsules (2 g total) by mouth 2 (two) times daily.  . Potassium 99 MG TABS Take 1 tablet by mouth daily.  Marland Kitchen spironolactone (ALDACTONE) 25 MG tablet TAKE 1/2 TABLET BY MOUTH EVERY DAY  . BD PEN NEEDLE NANO U/F 32G X 4 MM MISC USE FOR INJECTIONS FOUR TIMES DAILY (Patient not taking: Reported  on 04/27/2017)   No facility-administered encounter medications on file as of 04/27/2017.     Allergies (verified) Trulicity [dulaglutide]   History: Past Medical History:  Diagnosis Date  . Arthritis   . CAD (coronary artery disease)    a. NSTEMI 12/12: EF 40-45%. ZRA:[id number] balloon PTCA + Promus DES x 1 to mid LAD;  b. 11/2012: Cath with [place] balloon PTCA  LAD for ISR to LAD, EF 55%;  c. 12/2012 Cath/PCI: LAD stents patent, 80 ost Diag (jailed)->PTCA, LCX/RCA patent.  . Essential hypertension   . Hyperlipidemia   . Ischemic cardiomyopathy    a.  echo 08/06/11: dist ant wall, apical and septal and  infero-apical HK, mild LVH, EF 40%, mild LAE, PASP 34, asc aorta mildly dilated, mild TR;  b.  Echo (3/13) showed recovery of LV systolic function with EF 33-54%, grade I diastolic dysfunction, mild MR.   . Type 2 diabetes mellitus ([place])    Past Surgical History:  Procedure Laterality Date  . ANTERIOR CERVICAL DECOMP/DISCECTOMY FUSION  in the 90s  . CORONARY ANGIOPLASTY  12/15/2012; 12/22/2012  . CORONARY ANGIOPLASTY WITH STENT PLACEMENT  07/2011   "1" (12/22/2012)  . KNEE ARTHROSCOPY Left 12/31/2016  . LEFT HEART CATHETERIZATION WITH CORONARY ANGIOGRAM N/A 08/06/2011   Procedure: LEFT HEART CATHETERIZATION WITH CORONARY ANGIOGRAM;  Surgeon: Burnell Blanks, MD;  Location: [facility] CATH LAB;  Service: Cardiovascular;  Laterality: N/A;  . LEFT HEART CATHETERIZATION WITH CORONARY ANGIOGRAM N/A 12/15/2012   Procedure: LEFT HEART CATHETERIZATION WITH CORONARY ANGIOGRAM;  Surgeon: Sherren Mocha, MD;  Location: [facility] CATH LAB;  Service: Cardiovascular;  Laterality: N/A;  . LEFT HEART CATHETERIZATION WITH CORONARY ANGIOGRAM N/A 12/22/2012   Procedure: LEFT HEART CATHETERIZATION WITH CORONARY ANGIOGRAM;  Surgeon: Sherren Mocha, MD;  Location: [facility] CATH LAB;  Service: Cardiovascular;  Laterality: N/A;  . PERCUTANEOUS CORONARY STENT INTERVENTION (PCI-S) Right 12/15/2012   Procedure: PERCUTANEOUS CORONARY STENT INTERVENTION (PCI-S);  Surgeon: Sherren Mocha, MD;  Location: [facility] CATH LAB;  Service: Cardiovascular;  Laterality: Right;   Family History  Problem Relation Age of Onset  . Diabetes Brother   . Cancer Brother   . Diabetes Mother   . Coronary artery disease Father   . Heart attack Father 58  . Diabetes Sister   . Colon cancer Neg Hx   . Prostate cancer Neg Hx   . Stroke Neg Hx    Social History   Occupational History  . retired--maintenance tech    Social History Main Topics  . Smoking status: Former Smoker    Packs/day: 1.00    Years: 25.00    Types: Cigarettes    Quit date: 04/12/1992  .  Smokeless tobacco: Never Used  . Alcohol use Yes     Comment: 12/22/2012 "rarely maybe every 3-4 months, beer"  . Drug use: No  . Sexual activity: Yes   Tobacco Counseling Counseling given: Not Answered   Activities of Daily Living In your present state of health, do you have any difficulty performing the following activities: 04/27/2017 02/18/2017  Hearing? N N  Vision? N N  Comment wearing glasses. Diabetic eye exam done yearly per pt. -  Difficulty concentrating or making decisions? N N  Walking or climbing stairs? Y N  Comment painful knees -  Dressing or bathing? N N  Doing errands, shopping? N N  Preparing Food and eating ? N N  Using the Toilet? N N  In the past six months, have you accidently leaked urine? N N  Do  you have problems with loss of bowel control? N N  Managing your Medications? N Y  Comment - cost associated  Managing your Finances? N N  Housekeeping or managing your Housekeeping? N N  Some recent data might be hidden    Immunizations and Health Maintenance Immunization History  Administered Date(s) Administered  . Influenza Split 06/30/2011  . Influenza, High Dose Seasonal PF 05/31/2013  . Influenza, Seasonal, Injecte, Preservative Fre 07/28/2012  . Influenza,inj,Quad PF,6+ Mos 05/29/2014, 06/07/2015, 04/23/2016  . Pneumococcal Conjugate-13 12/05/2014  . Pneumococcal Polysaccharide-23 08/07/2011  . Tdap 05/31/2013  . Zoster 11/28/2012   Health Maintenance Due  Topic Date Due  . INFLUENZA VACCINE  03/24/2017    Patient Care Team: Colon Branch, MD as PCP - General (Internal Medicine) Larey Dresser, MD as Consulting Physician (Cardiology) Philemon Kingdom, MD as Consulting Physician (Internal Medicine) Ruedinger, Drexel Iha, [facility] as [place] Management (Pharmacist) Clerance Lav, RN as [place] any recent [place] you may have received from other than [facility] providers in the past  year (date may be approximate).    Assessment:   This is a routine wellness examination for Man. Physical assessment deferred to PCP.   Hearing/Vision screen Hearing Screening Comments: Able to hear conversational tones w/o difficulty. No issues reported.  Vision Screening Comments: Pt states he gets yearly eye exam-had one last month. Wearing glasses.  Dietary issues and exercise activities discussed: Current Exercise Habits: The patient does not participate in regular exercise at present, Exercise limited by: None identified Diet (meal preparation, eat out, water intake, caffeinated beverages, dairy products, fruits and vegetables): in general, an "unhealthy" diet  24 hr recall Breakfast: 2 eggs, bacon, grits, coffee Lunch: 9 pc wings and fries. Sweet tea. Dinner:  2 hotdogs, diet coke   Drinks about 6 glasses of water throughout the day.   Goals    . Weight (lb) < 275 lb (124.7 kg)          296lb today.  Will eating healthier and getting more exercise.      Depression Screen PHQ 2/9 Scores 04/27/2017 02/18/2017 02/15/2017 09/03/2016  PHQ - 2 Score 0 0 0 0  Exception Documentation - - - -    Fall Risk Fall Risk  04/27/2017 03/02/2017 02/18/2017 09/03/2016 01/23/2016  Falls in the past year? No No No No No    Cognitive Function: MMSE - Mini Mental State Exam 04/27/2017  Orientation to time 5  Orientation to Place 5  Registration 3  Attention/ Calculation 5  Recall 3  Language- name 2 objects 2  Language- repeat 1  Language- follow 3 step command 3  Language- read & follow direction 1  Write a sentence 1  Copy design 1  Total score 30        Screening Tests Health Maintenance  Topic Date Due  . INFLUENZA VACCINE  03/24/2017  . PNA vac Low Risk Adult (2 of 2 - PPSV23) 09/02/2018 (Originally 08/25/2016)  . HEMOGLOBIN A1C  07/23/2017  . FOOT EXAM  01/13/2018  . OPHTHALMOLOGY EXAM  03/29/2018  . Fecal DNA (Cologuard)  01/26/2020  . TETANUS/TDAP  06/01/2023  . Hepatitis  C Screening  Completed  . HIV Screening  Completed        Plan:   Follow up with PCP today as scheduled  Continue to eat heart healthy diet (full of fruits, vegetables, whole grains, lean protein, water--limit salt, fat, and sugar intake) and  increase physical activity as tolerated.  Continue doing brain stimulating activities (puzzles, reading, adult coloring books, staying active) to keep memory sharp.   I have personally reviewed and noted the following in the patient's chart:   . Medical and social history . Use of alcohol, tobacco or illicit drugs  . Current medications and supplements . Functional ability and status . Nutritional status . Physical activity . Advanced directives . List of other physicians . Hospitalizations, surgeries, and ER visits in previous 12 months . Vitals . Screenings to include cognitive, depression, and falls . Referrals and appointments  In addition, I have reviewed and discussed with patient certain preventive protocols, quality metrics, and best practice recommendations. A written personalized care plan for preventive services as well as general preventive health recommendations were provided to patient.     Naaman Plummer [place], [place]   04/27/2017    Kathlene November, MD

## 2017-04-22 ENCOUNTER — Other Ambulatory Visit: Payer: Self-pay | Admitting: Cardiology

## 2017-04-22 DIAGNOSIS — I2510 Atherosclerotic heart disease of native coronary artery without angina pectoris: Secondary | ICD-10-CM

## 2017-04-22 DIAGNOSIS — I10 Essential (primary) hypertension: Secondary | ICD-10-CM

## 2017-04-22 DIAGNOSIS — E785 Hyperlipidemia, unspecified: Secondary | ICD-10-CM

## 2017-04-23 ENCOUNTER — Other Ambulatory Visit: Payer: Self-pay | Admitting: Pharmacist

## 2017-04-23 NOTE — Patient Outreach (Signed)
[place] [facility]) Care Management  04/23/2017  Chevis Weisensel. [birth year] [id number]   Successful phone outreach to patient's spouse whom has been point of contact regarding his patient assistance for insulin.  She reports they have not heard about patient's insulin from [name] patient assistance.   Placed call to [name] patient assistance, representative reports Tyler Aas and Novolog were shipped 04/19/17 and delivered 04/20/17 at 1005 to MD office.   Called Dr Arman Filter office, representative reported they had the 8 boxes of Tresiba and 17 vials of Novolog that were delivered and they would be placed in a bag marked for patient.   Placed call back to patient's spouse---updated her [place] office has patient assistance supplied insulin.  Spouse reports they have insulin syringes and pen needles at this time.    Plan:  Will place call to patient/spouse next week to ensure they picked up insulin.   Karrie Meres, PharmD, [place] [phone]

## 2017-04-27 ENCOUNTER — Other Ambulatory Visit: Payer: Self-pay

## 2017-04-27 ENCOUNTER — Telehealth: Payer: Self-pay | Admitting: Internal Medicine

## 2017-04-27 ENCOUNTER — Other Ambulatory Visit: Payer: Self-pay | Admitting: Pharmacist

## 2017-04-27 ENCOUNTER — Encounter: Payer: Self-pay | Admitting: Internal Medicine

## 2017-04-27 ENCOUNTER — Ambulatory Visit (INDEPENDENT_AMBULATORY_CARE_PROVIDER_SITE_OTHER): Payer: PPO | Admitting: Internal Medicine

## 2017-04-27 ENCOUNTER — Encounter: Payer: Self-pay | Admitting: Cardiology

## 2017-04-27 VITALS — BP 142/64 | HR 60 | Ht 74.0 in | Wt 296.0 lb

## 2017-04-27 DIAGNOSIS — L989 Disorder of the skin and subcutaneous tissue, unspecified: Secondary | ICD-10-CM

## 2017-04-27 DIAGNOSIS — Z0000 Encounter for general adult medical examination without abnormal findings: Secondary | ICD-10-CM

## 2017-04-27 DIAGNOSIS — E119 Type 2 diabetes mellitus without complications: Secondary | ICD-10-CM | POA: Diagnosis not present

## 2017-04-27 DIAGNOSIS — I10 Essential (primary) hypertension: Secondary | ICD-10-CM | POA: Diagnosis not present

## 2017-04-27 DIAGNOSIS — E7800 Pure hypercholesterolemia, unspecified: Secondary | ICD-10-CM

## 2017-04-27 LAB — LIPID PANEL
CHOL/HDL RATIO: 6
Cholesterol: 189 mg/dL (ref 0–200)
HDL: 32.8 mg/dL — AB (ref >39.00)

## 2017-04-27 LAB — AST: AST: 16 U/L (ref 0–37)

## 2017-04-27 LAB — LDL CHOLESTEROL, DIRECT: Direct LDL: 74 mg/dL

## 2017-04-27 LAB — ALT: ALT: 19 U/L (ref 0–53)

## 2017-04-27 MED ORDER — INSULIN ASPART 100 UNIT/ML ~~LOC~~ SOLN: 45.0000 [IU] | Freq: Three times a day (TID) | SUBCUTANEOUS | 3 refills | Status: DC

## 2017-04-27 NOTE — Telephone Encounter (Signed)
Lennette Bihari from [facility] called to check the status of the   insulin aspart (NOVOLOG) 100 UNIT/ML injection   Due to receiving a message that the patient did not receive it however, someone at [facility] signed for it. Call the number provided to advise.

## 2017-04-27 NOTE — Progress Notes (Signed)
Subjective:    Patient ID: Randy Castro., male    DOB: 1950/10/12, 66 y.o.   MRN: [id number]  DOS:  04/27/2017 Type of visit - description : rov Interval history: Overall feeling well. CAD: Note from cardiology reviewed. HTN: Good compliance of medication, ambulatory BPs are within normal when checked. Skin lesion, left temple: Bothering him, increasing in  size?Marland Kitchen DM: Medications are very expensive, working on his diet although yesterday had a very unhealthy diet.    BP Readings from Last 3 Encounters:  04/27/17 (!) 142/64  04/07/17 124/72  03/19/17 120/74    Review of Systems  Denies chest pain or difficulty breathing   Past Medical History:  Diagnosis Date  . Arthritis   . CAD (coronary artery disease)    a. NSTEMI 12/12: EF 40-45%. OHY:[id number] balloon PTCA + Promus DES x 1 to mid LAD;  b. 11/2012: Cath with Cutting balloon PTCA  LAD for ISR to LAD, EF 55%;  c. 12/2012 Cath/PCI: LAD stents patent, 80 ost Diag (jailed)->PTCA, LCX/RCA patent.  . Essential hypertension   . Hyperlipidemia   . Ischemic cardiomyopathy    a.  echo 08/06/11: dist ant wall, apical and septal and infero-apical HK, mild LVH, EF 40%, mild LAE, PASP 34, asc aorta mildly dilated, mild TR;  b.  Echo (3/13) showed recovery of LV systolic function with EF 26-94%, grade I diastolic dysfunction, mild MR.   . Type 2 diabetes mellitus ([place])     Past Surgical History:  Procedure Laterality Date  . ANTERIOR CERVICAL DECOMP/DISCECTOMY FUSION  in the 90s  . CORONARY ANGIOPLASTY  12/15/2012; 12/22/2012  . CORONARY ANGIOPLASTY WITH STENT PLACEMENT  07/2011   "1" (12/22/2012)  . KNEE ARTHROSCOPY Left 12/31/2016  . LEFT HEART CATHETERIZATION WITH CORONARY ANGIOGRAM N/A 08/06/2011   Procedure: LEFT HEART CATHETERIZATION WITH CORONARY ANGIOGRAM;  Surgeon: Burnell Blanks, MD;  Location: [facility] CATH LAB;  Service: Cardiovascular;  Laterality: N/A;  . LEFT HEART CATHETERIZATION WITH CORONARY ANGIOGRAM N/A 12/15/2012   Procedure: LEFT HEART CATHETERIZATION WITH CORONARY ANGIOGRAM;  Surgeon: Sherren Mocha, MD;  Location: [facility] CATH LAB;  Service: Cardiovascular;  Laterality: N/A;  . LEFT HEART CATHETERIZATION WITH CORONARY ANGIOGRAM N/A 12/22/2012   Procedure: LEFT HEART CATHETERIZATION WITH CORONARY ANGIOGRAM;  Surgeon: Sherren Mocha, MD;  Location: [facility] CATH LAB;  Service: Cardiovascular;  Laterality: N/A;  . PERCUTANEOUS CORONARY STENT INTERVENTION (PCI-S) Right 12/15/2012   Procedure: PERCUTANEOUS CORONARY STENT INTERVENTION (PCI-S);  Surgeon: Sherren Mocha, MD;  Location: [facility] CATH LAB;  Service: Cardiovascular;  Laterality: Right;    Social History   Social History  . Marital status: Married    Spouse name: N/A  . Number of children: 2   . Years of education: N/A   Occupational History  . retired--maintenance tech    Social History Main Topics  . Smoking status: Former Smoker    Packs/day: 1.00    Years: 25.00    Types: Cigarettes    Quit date: 04/12/1992  . Smokeless tobacco: Never Used  . Alcohol use Yes     Comment: 12/22/2012 "rarely maybe every 3-4 months, beer"  . Drug use: No  . Sexual activity: Yes   Other Topics Concern  . Not on file   Social History Narrative   Moved back to [place] from [place]-- pt and wife      Allergies as of 04/27/2017      Reactions   Trulicity [dulaglutide] Nausea And Vomiting  Medication List       Accurate as of 04/27/17  4:17 PM. Always use your most recent med list.          acetaminophen 500 MG tablet Commonly known as:  TYLENOL Take 500 mg by mouth every 6 (six) hours as needed.   aspirin EC 81 MG tablet Take 81 mg by mouth daily.   atorvastatin 80 MG tablet Commonly known as:  LIPITOR Take 1 tablet (80 mg total) by mouth daily.   azelastine 0.1 % nasal spray Commonly known as:  ASTELIN Place 1-2 sprays into both nostrils 2 (two) times daily. Use in each nostril as directed   BD PEN NEEDLE NANO U/F 32G X 4 MM  Misc Generic drug:  Insulin Pen Needle USE FOR INJECTIONS FOUR TIMES DAILY   carvedilol 12.5 MG tablet Commonly known as:  COREG Take 12.5 mg by mouth daily.   citalopram 20 MG tablet Commonly known as:  CELEXA Take 1 tablet (20 mg total) by mouth daily.   clopidogrel 75 MG tablet Commonly known as:  PLAVIX TAKE 1 TABLET BY MOUTH EVERY DAY   clotrimazole-betamethasone cream Commonly known as:  LOTRISONE Apply topically 2 (two) times daily.   famotidine 20 MG tablet Commonly known as:  PEPCID Take 20 mg by mouth 2 (two) times daily as needed for heartburn.   fenofibrate 145 MG tablet Commonly known as:  TRICOR TAKE 1 TABLET BY MOUTH EVERY DAY   glucose blood test strip Use 3x a day - for OneTouch Ultra mini   insulin aspart 100 UNIT/ML injection Commonly known as:  novoLOG Inject 45 Units into the skin 3 (three) times daily before meals.   Insulin Degludec 200 UNIT/ML Sopn Commonly known as:  TRESIBA FLEXTOUCH Inject 60 Units into the skin 2 (two) times daily.   Insulin Syringe-Needle U-100 31G X 5/16" 0.5 ML Misc Commonly known as:  BD INSULIN SYRINGE ULTRAFINE Use 3x a day   losartan 100 MG tablet Commonly known as:  COZAAR Take 1 tablet (100 mg total) by mouth daily.   Magnesium 250 MG Tabs Take 1 tablet by mouth.   metFORMIN 500 MG 24 hr tablet Commonly known as:  GLUCOPHAGE-XR Take 1 tablet (500 mg total) by mouth 3 (three) times daily with meals.   multivitamin with minerals Tabs tablet Take 1 tablet by mouth daily.   nitroGLYCERIN 0.4 MG SL tablet Commonly known as:  NITROSTAT Place 1 tablet (0.4 mg total) under the tongue every 5 (five) minutes as needed for chest pain.   omega-3 acid ethyl esters 1 g capsule Commonly known as:  LOVAZA Take 2 capsules (2 g total) by mouth 2 (two) times daily.   Potassium 99 MG Tabs Take 1 tablet by mouth daily.   spironolactone 25 MG tablet Commonly known as:  ALDACTONE TAKE 1/2 TABLET BY MOUTH EVERY DAY             Discharge Care Instructions        Start     Ordered   04/27/17 1019  LDL cholesterol, direct     04/27/17 1019   04/27/17 0000  AST     04/27/17 1011   04/27/17 0000  ALT     04/27/17 1011   04/27/17 0000  Lipid panel     04/27/17 1011   04/27/17 0000  Ambulatory referral to Dermatology     04/27/17 1013         Objective:   Physical Exam BP Marland Kitchen)  142/64 (BP Location: Right Arm, Patient Position: Sitting, Cuff Size: Large)   Pulse 60   Ht 6\' 2"  (1.88 m)   Wt 296 lb (134.3 kg)   SpO2 95%   BMI 38.00 kg/m  General:   Well developed, well nourished . NAD.  HEENT:  Normocephalic . Face symmetric, atraumatic Lungs:  CTA B Normal respiratory effort, no intercostal retractions, no accessory muscle use. Heart: RRR,  no murmur.  No pretibial edema bilaterally  Skin:  Has some dark skin lesion, left temple, 11.5 cm , looks like SK Neurologic:  alert & oriented X3.  Speech normal, gait appropriate for age and unassisted Psych--  Cognition and judgment appear intact.  Cooperative with normal attention span and concentration.  Behavior appropriate. No anxious or depressed appearing.      Assessment & Plan:   Assessment   DM -- insulin dependent, uncontrolled, with complications, CAD CHF. Dr Cruzita Lederer HTN Hyperlipidemia CAD, CHF, ischemic cardiomyopathy NSTEMI in 12/12, unstable angina in 4/14 and 5/14. He had DES to mid LAD in 12/12. EF was 40% with apical hypokinesis at the time of the MI. Repeat echo in 3/13 showed EF 55-60%. In 4/14, he was admitted with unstable angina and had 90% pLAD in-stent restenosis. This was treated with cutting balloon angioplasty. Readmitted, again with unstable angina, in 5/14. This time he had PTCA to 90% ostial D1 stenosis.  Anxiety-depression Skin cancer: Right leg, status post excision 2016; Dr Allyson Sabal  PLAN: DM: Poorly controlled, will see endocrinology soon; we talk about diet High cholesterol: On Lipitor,  TriCor, Lovaza. Check a FLP, AST ALT HTN: Recent BMP satisfactory, BP today is 142, recommend to continue with carvedilol, losartan, aldactone. See instructions, recommend to monitor BPs. Skin lesion, temple: Likely SK, refer to Dr. Allyson Sabal RTC 6 months

## 2017-04-27 NOTE — Assessment & Plan Note (Signed)
DM: Poorly controlled, will see endocrinology soon; we talk about diet High cholesterol: On Lipitor, TriCor, Lovaza. Check a FLP, AST ALT HTN: Recent BMP satisfactory, BP today is 142, recommend to continue with carvedilol, losartan, aldactone. See instructions, recommend to monitor BPs. Skin lesion, temple: Likely SK, refer to Dr. Allyson Sabal RTC 6 months

## 2017-04-27 NOTE — Patient Instructions (Addendum)
GO TO THE LAB : Get the blood work     GO TO THE FRONT DESK Schedule your next appointment for a  Check up with me  in 6 months     Check the  blood pressure 2 or 3 times  Week  Be sure your blood pressure is between 110/65 and  140/85. If it is consistently higher or lower, let me know      Randy Castro , Thank you for taking time to come for your Medicare Wellness Visit. I appreciate your ongoing commitment to your health goals. Please review the following plan we discussed and let me know if I can assist you in the future.   These are the goals we discussed: Goals    . Weight (lb) < 275 lb (124.7 kg)          296lb today.  Will eating healthier and getting more exercise.       This is a list of the screening recommended for you and due dates:  Health Maintenance  Topic Date Due  . Flu Shot  03/24/2017  . Pneumonia vaccines (2 of 2 - PPSV23) 09/02/2018*  . Hemoglobin A1C  07/23/2017  . Complete foot exam   01/13/2018  . Eye exam for diabetics  03/29/2018  . Cologuard (Stool DNA test)  01/26/2020  . Tetanus Vaccine  06/01/2023  .  Hepatitis C: One time screening is recommended by Center for Disease Control  (CDC) for  adults born from [birth year] through 1964.   Completed  . HIV Screening  Completed  *Topic was postponed. The date shown is not the original due date.   Continue to eat heart healthy diet (full of fruits, vegetables, whole grains, lean protein, water--limit salt, fat, and sugar intake) and increase physical activity as tolerated.  Continue doing brain stimulating activities (puzzles, reading, adult coloring books, staying active) to keep memory sharp.

## 2017-04-27 NOTE — Patient Outreach (Signed)
[facility]) Care Management  04/27/2017  Jaxon Flatt. 26-Oct-1950 [id number]  Received message from patient's spouse that Dr Arman Filter office only had Tyler Aas and did not have Novolog patient assistance. This is different than what [facility] Pharmacist was told last Friday when prescriber office was called regarding patient assistance shipment.   Placed call to prescriber's office and left message requesting return call.   Placed call to patient---left HIPAA compliant message requesting return call and spouse called back.    Discussed [facility] Pharmacist left message for prescriber office and will follow-up with patient/spouse when [facility] Pharmacist learns new information.  Spouse asked about directions for Anastasia Fiedler was counseled on directions of Tresiba per prescription Dr Cruzita Lederer wrote, and spouse verified dosage was on after visit summary patient received from PCP appointment today.   Spouse was counseled patient should not start [place] until he receives his Novolog as well.  She was counseled Novolog and Novolin are different insulins.  She reports patient has pen needles for [place].  Plan:  Continue to follow-up regarding patient assistance.   Karrie Meres, PharmD, [place] [phone]

## 2017-04-27 NOTE — Telephone Encounter (Signed)
novolog has been ordered

## 2017-04-27 NOTE — Telephone Encounter (Signed)
Patient wife came to pick up patient insulin tresebia, but patient didn't receive the Novalog. Please advise

## 2017-04-28 ENCOUNTER — Telehealth: Payer: Self-pay

## 2017-04-28 MED ORDER — CLOPIDOGREL BISULFATE 75 MG PO TABS: 75.0000 mg | ORAL_TABLET | Freq: Every day | ORAL | 3 refills | Status: DC

## 2017-04-28 MED ORDER — ATORVASTATIN CALCIUM 80 MG PO TABS: 80.0000 mg | ORAL_TABLET | Freq: Every day | ORAL | 3 refills | Status: DC

## 2017-04-29 ENCOUNTER — Ambulatory Visit: Payer: PPO | Admitting: Internal Medicine

## 2017-04-29 ENCOUNTER — Other Ambulatory Visit: Payer: Self-pay

## 2017-04-29 DIAGNOSIS — E782 Mixed hyperlipidemia: Secondary | ICD-10-CM

## 2017-04-29 DIAGNOSIS — I2510 Atherosclerotic heart disease of native coronary artery without angina pectoris: Secondary | ICD-10-CM

## 2017-04-29 DIAGNOSIS — I10 Essential (primary) hypertension: Secondary | ICD-10-CM

## 2017-04-29 MED ORDER — OMEGA-3-ACID ETHYL ESTERS 1 G PO CAPS: 2.0000 | ORAL_CAPSULE | Freq: Two times a day (BID) | ORAL | 11 refills | Status: DC

## 2017-04-29 NOTE — Telephone Encounter (Signed)
refilled lovaza

## 2017-04-30 ENCOUNTER — Other Ambulatory Visit: Payer: Self-pay | Admitting: Pharmacist

## 2017-04-30 NOTE — Patient Outreach (Signed)
[facility]) Care Management  04/30/2017  Randy Castro. Jun 12, 1951 [id number]  Received voicemail from patient's spouse Novolog prescription was sent by prescriber's office to local pharmacy.   Patient had been approved for manufacturer patient assistance from [name] for [place] and Novolog through 07/23/17.  Per phone call to [name] patient assistance on 04/23/17 with patient's spouse, 8 boxes of Tresiba and 17 vials of Novolog were shipped 04/19/17 and delivered to prescriber office 04/20/17 at 1005.    Patient had only been given [place] when patient went to office to pick up insulin and office reported not getting Novolog.  Had placed call to office 04/27/17 but did not receive a call back.    Placed call to [facility] Endocrinology today and spoke with Colletta Maryland who reported there was not a packing slip with the shipment and they did not receive Novolog.  Given discrepancy between office and patient assistance program, requested Colletta Maryland or someone within prescriber office contact [name] patient assistance program and discuss what happened.  Provided phone number for [name] patient assistance program.   Returned call to patient's spouse, updated her on the above.   Spouse reports affordability of Novolog is an issue, which was purpose of patient applying for manufacturer patient assistance.    Updated spouse [name] Pharmacist will continue to work with patient/spouse regarding this.   Plan:  Discussed case with [facility] Economist.   Will route this note to Dr Cruzita Lederer.    Karrie Meres, PharmD, [name] [phone]

## 2017-05-05 ENCOUNTER — Other Ambulatory Visit: Payer: Self-pay | Admitting: Pharmacist

## 2017-05-05 NOTE — Telephone Encounter (Signed)
Lennette Bihari from [facility] called to speak to nurse about the NOVOLOG, transferred the call to [facility].

## 2017-05-05 NOTE — Patient Outreach (Signed)
[facility]) Care Management  05/05/2017  Atthew Coutant. 1951/01/23 [id number]  Successful outreach to patient's spouse. She reports they have not heard anything regarding Novolog from prescriber.   Phone call to [employer] with Lattie Haw and discussed per [name] patient assistance 8 boxes of Tyler Aas (which were picked up by patient) and 17 vials of Novolog were signed for on 04/20/17 at 1005.  Discussed per call to prescriber office 04/30/17 someone was to follow-up with [name] regarding discrepancy of Novolog not being received.   Lisa called [facility] Pharmacist back and reports they have 17 vials of Novolog for patient to be able to pick-up.   Call placed to patient's spouse, updated her per Lattie Haw, with [facility] Endocrinology, Novolog patient assistance is ready for patient/spouse to pick-up.    Spouse reports patient has enough diabetes testing supplies but may need syringes---encouraged her to ensure patient has enough syringes and insulin due to potential upcoming weather.    Plan:  Will place a call to patient/spouse to make sure they have received all patient assistance supplied insulin.   Karrie Meres, PharmD, [place] [phone]

## 2017-05-07 ENCOUNTER — Other Ambulatory Visit: Payer: Self-pay | Admitting: Pharmacist

## 2017-05-07 NOTE — Patient Outreach (Signed)
[facility]) Care Management  05/07/2017  Randy Castro. 09/17/1950 [id number]   Successful phone outreach to patient's spouse.  HIPAA details verified.  Patient's spouse reports they picked up 17 vials of Novolog from Dr Arman Filter office for [name] patient assistance yesterday.    Patient's spouse reports patient has directions for dose and repeated directions based on prescription Dr Cruzita Lederer wrote.  She reports patient has plenty of insulin pen needles and syringes as well as testing supplies for his glucometer.    Discussed proper storage of insulin with patient's spouse. Encouraged them to store in fridge, insulin not in use.  Discussed if they suffer power outage with impending weather, and insulin gets warm to contact [name] and we can contact [name] patient assistance program regarding options.    Spouse was encouraged to contact [name] Pharmacist if new needs arise of if pharmacy related questions arise.  She asked about patient assistance for 2019---discussed unsure what program requirements will be in 2019 for manufacturer and they can contact [name] Pharmacist to discuss options in 2019.   Plan:  Will close this pharmacy episode.    Will update [name] RN [name].   Patient (was in back ground during call) and spouse were advised to call [name] Pharmacist if needed.   Karrie Meres, PharmD, [place] [phone]

## 2017-05-11 ENCOUNTER — Ambulatory Visit: Payer: Self-pay | Admitting: *Deleted

## 2017-05-18 ENCOUNTER — Other Ambulatory Visit: Payer: Self-pay | Admitting: *Deleted

## 2017-05-18 NOTE — Patient Outreach (Signed)
[facility]) Care Management   05/18/2017  Randy Castro. [birth year] [id number]  Randy Castro. is an 66 y.o. male gentleman who lives in [place] with [name]. His medical history includes Uncontrolled Insulin DependentType II Diabetes Mellitus (diagnosed 1998), hypertension, hyperlipidemia, CAD s/p MI in 2012, s/p PTCA x 2, ischemic cardiomyopathy, degenerative joint disease with left knee arthroscopy and partial medial meniscectomy on May 1 of this year.   Subjective: "I think I'm on track now."  Objective:  BP (!) 148/70   Pulse 60   SpO2 97%   Review of Systems  Constitutional: Negative.   HENT: Negative.   Eyes: Negative.   Respiratory: Negative.   Cardiovascular: Negative.   Gastrointestinal: Negative.   Genitourinary: Negative.   Musculoskeletal: Negative.  Negative for falls.  Skin: Negative.   Neurological: Negative.   Psychiatric/Behavioral: Negative.     Physical Exam  Constitutional: He is oriented to person, place, and time. Vital signs are normal. He appears well-developed and well-nourished. He is active. He does not have a sickly appearance. He does not appear ill.  Cardiovascular: Normal rate and regular rhythm.   Respiratory: Effort normal and breath sounds normal. He has no wheezes. He has no rhonchi. He has no rales.  GI: Soft. Bowel sounds are normal. He exhibits no distension.  Neurological: He is alert and oriented to person, place, and time.  Skin: Skin is warm, dry and intact.  Psychiatric: He has a normal mood and affect. His speech is normal and behavior is normal. Judgment and thought content normal. Cognition and memory are normal.    Encounter Medications:   Outpatient Encounter Prescriptions as of 05/18/2017  Medication Sig Note  . acetaminophen (TYLENOL) 500 MG tablet Take 500 mg by mouth every 6 (six) hours as needed.   Marland Kitchen aspirin EC 81 MG tablet Take 81 mg by mouth daily.   Marland Kitchen atorvastatin (LIPITOR) 80 MG tablet  Take 1 tablet (80 mg total) by mouth daily.   Marland Kitchen azelastine (ASTELIN) 0.1 % nasal spray Place 1-2 sprays into both nostrils 2 (two) times daily. Use in each nostril as directed   . BD PEN NEEDLE NANO U/F 32G X 4 MM MISC USE FOR INJECTIONS FOUR TIMES DAILY (Patient not taking: Reported on 04/27/2017)   . carvedilol (COREG) 12.5 MG tablet Take 12.5 mg by mouth daily.   . citalopram (CELEXA) 20 MG tablet Take 1 tablet (20 mg total) by mouth daily.   . clopidogrel (PLAVIX) 75 MG tablet TAKE 1 TABLET BY MOUTH EVERY DAY   . clopidogrel (PLAVIX) 75 MG tablet Take 1 tablet (75 mg total) by mouth daily.   . clotrimazole-betamethasone (LOTRISONE) cream Apply topically 2 (two) times daily. (Patient taking differently: Apply topically 2 (two) times daily as needed. ) 02/18/2017: Uses as needed  . famotidine (PEPCID) 20 MG tablet Take 20 mg by mouth 2 (two) times daily as needed for heartburn.   . fenofibrate (TRICOR) 145 MG tablet TAKE 1 TABLET BY MOUTH EVERY DAY   . glucose blood test strip Use 3x a day - for OneTouch Ultra mini   . insulin aspart (NOVOLOG) 100 UNIT/ML injection Inject 45 Units into the skin 3 (three) times daily before meals.   . Insulin Degludec (TRESIBA FLEXTOUCH) 200 UNIT/ML SOPN Inject 60 Units into the skin 2 (two) times daily.   . Insulin Syringe-Needle U-100 (BD INSULIN SYRINGE ULTRAFINE) 31G X 5/16" 0.5 ML MISC Use 3x a day   .  losartan (COZAAR) 100 MG tablet Take 1 tablet (100 mg total) by mouth daily.   . Magnesium 250 MG TABS Take 1 tablet by mouth.   . metFORMIN (GLUCOPHAGE-XR) 500 MG 24 hr tablet Take 1 tablet (500 mg total) by mouth 3 (three) times daily with meals.   . Multiple Vitamin (MULTIVITAMIN WITH MINERALS) TABS Take 1 tablet by mouth daily.   . nitroGLYCERIN (NITROSTAT) 0.4 MG SL tablet Place 1 tablet (0.4 mg total) under the tongue every 5 (five) minutes as needed for chest pain. 02/18/2017: On hand; not using; expiration date 04/2018  . omega-3 acid ethyl esters (LOVAZA)  1 g capsule Take 2 capsules (2 g total) by mouth 2 (two) times daily.   . Potassium 99 MG TABS Take 1 tablet by mouth daily.   Marland Kitchen spironolactone (ALDACTONE) 25 MG tablet TAKE 1/2 TABLET BY MOUTH EVERY DAY    Assessment:   66 year old with diabetes self health management deficits and medication assistance needs.  Medication Management/patient Assistance- the [facility] pharmacy team helped [name] apply for patient assistance through [place] and [name] wife was able to pick up 17 vials of Novolog from Dr Arman Filter office on 05/06/17. [name] has on hand a large supply of insulin pen needles and syringes as well as testing supplies for his glucometer. [name] and his wife verbalize no further needs or questions about medication assistance and his pharmacy case has been closed.   Diabetes Management- [name] cbg average was in the 500's when we first met in June. By August, his cbg average was in the 300's. Today, cbg averages are in the upper 200's (monitor does not record averages).    [name] has lost approximately 30 pounds in the last 3 months mostly by making dietary changes. He is to see the endocrinologist again in October.   As has been true every visit, [name] and his wife are very interested in nutrition information. We discussed the details around [name] carb modified diet again today. He and his wife have worked diligently on dietary changes over the last 3 months.   [name] says he feels he has direction now and can carry on independently with self health management related to his diabetes.   Health Maintenance - [name] had a primary care appointment on 04/28/17 and had a lipid panel and LFT's drawn. He has an appointment with endocrinology on 05/27/17.   Plan: [name] is going to carry on documenting his dietary intake and cbg's in his [place] [facility] calendar and documentation tool. He will also add short walks around the apartment complex when the  weather permits. [name] will see his providers as scheduled.   [name] agrees that he is in a good position to carry on with diabetes self health management, especially now that he has all prescribed medications. He agrees to case closure today. I will reach out to him by phone in a month to ensure that no new needs have arisen and he will call me thereafter at any time in the future if he needs further assistance.    [place] Management  [phone]

## 2017-05-22 ENCOUNTER — Other Ambulatory Visit: Payer: Self-pay | Admitting: Internal Medicine

## 2017-05-22 ENCOUNTER — Other Ambulatory Visit: Payer: Self-pay | Admitting: Cardiology

## 2017-05-27 ENCOUNTER — Encounter: Payer: Self-pay | Admitting: Internal Medicine

## 2017-05-27 ENCOUNTER — Ambulatory Visit (INDEPENDENT_AMBULATORY_CARE_PROVIDER_SITE_OTHER): Payer: PPO | Admitting: Internal Medicine

## 2017-05-27 VITALS — BP 140/82 | HR 67 | Wt 296.0 lb

## 2017-05-27 DIAGNOSIS — Z6836 Body mass index (BMI) 36.0-36.9, adult: Secondary | ICD-10-CM

## 2017-05-27 DIAGNOSIS — E1165 Type 2 diabetes mellitus with hyperglycemia: Secondary | ICD-10-CM | POA: Diagnosis not present

## 2017-05-27 DIAGNOSIS — E1159 Type 2 diabetes mellitus with other circulatory complications: Secondary | ICD-10-CM

## 2017-05-27 DIAGNOSIS — Z23 Encounter for immunization: Secondary | ICD-10-CM

## 2017-05-27 LAB — POCT GLYCOSYLATED HEMOGLOBIN (HGB A1C): Hemoglobin A1C: 11.7

## 2017-05-27 MED ORDER — INSULIN ASPART 100 UNIT/ML ~~LOC~~ SOLN: 55.0000 [IU] | Freq: Three times a day (TID) | SUBCUTANEOUS | 3 refills | Status: DC

## 2017-05-27 MED ORDER — INSULIN DEGLUDEC 200 UNIT/ML ~~LOC~~ SOPN: 80.0000 [IU] | PEN_INJECTOR | Freq: Two times a day (BID) | SUBCUTANEOUS | 3 refills | Status: DC

## 2017-05-27 NOTE — Patient Instructions (Addendum)
Your BMI is 38 today.  Please call your insurance and ask about bariatric surgery coverage (gastric sleeve surgery).  Please look up "The Engine 2 diet" by Christa See. Try also "The 7 day Rescue Diet".  Please continue: - Metformin 500 mg 3x a day  Please increase:  - Tresiba 80 units 2x a day  - Novolog 55 units 3x a day  Please return in 3 months with your sugar log.

## 2017-05-27 NOTE — Progress Notes (Signed)
Subjective:     Patient ID: Randy Castro., male   DOB: 08-08-1951, 66 y.o.   MRN: [id number]  HPI Review of Systems Objective:   Physical Exam Assessment:   HPI Randy Castro is a pleasant 66 y.o. man returning for f/u for DM2, dx 1998, uncontrolled, insulin dependent, with complications (CAD, s/p MI 07/2011, s/p PTCA with DES, s/p PTCA for in-stent restenosis 12/15/12; also ED).  Last visit was 4 mo ago. He is here with his wife who offers part of the hx especially about his medications , insurance coverage, and his diet.  Reviewed HbA1c levels:  Lab Results  Component Value Date   HGBA1C 9.7 01/21/2017   HGBA1C 9.8 09/18/2016   HGBA1C 9.4 05/25/2016   He was on a regimen of: - Basaglar 75 units at bedtime - not covered anymore - Metformin ER 500 mg 3x a day - Jardiance 25 mg in am  - not covered anymore - NovoLog: - very expensive 25 units with a smaller meal 35 units with a large meal  We started U500 insulin, but not covered (800-900$ a month) - U500 insulin: - 150 units before b'fast - 120 units before dinner - Metformin 500 mg 3x a day  Now on (started 3 weeks ago):  - Metformin 500 mg 3x a day  - Tresiba 60 units 2x a day  - Novolog 45 units 3x a day  We tried to start Bydureon 2 g weekly 05/2016 >> not covered We stopped Glipizide when we started mealtime insulin. We stopped Bydureon when we started mealtime insulin We stopped Januvia 100 in 12/2013. He was on JanuMet 50/1000 bid >> stopped 2/2 diarrhea, fatigue >> restarted but with same SEs >> stopped. He retired on 08/15/2013 >> less activity, increased weight. Sugars have greatly increased >> we added Invokana. We had to stop this and switch to [place] per insurance coverage. He could not take maximum dose of metformin XR due to diarrhea. He could not tolerate Trulicity due to nausea and vomiting. We had to stop Toujeo 2/2 insurance coverage.  He checks his sugars 2-3x a day: - am: 268-370 >> 116-278 in  last 3 weeks >> 316-345 - after b'fast: 367 >> 158 >> n/c >> 231, 315 >> n/c  - before lunch: 263-320 >> 186, 268, memorial day: 326 >> 293, 374 - 2h after lunch: 143 >> n/c - before dinner:  177, 233, 303 >> 280 >> 158-221 >> 399 - bedtime:  178, 240 >> n/c >> 140, 205 >> n/c - nighttime:  103 >> n/c >> 84, 114-132 >> n/c He has hypoglycemia awareness at 66.  - No CKD. Last BUN/Cr:  Lab Results  Component Value Date   BUN 13 01/27/2017   CREATININE 0.78 01/27/2017  On Losartan.  - + HL: Lab Results  Component Value Date   CHOL 189 04/27/2017   HDL 32.80 (L) 04/27/2017   LDLCALC 46 05/18/2016   LDLDIRECT 74.0 04/27/2017   TRIG (H) 04/27/2017    464.0 Triglyceride is over 400; calculations on Lipids are invalid.   CHOLHDL 6 04/27/2017  On Lipitor. - No neuropathy per foot exam 12/2015 - last eye exam on 03/2017 >> No DR. He is on ASA 81  PMH:   Pt had a period off the meds for cost reasons and was restarted on DM meds in 03/2011. In 07/2011, he had a NSTEMI and had a DES placed. He also has CHF, improved (EF increased from 40-45% in 07/2011  to 55-60% in 10/2011). Sees Dr. Aundra Dubin. He had CP/SOB >> saw Dr. Percival Spanish on 12/15/3012 >> heart catheterization >> PTCA for LAD instent restenosis.  Review of Systems Constitutional: no weight gain/no weight loss, no fatigue, no subjective hyperthermia, no subjective hypothermia Eyes: no blurry vision, no xerophthalmia ENT: no sore throat, no nodules palpated in throat, no dysphagia, no odynophagia, no hoarseness Cardiovascular: no CP/no SOB/no palpitations/no leg swelling Respiratory: no cough/no SOB/no wheezing Gastrointestinal: no N/no V/no D/no C/no acid reflux Musculoskeletal: no muscle aches/no joint aches Skin: no rashes, no hair loss Neurological: no tremors/no numbness/no tingling/no dizziness  I reviewed pt's medications, allergies, PMH, social hx, family hx, and changes were documented in the history of present illness.  Otherwise, unchanged from my initial visit note.   Objective:   Physical Exam BP 140/82 (BP Location: Left Arm, Patient Position: Sitting)   Pulse 67   Wt 296 lb (134.3 kg)   SpO2 95%   BMI 38.00 kg/m  Body mass index is 38 kg/m.  Wt Readings from Last 3 Encounters:  05/27/17 296 lb (134.3 kg)  04/27/17 296 lb (134.3 kg)  04/07/17 288 lb (130.6 kg)   Constitutional: overweight, in NAD Eyes: PERRLA, EOMI, no exophthalmos ENT: moist mucous membranes, no thyromegaly, no cervical lymphadenopathy Cardiovascular: RRR, No MRG Respiratory: CTA B Gastrointestinal: abdomen soft, NT, ND, BS+ Musculoskeletal: no deformities, strength intact in all 4 Skin: moist, warm, no rashes Neurological: no tremor with outstretched hands, DTR normal in all 4  Assessment:     1. DM2, insulin dependent, uncontrolled, with complications: - CAD, s/p NSTEMI 07/2011, with PTCA + DES (Dr. Aundra Dubin), s/p PTCA for in-stent restenosis 12/15/2012- CHF, EF 55-60% per 2D ECHO in 10/2011 - ED - no DR - no nephropathy  2. Obesity class 2 BMI Classification:  < 18.5 underweight   18.5-24.9 normal weight   25.0-29.9 overweight   30.0-34.9 class I obesity   35.0-39.9 class II obesity   ? 40.0 class III obesity     Plan:     Pt with long standing uncontrolled DM, now with worse sugars as his insurance is not covering U500 insulin and the U100-U200 insulin is not helping him, even at >200 units a day. This is an unfortunate situation, and the only option we have right now is to increase insulin although I explained that large doses of insulin are not absorbed well from the injection site.  However, we also  discussed about changing his diet or having Gastric bypass Sx >> see Pb #2. - I advised him to: Patient Instructions  Your BMI is 38 today.  Please call your insurance and ask about bariatric surgery coverage (gastric sleeve surgery).  Please look up "The Engine 2 diet" by Christa See. Try also  "The 7 day Rescue Diet".  Please continue: - Metformin 500 mg 3x a day  Please increase:  - Tresiba 80 units 2x a day  - Novolog 55 units 3x a day  Please return in 3 months with your sugar log.   - today, HbA1c is 11.3% (higher!) - continue checking sugars at different times of the day - check 3x a day, rotating checks - advised for yearly eye exams >> he is UTD - + flu shot today - Return to clinic in 3 mo with sugar log    2. Obesity - weight is higher at this visit - we had a long discussion about the absolute need to lose weight and also to improve his diabetes -  I advised him he has 2 options >> a major change in diet or GBP Sx (gastric sleeve). I advise him to call his insurance and see if his insurance will cover the sleeve Sx. Explained what this means and also explained periop management.  - we also discussed about changing his diet and I made specific suggestions to switch to a plant-based diet as this was shown to have the most salutary impact on diabetes - pt will look into both options  - time spent with the patient and his wife: 28, of which >50% was spent in reviewing his  Insulin doses, diabetes control, blood sugar logs, counseling him about his diabetes and obesity (please see the discussed topics above), and developing a plan to improve both; he and his wifehad a number of questions which I addressed.   Philemon Kingdom, MD PhD [facility]

## 2017-05-27 NOTE — Addendum Note (Signed)
Addended by: Caprice Beaver T on: 05/27/2017 04:22 PM   Modules accepted: Orders

## 2017-05-31 ENCOUNTER — Encounter: Payer: Self-pay | Admitting: Internal Medicine

## 2017-06-01 ENCOUNTER — Other Ambulatory Visit: Payer: Self-pay | Admitting: Internal Medicine

## 2017-06-01 ENCOUNTER — Other Ambulatory Visit: Payer: Self-pay | Admitting: Cardiology

## 2017-06-01 DIAGNOSIS — I2510 Atherosclerotic heart disease of native coronary artery without angina pectoris: Secondary | ICD-10-CM

## 2017-06-01 DIAGNOSIS — E1159 Type 2 diabetes mellitus with other circulatory complications: Secondary | ICD-10-CM

## 2017-06-01 DIAGNOSIS — I10 Essential (primary) hypertension: Secondary | ICD-10-CM

## 2017-06-01 DIAGNOSIS — E785 Hyperlipidemia, unspecified: Secondary | ICD-10-CM

## 2017-06-01 DIAGNOSIS — Z6836 Body mass index (BMI) 36.0-36.9, adult: Principal | ICD-10-CM

## 2017-06-01 DIAGNOSIS — E1165 Type 2 diabetes mellitus with hyperglycemia: Secondary | ICD-10-CM

## 2017-06-08 DIAGNOSIS — L570 Actinic keratosis: Secondary | ICD-10-CM | POA: Diagnosis not present

## 2017-06-08 DIAGNOSIS — D225 Melanocytic nevi of trunk: Secondary | ICD-10-CM | POA: Diagnosis not present

## 2017-06-08 DIAGNOSIS — L820 Inflamed seborrheic keratosis: Secondary | ICD-10-CM | POA: Diagnosis not present

## 2017-06-14 ENCOUNTER — Other Ambulatory Visit: Payer: Self-pay | Admitting: *Deleted

## 2017-06-14 NOTE — Patient Outreach (Signed)
[facility]) Care Management  06/14/2017  Randy Castro 03-17-1951 [id number]  I called Randy Castro today as promised to follow up on him one last time since his official discharge from [facility]. He reports that he went back to see his endocrinologist and his blood sugars were back up. He said he was told by Dr. Letta Median that his medication was no longer working for him and that he should consider either bariatric surgery and/or significant dietary overhaul. She made a few recommendations to him about dietary strategies. Today, Randy Castro tells me he has been working "really hard" on his diet and has been able to get his cbg's "in the 170's" over the last week. He says his plan is to continue to work diligently on his diabetes management and go back to see Dr. Cruzita Lederer in 3 months.   I offered to continue working with Randy Castro but he declined, expressing his gratitude for the work we have already done together and the knowledge of our support.   Plan: I will leave Randy Castro case closed but am available to him anytime in the future should he desire ongoing support.    [facility]  [phone]

## 2017-06-15 ENCOUNTER — Encounter: Payer: Self-pay | Admitting: Internal Medicine

## 2017-06-17 ENCOUNTER — Telehealth: Payer: Self-pay

## 2017-06-17 NOTE — Telephone Encounter (Signed)
Randy Castro- I tried to locate fax on the black tower by your desk but couldn't find it to re-fax.

## 2017-06-17 NOTE — Telephone Encounter (Signed)
Horris Latino from [facility] called and stated she spoke with Almyra Free about needing a referral to be faxed and she stated she has not received it yet and needs it to be re-faxed to [phone] at your earliest [place]

## 2017-06-18 NOTE — Telephone Encounter (Signed)
I resubmitted a fax back to them to please call and ask to speak to me regarding this referral. The referral was placed to [facility] so not sure why [facility] has it. Thanks!

## 2017-06-21 ENCOUNTER — Other Ambulatory Visit: Payer: Self-pay | Admitting: Cardiology

## 2017-06-22 NOTE — Telephone Encounter (Signed)
This is Dr. McDowell's pt 

## 2017-07-06 ENCOUNTER — Other Ambulatory Visit: Payer: Self-pay | Admitting: Internal Medicine

## 2017-07-07 ENCOUNTER — Other Ambulatory Visit: Payer: Self-pay

## 2017-07-21 DIAGNOSIS — Z01818 Encounter for other preprocedural examination: Secondary | ICD-10-CM | POA: Diagnosis not present

## 2017-07-21 DIAGNOSIS — Z7189 Other specified counseling: Secondary | ICD-10-CM | POA: Diagnosis not present

## 2017-07-21 DIAGNOSIS — I10 Essential (primary) hypertension: Secondary | ICD-10-CM | POA: Diagnosis not present

## 2017-07-21 DIAGNOSIS — E119 Type 2 diabetes mellitus without complications: Secondary | ICD-10-CM | POA: Diagnosis not present

## 2017-07-27 DIAGNOSIS — E1159 Type 2 diabetes mellitus with other circulatory complications: Secondary | ICD-10-CM | POA: Diagnosis not present

## 2017-07-27 DIAGNOSIS — Z01818 Encounter for other preprocedural examination: Secondary | ICD-10-CM | POA: Diagnosis not present

## 2017-07-27 DIAGNOSIS — Z9884 Bariatric surgery status: Secondary | ICD-10-CM | POA: Diagnosis not present

## 2017-08-03 DIAGNOSIS — I10 Essential (primary) hypertension: Secondary | ICD-10-CM | POA: Diagnosis not present

## 2017-08-03 DIAGNOSIS — E669 Obesity, unspecified: Secondary | ICD-10-CM | POA: Diagnosis not present

## 2017-08-03 DIAGNOSIS — E119 Type 2 diabetes mellitus without complications: Secondary | ICD-10-CM | POA: Diagnosis not present

## 2017-08-03 DIAGNOSIS — E785 Hyperlipidemia, unspecified: Secondary | ICD-10-CM | POA: Diagnosis not present

## 2017-08-04 DIAGNOSIS — K2970 Gastritis, unspecified, without bleeding: Secondary | ICD-10-CM | POA: Diagnosis not present

## 2017-08-04 DIAGNOSIS — K3189 Other diseases of stomach and duodenum: Secondary | ICD-10-CM | POA: Diagnosis not present

## 2017-08-04 DIAGNOSIS — K279 Peptic ulcer, site unspecified, unspecified as acute or chronic, without hemorrhage or perforation: Secondary | ICD-10-CM | POA: Diagnosis not present

## 2017-08-04 DIAGNOSIS — K2980 Duodenitis without bleeding: Secondary | ICD-10-CM | POA: Diagnosis not present

## 2017-08-04 DIAGNOSIS — K259 Gastric ulcer, unspecified as acute or chronic, without hemorrhage or perforation: Secondary | ICD-10-CM | POA: Diagnosis not present

## 2017-08-04 DIAGNOSIS — Z01818 Encounter for other preprocedural examination: Secondary | ICD-10-CM | POA: Diagnosis not present

## 2017-08-23 DIAGNOSIS — E119 Type 2 diabetes mellitus without complications: Secondary | ICD-10-CM | POA: Diagnosis not present

## 2017-08-23 DIAGNOSIS — E785 Hyperlipidemia, unspecified: Secondary | ICD-10-CM | POA: Diagnosis not present

## 2017-08-23 DIAGNOSIS — R0683 Snoring: Secondary | ICD-10-CM | POA: Diagnosis not present

## 2017-08-23 DIAGNOSIS — I10 Essential (primary) hypertension: Secondary | ICD-10-CM | POA: Diagnosis not present

## 2017-09-07 DIAGNOSIS — E119 Type 2 diabetes mellitus without complications: Secondary | ICD-10-CM | POA: Diagnosis not present

## 2017-09-07 DIAGNOSIS — Z794 Long term (current) use of insulin: Secondary | ICD-10-CM | POA: Diagnosis not present

## 2017-09-07 DIAGNOSIS — I10 Essential (primary) hypertension: Secondary | ICD-10-CM | POA: Diagnosis not present

## 2017-09-07 DIAGNOSIS — E785 Hyperlipidemia, unspecified: Secondary | ICD-10-CM | POA: Diagnosis not present

## 2017-09-07 DIAGNOSIS — Z01812 Encounter for preprocedural laboratory examination: Secondary | ICD-10-CM | POA: Diagnosis not present

## 2017-09-20 DIAGNOSIS — G4733 Obstructive sleep apnea (adult) (pediatric): Secondary | ICD-10-CM | POA: Diagnosis not present

## 2017-09-21 DIAGNOSIS — G4733 Obstructive sleep apnea (adult) (pediatric): Secondary | ICD-10-CM | POA: Insufficient documentation

## 2017-09-21 DIAGNOSIS — Z9989 Dependence on other enabling machines and devices: Secondary | ICD-10-CM | POA: Diagnosis not present

## 2017-09-23 ENCOUNTER — Other Ambulatory Visit: Payer: Self-pay | Admitting: *Deleted

## 2017-09-23 DIAGNOSIS — Z7189 Other specified counseling: Secondary | ICD-10-CM | POA: Diagnosis not present

## 2017-09-23 DIAGNOSIS — F419 Anxiety disorder, unspecified: Secondary | ICD-10-CM | POA: Diagnosis not present

## 2017-09-23 MED ORDER — SPIRONOLACTONE 25 MG PO TABS: 12.5000 mg | ORAL_TABLET | Freq: Every day | ORAL | 3 refills | Status: DC

## 2017-09-27 DIAGNOSIS — E785 Hyperlipidemia, unspecified: Secondary | ICD-10-CM | POA: Diagnosis not present

## 2017-09-27 DIAGNOSIS — E669 Obesity, unspecified: Secondary | ICD-10-CM | POA: Diagnosis not present

## 2017-09-27 DIAGNOSIS — E1165 Type 2 diabetes mellitus with hyperglycemia: Secondary | ICD-10-CM | POA: Diagnosis not present

## 2017-09-27 DIAGNOSIS — I10 Essential (primary) hypertension: Secondary | ICD-10-CM | POA: Diagnosis not present

## 2017-10-11 DIAGNOSIS — G4733 Obstructive sleep apnea (adult) (pediatric): Secondary | ICD-10-CM | POA: Diagnosis not present

## 2017-10-26 ENCOUNTER — Ambulatory Visit (INDEPENDENT_AMBULATORY_CARE_PROVIDER_SITE_OTHER): Payer: PPO | Admitting: Internal Medicine

## 2017-10-26 ENCOUNTER — Encounter: Payer: Self-pay | Admitting: Internal Medicine

## 2017-10-26 VITALS — BP 122/78 | HR 66 | Temp 97.6°F | Resp 18 | Ht 74.0 in | Wt 287.2 lb

## 2017-10-26 DIAGNOSIS — E1165 Type 2 diabetes mellitus with hyperglycemia: Secondary | ICD-10-CM | POA: Diagnosis not present

## 2017-10-26 DIAGNOSIS — I10 Essential (primary) hypertension: Secondary | ICD-10-CM | POA: Diagnosis not present

## 2017-10-26 DIAGNOSIS — E1159 Type 2 diabetes mellitus with other circulatory complications: Secondary | ICD-10-CM | POA: Diagnosis not present

## 2017-10-26 LAB — BASIC METABOLIC PANEL
BUN: 16 mg/dL (ref 6–23)
CHLORIDE: 99 meq/L (ref 96–112)
CO2: 30 meq/L (ref 19–32)
CREATININE: 0.87 mg/dL (ref 0.40–1.50)
Calcium: 9.4 mg/dL (ref 8.4–10.5)
GFR: 93.26 mL/min (ref >60.00)
GLUCOSE: 382 mg/dL — AB (ref 70–99)
Potassium: 4.4 mEq/L (ref 3.5–5.1)
Sodium: 135 mEq/L (ref 135–145)

## 2017-10-26 LAB — HEMOGLOBIN A1C: Hgb A1c MFr Bld: 13.4 % — ABNORMAL HIGH (ref 4.6–6.5)

## 2017-10-26 MED ORDER — INSULIN REGULAR HUMAN 100 UNIT/ML IJ SOLN: INTRAMUSCULAR | 0 refills | Status: DC

## 2017-10-26 NOTE — Assessment & Plan Note (Signed)
DM: Since the last visit with endo, was referred to a bariatric clinic, he is actually doing great with diet and exercise, has lost approximately 20 pounds.  Reportedly ran out of NovoLog and Tyler Aas so is now taking a [employer] rapid acting insulin 3 times a day.  Dose?. CBGs remain in the 200s.  Good compliance with metformin. Plan: Continue w/ better lifestyle, continue metformin, insulin as above.  CBGs usually in the 200s, check a A1c, will see the bariatric  surgeon soon. HTN: Seems well controlled on carvedilol, losartan, Aldactone, potassium.  Check a BMP OSA: New diagnosis, on CPAP. RTC, CPX, 4 months

## 2017-10-26 NOTE — Progress Notes (Signed)
Pre visit review using our clinic review tool, if applicable. No additional management support is needed unless otherwise documented below in the visit note. 

## 2017-10-26 NOTE — Patient Instructions (Signed)
GO TO THE LAB : Get the blood work     GO TO THE FRONT DESK Schedule your next appointment for a  Physical exam in 4 months    

## 2017-10-26 NOTE — Progress Notes (Signed)
Subjective:    Patient ID: Randy Castro., male    DOB: January 25, 1951, 67 y.o.   MRN: [id number]  DOS:  10/26/2017 Type of visit - description : rov Interval history: DM: Since the last visit with endocrinology, A1c was 11, he was referred to the bariatric surgery office.  Surgery is pending but he has already changed his lifestyle. Currently taking only a "[employer]" insulin. Was diagnosed with sleep apnea, good compliance with CPAP. On citalopram, symptoms controlled. HTN: Good med compliance, normal ambulatory BPs  Wt Readings from Last 3 Encounters:  10/26/17 287 lb 4 oz (130.3 kg)  05/27/17 296 lb (134.3 kg)  04/27/17 296 lb (134.3 kg)     Review of Systems   Past Medical History:  Diagnosis Date  . Arthritis   . CAD (coronary artery disease)    a. NSTEMI 12/12: EF 40-45%. PXT:GGYIRSW balloon PTCA + Promus DES x 1 to mid LAD;  b. 11/2012: Cath with Cutting balloon PTCA  LAD for ISR to LAD, EF 55%;  c. 12/2012 Cath/PCI: LAD stents patent, 80 ost Diag (jailed)->PTCA, LCX/RCA patent.  . Essential hypertension   . Hyperlipidemia   . Ischemic cardiomyopathy    a.  echo 08/06/11: dist ant wall, apical and septal and infero-apical HK, mild LVH, EF 40%, mild LAE, PASP 34, asc aorta mildly dilated, mild TR;  b.  Echo (3/13) showed recovery of LV systolic function with EF 54-62%, grade I diastolic dysfunction, mild MR.   . Type 2 diabetes mellitus ([place])     Past Surgical History:  Procedure Laterality Date  . ANTERIOR CERVICAL DECOMP/DISCECTOMY FUSION  in the 90s  . CORONARY ANGIOPLASTY  12/15/2012; 12/22/2012  . CORONARY ANGIOPLASTY WITH STENT PLACEMENT  07/2011   "1" (12/22/2012)  . KNEE ARTHROSCOPY Left 12/31/2016  . LEFT HEART CATHETERIZATION WITH CORONARY ANGIOGRAM N/A 08/06/2011   Procedure: LEFT HEART CATHETERIZATION WITH CORONARY ANGIOGRAM;  Surgeon: Burnell Blanks, MD;  Location: [facility] CATH LAB;  Service: Cardiovascular;  Laterality: N/A;  . LEFT HEART CATHETERIZATION WITH  CORONARY ANGIOGRAM N/A 12/15/2012   Procedure: LEFT HEART CATHETERIZATION WITH CORONARY ANGIOGRAM;  Surgeon: Sherren Mocha, MD;  Location: [facility] CATH LAB;  Service: Cardiovascular;  Laterality: N/A;  . LEFT HEART CATHETERIZATION WITH CORONARY ANGIOGRAM N/A 12/22/2012   Procedure: LEFT HEART CATHETERIZATION WITH CORONARY ANGIOGRAM;  Surgeon: Sherren Mocha, MD;  Location: [facility] CATH LAB;  Service: Cardiovascular;  Laterality: N/A;  . PERCUTANEOUS CORONARY STENT INTERVENTION (PCI-S) Right 12/15/2012   Procedure: PERCUTANEOUS CORONARY STENT INTERVENTION (PCI-S);  Surgeon: Sherren Mocha, MD;  Location: [facility] CATH LAB;  Service: Cardiovascular;  Laterality: Right;    Social History   Socioeconomic History  . Marital status: Married    Spouse name: Not on file  . Number of children: 2   . Years of education: Not on file  . Highest education level: Not on file  Social Needs  . Financial resource strain: Not on file  . Food insecurity - worry: Not on file  . Food insecurity - inability: Not on file  . Transportation needs - medical: Not on file  . Transportation needs - non-medical: Not on file  Occupational History  . Occupation: Audiological scientist  Tobacco Use  . Smoking status: Former Smoker    Packs/day: 1.00    Years: 25.00    Pack years: 25.00    Types: Cigarettes    Last attempt to quit: 04/12/1992    Years since quitting: 25.5  . Smokeless tobacco: Never Used  Substance and Sexual Activity  . Alcohol use: Yes    Comment: 12/22/2012 "rarely maybe every 3-4 months, beer"  . Drug use: No  . Sexual activity: Yes  Other Topics Concern  . Not on file  Social History Narrative   Moved back to [place] from [place]-- pt and wife      Allergies as of 10/26/2017      Reactions   Trulicity [dulaglutide] Nausea And Vomiting      Medication List        Accurate as of 10/26/17  6:14 PM. Always use your most recent med list.          acetaminophen 500 MG tablet Commonly known  as:  TYLENOL Take 500 mg by mouth every 6 (six) hours as needed.   aspirin EC 81 MG tablet Take 81 mg by mouth daily.   atorvastatin 80 MG tablet Commonly known as:  LIPITOR Take 1 tablet (80 mg total) by mouth daily.   azelastine 0.1 % nasal spray Commonly known as:  ASTELIN Place 1-2 sprays into both nostrils 2 (two) times daily. Use in each nostril as directed   BD PEN NEEDLE NANO U/F 32G X 4 MM Misc Generic drug:  Insulin Pen Needle USE FOR INJECTIONS FOUR TIMES DAILY   carvedilol 12.5 MG tablet Commonly known as:  COREG TAKE 1 TABLET BY MOUTH TWICE DAILY   citalopram 20 MG tablet Commonly known as:  CELEXA Take 1 tablet (20 mg total) daily by mouth.   clopidogrel 75 MG tablet Commonly known as:  PLAVIX TAKE 1 TABLET BY MOUTH EVERY DAY   clotrimazole-betamethasone cream Commonly known as:  LOTRISONE Apply topically 2 (two) times daily.   famotidine 20 MG tablet Commonly known as:  PEPCID Take 20 mg by mouth 2 (two) times daily as needed for heartburn.   fenofibrate 145 MG tablet Commonly known as:  TRICOR TAKE 1 TABLET BY MOUTH EVERY DAY   glucose blood test strip Use 3x a day - for OneTouch Ultra mini   insulin regular 100 units/mL injection Commonly known as:  NOVOLIN R RELION TID after meals, pt not sure # of units   losartan 100 MG tablet Commonly known as:  COZAAR Take 1 tablet (100 mg total) by mouth daily.   Magnesium 250 MG Tabs Take 1 tablet by mouth.   metFORMIN 500 MG 24 hr tablet Commonly known as:  GLUCOPHAGE-XR Take 1 tablet (500 mg total) by mouth 3 (three) times daily with meals.   multivitamin with minerals Tabs tablet Take 1 tablet by mouth daily.   nitroGLYCERIN 0.4 MG SL tablet Commonly known as:  NITROSTAT Place 1 tablet (0.4 mg total) under the tongue every 5 (five) minutes as needed for chest pain.   omega-3 acid ethyl esters 1 g capsule Commonly known as:  LOVAZA Take 2 capsules (2 g total) by mouth 2 (two) times  daily.   Potassium 99 MG Tabs Take 1 tablet by mouth daily.   spironolactone 25 MG tablet Commonly known as:  ALDACTONE Take 0.5 tablets (12.5 mg total) by mouth daily.          Objective:   Physical Exam BP 122/78 (BP Location: Left Arm, Patient Position: Sitting, Cuff Size: Normal)   Pulse 66   Temp 97.6 F (36.4 C) (Oral)   Resp 18   Ht 6\' 2"  (1.88 m)   Wt 287 lb 4 oz (130.3 kg)   SpO2 95%   BMI 36.88  kg/m  General:   Well developed, obese appearing, NAD.  HEENT:  Normocephalic . Face symmetric, atraumatic Lungs:  CTA B Normal respiratory effort, no intercostal retractions, no accessory muscle use. Heart: RRR,  no murmur.  No pretibial edema bilaterally  Skin: Not pale. Not jaundice Neurologic:  alert & oriented X3.  Speech normal, gait appropriate for age and unassisted Psych--  Cognition and judgment appear intact.  Cooperative with normal attention span and concentration.  Behavior appropriate. No anxious or depressed appearing.      Assessment & Plan:   Assessment   DM -- insulin dependent, uncontrolled, with complications, CAD CHF. Dr Cruzita Lederer HTN Hyperlipidemia CAD, CHF, ischemic cardiomyopathy NSTEMI in 12/12, unstable angina in 4/14 and 5/14. He had DES to mid LAD in 12/12. EF was 40% with apical hypokinesis at the time of the MI. Repeat echo in 3/13 showed EF 55-60%. In 4/14, he was admitted with unstable angina and had 90% pLAD in-stent restenosis. This was treated with cutting balloon angioplasty. Readmitted, again with unstable angina, in 5/14. This time he had PTCA to 90% ostial D1 stenosis.  OSA dx ~08-2017, om Cpap Anxiety-depression Skin cancer: Right leg, status post excision 2016; Dr Allyson Sabal  PLAN: DM: Since the last visit with endo, was referred to a bariatric clinic, he is actually doing great with diet and exercise, has lost approximately 20 pounds.  Reportedly ran out of NovoLog and Tyler Aas so is now taking a [employer] rapid acting  insulin 3 times a day.  Dose?. CBGs remain in the 200s.  Good compliance with metformin. Plan: Continue w/ better lifestyle, continue metformin, insulin as above.  CBGs usually in the 200s, check a A1c, will see the bariatric  surgeon soon. HTN: Seems well controlled on carvedilol, losartan, Aldactone, potassium.  Check a BMP OSA: New diagnosis, on CPAP. RTC, CPX, 4 months

## 2017-10-27 DIAGNOSIS — E11649 Type 2 diabetes mellitus with hypoglycemia without coma: Secondary | ICD-10-CM | POA: Diagnosis not present

## 2017-10-27 DIAGNOSIS — E669 Obesity, unspecified: Secondary | ICD-10-CM | POA: Diagnosis not present

## 2017-10-27 DIAGNOSIS — I10 Essential (primary) hypertension: Secondary | ICD-10-CM | POA: Diagnosis not present

## 2017-11-08 DIAGNOSIS — I10 Essential (primary) hypertension: Secondary | ICD-10-CM | POA: Diagnosis not present

## 2017-11-08 DIAGNOSIS — E1165 Type 2 diabetes mellitus with hyperglycemia: Secondary | ICD-10-CM | POA: Diagnosis not present

## 2017-11-08 DIAGNOSIS — E785 Hyperlipidemia, unspecified: Secondary | ICD-10-CM | POA: Diagnosis not present

## 2017-11-08 DIAGNOSIS — E119 Type 2 diabetes mellitus without complications: Secondary | ICD-10-CM | POA: Diagnosis not present

## 2017-11-08 DIAGNOSIS — E669 Obesity, unspecified: Secondary | ICD-10-CM | POA: Diagnosis not present

## 2017-11-08 DIAGNOSIS — G4733 Obstructive sleep apnea (adult) (pediatric): Secondary | ICD-10-CM | POA: Diagnosis not present

## 2017-11-14 ENCOUNTER — Encounter (HOSPITAL_COMMUNITY): Payer: Self-pay

## 2017-11-14 ENCOUNTER — Emergency Department (HOSPITAL_COMMUNITY): Payer: PPO

## 2017-11-14 ENCOUNTER — Other Ambulatory Visit: Payer: Self-pay

## 2017-11-14 ENCOUNTER — Observation Stay (HOSPITAL_COMMUNITY)
Admission: EM | Admit: 2017-11-14 | Discharge: 2017-11-15 | Disposition: A | Discharge status: Home / Self Care | Payer: PPO | Attending: Internal Medicine | Admitting: Internal Medicine

## 2017-11-14 DIAGNOSIS — E1165 Type 2 diabetes mellitus with hyperglycemia: Secondary | ICD-10-CM | POA: Diagnosis present

## 2017-11-14 DIAGNOSIS — E876 Hypokalemia: Secondary | ICD-10-CM | POA: Insufficient documentation

## 2017-11-14 DIAGNOSIS — I2510 Atherosclerotic heart disease of native coronary artery without angina pectoris: Secondary | ICD-10-CM | POA: Diagnosis not present

## 2017-11-14 DIAGNOSIS — F32A Depression, unspecified: Secondary | ICD-10-CM | POA: Diagnosis present

## 2017-11-14 DIAGNOSIS — Z7982 Long term (current) use of aspirin: Secondary | ICD-10-CM | POA: Insufficient documentation

## 2017-11-14 DIAGNOSIS — I493 Ventricular premature depolarization: Secondary | ICD-10-CM | POA: Diagnosis not present

## 2017-11-14 DIAGNOSIS — L918 Other hypertrophic disorders of the skin: Secondary | ICD-10-CM | POA: Diagnosis not present

## 2017-11-14 DIAGNOSIS — I10 Essential (primary) hypertension: Secondary | ICD-10-CM | POA: Diagnosis present

## 2017-11-14 DIAGNOSIS — D649 Anemia, unspecified: Secondary | ICD-10-CM | POA: Diagnosis present

## 2017-11-14 DIAGNOSIS — Z7901 Long term (current) use of anticoagulants: Secondary | ICD-10-CM | POA: Insufficient documentation

## 2017-11-14 DIAGNOSIS — E1159 Type 2 diabetes mellitus with other circulatory complications: Secondary | ICD-10-CM | POA: Diagnosis present

## 2017-11-14 DIAGNOSIS — F419 Anxiety disorder, unspecified: Secondary | ICD-10-CM | POA: Diagnosis present

## 2017-11-14 DIAGNOSIS — D229 Melanocytic nevi, unspecified: Secondary | ICD-10-CM

## 2017-11-14 DIAGNOSIS — E119 Type 2 diabetes mellitus without complications: Secondary | ICD-10-CM | POA: Insufficient documentation

## 2017-11-14 DIAGNOSIS — I499 Cardiac arrhythmia, unspecified: Secondary | ICD-10-CM | POA: Diagnosis present

## 2017-11-14 DIAGNOSIS — Z9989 Dependence on other enabling machines and devices: Secondary | ICD-10-CM

## 2017-11-14 DIAGNOSIS — Z794 Long term (current) use of insulin: Secondary | ICD-10-CM | POA: Insufficient documentation

## 2017-11-14 DIAGNOSIS — G4733 Obstructive sleep apnea (adult) (pediatric): Secondary | ICD-10-CM

## 2017-11-14 DIAGNOSIS — Z87891 Personal history of nicotine dependence: Secondary | ICD-10-CM | POA: Insufficient documentation

## 2017-11-14 DIAGNOSIS — D591 Other autoimmune hemolytic anemias: Secondary | ICD-10-CM | POA: Diagnosis not present

## 2017-11-14 DIAGNOSIS — E785 Hyperlipidemia, unspecified: Secondary | ICD-10-CM | POA: Diagnosis present

## 2017-11-14 DIAGNOSIS — Z79899 Other long term (current) drug therapy: Secondary | ICD-10-CM | POA: Insufficient documentation

## 2017-11-14 DIAGNOSIS — R0989 Other specified symptoms and signs involving the circulatory and respiratory systems: Secondary | ICD-10-CM | POA: Diagnosis not present

## 2017-11-14 DIAGNOSIS — F329 Major depressive disorder, single episode, unspecified: Secondary | ICD-10-CM | POA: Diagnosis present

## 2017-11-14 DIAGNOSIS — R233 Spontaneous ecchymoses: Secondary | ICD-10-CM | POA: Diagnosis present

## 2017-11-14 LAB — BASIC METABOLIC PANEL
Anion gap: 10 (ref 5–15)
BUN: 16 mg/dL (ref 6–20)
CHLORIDE: 105 mmol/L (ref 101–111)
CO2: 24 mmol/L (ref 22–32)
CREATININE: 0.9 mg/dL (ref 0.61–1.24)
Calcium: 9.3 mg/dL (ref 8.9–10.3)
GFR calc non Af Amer: >60 mL/min (ref >60)
GLUCOSE: 251 mg/dL — AB (ref 65–99)
Potassium: 3.8 mmol/L (ref 3.5–5.1)
Sodium: 139 mmol/L (ref 135–145)

## 2017-11-14 LAB — CBC WITH DIFFERENTIAL/PLATELET
Basophils Absolute: 0.1 10*3/uL (ref 0.0–0.1)
Basophils Relative: 1 %
EOS PCT: 4 %
Eosinophils Absolute: 0.3 10*3/uL (ref 0.0–0.7)
HCT: 36.6 % — ABNORMAL LOW (ref 39.0–52.0)
Hemoglobin: 11.8 g/dL — ABNORMAL LOW (ref 13.0–17.0)
LYMPHS ABS: 1.8 10*3/uL (ref 0.7–4.0)
LYMPHS PCT: 27 %
MCH: 27.8 pg (ref 26.0–34.0)
MCHC: 32.2 g/dL (ref 30.0–36.0)
MCV: 86.1 fL (ref 78.0–100.0)
MONOS PCT: 10 %
Monocytes Absolute: 0.7 10*3/uL (ref 0.1–1.0)
Neutro Abs: 3.7 10*3/uL (ref 1.7–7.7)
Neutrophils Relative %: 58 %
PLATELETS: 239 10*3/uL (ref 150–400)
RBC: 4.25 MIL/uL (ref 4.22–5.81)
RDW: 14.9 % (ref 11.5–15.5)
WBC: 6.5 10*3/uL (ref 4.0–10.5)

## 2017-11-14 LAB — MAGNESIUM: Magnesium: 1.4 mg/dL — ABNORMAL LOW (ref 1.7–2.4)

## 2017-11-14 LAB — PROTIME-INR
INR: 0.97
Prothrombin Time: 12.8 seconds (ref 11.4–15.2)

## 2017-11-14 LAB — TROPONIN I: Troponin I: <0.03 ng/mL (ref <0.03)

## 2017-11-14 LAB — GLUCOSE, CAPILLARY: GLUCOSE-CAPILLARY: 160 mg/dL — AB (ref 65–99)

## 2017-11-14 LAB — PHOSPHORUS: Phosphorus: 2.8 mg/dL (ref 2.5–4.6)

## 2017-11-14 MED ORDER — INSULIN ASPART 100 UNIT/ML ~~LOC~~ SOLN: 35.0000 [IU] | Freq: Every day | SUBCUTANEOUS | Status: DC

## 2017-11-14 MED ORDER — NITROGLYCERIN 0.4 MG SL SUBL: 0.4000 mg | SUBLINGUAL_TABLET | SUBLINGUAL | Status: DC | PRN

## 2017-11-14 MED ORDER — POTASSIUM CHLORIDE CRYS ER 20 MEQ PO TBCR
40.0000 meq | EXTENDED_RELEASE_TABLET | Freq: Once | ORAL | Status: AC
  Administered 2017-11-14: 40 meq via ORAL
  Filled 2017-11-14: qty 2

## 2017-11-14 MED ORDER — FAMOTIDINE 20 MG PO TABS: 20.0000 mg | ORAL_TABLET | Freq: Two times a day (BID) | ORAL | Status: DC | PRN

## 2017-11-14 MED ORDER — SPIRONOLACTONE 25 MG PO TABS
12.5000 mg | ORAL_TABLET | Freq: Every day | ORAL | Status: DC
  Administered 2017-11-15: 12.5 mg via ORAL
  Filled 2017-11-14: qty 0.5
  Filled 2017-11-14: qty 1
  Filled 2017-11-14 (×3): qty 0.5

## 2017-11-14 MED ORDER — ONDANSETRON HCL 4 MG PO TABS: 4.0000 mg | ORAL_TABLET | Freq: Four times a day (QID) | ORAL | Status: DC | PRN

## 2017-11-14 MED ORDER — CITALOPRAM HYDROBROMIDE 20 MG PO TABS
20.0000 mg | ORAL_TABLET | Freq: Every day | ORAL | Status: DC
  Administered 2017-11-15: 20 mg via ORAL
  Filled 2017-11-14: qty 1

## 2017-11-14 MED ORDER — ACETAMINOPHEN 325 MG PO TABS: 650.0000 mg | ORAL_TABLET | Freq: Four times a day (QID) | ORAL | Status: DC | PRN

## 2017-11-14 MED ORDER — MAGNESIUM SULFATE 2 GM/50ML IV SOLN
2.0000 g | Freq: Once | INTRAVENOUS | Status: AC
  Administered 2017-11-14: 2 g via INTRAVENOUS
  Filled 2017-11-14: qty 50

## 2017-11-14 MED ORDER — INSULIN NPH (HUMAN) (ISOPHANE) 100 UNIT/ML ~~LOC~~ SUSP
45.0000 [IU] | Freq: Two times a day (BID) | SUBCUTANEOUS | Status: DC
  Administered 2017-11-14 – 2017-11-15 (×2): 45 [IU] via SUBCUTANEOUS
  Filled 2017-11-14 (×2): qty 10

## 2017-11-14 MED ORDER — ASPIRIN EC 81 MG PO TBEC
81.0000 mg | DELAYED_RELEASE_TABLET | Freq: Every day | ORAL | Status: DC
  Administered 2017-11-15: 81 mg via ORAL
  Filled 2017-11-14: qty 1

## 2017-11-14 MED ORDER — FENOFIBRATE 160 MG PO TABS
160.0000 mg | ORAL_TABLET | Freq: Every day | ORAL | Status: DC
Start: 2017-11-15 — End: 2017-11-15
  Administered 2017-11-15: 160 mg via ORAL
  Filled 2017-11-14: qty 1

## 2017-11-14 MED ORDER — ATORVASTATIN CALCIUM 40 MG PO TABS
80.0000 mg | ORAL_TABLET | Freq: Every day | ORAL | Status: DC
  Administered 2017-11-14 – 2017-11-15 (×2): 80 mg via ORAL
  Filled 2017-11-14 (×2): qty 1
  Filled 2017-11-14 (×2): qty 2

## 2017-11-14 MED ORDER — URSODIOL 300 MG PO CAPS
300.0000 mg | ORAL_CAPSULE | Freq: Every day | ORAL | Status: DC
  Filled 2017-11-14 (×2): qty 1

## 2017-11-14 MED ORDER — METFORMIN HCL ER 500 MG PO TB24
500.0000 mg | ORAL_TABLET | Freq: Three times a day (TID) | ORAL | Status: DC
  Administered 2017-11-14 – 2017-11-15 (×3): 500 mg via ORAL
  Filled 2017-11-14 (×3): qty 1

## 2017-11-14 MED ORDER — LOSARTAN POTASSIUM 25 MG PO TABS
100.0000 mg | ORAL_TABLET | Freq: Every day | ORAL | Status: DC
  Administered 2017-11-15: 100 mg via ORAL
  Filled 2017-11-14: qty 4

## 2017-11-14 MED ORDER — PANTOPRAZOLE SODIUM 40 MG PO TBEC: 40.0000 mg | DELAYED_RELEASE_TABLET | Freq: Every day | ORAL | Status: DC

## 2017-11-14 MED ORDER — ONDANSETRON HCL 4 MG/2ML IJ SOLN: 4.0000 mg | Freq: Four times a day (QID) | INTRAMUSCULAR | Status: DC | PRN

## 2017-11-14 MED ORDER — AZELASTINE HCL 0.1 % NA SOLN
1.0000 | Freq: Two times a day (BID) | NASAL | Status: DC
  Administered 2017-11-14: 1 via NASAL
  Administered 2017-11-15: 2 via NASAL
  Filled 2017-11-14 (×2): qty 30

## 2017-11-14 MED ORDER — CARVEDILOL 12.5 MG PO TABS
12.5000 mg | ORAL_TABLET | Freq: Two times a day (BID) | ORAL | Status: DC
  Administered 2017-11-14 – 2017-11-15 (×2): 12.5 mg via ORAL
  Filled 2017-11-14 (×2): qty 1

## 2017-11-14 MED ORDER — MAGNESIUM OXIDE 400 (241.3 MG) MG PO TABS
200.0000 mg | ORAL_TABLET | Freq: Two times a day (BID) | ORAL | Status: DC
  Administered 2017-11-15: 200 mg via ORAL
  Filled 2017-11-14 (×2): qty 1

## 2017-11-14 MED ORDER — URSODIOL 300 MG PO CAPS
ORAL_CAPSULE | ORAL | Status: AC
  Filled 2017-11-14: qty 1

## 2017-11-14 MED ORDER — OMEGA-3-ACID ETHYL ESTERS 1 G PO CAPS
2.0000 | ORAL_CAPSULE | Freq: Two times a day (BID) | ORAL | Status: DC
  Administered 2017-11-14 – 2017-11-15 (×2): 2 g via ORAL
  Filled 2017-11-14 (×2): qty 2

## 2017-11-14 MED ORDER — INSULIN ASPART 100 UNIT/ML ~~LOC~~ SOLN: 0.0000 [IU] | Freq: Three times a day (TID) | SUBCUTANEOUS | Status: DC

## 2017-11-14 MED ORDER — INSULIN ASPART 100 UNIT/ML ~~LOC~~ SOLN
60.0000 [IU] | Freq: Every day | SUBCUTANEOUS | Status: DC
  Administered 2017-11-15: 60 [IU] via SUBCUTANEOUS

## 2017-11-14 MED ORDER — CLOPIDOGREL BISULFATE 75 MG PO TABS
75.0000 mg | ORAL_TABLET | Freq: Every day | ORAL | Status: DC
  Administered 2017-11-15: 75 mg via ORAL
  Filled 2017-11-14: qty 1

## 2017-11-14 MED ORDER — MAGNESIUM 250 MG PO TABS: 1.0000 | ORAL_TABLET | Freq: Two times a day (BID) | ORAL | Status: DC

## 2017-11-14 MED ORDER — INSULIN ASPART 100 UNIT/ML ~~LOC~~ SOLN
45.0000 [IU] | Freq: Every day | SUBCUTANEOUS | Status: DC
  Administered 2017-11-15: 45 [IU] via SUBCUTANEOUS

## 2017-11-14 NOTE — H&P (Signed)
History and Physical    Randy Castro. IDP:[id number] DOB: Sep 06, 1950 DOA: 11/14/2017  PCP: Colon Branch, MD   Patient coming from: Home.  I have personally briefly reviewed patient's old medical records in [place]  Chief Complaint: Bleeding skin tag.  HPI: Randy Castro. is a 67 y.o. male with medical history significant of osteoarthritis, CAD, NSTEMI, history of stent placement to LAD in 2012, history of PTCA on LAD on May 2014, ischemic cardiomyopathy, hypertension, hyperlipidemia, type 2 diabetes, morbid obesity, OSA on CPAP who is coming to the emergency department with complaints of having a skin tab persistently bleeding since the morning.  He takes aspirin and clopidogrel for CAD at home.  He mentioned that he has been unable to stop the bleeding despite applying pressure for some time.  He denies any trauma or manipulation to the area.  No fever, chills, sore throat, cough, hemoptysis, chest pain, palpitations, dizziness, diaphoresis, PND or orthopnea.  He occasionally gets mild lower extremity edema.  No abdominal pain, nausea, vomiting, diarrhea, constipation, melena or hematochezia.  Denies dysuria, frequency or hematuria.  No polyphagia, polyuria, polydipsia or blurred vision.  No heat or cold intolerance.  ED Course: Initial vital signs temperature 36.9C (98.4 F) pulse 66, respirations 18, blood pressure 160/43 mmHg and O2 sat 97%.  He was given 4 g of magnesium sulfate in the ED.  Workup shows an EKG showing sinus tach at 105 bpm with prior PVCs, LVH with IVCD and secondary repolarization abnormality.  There is anterior ST elevation.  Positive anterior ST elevation, which could be related to LVH.  When compared to previous, diffuse T wave abnormality is is seen now more anteriorly.  Troponin level #1 was normal.  His white count was 6.5 with a normal differential., hemoglobin 11.8 g/dL (14.2 g/dL on 08/03/2016) and platelets 239.  PT 12.8 seconds and INR 0.97. Sodium  139, potassium 3.8, chloride 105 and CO2 24 mmol/L.  BUN was 16, creatinine 0.90, glucose 251, magnesium 1.4 and phosphorus 2.8 mg/dL.  His chest radiographs show borderline cardiomegaly with vascular congestion.  There was a radiopaque foreign body on his left supraclavicular area.  Please see images and full radiology report for further detail.  Review of Systems: As per HPI otherwise 10 point review of systems negative.    Past Medical History:  Diagnosis Date  . Arthritis   . CAD (coronary artery disease)    a. NSTEMI 12/12: EF 40-45%. [id number] balloon PTCA + Promus DES x 1 to mid LAD;  b. 11/2012: Cath with [place] balloon PTCA  LAD for ISR to LAD, EF 55%;  c. 12/2012 Cath/PCI: LAD stents patent, 80 ost Diag (jailed)->PTCA, LCX/RCA patent.  . Essential hypertension   . Hyperlipidemia   . Ischemic cardiomyopathy    a.  echo 08/06/11: dist ant wall, apical and septal and infero-apical HK, mild LVH, EF 40%, mild LAE, PASP 34, asc aorta mildly dilated, mild TR;  b.  Echo (3/13) showed recovery of LV systolic function with EF 76-19%, grade I diastolic dysfunction, mild MR.   . OSA on CPAP Type 2 diabetes mellitus ([place])     Past Surgical History:  Procedure Laterality Date  . ANTERIOR CERVICAL DECOMP/DISCECTOMY FUSION  in the 90s  . CORONARY ANGIOPLASTY  12/15/2012; 12/22/2012  . CORONARY ANGIOPLASTY WITH STENT PLACEMENT  07/2011   "1" (12/22/2012)  . KNEE ARTHROSCOPY Left 12/31/2016  . LEFT HEART CATHETERIZATION WITH CORONARY ANGIOGRAM N/A 08/06/2011   Procedure: LEFT  HEART CATHETERIZATION WITH CORONARY ANGIOGRAM;  Surgeon: Burnell Blanks, MD;  Location: [facility] CATH LAB;  Service: Cardiovascular;  Laterality: N/A;  . LEFT HEART CATHETERIZATION WITH CORONARY ANGIOGRAM N/A 12/15/2012   Procedure: LEFT HEART CATHETERIZATION WITH CORONARY ANGIOGRAM;  Surgeon: Sherren Mocha, MD;  Location: [facility] CATH LAB;  Service: Cardiovascular;  Laterality: N/A;  . LEFT HEART CATHETERIZATION WITH CORONARY  ANGIOGRAM N/A 12/22/2012   Procedure: LEFT HEART CATHETERIZATION WITH CORONARY ANGIOGRAM;  Surgeon: Sherren Mocha, MD;  Location: [facility] CATH LAB;  Service: Cardiovascular;  Laterality: N/A;  . PERCUTANEOUS CORONARY STENT INTERVENTION (PCI-S) Right 12/15/2012   Procedure: PERCUTANEOUS CORONARY STENT INTERVENTION (PCI-S);  Surgeon: Sherren Mocha, MD;  Location: [facility] CATH LAB;  Service: Cardiovascular;  Laterality: Right;     reports that he quit smoking about 25 years ago. His smoking use included cigarettes. He has a 25.00 pack-year smoking history. He has never used smokeless tobacco. He reports that he drinks alcohol. He reports that he does not use drugs.  Allergies  Allergen Reactions  . Trulicity [Dulaglutide] Nausea And Vomiting    Family History  Problem Relation Age of Onset  . Diabetes Brother   . Lung cancer Brother   . Diabetes Mother   . Coronary artery disease Father   . Heart attack Father 59  . Diabetes Sister   . Colon cancer Neg Hx   . Prostate cancer Neg Hx   . Stroke Neg Hx     Prior to Admission medications   Medication Sig Start Date End Date Taking? Authorizing Provider  acetaminophen (TYLENOL) 500 MG tablet Take 500 mg by mouth every 6 (six) hours as needed.   Yes [provider]  aspirin EC 81 MG tablet Take 81 mg by mouth daily.   Yes [provider]  atorvastatin (LIPITOR) 80 MG tablet Take 1 tablet (80 mg total) by mouth daily. 04/28/17  Yes Satira Sark, MD  azelastine (ASTELIN) 0.1 % nasal spray Place 1-2 sprays into both nostrils 2 (two) times daily. Use in each nostril as directed 01/13/17  Yes Paz, Alda Berthold, MD  BD PEN NEEDLE NANO U/F 32G X 4 MM MISC USE FOR INJECTIONS FOUR TIMES DAILY 12/13/15  Yes Philemon Kingdom, MD  carvedilol (COREG) 12.5 MG tablet TAKE 1 TABLET BY MOUTH TWICE DAILY 06/02/17  Yes Satira Sark, MD  citalopram (CELEXA) 20 MG tablet Take 1 tablet (20 mg total) daily by mouth. 07/07/17  Yes Colon Branch, MD    clopidogrel (PLAVIX) 75 MG tablet TAKE 1 TABLET BY MOUTH EVERY DAY 03/16/16  Yes Larey Dresser, MD  clotrimazole-betamethasone (LOTRISONE) cream Apply topically 2 (two) times daily. Patient taking differently: Apply topically 2 (two) times daily as needed.  05/01/16  Yes Paz, Alda Berthold, MD  famotidine (PEPCID) 20 MG tablet Take 20 mg by mouth 2 (two) times daily as needed for heartburn.   Yes [provider]  fenofibrate (TRICOR) 145 MG tablet TAKE 1 TABLET BY MOUTH EVERY DAY 05/24/17  Yes Satira Sark, MD  glucose blood test strip Use 3x a day - for OneTouch Ultra mini 01/21/17  Yes Philemon Kingdom, MD  insulin aspart protamine- aspart (NOVOLOG MIX 70/30) (70-30) 100 UNIT/ML injection Inject 60 Units into the skin daily with breakfast.   Yes [provider]  insulin aspart protamine- aspart (NOVOLOG MIX 70/30) (70-30) 100 UNIT/ML injection Inject 45 Units into the skin daily with supper.   Yes [provider]  insulin regular (NOVOLIN R RELION)  100 units/mL injection TID after meals, pt not sure # of units Patient taking differently: Inject 40-45 Units into the skin 3 (three) times daily before meals. 68 UNITS AT LUNCH AND 45 UNITS AT NIGHT 10/26/17  Yes Paz, Alda Berthold, MD  losartan (COZAAR) 100 MG tablet Take 1 tablet (100 mg total) by mouth daily. 05/24/17  Yes Paz, Alda Berthold, MD  Magnesium 250 MG TABS Take 1 tablet by mouth.   Yes [provider]  metFORMIN (GLUCOPHAGE-XR) 500 MG 24 hr tablet Take 1 tablet (500 mg total) by mouth 3 (three) times daily with meals. 11/13/16  Yes Philemon Kingdom, MD  Multiple Vitamin (MULTIVITAMIN WITH MINERALS) TABS Take 1 tablet by mouth daily.   Yes [provider]  omega-3 acid ethyl esters (LOVAZA) 1 g capsule Take 2 capsules (2 g total) by mouth 2 (two) times daily. 04/29/17  Yes Satira Sark, MD  omeprazole (PRILOSEC) 20 MG capsule Take 1 tablet by mouth twice a day. 11/08/17  Yes [provider]   Potassium 99 MG TABS Take 1 tablet by mouth daily.   Yes [provider]  spironolactone (ALDACTONE) 25 MG tablet Take 0.5 tablets (12.5 mg total) by mouth daily. 09/23/17  Yes Satira Sark, MD  metoCLOPramide (REGLAN) 5 MG tablet Take by mouth. 11/08/17 11/22/17  [provider]  nitroGLYCERIN (NITROSTAT) 0.4 MG SL tablet Place 1 tablet (0.4 mg total) under the tongue every 5 (five) minutes as needed for chest pain. 03/16/16   Larey Dresser, MD  ondansetron (ZOFRAN-ODT) 4 MG disintegrating tablet Take 1 tablet by mouth every 8 (eight) hours as needed for Nausea. For post- op use 11/08/17   [provider]  ursodiol (ACTIGALL) 300 MG capsule Take by mouth. 11/08/17 12/08/17  [provider]    Physical Exam: Vitals:   11/14/17 1935 11/14/17 2030 11/14/17 2100 11/14/17 2122  BP: 129/60 (!) 141/65 (!) 141/66   Pulse:  79 65   Resp: 18 19 20    Temp:   97.8 F (36.6 C)   TempSrc:   Oral   SpO2: 97% 94%  94%  Weight:   133.4 kg (294 lb)   Height:   6\' 2"  (1.88 m)     Constitutional: NAD, calm, comfortable Eyes: PERRL, lids and conjunctivae normal ENMT: Mucous membranes are moist. Posterior pharynx clear of any exudate or lesions. Upper and lower dentures present. Neck: normal, supple, no masses, no thyromegaly Respiratory: Clear to auscultation bilaterally, no wheezing, no crackles. Normal respiratory effort. No accessory muscle use.  Cardiovascular: Irregularly irregular with multiple PVCs per minute, no murmurs / rubs / gallops.  Trace RLE and 1+ LLE pitting edema (patient stated that since his knee surgery and due to OA of left knee, he has a tendency to have more swelling on the left than the right. 2+ pedal pulses. No carotid bruits.  Abdomen: Obese, soft, no tenderness, no masses palpated. No hepatosplenomegaly. Bowel sounds positive.  Musculoskeletal: no clubbing / cyanosis.  Good ROM, no contractures. Normal muscle tone.  Skin: Left  supraclavicular area bleeding lesion.  Please see picture below.  Multiple hyperpigmented and wart like lesions on trunk, particularly upper back. Neurologic: CN 2-12 grossly intact. Sensation intact, DTR normal. Strength 5/5 in all 4.  Psychiatric: Normal judgment and insight. Alert and oriented x 4. Normal mood.    Left bleeding lesion.   Labs on Admission: I have personally reviewed following labs and imaging studies  CBC: Recent Labs  Lab  11/14/17 1722  WBC 6.5  NEUTROABS 3.7  HGB 11.8*  HCT 36.6*  MCV 86.1  PLT 326   Basic Metabolic Panel: Recent Labs  Lab 11/14/17 1722  NA 139  K 3.8  CL 105  CO2 24  GLUCOSE 251*  BUN 16  CREATININE 0.90  CALCIUM 9.3  MG 1.4*  PHOS 2.8   GFR: Estimated Creatinine Clearance: 117.3 mL/min (by C-G formula based on SCr of 0.9 mg/dL). Liver Function Tests: No results for input(s): AST, ALT, ALKPHOS, BILITOT, PROT, ALBUMIN in the last 168 hours. No results for input(s): LIPASE, AMYLASE in the last 168 hours. No results for input(s): AMMONIA in the last 168 hours. Coagulation Profile: Recent Labs  Lab 11/14/17 1722  INR 0.97   Cardiac Enzymes: Recent Labs  Lab 11/14/17 1722  TROPONINI <0.03   BNP (last 3 results) No results for input(s): PROBNP in the last 8760 hours. HbA1C: No results for input(s): HGBA1C in the last 72 hours. CBG: Recent Labs  Lab 11/14/17 2103  GLUCAP 160*   Lipid Profile: No results for input(s): CHOL, HDL, LDLCALC, TRIG, CHOLHDL, LDLDIRECT in the last 72 hours. Thyroid Function Tests: No results for input(s): TSH, T4TOTAL, FREET4, T3FREE, THYROIDAB in the last 72 hours. Anemia Panel: No results for input(s): VITAMINB12, FOLATE, FERRITIN, TIBC, IRON, RETICCTPCT in the last 72 hours. Urine analysis:    Component Value Date/Time   COLORURINE YELLOW 08/06/2011 [place] 08/06/2011 1014   LABSPEC 1.027 08/06/2011 1014   PHURINE 7.0 08/06/2011 1014   GLUCOSEU >1000 (A)  08/06/2011 1014   HGBUR NEGATIVE 08/06/2011 1014   BILIRUBINUR NEGATIVE 08/06/2011 1014   KETONESUR 15 (A) 08/06/2011 1014   PROTEINUR 30 (A) 08/06/2011 1014   UROBILINOGEN 0.2 08/06/2011 1014   NITRITE NEGATIVE 08/06/2011 1014   LEUKOCYTESUR NEGATIVE 08/06/2011 1014    Radiological Exams on Admission: Dg Chest 2 View  Result Date: 11/14/2017 CLINICAL DATA:  Irregular heart rate. EXAM: CHEST - 2 VIEW COMPARISON:  12/22/2012 FINDINGS: The lungs are clear without focal pneumonia, edema, pneumothorax or pleural effusion. Cardiopericardial silhouette is at upper limits of normal for size. There is pulmonary vascular congestion without overt pulmonary edema. Radiopaque foreign body identified in the left supraclavicular region. Telemetry leads overlie the chest. The visualized bony structures of the thorax are intact. IMPRESSION: 1. Upper normal heart size with vascular congestion. 2. Radiopaque foreign body projecting over the left supraclavicular soft tissues. Electronically Signed   By: Misty Stanley M.D.   On: 11/14/2017 17:54    EKG: Independently reviewed. Vent. rate 105 BPM PR interval * ms QRS duration 136 ms QT/QTc 412/415 ms P-R-T axes 63 -23 144 Sinus tachycardia Paired ventricular premature complexes LVH with IVCD and secondary repol abnrm Anterior ST elevation, probably due to LVH  Assessment/Plan Principal Problem:   Cardiac arrhythmia, unspecified Numerous PVCs per minute. Cardiology on call recommended overnight observation and electrolyte replacement. Continue magnesium sulfate 4 g IV PB supplementation. Potassium was normal, but supplemented earlier for optimization purposes. Continue beta-blocker (Coreg 12.5 mg p.o BID). Repeat troponin level per cardiology. Check electrocardiogram in a.m. Consider cardiology evaluation in a.m. if persistent.  Active Problems:   Hypomagnesemia Correcting with 4 g of magnesium sulfate IVPB. Continue cardiac telemetry. Increase  magnesium oxide to 50 mg p.o. from daily to twice daily. Follow-up magnesium level in a.m.    Bleeding skin mole Continue pressure on lesion. Should visit dermatologist to take care of these and no other lesions.  Anemia Likely due to persistent skin tag bleed. Monitor hematocrit and hemoglobin. Continue local pressure on lesion.    Poorly controlled type 2 diabetes mellitus with circulatory disorder ([place]) His most recent hemoglobin A1c was 13.4% on 10/26/2017. Recently seen by endocrinologist at [facility] on 11/08/2017. His recommended regimen is 45 units twice daily of N-insulin. Regular insulin 60 units before breakfast, 45 units before lunch and 35 units before supper.  I will continue his current regimen along with carbohydrate modified diet. Continue metformin 500 mg p.o. 3 times daily. CBG monitoring before each meal and at bedtime.    Hyperlipidemia Continue atorvastatin 80 mg p.o. at bedtime. Monitor LFTs as needed. Lipid panel to be followed as an outpatient by PCP or endocrinology.    HTN (hypertension) Continue carvedilol 12.5 mg p.o. twice daily. Continue Spironolactone 12.5 mg p.o. daily. Monitor blood pressure, heart rate, renal function and electrolytes.    Coronary Artery Disease No recent chest pain or other symptomatology. Continue atorvastatin and beta-blocker. Resume aspirin and clopidogrel in a.m.    Anxiety and depression Continue citalopram 20 mg p.o. daily.    OSA on CPAP Continue nocturnal CPAP     DVT prophylaxis: SCDs. Code Status: Full code. Family Communication: His spouse Dino Borntreger was present in the ED. Disposition Plan: Observation for telemetry monitoring, electrolyte correction and troponin level trending. Consults called:  Admission status: Observation/telemetry.   Reubin Milan MD [facility] Pager [phone].  If 7PM-7AM, please contact night-coverage [email or web address] Password [facility]  11/14/2017,  9:56 PM   This document was prepared using Dragon voice recognition software and may contain some unintended transcription errors.

## 2017-11-14 NOTE — ED Triage Notes (Addendum)
Pt reports that has a skin tag to left upper chest that he cannot get to stop bleeding. States it started bleeding this morning and unsure what caused it to start bleeding. Pt reports he is on plavix. Pt heart rate is 37 in triage

## 2017-11-14 NOTE — ED Provider Notes (Signed)
[facility] EMERGENCY DEPARTMENT Provider Note   CSN: [id number] Arrival date & time: 11/14/17  1621     History   Chief Complaint Chief Complaint  Patient presents with  . skin lesion  . Bradycardia    HPI Randy Saetern. is a 67 y.o. male.  HPI  Pt was seen at [place]. Per pt and his wife, c/o gradual onset and persistence of constant left upper chest wall "bleeding" that was noticed this morning approximately 0900/0930. Pt states the area of bleeding is a known skin tag/mole. Pt denies injury/scratching/etc to that area. Pt is taking ASA and plavix. States the area has been "bleeding all day."   Denies any other complaints. Denies CP/palpitations, no SOB/cough, no abd pain, no N/V/D, no fevers, no rash, no back pain, no generalized or focal motor weakness, no near syncope/lightheadedness.     Past Medical History:  Diagnosis Date  . Arthritis   . CAD (coronary artery disease)    a. NSTEMI 12/12: EF 40-45%. HEN:IDPOEUM balloon PTCA + Promus DES x 1 to mid LAD;  b. 11/2012: Cath with [place] balloon PTCA  LAD for ISR to LAD, EF 55%;  c. 12/2012 Cath/PCI: LAD stents patent, 80 ost Diag (jailed)->PTCA, LCX/RCA patent.  . Essential hypertension   . Hyperlipidemia   . Ischemic cardiomyopathy    a.  echo 08/06/11: dist ant wall, apical and septal and infero-apical HK, mild LVH, EF 40%, mild LAE, PASP 34, asc aorta mildly dilated, mild TR;  b.  Echo (3/13) showed recovery of LV systolic function with EF 35-36%, grade I diastolic dysfunction, mild MR.   . Type 2 diabetes mellitus [facility])     Patient Active Problem List   Diagnosis Date Noted  . OSA on CPAP 09/21/2017  . Obesity 01/23/2016  . PCP NOTES >>> 06/08/2015  . Anxiety and depression 05/31/2013  . Intermediate coronary syndrome ([place]) 12/22/2012  . Unstable angina ([place]) 12/16/2012  . Fatigue 04/26/2012  . Annual physical exam 10/28/2011  . Rash 10/28/2011  . Coronary Artery Disease 08/28/2011  . Ischemic Cardiomyopathy  08/28/2011  . Palpitations 08/28/2011  . NSTEMI (non-ST elevated myocardial infarction) ([place]) 08/07/2011  . DJD (degenerative joint disease) 05/28/2011  . Poorly controlled type 2 diabetes mellitus with circulatory disorder ([place])   . Hyperlipidemia   . HTN (hypertension)     Past Surgical History:  Procedure Laterality Date  . ANTERIOR CERVICAL DECOMP/DISCECTOMY FUSION  in the 90s  . CORONARY ANGIOPLASTY  12/15/2012; 12/22/2012  . CORONARY ANGIOPLASTY WITH STENT PLACEMENT  07/2011   "1" (12/22/2012)  . KNEE ARTHROSCOPY Left 12/31/2016  . LEFT HEART CATHETERIZATION WITH CORONARY ANGIOGRAM N/A 08/06/2011   Procedure: LEFT HEART CATHETERIZATION WITH CORONARY ANGIOGRAM;  Surgeon: Burnell Blanks, MD;  Location: [facility] CATH LAB;  Service: Cardiovascular;  Laterality: N/A;  . LEFT HEART CATHETERIZATION WITH CORONARY ANGIOGRAM N/A 12/15/2012   Procedure: LEFT HEART CATHETERIZATION WITH CORONARY ANGIOGRAM;  Surgeon: Sherren Mocha, MD;  Location: [facility] CATH LAB;  Service: Cardiovascular;  Laterality: N/A;  . LEFT HEART CATHETERIZATION WITH CORONARY ANGIOGRAM N/A 12/22/2012   Procedure: LEFT HEART CATHETERIZATION WITH CORONARY ANGIOGRAM;  Surgeon: Sherren Mocha, MD;  Location: [facility] CATH LAB;  Service: Cardiovascular;  Laterality: N/A;  . PERCUTANEOUS CORONARY STENT INTERVENTION (PCI-S) Right 12/15/2012   Procedure: PERCUTANEOUS CORONARY STENT INTERVENTION (PCI-S);  Surgeon: Sherren Mocha, MD;  Location: [facility] CATH LAB;  Service: Cardiovascular;  Laterality: Right;        Home Medications    Prior to  Admission medications   Medication Sig Start Date End Date Taking? Authorizing Provider  acetaminophen (TYLENOL) 500 MG tablet Take 500 mg by mouth every 6 (six) hours as needed.    [provider]  aspirin EC 81 MG tablet Take 81 mg by mouth daily.    [provider]  atorvastatin (LIPITOR) 80 MG tablet Take 1 tablet (80 mg total) by mouth daily. 04/28/17   Satira Sark, MD    azelastine (ASTELIN) 0.1 % nasal spray Place 1-2 sprays into both nostrils 2 (two) times daily. Use in each nostril as directed 01/13/17   Colon Branch, MD  BD PEN NEEDLE NANO U/F 32G X 4 MM MISC USE FOR INJECTIONS FOUR TIMES DAILY 12/13/15   Philemon Kingdom, MD  carvedilol (COREG) 12.5 MG tablet TAKE 1 TABLET BY MOUTH TWICE DAILY 06/02/17   Satira Sark, MD  citalopram (CELEXA) 20 MG tablet Take 1 tablet (20 mg total) daily by mouth. 07/07/17   Colon Branch, MD  clopidogrel (PLAVIX) 75 MG tablet TAKE 1 TABLET BY MOUTH EVERY DAY 03/16/16   Larey Dresser, MD  clotrimazole-betamethasone (LOTRISONE) cream Apply topically 2 (two) times daily. Patient taking differently: Apply topically 2 (two) times daily as needed.  05/01/16   Colon Branch, MD  famotidine (PEPCID) 20 MG tablet Take 20 mg by mouth 2 (two) times daily as needed for heartburn.    [provider]  fenofibrate (TRICOR) 145 MG tablet TAKE 1 TABLET BY MOUTH EVERY DAY 05/24/17   Satira Sark, MD  glucose blood test strip Use 3x a day - for OneTouch Ultra mini 01/21/17   Philemon Kingdom, MD  insulin regular (NOVOLIN R RELION) 100 units/mL injection TID after meals, pt not sure # of units 10/26/17   Colon Branch, MD  losartan (COZAAR) 100 MG tablet Take 1 tablet (100 mg total) by mouth daily. 05/24/17   Colon Branch, MD  Magnesium 250 MG TABS Take 1 tablet by mouth.    [provider]  metFORMIN (GLUCOPHAGE-XR) 500 MG 24 hr tablet Take 1 tablet (500 mg total) by mouth 3 (three) times daily with meals. 11/13/16   Philemon Kingdom, MD  Multiple Vitamin (MULTIVITAMIN WITH MINERALS) TABS Take 1 tablet by mouth daily.    [provider]  nitroGLYCERIN (NITROSTAT) 0.4 MG SL tablet Place 1 tablet (0.4 mg total) under the tongue every 5 (five) minutes as needed for chest pain. Patient not taking: Reported on 10/26/2017 03/16/16   Larey Dresser, MD  omega-3 acid ethyl esters (LOVAZA) 1 g capsule Take 2 capsules (2 g  total) by mouth 2 (two) times daily. 04/29/17   Satira Sark, MD  Potassium 99 MG TABS Take 1 tablet by mouth daily.    [provider]  spironolactone (ALDACTONE) 25 MG tablet Take 0.5 tablets (12.5 mg total) by mouth daily. 09/23/17   Satira Sark, MD    Family History Family History  Problem Relation Age of Onset  . Diabetes Brother   . Cancer Brother   . Diabetes Mother   . Coronary artery disease Father   . Heart attack Father 77  . Diabetes Sister   . Colon cancer Neg Hx   . Prostate cancer Neg Hx   . Stroke Neg Hx     Social History Social History   Tobacco Use  . Smoking status: Former Smoker    Packs/day: 1.00    Years: 25.00    Pack  years: 25.00    Types: Cigarettes    Last attempt to quit: 04/12/1992    Years since quitting: 25.6  . Smokeless tobacco: Never Used  Substance Use Topics  . Alcohol use: Yes    Comment: 12/22/2012 "rarely maybe every 3-4 months, beer"  . Drug use: No     Allergies   Trulicity [dulaglutide]   Review of Systems Review of Systems ROS: Statement: All systems negative except as marked or noted in the HPI; Constitutional: Negative for fever and chills. ; ; Eyes: Negative for eye pain, redness and discharge. ; ; ENMT: Negative for ear pain, hoarseness, nasal congestion, sinus pressure and sore throat. ; ; Cardiovascular: Negative for chest pain, palpitations, diaphoresis, dyspnea and peripheral edema. ; ; Respiratory: Negative for cough, wheezing and stridor. ; ; Gastrointestinal: Negative for nausea, vomiting, diarrhea, abdominal pain, blood in stool, hematemesis, jaundice and rectal bleeding. . ; ; Genitourinary: Negative for dysuria, flank pain and hematuria. ; ; Musculoskeletal: Negative for back pain and neck pain. Negative for swelling and trauma.; ; Skin: Negative for pruritus, rash, abrasions, blisters, bruising and +bleeding skin lesion.; ; Neuro: Negative for headache, lightheadedness and neck stiffness. Negative  for weakness, altered level of consciousness, altered mental status, extremity weakness, paresthesias, involuntary movement, seizure and syncope.     Physical Exam Updated Vital Signs BP (!) 160/43 (BP Location: Right Arm)   Pulse (!) 40   Temp 98.4 F (36.9 C) (Oral)   Resp 18   Ht 6\' 2"  (1.88 m)   Wt 127 kg (280 lb)   SpO2 97%   BMI 35.95 kg/m     Physical Exam 1650: Physical examination:  Nursing notes reviewed; Vital signs and O2 SAT reviewed;  Constitutional: Well developed, Well nourished, Well hydrated, In no acute distress; Head:  Normocephalic, atraumatic; Eyes: EOMI, PERRL, No scleral icterus; ENMT: Mouth and pharynx normal, Mucous membranes moist; Neck: Supple, Full range of motion, No lymphadenopathy; Cardiovascular: Irregular rate and rhythm, No gallop; Respiratory: Breath sounds clear & equal bilaterally, No wheezes.  Speaking full sentences with ease, Normal respiratory effort/excursion; Chest: Nontender, No deformity. +bloody dressing left upper chest wall. Movement normal; Abdomen: Soft, Nontender, Nondistended, Normal bowel sounds; Genitourinary: No CVA tenderness; Extremities: Peripheral pulses normal, No tenderness, No edema, No calf edema or asymmetry.; Neuro: AA&Ox3, Major CN grossly intact.  Speech clear. No gross focal motor or sensory deficits in extremities.; Skin: Color normal, Warm, Dry.   ED Treatments / Results  Labs (all labs ordered are listed, but only abnormal results are displayed)   EKG EKG Interpretation  Date/Time:  Sunday November 14 2017 16:52:40 EDT Ventricular Rate:  105 PR Interval:    QRS Duration: 136 QT Interval:  412 QTC Calculation: 415 R Axis:   -23 Text Interpretation:  Sinus tachycardia Paired ventricular premature complexes LVH with IVCD and secondary repol abnrm Anterior ST elevation, probably due to LVH When compared with ECG of 12/23/2012 paired and frequent pvc are now present Confirmed by Francine Graven [id number]) on 11/14/2017  5:02:50 PM   Radiology   Procedures Procedures (including critical care time)  Medications Ordered in ED Medications - No data to display   Initial Impression / Assessment and Plan / ED Course  I have reviewed the triage vital signs and the nursing notes.  Pertinent labs & imaging results that were available during my care of the patient were reviewed by me and considered in my medical decision making (see chart for details).  MDM Reviewed: previous  chart, nursing note and vitals Reviewed previous: labs and ECG Interpretation: labs, ECG and x-ray Total time providing critical care: 30-74 minutes. This excludes time spent performing separately reportable procedures and services. Consults: cardiology and admitting MD   CRITICAL CARE Performed by: Alfonzo Feller Total critical care time: 35 minutes Critical care time was exclusive of separately billable procedures and treating other patients. Critical care was necessary to treat or prevent imminent or life-threatening deterioration. Critical care was time spent personally by me on the following activities: development of treatment plan with patient and/or surrogate as well as nursing, discussions with consultants, evaluation of patient's response to treatment, examination of patient, obtaining history from patient or surrogate, ordering and performing treatments and interventions, ordering and review of laboratory studies, ordering and review of radiographic studies, pulse oximetry and re-evaluation of patient's condition.   Results for orders placed or performed during the hospital encounter of 78/29/56  Basic metabolic panel  Result Value Ref Range   Sodium 139 135 - 145 mmol/L   Potassium 3.8 3.5 - 5.1 mmol/L   Chloride 105 101 - 111 mmol/L   CO2 24 22 - 32 mmol/L   Glucose, Bld 251 (H) 65 - 99 mg/dL   BUN 16 6 - 20 mg/dL   Creatinine, Ser 0.90 0.61 - 1.24 mg/dL   Calcium 9.3 8.9 - 10.3 mg/dL   GFR calc non Af Amer  >60 >60 mL/min   GFR calc Af Amer >60 >60 mL/min   Anion gap 10 5 - 15  Troponin I  Result Value Ref Range   Troponin I <0.03 <0.03 ng/mL  CBC with Differential  Result Value Ref Range   WBC 6.5 4.0 - 10.5 K/uL   RBC 4.25 4.22 - 5.81 MIL/uL   Hemoglobin 11.8 (L) 13.0 - 17.0 g/dL   HCT 36.6 (L) 39.0 - 52.0 %   MCV 86.1 78.0 - 100.0 fL   MCH 27.8 26.0 - 34.0 pg   MCHC 32.2 30.0 - 36.0 g/dL   RDW 14.9 11.5 - 15.5 %   Platelets 239 150 - 400 K/uL   Neutrophils Relative % 58 %   Neutro Abs 3.7 1.7 - 7.7 K/uL   Lymphocytes Relative 27 %   Lymphs Abs 1.8 0.7 - 4.0 K/uL   Monocytes Relative 10 %   Monocytes Absolute 0.7 0.1 - 1.0 K/uL   Eosinophils Relative 4 %   Eosinophils Absolute 0.3 0.0 - 0.7 K/uL   Basophils Relative 1 %   Basophils Absolute 0.1 0.0 - 0.1 K/uL  Magnesium  Result Value Ref Range   Magnesium 1.4 (L) 1.7 - 2.4 mg/dL  Protime-INR  Result Value Ref Range   Prothrombin Time 12.8 11.4 - 15.2 seconds   INR 0.97    Dg Chest 2 View Result Date: 11/14/2017 CLINICAL DATA:  Irregular heart rate. EXAM: CHEST - 2 VIEW COMPARISON:  12/22/2012 FINDINGS: The lungs are clear without focal pneumonia, edema, pneumothorax or pleural effusion. Cardiopericardial silhouette is at upper limits of normal for size. There is pulmonary vascular congestion without overt pulmonary edema. Radiopaque foreign body identified in the left supraclavicular region. Telemetry leads overlie the chest. The visualized bony structures of the thorax are intact. IMPRESSION: 1. Upper normal heart size with vascular congestion. 2. Radiopaque foreign body projecting over the left supraclavicular soft tissues. Electronically Signed   By: Misty Stanley M.D.   On: 11/14/2017 17:54    Results for KALEI, MCKILLOP (MRN [id number]) as of 11/14/2017 19:01  Ref. Range 12/05/2014 11:02 01/02/2016 11:06 08/03/2016 16:00 11/14/2017 17:22  Hemoglobin Latest Ref Range: 13.0 - 17.0 g/dL 14.0 13.3 14.2 11.8 (L)  HCT Latest Ref  Range: 39.0 - 52.0 % 41.3 39.9 42.4 36.6 (L)    1650:  Triage RN states pt's HR was "37." Pt placed on monitor: HR 80's, irregular. EKG as above. Pt denies CP/SOB/palpitations, denies generalized weakness/near syncope.   1745:  Quick clot DSD and pressure held to bleeding mole left upper chest wall with good hemostasis.   1830:  IV magnesium and PO potassium ordered. Pt has been actively bleeding for the past 7+ hours and H/H lower than previous 2 years ago. EKG changes (frequent PVC's) are new today.  T/C returned from [facility] Cards Dr. Georgette Shell, case discussed, including:  HPI, pertinent PM/SHx, VS/PE, dx testing, ED course and treatment: she has viewed the EKG from today, agrees with electrolyte replacement, nothing emergent needed at [place] for tonight; pt is OK to stay at [facility], keep on tele to monitor for chance/risk of VT, re-check electrolytes, H/H and troponin, consult Cards MD tomorrow prn. Dx and testing, as well as Cards MD, d/w pt and family.  Questions answered.  Verb understanding, agreeable to admit.  1910:  T/C returned from [facility] Dr. Olevia Bowens, case discussed, including:  HPI, pertinent PM/SHx, VS/PE, dx testing, ED course and treatment:  Agreeable to admit, requests to order another dose of IV magnesium 2gm (4gm total).      Final Clinical Impressions(s) / ED Diagnoses   Final diagnoses:  None    ED Discharge Orders    None       Francine Graven, DO 11/16/17 1558

## 2017-11-14 NOTE — ED Notes (Signed)
Placed quick clot dressing covered with tape on to bleeding area to mole on lt upper chest

## 2017-11-14 NOTE — ED Notes (Signed)
Dressing on top of bleeding area is clean and dry at this time

## 2017-11-15 DIAGNOSIS — I499 Cardiac arrhythmia, unspecified: Secondary | ICD-10-CM | POA: Diagnosis not present

## 2017-11-15 LAB — BASIC METABOLIC PANEL
Anion gap: 10 (ref 5–15)
BUN: 16 mg/dL (ref 6–20)
CHLORIDE: 104 mmol/L (ref 101–111)
CO2: 23 mmol/L (ref 22–32)
CREATININE: 0.72 mg/dL (ref 0.61–1.24)
Calcium: 8.8 mg/dL — ABNORMAL LOW (ref 8.9–10.3)
GFR calc Af Amer: >60 mL/min (ref >60)
GFR calc non Af Amer: >60 mL/min (ref >60)
GLUCOSE: 201 mg/dL — AB (ref 65–99)
Potassium: 4 mmol/L (ref 3.5–5.1)
Sodium: 137 mmol/L (ref 135–145)

## 2017-11-15 LAB — CBC WITH DIFFERENTIAL/PLATELET
Basophils Absolute: 0 10*3/uL (ref 0.0–0.1)
Basophils Relative: 1 %
Eosinophils Absolute: 0.3 10*3/uL (ref 0.0–0.7)
Eosinophils Relative: 5 %
HEMATOCRIT: 36.5 % — AB (ref 39.0–52.0)
Hemoglobin: 11.7 g/dL — ABNORMAL LOW (ref 13.0–17.0)
LYMPHS PCT: 32 %
Lymphs Abs: 2 10*3/uL (ref 0.7–4.0)
MCH: 28 pg (ref 26.0–34.0)
MCHC: 32.1 g/dL (ref 30.0–36.0)
MCV: 87.3 fL (ref 78.0–100.0)
MONO ABS: 0.5 10*3/uL (ref 0.1–1.0)
MONOS PCT: 8 %
NEUTROS ABS: 3.4 10*3/uL (ref 1.7–7.7)
Neutrophils Relative %: 54 %
PLATELETS: 238 10*3/uL (ref 150–400)
RBC: 4.18 MIL/uL — ABNORMAL LOW (ref 4.22–5.81)
RDW: 15 % (ref 11.5–15.5)
WBC: 6.2 10*3/uL (ref 4.0–10.5)

## 2017-11-15 LAB — TROPONIN I
Troponin I: <0.03 ng/mL
Troponin I: <0.03 ng/mL (ref <0.03)

## 2017-11-15 LAB — HEMOGLOBIN AND HEMATOCRIT, BLOOD
HCT: 36.7 % — ABNORMAL LOW (ref 39.0–52.0)
Hemoglobin: 11.7 g/dL — ABNORMAL LOW (ref 13.0–17.0)

## 2017-11-15 LAB — MAGNESIUM: Magnesium: 1.8 mg/dL (ref 1.7–2.4)

## 2017-11-15 LAB — GLUCOSE, CAPILLARY
Glucose-Capillary: 196 mg/dL — ABNORMAL HIGH (ref 65–99)
Glucose-Capillary: 206 mg/dL — ABNORMAL HIGH (ref 65–99)

## 2017-11-15 MED ORDER — MAGNESIUM OXIDE 400 (241.3 MG) MG PO TABS: 400.0000 mg | ORAL_TABLET | Freq: Every day | ORAL | 3 refills | Status: DC

## 2017-11-15 NOTE — Progress Notes (Signed)
Removed IV-clean, dry, intact. Reviewed d/c paperwork with pt and wife. Reviewed new medications and where to pick up. The NT wheeled stable pt and belongings to main entrance where he got in the car.

## 2017-11-15 NOTE — Discharge Summary (Signed)
Physician Discharge Summary  [name] GNF:[id number] DOB: [birth year] DOA: 11/14/2017  PCP: Colon Branch, MD  Admit date: 11/14/2017 Discharge date: 11/15/2017  Time spent: 45 minutes  Recommendations for Outpatient Follow-up:  -Will be discharged home today. -Cardiology has arranged for patient to get 24 hr Holter monitor.   Discharge Diagnoses:  Principal Problem:   Cardiac arrhythmia, unspecified Active Problems:   Poorly controlled type 2 diabetes mellitus with circulatory disorder (HCC)   Hyperlipidemia   HTN (hypertension)   Coronary Artery Disease   Anxiety and depression   OSA on CPAP   Hypomagnesemia   Anemia   Bleeding skin mole   Discharge Condition: Stable and improved  Filed Weights   11/14/17 1634 11/14/17 2100  Weight: 127 kg (280 lb) 133.4 kg (294 lb)    History of present illness:  As per Dr. Olevia Bowens on 3/24: Jyren Cerasoli. is a 67 y.o. male with medical history significant of osteoarthritis, CAD, NSTEMI, history of stent placement to LAD in 2012, history of PTCA on LAD on May 2014, ischemic cardiomyopathy, hypertension, hyperlipidemia, type 2 diabetes, morbid obesity, OSA on CPAP who is coming to the emergency department with complaints of having a skin tab persistently bleeding since the morning.  He takes aspirin and clopidogrel for CAD at home.  He mentioned that he has been unable to stop the bleeding despite applying pressure for some time.  He denies any trauma or manipulation to the area.  No fever, chills, sore throat, cough, hemoptysis, chest pain, palpitations, dizziness, diaphoresis, PND or orthopnea.  He occasionally gets mild lower extremity edema.  No abdominal pain, nausea, vomiting, diarrhea, constipation, melena or hematochezia.  Denies dysuria, frequency or hematuria.  No polyphagia, polyuria, polydipsia or blurred vision.  No heat or cold intolerance.  ED Course: Initial vital signs temperature 36.9C (98.4 F) pulse 66,  respirations 18, blood pressure 160/43 mmHg and O2 sat 97%.  He was given 4 g of magnesium sulfate in the ED.  Workup shows an EKG showing sinus tach at 105 bpm with prior PVCs, LVH with IVCD and secondary repolarization abnormality.  There is anterior ST elevation.  Positive anterior ST elevation, which could be related to LVH.  When compared to previous, diffuse T wave abnormality is is seen now more anteriorly.  Troponin level #1 was normal.  His white count was 6.5 with a normal differential., hemoglobin 11.8 g/dL (14.2 g/dL on 08/03/2016) and platelets 239.  PT 12.8 seconds and INR 0.97. Sodium 139, potassium 3.8, chloride 105 and CO2 24 mmol/L.  BUN was 16, creatinine 0.90, glucose 251, magnesium 1.4 and phosphorus 2.8 mg/dL.  His chest radiographs show borderline cardiomegaly with vascular congestion.  There was a radiopaque foreign body on his left supraclavicular area.  Please see images and full radiology report for further detail.    Hospital Course:   Bleeding Skin Tag -Stopped bleeding with pressure bandage. -Advised to follow up with his dermatologist for removal.  Frequent PVCs -Telemetry reviewed by cardiology: NST with occasional PACs with aberrancy. -Dr. Harl Bowie is recommending 24 hr Holter monitor which will be arranged thru his office. -Mg and K have been replaced.  Rest of chronic conditions have been stable during this short hospitalization.  Procedures:  None   Consultations:  Cardiology  Discharge Instructions  Discharge Instructions    Diet - low sodium heart healthy   Complete by:  As directed    Increase activity slowly   Complete by:  As directed      Allergies as of 11/15/2017      Reactions   Trulicity [dulaglutide] Nausea And Vomiting      Medication List    STOP taking these medications   Magnesium 250 MG Tabs   metoCLOPramide 5 MG tablet Commonly known as:  REGLAN     TAKE these medications   acetaminophen 500 MG tablet Commonly  known as:  TYLENOL Take 500 mg by mouth every 6 (six) hours as needed.   aspirin EC 81 MG tablet Take 81 mg by mouth daily.   atorvastatin 80 MG tablet Commonly known as:  LIPITOR Take 1 tablet (80 mg total) by mouth daily.   azelastine 0.1 % nasal spray Commonly known as:  ASTELIN Place 1-2 sprays into both nostrils 2 (two) times daily. Use in each nostril as directed   BD PEN NEEDLE NANO U/F 32G X 4 MM Misc Generic drug:  Insulin Pen Needle USE FOR INJECTIONS FOUR TIMES DAILY   carvedilol 12.5 MG tablet Commonly known as:  COREG TAKE 1 TABLET BY MOUTH TWICE DAILY   citalopram 20 MG tablet Commonly known as:  CELEXA Take 1 tablet (20 mg total) daily by mouth.   clopidogrel 75 MG tablet Commonly known as:  PLAVIX TAKE 1 TABLET BY MOUTH EVERY DAY   clotrimazole-betamethasone cream Commonly known as:  LOTRISONE Apply topically 2 (two) times daily. What changed:    when to take this  reasons to take this   famotidine 20 MG tablet Commonly known as:  PEPCID Take 20 mg by mouth 2 (two) times daily as needed for heartburn.   fenofibrate 145 MG tablet Commonly known as:  TRICOR TAKE 1 TABLET BY MOUTH EVERY DAY   glucose blood test strip Use 3x a day - for OneTouch Ultra mini   insulin aspart protamine- aspart (70-30) 100 UNIT/ML injection Commonly known as:  NOVOLOG MIX 70/30 Inject 60 Units into the skin daily with breakfast.   insulin aspart protamine- aspart (70-30) 100 UNIT/ML injection Commonly known as:  NOVOLOG MIX 70/30 Inject 45 Units into the skin daily with supper.   insulin regular 100 units/mL injection Commonly known as:  NOVOLIN R RELION TID after meals, pt not sure # of units What changed:    how much to take  how to take this  when to take this  additional instructions   losartan 100 MG tablet Commonly known as:  COZAAR Take 1 tablet (100 mg total) by mouth daily.   magnesium oxide 400 (241.3 Mg) MG tablet Commonly known as:   MAG-OX Take 1 tablet (400 mg total) by mouth daily.   metFORMIN 500 MG 24 hr tablet Commonly known as:  GLUCOPHAGE-XR Take 1 tablet (500 mg total) by mouth 3 (three) times daily with meals.   multivitamin with minerals Tabs tablet Take 1 tablet by mouth daily.   nitroGLYCERIN 0.4 MG SL tablet Commonly known as:  NITROSTAT Place 1 tablet (0.4 mg total) under the tongue every 5 (five) minutes as needed for chest pain.   omega-3 acid ethyl esters 1 g capsule Commonly known as:  LOVAZA Take 2 capsules (2 g total) by mouth 2 (two) times daily.   omeprazole 20 MG capsule Commonly known as:  PRILOSEC Take 1 tablet by mouth twice a day.   ondansetron 4 MG disintegrating tablet Commonly known as:  ZOFRAN-ODT Take 1 tablet by mouth every 8 (eight) hours as needed for Nausea. For post- op use   Potassium  99 MG Tabs Take 1 tablet by mouth daily.   spironolactone 25 MG tablet Commonly known as:  ALDACTONE Take 0.5 tablets (12.5 mg total) by mouth daily.   ursodiol 300 MG capsule Commonly known as:  ACTIGALL Take by mouth.      Allergies  Allergen Reactions  . Trulicity [Dulaglutide] Nausea And Vomiting   Follow-up Information    Colon Branch, MD. Schedule an appointment as soon as possible for a visit on 11/24/2017.   Specialty:  Internal Medicine Why:  11:30am Please arrive by 11:05.  Bring your insurance card, photo ID, list of medications.  Contact information: [address] [facility] [address] [place] [address] [phone]        Cardiology office in [place].   Why:  Will call you to have Holter Monitor placed           The results of significant diagnostics from this hospitalization (including imaging, microbiology, ancillary and laboratory) are listed below for reference.    Significant Diagnostic Studies: Dg Chest 2 View  Result Date: 11/14/2017 CLINICAL DATA:  Irregular heart rate. EXAM: CHEST - 2 VIEW COMPARISON:  12/22/2012 FINDINGS: The  lungs are clear without focal pneumonia, edema, pneumothorax or pleural effusion. Cardiopericardial silhouette is at upper limits of normal for size. There is pulmonary vascular congestion without overt pulmonary edema. Radiopaque foreign body identified in the left supraclavicular region. Telemetry leads overlie the chest. The visualized bony structures of the thorax are intact. IMPRESSION: 1. Upper normal heart size with vascular congestion. 2. Radiopaque foreign body projecting over the left supraclavicular soft tissues. Electronically Signed   By: Misty Stanley M.D.   On: 11/14/2017 17:54    Microbiology: No results found for this or any previous visit (from the past 240 hour(s)).   Labs: Basic Metabolic Panel: Recent Labs  Lab 11/14/17 1722 11/15/17 0552  NA 139 137  K 3.8 4.0  CL 105 104  CO2 24 23  GLUCOSE 251* 201*  BUN 16 16  CREATININE 0.90 0.72  CALCIUM 9.3 8.8*  MG 1.4* 1.8  PHOS 2.8  --    Liver Function Tests: No results for input(s): AST, ALT, ALKPHOS, BILITOT, PROT, ALBUMIN in the last 168 hours. No results for input(s): LIPASE, AMYLASE in the last 168 hours. No results for input(s): AMMONIA in the last 168 hours. CBC: Recent Labs  Lab 11/14/17 1722 11/14/17 2356 11/15/17 0552  WBC 6.5  --  6.2  NEUTROABS 3.7  --  3.4  HGB 11.8* 11.7* 11.7*  HCT 36.6* 36.7* 36.5*  MCV 86.1  --  87.3  PLT 239  --  238   Cardiac Enzymes: Recent Labs  Lab 11/14/17 1722 11/14/17 2356 11/15/17 0552  TROPONINI <0.03 <0.03 <0.03   BNP: BNP (last 3 results) No results for input(s): BNP in the last 8760 hours.  ProBNP (last 3 results) No results for input(s): PROBNP in the last 8760 hours.  CBG: Recent Labs  Lab 11/14/17 2103 11/15/17 0749 11/15/17 1113  GLUCAP 160* 196* 206*       Signed:  Lelon Frohlich  [facility] Pager: [phone] 11/15/2017, 2:24 PM

## 2017-11-15 NOTE — Plan of Care (Signed)
Progressing

## 2017-11-15 NOTE — Progress Notes (Signed)
Patient admitted with bleeding skin tag, noted to have frequent PVCs and cardiology was consulted. K 3.8, mg 1.4, Mg has been replaced. Last echo 02/2017 with normal LVEF. Tele reviewed, shows SR primarily with occasional PACs with aberrancy, occasional PVCs. No sustained arrhythmias. No further workup planned at this time as inpatient, we will obtain 24 hr holter as outpatient to clarify PVC burden.   Carlyle Dolly MD

## 2017-11-16 ENCOUNTER — Telehealth: Payer: Self-pay | Admitting: *Deleted

## 2017-11-16 NOTE — Telephone Encounter (Signed)
Transition Care Management Follow-up Telephone Call   Date discharged?11/15/17   How have you been since you were released from the hospital? "doing great" per pt   Do you understand why you were in the hospital? yes   Do you understand the discharge instructions? yes   Where were you discharged to? Home with wife   Items Reviewed:  Medications reviewed: no. Med list not on hand  Allergies reviewed: yes  Dietary changes reviewed: yes  Referrals reviewed: yes   Functional Questionnaire:   Activities of Daily Living (ADLs):   He states they are independent in the following: ambulation, bathing and hygiene, feeding, continence, grooming, toileting and dressing States they require assistance with the following: na   Any transportation issues/concerns?: no   Any patient concerns? no   Confirmed importance and date/time of follow-up visits scheduled yes  Provider Appointment booked with DR.Capulin 11/24/17  Confirmed with patient if condition begins to worsen call PCP or go to the ER.  Patient was given the office number and encouraged to call back with question or concerns.  : yes

## 2017-11-16 NOTE — Telephone Encounter (Signed)
thx

## 2017-11-17 ENCOUNTER — Encounter: Payer: Self-pay | Admitting: Cardiology

## 2017-11-17 ENCOUNTER — Telehealth: Payer: Self-pay

## 2017-11-17 DIAGNOSIS — I493 Ventricular premature depolarization: Secondary | ICD-10-CM

## 2017-11-17 NOTE — Telephone Encounter (Signed)
Called pt. Left message for him to return call. Order for 24 hr  holter monitor placed.

## 2017-11-17 NOTE — Telephone Encounter (Signed)
-----   Message from Arnoldo Lenis, MD sent at 11/15/2017  8:52 AM EDT ----- This patient needs a 24 hr holter for PVCs, and 3 week f/u with Dr Domenic Polite or PA please   Zandra Abts MD

## 2017-11-18 ENCOUNTER — Ambulatory Visit (HOSPITAL_COMMUNITY)
Admission: RE | Admit: 2017-11-18 | Discharge: 2017-11-18 | Disposition: A | Discharge status: Home / Self Care | Payer: PPO | Source: Ambulatory Visit | Attending: Cardiology | Admitting: Cardiology

## 2017-11-18 DIAGNOSIS — I493 Ventricular premature depolarization: Secondary | ICD-10-CM

## 2017-11-18 NOTE — Telephone Encounter (Signed)
I reviewed the recent email questions from Randy Castro and also the chart including evaluation by Dr. Harl Bowie as an inpatient just recently.  There are a couple of things to consider here.  Dr. Harl Bowie recommended a 24-hour Holter monitor to further investigate ectopy, would please make sure that this is ordered.  Randy Castro also needs an office visit to review not only this Holter monitor, but also address the preoperative concerns prior to gastric bypass surgery later in April.  He may in fact need to have a stress test since his last coronary intervention was about 5 years ago.  Please make sure that he has a visit well enough in advance of his planned gastric bypass surgery that all of these issues can be addressed.

## 2017-11-24 ENCOUNTER — Ambulatory Visit (INDEPENDENT_AMBULATORY_CARE_PROVIDER_SITE_OTHER): Payer: PPO | Admitting: Internal Medicine

## 2017-11-24 ENCOUNTER — Encounter: Payer: Self-pay | Admitting: Internal Medicine

## 2017-11-24 DIAGNOSIS — I10 Essential (primary) hypertension: Secondary | ICD-10-CM

## 2017-11-24 DIAGNOSIS — I493 Ventricular premature depolarization: Secondary | ICD-10-CM | POA: Diagnosis not present

## 2017-11-24 LAB — BASIC METABOLIC PANEL
BUN: 13 mg/dL (ref 6–23)
CALCIUM: 9 mg/dL (ref 8.4–10.5)
CO2: 28 mEq/L (ref 19–32)
CREATININE: 0.79 mg/dL (ref 0.40–1.50)
Chloride: 103 mEq/L (ref 96–112)
GFR: 104.22 mL/min (ref >60.00)
Glucose, Bld: 265 mg/dL — ABNORMAL HIGH (ref 70–99)
Potassium: 3.8 mEq/L (ref 3.5–5.1)
Sodium: 138 mEq/L (ref 135–145)

## 2017-11-24 LAB — MAGNESIUM: MAGNESIUM: 1.4 mg/dL — AB (ref 1.5–2.5)

## 2017-11-24 NOTE — Patient Instructions (Addendum)
  GO TO THE LAB : Get the blood work     

## 2017-11-24 NOTE — Progress Notes (Signed)
Subjective:    Patient ID: Randy Foots., male    DOB: Feb 08, 1951, 67 y.o.   MRN: [id number]  DOS:  11/24/2017 Type of visit - description :  TCM 14 Interval history: Went to the ER with a skin bleeding,  The evaluation included labs and a  EKG. EKG was somewhat different to previous one, troponin was negative. A chest x-ray showing some vascular congestion. Eventually was admitted to hospital due to frequent PVCs, low magnesium and potassium. Here for follow-up  Review of Systems Since he left the hospital he is doing well.  Area is not further bleeding. He was seen with frequent PVCs but he was and remains asymptomatic. Specifically denies chest pain, palpitations.  Occasionally gets short of breath which is not unusual No nausea, vomiting, diarrhea no lower extremity edema. Good medication compliance.   Past Medical History:  Diagnosis Date  . Arthritis   . CAD (coronary artery disease)    a. NSTEMI 12/12: EF 40-45%. JSE:GBTDVVO balloon PTCA + Promus DES x 1 to mid LAD;  b. 11/2012: Cath with [place] balloon PTCA  LAD for ISR to LAD, EF 55%;  c. 12/2012 Cath/PCI: LAD stents patent, 80 ost Diag (jailed)->PTCA, LCX/RCA patent.  . Essential hypertension   . Hyperlipidemia   . Ischemic cardiomyopathy    a.  echo 08/06/11: dist ant wall, apical and septal and infero-apical HK, mild LVH, EF 40%, mild LAE, PASP 34, asc aorta mildly dilated, mild TR;  b.  Echo (3/13) showed recovery of LV systolic function with EF 16-07%, grade I diastolic dysfunction, mild MR.   . Type 2 diabetes mellitus ([place])     Past Surgical History:  Procedure Laterality Date  . ANTERIOR CERVICAL DECOMP/DISCECTOMY FUSION  in the 90s  . CORONARY ANGIOPLASTY  12/15/2012; 12/22/2012  . CORONARY ANGIOPLASTY WITH STENT PLACEMENT  07/2011   "1" (12/22/2012)  . KNEE ARTHROSCOPY Left 12/31/2016  . LEFT HEART CATHETERIZATION WITH CORONARY ANGIOGRAM N/A 08/06/2011   Procedure: LEFT HEART CATHETERIZATION WITH CORONARY  ANGIOGRAM;  Surgeon: Burnell Blanks, MD;  Location: [facility] CATH LAB;  Service: Cardiovascular;  Laterality: N/A;  . LEFT HEART CATHETERIZATION WITH CORONARY ANGIOGRAM N/A 12/15/2012   Procedure: LEFT HEART CATHETERIZATION WITH CORONARY ANGIOGRAM;  Surgeon: Sherren Mocha, MD;  Location: [facility] CATH LAB;  Service: Cardiovascular;  Laterality: N/A;  . LEFT HEART CATHETERIZATION WITH CORONARY ANGIOGRAM N/A 12/22/2012   Procedure: LEFT HEART CATHETERIZATION WITH CORONARY ANGIOGRAM;  Surgeon: Sherren Mocha, MD;  Location: [facility] CATH LAB;  Service: Cardiovascular;  Laterality: N/A;  . PERCUTANEOUS CORONARY STENT INTERVENTION (PCI-S) Right 12/15/2012   Procedure: PERCUTANEOUS CORONARY STENT INTERVENTION (PCI-S);  Surgeon: Sherren Mocha, MD;  Location: [facility] CATH LAB;  Service: Cardiovascular;  Laterality: Right;    Social History   Socioeconomic History  . Marital status: Married    Spouse name: Not on file  . Number of children: 2   . Years of education: Not on file  . Highest education level: Not on file  Occupational History  . Occupation: Audiological scientist  Social Needs  . Financial resource strain: Not on file  . Food insecurity:    Worry: Not on file    Inability: Not on file  . Transportation needs:    Medical: Not on file    Non-medical: Not on file  Tobacco Use  . Smoking status: Former Smoker    Packs/day: 1.00    Years: 25.00    Pack years: 25.00    Types: Cigarettes  Last attempt to quit: 04/12/1992    Years since quitting: 25.6  . Smokeless tobacco: Never Used  Substance and Sexual Activity  . Alcohol use: Yes    Comment: 12/22/2012 "rarely maybe every 3-4 months, beer"  . Drug use: No  . Sexual activity: Yes  Lifestyle  . Physical activity:    Days per week: Not on file    Minutes per session: Not on file  . Stress: Not on file  Relationships  . Social connections:    Talks on phone: Not on file    Gets together: Not on file    Attends religious service: Not on  file    Active member of club or organization: Not on file    Attends meetings of clubs or organizations: Not on file    Relationship status: Not on file  . Intimate partner violence:    Fear of current or ex partner: Not on file    Emotionally abused: Not on file    Physically abused: Not on file    Forced sexual activity: Not on file  Other Topics Concern  . Not on file  Social History Narrative   Moved back to [place] from [place]-- pt and wife      Allergies as of 11/24/2017      Reactions   Trulicity [dulaglutide] Nausea And Vomiting      Medication List        Accurate as of 11/24/17 11:59 PM. Always use your most recent med list.          acetaminophen 500 MG tablet Commonly known as:  TYLENOL Take 500 mg by mouth every 6 (six) hours as needed.   aspirin EC 81 MG tablet Take 81 mg by mouth daily.   atorvastatin 80 MG tablet Commonly known as:  LIPITOR Take 1 tablet (80 mg total) by mouth daily.   azelastine 0.1 % nasal spray Commonly known as:  ASTELIN Place 1-2 sprays into both nostrils 2 (two) times daily. Use in each nostril as directed   BD PEN NEEDLE NANO U/F 32G X 4 MM Misc Generic drug:  Insulin Pen Needle USE FOR INJECTIONS FOUR TIMES DAILY   carvedilol 12.5 MG tablet Commonly known as:  COREG TAKE 1 TABLET BY MOUTH TWICE DAILY   citalopram 20 MG tablet Commonly known as:  CELEXA Take 1 tablet (20 mg total) daily by mouth.   clopidogrel 75 MG tablet Commonly known as:  PLAVIX TAKE 1 TABLET BY MOUTH EVERY DAY   clotrimazole-betamethasone cream Commonly known as:  LOTRISONE Apply topically 2 (two) times daily.   famotidine 20 MG tablet Commonly known as:  PEPCID Take 20 mg by mouth 2 (two) times daily as needed for heartburn.   fenofibrate 145 MG tablet Commonly known as:  TRICOR TAKE 1 TABLET BY MOUTH EVERY DAY   glucose blood test strip Use 3x a day - for OneTouch Ultra mini   insulin regular 100 units/mL  injection Commonly known as:  NOVOLIN R RELION TID after meals, pt not sure # of units   losartan 100 MG tablet Commonly known as:  COZAAR Take 1 tablet (100 mg total) by mouth daily.   magnesium oxide 400 (241.3 Mg) MG tablet Commonly known as:  MAG-OX Take 1 tablet (400 mg total) by mouth 2 (two) times daily.   metFORMIN 500 MG 24 hr tablet Commonly known as:  GLUCOPHAGE-XR Take 1 tablet (500 mg total) by mouth 3 (three) times daily  with meals.   multivitamin with minerals Tabs tablet Take 1 tablet by mouth daily.   nitroGLYCERIN 0.4 MG SL tablet Commonly known as:  NITROSTAT Place 1 tablet (0.4 mg total) under the tongue every 5 (five) minutes as needed for chest pain.   omega-3 acid ethyl esters 1 g capsule Commonly known as:  LOVAZA Take 2 capsules (2 g total) by mouth 2 (two) times daily.   omeprazole 20 MG capsule Commonly known as:  PRILOSEC Take 1 tablet by mouth twice a day.   ondansetron 4 MG disintegrating tablet Commonly known as:  ZOFRAN-ODT Take 1 tablet by mouth every 8 (eight) hours as needed for Nausea. For post- op use   Potassium 99 MG Tabs Take 1 tablet by mouth daily.   spironolactone 25 MG tablet Commonly known as:  ALDACTONE Take 0.5 tablets (12.5 mg total) by mouth daily.   ursodiol 300 MG capsule Commonly known as:  ACTIGALL Take by mouth.          Objective:   Physical Exam  Skin:      BP 128/78 (BP Location: Left Arm, Patient Position: Sitting, Cuff Size: Normal)   Pulse 68   Temp (!) 97.3 F (36.3 C) (Oral)   Resp 14   Ht 6\' 2"  (1.88 m)   Wt (!) 307 lb 4 oz (139.4 kg)   SpO2 97%   BMI 39.45 kg/m  General:   Well developed, morbidly obese appearing. NAD.  HEENT:  Normocephalic . Face symmetric, atraumatic Lungs:  CTA B Normal respiratory effort, no intercostal retractions, no accessory muscle use. Heart: RRR,  no murmur.  No pretibial edema bilaterally  Skin: Not pale. Not jaundice Neurologic:  alert & oriented  X3.  Speech normal, gait appropriate for age and unassisted Psych--  Cognition and judgment appear intact.  Cooperative with normal attention span and concentration.  Behavior appropriate. No anxious or depressed appearing.      Assessment & Plan:   Assessment   DM -- insulin dependent, uncontrolled, with complications, CAD CHF.  Endo: [place] HTN Hyperlipidemia CAD, CHF, ischemic cardiomyopathy NSTEMI in 12/12, unstable angina in 4/14 and 5/14. He had DES to mid LAD in 12/12. EF was 40% with apical hypokinesis at the time of the MI. Repeat echo in 3/13 showed EF 55-60%. In 4/14, he was admitted with unstable angina and had 90% pLAD in-stent restenosis. This was treated with cutting balloon angioplasty. Readmitted, again with unstable angina, in 5/14. This time he had PTCA to 90% ostial D1 stenosis.  OSA dx ~08-2017, om Cpap Anxiety-depression Skin cancer: Right leg, status post excision 2016; Dr Allyson Sabal  PLAN: HTN, CAD, CHF. Was recently admitted with hypokalemia, hypomagnesemia.    We will recheck BMP and magnesium levels, otherwise continue with carvedilol, losartan, magnesium, Aldactone, potassium. Frequent PVCs: Per cardiology, they did a Holter monitor, results pending.  He is asx Bleeding SK: Currently with no problems, we talk about possibly referral to dermatology but we agree on simply observation. Morbid obesity: Planning to have bariatric surgery DM: Switched providers, sees endocrinologist at [facility] in few weeks as planning

## 2017-11-24 NOTE — Progress Notes (Signed)
Pre visit review using our clinic review tool, if applicable. No additional management support is needed unless otherwise documented below in the visit note. 

## 2017-11-25 NOTE — Assessment & Plan Note (Signed)
HTN, CAD, CHF. Was recently admitted with hypokalemia, hypomagnesemia.    We will recheck BMP and magnesium levels, otherwise continue with carvedilol, losartan, magnesium, Aldactone, potassium. Frequent PVCs: Per cardiology, they did a Holter monitor, results pending.  He is asx Bleeding SK: Currently with no problems, we talk about possibly referral to dermatology but we agree on simply observation. Morbid obesity: Planning to have bariatric surgery DM: Switched providers, sees endocrinologist at [facility] in few weeks as planning

## 2017-11-29 DIAGNOSIS — Z9989 Dependence on other enabling machines and devices: Secondary | ICD-10-CM | POA: Diagnosis not present

## 2017-11-29 DIAGNOSIS — G4733 Obstructive sleep apnea (adult) (pediatric): Secondary | ICD-10-CM | POA: Diagnosis not present

## 2017-12-06 ENCOUNTER — Other Ambulatory Visit (INDEPENDENT_AMBULATORY_CARE_PROVIDER_SITE_OTHER): Payer: PPO

## 2017-12-06 LAB — MAGNESIUM: Magnesium: 1.5 mg/dL (ref 1.5–2.5)

## 2017-12-06 MED FILL — TRANSDERM-SCOP 1.5 MG/3 DAY: 1 | 9 days supply | Qty: 3 | Fill #0

## 2017-12-07 MED ORDER — MAGNESIUM OXIDE 400 (241.3 MG) MG PO TABS: 400.0000 mg | ORAL_TABLET | Freq: Two times a day (BID) | ORAL | 3 refills | Status: DC

## 2017-12-07 NOTE — Addendum Note (Signed)
Addended byDamita Dunnings D on: 12/07/2017 10:15 AM   Modules accepted: Orders

## 2017-12-08 NOTE — Progress Notes (Signed)
Cardiology Office Note    Date:  12/09/2017   ID:  Randy Castro., DOB 1951-06-05, MRN [id number]  PCP:  Colon Branch, MD  Cardiologist: Rozann Lesches, MD    Chief Complaint  Patient presents with  . Hospitalization Follow-up    History of Present Illness:    Randy Castro. is a 67 y.o. male past medical history of CAD (s/p NSTEMI in 2012 with DES to mid LAD, recurrent cath in 2014 with PTCA alone to ISR of LAD, cath in 12/2012 showing patent stents along LAD with jailed 80% ostial diagonal stenosis), history of ischemic cardiomyopathy (EF 40% in 2012, improved to 55-60% by 2013), HTN, HLD, uncontrolled Type 2 DM, and OSA (on CPAP) who presents to the office today for hospital follow-up and preoperative cardiac clearance.  He was last examined by Dr. Domenic Polite in 02/2017 and reported overall doing well from a cardiac perspective at that time. He denied any recent chest pain or dyspnea on exertion and was continued on his current medication regimen.  In the interim, he was admitted to [facility] from 3/24 to 11/15/2017 for persistent bleeding from a skin tag and while in the ED he was noted to have frequent PVC's. Magnesium was low at 1.4 and appropriately replaced. He was monitored on telemetry with no sustained arrhythmias identified and an outpatient 24-hour Holter monitor was recommended to further clarify his PVC burden. This was obtained and showed normal sinus rhythm present throughout with heart rate varying from 49 bpm to 106 bpm with an average of 68 bpm. Was noted to have frequent PVC's and singlets and couplets and overall PVC volume was 13.6% of total beats. It was recommended that if he were symptomatically stable, he continue with his current medication regimen.  In talking with the patient today, he reports overall doing well since his recent hospitalization. Says that he was asymptomatic with his frequent ectopy. He denies any recent chest discomfort but does report having  baseline dyspnea on exertion. His activity is limited secondary to arthritis and he does not climb stairs or exercise regularly. No recent orthopnea, PND, or lower extremity edema.  He is planning to undergo gastric bypass surgery on 12/21/2017 and this is being performed by Dr. Demetrius Revel with [facility].   Past Medical History:  Diagnosis Date  . Arthritis   . CAD (coronary artery disease)    a. NSTEMI 12/12: EF 40-45%. [id number] balloon PTCA + Promus DES x 1 to mid LAD;  b. 11/2012: Cath with [place] balloon PTCA  LAD for ISR to LAD, EF 55%;  c. 12/2012 Cath/PCI: LAD stents patent, 80 ost Diag (jailed)->PTCA, LCX/RCA patent.  . Essential hypertension   . Hyperlipidemia   . Ischemic cardiomyopathy    a.  echo 08/06/11: dist ant wall, apical and septal and infero-apical HK, mild LVH, EF 40%, mild LAE, PASP 34, asc aorta mildly dilated, mild TR;  b.  Echo (3/13) showed recovery of LV systolic function with EF 30-07%, grade I diastolic dysfunction, mild MR.   . Type 2 diabetes mellitus ([place])     Past Surgical History:  Procedure Laterality Date  . ANTERIOR CERVICAL DECOMP/DISCECTOMY FUSION  in the 90s  . CORONARY ANGIOPLASTY  12/15/2012; 12/22/2012  . CORONARY ANGIOPLASTY WITH STENT PLACEMENT  07/2011   "1" (12/22/2012)  . KNEE ARTHROSCOPY Left 12/31/2016  . LEFT HEART CATHETERIZATION WITH CORONARY ANGIOGRAM N/A 08/06/2011   Procedure: LEFT HEART CATHETERIZATION WITH CORONARY ANGIOGRAM;  Surgeon: Annita Brod  Angelena Form, MD;  Location: [facility] CATH LAB;  Service: Cardiovascular;  Laterality: N/A;  . LEFT HEART CATHETERIZATION WITH CORONARY ANGIOGRAM N/A 12/15/2012   Procedure: LEFT HEART CATHETERIZATION WITH CORONARY ANGIOGRAM;  Surgeon: Sherren Mocha, MD;  Location: [facility] CATH LAB;  Service: Cardiovascular;  Laterality: N/A;  . LEFT HEART CATHETERIZATION WITH CORONARY ANGIOGRAM N/A 12/22/2012   Procedure: LEFT HEART CATHETERIZATION WITH CORONARY ANGIOGRAM;  Surgeon: Sherren Mocha, MD;  Location: [facility] CATH  LAB;  Service: Cardiovascular;  Laterality: N/A;  . PERCUTANEOUS CORONARY STENT INTERVENTION (PCI-S) Right 12/15/2012   Procedure: PERCUTANEOUS CORONARY STENT INTERVENTION (PCI-S);  Surgeon: Sherren Mocha, MD;  Location: [facility] CATH LAB;  Service: Cardiovascular;  Laterality: Right;    Current Medications: Outpatient Medications Prior to Visit  Medication Sig Dispense Refill  . acetaminophen (TYLENOL) 500 MG tablet Take 500 mg by mouth every 6 (six) hours as needed.    Marland Kitchen aspirin EC 81 MG tablet Take 81 mg by mouth daily.    Marland Kitchen atorvastatin (LIPITOR) 80 MG tablet Take 1 tablet (80 mg total) by mouth daily. 90 tablet 3  . azelastine (ASTELIN) 0.1 % nasal spray Place 1-2 sprays into both nostrils 2 (two) times daily. Use in each nostril as directed 30 mL 5  . BD PEN NEEDLE NANO U/F 32G X 4 MM MISC USE FOR INJECTIONS FOUR TIMES DAILY 300 each 1  . carvedilol (COREG) 12.5 MG tablet TAKE 1 TABLET BY MOUTH TWICE DAILY 60 tablet 6  . citalopram (CELEXA) 20 MG tablet Take 1 tablet (20 mg total) daily by mouth. 90 tablet 1  . clopidogrel (PLAVIX) 75 MG tablet TAKE 1 TABLET BY MOUTH EVERY DAY 90 tablet 3  . clotrimazole-betamethasone (LOTRISONE) cream Apply topically 2 (two) times daily. (Patient taking differently: Apply topically 2 (two) times daily as needed. ) 30 g 0  . famotidine (PEPCID) 20 MG tablet Take 20 mg by mouth 2 (two) times daily as needed for heartburn.    . fenofibrate (TRICOR) 145 MG tablet TAKE 1 TABLET BY MOUTH EVERY DAY 30 tablet 6  . glucose blood test strip Use 3x a day - for OneTouch Ultra mini 200 each 11  . insulin regular (NOVOLIN R RELION) 100 units/mL injection TID after meals, pt not sure # of units (Patient taking differently: Inject 40-45 Units into the skin 3 (three) times daily before meals. 45 UNITS AT LUNCH AND 45 UNITS AT NIGHT) 20 mL 0  . losartan (COZAAR) 100 MG tablet Take 1 tablet (100 mg total) by mouth daily. 90 tablet 2  . magnesium oxide (MAG-OX) 400 (241.3 Mg) MG  tablet Take 1 tablet (400 mg total) by mouth 2 (two) times daily. 60 tablet 3  . metFORMIN (GLUCOPHAGE-XR) 500 MG 24 hr tablet Take 1 tablet (500 mg total) by mouth 3 (three) times daily with meals. 270 tablet 3  . Multiple Vitamin (MULTIVITAMIN WITH MINERALS) TABS Take 1 tablet by mouth daily.    . nitroGLYCERIN (NITROSTAT) 0.4 MG SL tablet Place 1 tablet (0.4 mg total) under the tongue every 5 (five) minutes as needed for chest pain. 100 tablet 3  . omega-3 acid ethyl esters (LOVAZA) 1 g capsule Take 2 capsules (2 g total) by mouth 2 (two) times daily. 120 capsule 11  . omeprazole (PRILOSEC) 20 MG capsule Take 1 tablet by mouth twice a day.    . ondansetron (ZOFRAN-ODT) 4 MG disintegrating tablet Take 1 tablet by mouth every 8 (eight) hours as needed for Nausea. For post- op use    .  Potassium 99 MG TABS Take 1 tablet by mouth daily.    Marland Kitchen spironolactone (ALDACTONE) 25 MG tablet Take 0.5 tablets (12.5 mg total) by mouth daily. 45 tablet 3   No facility-administered medications prior to visit.      Allergies:   Trulicity [dulaglutide]   Social History   Socioeconomic History  . Marital status: Married    Spouse name: Not on file  . Number of children: 2   . Years of education: Not on file  . Highest education level: Not on file  Occupational History  . Occupation: Audiological scientist  Social Needs  . Financial resource strain: Not on file  . Food insecurity:    Worry: Not on file    Inability: Not on file  . Transportation needs:    Medical: Not on file    Non-medical: Not on file  Tobacco Use  . Smoking status: Former Smoker    Packs/day: 1.00    Years: 25.00    Pack years: 25.00    Types: Cigarettes    Last attempt to quit: 04/12/1992    Years since quitting: 25.6  . Smokeless tobacco: Never Used  Substance and Sexual Activity  . Alcohol use: Yes    Comment: 12/22/2012 "rarely maybe every 3-4 months, beer"  . Drug use: No  . Sexual activity: Yes  Lifestyle  .  Physical activity:    Days per week: Not on file    Minutes per session: Not on file  . Stress: Not on file  Relationships  . Social connections:    Talks on phone: Not on file    Gets together: Not on file    Attends religious service: Not on file    Active member of club or organization: Not on file    Attends meetings of clubs or organizations: Not on file    Relationship status: Not on file  Other Topics Concern  . Not on file  Social History Narrative   Moved back to [place] from [place]-- pt and wife     Family History:  The patient's family history includes Coronary artery disease in his father; Diabetes in his brother, mother, and sister; Heart attack (age of onset: 46) in his father; Lung cancer in his brother.   Review of Systems:   Please see the history of present illness.     General:  No chills, fever, night sweats or weight changes.  Cardiovascular:  No chest pain, edema, orthopnea, palpitations, paroxysmal nocturnal dyspnea. Positive for dyspnea on exertion.  Dermatological: No rash, lesions/masses Respiratory: No cough, dyspnea Urologic: No hematuria, dysuria Abdominal:   No nausea, vomiting, diarrhea, bright red blood per rectum, melena, or hematemesis Neurologic:  No visual changes, wkns, changes in mental status. All other systems reviewed and are otherwise negative except as noted above.   Physical Exam:    VS:  BP (!) 146/74 (BP Location: Left Arm)   Pulse (!) 56   Ht 6\' 2"  (1.88 m)   Wt 299 lb (135.6 kg)   SpO2 94%   BMI 38.39 kg/m    General: Well developed, obese Caucasian male appearing in no acute distress. Head: Normocephalic, atraumatic, sclera non-icteric, no xanthomas, nares are without discharge.  Neck: No carotid bruits. JVD not elevated.  Lungs: Respirations regular and unlabored, without wheezes or rales.  Heart: Regular rate and rhythm with occasional ectopic beats. No S3 or S4.  No murmur, no rubs, or gallops  appreciated. Abdomen: Soft, non-tender,  non-distended with normoactive bowel sounds. No hepatomegaly. No rebound/guarding. No obvious abdominal masses. Msk:  Strength and tone appear normal for age. No joint deformities or effusions. Extremities: No clubbing or cyanosis. No lower extremity edema.  Distal pedal pulses are 2+ bilaterally. Neuro: Alert and oriented X 3. Moves all extremities spontaneously. No focal deficits noted. Psych:  Responds to questions appropriately with a normal affect. Skin: No rashes or lesions noted  Wt Readings from Last 3 Encounters:  12/09/17 299 lb (135.6 kg)  11/24/17 (!) 307 lb 4 oz (139.4 kg)  11/14/17 294 lb (133.4 kg)     Studies/Labs Reviewed:   EKG:  EKG is not ordered today.  Recent Labs: 04/27/2017: ALT 19 11/15/2017: Hemoglobin 11.7; Platelets 238 11/24/2017: BUN 13; Creatinine, Ser 0.79; Potassium 3.8; Sodium 138 12/06/2017: Magnesium 1.5   Lipid Panel    Component Value Date/Time   CHOL 189 04/27/2017 1019   TRIG (H) 04/27/2017 1019    464.0 Triglyceride is over 400; calculations on Lipids are invalid.   HDL 32.80 (L) 04/27/2017 1019   CHOLHDL 6 04/27/2017 1019   VLDL 69 (H) 05/18/2016 0828   LDLCALC 46 05/18/2016 0828   LDLDIRECT 74.0 04/27/2017 1019    Additional studies/ records that were reviewed today include:   Echocardiogram: 02/2017 Study Conclusions  - Left ventricle: The cavity size was normal. Wall thickness was   increased in a pattern of mild LVH. Systolic function was normal.   The estimated ejection fraction was in the range of 50% to 55%.   Wall motion was normal; there were no regional wall motion   abnormalities. Doppler parameters are consistent with abnormal   left ventricular relaxation (grade 1 diastolic dysfunction). - Aortic valve: Valve area (VTI): 3.26 cm^2. Valve area (Vmax):   3.26 cm^2. Valve area (Vmean): 2.74 cm^2. - Aorta: Aortic root dimension: 40 mm (ED). - Aortic root: The aortic root was mildly  dilated. - Technically adequate studty.  Holter Monitor: 11/18/2017 24-hour Holter monitor reviewed.  Sinus rhythm is present throughout.  Heart rate ranged from 49 bpm up to 106 bpm with average heart rate 68 bpm.  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  PVC volume represented 13.6% of total beats.  Assessment:    1. Coronary artery disease involving native coronary artery of native heart without angina pectoris   2. History of cardiomyopathy   3. Essential hypertension   4. Mixed hyperlipidemia   5. PVC's (premature ventricular contractions)   6. Preoperative cardiovascular examination      Plan:   In order of problems listed above:  1. CAD - s/p NSTEMI in 2012 with DES to mid LAD, recurrent cath in 2014 with PTCA alone to ISR of LAD and most recent catheterization in 12/2012 showing patent stents along LAD with jailed 80% ostial diagonal stenosis. - he has baseline dyspnea on exertion but denies any recent chest pain. Given the need for preoperative cardiac clearance prior to upcoming gastric bypass surgery, will plan for ischemic evaluation with a Lexiscan Myoview as he has not undergone ischemic evaluation in 5 years.  - continue ASA, Plavix, BB, and statin therapy.   2. History of ischemic cardiomyopathy - EF 40% in 2012, improved to 55-60% by 2013. Most recent echo in 02/2017 showed EF of 50-55%. - appears euvolemic by examination today. - continue BB and ARB.   3. HTN - BP is at 146/74 during today's visit.  - will continue to follow with upcoming  gastric bypass surgery as he may require dose adjustments with anticipated weight loss.   4. HLD - Followed by PCP. Goal LDL is < 70 with known CAD. - continue Atorvastatin 80mg  daily.   5. PVC's - recent Holter monitor showed his overall PVC volume was 13.6% of total beats. He has been asymptomatic with this and HR is in the 50's at times.  - will continue Coreg 12.5mg  BID.   6.  Preoperative Cardiac Clearance for Gastric Bypass - planning to undergo gastric bypass surgery on 12/21/2017 and this is being performed by Dr. Demetrius Revel with [facility].  - it is difficult to assess the patient's functional status given his arthritis and baseline dyspnea on exertion. Will plan for a Lexiscan Myoview prior to testing (will arrange for next week) as previously recommended by Dr. Domenic Polite.    Medication Adjustments/Labs and Tests Ordered: Current medicines are reviewed at length with the patient today.  Concerns regarding medicines are outlined above.  Medication changes, Labs and Tests ordered today are listed in the Patient Instructions below. Patient Instructions  Medication Instructions:  Your physician recommends that you continue on your current medications as directed. Please refer to the Current Medication list given to you today.   Labwork: NONE  Testing/Procedures: Your physician has requested that you have a lexiscan myoview. For further information please visit [email or web address]. Please follow instruction sheet, as given.  Follow-Up: Your physician wants you to follow-up in: 6 MONTHS.  You will receive a reminder letter in the mail two months in advance. If you don't receive a letter, please call our office to schedule the follow-up appointment.  Any Other Special Instructions Will Be Listed Below (If Applicable).  If you need a refill on your cardiac medications before your next appointment, please call your pharmacy.    Signed, Erma Heritage, PA-C  12/09/2017 7:03 PM    [place] [address] [place], [place] [address] Phone: [phone]

## 2017-12-09 ENCOUNTER — Encounter: Payer: Self-pay | Admitting: Student

## 2017-12-09 ENCOUNTER — Ambulatory Visit: Payer: PPO | Admitting: Student

## 2017-12-09 VITALS — BP 146/74 | HR 56 | Ht 74.0 in | Wt 299.0 lb

## 2017-12-09 DIAGNOSIS — Z8679 Personal history of other diseases of the circulatory system: Secondary | ICD-10-CM | POA: Diagnosis not present

## 2017-12-09 DIAGNOSIS — I493 Ventricular premature depolarization: Secondary | ICD-10-CM

## 2017-12-09 DIAGNOSIS — E782 Mixed hyperlipidemia: Secondary | ICD-10-CM

## 2017-12-09 DIAGNOSIS — I2510 Atherosclerotic heart disease of native coronary artery without angina pectoris: Secondary | ICD-10-CM | POA: Diagnosis not present

## 2017-12-09 DIAGNOSIS — G4733 Obstructive sleep apnea (adult) (pediatric): Secondary | ICD-10-CM | POA: Diagnosis not present

## 2017-12-09 DIAGNOSIS — I10 Essential (primary) hypertension: Secondary | ICD-10-CM

## 2017-12-09 DIAGNOSIS — Z01810 Encounter for preprocedural cardiovascular examination: Secondary | ICD-10-CM

## 2017-12-09 NOTE — Patient Instructions (Signed)
Medication Instructions:  Your physician recommends that you continue on your current medications as directed. Please refer to the Current Medication list given to you today.   Labwork: NONE  Testing/Procedures: Your physician has requested that you have a lexiscan myoview. For further information please visit [email or web address]. Please follow instruction sheet, as given.    Follow-Up: Your physician wants you to follow-up in: 6 MONTHS.  You will receive a reminder letter in the mail two months in advance. If you don't receive a letter, please call our office to schedule the follow-up appointment.    Any Other Special Instructions Will Be Listed Below (If Applicable).     If you need a refill on your cardiac medications before your next appointment, please call your pharmacy.   

## 2017-12-14 DIAGNOSIS — E1165 Type 2 diabetes mellitus with hyperglycemia: Secondary | ICD-10-CM | POA: Diagnosis not present

## 2017-12-14 DIAGNOSIS — Z794 Long term (current) use of insulin: Secondary | ICD-10-CM | POA: Diagnosis not present

## 2017-12-15 ENCOUNTER — Encounter (HOSPITAL_BASED_OUTPATIENT_CLINIC_OR_DEPARTMENT_OTHER)
Admission: RE | Admit: 2017-12-15 | Discharge: 2017-12-15 | Disposition: A | Discharge status: Home / Self Care | Payer: PPO | Source: Ambulatory Visit | Attending: Student | Admitting: Student

## 2017-12-15 ENCOUNTER — Encounter (HOSPITAL_COMMUNITY)
Admission: RE | Admit: 2017-12-15 | Discharge: 2017-12-15 | Disposition: A | Discharge status: Home / Self Care | Payer: PPO | Source: Ambulatory Visit | Attending: Student | Admitting: Student

## 2017-12-15 ENCOUNTER — Encounter (HOSPITAL_COMMUNITY): Payer: Self-pay

## 2017-12-15 DIAGNOSIS — Z01810 Encounter for preprocedural cardiovascular examination: Secondary | ICD-10-CM | POA: Insufficient documentation

## 2017-12-15 DIAGNOSIS — I2510 Atherosclerotic heart disease of native coronary artery without angina pectoris: Secondary | ICD-10-CM | POA: Insufficient documentation

## 2017-12-15 LAB — NM MYOCAR MULTI W/SPECT W/WALL MOTION / EF
CHL CUP NUCLEAR SDS: 0
CHL CUP NUCLEAR SRS: 7
CHL CUP RESTING HR STRESS: 61 {beats}/min
LHR: 0.48
LV dias vol: 220 mL (ref 62–150)
LVSYSVOL: 150 mL
Peak HR: 80 {beats}/min
SSS: 7
TID: 1.03

## 2017-12-15 MED ORDER — TECHNETIUM TC 99M TETROFOSMIN IV KIT
10.0000 | PACK | Freq: Once | INTRAVENOUS | Status: AC | PRN
  Administered 2017-12-15: 11 via INTRAVENOUS

## 2017-12-15 MED ORDER — TECHNETIUM TC 99M TETROFOSMIN IV KIT
30.0000 | PACK | Freq: Once | INTRAVENOUS | Status: AC | PRN
  Administered 2017-12-15: 33 via INTRAVENOUS

## 2017-12-15 MED ORDER — SODIUM CHLORIDE 0.9% FLUSH
INTRAVENOUS | Status: AC
  Administered 2017-12-15: 10 mL via INTRAVENOUS
  Filled 2017-12-15: qty 10

## 2017-12-15 MED ORDER — REGADENOSON 0.4 MG/5ML IV SOLN
INTRAVENOUS | Status: AC
  Administered 2017-12-15: 0.4 mg via INTRAVENOUS
  Filled 2017-12-15: qty 5

## 2017-12-21 DIAGNOSIS — E1159 Type 2 diabetes mellitus with other circulatory complications: Secondary | ICD-10-CM | POA: Diagnosis not present

## 2017-12-21 DIAGNOSIS — I252 Old myocardial infarction: Secondary | ICD-10-CM | POA: Diagnosis not present

## 2017-12-21 DIAGNOSIS — I509 Heart failure, unspecified: Secondary | ICD-10-CM | POA: Diagnosis not present

## 2017-12-21 DIAGNOSIS — Z7902 Long term (current) use of antithrombotics/antiplatelets: Secondary | ICD-10-CM | POA: Diagnosis not present

## 2017-12-21 DIAGNOSIS — Z87891 Personal history of nicotine dependence: Secondary | ICD-10-CM | POA: Diagnosis not present

## 2017-12-21 DIAGNOSIS — I517 Cardiomegaly: Secondary | ICD-10-CM | POA: Diagnosis not present

## 2017-12-21 DIAGNOSIS — Z6839 Body mass index (BMI) 39.0-39.9, adult: Secondary | ICD-10-CM | POA: Diagnosis not present

## 2017-12-21 DIAGNOSIS — K6389 Other specified diseases of intestine: Secondary | ICD-10-CM | POA: Diagnosis not present

## 2017-12-21 DIAGNOSIS — I10 Essential (primary) hypertension: Secondary | ICD-10-CM | POA: Diagnosis not present

## 2017-12-21 DIAGNOSIS — Z794 Long term (current) use of insulin: Secondary | ICD-10-CM | POA: Diagnosis not present

## 2017-12-21 DIAGNOSIS — I440 Atrioventricular block, first degree: Secondary | ICD-10-CM | POA: Diagnosis not present

## 2017-12-21 DIAGNOSIS — G4733 Obstructive sleep apnea (adult) (pediatric): Secondary | ICD-10-CM | POA: Diagnosis not present

## 2017-12-21 DIAGNOSIS — E785 Hyperlipidemia, unspecified: Secondary | ICD-10-CM | POA: Diagnosis not present

## 2017-12-21 DIAGNOSIS — I110 Hypertensive heart disease with heart failure: Secondary | ICD-10-CM | POA: Diagnosis not present

## 2017-12-21 DIAGNOSIS — Z6838 Body mass index (BMI) 38.0-38.9, adult: Secondary | ICD-10-CM | POA: Diagnosis not present

## 2017-12-21 DIAGNOSIS — I4510 Unspecified right bundle-branch block: Secondary | ICD-10-CM | POA: Diagnosis not present

## 2017-12-21 DIAGNOSIS — E119 Type 2 diabetes mellitus without complications: Secondary | ICD-10-CM | POA: Diagnosis not present

## 2017-12-21 DIAGNOSIS — I2510 Atherosclerotic heart disease of native coronary artery without angina pectoris: Secondary | ICD-10-CM | POA: Diagnosis not present

## 2017-12-21 DIAGNOSIS — I491 Atrial premature depolarization: Secondary | ICD-10-CM | POA: Diagnosis not present

## 2017-12-21 DIAGNOSIS — F419 Anxiety disorder, unspecified: Secondary | ICD-10-CM | POA: Diagnosis not present

## 2017-12-21 HISTORY — PX: GASTRIC BYPASS: SHX52

## 2017-12-23 MED ORDER — INSULIN LISPRO 100 UNIT/ML ~~LOC~~ SOLN
3.00 | SUBCUTANEOUS | Status: DC
Start: 2017-12-23 — End: 2017-12-23

## 2017-12-23 MED ORDER — SODIUM CHLORIDE 0.9 % IV SOLN
INTRAVENOUS | Status: DC
Start: ? — End: 2017-12-23

## 2017-12-23 MED ORDER — GENERIC EXTERNAL MEDICATION
1.00 | Status: DC
Start: ? — End: 2017-12-23

## 2017-12-23 MED ORDER — MORPHINE SULFATE (PF) 2 MG/ML IV SOLN
2.00 | INTRAVENOUS | Status: DC
Start: ? — End: 2017-12-23

## 2017-12-23 MED ORDER — CITALOPRAM HYDROBROMIDE 20 MG PO TABS
20.00 | ORAL_TABLET | ORAL | Status: DC
Start: 2017-12-24 — End: 2017-12-23

## 2017-12-23 MED ORDER — FAMOTIDINE 20 MG/2ML IV SOLN
20.00 | INTRAVENOUS | Status: DC
Start: 2017-12-23 — End: 2017-12-23

## 2017-12-23 MED ORDER — MAGNESIUM OXIDE 400 MG PO TABS
200.00 | ORAL_TABLET | ORAL | Status: DC
Start: 2017-12-23 — End: 2017-12-23

## 2017-12-23 MED ORDER — CARVEDILOL 12.5 MG PO TABS
12.50 | ORAL_TABLET | ORAL | Status: DC
Start: 2017-12-23 — End: 2017-12-23

## 2017-12-23 MED ORDER — GLUCOSE 40 % PO GEL
15.00 g | ORAL | Status: DC
Start: ? — End: 2017-12-23

## 2017-12-23 MED ORDER — ONDANSETRON HCL 4 MG/2ML IJ SOLN
4.00 | INTRAMUSCULAR | Status: DC
Start: ? — End: 2017-12-23

## 2017-12-23 MED ORDER — ACETAMINOPHEN 325 MG PO TABS
325.00 | ORAL_TABLET | ORAL | Status: DC
Start: ? — End: 2017-12-23

## 2017-12-23 MED ORDER — DEXTROSE 50 % IV SOLN
12.00 g | INTRAVENOUS | Status: DC
Start: ? — End: 2017-12-23

## 2017-12-23 MED ORDER — ENOXAPARIN SODIUM 40 MG/0.4ML ~~LOC~~ SOLN
40.00 | SUBCUTANEOUS | Status: DC
Start: 2017-12-24 — End: 2017-12-23

## 2017-12-23 MED ORDER — HYDROCODONE-ACETAMINOPHEN 7.5-325 MG/15ML PO SOLN
15.00 | ORAL | Status: DC
Start: ? — End: 2017-12-23

## 2017-12-24 ENCOUNTER — Other Ambulatory Visit: Payer: Self-pay

## 2017-12-24 NOTE — Patient Outreach (Signed)
[facility]) Care Management  12/24/2017  Randy Castro. 06/13/1951 [id number]  Transition of care  Referral date: 12/24/17 Referral source: discharged from [facility] on 12/23/17 Insurance: health team advantage   Transition of care will be completed by primary care provider office who will refer to [facility] care management if needed.   PLAN:  RNCM will close patient due to patient being enrolled in an external program.   Quinn Plowman RN,BSN,CCM [facility] Telephonic  [phone]

## 2017-12-27 NOTE — Addendum Note (Signed)
Addended by: Jiles Harold on: 12/27/2017 08:51 AM   Modules accepted: Level of Service, SmartSet

## 2017-12-27 NOTE — Progress Notes (Signed)
This encounter was created in error - please disregard.

## 2017-12-30 ENCOUNTER — Telehealth: Payer: Self-pay | Admitting: Internal Medicine

## 2017-12-31 NOTE — Telephone Encounter (Signed)
Okay to refill citalopram 20 mg, more than 20 mg is not recommended with concomitant use of omeprazole due to QT prolongation issues.

## 2017-12-31 NOTE — Telephone Encounter (Signed)
Interaction between citalopram and omeprazole. Please advise.

## 2018-01-10 DIAGNOSIS — G4733 Obstructive sleep apnea (adult) (pediatric): Secondary | ICD-10-CM | POA: Diagnosis not present

## 2018-01-24 ENCOUNTER — Encounter: Payer: Self-pay | Admitting: Medical

## 2018-01-24 ENCOUNTER — Other Ambulatory Visit: Payer: Self-pay | Admitting: Medical

## 2018-01-24 ENCOUNTER — Ambulatory Visit (INDEPENDENT_AMBULATORY_CARE_PROVIDER_SITE_OTHER): Payer: PPO | Admitting: Medical

## 2018-01-24 VITALS — BP 138/73 | HR 58 | Temp 98.6°F | Resp 16 | Ht 74.0 in | Wt 261.0 lb

## 2018-01-24 DIAGNOSIS — H938X3 Other specified disorders of ear, bilateral: Secondary | ICD-10-CM

## 2018-01-24 DIAGNOSIS — H60501 Unspecified acute noninfective otitis externa, right ear: Secondary | ICD-10-CM

## 2018-01-24 MED ORDER — AZITHROMYCIN 250 MG PO TABS: ORAL_TABLET | ORAL | 0 refills | Status: DC

## 2018-01-24 MED ORDER — FLUTICASONE PROPIONATE 50 MCG/ACT NA SUSP: 2.0000 | Freq: Every day | NASAL | 1 refills | Status: DC

## 2018-01-24 MED ORDER — NEOMYCIN-POLYMYXIN-HC 1 % OT SOLN: 3.0000 [drp] | Freq: Three times a day (TID) | OTIC | 0 refills | Status: DC

## 2018-01-24 NOTE — Patient Instructions (Addendum)
You do appear to have otitis externa on exam.(Your ear canal does look inflamed/swollen.)  I am prescribing Cortisporin otic drops.  Use as directed.  If you have any worsening pain despite using Cortisporin then fill the oral antibiotic azithromycin.  Print prescription given today.  For your described ear pressure, I am prescribing Flonase nasal spray.  Your symptoms should gradually improve but if any worsening or changing signs and symptoms please let me know.  Follow-up in 7 to 10 days or as needed.

## 2018-01-24 NOTE — Progress Notes (Signed)
Subjective:    Patient ID: Randy Castro., male    DOB: [birth year], 67 y.o.   MRN: [id number]  HPI   Pt in with some rt ear pain, popping and fluid sensation.   States 2-3 weeks off and on/mild and transient. Now feeling more constant.  Mild ringing on and off. Rt ear hearing is mild decreased.  Occasional will have deep pain and pressure.  No fever,no chills or sweats.  Not reporting any obvious allergy type signs or symptoms    Review of Systems  Constitutional: Negative for chills, fatigue and fever.  HENT: Positive for ear pain. Negative for congestion, postnasal drip, sinus pressure, sinus pain, sneezing, sore throat and trouble swallowing.        Ear pressure.  Respiratory: Negative for cough, choking, chest tightness, shortness of breath, wheezing and stridor.   Cardiovascular: Negative for chest pain and palpitations.  Musculoskeletal: Negative for back pain.  Neurological: Negative for dizziness, syncope, speech difficulty, weakness, light-headedness, numbness and headaches.  Hematological: Negative for adenopathy. Does not bruise/bleed easily.    Past Medical History:  Diagnosis Date  . Arthritis   . CAD (coronary artery disease)    a. NSTEMI 12/12: EF 40-45%. PJA:SNKNLZJ balloon PTCA + Promus DES x 1 to mid LAD;  b. 11/2012: Cath with Cutting balloon PTCA  LAD for ISR to LAD, EF 55%;  c. 12/2012 Cath/PCI: LAD stents patent, 80 ost Diag (jailed)->PTCA, LCX/RCA patent.  . Essential hypertension   . Hyperlipidemia   . Ischemic cardiomyopathy    a.  echo 08/06/11: dist ant wall, apical and septal and infero-apical HK, mild LVH, EF 40%, mild LAE, PASP 34, asc aorta mildly dilated, mild TR;  b.  Echo (3/13) showed recovery of LV systolic function with EF 67-34%, grade I diastolic dysfunction, mild MR.   . Type 2 diabetes mellitus ([place])      Social History   Socioeconomic History  . Marital status: Married    Spouse name: Not on file  . Number of children: 2     . Years of education: Not on file  . Highest education level: Not on file  Occupational History  . Occupation: Audiological scientist  Social Needs  . Financial resource strain: Not on file  . Food insecurity:    Worry: Not on file    Inability: Not on file  . Transportation needs:    Medical: Not on file    Non-medical: Not on file  Tobacco Use  . Smoking status: Former Smoker    Packs/day: 1.00    Years: 25.00    Pack years: 25.00    Types: Cigarettes    Last attempt to quit: 04/12/1992    Years since quitting: 25.8  . Smokeless tobacco: Never Used  Substance and Sexual Activity  . Alcohol use: Yes    Comment: 12/22/2012 "rarely maybe every 3-4 months, beer"  . Drug use: No  . Sexual activity: Yes  Lifestyle  . Physical activity:    Days per week: Not on file    Minutes per session: Not on file  . Stress: Not on file  Relationships  . Social connections:    Talks on phone: Not on file    Gets together: Not on file    Attends religious service: Not on file    Active member of club or organization: Not on file    Attends meetings of clubs or organizations: Not on file    Relationship status: Not on  file  . Intimate partner violence:    Fear of current or ex partner: Not on file    Emotionally abused: Not on file    Physically abused: Not on file    Forced sexual activity: Not on file  Other Topics Concern  . Not on file  Social History Narrative   Moved back to [place] from [place]-- pt and wife    Past Surgical History:  Procedure Laterality Date  . ANTERIOR CERVICAL DECOMP/DISCECTOMY FUSION  in the 90s  . CORONARY ANGIOPLASTY  12/15/2012; 12/22/2012  . CORONARY ANGIOPLASTY WITH STENT PLACEMENT  07/2011   "1" (12/22/2012)  . KNEE ARTHROSCOPY Left 12/31/2016  . LEFT HEART CATHETERIZATION WITH CORONARY ANGIOGRAM N/A 08/06/2011   Procedure: LEFT HEART CATHETERIZATION WITH CORONARY ANGIOGRAM;  Surgeon: Burnell Blanks, MD;  Location: [facility] CATH LAB;   Service: Cardiovascular;  Laterality: N/A;  . LEFT HEART CATHETERIZATION WITH CORONARY ANGIOGRAM N/A 12/15/2012   Procedure: LEFT HEART CATHETERIZATION WITH CORONARY ANGIOGRAM;  Surgeon: Sherren Mocha, MD;  Location: [facility] CATH LAB;  Service: Cardiovascular;  Laterality: N/A;  . LEFT HEART CATHETERIZATION WITH CORONARY ANGIOGRAM N/A 12/22/2012   Procedure: LEFT HEART CATHETERIZATION WITH CORONARY ANGIOGRAM;  Surgeon: Sherren Mocha, MD;  Location: [facility] CATH LAB;  Service: Cardiovascular;  Laterality: N/A;  . PERCUTANEOUS CORONARY STENT INTERVENTION (PCI-S) Right 12/15/2012   Procedure: PERCUTANEOUS CORONARY STENT INTERVENTION (PCI-S);  Surgeon: Sherren Mocha, MD;  Location: [facility] CATH LAB;  Service: Cardiovascular;  Laterality: Right;    Family History  Problem Relation Age of Onset  . Diabetes Brother   . Lung cancer Brother   . Diabetes Mother   . Coronary artery disease Father   . Heart attack Father 43  . Diabetes Sister   . Colon cancer Neg Hx   . Prostate cancer Neg Hx   . Stroke Neg Hx     Allergies  Allergen Reactions  . Trulicity [Dulaglutide] Nausea And Vomiting    Current Outpatient Medications on File Prior to Visit  Medication Sig Dispense Refill  . azelastine (ASTELIN) 0.1 % nasal spray Place 1-2 sprays into both nostrils 2 (two) times daily. Use in each nostril as directed 30 mL 5  . citalopram (CELEXA) 20 MG tablet Take 1 tablet (20 mg total) by mouth daily. 90 tablet 1  . clopidogrel (PLAVIX) 75 MG tablet TAKE 1 TABLET BY MOUTH EVERY DAY 90 tablet 3  . clotrimazole-betamethasone (LOTRISONE) cream Apply topically 2 (two) times daily. (Patient taking differently: Apply topically 2 (two) times daily as needed. ) 30 g 0  . famotidine (PEPCID) 20 MG tablet Take 20 mg by mouth 2 (two) times daily as needed for heartburn.    . magnesium oxide (MAG-OX) 400 (241.3 Mg) MG tablet Take 1 tablet (400 mg total) by mouth 2 (two) times daily. 60 tablet 3  . nitroGLYCERIN (NITROSTAT) 0.4 MG  SL tablet Place 1 tablet (0.4 mg total) under the tongue every 5 (five) minutes as needed for chest pain. 100 tablet 3  . omeprazole (PRILOSEC) 20 MG capsule Take 1 tablet by mouth twice a day.    . ondansetron (ZOFRAN-ODT) 4 MG disintegrating tablet Take 1 tablet by mouth every 8 (eight) hours as needed for Nausea. For post- op use    . Potassium 99 MG TABS Take 1 tablet by mouth daily.     No current facility-administered medications on file prior to visit.     BP 138/73   Pulse (!) 58  Temp 98.6 F (37 C) (Oral)   Resp 16   Ht 6\' 2"  (1.88 m)   Wt 261 lb (118.4 kg)   SpO2 98%   BMI 33.51 kg/m       Objective:   Physical Exam  General  Mental Status - Alert. General Appearance - Well groomed. Not in acute distress.  Skin Rashes- No Rashes.  HEENT Head- Normal. Ear Auditory Canal - Left- small amount of wax seen left side. Right - moderate swelling. Moderate tragal tenderenss.Tympanic Membrane- Left- Normal. Right- small area of redness direct center.(rt side tragal tenderness.) Eye Sclera/Conjunctiva- Left- Normal. Right- Normal. Nose & Sinuses Nasal Mucosa- Left-  Boggy and Congested. Right-  Boggy and  Congested.Bilateral  No maxillary and no  frontal sinus pressure. Mouth & Throat Lips: Upper Lip- Normal: no dryness, cracking, pallor, cyanosis, or vesicular eruption. Lower Lip-Normal: no dryness, cracking, pallor, cyanosis or vesicular eruption. Buccal Mucosa- Bilateral- No Aphthous ulcers. Oropharynx- No Discharge or Erythema. Tonsils: Characteristics- Bilateral- No Erythema or Congestion. Size/Enlargement- Bilateral- No enlargement. Discharge- bilateral-None.  Neck Neck- Supple. No Masses.   Chest and Lung Exam Auscultation: Breath Sounds:-Clear even and unlabored.  Cardiovascular Auscultation:Rythm- Regular, rate and rhythm. Murmurs & Other Heart Sounds:Ausculatation of the heart reveal- No Murmurs.  Lymphatic Head & Neck General Head & Neck  Lymphatics: Bilateral: Description- No Localized lymphadenopathy.       Assessment & Plan:  You do appear to have otitis externa on exam.(Your ear canal does look inflamed/swollen.)  I am prescribing Cortisporin otic drops.  Use as directed.  If you have any worsening pain despite using Cortisporin then fill the oral antibiotic azithromycin.  Print prescription given today.  For your described ear pressure, I am prescribing Flonase nasal spray.  Your symptoms should gradually improve but if any worsening or changing signs and symptoms please let me know.  Follow-up in 7 to 10 days or as needed.  Mackie Pai, PA-C

## 2018-01-25 ENCOUNTER — Other Ambulatory Visit: Payer: Self-pay | Admitting: *Deleted

## 2018-01-25 MED ORDER — CARVEDILOL 12.5 MG PO TABS: 12.5000 mg | ORAL_TABLET | Freq: Two times a day (BID) | ORAL | 3 refills | Status: DC

## 2018-02-10 DIAGNOSIS — G4733 Obstructive sleep apnea (adult) (pediatric): Secondary | ICD-10-CM | POA: Diagnosis not present

## 2018-02-25 ENCOUNTER — Encounter: Payer: PPO | Admitting: Internal Medicine

## 2018-02-28 DIAGNOSIS — Z9989 Dependence on other enabling machines and devices: Secondary | ICD-10-CM | POA: Diagnosis not present

## 2018-02-28 DIAGNOSIS — G4733 Obstructive sleep apnea (adult) (pediatric): Secondary | ICD-10-CM | POA: Diagnosis not present

## 2018-03-12 DIAGNOSIS — G4733 Obstructive sleep apnea (adult) (pediatric): Secondary | ICD-10-CM | POA: Diagnosis not present

## 2018-03-28 DIAGNOSIS — I214 Non-ST elevation (NSTEMI) myocardial infarction: Secondary | ICD-10-CM | POA: Diagnosis not present

## 2018-03-28 DIAGNOSIS — E785 Hyperlipidemia, unspecified: Secondary | ICD-10-CM | POA: Diagnosis not present

## 2018-03-28 DIAGNOSIS — E669 Obesity, unspecified: Secondary | ICD-10-CM | POA: Diagnosis not present

## 2018-03-28 DIAGNOSIS — I10 Essential (primary) hypertension: Secondary | ICD-10-CM | POA: Diagnosis not present

## 2018-03-28 DIAGNOSIS — Z9884 Bariatric surgery status: Secondary | ICD-10-CM | POA: Diagnosis not present

## 2018-04-12 DIAGNOSIS — G4733 Obstructive sleep apnea (adult) (pediatric): Secondary | ICD-10-CM | POA: Diagnosis not present

## 2018-04-26 ENCOUNTER — Ambulatory Visit (INDEPENDENT_AMBULATORY_CARE_PROVIDER_SITE_OTHER): Payer: PPO | Admitting: Internal Medicine

## 2018-04-26 ENCOUNTER — Encounter: Payer: Self-pay | Admitting: Internal Medicine

## 2018-04-26 DIAGNOSIS — I2510 Atherosclerotic heart disease of native coronary artery without angina pectoris: Secondary | ICD-10-CM

## 2018-04-26 DIAGNOSIS — R251 Tremor, unspecified: Secondary | ICD-10-CM | POA: Diagnosis not present

## 2018-04-26 DIAGNOSIS — I159 Secondary hypertension, unspecified: Secondary | ICD-10-CM | POA: Diagnosis not present

## 2018-04-26 DIAGNOSIS — I10 Essential (primary) hypertension: Secondary | ICD-10-CM | POA: Diagnosis not present

## 2018-04-26 DIAGNOSIS — E118 Type 2 diabetes mellitus with unspecified complications: Secondary | ICD-10-CM

## 2018-04-26 DIAGNOSIS — E785 Hyperlipidemia, unspecified: Secondary | ICD-10-CM | POA: Diagnosis not present

## 2018-04-26 LAB — LDL CHOLESTEROL, DIRECT: Direct LDL: 145 mg/dL

## 2018-04-26 LAB — COMPREHENSIVE METABOLIC PANEL
ALBUMIN: 3.7 g/dL (ref 3.5–5.2)
ALT: 14 U/L (ref 0–53)
AST: 15 U/L (ref 0–37)
Alkaline Phosphatase: 111 U/L (ref 39–117)
BILIRUBIN TOTAL: 0.5 mg/dL (ref 0.2–1.2)
BUN: 8 mg/dL (ref 6–23)
CALCIUM: 9.2 mg/dL (ref 8.4–10.5)
CO2: 30 mEq/L (ref 19–32)
CREATININE: 0.73 mg/dL (ref 0.40–1.50)
Chloride: 101 mEq/L (ref 96–112)
GFR: 114.02 mL/min (ref >60.00)
Glucose, Bld: 245 mg/dL — ABNORMAL HIGH (ref 70–99)
Potassium: 4.3 mEq/L (ref 3.5–5.1)
Sodium: 139 mEq/L (ref 135–145)
Total Protein: 6.1 g/dL (ref 6.0–8.3)

## 2018-04-26 LAB — CBC
HEMATOCRIT: 41.1 % (ref 39.0–52.0)
HEMOGLOBIN: 14 g/dL (ref 13.0–17.0)
MCHC: 34 g/dL (ref 30.0–36.0)
MCV: 84.5 fl (ref 78.0–100.0)
PLATELETS: 214 10*3/uL (ref 150.0–400.0)
RBC: 4.86 Mil/uL (ref 4.22–5.81)
RDW: 14.7 % (ref 11.5–15.5)
WBC: 6.1 10*3/uL (ref 4.0–10.5)

## 2018-04-26 LAB — TSH: TSH: 0.92 u[IU]/mL (ref 0.35–4.50)

## 2018-04-26 LAB — LIPID PANEL
CHOLESTEROL: 216 mg/dL — AB (ref 0–200)
HDL: 33.8 mg/dL — AB (ref >39.00)
NonHDL: 182.05
Total CHOL/HDL Ratio: 6
Triglycerides: 294 mg/dL — ABNORMAL HIGH (ref 0.0–149.0)
VLDL: 58.8 mg/dL — ABNORMAL HIGH (ref 0.0–40.0)

## 2018-04-26 LAB — MAGNESIUM: MAGNESIUM: 1.6 mg/dL (ref 1.5–2.5)

## 2018-04-26 MED ORDER — GLUCOSE BLOOD VI STRP: ORAL_STRIP | 12 refills | Status: DC

## 2018-04-26 MED ORDER — FREESTYLE SYSTEM KIT: 1.0000 | PACK | 0 refills | Status: DC | PRN

## 2018-04-26 NOTE — Progress Notes (Signed)
Subjective:    Patient ID: Randy Foots., male    DOB: 1950/10/19, 67 y.o.   MRN: [id number]  DOS:  04/26/2018 Type of visit - description : f/u Interval history: Since the last office visit, he underwent bariatric surgery. He is losing a significant amount of weight and in general feels well. Was seen by pulmonology for CPAP.  Note reviewed. DM: Needs a new glucometer  Review of Systems Denies chest pain or difficulty breathing No nausea, vomiting, diarrhea  Past Medical History:  Diagnosis Date  . Arthritis   . CAD (coronary artery disease)    a. NSTEMI 12/12: EF 40-45%. JAS:NKNLZJQ balloon PTCA + Promus DES x 1 to mid LAD;  b. 11/2012: Cath with Cutting balloon PTCA  LAD for ISR to LAD, EF 55%;  c. 12/2012 Cath/PCI: LAD stents patent, 80 ost Diag (jailed)->PTCA, LCX/RCA patent.  . Essential hypertension   . Hyperlipidemia   . Ischemic cardiomyopathy    a.  echo 08/06/11: dist ant wall, apical and septal and infero-apical HK, mild LVH, EF 40%, mild LAE, PASP 34, asc aorta mildly dilated, mild TR;  b.  Echo (3/13) showed recovery of LV systolic function with EF 73-41%, grade I diastolic dysfunction, mild MR.   . Type 2 diabetes mellitus ([place])     Past Surgical History:  Procedure Laterality Date  . ANTERIOR CERVICAL DECOMP/DISCECTOMY FUSION  in the 90s  . CORONARY ANGIOPLASTY  12/15/2012; 12/22/2012  . CORONARY ANGIOPLASTY WITH STENT PLACEMENT  07/2011   "1" (12/22/2012)  . KNEE ARTHROSCOPY Left 12/31/2016  . LEFT HEART CATHETERIZATION WITH CORONARY ANGIOGRAM N/A 08/06/2011   Procedure: LEFT HEART CATHETERIZATION WITH CORONARY ANGIOGRAM;  Surgeon: Burnell Blanks, MD;  Location: [facility] CATH LAB;  Service: Cardiovascular;  Laterality: N/A;  . LEFT HEART CATHETERIZATION WITH CORONARY ANGIOGRAM N/A 12/15/2012   Procedure: LEFT HEART CATHETERIZATION WITH CORONARY ANGIOGRAM;  Surgeon: Sherren Mocha, MD;  Location: [facility] CATH LAB;  Service: Cardiovascular;  Laterality: N/A;  . LEFT  HEART CATHETERIZATION WITH CORONARY ANGIOGRAM N/A 12/22/2012   Procedure: LEFT HEART CATHETERIZATION WITH CORONARY ANGIOGRAM;  Surgeon: Sherren Mocha, MD;  Location: [facility] CATH LAB;  Service: Cardiovascular;  Laterality: N/A;  . PERCUTANEOUS CORONARY STENT INTERVENTION (PCI-S) Right 12/15/2012   Procedure: PERCUTANEOUS CORONARY STENT INTERVENTION (PCI-S);  Surgeon: Sherren Mocha, MD;  Location: [facility] CATH LAB;  Service: Cardiovascular;  Laterality: Right;    Social History   Socioeconomic History  . Marital status: Married    Spouse name: Not on file  . Number of children: 2   . Years of education: Not on file  . Highest education level: Not on file  Occupational History  . Occupation: Audiological scientist  Social Needs  . Financial resource strain: Not on file  . Food insecurity:    Worry: Not on file    Inability: Not on file  . Transportation needs:    Medical: Not on file    Non-medical: Not on file  Tobacco Use  . Smoking status: Former Smoker    Packs/day: 1.00    Years: 25.00    Pack years: 25.00    Types: Cigarettes    Last attempt to quit: 04/12/1992    Years since quitting: 26.0  . Smokeless tobacco: Never Used  Substance and Sexual Activity  . Alcohol use: Yes    Comment: 12/22/2012 "rarely maybe every 3-4 months, beer"  . Drug use: No  . Sexual activity: Yes  Lifestyle  . Physical activity:  Days per week: Not on file    Minutes per session: Not on file  . Stress: Not on file  Relationships  . Social connections:    Talks on phone: Not on file    Gets together: Not on file    Attends religious service: Not on file    Active member of club or organization: Not on file    Attends meetings of clubs or organizations: Not on file    Relationship status: Not on file  . Intimate partner violence:    Fear of current or ex partner: Not on file    Emotionally abused: Not on file    Physically abused: Not on file    Forced sexual activity: Not on file  Other  Topics Concern  . Not on file  Social History Narrative   Moved back to [place] from [place]-- pt and wife      Allergies as of 04/26/2018      Reactions   Trulicity [dulaglutide] Nausea And Vomiting      Medication List        Accurate as of 04/26/18 10:11 PM. Always use your most recent med list.          azelastine 0.1 % nasal spray Commonly known as:  ASTELIN Place 1-2 sprays into both nostrils 2 (two) times daily. Use in each nostril as directed   citalopram 20 MG tablet Commonly known as:  CELEXA Take 1 tablet (20 mg total) by mouth daily.   clopidogrel 75 MG tablet Commonly known as:  PLAVIX TAKE 1 TABLET BY MOUTH EVERY DAY   clotrimazole-betamethasone cream Commonly known as:  LOTRISONE Apply topically 2 (two) times daily.   fluticasone 50 MCG/ACT nasal spray Commonly known as:  FLONASE Place 2 sprays into both nostrils daily.   glucose blood test strip Use as instructed   glucose monitoring kit monitoring kit 1 each by Does not apply route as needed for other (Check sugar levels 3 times a day).   insulin regular 100 units/mL injection Commonly known as:  NOVOLIN R,HUMULIN R Inject into the skin 2 (two) times daily before a meal. 50 units AM, 30 units PM   magnesium oxide 400 (241.3 Mg) MG tablet Commonly known as:  MAG-OX Take 1 tablet (400 mg total) by mouth 2 (two) times daily.   nitroGLYCERIN 0.4 MG SL tablet Commonly known as:  NITROSTAT Place 1 tablet (0.4 mg total) under the tongue every 5 (five) minutes as needed for chest pain.   omeprazole 20 MG capsule Commonly known as:  PRILOSEC Take 1 tablet by mouth twice a day.   Potassium 99 MG Tabs Take 1 tablet by mouth daily.          Objective:   Physical Exam BP 108/60 (BP Location: Right Arm, Patient Position: Sitting, Cuff Size: Large)   Pulse (!) 41   Temp 97.9 F (36.6 C) (Oral)   Resp 16   Ht _0  (1.88 m)   Wt 245 lb (111.1 kg)   SpO2 97%   BMI 31.46 kg/m    General:   Well developed, NAD, see BMI.  HEENT:  Normocephalic . Face symmetric, atraumatic Lungs:  CTA B Normal respiratory effort, no intercostal retractions, no accessory muscle use. Heart: RRR,  no murmur.  No pretibial edema bilaterally  Skin: Not pale. Not jaundice Neurologic:  alert & oriented X3.  Speech normal, gait appropriate for age and unassisted Psych--  Cognition and judgment appear  intact.  Cooperative with normal attention span and concentration.  Behavior appropriate. No anxious or depressed appearing.    Subjective:    Patient ID: Randy Foots., male    DOB: [birth year], 67 y.o.   MRN: [id number]  DOS:  04/26/2018 Type of visit - description :  TCM 14 Interval history: Went to the ER with a skin bleeding,  The evaluation included labs and a  EKG. EKG was somewhat different to previous one, troponin was negative. A chest x-ray showing some vascular congestion. Eventually was admitted to hospital due to frequent PVCs, low magnesium and potassium. Here for follow-up  Review of Systems Since he left the hospital he is doing well.  Area is not further bleeding. He was seen with frequent PVCs but he was and remains asymptomatic. Specifically denies chest pain, palpitations.  Occasionally gets short of breath which is not unusual No nausea, vomiting, diarrhea no lower extremity edema. Good medication compliance.   Past Medical History:  Diagnosis Date  . Arthritis   . CAD (coronary artery disease)    a. NSTEMI 12/12: EF 40-45%. RZN:BVAPOLI balloon PTCA + Promus DES x 1 to mid LAD;  b. 11/2012: Cath with Cutting balloon PTCA  LAD for ISR to LAD, EF 55%;  c. 12/2012 Cath/PCI: LAD stents patent, 80 ost Diag (jailed)->PTCA, LCX/RCA patent.  . Essential hypertension   . Hyperlipidemia   . Ischemic cardiomyopathy    a.  echo 08/06/11: dist ant wall, apical and septal and infero-apical HK, mild LVH, EF 40%, mild LAE, PASP 34, asc aorta mildly dilated, mild TR;   b.  Echo (3/13) showed recovery of LV systolic function with EF 10-30%, grade I diastolic dysfunction, mild MR.   . Type 2 diabetes mellitus ([place])     Past Surgical History:  Procedure Laterality Date  . ANTERIOR CERVICAL DECOMP/DISCECTOMY FUSION  in the 90s  . CORONARY ANGIOPLASTY  12/15/2012; 12/22/2012  . CORONARY ANGIOPLASTY WITH STENT PLACEMENT  07/2011   "1" (12/22/2012)  . KNEE ARTHROSCOPY Left 12/31/2016  . LEFT HEART CATHETERIZATION WITH CORONARY ANGIOGRAM N/A 08/06/2011   Procedure: LEFT HEART CATHETERIZATION WITH CORONARY ANGIOGRAM;  Surgeon: Burnell Blanks, MD;  Location: [facility] CATH LAB;  Service: Cardiovascular;  Laterality: N/A;  . LEFT HEART CATHETERIZATION WITH CORONARY ANGIOGRAM N/A 12/15/2012   Procedure: LEFT HEART CATHETERIZATION WITH CORONARY ANGIOGRAM;  Surgeon: Sherren Mocha, MD;  Location: [facility] CATH LAB;  Service: Cardiovascular;  Laterality: N/A;  . LEFT HEART CATHETERIZATION WITH CORONARY ANGIOGRAM N/A 12/22/2012   Procedure: LEFT HEART CATHETERIZATION WITH CORONARY ANGIOGRAM;  Surgeon: Sherren Mocha, MD;  Location: [facility] CATH LAB;  Service: Cardiovascular;  Laterality: N/A;  . PERCUTANEOUS CORONARY STENT INTERVENTION (PCI-S) Right 12/15/2012   Procedure: PERCUTANEOUS CORONARY STENT INTERVENTION (PCI-S);  Surgeon: Sherren Mocha, MD;  Location: [facility] CATH LAB;  Service: Cardiovascular;  Laterality: Right;    Social History   Socioeconomic History  . Marital status: Married    Spouse name: Not on file  . Number of children: 2   . Years of education: Not on file  . Highest education level: Not on file  Occupational History  . Occupation: Audiological scientist  Social Needs  . Financial resource strain: Not on file  . Food insecurity:    Worry: Not on file    Inability: Not on file  . Transportation needs:    Medical: Not on file    Non-medical: Not on file  Tobacco Use  . Smoking status: Former Smoker  Packs/day: 1.00    Years: 25.00    Pack years: 25.00     Types: Cigarettes    Last attempt to quit: 04/12/1992    Years since quitting: 26.0  . Smokeless tobacco: Never Used  Substance and Sexual Activity  . Alcohol use: Yes    Comment: 12/22/2012 "rarely maybe every 3-4 months, beer"  . Drug use: No  . Sexual activity: Yes  Lifestyle  . Physical activity:    Days per week: Not on file    Minutes per session: Not on file  . Stress: Not on file  Relationships  . Social connections:    Talks on phone: Not on file    Gets together: Not on file    Attends religious service: Not on file    Active member of club or organization: Not on file    Attends meetings of clubs or organizations: Not on file    Relationship status: Not on file  . Intimate partner violence:    Fear of current or ex partner: Not on file    Emotionally abused: Not on file    Physically abused: Not on file    Forced sexual activity: Not on file  Other Topics Concern  . Not on file  Social History Narrative   Moved back to [place] from [place]-- pt and wife      Allergies as of 04/26/2018      Reactions   Trulicity [dulaglutide] Nausea And Vomiting      Medication List        Accurate as of 04/26/18 10:11 PM. Always use your most recent med list.          azelastine 0.1 % nasal spray Commonly known as:  ASTELIN Place 1-2 sprays into both nostrils 2 (two) times daily. Use in each nostril as directed   citalopram 20 MG tablet Commonly known as:  CELEXA Take 1 tablet (20 mg total) by mouth daily.   clopidogrel 75 MG tablet Commonly known as:  PLAVIX TAKE 1 TABLET BY MOUTH EVERY DAY   clotrimazole-betamethasone cream Commonly known as:  LOTRISONE Apply topically 2 (two) times daily.   fluticasone 50 MCG/ACT nasal spray Commonly known as:  FLONASE Place 2 sprays into both nostrils daily.   glucose blood test strip Use as instructed   glucose monitoring kit monitoring kit 1 each by Does not apply route as needed for other (Check sugar  levels 3 times a day).   insulin regular 100 units/mL injection Commonly known as:  NOVOLIN R,HUMULIN R Inject into the skin 2 (two) times daily before a meal. 50 units AM, 30 units PM   magnesium oxide 400 (241.3 Mg) MG tablet Commonly known as:  MAG-OX Take 1 tablet (400 mg total) by mouth 2 (two) times daily.   nitroGLYCERIN 0.4 MG SL tablet Commonly known as:  NITROSTAT Place 1 tablet (0.4 mg total) under the tongue every 5 (five) minutes as needed for chest pain.   omeprazole 20 MG capsule Commonly known as:  PRILOSEC Take 1 tablet by mouth twice a day.   Potassium 99 MG Tabs Take 1 tablet by mouth daily.          Objective:      BP 108/60 (BP Location: Right Arm, Patient Position: Sitting, Cuff Size: Large)   Pulse (!) 41   Temp 97.9 F (36.6 C) (Oral)   Resp 16   Ht _0  (1.88 m)   Wt 245 lb (  111.1 kg)   SpO2 97%   BMI 31.46 kg/m  General:   Well developed, morbidly obese appearing. NAD.  HEENT:  Normocephalic . Face symmetric, atraumatic Lungs:  CTA B Normal respiratory effort, no intercostal retractions, no accessory muscle use. Heart: RRR,  no murmur.  No pretibial edema bilaterally  Skin: Not pale. Not jaundice Neurologic:  alert & oriented X3.  Speech normal, gait appropriate for age and unassisted Psych--  Cognition and judgment appear intact.  Cooperative with normal attention span and concentration.  Behavior appropriate. No anxious or depressed appearing.      Assessment & Plan:      Assessment   DM -- insulin dependent, uncontrolled, with complications, CAD CHF.  Endo: [place] HTN Hyperlipidemia CAD, CHF, ischemic cardiomyopathy, PVCs (Holter 2019 show 13.6% burden) NSTEMI in 12/12, unstable angina in 4/14 and 5/14. He had DES to mid LAD in 12/12. EF was 40% with apical hypokinesis at the time of the MI. Repeat echo in 3/13 showed EF 55-60%. In 4/14, he was admitted with unstable angina and had 90% pLAD in-stent  restenosis. This was treated with cutting balloon angioplasty. Readmitted, again with unstable angina, in 5/14. This time he had PTCA to 90% ostial D1 stenosis.  OSA dx ~08-2017, om Cpap Anxiety-depression Skin cancer: Right leg, status post excision 2016; Dr Allyson Sabal Morbid obesity:gastric sleeve, 11-2017  PLAN: Since he had bariatric surgery he is not taking: Aspirin, atorvastatin, fenofibrate, metformin, carvedilol and Spironolactone Morbid obesity: Status post bariatric surgery, gastric sleeve, 11-2017, excellent weight loss.  Check a CBC DM: Off metformin, on insulin.  Recommend to see endocrinology.  Glucometer prescribed HTN: Not taking carvedilol or Spironolactone anymore.  On no medications for BP, BP today is excellent, pulse only 41, pulse is somewhat irregular but he has a h/o high burden PVC.  No change High cholesterol: Wife reports that atorvastatin and fenofibrate were stopped by surgery.  We are checking a FLP, likely will still benefit from atorvastatin. CAD: pt is asx Was told to stop aspirin as to her after surgery, will discuss with cardiology.  Currently on Plavix.   RTC  3 months

## 2018-04-26 NOTE — Patient Instructions (Addendum)
GO TO THE LAB : Get the blood work     GO TO THE FRONT DESK Schedule your next appointment for a checkup in 6 weeks   Please see your endocrinologist   For allergies: Restart Astelin and Flonase   Check the  blood pressure daily.  Also your pulse. Be sure your blood pressure is between 110/65 and  135/85. If it is consistently higher or lower, let me know

## 2018-04-26 NOTE — Assessment & Plan Note (Signed)
Since he had bariatric surgery he is not taking: Aspirin, atorvastatin, fenofibrate, metformin, carvedilol and Spironolactone Morbid obesity: Status post bariatric surgery, gastric sleeve, 11-2017, excellent weight loss.  Check a CBC DM: Off metformin, on insulin.  Recommend to see endocrinology.  Glucometer prescribed HTN: Not taking carvedilol or Spironolactone anymore.  On no medications for BP, BP today is excellent, pulse only 41, pulse is somewhat irregular but he has a h/o high burden PVC.  No change High cholesterol: Wife reports that atorvastatin and fenofibrate were stopped by surgery.  We are checking a FLP, likely will still benefit from atorvastatin. CAD: pt is asx Was told to stop aspirin as to her after surgery, will discuss with cardiology.  Currently on Plavix.   RTC  3 months

## 2018-04-27 ENCOUNTER — Telehealth: Payer: Self-pay | Admitting: Internal Medicine

## 2018-04-27 MED ORDER — GLUCOSE BLOOD VI STRP: ORAL_STRIP | 12 refills | Status: DC

## 2018-04-27 NOTE — Telephone Encounter (Signed)
Copied from [place] [id number]. Topic: Quick Communication - See Telephone Encounter >> Apr 27, 2018  3:12 PM Hewitt Shorts wrote: [facility] in [place] is calling stating that the test strips that were called in needs to have a amount of testing to be covered under insurance   Best number  [phone]

## 2018-04-27 NOTE — Progress Notes (Addendum)
Subjective:   Randy Davee. is a 67 y.o. male who presents for Medicare Annual/Subsequent preventive examination.  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  Review of Systems: No ROS.  Medicare Wellness Visit. Additional risk factors are reflected in the social history. Cardiac Risk Factors include: advanced age (>18mn, >>40women);diabetes mellitus;dyslipidemia;hypertension;male gender Sleep patterns: no issues Home Safety/Smoke Alarms: Feels safe in home. Smoke alarms in place. Lives in 1st floor apt with wife and dog and cat.  [facility]- [facility] yearly. UTD per pt.  Male:   CCS-    cologuard 01/25/17-negative PSA-  Lab Results  Component Value Date   PSA 0.35 12/05/2014   PSA 0.44 05/31/2013   PSA 0.66 10/28/2011       Objective:    Vitals: BP (!) 142/88 (BP Location: Left Arm, Patient Position: Sitting, Cuff Size: Normal)   Pulse 65   Ht 6' 2" (1.88 m)   Wt 246 lb 12.8 oz (111.9 kg)   SpO2 98%   BMI 31.69 kg/m   Body mass index is 31.69 kg/m.  Advanced Directives 04/28/2018 11/14/2017 11/14/2017 11/14/2017 04/27/2017 02/18/2017 02/18/2017  Does Patient Have a Medical Advance Directive? _0  No No  Would patient like information on creating a medical advance directive? Yes (MAU/Ambulatory/Procedural Areas - Information given) No - Patient declined No - Patient declined - No - Patient declined Yes (MAU/Ambulatory/Procedural Areas - Information given) Yes (MAU/Ambulatory/Procedural Areas - Information given)  Pre-existing out of facility DNR order (yellow form or pink MOST form) - - - - - - -    Tobacco Social History   Tobacco Use  Smoking Status Former Smoker  . Packs/day: 1.00  . Years: 25.00  . Pack years: 25.00  . Types: Cigarettes  . Last attempt to quit: 04/12/1992  . Years since quitting: 26.0  Smokeless Tobacco Never Used     Counseling given: Not Answered   Clinical Intake: Pain : No/denies pain    Past Medical  History:  Diagnosis Date  . Arthritis   . CAD (coronary artery disease)    a. NSTEMI 12/12: EF 40-45%. PUUE:KCMKLKJballoon PTCA + Promus DES x 1 to mid LAD;  b. 11/2012: Cath with Cutting balloon PTCA  LAD for ISR to LAD, EF 55%;  c. 12/2012 Cath/PCI: LAD stents patent, 80 ost Diag (jailed)->PTCA, LCX/RCA patent.  . Essential hypertension   . Hyperlipidemia   . Ischemic cardiomyopathy    a.  echo 08/06/11: dist ant wall, apical and septal and infero-apical HK, mild LVH, EF 40%, mild LAE, PASP 34, asc aorta mildly dilated, mild TR;  b.  Echo (3/13) showed recovery of LV systolic function with EF 517-91% grade I diastolic dysfunction, mild MR.   . Type 2 diabetes mellitus ([facility]    Past Surgical History:  Procedure Laterality Date  . ANTERIOR CERVICAL DECOMP/DISCECTOMY FUSION  in the 90s  . CORONARY ANGIOPLASTY  12/15/2012; 12/22/2012  . CORONARY ANGIOPLASTY WITH STENT PLACEMENT  07/2011   "1" (12/22/2012)  . GASTRIC BYPASS  12/21/2017  . KNEE ARTHROSCOPY Left 12/31/2016  . LEFT HEART CATHETERIZATION WITH CORONARY ANGIOGRAM N/A 08/06/2011   Procedure: LEFT HEART CATHETERIZATION WITH CORONARY ANGIOGRAM;  Surgeon: CBurnell Blanks MD;  [facility];  Service: Cardiovascular;  Laterality: N/A;  . LEFT HEART CATHETERIZATION WITH CORONARY ANGIOGRAM N/A 12/15/2012   Procedure: LEFT HEART CATHETERIZATION WITH CORONARY ANGIOGRAM;  Surgeon: MSherren Mocha MD;  [facility];  Service: Cardiovascular;  Laterality: N/A;  . LEFT HEART CATHETERIZATION WITH CORONARY ANGIOGRAM N/A 12/22/2012   Procedure: LEFT HEART CATHETERIZATION WITH CORONARY ANGIOGRAM;  Surgeon: Sherren Mocha, MD;  Location: [facility] CATH LAB;  Service: Cardiovascular;  Laterality: N/A;  . PERCUTANEOUS CORONARY STENT INTERVENTION (PCI-S) Right 12/15/2012   Procedure: PERCUTANEOUS CORONARY STENT INTERVENTION (PCI-S);  Surgeon: Sherren Mocha, MD;  Location: [facility] CATH LAB;  Service: Cardiovascular;  Laterality: Right;   Family  History  Problem Relation Age of Onset  . Diabetes Brother   . Lung cancer Brother   . Diabetes Mother   . Coronary artery disease Father   . Heart attack Father 78  . Diabetes Sister   . Colon cancer Neg Hx   . Prostate cancer Neg Hx   . Stroke Neg Hx    Social History   Socioeconomic History  . Marital status: Married    Spouse name: Not on file  . Number of children: 2   . Years of education: Not on file  . Highest education level: Not on file  Occupational History  . Occupation: Audiological scientist  Social Needs  . Financial resource strain: Not on file  . Food insecurity:    Worry: Not on file    Inability: Not on file  . Transportation needs:    Medical: Not on file    Non-medical: Not on file  Tobacco Use  . Smoking status: Former Smoker    Packs/day: 1.00    Years: 25.00    Pack years: 25.00    Types: Cigarettes    Last attempt to quit: 04/12/1992    Years since quitting: 26.0  . Smokeless tobacco: Never Used  Substance and Sexual Activity  . Alcohol use: Yes    Comment: 12/22/2012 "rarely maybe every 3-4 months, beer"  . Drug use: No  . Sexual activity: Yes  Lifestyle  . Physical activity:    Days per week: Not on file    Minutes per session: Not on file  . Stress: Not on file  Relationships  . Social connections:    Talks on phone: Not on file    Gets together: Not on file    Attends religious service: Not on file    Active member of club or organization: Not on file    Attends meetings of clubs or organizations: Not on file    Relationship status: Not on file  Other Topics Concern  . Not on file  Social History Narrative   Moved back to [place] from [place]-- pt and wife    Outpatient Encounter Medications as of 04/28/2018  Medication Sig  . atorvastatin (LIPITOR) 80 MG tablet Take 1 tablet (80 mg total) by mouth at bedtime.  Marland Kitchen azelastine (ASTELIN) 0.1 % nasal spray Place 1-2 sprays into both nostrils 2 (two) times daily. Use in  each nostril as directed  . citalopram (CELEXA) 20 MG tablet Take 1 tablet (20 mg total) by mouth daily.  . clopidogrel (PLAVIX) 75 MG tablet TAKE 1 TABLET BY MOUTH EVERY DAY  . clotrimazole-betamethasone (LOTRISONE) cream Apply topically 2 (two) times daily. (Patient taking differently: Apply topically 2 (two) times daily as needed. )  . fluticasone (FLONASE) 50 MCG/ACT nasal spray Place 2 sprays into both nostrils daily.  Marland Kitchen glucose blood test strip Check blood sugar 2-3 times daily  . glucose monitoring kit (FREESTYLE) monitoring kit 1 each by Does not apply route as needed for other (Check sugar levels 3  times a day).  . insulin regular (NOVOLIN R,HUMULIN R) 100 units/mL injection Inject into the skin 2 (two) times daily before a meal. 50 units AM, 30 units PM  . magnesium oxide (MAG-OX) 400 (241.3 Mg) MG tablet Take 1 tablet (400 mg total) by mouth 2 (two) times daily.  . nitroGLYCERIN (NITROSTAT) 0.4 MG SL tablet Place 1 tablet (0.4 mg total) under the tongue every 5 (five) minutes as needed for chest pain.  Marland Kitchen omeprazole (PRILOSEC) 20 MG capsule Take 1 tablet by mouth twice a day.  . Potassium 99 MG TABS Take 1 tablet by mouth daily.  . ursodiol (ACTIGALL) 300 MG capsule Take 300 mg by mouth 2 (two) times daily.  . [DISCONTINUED] glucose blood test strip Use as instructed   No facility-administered encounter medications on file as of 04/28/2018.     Activities of Daily Living In your present state of health, do you have any difficulty performing the following activities: 04/28/2018 11/14/2017  Hearing? N Y  Vision? N N  Difficulty concentrating or making decisions? N N  Walking or climbing stairs? N Y  Comment - Pt has had L knee operated on-trouble with walking up and down stairs  Dressing or bathing? N N  Doing errands, shopping? N N  Preparing Food and eating ? N -  Using the Toilet? N -  In the past six months, have you accidently leaked urine? N -  Do you have problems with loss of  bowel control? N -  Managing your Medications? N -  Managing your Finances? N -  Housekeeping or managing your Housekeeping? N -  Some recent data might be hidden    Patient Care Team: Colon Branch, MD as PCP - General (Internal Medicine) Satira Sark, MD as PCP - Cardiology (Cardiology) Larey Dresser, MD as Consulting Physician (Cardiology) Lake Bells., MD as Referring Physician (Gastroenterology) Gearlean Alf, PA-C as Physician Assistant (Pulmonary Disease) Demetrius Revel, MD as Consulting Physician (Bariatrics) Bernie Covey, Javier Glazier, NP as Nurse Practitioner (Endocrinology)   Assessment:   This is a routine wellness examination for Randy Castro. Physical assessment deferred to PCP.  Exercise Activities and Dietary recommendations Current Exercise Habits: Home exercise routine, Type of exercise: walking, Time (Minutes): 25, Frequency (Times/Week): 3, Weekly Exercise (Minutes/Week): 75, Intensity: Mild, Exercise limited by: None identified Diet (meal preparation, eat out, water intake, caffeinated beverages, dairy products, fruits and vegetables):  Breakfast:egg,sausage or bacon, oatmeal or protein drink Lunch: ramen noodles Dinner:  Chips and salsa, Tax inspector    . Weight (lb) < 275 lb (124.7 kg)     296lb today.  Will eating healthier and getting more exercise.       Fall Risk Fall Risk  04/28/2018 05/18/2017 04/27/2017 03/02/2017 02/18/2017  Falls in the past year? _0     Depression Screen PHQ 2/9 Scores 04/28/2018 04/27/2017 02/18/2017 02/15/2017  PHQ - 2 Score 0 0 0 0  Exception Documentation - - - -    Cognitive Function Ad8 score reviewed for issues:  Issues making decisions:no  Less interest in hobbies / activities:no  Repeats questions, stories (family complaining):no  Trouble using ordinary gadgets (microwave, computer, phone):no  Forgets the month or year: no  Mismanaging finances: no  Remembering appts:no  Daily problems  with thinking and/or memory:no Ad8 score is=0  MMSE - Mini Mental State Exam 04/27/2017  Orientation to time 5  Orientation to Place 5  Registration 3  Attention/ Calculation 5  Recall 3  Language- name 2 objects 2  Language- repeat 1  Language- follow 3 step command 3  Language- read & follow direction 1  Write a sentence 1  Copy design 1  Total score 30        Immunization History  Administered Date(s) Administered  . Influenza Split 06/30/2011  . Influenza, High Dose Seasonal PF 05/31/2013, 05/27/2017  . Influenza, Seasonal, Injecte, Preservative Fre 07/28/2012  . Influenza,inj,Quad PF,6+ Mos 05/29/2014, 06/07/2015, 04/23/2016  . Pneumococcal Conjugate-13 12/05/2014  . Pneumococcal Polysaccharide-23 08/07/2011  . Tdap 05/31/2013  . Zoster 11/28/2012    Screening Tests Health Maintenance  Topic Date Due  . FOOT EXAM  01/13/2018  . INFLUENZA VACCINE  03/24/2018  . OPHTHALMOLOGY EXAM  03/29/2018  . HEMOGLOBIN A1C  04/28/2018  . PNA vac Low Risk Adult (2 of 2 - PPSV23) 09/02/2018 (Originally 08/25/2016)  . Fecal DNA (Cologuard)  01/26/2020  . TETANUS/TDAP  06/01/2023  . Hepatitis C Screening  Completed       Plan:    Please schedule your next medicare wellness visit with me in 1 yr.  Eat heart healthy diet (full of fruits, vegetables, whole grains, lean protein, water--limit salt, fat, and sugar intake) and increase physical activity as tolerated.  Continue doing brain stimulating activities (puzzles, reading, adult coloring books, staying active) to keep memory sharp.   Bring a copy of your living will and/or healthcare power of attorney to your next office visit.   I have personally reviewed and noted the following in the patient's chart:   . Medical and social history . Use of alcohol, tobacco or illicit drugs  . Current medications and supplements . Functional ability and status . Nutritional status . Physical activity . Advanced directives . List of  other physicians . Hospitalizations, surgeries, and ER visits in previous 12 months . Vitals . Screenings to include cognitive, depression, and falls . Referrals and appointments  In addition, I have reviewed and discussed with patient certain preventive protocols, quality metrics, and best practice recommendations. A written personalized care plan for preventive services as well as general preventive health recommendations were provided to patient.     Naaman Plummer [place], [place]  04/28/2018  Kathlene November, MD

## 2018-04-27 NOTE — Telephone Encounter (Signed)
See telephone encounter. LOV was yesterday 04/26/18. PCP Dr. Larose Kells

## 2018-04-27 NOTE — Telephone Encounter (Signed)
Rx updated and re-sent.

## 2018-04-28 ENCOUNTER — Other Ambulatory Visit: Payer: Self-pay

## 2018-04-28 ENCOUNTER — Ambulatory Visit (INDEPENDENT_AMBULATORY_CARE_PROVIDER_SITE_OTHER): Payer: PPO | Admitting: *Deleted

## 2018-04-28 ENCOUNTER — Encounter: Payer: Self-pay | Admitting: *Deleted

## 2018-04-28 VITALS — BP 142/88 | HR 65 | Ht 74.0 in | Wt 246.8 lb

## 2018-04-28 DIAGNOSIS — Z0000 Encounter for general adult medical examination without abnormal findings: Secondary | ICD-10-CM

## 2018-04-28 MED ORDER — ATORVASTATIN CALCIUM 80 MG PO TABS: 80.0000 mg | ORAL_TABLET | Freq: Every day | ORAL | 0 refills | Status: DC

## 2018-04-28 NOTE — Patient Instructions (Signed)
Please schedule your next medicare wellness visit with me in 1 yr.  Eat heart healthy diet (full of fruits, vegetables, whole grains, lean protein, water--limit salt, fat, and sugar intake) and increase physical activity as tolerated.  Continue doing brain stimulating activities (puzzles, reading, adult coloring books, staying active) to keep memory sharp.   Bring a copy of your living will and/or healthcare power of attorney to your next office visit.   Randy Castro , Thank you for taking time to come for your Medicare Wellness Visit. I appreciate your ongoing commitment to your health goals. Please review the following plan we discussed and let me know if I can assist you in the future.   These are the goals we discussed: Goals    . Weight (lb) < 275 lb (124.7 kg)     296lb today.  Will eating healthier and getting more exercise.       This is a list of the screening recommended for you and due dates:  Health Maintenance  Topic Date Due  . Flu Shot  03/24/2018  . Eye exam for diabetics  03/29/2018  . Hemoglobin A1C  04/28/2018  . Pneumonia vaccines (2 of 2 - PPSV23) 09/02/2018*  . Complete foot exam   12/15/2018  . Cologuard (Stool DNA test)  01/26/2020  . Tetanus Vaccine  06/01/2023  .  Hepatitis C: One time screening is recommended by Center for Disease Control  (CDC) for  adults born from [birth year] through 1964.   Completed  *Topic was postponed. The date shown is not the original due date.    Health Maintenance, Male A healthy lifestyle and preventive care is important for your health and wellness. Ask your health care provider about what schedule of regular examinations is right for you. What should I know about weight and diet? Eat a Healthy Diet  Eat plenty of vegetables, fruits, whole grains, low-fat dairy products, and lean protein.  Do not eat a lot of foods high in solid fats, added sugars, or salt.  Maintain a Healthy Weight Regular exercise can help you achieve or  maintain a healthy weight. You should:  Do at least 150 minutes of exercise each week. The exercise should increase your heart rate and make you sweat (moderate-intensity exercise).  Do strength-training exercises at least twice a week.  Watch Your Levels of Cholesterol and Blood Lipids  Have your blood tested for lipids and cholesterol every 5 years starting at 66 years of age. If you are at high risk for heart disease, you should start having your blood tested when you are 66 years old. You may need to have your cholesterol levels checked more often if: ? Your lipid or cholesterol levels are high. ? You are older than 66 years of age. ? You are at high risk for heart disease.  What should I know about cancer screening? Many types of cancers can be detected early and may often be prevented. Lung Cancer  You should be screened every year for lung cancer if: ? You are a current smoker who has smoked for at least 30 years. ? You are a former smoker who has quit within the past 15 years.  Talk to your health care provider about your screening options, when you should start screening, and how often you should be screened.  Colorectal Cancer  Routine colorectal cancer screening usually begins at 66 years of age and should be repeated every 5-10 years until you are 66 years old. You may  need to be screened more often if early forms of precancerous polyps or small growths are found. Your health care provider may recommend screening at an earlier age if you have risk factors for colon cancer.  Your health care provider may recommend using home test kits to check for hidden blood in the stool.  A small camera at the end of a tube can be used to examine your colon (sigmoidoscopy or colonoscopy). This checks for the earliest forms of colorectal cancer.  Prostate and Testicular Cancer  Depending on your age and overall health, your health care provider may do certain tests to screen for prostate  and testicular cancer.  Talk to your health care provider about any symptoms or concerns you have about testicular or prostate cancer.  Skin Cancer  Check your skin from head to toe regularly.  Tell your health care provider about any new moles or changes in moles, especially if: ? There is a change in a mole's size, shape, or color. ? You have a mole that is larger than a pencil eraser.  Always use sunscreen. Apply sunscreen liberally and repeat throughout the day.  Protect yourself by wearing long sleeves, pants, a wide-brimmed hat, and sunglasses when outside.  What should I know about heart disease, diabetes, and high blood pressure?  If you are 6-73 years of age, have your blood pressure checked every 3-5 years. If you are 63 years of age or older, have your blood pressure checked every year. You should have your blood pressure measured twice-once when you are at a hospital or clinic, and once when you are not at a hospital or clinic. Record the average of the two measurements. To check your blood pressure when you are not at a hospital or clinic, you can use: ? An automated blood pressure machine at a pharmacy. ? A home blood pressure monitor.  Talk to your health care provider about your target blood pressure.  If you are between 80-23 years old, ask your health care provider if you should take aspirin to prevent heart disease.  Have regular diabetes screenings by checking your fasting blood sugar level. ? If you are at a normal weight and have a low risk for diabetes, have this test once every three years after the age of 28. ? If you are overweight and have a high risk for diabetes, consider being tested at a younger age or more often.  A one-time screening for abdominal aortic aneurysm (AAA) by ultrasound is recommended for men aged 29-75 years who are current or former smokers. What should I know about preventing infection? Hepatitis B If you have a higher risk for  hepatitis B, you should be screened for this virus. Talk with your health care provider to find out if you are at risk for hepatitis B infection. Hepatitis C Blood testing is recommended for:  Everyone born from [birth year] through 1964.  Anyone with known risk factors for hepatitis C.  Sexually Transmitted Diseases (STDs)  You should be screened each year for STDs including gonorrhea and chlamydia if: ? You are sexually active and are younger than 66 years of age. ? You are older than 66 years of age and your health care provider tells you that you are at risk for this type of infection. ? Your sexual activity has changed since you were last screened and you are at an increased risk for chlamydia or gonorrhea. Ask your health care provider if you are at risk.  Talk  with your health care provider about whether you are at high risk of being infected with HIV. Your health care provider may recommend a prescription medicine to help prevent HIV infection.  What else can I do?  Schedule regular health, dental, and eye exams.  Stay current with your vaccines (immunizations).  Do not use any tobacco products, such as cigarettes, chewing tobacco, and e-cigarettes. If you need help quitting, ask your health care provider.  Limit alcohol intake to no more than 2 drinks per day. One drink equals 12 ounces of beer, 5 ounces of wine, or 1 ounces of hard liquor.  Do not use street drugs.  Do not share needles.  Ask your health care provider for help if you need support or information about quitting drugs.  Tell your health care provider if you often feel depressed.  Tell your health care provider if you have ever been abused or do not feel safe at home. This information is not intended to replace advice given to you by your health care provider. Make sure you discuss any questions you have with your health care provider. Document Released: 02/06/2008 Document Revised: 04/08/2016 Document Reviewed:  05/14/2015 Elsevier Interactive Patient Education  Sandip Schein.

## 2018-05-13 DIAGNOSIS — G4733 Obstructive sleep apnea (adult) (pediatric): Secondary | ICD-10-CM | POA: Diagnosis not present

## 2018-06-07 ENCOUNTER — Ambulatory Visit (INDEPENDENT_AMBULATORY_CARE_PROVIDER_SITE_OTHER): Payer: PPO | Admitting: Internal Medicine

## 2018-06-07 ENCOUNTER — Encounter: Payer: Self-pay | Admitting: Internal Medicine

## 2018-06-07 VITALS — BP 124/82 | HR 56 | Temp 98.1°F | Resp 16 | Ht 74.0 in | Wt 235.5 lb

## 2018-06-07 DIAGNOSIS — E7800 Pure hypercholesterolemia, unspecified: Secondary | ICD-10-CM

## 2018-06-07 DIAGNOSIS — E1165 Type 2 diabetes mellitus with hyperglycemia: Secondary | ICD-10-CM

## 2018-06-07 DIAGNOSIS — E1159 Type 2 diabetes mellitus with other circulatory complications: Secondary | ICD-10-CM

## 2018-06-07 DIAGNOSIS — E669 Obesity, unspecified: Secondary | ICD-10-CM

## 2018-06-07 DIAGNOSIS — Z23 Encounter for immunization: Secondary | ICD-10-CM | POA: Diagnosis not present

## 2018-06-07 DIAGNOSIS — R251 Tremor, unspecified: Secondary | ICD-10-CM

## 2018-06-07 LAB — LIPID PANEL
CHOL/HDL RATIO: 4
Cholesterol: 122 mg/dL (ref 0–200)
HDL: 34.1 mg/dL — AB (ref >39.00)
LDL CALC: 49 mg/dL (ref 0–99)
NONHDL: 87.54
Triglycerides: 191 mg/dL — ABNORMAL HIGH (ref 0.0–149.0)
VLDL: 38.2 mg/dL (ref 0.0–40.0)

## 2018-06-07 LAB — AST: AST: 12 U/L (ref 0–37)

## 2018-06-07 LAB — ALT: ALT: 16 U/L (ref 0–53)

## 2018-06-07 NOTE — Patient Instructions (Signed)
GO TO THE LAB : Get the blood work     GO TO THE FRONT DESK Schedule your next appointment for a  Check up in 4 months   

## 2018-06-07 NOTE — Progress Notes (Signed)
Subjective:    Patient ID: Randy Castro., male    DOB: 03-04-1951, 67 y.o.   MRN: [id number]  DOS:  06/07/2018 Type of visit - description : rov Interval history: Since the last office visit, he is taking the medication as prescribed, still on insulin. He reports a long history of tremors in the hands, getting progressively worse, affecting his ADLs like writing or drinking coffee.  Wt Readings from Last 3 Encounters:  06/07/18 235 lb 8 oz (106.8 kg)  04/28/18 246 lb 12.8 oz (111.9 kg)  04/26/18 245 lb (111.1 kg)     Review of Systems Denies chest pain no difficulty breathing.  Past Medical History:  Diagnosis Date  . Arthritis   . CAD (coronary artery disease)    a. NSTEMI 12/12: EF 40-45%. JSE:GBTDVVO balloon PTCA + Promus DES x 1 to mid LAD;  b. 11/2012: Cath with Cutting balloon PTCA  LAD for ISR to LAD, EF 55%;  c. 12/2012 Cath/PCI: LAD stents patent, 80 ost Diag (jailed)->PTCA, LCX/RCA patent.  . Essential hypertension   . Hyperlipidemia   . Ischemic cardiomyopathy    a.  echo 08/06/11: dist ant wall, apical and septal and infero-apical HK, mild LVH, EF 40%, mild LAE, PASP 34, asc aorta mildly dilated, mild TR;  b.  Echo (3/13) showed recovery of LV systolic function with EF 16-07%, grade I diastolic dysfunction, mild MR.   . Type 2 diabetes mellitus ([place])     Past Surgical History:  Procedure Laterality Date  . ANTERIOR CERVICAL DECOMP/DISCECTOMY FUSION  in the 90s  . CORONARY ANGIOPLASTY  12/15/2012; 12/22/2012  . CORONARY ANGIOPLASTY WITH STENT PLACEMENT  07/2011   "1" (12/22/2012)  . GASTRIC BYPASS  12/21/2017  . KNEE ARTHROSCOPY Left 12/31/2016  . LEFT HEART CATHETERIZATION WITH CORONARY ANGIOGRAM N/A 08/06/2011   Procedure: LEFT HEART CATHETERIZATION WITH CORONARY ANGIOGRAM;  Surgeon: Burnell Blanks, MD;  Location: [facility] CATH LAB;  Service: Cardiovascular;  Laterality: N/A;  . LEFT HEART CATHETERIZATION WITH CORONARY ANGIOGRAM N/A 12/15/2012   Procedure: LEFT  HEART CATHETERIZATION WITH CORONARY ANGIOGRAM;  Surgeon: Sherren Mocha, MD;  Location: [facility] CATH LAB;  Service: Cardiovascular;  Laterality: N/A;  . LEFT HEART CATHETERIZATION WITH CORONARY ANGIOGRAM N/A 12/22/2012   Procedure: LEFT HEART CATHETERIZATION WITH CORONARY ANGIOGRAM;  Surgeon: Sherren Mocha, MD;  Location: [facility] CATH LAB;  Service: Cardiovascular;  Laterality: N/A;  . PERCUTANEOUS CORONARY STENT INTERVENTION (PCI-S) Right 12/15/2012   Procedure: PERCUTANEOUS CORONARY STENT INTERVENTION (PCI-S);  Surgeon: Sherren Mocha, MD;  Location: [facility] CATH LAB;  Service: Cardiovascular;  Laterality: Right;    Social History   Socioeconomic History  . Marital status: Married    Spouse name: Not on file  . Number of children: 2   . Years of education: Not on file  . Highest education level: Not on file  Occupational History  . Occupation: Audiological scientist  Social Needs  . Financial resource strain: Not on file  . Food insecurity:    Worry: Not on file    Inability: Not on file  . Transportation needs:    Medical: Not on file    Non-medical: Not on file  Tobacco Use  . Smoking status: Former Smoker    Packs/day: 1.00    Years: 25.00    Pack years: 25.00    Types: Cigarettes    Last attempt to quit: 04/12/1992    Years since quitting: 26.1  . Smokeless tobacco: Never Used  Substance and Sexual Activity  .  Alcohol use: Yes    Comment: 12/22/2012 "rarely maybe every 3-4 months, beer"  . Drug use: No  . Sexual activity: Yes  Lifestyle  . Physical activity:    Days per week: Not on file    Minutes per session: Not on file  . Stress: Not on file  Relationships  . Social connections:    Talks on phone: Not on file    Gets together: Not on file    Attends religious service: Not on file    Active member of club or organization: Not on file    Attends meetings of clubs or organizations: Not on file    Relationship status: Not on file  . Intimate partner violence:    Fear of  current or ex partner: Not on file    Emotionally abused: Not on file    Physically abused: Not on file    Forced sexual activity: Not on file  Other Topics Concern  . Not on file  Social History Narrative   Moved back to [place] from [place]-- pt and wife      Allergies as of 06/07/2018      Reactions   Trulicity [dulaglutide] Nausea And Vomiting      Medication List        Accurate as of 06/07/18 12:59 PM. Always use your most recent med list.          aspirin EC 81 MG tablet Take 81 mg by mouth daily.   atorvastatin 80 MG tablet Commonly known as:  LIPITOR Take 1 tablet (80 mg total) by mouth at bedtime.   azelastine 0.1 % nasal spray Commonly known as:  ASTELIN Place 1-2 sprays into both nostrils 2 (two) times daily. Use in each nostril as directed   citalopram 20 MG tablet Commonly known as:  CELEXA Take 1 tablet (20 mg total) by mouth daily.   clopidogrel 75 MG tablet Commonly known as:  PLAVIX TAKE 1 TABLET BY MOUTH EVERY DAY   clotrimazole-betamethasone cream Commonly known as:  LOTRISONE Apply topically 2 (two) times daily.   fluticasone 50 MCG/ACT nasal spray Commonly known as:  FLONASE Place 2 sprays into both nostrils daily.   glucose blood test strip Check blood sugar 2-3 times daily   glucose monitoring kit monitoring kit 1 each by Does not apply route as needed for other (Check sugar levels 3 times a day).   insulin regular 100 units/mL injection Commonly known as:  NOVOLIN R,HUMULIN R Inject into the skin 2 (two) times daily before a meal. 50 units AM, 30 units PM   magnesium oxide 400 (241.3 Mg) MG tablet Commonly known as:  MAG-OX Take 1 tablet (400 mg total) by mouth 2 (two) times daily.   nitroGLYCERIN 0.4 MG SL tablet Commonly known as:  NITROSTAT Place 1 tablet (0.4 mg total) under the tongue every 5 (five) minutes as needed for chest pain.   omeprazole 20 MG capsule Commonly known as:  PRILOSEC Take 1 tablet by  mouth twice a day.   Potassium 99 MG Tabs Take 1 tablet by mouth daily.   ursodiol 300 MG capsule Commonly known as:  ACTIGALL Take 300 mg by mouth 2 (two) times daily.          Objective:   Physical Exam BP 124/82 (BP Location: Left Arm, Patient Position: Sitting, Cuff Size: Small)   Pulse (!) 56   Temp 98.1 F (36.7 C) (Oral)   Resp 16  Ht 6' 2"  (1.88 m)   Wt 235 lb 8 oz (106.8 kg)   SpO2 98%   BMI 30.24 kg/m  General:   Well developed, NAD, see BMI.  HEENT:  Normocephalic . Face symmetric, atraumatic Lungs:  CTA B Normal respiratory effort, no intercostal retractions, no accessory muscle use. Heart: RRR,  no murmur.  No pretibial edema bilaterally  Skin: Not pale. Not jaundice Neurologic:  alert & oriented X3.  Speech normal, gait appropriate for age and unassisted.  Mild to moderate hand tremors Psych--  Cognition and judgment appear intact.  Cooperative with normal attention span and concentration.  Behavior appropriate. No anxious or depressed appearing.      Assessment & Plan:   Assessment   DM -- insulin dependent, uncontrolled, with complications, CAD CHF.  Endo: [place] HTN Hyperlipidemia CAD, CHF, ischemic cardiomyopathy, PVCs (Holter 2019 show 13.6% burden) NSTEMI in 12/12, unstable angina in 4/14 and 5/14. He had DES to mid LAD in 12/12. EF was 40% with apical hypokinesis at the time of the MI. Repeat echo in 3/13 showed EF 55-60%. In 4/14, he was admitted with unstable angina and had 90% pLAD in-stent restenosis. This was treated with cutting balloon angioplasty. Readmitted, again with unstable angina, in 5/14. This time he had PTCA to 90% ostial D1 stenosis.  OSA dx ~08-2017, om Cpap Anxiety-depression Skin cancer: Right leg, status post excision 2016; Dr Allyson Sabal Morbid obesity:gastric sleeve, 11-2017  PLAN: DM: Since the last office visit, has not seen endocrinology, currently taking: R insulin 40, 50 units. N insulin:  50, 30 units. CBGs remain elevated in the 200 range.  He sees [facility] Endo, will send a referral . Morbid obesity: Has not seen the bariatric team recently, encouraged to follow with them regularly. HTN: On no medications, BP is very good. Hyperlipidemia: Based on last FLP, Lipitor restarted, check a FLP, AST, ALT. CAD: After last visit I  recommended to go back on aspirin.  Good compliance. Tremors: Started in 2011, gradually getting worse, affecting his ADLs.  Likely essential tremor, refer to Dr. Carles Collet. Flu shot today RTC 4 months.

## 2018-06-07 NOTE — Progress Notes (Signed)
Randy Castro was seen today in the movement disorders clinic for neurologic consultation at the request of Colon Branch, MD.  The consultation is for the evaluation of tremor.  This patient is accompanied in the office by his spouse who supplements the history.  Tremor: Yes.     How long has it been going on? since 2011, increased with time  At rest or with activation?  activation  Fam hx of tremor?  Yes.  , sister has tremor  Located where?  Bilateral UE  Affected by caffeine:  No. (couple cups of coffee/day)  Affected by alcohol:  No.  Affected by stress:  Yes.    Affected by fatigue:  Yes.   and especially if hot  Spills soup if on spoon:  May or may not  Spills glass of liquid if full:  No.  Affects ADL's (tying shoes, brushing teeth, etc):  No.  Tremor inducing meds:  No.    ALLERGIES:   Allergies  Allergen Reactions  . Trulicity [Dulaglutide] Nausea And Vomiting    CURRENT MEDICATIONS:  Outpatient Encounter Medications as of 06/08/2018  Medication Sig  . aspirin EC 81 MG tablet Take 81 mg by mouth daily.  Marland Kitchen atorvastatin (LIPITOR) 80 MG tablet Take 1 tablet (80 mg total) by mouth at bedtime.  Marland Kitchen azelastine (ASTELIN) 0.1 % nasal spray Place 1-2 sprays into both nostrils 2 (two) times daily. Use in each nostril as directed  . citalopram (CELEXA) 20 MG tablet Take 1 tablet (20 mg total) by mouth daily.  . clopidogrel (PLAVIX) 75 MG tablet TAKE 1 TABLET BY MOUTH EVERY DAY  . clotrimazole-betamethasone (LOTRISONE) cream Apply topically 2 (two) times daily. (Patient taking differently: Apply topically 2 (two) times daily as needed. )  . fluticasone (FLONASE) 50 MCG/ACT nasal spray Place 2 sprays into both nostrils daily.  Marland Kitchen glucose blood test strip Check blood sugar 2-3 times daily  . glucose monitoring kit (FREESTYLE) monitoring kit 1 each by Does not apply route as needed for other (Check sugar levels 3 times a day).  . insulin regular (NOVOLIN R,HUMULIN R) 100 units/mL  injection Inject into the skin 2 (two) times daily before a meal. 50 units AM, 30 units PM  . magnesium oxide (MAG-OX) 400 (241.3 Mg) MG tablet Take 1 tablet (400 mg total) by mouth 2 (two) times daily.  Marland Kitchen omeprazole (PRILOSEC) 20 MG capsule Take 1 tablet by mouth twice a day.  . Potassium 99 MG TABS Take 1 tablet by mouth daily.  . ursodiol (ACTIGALL) 300 MG capsule Take 300 mg by mouth 2 (two) times daily.  . nitroGLYCERIN (NITROSTAT) 0.4 MG SL tablet Place 1 tablet (0.4 mg total) under the tongue every 5 (five) minutes as needed for chest pain. (Patient not taking: Reported on 06/08/2018)   No facility-administered encounter medications on file as of 06/08/2018.     PAST MEDICAL HISTORY:   Past Medical History:  Diagnosis Date  . Arthritis   . CAD (coronary artery disease)    a. NSTEMI 12/12: EF 40-45%. [id number] balloon PTCA + Promus DES x 1 to mid LAD;  b. 11/2012: Cath with Cutting balloon PTCA  LAD for ISR to LAD, EF 55%;  c. 12/2012 Cath/PCI: LAD stents patent, 80 ost Diag (jailed)->PTCA, LCX/RCA patent.  . Essential hypertension   . Hyperlipidemia   . Ischemic cardiomyopathy    a.  echo 08/06/11: dist ant wall, apical and septal and infero-apical HK, mild LVH, EF 40%, mild  LAE, PASP 34, asc aorta mildly dilated, mild TR;  b.  Echo (3/13) showed recovery of LV systolic function with EF 47-42%, grade I diastolic dysfunction, mild MR.   . Type 2 diabetes mellitus ([place])     PAST SURGICAL HISTORY:   Past Surgical History:  Procedure Laterality Date  . ANTERIOR CERVICAL DECOMP/DISCECTOMY FUSION  in the 90s  . CORONARY ANGIOPLASTY  12/15/2012; 12/22/2012  . CORONARY ANGIOPLASTY WITH STENT PLACEMENT  07/2011   "1" (12/22/2012)  . GASTRIC BYPASS  12/21/2017  . KNEE ARTHROSCOPY Left 12/31/2016  . LEFT HEART CATHETERIZATION WITH CORONARY ANGIOGRAM N/A 08/06/2011   Procedure: LEFT HEART CATHETERIZATION WITH CORONARY ANGIOGRAM;  Surgeon: Burnell Blanks, MD;  Location: [facility] CATH LAB;   Service: Cardiovascular;  Laterality: N/A;  . LEFT HEART CATHETERIZATION WITH CORONARY ANGIOGRAM N/A 12/15/2012   Procedure: LEFT HEART CATHETERIZATION WITH CORONARY ANGIOGRAM;  Surgeon: Sherren Mocha, MD;  Location: [facility] CATH LAB;  Service: Cardiovascular;  Laterality: N/A;  . LEFT HEART CATHETERIZATION WITH CORONARY ANGIOGRAM N/A 12/22/2012   Procedure: LEFT HEART CATHETERIZATION WITH CORONARY ANGIOGRAM;  Surgeon: Sherren Mocha, MD;  Location: [facility] CATH LAB;  Service: Cardiovascular;  Laterality: N/A;  . PERCUTANEOUS CORONARY STENT INTERVENTION (PCI-S) Right 12/15/2012   Procedure: PERCUTANEOUS CORONARY STENT INTERVENTION (PCI-S);  Surgeon: Sherren Mocha, MD;  Location: [facility] CATH LAB;  Service: Cardiovascular;  Laterality: Right;    SOCIAL HISTORY:   Social History   Socioeconomic History  . Marital status: Married    Spouse name: Not on file  . Number of children: 2   . Years of education: Not on file  . Highest education level: Not on file  Occupational History  . Occupation: Audiological scientist  Social Needs  . Financial resource strain: Not on file  . Food insecurity:    Worry: Not on file    Inability: Not on file  . Transportation needs:    Medical: Not on file    Non-medical: Not on file  Tobacco Use  . Smoking status: Former Smoker    Packs/day: 1.00    Years: 25.00    Pack years: 25.00    Types: Cigarettes    Last attempt to quit: 04/12/1992    Years since quitting: 26.1  . Smokeless tobacco: Never Used  Substance and Sexual Activity  . Alcohol use: Yes    Comment: 12/22/2012 "rarely maybe every 3-4 months, beer"  . Drug use: No  . Sexual activity: Yes  Lifestyle  . Physical activity:    Days per week: Not on file    Minutes per session: Not on file  . Stress: Not on file  Relationships  . Social connections:    Talks on phone: Not on file    Gets together: Not on file    Attends religious service: Not on file    Active member of club or organization: Not on  file    Attends meetings of clubs or organizations: Not on file    Relationship status: Not on file  . Intimate partner violence:    Fear of current or ex partner: Not on file    Emotionally abused: Not on file    Physically abused: Not on file    Forced sexual activity: Not on file  Other Topics Concern  . Not on file  Social History Narrative   Moved back to [place] from [place]-- pt and wife    FAMILY HISTORY:   Family Status  Relation Name Status  .  Brother  Deceased  . Mother  (Not Specified)  . Father  (Not Specified)  . Sister  (Not Specified)  . Neg Hx  (Not Specified)    ROS:  Review of Systems  Constitutional: Positive for weight loss (had gastric bypass in april and has lost 80 lbs).  HENT: Negative.   Eyes: Negative.   Respiratory: Negative.   Cardiovascular: Negative.   Gastrointestinal: Negative.   Genitourinary: Negative.   Musculoskeletal: Negative.   Skin: Negative.   Neurological: Positive for tremors.  Psychiatric/Behavioral: Negative.     PHYSICAL EXAMINATION:    VITALS:   Vitals:   06/08/18 1006  BP: 140/66  Pulse: 74  SpO2: 94%  Weight: 239 lb (108.4 kg)  Height: _0  (1.88 m)   Wt Readings from Last 3 Encounters:  06/08/18 239 lb (108.4 kg)  06/07/18 235 lb 8 oz (106.8 kg)  04/28/18 246 lb 12.8 oz (111.9 kg)     GEN:  The patient appears stated age and is in NAD. HEENT:  Normocephalic, atraumatic.  The mucous membranes are moist. The superficial temporal arteries are without ropiness or tenderness. CV:  RRR Lungs:  CTAB Neck/HEME:  There are no carotid bruits bilaterally.  Neurological examination:  Orientation: The patient is alert and oriented x3. Fund of knowledge is appropriate.  Recent and remote memory are intact.  Attention and concentration are normal.    Able to name objects and repeat phrases. Cranial nerves: There is good facial symmetry. Pupils are equal round and reactive to light bilaterally. Fundoscopic  exam reveals clear margins bilaterally. Extraocular muscles are intact. The visual fields are full to confrontational testing. The speech is fluent and clear. Soft palate rises symmetrically and there is no tongue deviation. Hearing is intact to conversational tone. Sensation: Sensation is intact to light and pinprick throughout (facial, trunk, extremities). Vibration is decreased distally. There is no extinction with double simultaneous stimulation. There is no sensory dermatomal level identified. Motor: Strength is 5/5 in the bilateral upper and lower extremities.   Shoulder shrug is equal and symmetric.  There is no pronator drift. Deep tendon reflexes: Deep tendon reflexes are 0-1/4 at the bilateral biceps, triceps, brachioradialis, patella and achilles. Plantar responses are downgoing bilaterally.  Movement examination: Tone: There is normal tone in the bilateral upper extremities.  The tone in the lower extremities is normal.  Abnormal movements: there is no rest tremor.  There is postural tremor, more on the right than left.  It gets better when given a weight.  He is able to pour water from one glass to another without spilling a significant amount, but tremor is noted in the right hand. Coordination:  There is no decremation with RAM's, with any form of RAMS, including alternating supination and pronation of the forearm, hand opening and closing, finger taps, heel taps and toe taps. Gait and Station: The patient has no difficulty arising out of a deep-seated chair without the use of the hands. The patient's stride length is good with a slightly antalgic gait.    Labs: Lab Results  Component Value Date   TSH 0.92 04/26/2018     Chemistry      Component Value Date/Time   NA 139 04/26/2018 1026   K 4.3 04/26/2018 1026   CL 101 04/26/2018 1026   CO2 30 04/26/2018 1026   BUN 8 04/26/2018 1026   CREATININE 0.73 04/26/2018 1026   CREATININE 1.20 06/15/2016 0849      Component Value  Date/Time  CALCIUM 9.2 04/26/2018 1026   ALKPHOS 111 04/26/2018 1026   AST 12 06/07/2018 0910   ALT 16 06/07/2018 0910   BILITOT 0.5 04/26/2018 1026     Lab Results  Component Value Date   HGBA1C 13.4 (H) 10/26/2017     ASSESSMENT/PLAN:  1.  Essential Tremor.  -This is evidenced by the symmetrical nature and longstanding hx of gradually getting worse.  We discussed nature and pathophysiology.  We discussed that this can continue to gradually get worse with time.  We discussed that some medications can worsen this, as can caffeine use.  We discussed medication therapy as well as surgical therapy.  Ultimately, the patient decided to start primidone, 50 mg nightly.  He will likely need a higher dose than this.  We will call him in 1 month to see how he is doing and to see if an increase would be worthwhile.  He would like to avoid surgical interventions.  The biggest issue is that he works with dangerous equipment doing woodworking and needs a much steadier hand than currently has.  2.  Uncontrolled diabetes  -Patient reports that he is doing better in this regard.  He had gastric bypass in April and that has definitely helped food intake.  On examination, he has evidence of peripheral neuropathy, likely from diabetes.  Discussed safety.  3.  Obstructive sleep apnea syndrome  -On CPAP faithfully.  Discussed morbidity and mortality associate with untreated sleep apnea.  4.  I will see the patient back in follow-up in 4 months.  Cc:  Colon Branch, MD

## 2018-06-07 NOTE — Assessment & Plan Note (Signed)
DM: Since the last office visit, has not seen endocrinology, currently taking: R insulin 40, 50 units. N insulin: 50, 30 units. CBGs remain elevated in the 200 range.  He sees [facility] Endo, will send a referral . Morbid obesity: Has not seen the bariatric team recently, encouraged to follow with them regularly. HTN: On no medications, BP is very good. Hyperlipidemia: Based on last FLP, Lipitor restarted, check a FLP, AST, ALT. CAD: After last visit I  recommended to go back on aspirin.  Good compliance. Tremors: Started in 2011, gradually getting worse, affecting his ADLs.  Likely essential tremor, refer to Dr. Carles Collet. Flu shot today RTC 4 months.

## 2018-06-07 NOTE — Progress Notes (Signed)
Pre visit review using our clinic review tool, if applicable. No additional management support is needed unless otherwise documented below in the visit note. 

## 2018-06-08 ENCOUNTER — Ambulatory Visit (INDEPENDENT_AMBULATORY_CARE_PROVIDER_SITE_OTHER): Payer: PPO | Admitting: Neurology

## 2018-06-08 ENCOUNTER — Encounter: Payer: Self-pay | Admitting: Neurology

## 2018-06-08 VITALS — BP 140/66 | HR 74 | Ht 74.0 in | Wt 239.0 lb

## 2018-06-08 DIAGNOSIS — G250 Essential tremor: Secondary | ICD-10-CM | POA: Diagnosis not present

## 2018-06-08 DIAGNOSIS — E1142 Type 2 diabetes mellitus with diabetic polyneuropathy: Secondary | ICD-10-CM | POA: Diagnosis not present

## 2018-06-08 DIAGNOSIS — E1165 Type 2 diabetes mellitus with hyperglycemia: Secondary | ICD-10-CM | POA: Diagnosis not present

## 2018-06-08 MED ORDER — PRIMIDONE 50 MG PO TABS: 50.0000 mg | ORAL_TABLET | Freq: Every day | ORAL | 2 refills | Status: DC

## 2018-06-08 NOTE — Patient Instructions (Signed)
1. Start Primidone 50 mg tablets. Take 1/2 tablet for 4 nights, then increase to 1 tablet. Prescription has been sent to your pharmacy. The first dose of medication can cause some dizziness/nausea that should go away after the first dose.  We will call you in a month to see how you are doing on medication, this may need to be increased.

## 2018-06-12 DIAGNOSIS — G4733 Obstructive sleep apnea (adult) (pediatric): Secondary | ICD-10-CM | POA: Diagnosis not present

## 2018-06-21 DIAGNOSIS — E1165 Type 2 diabetes mellitus with hyperglycemia: Secondary | ICD-10-CM | POA: Diagnosis not present

## 2018-06-21 DIAGNOSIS — Z79899 Other long term (current) drug therapy: Secondary | ICD-10-CM | POA: Diagnosis not present

## 2018-06-27 DIAGNOSIS — E785 Hyperlipidemia, unspecified: Secondary | ICD-10-CM | POA: Diagnosis not present

## 2018-06-27 DIAGNOSIS — E1159 Type 2 diabetes mellitus with other circulatory complications: Secondary | ICD-10-CM | POA: Diagnosis not present

## 2018-06-27 DIAGNOSIS — I10 Essential (primary) hypertension: Secondary | ICD-10-CM | POA: Diagnosis not present

## 2018-06-27 DIAGNOSIS — E1165 Type 2 diabetes mellitus with hyperglycemia: Secondary | ICD-10-CM | POA: Diagnosis not present

## 2018-06-27 DIAGNOSIS — Z9989 Dependence on other enabling machines and devices: Secondary | ICD-10-CM | POA: Diagnosis not present

## 2018-06-27 DIAGNOSIS — E669 Obesity, unspecified: Secondary | ICD-10-CM | POA: Diagnosis not present

## 2018-06-27 DIAGNOSIS — G4733 Obstructive sleep apnea (adult) (pediatric): Secondary | ICD-10-CM | POA: Diagnosis not present

## 2018-06-27 DIAGNOSIS — Z903 Acquired absence of stomach [part of]: Secondary | ICD-10-CM | POA: Diagnosis not present

## 2018-06-30 ENCOUNTER — Other Ambulatory Visit: Payer: Self-pay | Admitting: Cardiology

## 2018-07-06 ENCOUNTER — Telehealth: Payer: Self-pay | Admitting: Neurology

## 2018-07-06 MED ORDER — PRIMIDONE 50 MG PO TABS: 50.0000 mg | ORAL_TABLET | Freq: Every day | ORAL | 1 refills | Status: DC

## 2018-07-06 NOTE — Telephone Encounter (Signed)
Called patient to see how he is doing on Primidone 50 mg - one tablet daily. He states "it has all but stopped the problem". He is very satisfied with the medication. He has no desire to increase it and complains of no side effects. 90 day supply sent to his pharmacy and he will keep scheduled follow up in March. He will call with any problems/questions prior to appt.  Dr. Carles Collet Juluis Rainier.

## 2018-07-13 DIAGNOSIS — G4733 Obstructive sleep apnea (adult) (pediatric): Secondary | ICD-10-CM | POA: Diagnosis not present

## 2018-08-12 DIAGNOSIS — G4733 Obstructive sleep apnea (adult) (pediatric): Secondary | ICD-10-CM | POA: Diagnosis not present

## 2018-08-18 ENCOUNTER — Other Ambulatory Visit: Payer: Self-pay | Admitting: Internal Medicine

## 2018-09-02 ENCOUNTER — Other Ambulatory Visit: Payer: Self-pay | Admitting: Internal Medicine

## 2018-09-12 DIAGNOSIS — G4733 Obstructive sleep apnea (adult) (pediatric): Secondary | ICD-10-CM | POA: Diagnosis not present

## 2018-09-22 DIAGNOSIS — Z794 Long term (current) use of insulin: Secondary | ICD-10-CM | POA: Diagnosis not present

## 2018-09-22 DIAGNOSIS — E1165 Type 2 diabetes mellitus with hyperglycemia: Secondary | ICD-10-CM | POA: Diagnosis not present

## 2018-09-22 LAB — HEMOGLOBIN A1C: HEMOGLOBIN A1C: 11.2

## 2018-09-23 ENCOUNTER — Other Ambulatory Visit: Payer: Self-pay

## 2018-09-23 MED ORDER — CLOPIDOGREL BISULFATE 75 MG PO TABS: 75.0000 mg | ORAL_TABLET | Freq: Every day | ORAL | 1 refills | Status: DC

## 2018-09-23 MED ORDER — CARVEDILOL 12.5 MG PO TABS: 12.5000 mg | ORAL_TABLET | Freq: Two times a day (BID) | ORAL | 1 refills | Status: DC

## 2018-09-23 NOTE — Telephone Encounter (Signed)
Refilled coreg and plavix, e-mailed pt to make overdue apt was due to come back 05/2018 to see Dr Domenic Polite. One months refills given

## 2018-10-12 ENCOUNTER — Ambulatory Visit (INDEPENDENT_AMBULATORY_CARE_PROVIDER_SITE_OTHER): Payer: PPO | Admitting: Internal Medicine

## 2018-10-12 ENCOUNTER — Encounter: Payer: Self-pay | Admitting: Internal Medicine

## 2018-10-12 VITALS — BP 126/80 | HR 56 | Temp 98.1°F | Resp 16 | Ht 74.0 in | Wt 231.4 lb

## 2018-10-12 DIAGNOSIS — E7800 Pure hypercholesterolemia, unspecified: Secondary | ICD-10-CM

## 2018-10-12 DIAGNOSIS — I499 Cardiac arrhythmia, unspecified: Secondary | ICD-10-CM

## 2018-10-12 DIAGNOSIS — Z23 Encounter for immunization: Secondary | ICD-10-CM

## 2018-10-12 DIAGNOSIS — I10 Essential (primary) hypertension: Secondary | ICD-10-CM | POA: Diagnosis not present

## 2018-10-12 DIAGNOSIS — I2510 Atherosclerotic heart disease of native coronary artery without angina pectoris: Secondary | ICD-10-CM

## 2018-10-12 DIAGNOSIS — E1159 Type 2 diabetes mellitus with other circulatory complications: Secondary | ICD-10-CM

## 2018-10-12 DIAGNOSIS — E1165 Type 2 diabetes mellitus with hyperglycemia: Secondary | ICD-10-CM | POA: Diagnosis not present

## 2018-10-12 LAB — BASIC METABOLIC PANEL
BUN: 11 mg/dL (ref 6–23)
CHLORIDE: 104 meq/L (ref 96–112)
CO2: 31 mEq/L (ref 19–32)
Calcium: 9 mg/dL (ref 8.4–10.5)
Creatinine, Ser: 0.64 mg/dL (ref 0.40–1.50)
GFR: 124.69 mL/min (ref >60.00)
Glucose, Bld: 203 mg/dL — ABNORMAL HIGH (ref 70–99)
POTASSIUM: 4.5 meq/L (ref 3.5–5.1)
Sodium: 141 mEq/L (ref 135–145)

## 2018-10-12 MED ORDER — CLOPIDOGREL BISULFATE 75 MG PO TABS: 75.0000 mg | ORAL_TABLET | Freq: Every day | ORAL | 1 refills | Status: DC

## 2018-10-12 MED ORDER — CARVEDILOL 12.5 MG PO TABS: 12.5000 mg | ORAL_TABLET | Freq: Two times a day (BID) | ORAL | 1 refills | Status: DC

## 2018-10-12 NOTE — Progress Notes (Signed)
Pre visit review using our clinic review tool, if applicable. No additional management support is needed unless otherwise documented below in the visit note. 

## 2018-10-12 NOTE — Patient Instructions (Addendum)
Per our records you are due for an eye exam. Please contact your eye doctor to schedule an appointment. Please have them send copies of your office visit notes to [place]. Our fax number is [phone].  GO TO THE LAB : Get the blood work     GO TO THE FRONT DESK Schedule your next appointment   For a physical exam in 4-5 months, fasting physical exam

## 2018-10-12 NOTE — Progress Notes (Signed)
Subjective:    Patient ID: Randy Castro., male    DOB: [birth year], 68 y.o.   MRN: [id number]  DOS:  10/12/2018 Type of visit - description: rov DM per endo, doing better  Tremors: neurology note reviewed CAD: due to see cards, needs RFs  Wt Readings from Last 3 Encounters:  10/12/18 231 lb 6 oz (105 kg)  06/08/18 239 lb (108.4 kg)  06/07/18 235 lb 8 oz (106.8 kg)    Review of Systems  No CP-SOB No edema   Past Medical History:  Diagnosis Date  . Arthritis   . CAD (coronary artery disease)    a. NSTEMI 12/12: EF 40-45%. GQB:[id number] balloon PTCA + Promus DES x 1 to mid LAD;  b. 11/2012: Cath with Cutting balloon PTCA  LAD for ISR to LAD, EF 55%;  c. 12/2012 Cath/PCI: LAD stents patent, 80 ost Diag (jailed)->PTCA, LCX/RCA patent.  . Essential hypertension   . Hyperlipidemia   . Ischemic cardiomyopathy    a.  echo 08/06/11: dist ant wall, apical and septal and infero-apical HK, mild LVH, EF 40%, mild LAE, PASP 34, asc aorta mildly dilated, mild TR;  b.  Echo (3/13) showed recovery of LV systolic function with EF 88-82%, grade I diastolic dysfunction, mild MR.   . Type 2 diabetes mellitus ([place])     Past Surgical History:  Procedure Laterality Date  . ANTERIOR CERVICAL DECOMP/DISCECTOMY FUSION  in the 90s  . CORONARY ANGIOPLASTY  12/15/2012; 12/22/2012  . CORONARY ANGIOPLASTY WITH STENT PLACEMENT  07/2011   "1" (12/22/2012)  . GASTRIC BYPASS  12/21/2017  . KNEE ARTHROSCOPY Left 12/31/2016  . LEFT HEART CATHETERIZATION WITH CORONARY ANGIOGRAM N/A 08/06/2011   Procedure: LEFT HEART CATHETERIZATION WITH CORONARY ANGIOGRAM;  Surgeon: Burnell Blanks, MD;  Location: [facility] CATH LAB;  Service: Cardiovascular;  Laterality: N/A;  . LEFT HEART CATHETERIZATION WITH CORONARY ANGIOGRAM N/A 12/15/2012   Procedure: LEFT HEART CATHETERIZATION WITH CORONARY ANGIOGRAM;  Surgeon: Sherren Mocha, MD;  Location: [facility] CATH LAB;  Service: Cardiovascular;  Laterality: N/A;  . LEFT HEART CATHETERIZATION  WITH CORONARY ANGIOGRAM N/A 12/22/2012   Procedure: LEFT HEART CATHETERIZATION WITH CORONARY ANGIOGRAM;  Surgeon: Sherren Mocha, MD;  Location: [facility] CATH LAB;  Service: Cardiovascular;  Laterality: N/A;  . PERCUTANEOUS CORONARY STENT INTERVENTION (PCI-S) Right 12/15/2012   Procedure: PERCUTANEOUS CORONARY STENT INTERVENTION (PCI-S);  Surgeon: Sherren Mocha, MD;  Location: [facility] CATH LAB;  Service: Cardiovascular;  Laterality: Right;    Social History   Socioeconomic History  . Marital status: Married    Spouse name: Not on file  . Number of children: 2   . Years of education: Not on file  . Highest education level: Not on file  Occupational History  . Occupation: Audiological scientist  Social Needs  . Financial resource strain: Not on file  . Food insecurity:    Worry: Not on file    Inability: Not on file  . Transportation needs:    Medical: Not on file    Non-medical: Not on file  Tobacco Use  . Smoking status: Former Smoker    Packs/day: 1.00    Years: 25.00    Pack years: 25.00    Types: Cigarettes    Last attempt to quit: 04/12/1992    Years since quitting: 26.5  . Smokeless tobacco: Never Used  Substance and Sexual Activity  . Alcohol use: Yes    Comment: 12/22/2012 "rarely maybe every 3-4 months, beer"  . Drug use: No  . Sexual  activity: Yes  Lifestyle  . Physical activity:    Days per week: Not on file    Minutes per session: Not on file  . Stress: Not on file  Relationships  . Social connections:    Talks on phone: Not on file    Gets together: Not on file    Attends religious service: Not on file    Active member of club or organization: Not on file    Attends meetings of clubs or organizations: Not on file    Relationship status: Not on file  . Intimate partner violence:    Fear of current or ex partner: Not on file    Emotionally abused: Not on file    Physically abused: Not on file    Forced sexual activity: Not on file  Other Topics Concern  . Not on  file  Social History Narrative   Moved back to [place] from [place]-- pt and wife      Allergies as of 10/12/2018      Reactions   Trulicity [dulaglutide] Nausea And Vomiting      Medication List       Accurate as of October 12, 2018  5:13 PM. Always use your most recent med list.        aspirin EC 81 MG tablet Take 81 mg by mouth daily.   atorvastatin 80 MG tablet Commonly known as:  LIPITOR Take 1 tablet (80 mg total) by mouth at bedtime.   azelastine 0.1 % nasal spray Commonly known as:  ASTELIN Place 1-2 sprays into both nostrils 2 (two) times daily. Use in each nostril as directed   carvedilol 12.5 MG tablet Commonly known as:  COREG Take 1 tablet (12.5 mg total) by mouth 2 (two) times daily.   citalopram 20 MG tablet Commonly known as:  CELEXA TAKE 1 TABLET(20 MG) BY MOUTH DAILY   clopidogrel 75 MG tablet Commonly known as:  PLAVIX Take 1 tablet (75 mg total) by mouth daily.   clotrimazole-betamethasone cream Commonly known as:  LOTRISONE Apply topically 2 (two) times daily.   fluticasone 50 MCG/ACT nasal spray Commonly known as:  FLONASE Place 2 sprays into both nostrils daily.   glucose blood test strip Check blood sugar 2-3 times daily   glucose monitoring kit monitoring kit 1 each by Does not apply route as needed for other (Check sugar levels 3 times a day).   magnesium oxide 400 (241.3 Mg) MG tablet Commonly known as:  MAGNESIUM-OXIDE Take 1 tablet (400 mg total) by mouth 2 (two) times daily.   nitroGLYCERIN 0.4 MG SL tablet Commonly known as:  NITROSTAT Place 1 tablet (0.4 mg total) under the tongue every 5 (five) minutes as needed for chest pain.   NOVOLIN N 100 UNIT/ML injection Generic drug:  insulin NPH Human Inject into the skin. 25 units in morning, 30 units in evening   omeprazole 20 MG capsule Commonly known as:  PRILOSEC Take 1 tablet by mouth twice a day.   Potassium 99 MG Tabs Take 1 tablet by mouth daily.     primidone 50 MG tablet Commonly known as:  MYSOLINE Take 1 tablet (50 mg total) by mouth at bedtime.   ursodiol 300 MG capsule Commonly known as:  ACTIGALL Take 300 mg by mouth 2 (two) times daily.           Objective:   Physical Exam BP 126/80 (BP Location: Left Arm, Patient Position: Sitting, Cuff Size: Normal)  Pulse (!) 56   Temp 98.1 F (36.7 C) (Oral)   Resp 16   Ht 6' 2"  (1.88 m)   Wt 231 lb 6 oz (105 kg)   SpO2 96%   BMI 29.71 kg/m  General:   Well developed, NAD, BMI noted. HEENT:  Normocephalic . Face symmetric, atraumatic Lungs:  CTA B Normal respiratory effort, no intercostal retractions, no accessory muscle use. Heart: RRR,  no murmur.  No pretibial edema bilaterally  Skin: Not pale. Not jaundice Neurologic:  alert & oriented X3.  Speech normal, gait appropriate for age and unassisted Psych--  Cognition and judgment appear intact.  Cooperative with normal attention span and concentration.  Behavior appropriate. No anxious or depressed appearing.      Assessment     Assessment   DM -- insulin dependent, uncontrolled, with complications, CAD CHF.  Endo: [place] HTN Hyperlipidemia CAD, CHF, ischemic cardiomyopathy, PVCs (Holter 2019 show 13.6% burden) NSTEMI in 12/12, unstable angina in 4/14 and 5/14. He had DES to mid LAD in 12/12. EF was 40% with apical hypokinesis at the time of the MI. Repeat echo in 3/13 showed EF 55-60%. In 4/14, he was admitted with unstable angina and had 90% pLAD in-stent restenosis. This was treated with cutting balloon angioplasty. Readmitted, again with unstable angina, in 5/14. This time he had PTCA to 90% ostial D1 stenosis.  OSA dx ~08-2017, om Cpap Anxiety-depression Skin cancer: Right leg, status post excision 2016; Dr Allyson Sabal Morbid obesity:gastric sleeve, 11-2017  PLAN: HTN BP is good today, ran out of carvedilol, refill sent.   Essential tremor: Evaluated by neurology 06/08/2018,  prescribed primidone, sxs improved DM: Has seen endocrinology a couple of times, insulin  adjusted, was encourage healthy lifestyle. Doing better w/ life style, counseled. Amb CBGs in the 150 (previously 200-250).  CAD: Ran out of Plavix, refill sent, asymptomatic.  Refer to cardiology for routine checkup H/o Morbid obesity: reports is checked  by his bariatric surgeon regulalrly  RTC 4-5 m CPX

## 2018-10-12 NOTE — Assessment & Plan Note (Signed)
HTN BP is good today, ran out of carvedilol, refill sent.   Essential tremor: Evaluated by neurology 06/08/2018, prescribed primidone, sxs improved DM: Has seen endocrinology a couple of times, insulin  adjusted, was encourage healthy lifestyle. Doing better w/ life style, counseled. Amb CBGs in the 150 (previously 200-250).  CAD: Ran out of Plavix, refill sent, asymptomatic.  Refer to cardiology for routine checkup Morbid obesity: reports is checked  by his bariatric surgeon regulalrly  RTC 4-5 m CPX

## 2018-10-13 DIAGNOSIS — G4733 Obstructive sleep apnea (adult) (pediatric): Secondary | ICD-10-CM | POA: Diagnosis not present

## 2018-10-28 ENCOUNTER — Ambulatory Visit: Payer: PPO | Admitting: Neurology

## 2018-11-03 ENCOUNTER — Encounter: Payer: Self-pay | Admitting: Cardiology

## 2018-11-03 ENCOUNTER — Ambulatory Visit (INDEPENDENT_AMBULATORY_CARE_PROVIDER_SITE_OTHER): Payer: PPO | Admitting: Cardiology

## 2018-11-03 ENCOUNTER — Other Ambulatory Visit: Payer: Self-pay

## 2018-11-03 VITALS — BP 118/60 | HR 62 | Ht 74.0 in | Wt 230.0 lb

## 2018-11-03 DIAGNOSIS — E785 Hyperlipidemia, unspecified: Secondary | ICD-10-CM | POA: Diagnosis not present

## 2018-11-03 DIAGNOSIS — I25119 Atherosclerotic heart disease of native coronary artery with unspecified angina pectoris: Secondary | ICD-10-CM

## 2018-11-03 DIAGNOSIS — I2510 Atherosclerotic heart disease of native coronary artery without angina pectoris: Secondary | ICD-10-CM | POA: Diagnosis not present

## 2018-11-03 DIAGNOSIS — I493 Ventricular premature depolarization: Secondary | ICD-10-CM | POA: Diagnosis not present

## 2018-11-03 DIAGNOSIS — I255 Ischemic cardiomyopathy: Secondary | ICD-10-CM

## 2018-11-03 DIAGNOSIS — I10 Essential (primary) hypertension: Secondary | ICD-10-CM

## 2018-11-03 DIAGNOSIS — E782 Mixed hyperlipidemia: Secondary | ICD-10-CM

## 2018-11-03 MED ORDER — NITROGLYCERIN 0.4 MG SL SUBL: 0.4000 mg | SUBLINGUAL_TABLET | SUBLINGUAL | 3 refills | Status: DC | PRN

## 2018-11-03 NOTE — Patient Instructions (Signed)
Medication Instructions:  Your physician recommends that you continue on your current medications as directed. Please refer to the Current Medication list given to you today.  If you need a refill on your cardiac medications before your next appointment, please call your pharmacy.   Lab work: None today If you have labs (blood work) drawn today and your tests are completely normal, you will receive your results only by: Marland Kitchen MyChart Message (if you have MyChart) OR . A paper copy in the mail If you have any lab test that is abnormal or we need to change your treatment, we will call you to review the results.  Testing/Procedures: Your physician has requested that you have an echocardiogram. Echocardiography is a painless test that uses sound waves to create images of your heart. It provides your doctor with information about the size and shape of your heart and how well your heart's chambers and valves are working. This procedure takes approximately one hour. There are no restrictions for this procedure.    Folow-Up:  6 months with Dr.McDowell At [facility], you and your health needs are our priority.  As part of our continuing mission to provide you with exceptional heart care, we have created designated Provider Care Teams.  These Care Teams include your primary Cardiologist (physician) and Advanced Practice Providers (APPs -  Physician Assistants and Nurse Practitioners) who all work together to provide you with the care you need, when you need it. .   Any Other Special Instructions Will Be Listed Below (If Applicable). NONE

## 2018-11-03 NOTE — Progress Notes (Signed)
Cardiology Office Note  Date: 11/03/2018   ID: Randy Castro., DOB [birth year], MRN [id number]  PCP: Colon Branch, MD  Primary Cardiologist: Rozann Lesches, MD   Chief Complaint  Patient presents with  . Coronary Artery Disease    History of Present Illness: Randy Castro. is a 68 y.o. male last seen in April 2018 by Ms. Strader PA-C. He is here today with his wife, overdue for follow-up.  Since he was last seen he underwent gastric bypass surgery and has lost nearly 90 pounds.  He states that he feels much better in terms of energy and shortness of breath.  He does not indicate any angina symptoms or nitroglycerin use.  I reviewed his medications which are outlined below.  Current cardiac regimen includes aspirin, Lipitor, Coreg, Plavix, and as needed nitroglycerin.  Follow-up Myoview in April 2018 revealed evidence of previous inferior wall infarct, LVEF 30 to 44%, not confirmed by echocardiogram.  Previous cardiac monitor demonstrated frequent PVCs.  His last echocardiogram was in 2018 at which time his LVEF was 50 to 55%.  He does not report any palpitations or syncope.  Past Medical History:  Diagnosis Date  . Arthritis   . CAD (coronary artery disease)    a. NSTEMI 12/12: EF 40-45%. JYN:[id number] balloon PTCA + Promus DES x 1 to mid LAD;  b. 11/2012: Cath with Cutting balloon PTCA  LAD for ISR to LAD, EF 55%;  c. 12/2012 Cath/PCI: LAD stents patent, 80 ost Diag (jailed)->PTCA, LCX/RCA patent.  . Essential hypertension   . Hyperlipidemia   . Ischemic cardiomyopathy    a.  echo 08/06/11: dist ant wall, apical and septal and infero-apical HK, mild LVH, EF 40%, mild LAE, PASP 34, asc aorta mildly dilated, mild TR;  b.  Echo (3/13) showed recovery of LV systolic function with EF 30-86%, grade I diastolic dysfunction, mild MR.   . Type 2 diabetes mellitus ([place])     Past Surgical History:  Procedure Laterality Date  . ANTERIOR CERVICAL DECOMP/DISCECTOMY FUSION  in the 90s  .  CORONARY ANGIOPLASTY  12/15/2012; 12/22/2012  . CORONARY ANGIOPLASTY WITH STENT PLACEMENT  07/2011   "1" (12/22/2012)  . GASTRIC BYPASS  12/21/2017  . KNEE ARTHROSCOPY Left 12/31/2016  . LEFT HEART CATHETERIZATION WITH CORONARY ANGIOGRAM N/A 08/06/2011   Procedure: LEFT HEART CATHETERIZATION WITH CORONARY ANGIOGRAM;  Surgeon: Burnell Blanks, MD;  Location: [facility] CATH LAB;  Service: Cardiovascular;  Laterality: N/A;  . LEFT HEART CATHETERIZATION WITH CORONARY ANGIOGRAM N/A 12/15/2012   Procedure: LEFT HEART CATHETERIZATION WITH CORONARY ANGIOGRAM;  Surgeon: Sherren Mocha, MD;  Location: [facility] CATH LAB;  Service: Cardiovascular;  Laterality: N/A;  . LEFT HEART CATHETERIZATION WITH CORONARY ANGIOGRAM N/A 12/22/2012   Procedure: LEFT HEART CATHETERIZATION WITH CORONARY ANGIOGRAM;  Surgeon: Sherren Mocha, MD;  Location: [facility] CATH LAB;  Service: Cardiovascular;  Laterality: N/A;  . PERCUTANEOUS CORONARY STENT INTERVENTION (PCI-S) Right 12/15/2012   Procedure: PERCUTANEOUS CORONARY STENT INTERVENTION (PCI-S);  Surgeon: Sherren Mocha, MD;  Location: [facility] CATH LAB;  Service: Cardiovascular;  Laterality: Right;    Current Outpatient Medications  Medication Sig Dispense Refill  . aspirin EC 81 MG tablet Take 81 mg by mouth daily.    Marland Kitchen atorvastatin (LIPITOR) 80 MG tablet Take 1 tablet (80 mg total) by mouth at bedtime. 90 tablet 1  . azelastine (ASTELIN) 0.1 % nasal spray Place 1-2 sprays into both nostrils 2 (two) times daily. Use in each nostril as directed 30 mL 5  .  carvedilol (COREG) 12.5 MG tablet Take 1 tablet (12.5 mg total) by mouth 2 (two) times daily. 180 tablet 1  . citalopram (CELEXA) 20 MG tablet TAKE 1 TABLET(20 MG) BY MOUTH DAILY 90 tablet 1  . clopidogrel (PLAVIX) 75 MG tablet Take 1 tablet (75 mg total) by mouth daily. 90 tablet 1  . clotrimazole-betamethasone (LOTRISONE) cream Apply topically 2 (two) times daily. (Patient taking differently: Apply topically 2 (two) times daily as needed. ) 30 g  0  . fluticasone (FLONASE) 50 MCG/ACT nasal spray Place 2 sprays into both nostrils daily. 16 g 1  . glucose blood test strip Check blood sugar 2-3 times daily 100 each 12  . glucose monitoring kit (FREESTYLE) monitoring kit 1 each by Does not apply route as needed for other (Check sugar levels 3 times a day). 1 each 0  . insulin NPH Human (NOVOLIN N) 100 UNIT/ML injection Inject into the skin. 25 units in morning, 30 units in evening    . magnesium oxide (MAGNESIUM-OXIDE) 400 (241.3 Mg) MG tablet Take 1 tablet (400 mg total) by mouth 2 (two) times daily. 180 tablet 1  . nitroGLYCERIN (NITROSTAT) 0.4 MG SL tablet Place 1 tablet (0.4 mg total) under the tongue every 5 (five) minutes as needed for chest pain. 100 tablet 3  . omeprazole (PRILOSEC) 20 MG capsule Take 1 tablet by mouth twice a day.    . Potassium 99 MG TABS Take 1 tablet by mouth daily.    . primidone (MYSOLINE) 50 MG tablet Take 1 tablet (50 mg total) by mouth at bedtime. 90 tablet 1  . ursodiol (ACTIGALL) 300 MG capsule Take 300 mg by mouth 2 (two) times daily.     No current facility-administered medications for this visit.    Allergies:  Trulicity [dulaglutide]   Social History: The patient  reports that he quit smoking about 26 years ago. His smoking use included cigarettes. He has a 25.00 pack-year smoking history. He has never used smokeless tobacco. He reports current alcohol use. He reports that he does not use drugs.   ROS:  Please see the history of present illness. Otherwise, complete review of systems is positive for none.  All other systems are reviewed and negative.   Physical Exam: VS:  BP 118/60 (BP Location: Right Arm)   Pulse 62   Ht 6' 2"  (1.88 m)   Wt 230 lb (104.3 kg)   SpO2 93%   BMI 29.53 kg/m , BMI Body mass index is 29.53 kg/m.  Wt Readings from Last 3 Encounters:  11/03/18 230 lb (104.3 kg)  10/12/18 231 lb 6 oz (105 kg)  06/08/18 239 lb (108.4 kg)    General: Patient appears comfortable at  rest. HEENT: Conjunctiva and lids normal, oropharynx clear. Neck: Supple, no elevated JVP or carotid bruits, no thyromegaly. Lungs: Clear to auscultation, nonlabored breathing at rest. Cardiac: Regular rate and rhythm with frequent ectopy, no S3, soft systolic murmur. Abdomen: Soft, nontender, bowel sounds present, no guarding or rebound. Extremities: No pitting edema, distal pulses 2+. Skin: Warm and dry. Musculoskeletal: No kyphosis. Neuropsychiatric: Alert and oriented x3, affect grossly appropriate.  ECG: I personally reviewed the tracing from 11/14/2017 which showed sinus rhythm with frequent PVCs.  Recent Labwork: 04/26/2018: Hemoglobin 14.0; Magnesium 1.6; Platelets 214.0; TSH 0.92 06/07/2018: ALT 16; AST 12 10/12/2018: BUN 11; Creatinine, Ser 0.64; Potassium 4.5; Sodium 141     Component Value Date/Time   CHOL 122 06/07/2018 0910   TRIG 191.0 (H) 06/07/2018 0910  HDL 34.10 (L) 06/07/2018 0910   CHOLHDL 4 06/07/2018 0910   VLDL 38.2 06/07/2018 0910   LDLCALC 49 06/07/2018 0910   LDLDIRECT 145.0 04/26/2018 1026    Other Studies Reviewed Today:  Holter monitor 11/18/2017: 24-hour Holter monitor reviewed.  Sinus rhythm is present throughout.  Heart rate ranged from 49 bpm up to 106 bpm with average heart rate 68 bpm.  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  PVC volume represented 13.6% of total beats.  Echocardiogram 02/23/2017: Study Conclusions  - Left ventricle: The cavity size was normal. Wall thickness was   increased in a pattern of mild LVH. Systolic function was normal.   The estimated ejection fraction was in the range of 50% to 55%.   Wall motion was normal; there were no regional wall motion   abnormalities. Doppler parameters are consistent with abnormal   left ventricular relaxation (grade 1 diastolic dysfunction). - Aortic valve: Valve area (VTI): 3.26 cm^2. Valve area (Vmax):   3.26 cm^2. Valve area (Vmean): 2.74 cm^2. -  Aorta: Aortic root dimension: 40 mm (ED). - Aortic root: The aortic root was mildly dilated. - Technically adequate study.  Lexiscan Myoview 12/15/2017:  There was no ST segment deviation noted during stress.  Findings consistent with larege prior inferior myocardial infarction.  This is a high risk study. Risked based on decreased LVEF. There is no current myocardium at jeopardy. Consider correlating LVEF with echocardiogram.  The left ventricular ejection fraction is moderately decreased (30-44%).  Assessment and Plan:  1.  CAD with history of previous LAD interventions, most recently cutting balloon for in-stent restenosis in 2014 and subsequently angioplasty of a jailed diagonal branch.  He does not report active angina at this time.  Refill provided for bottle of nitroglycerin.  Otherwise continue with present medical regimen.  2.  Ischemic cardiomyopathy, LVEF has fluctuated over time based on modality, last in the range of 50 to 55% by echocardiogram in 2018.  A follow-up study will be obtained.  3.  Frequent PVCs, asymptomatic in terms of palpitations and no history of syncope.  He is on Coreg.  4.  Hyperlipidemia, on Lipitor.  Most recent LDL was 49 in October 2019.  Current medicines were reviewed with the patient today.   Orders Placed This Encounter  Procedures  . ECHOCARDIOGRAM COMPLETE    Disposition: Follow-up in 6 months.  Signed, Satira Sark, MD, [facility] 11/03/2018 2:14 PM    [facility] at [facility] [address] [place], [place] [address] Phone: [phone]; Fax: [phone]

## 2018-11-16 ENCOUNTER — Other Ambulatory Visit: Payer: Self-pay

## 2018-11-16 ENCOUNTER — Telehealth: Payer: PPO | Admitting: Internal Medicine

## 2018-11-16 DIAGNOSIS — R109 Unspecified abdominal pain: Secondary | ICD-10-CM

## 2018-11-16 NOTE — Progress Notes (Signed)
  Virtual Visit via Telephone Note  I connected with Randy Castro. on 11/16/18 at 11:20 AM EDT by telephone and verified that I am speaking with the correct person using two identifiers.   I discussed the limitations, risks, security and privacy concerns of performing an evaluation and management service by telephone and the availability of in person appointments. I also discussed with the patient that there may be a patient responsible charge related to this service. The patient expressed understanding and agreed to proceed.   History of Present Illness: Patient reports abdominal pain. Symptoms started 2 weeks ago, described on and off upper abdominal pain, associated with some nausea, usually worse after he eats. After eating, symptoms lasted about an hour. He has noted a lot of "rumbling" sounds in the abdomen and increased flatulence. Pain decreased with Tylenol, has also tried some ibuprofen/naproxen but is unclear as of how many has he taken.  ROS Denies fever chills or URI symptoms. Appetite is normal. No recent antibiotics No vomiting. He had some diarrhea, also for the last couple of weeks, usually at night, watery/loose, with no blood.    Observations/Objective: This is a phone visit  Assessment and Plan: Abdominal pain for 2 weeks. Etiology unclear, needs to be seen, we agreed for a office visit tomorrow at 8 AM. If he gets worse, needs to call immediately.  If symptoms severe go to the ER Stop all NSAIDs, okay aspirin and the rest of the medications Aware that cannot come to the office if he has respiratory symptoms.   Follow Up Instructions:    I discussed the assessment and treatment plan with the patient. The patient was provided an opportunity to ask questions and all were answered. The patient agreed with the plan and demonstrated an understanding of the instructions.   The patient was advised to call back or seek an in-person evaluation if the symptoms  worsen or if the condition fails to improve as anticipated.  I provided 15 minutes of non-face-to-face time during this encounter.   Kathlene November, MD

## 2018-11-17 ENCOUNTER — Ambulatory Visit (INDEPENDENT_AMBULATORY_CARE_PROVIDER_SITE_OTHER): Payer: PPO | Admitting: Internal Medicine

## 2018-11-17 ENCOUNTER — Other Ambulatory Visit: Payer: Self-pay

## 2018-11-17 ENCOUNTER — Encounter: Payer: Self-pay | Admitting: Internal Medicine

## 2018-11-17 VITALS — BP 132/80 | HR 74 | Temp 98.0°F | Resp 16 | Ht 74.0 in | Wt 230.4 lb

## 2018-11-17 DIAGNOSIS — Z09 Encounter for follow-up examination after completed treatment for conditions other than malignant neoplasm: Secondary | ICD-10-CM | POA: Diagnosis not present

## 2018-11-17 DIAGNOSIS — R197 Diarrhea, unspecified: Secondary | ICD-10-CM

## 2018-11-17 DIAGNOSIS — R109 Unspecified abdominal pain: Secondary | ICD-10-CM | POA: Insufficient documentation

## 2018-11-17 LAB — CBC WITH DIFFERENTIAL/PLATELET
Basophils Absolute: 0.1 10*3/uL (ref 0.0–0.1)
Basophils Relative: 0.9 % (ref 0.0–3.0)
Eosinophils Absolute: 0.4 10*3/uL (ref 0.0–0.7)
Eosinophils Relative: 5.3 % — ABNORMAL HIGH (ref 0.0–5.0)
HCT: 35.7 % — ABNORMAL LOW (ref 39.0–52.0)
Hemoglobin: 12.4 g/dL — ABNORMAL LOW (ref 13.0–17.0)
LYMPHS ABS: 2.1 10*3/uL (ref 0.7–4.0)
Lymphocytes Relative: 27.4 % (ref 12.0–46.0)
MCHC: 34.8 g/dL (ref 30.0–36.0)
MCV: 85.5 fl (ref 78.0–100.0)
MONOS PCT: 7.8 % (ref 3.0–12.0)
Monocytes Absolute: 0.6 10*3/uL (ref 0.1–1.0)
NEUTROS PCT: 58.6 % (ref 43.0–77.0)
Neutro Abs: 4.6 10*3/uL (ref 1.4–7.7)
Platelets: 238 10*3/uL (ref 150.0–400.0)
RBC: 4.18 Mil/uL — ABNORMAL LOW (ref 4.22–5.81)
RDW: 13.5 % (ref 11.5–15.5)
WBC: 7.8 10*3/uL (ref 4.0–10.5)

## 2018-11-17 LAB — COMPREHENSIVE METABOLIC PANEL
ALT: 14 U/L (ref 0–53)
AST: 13 U/L (ref 0–37)
Albumin: 3.9 g/dL (ref 3.5–5.2)
Alkaline Phosphatase: 88 U/L (ref 39–117)
BILIRUBIN TOTAL: 0.5 mg/dL (ref 0.2–1.2)
BUN: 9 mg/dL (ref 6–23)
CHLORIDE: 104 meq/L (ref 96–112)
CO2: 31 mEq/L (ref 19–32)
CREATININE: 0.58 mg/dL (ref 0.40–1.50)
Calcium: 8.8 mg/dL (ref 8.4–10.5)
GFR: 139.65 mL/min (ref >60.00)
Glucose, Bld: 159 mg/dL — ABNORMAL HIGH (ref 70–99)
Potassium: 4.1 mEq/L (ref 3.5–5.1)
Sodium: 144 mEq/L (ref 135–145)
Total Protein: 5.9 g/dL — ABNORMAL LOW (ref 6.0–8.3)

## 2018-11-17 LAB — MAGNESIUM: Magnesium: 1.4 mg/dL — ABNORMAL LOW (ref 1.5–2.5)

## 2018-11-17 MED ORDER — DICYCLOMINE HCL 10 MG PO CAPS: 10.0000 mg | ORAL_CAPSULE | Freq: Three times a day (TID) | ORAL | 0 refills | Status: DC | PRN

## 2018-11-17 MED ORDER — ONDANSETRON HCL 4 MG PO TABS: 4.0000 mg | ORAL_TABLET | Freq: Three times a day (TID) | ORAL | 0 refills | Status: DC | PRN

## 2018-11-17 NOTE — Progress Notes (Signed)
Pre visit review using our clinic review tool, if applicable. No additional management support is needed unless otherwise documented below in the visit note. 

## 2018-11-17 NOTE — Progress Notes (Signed)
Subjective:    Patient ID: Randy Foots., male    DOB: 01-12-1951, 68 y.o.   MRN: [id number]  DOS:  11/17/2018 Type of visit - description: Acute visit Symptoms started 2 weeks ago: He is experiencing lower > upper abdominal pain for 2 weeks. The pain is triggered by simply drinking some water and more so if eating solid food. It is associated with some nausea, and the pain is described as sharp and colicky. When asked, he also reports diarrhea described as very loose and occasionally watery stools, is not abundant, typically 1 or 2 episodes a day. Pain decreased with Tylenol   Review of Systems No fever chills No vomiting, no blood in the stools. Has not taken any antibiotics lately He takes plenty of Tylenol but ibuprofen has taking only 3 or 4 times this week.  Past Medical History:  Diagnosis Date  . Arthritis   . CAD (coronary artery disease)    a. NSTEMI 12/12: EF 40-45%. XUX:YBFXOVA balloon PTCA + Promus DES x 1 to mid LAD;  b. 11/2012: Cath with Cutting balloon PTCA  LAD for ISR to LAD, EF 55%;  c. 12/2012 Cath/PCI: LAD stents patent, 80 ost Diag (jailed)->PTCA, LCX/RCA patent.  . Essential hypertension   . Hyperlipidemia   . Ischemic cardiomyopathy    a.  echo 08/06/11: dist ant wall, apical and septal and infero-apical HK, mild LVH, EF 40%, mild LAE, PASP 34, asc aorta mildly dilated, mild TR;  b.  Echo (3/13) showed recovery of LV systolic function with EF 91-91%, grade I diastolic dysfunction, mild MR.   . Type 2 diabetes mellitus ([place])     Past Surgical History:  Procedure Laterality Date  . ANTERIOR CERVICAL DECOMP/DISCECTOMY FUSION  in the 90s  . CORONARY ANGIOPLASTY  12/15/2012; 12/22/2012  . CORONARY ANGIOPLASTY WITH STENT PLACEMENT  07/2011   "1" (12/22/2012)  . GASTRIC BYPASS  12/21/2017  . KNEE ARTHROSCOPY Left 12/31/2016  . LEFT HEART CATHETERIZATION WITH CORONARY ANGIOGRAM N/A 08/06/2011   Procedure: LEFT HEART CATHETERIZATION WITH CORONARY ANGIOGRAM;   Surgeon: Burnell Blanks, MD;  Location: [facility] CATH LAB;  Service: Cardiovascular;  Laterality: N/A;  . LEFT HEART CATHETERIZATION WITH CORONARY ANGIOGRAM N/A 12/15/2012   Procedure: LEFT HEART CATHETERIZATION WITH CORONARY ANGIOGRAM;  Surgeon: Sherren Mocha, MD;  Location: [facility] CATH LAB;  Service: Cardiovascular;  Laterality: N/A;  . LEFT HEART CATHETERIZATION WITH CORONARY ANGIOGRAM N/A 12/22/2012   Procedure: LEFT HEART CATHETERIZATION WITH CORONARY ANGIOGRAM;  Surgeon: Sherren Mocha, MD;  Location: [facility] CATH LAB;  Service: Cardiovascular;  Laterality: N/A;  . PERCUTANEOUS CORONARY STENT INTERVENTION (PCI-S) Right 12/15/2012   Procedure: PERCUTANEOUS CORONARY STENT INTERVENTION (PCI-S);  Surgeon: Sherren Mocha, MD;  Location: [facility] CATH LAB;  Service: Cardiovascular;  Laterality: Right;    Social History   Socioeconomic History  . Marital status: Married    Spouse name: Not on file  . Number of children: 2   . Years of education: Not on file  . Highest education level: Not on file  Occupational History  . Occupation: Audiological scientist  Social Needs  . Financial resource strain: Not on file  . Food insecurity:    Worry: Not on file    Inability: Not on file  . Transportation needs:    Medical: Not on file    Non-medical: Not on file  Tobacco Use  . Smoking status: Former Smoker    Packs/day: 1.00    Years: 25.00    Pack years: 25.00  Types: Cigarettes    Last attempt to quit: 04/12/1992    Years since quitting: 26.6  . Smokeless tobacco: Never Used  Substance and Sexual Activity  . Alcohol use: Yes    Comment: 12/22/2012 "rarely maybe every 3-4 months, beer"  . Drug use: No  . Sexual activity: Yes  Lifestyle  . Physical activity:    Days per week: Not on file    Minutes per session: Not on file  . Stress: Not on file  Relationships  . Social connections:    Talks on phone: Not on file    Gets together: Not on file    Attends religious service: Not on file     Active member of club or organization: Not on file    Attends meetings of clubs or organizations: Not on file    Relationship status: Not on file  . Intimate partner violence:    Fear of current or ex partner: Not on file    Emotionally abused: Not on file    Physically abused: Not on file    Forced sexual activity: Not on file  Other Topics Concern  . Not on file  Social History Narrative   Moved back to [place] from [place]-- pt and wife      Allergies as of 11/17/2018      Reactions   Trulicity [dulaglutide] Nausea And Vomiting      Medication List       Accurate as of November 17, 2018 11:59 PM. Always use your most recent med list.        aspirin EC 81 MG tablet Take 81 mg by mouth daily.   atorvastatin 80 MG tablet Commonly known as:  LIPITOR Take 1 tablet (80 mg total) by mouth at bedtime.   azelastine 0.1 % nasal spray Commonly known as:  ASTELIN Place 1-2 sprays into both nostrils 2 (two) times daily. Use in each nostril as directed   carvedilol 12.5 MG tablet Commonly known as:  COREG Take 1 tablet (12.5 mg total) by mouth 2 (two) times daily.   citalopram 20 MG tablet Commonly known as:  CELEXA TAKE 1 TABLET(20 MG) BY MOUTH DAILY   clopidogrel 75 MG tablet Commonly known as:  PLAVIX Take 1 tablet (75 mg total) by mouth daily.   clotrimazole-betamethasone cream Commonly known as:  LOTRISONE Apply topically 2 (two) times daily.   dicyclomine 10 MG capsule Commonly known as:  BENTYL Take 1-2 capsules (10-20 mg total) by mouth 3 (three) times daily as needed for spasms.   fluticasone 50 MCG/ACT nasal spray Commonly known as:  FLONASE Place 2 sprays into both nostrils daily.   glucose blood test strip Check blood sugar 2-3 times daily   glucose monitoring kit monitoring kit 1 each by Does not apply route as needed for other (Check sugar levels 3 times a day).   magnesium oxide 400 (241.3 Mg) MG tablet Commonly known as:  MAGnesium-Oxide  Take 1 tablet (400 mg total) by mouth 2 (two) times daily.   nitroGLYCERIN 0.4 MG SL tablet Commonly known as:  NITROSTAT Place 1 tablet (0.4 mg total) under the tongue every 5 (five) minutes as needed for chest pain.   NovoLIN N 100 UNIT/ML injection Generic drug:  insulin NPH Human Inject into the skin. 25 units in morning, 30 units in evening   omeprazole 20 MG capsule Commonly known as:  PRILOSEC Take 1 tablet by mouth twice a day.   ondansetron 4  MG tablet Commonly known as:  ZOFRAN Take 1 tablet (4 mg total) by mouth every 8 (eight) hours as needed for nausea or vomiting.   Potassium 99 MG Tabs Take 1 tablet by mouth daily.   primidone 50 MG tablet Commonly known as:  MYSOLINE Take 1 tablet (50 mg total) by mouth at bedtime.   ursodiol 300 MG capsule Commonly known as:  ACTIGALL Take 300 mg by mouth 2 (two) times daily.           Objective:   Physical Exam BP 132/80 (BP Location: Left Arm, Patient Position: Sitting, Cuff Size: Normal)   Pulse 74   Temp 98 F (36.7 C) (Oral)   Resp 16   Ht 6' 2"  (1.88 m)   Wt 230 lb 6 oz (104.5 kg)   SpO2 98%   BMI 29.58 kg/m  General:   Well developed, NAD, BMI noted.  HEENT:  Normocephalic . Face symmetric, atraumatic Lungs:  CTA B Normal respiratory effort, no intercostal retractions, no accessory muscle use. Heart: RRR,  no murmur.  no pretibial edema bilaterally.  Good femoral pulses Abdomen:  Not distended, soft, increased bowel sounds, no bruit.  Minimal if any diffuse tenderness, no rebound or mass. Skin: Not pale. Not jaundice Neurologic:  alert & oriented X3.  Speech normal, gait appropriate for age and unassisted Psych--  Cognition and judgment appear intact.  Cooperative with normal attention span and concentration.  Behavior appropriate. No anxious or depressed appearing.     Assessment     Assessment   DM -- insulin dependent, uncontrolled, with complications, CAD CHF.  Endo: [place] HTN Hyperlipidemia CAD, CHF, ischemic cardiomyopathy, PVCs (Holter 2019 show 13.6% burden) NSTEMI in 12/12, unstable angina in 4/14 and 5/14. He had DES to mid LAD in 12/12. EF was 40% with apical hypokinesis at the time of the MI. Repeat echo in 3/13 showed EF 55-60%. In 4/14, he was admitted with unstable angina and had 90% pLAD in-stent restenosis. This was treated with cutting balloon angioplasty. Readmitted, again with unstable angina, in 5/14. This time he had PTCA to 90% ostial D1 stenosis.  OSA dx ~08-2017, om Cpap Anxiety-depression Skin cancer: Right leg, status post excision 2016; Dr Allyson Sabal Morbid obesity:gastric sleeve, 11-2017  PLAN: Acute diarrhea: Symptoms consistent with acute diarrhea associated with abdominal cramping.  Vital signs are stable, nontoxic-appearing, no blood in the stools. Plan: CMP, CBC, magnesium level.  GI pathogens panel, WBCs Control symptoms with Bentyl and Zofran Push fluids Call if not gradually improving, ER if symptoms severe. Next visit schedule for ~ 04/2019

## 2018-11-17 NOTE — Patient Instructions (Addendum)
GO TO THE LAB : Get the blood work Please get a container and bring a stool sample as soon as you can   Drink plenty of clear fluids Follow a bland diet No ibuprofen Tylenol is okay For nausea: Zofran as needed For cramps: Bentyl as needed Call if not gradually better Call or go to the ER if increased pain, unable to pass gas or constipation. Also if fever chills.

## 2018-11-18 ENCOUNTER — Other Ambulatory Visit: Payer: PPO

## 2018-11-18 DIAGNOSIS — R197 Diarrhea, unspecified: Secondary | ICD-10-CM | POA: Diagnosis not present

## 2018-11-18 NOTE — Progress Notes (Signed)
labs

## 2018-11-18 NOTE — Assessment & Plan Note (Signed)
Acute diarrhea: Symptoms consistent with acute diarrhea associated with abdominal cramping.  Vital signs are stable, nontoxic-appearing, no blood in the stools. Plan: CMP, CBC, magnesium level.  GI pathogens panel, WBCs Control symptoms with Bentyl and Zofran Push fluids Call if not gradually improving, ER if symptoms severe. Next visit schedule for ~ 04/2019

## 2018-11-18 NOTE — Addendum Note (Signed)
Addended by: Kelle Darting A on: 11/18/2018 09:11 AM   Modules accepted: Orders

## 2018-11-21 LAB — GASTROINTESTINAL PATHOGEN PANEL PCR
C. difficile Tox A/B, PCR: NOT DETECTED
Campylobacter, PCR: NOT DETECTED
Cryptosporidium, PCR: NOT DETECTED
E COLI (STEC) STX1/STX2, PCR: NOT DETECTED
E coli (ETEC) LT/ST PCR: NOT DETECTED
E coli 0157, PCR: NOT DETECTED
Giardia lamblia, PCR: NOT DETECTED
Norovirus, PCR: NOT DETECTED
Rotavirus A, PCR: NOT DETECTED
Salmonella, PCR: NOT DETECTED
Shigella, PCR: NOT DETECTED

## 2018-11-21 LAB — FECAL LACTOFERRIN, QUANT
Fecal Lactoferrin: POSITIVE — AB
MICRO NUMBER:: 358662
SPECIMEN QUALITY:: ADEQUATE

## 2018-11-23 ENCOUNTER — Other Ambulatory Visit: Payer: PPO

## 2018-12-19 ENCOUNTER — Other Ambulatory Visit: Payer: Self-pay | Admitting: Internal Medicine

## 2018-12-20 NOTE — Telephone Encounter (Signed)
If he is not better, needs a virtual visit.

## 2018-12-20 NOTE — Telephone Encounter (Signed)
Pt requesting refill on Bentyl and Zofran. Was seen 11/17/2018 for diarrhea. Please advise.

## 2018-12-20 NOTE — Telephone Encounter (Signed)
Spoke w/ Pt- doing better, intermittently having diarrhea and abdominal pains after eating, he wanted to see if he could keep a supply of Bentyl and Zofran on hand because they worked so well. I informed Pt I would let PCP know and see what he decides, informed Pt that PCP may still request virtual visit. Pt verbalized understanding.

## 2018-12-20 NOTE — Telephone Encounter (Signed)
Advise patient, I sent the refills, needs virtual visit in 10 days if he is not back to normal

## 2018-12-20 NOTE — Telephone Encounter (Signed)
Tried calling Pt- no answer, unable to leave message. If Pt calls back, okay for PEC to discuss.

## 2018-12-22 DIAGNOSIS — Z7984 Long term (current) use of oral hypoglycemic drugs: Secondary | ICD-10-CM | POA: Diagnosis not present

## 2018-12-22 DIAGNOSIS — I10 Essential (primary) hypertension: Secondary | ICD-10-CM | POA: Diagnosis not present

## 2018-12-22 DIAGNOSIS — E1165 Type 2 diabetes mellitus with hyperglycemia: Secondary | ICD-10-CM | POA: Diagnosis not present

## 2018-12-22 DIAGNOSIS — E1129 Type 2 diabetes mellitus with other diabetic kidney complication: Secondary | ICD-10-CM | POA: Diagnosis not present

## 2018-12-22 DIAGNOSIS — E782 Mixed hyperlipidemia: Secondary | ICD-10-CM | POA: Diagnosis not present

## 2018-12-22 DIAGNOSIS — R809 Proteinuria, unspecified: Secondary | ICD-10-CM | POA: Diagnosis not present

## 2018-12-22 LAB — HEMOGLOBIN A1C: Hemoglobin A1C: 9.8

## 2019-01-19 NOTE — Progress Notes (Signed)
Virtual Visit via Video Note The purpose of this virtual visit is to provide medical care while limiting exposure to the novel coronavirus.    Consent was obtained for video visit:  Yes.   Answered questions that patient had about telehealth interaction:  Yes.   I discussed the limitations, risks, security and privacy concerns of performing an evaluation and management service by telemedicine. I also discussed with the patient that there may be a patient responsible charge related to this service. The patient expressed understanding and agreed to proceed.  Pt location: [employer] (states no good wi fi at home) Physician Location: home Name of referring provider:  Colon Branch, MD I connected with [name] at patients initiation/request on 01/20/2019 at 10:30 AM EDT by video enabled telemedicine application and verified that I am speaking with the correct person using two identifiers. Pt MRN:  [id number] Pt DOB:  1950/10/11 Video Participants:  [name];     History of Present Illness:  Pt seen in f/u.  Haven't seen him since last October, at which time we started him on primidone, 50 mg daily, for ET.  He states that he takes it in the AM.  We talked with him a month later and he stated that it worked really well.  States today that just recently he has noted a re-emergence of tremor but is still at least 80% better.  The records that were made available to me were reviewed.  Has seen PCP, endocrinology and cardiology since last visit.  Noted that A1C was still 9.8 (was 11.2 at end of Jan).  "those candy bars keep getting in the way."   Current Outpatient Medications on File Prior to Visit  Medication Sig Dispense Refill  . aspirin EC 81 MG tablet Take 81 mg by mouth daily.    Marland Kitchen atorvastatin (LIPITOR) 80 MG tablet Take 1 tablet (80 mg total) by mouth at bedtime. 90 tablet 1  . azelastine (ASTELIN) 0.1 % nasal spray Place 1-2 sprays into both nostrils 2 (two) times daily.  Use in each nostril as directed 30 mL 5  . citalopram (CELEXA) 20 MG tablet TAKE 1 TABLET(20 MG) BY MOUTH DAILY 90 tablet 1  . clopidogrel (PLAVIX) 75 MG tablet Take 1 tablet (75 mg total) by mouth daily. 90 tablet 1  . clotrimazole-betamethasone (LOTRISONE) cream Apply topically 2 (two) times daily. (Patient taking differently: Apply topically 2 (two) times daily as needed. ) 30 g 0  . dicyclomine (BENTYL) 10 MG capsule TAKE 1 TO 2 CAPSULES(10 TO 20 MG) BY MOUTH THREE TIMES DAILY AS NEEDED FOR SPASMS 30 capsule 0  . fluticasone (FLONASE) 50 MCG/ACT nasal spray Place 2 sprays into both nostrils daily. 16 g 1  . glucose blood test strip Check blood sugar 2-3 times daily 100 each 12  . glucose monitoring kit (FREESTYLE) monitoring kit 1 each by Does not apply route as needed for other (Check sugar levels 3 times a day). 1 each 0  . insulin NPH Human (NOVOLIN N) 100 UNIT/ML injection Inject into the skin. 25 units in morning, 30 units in evening    . magnesium oxide (MAGNESIUM-OXIDE) 400 (241.3 Mg) MG tablet Take 1 tablet (400 mg total) by mouth 2 (two) times daily. 180 tablet 1  . nitroGLYCERIN (NITROSTAT) 0.4 MG SL tablet Place 1 tablet (0.4 mg total) under the tongue every 5 (five) minutes as needed for chest pain. 100 tablet 3  . omeprazole (PRILOSEC) 20  MG capsule Take 1 tablet by mouth twice a day.    . ondansetron (ZOFRAN) 4 MG tablet TAKE 1 TABLET(4 MG) BY MOUTH EVERY 8 HOURS AS NEEDED FOR NAUSEA OR VOMITING 20 tablet 0  . Potassium 99 MG TABS Take 1 tablet by mouth daily.    . primidone (MYSOLINE) 50 MG tablet Take 1 tablet (50 mg total) by mouth at bedtime. 90 tablet 1  . ursodiol (ACTIGALL) 300 MG capsule Take 300 mg by mouth 2 (two) times daily.    . carvedilol (COREG) 12.5 MG tablet Take 1 tablet (12.5 mg total) by mouth 2 (two) times daily. 180 tablet 1   No current facility-administered medications on file prior to visit.      Observations/Objective:   Vitals:   01/20/19 0847   Weight: 220 lb (99.8 kg)  Height: 6' 2" (1.88 m)   GEN:  The patient appears stated age and is in NAD.  Neurological examination:  Orientation: The patient is alert and oriented x3. Cranial nerves: There is good facial symmetry. There is no facial hypomimia.  The speech is fluent and clear. Soft palate rises symmetrically and there is no tongue deviation. Hearing is intact to conversational tone. Motor: Strength is at least antigravity x 4.   Shoulder shrug is equal and symmetric.  There is no pronator drift.  Movement examination: Tone: unable Abnormal movements: none seen.  I did not see any tremor of the outstretched hands.  I did not see any intention tremor. Coordination:  There is no decremation with RAM's, with any form of RAMS, including alternating supination and pronation of the forearm, hand opening and closing, finger taps, heel taps and toe taps. Gait and Station: The patient walks well    Assessment and Plan:   1.  Essential Tremor.             -Patient has been very pleased with primidone, 50 mg daily, but has noticed in recent reemergence of tremor.  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  We will try to increase primidone to 50 mg twice per day.    2.  Uncontrolled diabetes             -seeing endocrinology.    -s/p gastric bypass 1 year ago  3.  Obstructive sleep apnea syndrome             -On CPAP faithfully.  Discussed morbidity and mortality associate with untreated sleep apnea.  Follow Up Instructions:  6 months  -I discussed the assessment and treatment plan with the patient. The patient was provided an opportunity to ask questions and all were answered. The patient agreed with the plan and demonstrated an understanding of the instructions.   The patient was advised to call back or seek an in-person evaluation if the symptoms worsen or if the condition fails to improve as anticipated.    Total Time spent in  visit with the patient was:  15 min, of which more than 50% of the time was spent in counseling as above.   Pt understands and agrees with the plan of care outlined.     Randy Bogus, DO

## 2019-01-20 ENCOUNTER — Encounter: Payer: Self-pay | Admitting: Neurology

## 2019-01-20 ENCOUNTER — Telehealth (INDEPENDENT_AMBULATORY_CARE_PROVIDER_SITE_OTHER): Payer: PPO | Admitting: Neurology

## 2019-01-20 ENCOUNTER — Other Ambulatory Visit: Payer: Self-pay

## 2019-01-20 DIAGNOSIS — G250 Essential tremor: Secondary | ICD-10-CM

## 2019-01-24 ENCOUNTER — Telehealth: Payer: Self-pay | Admitting: Cardiology

## 2019-01-24 NOTE — Telephone Encounter (Signed)
° °  COVID-19 Pre-Screening Questions: ° °• Do you currently have a fever?NO ° ° °• Have you recently travelled on a cruise, internationally, or to [place], [place], [place], [place], [place], or [place], [place] ([employer]) ? NO °•  °• Have you been in contact with someone that is currently pending confirmation of Covid19 testing or has been confirmed to have the Covid19 virus?  NO °•  °Are you currently experiencing fatigue or cough? NO ° ° °   ° ° ° ° °

## 2019-01-25 ENCOUNTER — Ambulatory Visit (INDEPENDENT_AMBULATORY_CARE_PROVIDER_SITE_OTHER): Payer: PPO

## 2019-01-25 DIAGNOSIS — I255 Ischemic cardiomyopathy: Secondary | ICD-10-CM | POA: Diagnosis not present

## 2019-02-16 ENCOUNTER — Ambulatory Visit: Payer: PPO | Admitting: Neurology

## 2019-03-02 ENCOUNTER — Other Ambulatory Visit: Payer: Self-pay | Admitting: Internal Medicine

## 2019-03-02 ENCOUNTER — Other Ambulatory Visit: Payer: Self-pay

## 2019-03-02 MED ORDER — PRIMIDONE 50 MG PO TABS: 50.0000 mg | ORAL_TABLET | Freq: Every day | ORAL | 1 refills | Status: DC

## 2019-03-02 NOTE — Telephone Encounter (Signed)
Requested Prescriptions   Pending Prescriptions Disp Refills  . primidone (MYSOLINE) 50 MG tablet 90 tablet 1    Sig: Take 1 tablet (50 mg total) by mouth at bedtime.   Rx last filled:07/06/18 #90 1 REFILLS  Pt last seen:01/20/19  Follow up appt scheduled:07/24/19

## 2019-03-03 DIAGNOSIS — Z903 Acquired absence of stomach [part of]: Secondary | ICD-10-CM | POA: Diagnosis not present

## 2019-03-03 DIAGNOSIS — R110 Nausea: Secondary | ICD-10-CM | POA: Diagnosis not present

## 2019-03-03 DIAGNOSIS — R1011 Right upper quadrant pain: Secondary | ICD-10-CM | POA: Diagnosis not present

## 2019-03-13 DIAGNOSIS — K8020 Calculus of gallbladder without cholecystitis without obstruction: Secondary | ICD-10-CM | POA: Diagnosis not present

## 2019-03-13 DIAGNOSIS — R1011 Right upper quadrant pain: Secondary | ICD-10-CM | POA: Diagnosis not present

## 2019-03-13 DIAGNOSIS — Z9989 Dependence on other enabling machines and devices: Secondary | ICD-10-CM | POA: Diagnosis not present

## 2019-03-13 DIAGNOSIS — G4733 Obstructive sleep apnea (adult) (pediatric): Secondary | ICD-10-CM | POA: Diagnosis not present

## 2019-03-13 DIAGNOSIS — E1165 Type 2 diabetes mellitus with hyperglycemia: Secondary | ICD-10-CM | POA: Diagnosis not present

## 2019-03-13 DIAGNOSIS — E1159 Type 2 diabetes mellitus with other circulatory complications: Secondary | ICD-10-CM | POA: Diagnosis not present

## 2019-03-19 LAB — HM DIABETES FOOT EXAM

## 2019-03-21 DIAGNOSIS — Z794 Long term (current) use of insulin: Secondary | ICD-10-CM | POA: Diagnosis not present

## 2019-03-21 DIAGNOSIS — Z9989 Dependence on other enabling machines and devices: Secondary | ICD-10-CM | POA: Diagnosis not present

## 2019-03-21 DIAGNOSIS — K8020 Calculus of gallbladder without cholecystitis without obstruction: Secondary | ICD-10-CM | POA: Diagnosis not present

## 2019-03-21 DIAGNOSIS — Z01812 Encounter for preprocedural laboratory examination: Secondary | ICD-10-CM | POA: Diagnosis not present

## 2019-03-21 DIAGNOSIS — G4733 Obstructive sleep apnea (adult) (pediatric): Secondary | ICD-10-CM | POA: Diagnosis not present

## 2019-03-21 DIAGNOSIS — E1165 Type 2 diabetes mellitus with hyperglycemia: Secondary | ICD-10-CM | POA: Diagnosis not present

## 2019-03-21 DIAGNOSIS — Z1159 Encounter for screening for other viral diseases: Secondary | ICD-10-CM | POA: Diagnosis not present

## 2019-03-21 DIAGNOSIS — E1159 Type 2 diabetes mellitus with other circulatory complications: Secondary | ICD-10-CM | POA: Diagnosis not present

## 2019-03-27 DIAGNOSIS — K8020 Calculus of gallbladder without cholecystitis without obstruction: Secondary | ICD-10-CM | POA: Diagnosis not present

## 2019-03-27 DIAGNOSIS — R7309 Other abnormal glucose: Secondary | ICD-10-CM | POA: Diagnosis not present

## 2019-03-27 DIAGNOSIS — Z9884 Bariatric surgery status: Secondary | ICD-10-CM | POA: Diagnosis not present

## 2019-03-27 DIAGNOSIS — K8012 Calculus of gallbladder with acute and chronic cholecystitis without obstruction: Secondary | ICD-10-CM | POA: Diagnosis not present

## 2019-03-27 DIAGNOSIS — K8010 Calculus of gallbladder with chronic cholecystitis without obstruction: Secondary | ICD-10-CM | POA: Diagnosis not present

## 2019-03-27 DIAGNOSIS — K660 Peritoneal adhesions (postprocedural) (postinfection): Secondary | ICD-10-CM | POA: Diagnosis not present

## 2019-03-27 HISTORY — PX: CHOLECYSTECTOMY: SHX55

## 2019-04-03 ENCOUNTER — Encounter: Payer: Self-pay | Admitting: Internal Medicine

## 2019-04-20 DIAGNOSIS — E782 Mixed hyperlipidemia: Secondary | ICD-10-CM | POA: Diagnosis not present

## 2019-04-20 DIAGNOSIS — R809 Proteinuria, unspecified: Secondary | ICD-10-CM | POA: Diagnosis not present

## 2019-04-20 DIAGNOSIS — E1129 Type 2 diabetes mellitus with other diabetic kidney complication: Secondary | ICD-10-CM | POA: Diagnosis not present

## 2019-04-20 DIAGNOSIS — E1165 Type 2 diabetes mellitus with hyperglycemia: Secondary | ICD-10-CM | POA: Diagnosis not present

## 2019-04-20 DIAGNOSIS — I10 Essential (primary) hypertension: Secondary | ICD-10-CM | POA: Diagnosis not present

## 2019-04-20 LAB — HEMOGLOBIN A1C: Hemoglobin A1C: 8.2

## 2019-04-29 ENCOUNTER — Other Ambulatory Visit: Payer: Self-pay | Admitting: Internal Medicine

## 2019-04-30 ENCOUNTER — Other Ambulatory Visit: Payer: Self-pay | Admitting: Internal Medicine

## 2019-05-03 ENCOUNTER — Encounter: Payer: Self-pay | Admitting: Internal Medicine

## 2019-05-03 ENCOUNTER — Other Ambulatory Visit: Payer: Self-pay

## 2019-05-03 NOTE — Progress Notes (Signed)
Subjective:   Randy Castro. is a 68 y.o. male who presents for Medicare Annual/Subsequent preventive examination.  Review of Systems:    Home Safety/Smoke Alarms: Feels safe in home. Smoke alarms in place.  Lives in 2 story house with wife and dog and cat.  Male:   CCS- Cologuard 01/25/17-neg PSA-  Lab Results  Component Value Date   PSA 0.35 12/05/2014   PSA 0.44 05/31/2013   PSA 0.66 10/28/2011       Objective:    Vitals: BP (!) 139/48 (BP Location: Left Arm, Patient Position: Sitting, Cuff Size: Normal) Comment: All vitals done by K.Canter CMA  in PCP appt prior.  Pulse (!) 50   Temp (!) 97.2 F (36.2 C)   Ht _0  (1.88 m)   Wt 219 lb (99.3 kg)   SpO2 99%   BMI 28.12 kg/m   Body mass index is 28.12 kg/m.  Advanced Directives 05/04/2019 01/20/2019 04/28/2018 11/14/2017 11/14/2017 11/14/2017 04/27/2017  Does Patient Have a Medical Advance Directive? _1  No No  Would patient like information on creating a medical advance directive? No - Patient declined No - Patient declined Yes (MAU/Ambulatory/Procedural Areas - Information given) No - Patient declined No - Patient declined - No - Patient declined  Pre-existing out of facility DNR order (yellow form or pink MOST form) - - - - - - -    Tobacco Social History   Tobacco Use  Smoking Status Former Smoker  . Packs/day: 1.00  . Years: 25.00  . Pack years: 25.00  . Types: Cigarettes  . Quit date: 04/12/1992  . Years since quitting: 27.0  Smokeless Tobacco Never Used     Counseling given: Not Answered   Clinical Intake:  Pain : No/denies pain    Past Medical History:  Diagnosis Date  . Arthritis   . CAD (coronary artery disease)    a. NSTEMI 12/12: EF 40-45%. YEL:YHTMBPJ balloon PTCA + Promus DES x 1 to mid LAD;  b. 11/2012: Cath with Cutting balloon PTCA  LAD for ISR to LAD, EF 55%;  c. 12/2012 Cath/PCI: LAD stents patent, 80 ost Diag (jailed)->PTCA, LCX/RCA patent.  . Essential hypertension   .  Hyperlipidemia   . Ischemic cardiomyopathy    a.  echo 08/06/11: dist ant wall, apical and septal and infero-apical HK, mild LVH, EF 40%, mild LAE, PASP 34, asc aorta mildly dilated, mild TR;  b.  Echo (3/13) showed recovery of LV systolic function with EF 12-16%, grade I diastolic dysfunction, mild MR.   . Type 2 diabetes mellitus ([place])    Past Surgical History:  Procedure Laterality Date  . ANTERIOR CERVICAL DECOMP/DISCECTOMY FUSION  in the 90s  . CHOLECYSTECTOMY  03/27/2019  . CORONARY ANGIOPLASTY  12/15/2012; 12/22/2012  . CORONARY ANGIOPLASTY WITH STENT PLACEMENT  07/2011   "1" (12/22/2012)  . GASTRIC BYPASS  12/21/2017  . KNEE ARTHROSCOPY Left 12/31/2016  . LEFT HEART CATHETERIZATION WITH CORONARY ANGIOGRAM N/A 08/06/2011   Procedure: LEFT HEART CATHETERIZATION WITH CORONARY ANGIOGRAM;  Surgeon: Burnell Blanks, MD;  Location: [facility] CATH LAB;  Service: Cardiovascular;  Laterality: N/A;  . LEFT HEART CATHETERIZATION WITH CORONARY ANGIOGRAM N/A 12/15/2012   Procedure: LEFT HEART CATHETERIZATION WITH CORONARY ANGIOGRAM;  Surgeon: Sherren Mocha, MD;  Location: [facility] CATH LAB;  Service: Cardiovascular;  Laterality: N/A;  . LEFT HEART CATHETERIZATION WITH CORONARY ANGIOGRAM N/A 12/22/2012   Procedure: LEFT HEART CATHETERIZATION WITH CORONARY ANGIOGRAM;  Surgeon: Sherren Mocha, MD;  Location:  [facility] CATH LAB;  Service: Cardiovascular;  Laterality: N/A;  . PERCUTANEOUS CORONARY STENT INTERVENTION (PCI-S) Right 12/15/2012   Procedure: PERCUTANEOUS CORONARY STENT INTERVENTION (PCI-S);  Surgeon: Sherren Mocha, MD;  Location: [facility] CATH LAB;  Service: Cardiovascular;  Laterality: Right;   Family History  Problem Relation Age of Onset  . Diabetes Brother   . Lung cancer Brother   . Diabetes Mother   . Coronary artery disease Father   . Heart attack Father 51  . Diabetes Sister   . Coronary artery disease Sister   . Colon cancer Neg Hx   . Prostate cancer Neg Hx   . Stroke Neg Hx    Social History    Socioeconomic History  . Marital status: Married    Spouse name: Not on file  . Number of children: 2   . Years of education: Not on file  . Highest education level: Not on file  Occupational History  . Occupation: Audiological scientist  Social Needs  . Financial resource strain: Not on file  . Food insecurity    Worry: Not on file    Inability: Not on file  . Transportation needs    Medical: Not on file    Non-medical: Not on file  Tobacco Use  . Smoking status: Former Smoker    Packs/day: 1.00    Years: 25.00    Pack years: 25.00    Types: Cigarettes    Quit date: 04/12/1992    Years since quitting: 27.0  . Smokeless tobacco: Never Used  Substance and Sexual Activity  . Alcohol use: Yes    Comment: 12/22/2012 "rarely maybe every 3-4 months, beer"  . Drug use: No  . Sexual activity: Yes  Lifestyle  . Physical activity    Days per week: Not on file    Minutes per session: Not on file  . Stress: Not on file  Relationships  . Social Herbalist on phone: Not on file    Gets together: Not on file    Attends religious service: Not on file    Active member of club or organization: Not on file    Attends meetings of clubs or organizations: Not on file    Relationship status: Not on file  Other Topics Concern  . Not on file  Social History Narrative   Moved back to [place] from [place]-- pt and wife    Outpatient Encounter Medications as of 05/04/2019  Medication Sig  . aspirin EC 81 MG tablet Take 81 mg by mouth daily.  Marland Kitchen atorvastatin (LIPITOR) 80 MG tablet Take 1 tablet (80 mg total) by mouth at bedtime.  Marland Kitchen azelastine (ASTELIN) 0.1 % nasal spray Place 1-2 sprays into both nostrils 2 (two) times daily. Use in each nostril as directed  . carvedilol (COREG) 12.5 MG tablet Take 1 tablet (12.5 mg total) by mouth 2 (two) times daily with a meal.  . citalopram (CELEXA) 20 MG tablet TAKE 1 TABLET(20 MG) BY MOUTH DAILY  . clopidogrel (PLAVIX) 75 MG tablet  Take 1 tablet (75 mg total) by mouth daily.  . clotrimazole-betamethasone (LOTRISONE) cream Apply topically 2 (two) times daily. (Patient taking differently: Apply topically 2 (two) times daily as needed. )  . dicyclomine (BENTYL) 10 MG capsule TAKE 1 TO 2 CAPSULES(10 TO 20 MG) BY MOUTH THREE TIMES DAILY AS NEEDED FOR SPASMS  . fluticasone (FLONASE) 50 MCG/ACT nasal spray Place 2 sprays into both nostrils daily.  Marland Kitchen glucose blood  test strip Check blood sugar 2-3 times daily  . glucose monitoring kit (FREESTYLE) monitoring kit 1 each by Does not apply route as needed for other (Check sugar levels 3 times a day).  . insulin NPH Human (NOVOLIN N) 100 UNIT/ML injection Inject into the skin. 25 units in morning, 30 units in evening  . magnesium oxide (MAGNESIUM-OXIDE) 400 (241.3 Mg) MG tablet Take 1 tablet (400 mg total) by mouth 2 (two) times daily.  . nitroGLYCERIN (NITROSTAT) 0.4 MG SL tablet Place 1 tablet (0.4 mg total) under the tongue every 5 (five) minutes as needed for chest pain.  Marland Kitchen omeprazole (PRILOSEC) 20 MG capsule Take 1 tablet by mouth twice a day.  . ondansetron (ZOFRAN) 4 MG tablet TAKE 1 TABLET(4 MG) BY MOUTH EVERY 8 HOURS AS NEEDED FOR NAUSEA OR VOMITING  . Potassium 99 MG TABS Take 1 tablet by mouth daily.  . primidone (MYSOLINE) 50 MG tablet Take 1 tablet (50 mg total) by mouth at bedtime.  . ursodiol (ACTIGALL) 300 MG capsule Take 300 mg by mouth 2 (two) times daily.   No facility-administered encounter medications on file as of 05/04/2019.     Activities of Daily Living In your present state of health, do you have any difficulty performing the following activities: 05/04/2019 05/04/2019  Hearing? N N  Vision? N N  Difficulty concentrating or making decisions? N N  Walking or climbing stairs? N N  Dressing or bathing? N N  Doing errands, shopping? N N  Preparing Food and eating ? N -  Using the Toilet? N -  In the past six months, have you accidently leaked urine? N -  Do  you have problems with loss of bowel control? N -  Managing your Medications? N -  Managing your Finances? N -  Housekeeping or managing your Housekeeping? N -  Some recent data might be hidden    Patient Care Team: Colon Branch, MD as PCP - General (Internal Medicine) Satira Sark, MD as PCP - Cardiology (Cardiology) Larey Dresser, MD as Consulting Physician (Cardiology) Lake Bells., MD as Referring Physician (Gastroenterology) Gearlean Alf, PA-C as Physician Assistant (Pulmonary Disease) Demetrius Revel, MD as Consulting Physician (Bariatrics) Bernie Covey, Javier Glazier, NP as Nurse Practitioner (Endocrinology) [name], [name], DO as Consulting Physician (Neurology)   Assessment:   This is a routine wellness examination for Hisham. Physical assessment deferred to PCP.  Exercise Activities and Dietary recommendations Current Exercise Habits: Home exercise routine, Time (Minutes): 15, Frequency (Times/Week): 7, Weekly Exercise (Minutes/Week): 105, Exercise limited by: None identified   Diet (meal preparation, eat out, water intake, caffeinated beverages, dairy products, fruits and vegetables): 24 hr recall Breakfast: bagel w/ cream cheese Lunch: hot dog Dinner:   Vegetable plate.  Drinks 2-3 glasses of water per day  Goals    . Increase physical activity     Exercise daily        Fall Risk Fall Risk  05/04/2019 05/04/2019 01/20/2019 06/08/2018 04/28/2018  Falls in the past year? 0 0 0 No No  Number falls in past yr: - - 0 - -  Injury with Fall? - - 0 - -  Follow up - Falls evaluation completed - - -   Depression Screen PHQ 2/9 Scores 05/04/2019 10/12/2018 04/28/2018 04/27/2017  PHQ - 2 Score 0 0 0 0  PHQ- 9 Score - 0 - -  Exception Documentation - - - -    Cognitive Function Ad8 score reviewed for issues:  Issues making decisions:no  Less interest in hobbies / activities:no  Repeats questions, stories (family complaining):no  Trouble using ordinary gadgets  (microwave, computer, phone):no  Forgets the month or year: no  Mismanaging finances: no  Remembering appts:no  Daily problems with thinking and/or memory:no Ad8 score is=0     MMSE - Mini Mental State Exam 04/27/2017  Orientation to time 5  Orientation to Place 5  Registration 3  Attention/ Calculation 5  Recall 3  Language- name 2 objects 2  Language- repeat 1  Language- follow 3 step command 3  Language- read & follow direction 1  Write a sentence 1  Copy design 1  Total score 30        Immunization History  Administered Date(s) Administered  . Fluad Quad(high Dose 65+) 05/04/2019  . Influenza Split 06/30/2011  . Influenza, High Dose Seasonal PF 05/31/2013, 05/27/2017, 06/07/2018  . Influenza, Seasonal, Injecte, Preservative Fre 07/28/2012  . Influenza,inj,Quad PF,6+ Mos 05/29/2014, 06/07/2015, 04/23/2016  . Pneumococcal Conjugate-13 12/05/2014  . Pneumococcal Polysaccharide-23 08/07/2011, 10/12/2018  . Tdap 05/31/2013  . Zoster 11/28/2012   Screening Tests Health Maintenance  Topic Date Due  . OPHTHALMOLOGY EXAM  03/29/2018  . HEMOGLOBIN A1C  10/21/2019  . Fecal DNA (Cologuard)  01/26/2020  . FOOT EXAM  03/18/2020  . TETANUS/TDAP  06/01/2023  . INFLUENZA VACCINE  Completed  . Hepatitis C Screening  Completed  . PNA vac Low Risk Adult  Completed   Plan:   See you next year!  Keep up the great work!   I have personally reviewed and noted the following in the patient's chart:   . Medical and social history . Use of alcohol, tobacco or illicit drugs  . Current medications and supplements . Functional ability and status . Nutritional status . Physical activity . Advanced directives . List of other physicians . Hospitalizations, surgeries, and ER visits in previous 12 months . Vitals . Screenings to include cognitive, depression, and falls . Referrals and appointments  In addition, I have reviewed and discussed with patient certain preventive  protocols, quality metrics, and best practice recommendations. A written personalized care plan for preventive services as well as general preventive health recommendations were provided to patient.     Shela Nevin, [place]  05/04/2019

## 2019-05-04 ENCOUNTER — Ambulatory Visit (INDEPENDENT_AMBULATORY_CARE_PROVIDER_SITE_OTHER): Payer: PPO | Admitting: *Deleted

## 2019-05-04 ENCOUNTER — Encounter: Payer: Self-pay | Admitting: Internal Medicine

## 2019-05-04 ENCOUNTER — Ambulatory Visit (INDEPENDENT_AMBULATORY_CARE_PROVIDER_SITE_OTHER): Payer: PPO | Admitting: Internal Medicine

## 2019-05-04 ENCOUNTER — Encounter: Payer: Self-pay | Admitting: *Deleted

## 2019-05-04 VITALS — BP 139/48 | HR 50 | Temp 97.2°F | Ht 74.0 in | Wt 219.0 lb

## 2019-05-04 VITALS — BP 139/48 | HR 50 | Temp 97.2°F | Resp 16 | Ht 74.0 in | Wt 219.2 lb

## 2019-05-04 DIAGNOSIS — Z0000 Encounter for general adult medical examination without abnormal findings: Secondary | ICD-10-CM | POA: Diagnosis not present

## 2019-05-04 DIAGNOSIS — Z125 Encounter for screening for malignant neoplasm of prostate: Secondary | ICD-10-CM | POA: Diagnosis not present

## 2019-05-04 DIAGNOSIS — D649 Anemia, unspecified: Secondary | ICD-10-CM

## 2019-05-04 DIAGNOSIS — Z23 Encounter for immunization: Secondary | ICD-10-CM

## 2019-05-04 DIAGNOSIS — E7800 Pure hypercholesterolemia, unspecified: Secondary | ICD-10-CM | POA: Diagnosis not present

## 2019-05-04 DIAGNOSIS — E1159 Type 2 diabetes mellitus with other circulatory complications: Secondary | ICD-10-CM | POA: Diagnosis not present

## 2019-05-04 DIAGNOSIS — E1165 Type 2 diabetes mellitus with hyperglycemia: Secondary | ICD-10-CM | POA: Diagnosis not present

## 2019-05-04 LAB — CBC WITH DIFFERENTIAL/PLATELET
Basophils Absolute: 0.1 10*3/uL (ref 0.0–0.1)
Basophils Relative: 0.8 % (ref 0.0–3.0)
Eosinophils Absolute: 0.3 10*3/uL (ref 0.0–0.7)
Eosinophils Relative: 4.9 % (ref 0.0–5.0)
HCT: 38.3 % — ABNORMAL LOW (ref 39.0–52.0)
Hemoglobin: 13 g/dL (ref 13.0–17.0)
Lymphocytes Relative: 27.3 % (ref 12.0–46.0)
Lymphs Abs: 1.9 10*3/uL (ref 0.7–4.0)
MCHC: 33.9 g/dL (ref 30.0–36.0)
MCV: 86.6 fl (ref 78.0–100.0)
Monocytes Absolute: 0.5 10*3/uL (ref 0.1–1.0)
Monocytes Relative: 7.2 % (ref 3.0–12.0)
Neutro Abs: 4.2 10*3/uL (ref 1.4–7.7)
Neutrophils Relative %: 59.8 % (ref 43.0–77.0)
Platelets: 239 10*3/uL (ref 150.0–400.0)
RBC: 4.43 Mil/uL (ref 4.22–5.81)
RDW: 13.8 % (ref 11.5–15.5)
WBC: 7.1 10*3/uL (ref 4.0–10.5)

## 2019-05-04 LAB — COMPREHENSIVE METABOLIC PANEL
ALT: 20 U/L (ref 0–53)
AST: 18 U/L (ref 0–37)
Albumin: 3.9 g/dL (ref 3.5–5.2)
Alkaline Phosphatase: 83 U/L (ref 39–117)
BUN: 6 mg/dL (ref 6–23)
CO2: 35 mEq/L — ABNORMAL HIGH (ref 19–32)
Calcium: 8.7 mg/dL (ref 8.4–10.5)
Chloride: 104 mEq/L (ref 96–112)
Creatinine, Ser: 0.55 mg/dL (ref 0.40–1.50)
GFR: 148.27 mL/min (ref >60.00)
Glucose, Bld: 156 mg/dL — ABNORMAL HIGH (ref 70–99)
Potassium: 3.7 mEq/L (ref 3.5–5.1)
Sodium: 144 mEq/L (ref 135–145)
Total Bilirubin: 0.5 mg/dL (ref 0.2–1.2)
Total Protein: 6 g/dL (ref 6.0–8.3)

## 2019-05-04 LAB — LIPID PANEL
Cholesterol: 78 mg/dL (ref 0–200)
HDL: 35.7 mg/dL — ABNORMAL LOW (ref >39.00)
LDL Cholesterol: 21 mg/dL (ref 0–99)
NonHDL: 42.02
Total CHOL/HDL Ratio: 2
Triglycerides: 104 mg/dL (ref 0.0–149.0)
VLDL: 20.8 mg/dL (ref 0.0–40.0)

## 2019-05-04 LAB — IRON: Iron: 103 ug/dL (ref 42–165)

## 2019-05-04 LAB — PSA: PSA: 0.55 ng/mL (ref 0.10–4.00)

## 2019-05-04 LAB — MAGNESIUM: Magnesium: 1.3 mg/dL — ABNORMAL LOW (ref 1.5–2.5)

## 2019-05-04 LAB — FERRITIN: Ferritin: 54.7 ng/mL (ref 22.0–322.0)

## 2019-05-04 NOTE — Assessment & Plan Note (Addendum)
--  Td  2014 -Pneumonia shot 12-12 - prevnar 2016 - zostavax 2014 -shingrex discussed last year, pt not sure if he had it  --CCS:   Cologuard NEG 01/24/17 -- Prostate ca screening: Previously discussed discontinuing screening, he is only 68 and doing well.  We revisited the issue, agreed to check a PSA.  He is strongly declined DRE. --diet and exercise discussed. --Labs: CMP, FLP, CBC, iron, ferritin, PSA

## 2019-05-04 NOTE — Patient Instructions (Signed)
See you next year!  Keep up the great work!   Randy Castro , Thank you for taking time to come for your Medicare Wellness Visit. I appreciate your ongoing commitment to your health goals. Please review the following plan we discussed and let me know if I can assist you in the future.   These are the goals we discussed: Goals    . Increase physical activity     Exercise daily        This is a list of the screening recommended for you and due dates:  Health Maintenance  Topic Date Due  . Eye exam for diabetics  03/29/2018  . Hemoglobin A1C  10/21/2019  . Cologuard (Stool DNA test)  01/26/2020  . Complete foot exam   03/18/2020  . Tetanus Vaccine  06/01/2023  . Flu Shot  Completed  .  Hepatitis C: One time screening is recommended by Center for Disease Control  (CDC) for  adults born from [birth year] through 1964.   Completed  . Pneumonia vaccines  Completed    Health Maintenance After Age 79 After age 37, you are at a higher risk for certain long-term diseases and infections as well as injuries from falls. Falls are a major cause of broken bones and head injuries in people who are older than age 74. Getting regular preventive care can help to keep you healthy and well. Preventive care includes getting regular testing and making lifestyle changes as recommended by your health care provider. Talk with your health care provider about:  Which screenings and tests you should have. A screening is a test that checks for a disease when you have no symptoms.  A diet and exercise plan that is right for you. What should I know about screenings and tests to prevent falls? Screening and testing are the best ways to find a health problem early. Early diagnosis and treatment give you the best chance of managing medical conditions that are common after age 58. Certain conditions and lifestyle choices may make you more likely to have a fall. Your health care provider may recommend:  Regular vision checks.  Poor vision and conditions such as cataracts can make you more likely to have a fall. If you wear glasses, make sure to get your prescription updated if your vision changes.  Medicine review. Work with your health care provider to regularly review all of the medicines you are taking, including over-the-counter medicines. Ask your health care provider about any side effects that may make you more likely to have a fall. Tell your health care provider if any medicines that you take make you feel dizzy or sleepy.  Osteoporosis screening. Osteoporosis is a condition that causes the bones to get weaker. This can make the bones weak and cause them to break more easily.  Blood pressure screening. Blood pressure changes and medicines to control blood pressure can make you feel dizzy.  Strength and balance checks. Your health care provider may recommend certain tests to check your strength and balance while standing, walking, or changing positions.  Foot health exam. Foot pain and numbness, as well as not wearing proper footwear, can make you more likely to have a fall.  Depression screening. You may be more likely to have a fall if you have a fear of falling, feel emotionally low, or feel unable to do activities that you used to do.  Alcohol use screening. Using too much alcohol can affect your balance and may make you more likely  to have a fall. What actions can I take to lower my risk of falls? General instructions  Talk with your health care provider about your risks for falling. Tell your health care provider if: ? You fall. Be sure to tell your health care provider about all falls, even ones that seem minor. ? You feel dizzy, sleepy, or off-balance.  Take over-the-counter and prescription medicines only as told by your health care provider. These include any supplements.  Eat a healthy diet and maintain a healthy weight. A healthy diet includes low-fat dairy products, low-fat (lean) meats, and  fiber from whole grains, beans, and lots of fruits and vegetables. Home safety  Remove any tripping hazards, such as rugs, cords, and clutter.  Install safety equipment such as grab bars in bathrooms and safety rails on stairs.  Keep rooms and walkways well-lit. Activity   Follow a regular exercise program to stay fit. This will help you maintain your balance. Ask your health care provider what types of exercise are appropriate for you.  If you need a cane or walker, use it as recommended by your health care provider.  Wear supportive shoes that have nonskid soles. Lifestyle  Do not drink alcohol if your health care provider tells you not to drink.  If you drink alcohol, limit how much you have: ? 0-1 drink a day for women. ? 0-2 drinks a day for men.  Be aware of how much alcohol is in your drink. In the [place] one drink equals one typical bottle of beer (12 oz), one-half glass of wine (5 oz), or one shot of hard liquor (1 oz).  Do not use any products that contain nicotine or tobacco, such as cigarettes and e-cigarettes. If you need help quitting, ask your health care provider. Summary  Having a healthy lifestyle and getting preventive care can help to protect your health and wellness after age 69.  Screening and testing are the best way to find a health problem early and help you avoid having a fall. Early diagnosis and treatment give you the best chance for managing medical conditions that are more common for people who are older than age 8.  Falls are a major cause of broken bones and head injuries in people who are older than age 50. Take precautions to prevent a fall at home.  Work with your health care provider to learn what changes you can make to improve your health and wellness and to prevent falls. This information is not intended to replace advice given to you by your health care provider. Make sure you discuss any questions you have with your health care provider.  Document Released: 06/23/2017 Document Revised: 12/01/2018 Document Reviewed: 06/23/2017 Elsevier Patient Education  2020 [employer].

## 2019-05-04 NOTE — Progress Notes (Signed)
Subjective:    Patient ID: Randy Foots., male    DOB: April 08, 1951, 68 y.o.   MRN: [id number]  DOS:  05/04/2019 Type of visit - description: Complete physical exam In general feeling well, since the last visit had his gallbladder out all GI sxs are essentially gone. He specifically denies chest pain, difficulty breathing, cough.  BP Readings from Last 3 Encounters:  05/04/19 (!) 139/48  05/04/19 (!) 139/48  11/17/18 132/80     Review of Systems Other than above, a 14 point review of systems is negative    Past Medical History:  Diagnosis Date  . Arthritis   . CAD (coronary artery disease)    a. NSTEMI 12/12: EF 40-45%. GUY:[id number] balloon PTCA + Promus DES x 1 to mid LAD;  b. 11/2012: Cath with Cutting balloon PTCA  LAD for ISR to LAD, EF 55%;  c. 12/2012 Cath/PCI: LAD stents patent, 80 ost Diag (jailed)->PTCA, LCX/RCA patent.  . Essential hypertension   . Hyperlipidemia   . Ischemic cardiomyopathy    a.  echo 08/06/11: dist ant wall, apical and septal and infero-apical HK, mild LVH, EF 40%, mild LAE, PASP 34, asc aorta mildly dilated, mild TR;  b.  Echo (3/13) showed recovery of LV systolic function with EF 59-56%, grade I diastolic dysfunction, mild MR.   . Type 2 diabetes mellitus ([place])     Past Surgical History:  Procedure Laterality Date  . ANTERIOR CERVICAL DECOMP/DISCECTOMY FUSION  in the 90s  . CHOLECYSTECTOMY  03/27/2019  . CORONARY ANGIOPLASTY  12/15/2012; 12/22/2012  . CORONARY ANGIOPLASTY WITH STENT PLACEMENT  07/2011   "1" (12/22/2012)  . GASTRIC BYPASS  12/21/2017  . KNEE ARTHROSCOPY Left 12/31/2016  . LEFT HEART CATHETERIZATION WITH CORONARY ANGIOGRAM N/A 08/06/2011   Procedure: LEFT HEART CATHETERIZATION WITH CORONARY ANGIOGRAM;  Surgeon: Burnell Blanks, MD;  Location: [facility] CATH LAB;  Service: Cardiovascular;  Laterality: N/A;  . LEFT HEART CATHETERIZATION WITH CORONARY ANGIOGRAM N/A 12/15/2012   Procedure: LEFT HEART CATHETERIZATION WITH CORONARY  ANGIOGRAM;  Surgeon: Sherren Mocha, MD;  Location: [facility] CATH LAB;  Service: Cardiovascular;  Laterality: N/A;  . LEFT HEART CATHETERIZATION WITH CORONARY ANGIOGRAM N/A 12/22/2012   Procedure: LEFT HEART CATHETERIZATION WITH CORONARY ANGIOGRAM;  Surgeon: Sherren Mocha, MD;  Location: [facility] CATH LAB;  Service: Cardiovascular;  Laterality: N/A;  . PERCUTANEOUS CORONARY STENT INTERVENTION (PCI-S) Right 12/15/2012   Procedure: PERCUTANEOUS CORONARY STENT INTERVENTION (PCI-S);  Surgeon: Sherren Mocha, MD;  Location: [facility] CATH LAB;  Service: Cardiovascular;  Laterality: Right;    Social History   Socioeconomic History  . Marital status: Married    Spouse name: Not on file  . Number of children: 2   . Years of education: Not on file  . Highest education level: Not on file  Occupational History  . Occupation: Audiological scientist  Social Needs  . Financial resource strain: Not on file  . Food insecurity    Worry: Not on file    Inability: Not on file  . Transportation needs    Medical: Not on file    Non-medical: Not on file  Tobacco Use  . Smoking status: Former Smoker    Packs/day: 1.00    Years: 25.00    Pack years: 25.00    Types: Cigarettes    Quit date: 04/12/1992    Years since quitting: 27.0  . Smokeless tobacco: Never Used  Substance and Sexual Activity  . Alcohol use: Yes    Comment: 12/22/2012 "rarely maybe every  3-4 months, beer"  . Drug use: No  . Sexual activity: Yes  Lifestyle  . Physical activity    Days per week: Not on file    Minutes per session: Not on file  . Stress: Not on file  Relationships  . Social Herbalist on phone: Not on file    Gets together: Not on file    Attends religious service: Not on file    Active member of club or organization: Not on file    Attends meetings of clubs or organizations: Not on file    Relationship status: Not on file  . Intimate partner violence    Fear of current or ex partner: Not on file    Emotionally  abused: Not on file    Physically abused: Not on file    Forced sexual activity: Not on file  Other Topics Concern  . Not on file  Social History Narrative   Moved back to [place] from [place]-- pt and wife     Family History  Problem Relation Age of Onset  . Diabetes Brother   . Lung cancer Brother   . Diabetes Mother   . Coronary artery disease Father   . Heart attack Father 63  . Diabetes Sister   . Coronary artery disease Sister   . Colon cancer Neg Hx   . Prostate cancer Neg Hx   . Stroke Neg Hx      Allergies as of 05/04/2019      Reactions   Trulicity [dulaglutide] Nausea And Vomiting      Medication List       Accurate as of May 04, 2019 11:59 PM. If you have any questions, ask your nurse or doctor.        aspirin EC 81 MG tablet Take 81 mg by mouth daily.   atorvastatin 80 MG tablet Commonly known as: LIPITOR Take 1 tablet (80 mg total) by mouth at bedtime.   azelastine 0.1 % nasal spray Commonly known as: ASTELIN Place 1-2 sprays into both nostrils 2 (two) times daily. Use in each nostril as directed   carvedilol 12.5 MG tablet Commonly known as: COREG Take 1 tablet (12.5 mg total) by mouth 2 (two) times daily with a meal.   citalopram 20 MG tablet Commonly known as: CELEXA TAKE 1 TABLET(20 MG) BY MOUTH DAILY   clopidogrel 75 MG tablet Commonly known as: PLAVIX Take 1 tablet (75 mg total) by mouth daily.   clotrimazole-betamethasone cream Commonly known as: LOTRISONE Apply topically 2 (two) times daily. What changed:   when to take this  reasons to take this   dicyclomine 10 MG capsule Commonly known as: BENTYL TAKE 1 TO 2 CAPSULES(10 TO 20 MG) BY MOUTH THREE TIMES DAILY AS NEEDED FOR SPASMS   fluticasone 50 MCG/ACT nasal spray Commonly known as: FLONASE Place 2 sprays into both nostrils daily.   glucose blood test strip Check blood sugar 2-3 times daily   glucose monitoring kit monitoring kit 1 each by Does not  apply route as needed for other (Check sugar levels 3 times a day).   magnesium oxide 400 (241.3 Mg) MG tablet Commonly known as: MAGnesium-Oxide Take 1 tablet (400 mg total) by mouth 2 (two) times daily.   nitroGLYCERIN 0.4 MG SL tablet Commonly known as: NITROSTAT Place 1 tablet (0.4 mg total) under the tongue every 5 (five) minutes as needed for chest pain.   NovoLIN N 100 UNIT/ML injection  Generic drug: insulin NPH Human Inject into the skin. 25 units in morning, 30 units in evening   omeprazole 20 MG capsule Commonly known as: PRILOSEC Take 1 tablet by mouth twice a day.   ondansetron 4 MG tablet Commonly known as: ZOFRAN TAKE 1 TABLET(4 MG) BY MOUTH EVERY 8 HOURS AS NEEDED FOR NAUSEA OR VOMITING   Potassium 99 MG Tabs Take 1 tablet by mouth daily.   primidone 50 MG tablet Commonly known as: MYSOLINE Take 1 tablet (50 mg total) by mouth at bedtime.   ursodiol 300 MG capsule Commonly known as: ACTIGALL Take 300 mg by mouth 2 (two) times daily.           Objective:   Physical Exam BP (!) 139/48 (BP Location: Left Arm, Patient Position: Sitting, Cuff Size: Normal)   Pulse (!) 50   Temp (!) 97.2 F (36.2 C) (Temporal)   Resp 16   Ht _0  (1.88 m)   Wt 219 lb 4 oz (99.5 kg)   SpO2 99%   BMI 28.15 kg/m  General: Well developed, NAD, BMI noted Neck: No  thyromegaly  HEENT:  Normocephalic . Face symmetric, atraumatic Lungs:  CTA B Normal respiratory effort, no intercostal retractions, no accessory muscle use. Heart: RRR,  no murmur.  No pretibial edema bilaterally  Abdomen:  Not distended, soft, non-tender. No rebound or rigidity.   Skin: Exposed areas without rash. Not pale. Not jaundice Neurologic:  alert & oriented X3.  Speech normal, gait appropriate for age and unassisted Strength symmetric and appropriate for age.  Psych: Cognition and judgment appear intact.  Cooperative with normal attention span and concentration.  Behavior appropriate. No  anxious or depressed appearing.     Assessment      Assessment   DM -- insulin dependent, uncontrolled, with complications, CAD CHF.  Endo: [place] HTN Hyperlipidemia CAD, CHF, ischemic cardiomyopathy, PVCs (Holter 2019 show 13.6% burden) NSTEMI in 12/12, unstable angina in 4/14 and 5/14. He had DES to mid LAD in 12/12. EF was 40% with apical hypokinesis at the time of the MI. Repeat echo in 3/13 showed EF 55-60%. In 4/14, he was admitted with unstable angina and had 90% pLAD in-stent restenosis. This was treated with cutting balloon angioplasty. Readmitted, again with unstable angina, in 5/14. This time he had PTCA to 90% ostial D1 stenosis.  OSA dx ~08-2017, om Cpap Anxiety-depression Essential Tremor dx 2019  Skin cancer: Right leg, status post excision 2016; Dr Allyson Sabal Morbid obesity:gastric sleeve, 11-2017  PLAN: DM: Follow-up by endocrinology, currently on insulin, A1c 8.2 on August 2020, improved compared to before. HTN: BP today 139/48, continue carvedilol, recommend ambulatory BPs Hyperlipidemia: On Lipitor, checking labs Low Mg: check labs  CAD, CHF, cardiomyopathy: Seems to be doing well.  Last cardiology visit 10-2018 Depression: On citalopram, doing well Morbid obesity: Status post gastric sleeve 2019, doing well. RTC 6 to 8 months

## 2019-05-04 NOTE — Progress Notes (Signed)
Pre visit review using our clinic review tool, if applicable. No additional management support is needed unless otherwise documented below in the visit note. 

## 2019-05-04 NOTE — Patient Instructions (Addendum)
Per our records you are due for an eye exam. Please contact your eye doctor to schedule an appointment. Please have them send copies of your office visit notes to [place]. Our fax number is [phone].  GO TO THE LAB : Get the blood work     GO TO THE FRONT DESK Schedule your next appointment for a checkup in 6 to 8 months   Check the  blood pressure 2 or 3 times a month BP GOAL is between 110/65 and  135/85. If it is consistently higher or lower, let me know  Please check your records and let me know when and where you have the shingles vaccination called [facility]

## 2019-05-05 NOTE — Assessment & Plan Note (Signed)
DM: Follow-up by endocrinology, currently on insulin, A1c 8.2 on August 2020, improved compared to before. HTN: BP today 139/48, continue carvedilol, recommend ambulatory BPs Hyperlipidemia: On Lipitor, checking labs Low Mg: check labs  CAD, CHF, cardiomyopathy: Seems to be doing well.  Last cardiology visit 10-2018 Depression: On citalopram, doing well Morbid obesity: Status post gastric sleeve 2019, doing well. RTC 6 to 8 months

## 2019-06-08 ENCOUNTER — Other Ambulatory Visit: Payer: Self-pay | Admitting: Internal Medicine

## 2019-07-06 ENCOUNTER — Telehealth: Payer: Self-pay | Admitting: Cardiology

## 2019-07-06 NOTE — Telephone Encounter (Signed)
Virtual Visit Pre-Appointment Phone Call  "(Name), I am calling you today to discuss your upcoming appointment. We are currently trying to limit exposure to the virus that causes COVID-19 by seeing patients at home rather than in the office."  1. "What is the BEST phone number to call the day of the visit?" - include this in appointment notes  2. Do you have or have access to (through a family member/friend) a smartphone with video capability that we can use for your visit?" a. If yes - list this number in appt notes as cell (if different from BEST phone #) and list the appointment type as a VIDEO visit in appointment notes b. If no - list the appointment type as a PHONE visit in appointment notes  3. Confirm consent - "In the setting of the current Covid19 crisis, you are scheduled for a (phone or video) visit with your provider on (date) at (time).  Just as we do with many in-office visits, in order for you to participate in this visit, we must obtain consent.  If you'd like, I can send this to your mychart (if signed up) or email for you to review.  Otherwise, I can obtain your verbal consent now.  All virtual visits are billed to your insurance company just like a normal visit would be.  By agreeing to a virtual visit, we'd like you to understand that the technology does not allow for your provider to perform an examination, and thus may limit your provider's ability to fully assess your condition. If your provider identifies any concerns that need to be evaluated in person, we will make arrangements to do so.  Finally, though the technology is pretty good, we cannot assure that it will always work on either your or our end, and in the setting of a video visit, we may have to convert it to a phone-only visit.  In either situation, we cannot ensure that we have a secure connection.  Are you willing to proceed?" STAFF: Did the patient verbally acknowledge consent to telehealth visit? Document  YES/NO here: yes  4. Advise patient to be prepared - "Two hours prior to your appointment, go ahead and check your blood pressure, pulse, oxygen saturation, and your weight (if you have the equipment to check those) and write them all down. When your visit starts, your provider will ask you for this information. If you have an Apple Watch or Kardia device, please plan to have heart rate information ready on the day of your appointment. Please have a pen and paper handy nearby the day of the visit as well."  5. Give patient instructions for MyChart download to smartphone OR [email or web address] as below if video visit (depending on what platform provider is using)  6. Inform patient they will receive a phone call 15 minutes prior to their appointment time (may be from unknown caller ID) so they should be prepared to answer    [place]. has been deemed a candidate for a follow-up tele-health visit to limit community exposure during the Covid-19 pandemic. I spoke with the patient via phone to ensure availability of phone/video source, confirm preferred email & phone number, and discuss instructions and expectations.  I reminded [name] to be prepared with any vital sign and/or heart rhythm information that could potentially be obtained via home monitoring, at the time of his visit. I reminded [name] to expect a phone  call prior to his visit.  Weston Anna 07/06/2019 3:18 PM   INSTRUCTIONS FOR DOWNLOADING THE MYCHART APP TO SMARTPHONE  - The patient must first make sure to have activated MyChart and know their login information - If Apple, go to [employer] and type in MyChart in the search bar and download the app. If Android, ask patient to go to [employer] and type in [place] in the search bar and download the app. The app is free but as with any other app downloads, their phone may require them to verify saved payment information or  Apple/Android password.  - The patient will need to then log into the app with their MyChart username and password, and select [facility] as their healthcare provider to link the account. When it is time for your visit, go to the MyChart app, find appointments, and click Begin Video Visit. Be sure to Select Allow for your device to access the Microphone and Camera for your visit. You will then be connected, and your provider will be with you shortly.  **If they have any issues connecting, or need assistance please contact MyChart service desk [phone])**  **If using a computer, in order to ensure the best quality for their visit they will need to use either of the following Internet Browsers: [employer], or Google Chrome**  IF USING DOXIMITY or DOXY.ME - The patient will receive a link just prior to their visit by text.     FULL LENGTH CONSENT FOR TELE-HEALTH VISIT   I hereby voluntarily request, consent and authorize [place] and its employed or contracted physicians, physician assistants, nurse practitioners or other licensed health care professionals (the Practitioner), to provide me with telemedicine health care services (the Services") as deemed necessary by the treating Practitioner. I acknowledge and consent to receive the Services by the Practitioner via telemedicine. I understand that the telemedicine visit will involve communicating with the Practitioner through live audiovisual communication technology and the disclosure of certain medical information by electronic transmission. I acknowledge that I have been given the opportunity to request an in-person assessment or other available alternative prior to the telemedicine visit and am voluntarily participating in the telemedicine visit.  I understand that I have the right to withhold or withdraw my consent to the use of telemedicine in the course of my care at any time, without affecting my right to future care  or treatment, and that the Practitioner or I may terminate the telemedicine visit at any time. I understand that I have the right to inspect all information obtained and/or recorded in the course of the telemedicine visit and may receive copies of available information for a reasonable fee.  I understand that some of the potential risks of receiving the Services via telemedicine include:   Delay or interruption in medical evaluation due to technological equipment failure or disruption;  Information transmitted may not be sufficient (e.g. poor resolution of images) to allow for appropriate medical decision making by the Practitioner; and/or   In rare instances, security protocols could fail, causing a breach of personal health information.  Furthermore, I acknowledge that it is my responsibility to provide information about my medical history, conditions and care that is complete and accurate to the best of my ability. I acknowledge that Practitioner's advice, recommendations, and/or decision may be based on factors not within their control, such as incomplete or inaccurate data provided by me or distortions of diagnostic images or specimens that may result from  electronic transmissions. I understand that the practice of medicine is not an exact science and that Practitioner makes no warranties or guarantees regarding treatment outcomes. I acknowledge that I will receive a copy of this consent concurrently upon execution via email to the email address I last provided but may also request a printed copy by calling the office of [facility] [place].    I understand that my insurance will be billed for this visit.   I have read or had this consent read to me.  I understand the contents of this consent, which adequately explains the benefits and risks of the Services being provided via telemedicine.   I have been provided ample opportunity to ask questions regarding this consent and the Services and have had  my questions answered to my satisfaction.  I give my informed consent for the services to be provided through the use of telemedicine in my medical care  By participating in this telemedicine visit I agree to the above.

## 2019-07-11 ENCOUNTER — Telehealth: Payer: PPO | Admitting: Cardiology

## 2019-07-12 ENCOUNTER — Telehealth (INDEPENDENT_AMBULATORY_CARE_PROVIDER_SITE_OTHER): Payer: PPO | Admitting: Cardiology

## 2019-07-12 ENCOUNTER — Encounter: Payer: Self-pay | Admitting: Cardiology

## 2019-07-12 DIAGNOSIS — E782 Mixed hyperlipidemia: Secondary | ICD-10-CM

## 2019-07-12 DIAGNOSIS — I493 Ventricular premature depolarization: Secondary | ICD-10-CM

## 2019-07-12 DIAGNOSIS — I429 Cardiomyopathy, unspecified: Secondary | ICD-10-CM | POA: Diagnosis not present

## 2019-07-12 DIAGNOSIS — I25119 Atherosclerotic heart disease of native coronary artery with unspecified angina pectoris: Secondary | ICD-10-CM | POA: Diagnosis not present

## 2019-07-12 NOTE — Progress Notes (Signed)
Virtual Visit via Telephone Note   This visit type was conducted due to national recommendations for restrictions regarding the COVID-19 Pandemic (e.g. social distancing) in an effort to limit this patient's exposure and mitigate transmission in our community.  Due to his co-morbid illnesses, this patient is at least at moderate risk for complications without adequate follow up.  This format is felt to be most appropriate for this patient at this time.  The patient did not have access to video technology/had technical difficulties with video requiring transitioning to audio format only (telephone).  All issues noted in this document were discussed and addressed.  No physical exam could be performed with this format.  Please refer to the patient's chart for his  consent to telehealth for [facility].   Date:  07/12/2019   ID:  Randy Foots., DOB October 25, 1950, MRN [id number]  Patient Location: Home Provider Location: Office  PCP:  Colon Branch, MD  Cardiologist:  Rozann Lesches, MD Electrophysiologist:  None   Evaluation Performed:  Follow-Up Visit  Chief Complaint:   Cardiac follow-up  History of Present Illness:    Randy Messex. is a 68 y.o. male last seen in March.  We spoke by phone today.  He tells me that he has been doing very well overall.  No obvious angina symptoms nitroglycerin use.  He remains functional with ADLs.  Describes NYHA class II dyspnea, no sense of palpitations or syncope.  I reviewed his medications.  Current cardiac regimen includes aspirin, Plavix, Lipitor, Coreg, lisinopril, and as needed nitroglycerin.  He had lab work done in September which I reviewed.  LDL was only 21 at that time.  He reports no side effects on high-dose Lipitor.  Echocardiogram done in follow-up back in June of this year showed LVEF approximately 45% with diffuse LV hypokinesis.  The patient does not have symptoms concerning for COVID-19 infection (fever, chills, cough, or new  shortness of breath).    Past Medical History:  Diagnosis Date  . Arthritis   . CAD (coronary artery disease)    a. NSTEMI 12/12: EF 40-45%. [id number] balloon PTCA + Promus DES x 1 to mid LAD;  b. 11/2012: Cath with Cutting balloon PTCA  LAD for ISR to LAD, EF 55%;  c. 12/2012 Cath/PCI: LAD stents patent, 80 ost Diag (jailed)->PTCA, LCX/RCA patent.  . Essential hypertension   . Hyperlipidemia   . Ischemic cardiomyopathy    a.  echo 08/06/11: dist ant wall, apical and septal and infero-apical HK, mild LVH, EF 40%, mild LAE, PASP 34, asc aorta mildly dilated, mild TR;  b.  Echo (3/13) showed recovery of LV systolic function with EF 0000000, grade I diastolic dysfunction, mild MR.   . Type 2 diabetes mellitus ([place])    Past Surgical History:  Procedure Laterality Date  . ANTERIOR CERVICAL DECOMP/DISCECTOMY FUSION  in the 90s  . CHOLECYSTECTOMY  03/27/2019  . CORONARY ANGIOPLASTY  12/15/2012; 12/22/2012  . CORONARY ANGIOPLASTY WITH STENT PLACEMENT  07/2011   "1" (12/22/2012)  . GASTRIC BYPASS  12/21/2017  . KNEE ARTHROSCOPY Left 12/31/2016  . LEFT HEART CATHETERIZATION WITH CORONARY ANGIOGRAM N/A 08/06/2011   Procedure: LEFT HEART CATHETERIZATION WITH CORONARY ANGIOGRAM;  Surgeon: Burnell Blanks, MD;  Location: [facility] CATH LAB;  Service: Cardiovascular;  Laterality: N/A;  . LEFT HEART CATHETERIZATION WITH CORONARY ANGIOGRAM N/A 12/15/2012   Procedure: LEFT HEART CATHETERIZATION WITH CORONARY ANGIOGRAM;  Surgeon: Sherren Mocha, MD;  Location: [facility] CATH LAB;  Service: Cardiovascular;  Laterality: N/A;  . LEFT HEART CATHETERIZATION WITH CORONARY ANGIOGRAM N/A 12/22/2012   Procedure: LEFT HEART CATHETERIZATION WITH CORONARY ANGIOGRAM;  Surgeon: Sherren Mocha, MD;  Location: [facility] CATH LAB;  Service: Cardiovascular;  Laterality: N/A;  . PERCUTANEOUS CORONARY STENT INTERVENTION (PCI-S) Right 12/15/2012   Procedure: PERCUTANEOUS CORONARY STENT INTERVENTION (PCI-S);  Surgeon: Sherren Mocha, MD;  Location:  [facility] CATH LAB;  Service: Cardiovascular;  Laterality: Right;     Current Meds  Medication Sig  . aspirin EC 81 MG tablet Take 81 mg by mouth daily.  Marland Kitchen atorvastatin (LIPITOR) 80 MG tablet Take 1 tablet (80 mg total) by mouth at bedtime.  Marland Kitchen azelastine (ASTELIN) 0.1 % nasal spray Place 1-2 sprays into both nostrils 2 (two) times daily. Use in each nostril as directed  . carvedilol (COREG) 12.5 MG tablet Take 1 tablet (12.5 mg total) by mouth 2 (two) times daily with a meal.  . citalopram (CELEXA) 20 MG tablet TAKE 1 TABLET(20 MG) BY MOUTH DAILY  . clopidogrel (PLAVIX) 75 MG tablet Take 1 tablet (75 mg total) by mouth daily.  . clotrimazole-betamethasone (LOTRISONE) cream Apply topically 2 (two) times daily. (Patient taking differently: Apply topically 2 (two) times daily as needed. )  . dicyclomine (BENTYL) 10 MG capsule TAKE 1 TO 2 CAPSULES(10 TO 20 MG) BY MOUTH THREE TIMES DAILY AS NEEDED FOR SPASMS  . fluticasone (FLONASE) 50 MCG/ACT nasal spray Place 2 sprays into both nostrils daily.  . insulin NPH Human (NOVOLIN N) 100 UNIT/ML injection Inject into the skin. 25 units in morning, 30 units in evening  . lisinopril (ZESTRIL) 5 MG tablet Take 5 mg by mouth daily.  . magnesium oxide (MAGNESIUM-OXIDE) 400 (241.3 Mg) MG tablet Take 1 tablet (400 mg total) by mouth 2 (two) times daily.  . metFORMIN (GLUCOPHAGE) 1000 MG tablet Take 1,000 mg by mouth 2 (two) times daily.  . nitroGLYCERIN (NITROSTAT) 0.4 MG SL tablet Place 1 tablet (0.4 mg total) under the tongue every 5 (five) minutes as needed for chest pain.  Marland Kitchen omeprazole (PRILOSEC) 20 MG capsule Take 1 tablet by mouth twice a day.  . ondansetron (ZOFRAN) 4 MG tablet TAKE 1 TABLET(4 MG) BY MOUTH EVERY 8 HOURS AS NEEDED FOR NAUSEA OR VOMITING  . Potassium 99 MG TABS Take 1 tablet by mouth daily.  . primidone (MYSOLINE) 50 MG tablet Take 1 tablet (50 mg total) by mouth at bedtime.  . ursodiol (ACTIGALL) 300 MG capsule Take 300 mg by mouth 2 (two) times  daily.     Allergies:   Trulicity [dulaglutide]   Social History   Tobacco Use  . Smoking status: Former Smoker    Packs/day: 1.00    Years: 25.00    Pack years: 25.00    Types: Cigarettes    Quit date: 04/12/1992    Years since quitting: 27.2  . Smokeless tobacco: Never Used  Substance Use Topics  . Alcohol use: Yes    Comment: 12/22/2012 "rarely maybe every 3-4 months, beer"  . Drug use: No     Family Hx: The patient's family history includes Coronary artery disease in his father and sister; Diabetes in his brother, mother, and sister; Heart attack (age of onset: 32) in his father; Lung cancer in his brother. There is no history of Colon cancer, Prostate cancer, or Stroke.  ROS:   Please see the history of present illness. All other systems reviewed and are negative.   Prior CV studies:   The following studies were reviewed today:  Holter monitor 11/18/2017: 24-hour Holter monitor reviewed. Sinus rhythm is present throughout. Heart rate ranged from 49 bpm up to 106 bpm with average heart rate 68 bpm. 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 PVC volume represented 13.6% of total beats.  Lexiscan Myoview 12/15/2017:  There was no ST segment deviation noted during stress.  Findings consistent with larege prior inferior myocardial infarction.  This is a high risk study. Risked based on decreased LVEF. There is no current myocardium at jeopardy. Consider correlating LVEF with echocardiogram.  The left ventricular ejection fraction is moderately decreased (30-44%).  Echocardiogram 01/25/2019:  1. The left ventricle has a visually estimated ejection fraction of approximately 45%. The cavity size was mildly dilated. Left ventricular diastolic Doppler parameters are consistent with impaired relaxation. Left ventricular diffuse hypokinesis.  2. The right ventricle has normal systolic function. The cavity was normal. There is no increase in  right ventricular wall thickness.  3. The aortic valve is tricuspid. Mild aortic annular calcification noted.  4. The mitral valve is grossly normal.  5. The tricuspid valve is grossly normal.  6. There is mild dilatation of the aortic root.  7. Cannot exclude small PFO.  8. There is redundancy of the interatrial septum.  Labs/Other Tests and Data Reviewed:    EKG:  An ECG dated 11/14/2017 was personally reviewed today and demonstrated:  Sinus rhythm with frequent PVCs.  Recent Labs: 05/04/2019: ALT 20; BUN 6; Creatinine, Ser 0.55; Hemoglobin 13.0; Magnesium 1.3; Platelets 239.0; Potassium 3.7; Sodium 144   Recent Lipid Panel Lab Results  Component Value Date/Time   CHOL 78 05/04/2019 09:52 AM   TRIG 104.0 05/04/2019 09:52 AM   HDL 35.70 (L) 05/04/2019 09:52 AM   CHOLHDL 2 05/04/2019 09:52 AM   LDLCALC 21 05/04/2019 09:52 AM   LDLDIRECT 145.0 04/26/2018 10:26 AM    Wt Readings from Last 3 Encounters:  07/12/19 209 lb (94.8 kg)  05/04/19 219 lb (99.3 kg)  05/04/19 219 lb 4 oz (99.5 kg)     Objective:    Vital Signs:  BP (!) 145/69   Ht 6\' 2"  (1.88 m)   Wt 209 lb (94.8 kg)   BMI 26.83 kg/m    Patient spoke in full sentences, not short of breath. No audible wheezing or coughing.  Speech pattern normal.  ASSESSMENT & PLAN:    1.  CAD status post previous LAD interventions most recently including cutting balloon for in-stent restenosis in 2014 and subsequent angioplasty of a jailed diagonal branch.  We are managing him medically in the absence of recurring chest pain.  Medications reviewed, no changes made today.  2.  Secondary cardiomyopathy, LVEF approximately 45% by echocardiogram in June.  Continue Coreg and lisinopril.  3.  Frequent PVCs by history.  He reports no sense of palpitations or syncope.  He remains on Coreg.  Question whether this could be contributing to his cardiomyopathy.  Might consider further quantification at next visit depending on ECG at that time.   4.  Mixed hyperlipidemia, LDL aggressively controlled on high-dose Lipitor.  COVID-19 Education: The signs and symptoms of COVID-19 were discussed with the patient and how to seek care for testing (follow up with PCP or arrange E-visit).  The importance of social distancing was discussed today.  Time:   Today, I have spent 6 minutes with the patient with telehealth technology discussing the above problems.     Medication Adjustments/Labs and Tests Ordered: Current medicines are reviewed at length  with the patient today.  Concerns regarding medicines are outlined above.   Tests Ordered: No orders of the defined types were placed in this encounter.   Medication Changes: No orders of the defined types were placed in this encounter.   Follow Up:  In Person 6 months in the [place] office.  Signed, Rozann Lesches, MD  07/12/2019 3:29 PM    [place]

## 2019-07-12 NOTE — Patient Instructions (Addendum)

## 2019-07-17 NOTE — Progress Notes (Signed)
 Virtual Visit via Video Note The purpose of this virtual visit is to provide medical care while limiting exposure to the novel coronavirus.    Consent was obtained for video visit:  Yes.   Answered questions that patient had about telehealth interaction:  Yes.   I discussed the limitations, risks, security and privacy concerns of performing an evaluation and management service by telemedicine. I also discussed with the patient that there may be a patient responsible charge related to this service. The patient expressed understanding and agreed to proceed.  Pt location: home Physician Location: office Name of referring provider:  Paz, Jose E, MD I connected with Randy S Zawislak Jr. at patients initiation/request on 07/24/2019 at  8:45 AM EST by video enabled telemedicine application and verified that I am speaking with the correct person using two identifiers. Pt MRN:  [id number] Pt DOB:  06/27/1951 Video Participants:  Randy S Dowdell Jr.;     History of Present Illness:  Pt seen in f/u.  Last visit, I increased the patient's primidone, so that he is now taking 50 mg twice per day.  He reports that he is doing well with this.  He occasionally has tremor.  He denies any side effects with the medication.  Medical records have been reviewed since our last visit, including his visit with primary care in September.   Current Outpatient Medications on File Prior to Visit  Medication Sig Dispense Refill  . aspirin EC 81 MG tablet Take 81 mg by mouth daily.    . atorvastatin (LIPITOR) 80 MG tablet Take 1 tablet (80 mg total) by mouth at bedtime. 90 tablet 1  . azelastine (ASTELIN) 0.1 % nasal spray Place 1-2 sprays into both nostrils 2 (two) times daily. Use in each nostril as directed 30 mL 5  . carvedilol (COREG) 12.5 MG tablet Take 1 tablet (12.5 mg total) by mouth 2 (two) times daily with a meal. 180 tablet 1  . citalopram (CELEXA) 20 MG tablet TAKE 1 TABLET(20 MG) BY MOUTH DAILY 90 tablet 1  .  clopidogrel (PLAVIX) 75 MG tablet Take 1 tablet (75 mg total) by mouth daily. 90 tablet 2  . clotrimazole-betamethasone (LOTRISONE) cream Apply topically 2 (two) times daily. (Patient taking differently: Apply topically 2 (two) times daily as needed. ) 30 g 0  . dicyclomine (BENTYL) 10 MG capsule TAKE 1 TO 2 CAPSULES(10 TO 20 MG) BY MOUTH THREE TIMES DAILY AS NEEDED FOR SPASMS 30 capsule 0  . fluticasone (FLONASE) 50 MCG/ACT nasal spray Place 2 sprays into both nostrils daily. 16 g 1  . glucose blood test strip Check blood sugar 2-3 times daily 100 each 12  . glucose monitoring kit (FREESTYLE) monitoring kit 1 each by Does not apply route as needed for other (Check sugar levels 3 times a day). 1 each 0  . insulin NPH Human (NOVOLIN N) 100 UNIT/ML injection Inject into the skin. 25 units in morning, 30 units in evening    . lisinopril (ZESTRIL) 5 MG tablet Take 5 mg by mouth daily.    . magnesium oxide (MAGNESIUM-OXIDE) 400 (241.3 Mg) MG tablet Take 1 tablet (400 mg total) by mouth 2 (two) times daily. 180 tablet 3  . metFORMIN (GLUCOPHAGE) 1000 MG tablet Take 1,000 mg by mouth 2 (two) times daily.    . nitroGLYCERIN (NITROSTAT) 0.4 MG SL tablet Place 1 tablet (0.4 mg total) under the tongue every 5 (five) minutes as needed for chest pain. 100 tablet 3  .   omeprazole (PRILOSEC) 20 MG capsule Take 1 tablet by mouth twice a day.    . ondansetron (ZOFRAN) 4 MG tablet TAKE 1 TABLET(4 MG) BY MOUTH EVERY 8 HOURS AS NEEDED FOR NAUSEA OR VOMITING 20 tablet 0  . Potassium 99 MG TABS Take 1 tablet by mouth daily.    . ursodiol (ACTIGALL) 300 MG capsule Take 300 mg by mouth 2 (two) times daily.     No current facility-administered medications on file prior to visit.      Observations/Objective:   Vitals:   07/24/19 0809  BP: 140/70  Weight: 209 lb (94.8 kg)   GEN:  The patient appears stated age and is in NAD.  Neurological examination:  Orientation: The patient is alert and oriented x3. Cranial  nerves: There is good facial symmetry. There is no facial hypomimia.  The speech is fluent and clear. Soft palate rises symmetrically and there is no tongue deviation. Hearing is intact to conversational tone. Motor: Strength is at least antigravity x 4.   Shoulder shrug is equal and symmetric.  There is no pronator drift.  Movement examination: Tone: unable Abnormal movements: none seen.  No intention tremor.  No postural tremor. Coordination:  There is no decremation with RAM's, with any form of RAMS, including alternating supination and pronation of the forearm, hand opening and closing, finger taps, heel taps and toe taps. Gait and Station: The patient ambulates well in his home.    Chemistry      Component Value Date/Time   NA 144 05/04/2019 0952   K 3.7 05/04/2019 0952   CL 104 05/04/2019 0952   CO2 35 (H) 05/04/2019 0952   BUN 6 05/04/2019 0952   CREATININE 0.55 05/04/2019 0952   CREATININE 1.20 06/15/2016 0849      Component Value Date/Time   CALCIUM 8.7 05/04/2019 0952   ALKPHOS 83 05/04/2019 0952   AST 18 05/04/2019 0952   ALT 20 05/04/2019 0952   BILITOT 0.5 05/04/2019 0952         Assessment and Plan:   1.  Essential Tremor.             -Continue primidone, 50 mg twice per day.  Prescription was refilled today.  2.  Uncontrolled diabetes             -seeing endocrinology.    -s/p gastric bypass 1 year ago  3.  Obstructive sleep apnea syndrome             -On CPAP faithfully.  Discussed morbidity and mortality associate with untreated sleep apnea.  Follow Up Instructions:  9 months  -I discussed the assessment and treatment plan with the patient. The patient was provided an opportunity to ask questions and all were answered. The patient agreed with the plan and demonstrated an understanding of the instructions.   The patient was advised to call back or seek an in-person evaluation if the symptoms worsen or if the condition fails to improve as anticipated.     Total Time spent in visit with the patient was:  15 min, of which more than 50% of the time was spent in counseling as above.   Pt understands and agrees with the plan of care outlined.     Rebecca [name], DO 

## 2019-07-19 LAB — HM DIABETES EYE EXAM

## 2019-07-24 ENCOUNTER — Encounter: Payer: Self-pay | Admitting: Neurology

## 2019-07-24 ENCOUNTER — Other Ambulatory Visit: Payer: Self-pay

## 2019-07-24 ENCOUNTER — Telehealth (INDEPENDENT_AMBULATORY_CARE_PROVIDER_SITE_OTHER): Payer: PPO | Admitting: Neurology

## 2019-07-24 VITALS — BP 140/70 | Wt 209.0 lb

## 2019-07-24 DIAGNOSIS — G250 Essential tremor: Secondary | ICD-10-CM

## 2019-07-24 MED ORDER — PRIMIDONE 50 MG PO TABS: 50.0000 mg | ORAL_TABLET | Freq: Two times a day (BID) | ORAL | 2 refills | Status: DC

## 2019-08-13 ENCOUNTER — Other Ambulatory Visit: Payer: Self-pay | Admitting: Internal Medicine

## 2019-08-25 HISTORY — PX: COLONOSCOPY: SHX174

## 2019-09-12 DIAGNOSIS — Z7984 Long term (current) use of oral hypoglycemic drugs: Secondary | ICD-10-CM | POA: Diagnosis not present

## 2019-09-12 DIAGNOSIS — E1129 Type 2 diabetes mellitus with other diabetic kidney complication: Secondary | ICD-10-CM | POA: Diagnosis not present

## 2019-09-12 DIAGNOSIS — R809 Proteinuria, unspecified: Secondary | ICD-10-CM | POA: Diagnosis not present

## 2019-09-12 DIAGNOSIS — E11649 Type 2 diabetes mellitus with hypoglycemia without coma: Secondary | ICD-10-CM | POA: Diagnosis not present

## 2019-09-12 DIAGNOSIS — E1165 Type 2 diabetes mellitus with hyperglycemia: Secondary | ICD-10-CM | POA: Diagnosis not present

## 2019-09-12 LAB — HEMOGLOBIN A1C: Hemoglobin A1C: 7.7

## 2019-09-16 ENCOUNTER — Other Ambulatory Visit: Payer: Self-pay | Admitting: Internal Medicine

## 2019-09-25 ENCOUNTER — Encounter: Payer: Self-pay | Admitting: Internal Medicine

## 2019-10-12 ENCOUNTER — Telehealth: Payer: Self-pay | Admitting: Internal Medicine

## 2019-10-12 NOTE — Chronic Care Management (AMB) (Signed)
  Chronic Care Management   Note  10/12/2019 Name: Randy Castro. MRN: [id number] DOB: [birth year]  Randy Castro. is a 68 y.o. year old male who is a primary care patient of Colon Branch, MD. I reached out to Randy Castro. by phone today in response to a referral sent by Mr. Kara Pacer Jr.'s PCP, Colon Branch, MD.   [name] was given information about Chronic Care Management services today including:  1. CCM service includes personalized support from designated clinical staff supervised by his physician, including individualized plan of care and coordination with other care providers 2. 24/7 contact phone numbers for assistance for urgent and routine care needs. 3. Service will only be billed when office clinical staff spend 20 minutes or more in a month to coordinate care. 4. Only one practitioner may furnish and bill the service in a calendar month. 5. The patient may stop CCM services at any time (effective at the end of the month) by phone call to the office staff. 6. The patient will be responsible for cost sharing (co-pay) of up to 20% of the service fee (after annual deductible is met).  Patient agreed to services and verbal consent obtained.   Follow up plan:   Raynicia Dukes [employer] Scheduler

## 2019-10-12 NOTE — Progress Notes (Signed)
°  Chronic Care Management   Outreach Note  10/12/2019 Name: Randy Castro. MRN: [id number] DOB: August 28, 1950  Referred by: Colon Branch, MD Reason for referral : No chief complaint on file.   An unsuccessful telephone outreach was attempted today. The patient was referred to the pharmacist for assistance with care management and care coordination.   Follow Up Plan:   Raynicia Dukes [employer] Scheduler

## 2019-10-20 ENCOUNTER — Telehealth: Payer: PPO

## 2019-10-20 ENCOUNTER — Other Ambulatory Visit: Payer: Self-pay

## 2019-10-20 DIAGNOSIS — R61 Generalized hyperhidrosis: Secondary | ICD-10-CM | POA: Diagnosis not present

## 2019-10-20 DIAGNOSIS — Z7902 Long term (current) use of antithrombotics/antiplatelets: Secondary | ICD-10-CM | POA: Diagnosis not present

## 2019-10-20 DIAGNOSIS — R58 Hemorrhage, not elsewhere classified: Secondary | ICD-10-CM | POA: Diagnosis not present

## 2019-10-20 DIAGNOSIS — R0689 Other abnormalities of breathing: Secondary | ICD-10-CM | POA: Diagnosis not present

## 2019-10-20 DIAGNOSIS — K922 Gastrointestinal hemorrhage, unspecified: Secondary | ICD-10-CM | POA: Diagnosis not present

## 2019-10-20 DIAGNOSIS — Z20822 Contact with and (suspected) exposure to covid-19: Secondary | ICD-10-CM | POA: Diagnosis not present

## 2019-10-20 DIAGNOSIS — E1165 Type 2 diabetes mellitus with hyperglycemia: Secondary | ICD-10-CM | POA: Diagnosis not present

## 2019-10-20 DIAGNOSIS — Z9884 Bariatric surgery status: Secondary | ICD-10-CM | POA: Diagnosis not present

## 2019-10-20 DIAGNOSIS — N200 Calculus of kidney: Secondary | ICD-10-CM | POA: Diagnosis not present

## 2019-10-20 DIAGNOSIS — I700 Atherosclerosis of aorta: Secondary | ICD-10-CM | POA: Diagnosis not present

## 2019-10-20 DIAGNOSIS — Z9049 Acquired absence of other specified parts of digestive tract: Secondary | ICD-10-CM | POA: Diagnosis not present

## 2019-10-20 DIAGNOSIS — Z79899 Other long term (current) drug therapy: Secondary | ICD-10-CM | POA: Diagnosis not present

## 2019-10-20 NOTE — Chronic Care Management (AMB) (Deleted)
Chronic Care Management Pharmacy  Name: Randy Castro.  MRN: [id number] DOB: 1951/03/23   Chief Complaint/ HPI  Randy Castro.,  69 y.o. , male presents for their Initial CCM visit with the clinical pharmacist via telephone due to COVID-19 Pandemic.  PCP : Colon Branch, MD  Their chronic conditions include: {CHL AMB CHRONIC MEDICAL CONDITIONS:[phone]}  Office Visits:***  Consult Visit:***  Medications: Outpatient Encounter Medications as of 10/20/2019  Medication Sig Note  . aspirin EC 81 MG tablet Take 81 mg by mouth daily.   Marland Kitchen atorvastatin (LIPITOR) 80 MG tablet Take 1 tablet (80 mg total) by mouth at bedtime.   Marland Kitchen azelastine (ASTELIN) 0.1 % nasal spray Place 1-2 sprays into both nostrils 2 (two) times daily. Use in each nostril as directed   . carvedilol (COREG) 12.5 MG tablet Take 1 tablet (12.5 mg total) by mouth 2 (two) times daily with a meal.   . citalopram (CELEXA) 20 MG tablet Take 1 tablet (20 mg total) by mouth daily.   . clopidogrel (PLAVIX) 75 MG tablet Take 1 tablet (75 mg total) by mouth daily.   . clotrimazole-betamethasone (LOTRISONE) cream Apply topically 2 (two) times daily. (Patient taking differently: Apply topically 2 (two) times daily as needed. ) 02/18/2017: Uses as needed  . dicyclomine (BENTYL) 10 MG capsule TAKE 1 TO 2 CAPSULES(10 TO 20 MG) BY MOUTH THREE TIMES DAILY AS NEEDED FOR SPASMS   . fluticasone (FLONASE) 50 MCG/ACT nasal spray Place 2 sprays into both nostrils daily.   Marland Kitchen glucose blood test strip Check blood sugar 2-3 times daily   . glucose monitoring kit (FREESTYLE) monitoring kit 1 each by Does not apply route as needed for other (Check sugar levels 3 times a Ahmari Duerson).   . insulin NPH Human (NOVOLIN N) 100 UNIT/ML injection Inject into the skin. 25 units in morning, 30 units in evening   . lisinopril (ZESTRIL) 5 MG tablet Take 5 mg by mouth daily.   . magnesium oxide (MAGNESIUM-OXIDE) 400 (241.3 Mg) MG tablet Take 1 tablet (400 mg total) by  mouth 2 (two) times daily.   . metFORMIN (GLUCOPHAGE) 1000 MG tablet Take 1,000 mg by mouth 2 (two) times daily.   . nitroGLYCERIN (NITROSTAT) 0.4 MG SL tablet Place 1 tablet (0.4 mg total) under the tongue every 5 (five) minutes as needed for chest pain. 05/04/2019: PRN  . omeprazole (PRILOSEC) 20 MG capsule Take 1 tablet by mouth twice a [name].   . ondansetron (ZOFRAN) 4 MG tablet TAKE 1 TABLET(4 MG) BY MOUTH EVERY 8 HOURS AS NEEDED FOR NAUSEA OR VOMITING   . Potassium 99 MG TABS Take 1 tablet by mouth daily.   . primidone (MYSOLINE) 50 MG tablet Take 1 tablet (50 mg total) by mouth 2 (two) times daily.   . ursodiol (ACTIGALL) 300 MG capsule Take 300 mg by mouth 2 (two) times daily.    No facility-administered encounter medications on file as of 10/20/2019.     Current Diagnosis/Assessment:  Goals Addressed   None     Diabetes   Recent Relevant Labs: Lab Results  Component Value Date/Time   HGBA1C 7.7 09/12/2019 12:00 AM   HGBA1C 8.2 04/20/2019 12:00 AM   MICROALBUR 1.72 08/05/2012 03:50 PM   MICROALBUR 1.0 04/13/2011 11:05 AM     Checking BG: {CHL HP Blood Glucose Monitoring [phone]}  Recent FBG Readings: Recent pre-meal BG readings: *** Recent 2hr PP BG readings:  *** Recent HS BG readings: *** Patient  has failed these meds in past: *** Patient is currently {CHL Controlled/Uncontrolled:[phone]} on the following medications: ***  Last diabetic Foot exam:  Lab Results  Component Value Date/Time   HMDIABEYEEXA No Retinopathy 03/29/2017 12:00 AM    Last diabetic Eye exam:  Lab Results  Component Value Date/Time   HMDIABFOOTEX Done 03/19/2019 12:00 AM     We discussed: {CHL HP Upstream Pharmacy discussion:[phone]}  Plan  Continue {CHL HP Upstream Pharmacy Plans:[phone]},  Hypertension   BP today is:  {CHL HP UPSTREAM Pharmacist BP ranges:[id number]}  Office blood pressures are  BP Readings from Last 3 Encounters:  07/24/19 140/70    07/12/19 (!) 145/69  05/04/19 (!) 139/48    Patient has failed these meds in the past: ***  Patient checks BP at home {CHL HP BP Monitoring Frequency:[phone]}  Patient home BP readings are ranging: ***  We discussed {CHL HP Upstream Pharmacy discussion:[phone]}  Plan  Continue {CHL HP Upstream Pharmacy Plans:[phone]}    Hyperlipidemia   Lipid Panel     Component Value Date/Time   CHOL 78 05/04/2019 0952   TRIG 104.0 05/04/2019 0952   HDL 35.70 (L) 05/04/2019 0952   CHOLHDL 2 05/04/2019 0952   VLDL 20.8 05/04/2019 0952   LDLCALC 21 05/04/2019 0952   LDLDIRECT 145.0 04/26/2018 1026     ASCVD 10-year risk: ***  Patient has failed these meds in past: *** Patient is currently {CHL Controlled/Uncontrolled:[phone]} on the following medications: ***  We discussed:  {CHL HP Upstream Pharmacy discussion:[phone]}  Plan  Continue {CHL HP Upstream Pharmacy Plans:[phone]}   CAD    Patient has failed these meds in past: *** Patient is currently {CHL Controlled/Uncontrolled:[phone]} on the following medications: ***  We discussed:  {CHL HP Upstream Pharmacy discussion:[phone]}  Plan  Continue {CHL HP Upstream Pharmacy Plans:[phone]}   Other Diagnosis:***    Patient has failed these meds in past: *** Patient is currently {CHL Controlled/Uncontrolled:[phone]} on the following medications: ***  We discussed:  {CHL HP Upstream Pharmacy discussion:[phone]}  Plan  Continue {CHL HP Upstream Pharmacy Plans:[phone]}   Other Diagnosis:***    Patient has failed these meds in past: *** Patient is currently {CHL Controlled/Uncontrolled:[phone]} on the following medications: ***  We discussed:  {CHL HP Upstream Pharmacy discussion:[phone]}  Plan  Continue {CHL HP Upstream Pharmacy Plans:[phone]}   Other Diagnosis:***    Patient has failed these meds in past: *** Patient is currently {CHL  Controlled/Uncontrolled:[phone]} on the following medications: ***  We discussed:  {CHL HP Upstream Pharmacy discussion:[phone]}  Plan  Continue {CHL HP Upstream Pharmacy Plans:[phone]}   Other Diagnosis:***    Patient has failed these meds in past: *** Patient is currently {CHL Controlled/Uncontrolled:[phone]} on the following medications: ***  We discussed:  {CHL HP Upstream Pharmacy discussion:[phone]}  Plan  Continue {CHL HP Upstream Pharmacy Plans:[phone]}   Other Diagnosis:***    Patient has failed these meds in past: *** Patient is currently {CHL Controlled/Uncontrolled:[phone]} on the following medications: ***  We discussed:  {CHL HP Upstream Pharmacy discussion:[phone]}  Plan  Continue {CHL HP Upstream Pharmacy Plans:[phone]}   Other Diagnosis:***    Patient has failed these meds in past: *** Patient is currently {CHL Controlled/Uncontrolled:[phone]} on the following medications: ***  We discussed:  {CHL HP Upstream Pharmacy discussion:[phone]}  Plan  Continue {CHL HP Upstream Pharmacy LTJQZ:[id number]}

## 2019-10-21 DIAGNOSIS — R58 Hemorrhage, not elsewhere classified: Secondary | ICD-10-CM | POA: Diagnosis not present

## 2019-10-21 DIAGNOSIS — R0902 Hypoxemia: Secondary | ICD-10-CM | POA: Diagnosis not present

## 2019-10-21 DIAGNOSIS — I959 Hypotension, unspecified: Secondary | ICD-10-CM | POA: Diagnosis not present

## 2019-10-22 DIAGNOSIS — Z7984 Long term (current) use of oral hypoglycemic drugs: Secondary | ICD-10-CM | POA: Diagnosis not present

## 2019-10-22 DIAGNOSIS — R600 Localized edema: Secondary | ICD-10-CM | POA: Diagnosis not present

## 2019-10-22 DIAGNOSIS — D120 Benign neoplasm of cecum: Secondary | ICD-10-CM | POA: Diagnosis not present

## 2019-10-22 DIAGNOSIS — Z7902 Long term (current) use of antithrombotics/antiplatelets: Secondary | ICD-10-CM | POA: Diagnosis not present

## 2019-10-22 DIAGNOSIS — Z9884 Bariatric surgery status: Secondary | ICD-10-CM | POA: Insufficient documentation

## 2019-10-22 DIAGNOSIS — I2510 Atherosclerotic heart disease of native coronary artery without angina pectoris: Secondary | ICD-10-CM | POA: Diagnosis not present

## 2019-10-22 DIAGNOSIS — D62 Acute posthemorrhagic anemia: Secondary | ICD-10-CM | POA: Diagnosis not present

## 2019-10-22 DIAGNOSIS — K922 Gastrointestinal hemorrhage, unspecified: Secondary | ICD-10-CM | POA: Diagnosis not present

## 2019-10-22 DIAGNOSIS — D128 Benign neoplasm of rectum: Secondary | ICD-10-CM | POA: Diagnosis not present

## 2019-10-22 DIAGNOSIS — M7989 Other specified soft tissue disorders: Secondary | ICD-10-CM | POA: Diagnosis not present

## 2019-10-22 DIAGNOSIS — E119 Type 2 diabetes mellitus without complications: Secondary | ICD-10-CM | POA: Diagnosis not present

## 2019-10-22 DIAGNOSIS — Z9049 Acquired absence of other specified parts of digestive tract: Secondary | ICD-10-CM | POA: Diagnosis not present

## 2019-10-22 DIAGNOSIS — Z800 Family history of malignant neoplasm of digestive organs: Secondary | ICD-10-CM | POA: Diagnosis not present

## 2019-10-23 DIAGNOSIS — Z955 Presence of coronary angioplasty implant and graft: Secondary | ICD-10-CM | POA: Diagnosis not present

## 2019-10-23 DIAGNOSIS — K921 Melena: Secondary | ICD-10-CM | POA: Diagnosis not present

## 2019-10-23 DIAGNOSIS — E119 Type 2 diabetes mellitus without complications: Secondary | ICD-10-CM | POA: Diagnosis not present

## 2019-10-23 DIAGNOSIS — K922 Gastrointestinal hemorrhage, unspecified: Secondary | ICD-10-CM | POA: Diagnosis not present

## 2019-10-23 DIAGNOSIS — K5730 Diverticulosis of large intestine without perforation or abscess without bleeding: Secondary | ICD-10-CM | POA: Diagnosis not present

## 2019-10-23 DIAGNOSIS — I2510 Atherosclerotic heart disease of native coronary artery without angina pectoris: Secondary | ICD-10-CM | POA: Diagnosis not present

## 2019-10-23 DIAGNOSIS — D120 Benign neoplasm of cecum: Secondary | ICD-10-CM | POA: Diagnosis not present

## 2019-10-23 DIAGNOSIS — D128 Benign neoplasm of rectum: Secondary | ICD-10-CM | POA: Diagnosis not present

## 2019-10-23 DIAGNOSIS — K289 Gastrojejunal ulcer, unspecified as acute or chronic, without hemorrhage or perforation: Secondary | ICD-10-CM | POA: Diagnosis not present

## 2019-10-23 HISTORY — PX: COLONOSCOPY WITH ESOPHAGOGASTRODUODENOSCOPY (EGD): SHX5779

## 2019-10-23 LAB — HM COLONOSCOPY

## 2019-10-23 MED ORDER — CITALOPRAM HYDROBROMIDE 20 MG PO TABS
20.00 | ORAL_TABLET | ORAL | Status: DC
Start: 2019-10-24 — End: 2019-10-23

## 2019-10-23 MED ORDER — ATORVASTATIN CALCIUM 80 MG PO TABS
80.00 | ORAL_TABLET | ORAL | Status: DC
Start: 2019-10-24 — End: 2019-10-23

## 2019-10-23 MED ORDER — DEXTROSE 50 % IV SOLN
25.00 | INTRAVENOUS | Status: DC
Start: ? — End: 2019-10-23

## 2019-10-23 MED ORDER — PANTOPRAZOLE SODIUM 40 MG PO TBEC
40.00 | DELAYED_RELEASE_TABLET | ORAL | Status: DC
Start: 2019-10-23 — End: 2019-10-23

## 2019-10-23 MED ORDER — GENERIC EXTERNAL MEDICATION
Status: DC
Start: ? — End: 2019-10-23

## 2019-10-23 MED ORDER — GLUCOSE 40 % PO GEL
ORAL | Status: DC
Start: ? — End: 2019-10-23

## 2019-10-23 MED ORDER — ACETAMINOPHEN 325 MG PO TABS
650.00 | ORAL_TABLET | ORAL | Status: DC
Start: ? — End: 2019-10-23

## 2019-10-23 MED ORDER — INSULIN LISPRO 100 UNIT/ML ~~LOC~~ SOLN
0.00 | SUBCUTANEOUS | Status: DC
Start: 2019-10-23 — End: 2019-10-23

## 2019-10-23 MED ORDER — POLYETHYLENE GLYCOL 3350 17 GM/SCOOP PO POWD
17.00 | ORAL | Status: DC
Start: ? — End: 2019-10-23

## 2019-10-23 MED ORDER — GLUCAGON (RDNA) 1 MG IJ KIT
1.00 | PACK | INTRAMUSCULAR | Status: DC
Start: ? — End: 2019-10-23

## 2019-10-23 MED ORDER — CARVEDILOL 12.5 MG PO TABS
12.50 | ORAL_TABLET | ORAL | Status: DC
Start: 2019-10-23 — End: 2019-10-23

## 2019-10-24 ENCOUNTER — Telehealth: Payer: Self-pay

## 2019-10-24 DIAGNOSIS — K922 Gastrointestinal hemorrhage, unspecified: Secondary | ICD-10-CM | POA: Insufficient documentation

## 2019-10-24 NOTE — Telephone Encounter (Signed)
Needs TCM please- was admitted to [facility] on 10/22/2019 to 10/23/2019.

## 2019-10-25 MED ORDER — GENERIC EXTERNAL MEDICATION
Status: DC
Start: ? — End: 2019-10-25

## 2019-10-25 NOTE — Telephone Encounter (Signed)
Transition Care Management Follow-up Telephone Call   Date discharged? 10/23/19   How have you been since you were released from the hospital? Doing fair.   Do you understand why you were in the hospital? yes   Do you understand the discharge instructions? yes   Where were you discharged to? Home   Items Reviewed:  Medications reviewed: yes  Allergies reviewed: yes  Dietary changes reviewed: yes, bland  Referrals reviewed: yes   Functional Questionnaire:   Activities of Daily Living (ADLs):   He states they are independent in the following: ambulation, bathing and hygiene, feeding, continence, grooming, toileting and dressing States they require assistance with the following: na   Any transportation issues/concerns?: no   Any patient concerns? no   Confirmed importance and date/time of follow-up visits scheduled yes  Provider Appointment booked with 10/31/19 @1120   Confirmed with patient if condition begins to worsen call PCP or go to the ER.  Patient was given the office number and encouraged to call back with question or concerns.  : yes

## 2019-10-26 NOTE — Telephone Encounter (Signed)
Thank you for letting me know about this [name].  I hope that you are feeling better, I am sure that that was a frightening experience.  I agree with stopping both aspirin and Plavix at this time in light of the recent bleeding ulcer.  In many of these situations, the GI physicians recommend at least a few weeks for the stomach to heal prior to considering resumption of antiplatelet therapy.  I will ask my nurse to make a follow-up visit over the next month and we may consider resuming coated aspirin only.

## 2019-10-31 ENCOUNTER — Telehealth: Payer: Self-pay | Admitting: Gastroenterology

## 2019-10-31 ENCOUNTER — Ambulatory Visit (INDEPENDENT_AMBULATORY_CARE_PROVIDER_SITE_OTHER): Payer: PPO | Admitting: Internal Medicine

## 2019-10-31 ENCOUNTER — Encounter: Payer: Self-pay | Admitting: Internal Medicine

## 2019-10-31 ENCOUNTER — Other Ambulatory Visit: Payer: Self-pay

## 2019-10-31 VITALS — BP 129/53 | HR 64 | Temp 97.1°F | Resp 18 | Ht 74.0 in | Wt 214.2 lb

## 2019-10-31 DIAGNOSIS — I10 Essential (primary) hypertension: Secondary | ICD-10-CM

## 2019-10-31 DIAGNOSIS — I723 Aneurysm of iliac artery: Secondary | ICD-10-CM

## 2019-10-31 DIAGNOSIS — K922 Gastrointestinal hemorrhage, unspecified: Secondary | ICD-10-CM | POA: Diagnosis not present

## 2019-10-31 LAB — CBC WITH DIFFERENTIAL/PLATELET
Basophils Absolute: 0.1 10*3/uL (ref 0.0–0.1)
Basophils Relative: 0.9 % (ref 0.0–3.0)
Eosinophils Absolute: 0.3 10*3/uL (ref 0.0–0.7)
Eosinophils Relative: 3.6 % (ref 0.0–5.0)
HCT: 28.3 % — ABNORMAL LOW (ref 39.0–52.0)
Hemoglobin: 9.9 g/dL — ABNORMAL LOW (ref 13.0–17.0)
Lymphocytes Relative: 19.7 % (ref 12.0–46.0)
Lymphs Abs: 1.6 10*3/uL (ref 0.7–4.0)
MCHC: 34.8 g/dL (ref 30.0–36.0)
MCV: 87.6 fl (ref 78.0–100.0)
Monocytes Absolute: 0.6 10*3/uL (ref 0.1–1.0)
Monocytes Relative: 7.2 % (ref 3.0–12.0)
Neutro Abs: 5.5 10*3/uL (ref 1.4–7.7)
Neutrophils Relative %: 68.6 % (ref 43.0–77.0)
Platelets: 364 10*3/uL (ref 150.0–400.0)
RBC: 3.23 Mil/uL — ABNORMAL LOW (ref 4.22–5.81)
RDW: 14.5 % (ref 11.5–15.5)
WBC: 8 10*3/uL (ref 4.0–10.5)

## 2019-10-31 LAB — COMPREHENSIVE METABOLIC PANEL
ALT: 49 U/L (ref 0–53)
AST: 26 U/L (ref 0–37)
Albumin: 3.5 g/dL (ref 3.5–5.2)
Alkaline Phosphatase: 76 U/L (ref 39–117)
BUN: 8 mg/dL (ref 6–23)
CO2: 30 mEq/L (ref 19–32)
Calcium: 8.5 mg/dL (ref 8.4–10.5)
Chloride: 103 mEq/L (ref 96–112)
Creatinine, Ser: 0.65 mg/dL (ref 0.40–1.50)
GFR: 122.1 mL/min (ref >60.00)
Glucose, Bld: 194 mg/dL — ABNORMAL HIGH (ref 70–99)
Potassium: 4.1 mEq/L (ref 3.5–5.1)
Sodium: 138 mEq/L (ref 135–145)
Total Bilirubin: 0.4 mg/dL (ref 0.2–1.2)
Total Protein: 5.4 g/dL — ABNORMAL LOW (ref 6.0–8.3)

## 2019-10-31 MED ORDER — PANTOPRAZOLE SODIUM 40 MG PO TBEC: 40.0000 mg | DELAYED_RELEASE_TABLET | Freq: Two times a day (BID) | ORAL | 6 refills | Status: DC

## 2019-10-31 NOTE — Telephone Encounter (Signed)
Previous GI records printed from Epic and going to place on Dr. Steve Rattler desk for review.  I spoke with the patient and he said he was hospitalized and then transferred to [facility] and saw a GI doctor there.  His PCP is recommending that he is seen within [facility] and he would like to see Dr. Lyndel Safe.  Please advice scheduling.  Thanks!

## 2019-10-31 NOTE — Progress Notes (Signed)
Subjective:    Patient ID: Randy Castro., male    DOB: Sep 14, 1950, 69 y.o.   MRN: [id number]  DOS:  10/31/2019 Type of visit - description: TCM  Admitted to the hospital 10/22/2019, discharge 10/23/2019. He presented to the hospital, diagnosed with a GI bleed. Work-up: Colonoscopy revealed diverticulosis in the sigmoid and descending colon, 2 polyps which were removed, preparation was felt to be poor and was recommended repeat colonoscopy at the time of next  EGD (in 2 months). BX: Tubular adenomas. 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 This was felt to be the primary source of bleeding. BX: No Helicobacter, oxyntic mucosa with no diagnostic morphologic abnormality Plavix, aspirin held. Rx PPI twice daily.  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 Hemoglobin 10/23/2019: 8.7 Blood sugar was in the low 200s 10/22/2019: Creatinine 0.5, potassium 3.9, LFTs normal, hemoglobin 9.0   Review of Systems Since he left the hospital, he is compliant with all instructions. Report that overall feels better, good p.o. tolerance. Still tired No further blood in the stools, BMs are daily, Forgy in color.  No fever chills No chest pain no difficulty breathing.  No lower extremity edema. Still has occasional nausea but no vomiting no diarrhea.   Past Medical History:  Diagnosis Date  . Arthritis   . CAD (coronary artery disease)    a. NSTEMI 12/12: EF 40-45%. VFI:EPPIRJJ balloon PTCA + Promus DES x 1 to mid LAD;  b. 11/2012: Cath with Cutting balloon PTCA  LAD for ISR to LAD, EF 55%;  c. 12/2012 Cath/PCI: LAD stents patent, 80 ost Diag (jailed)->PTCA, LCX/RCA patent.  . Essential hypertension   . Hyperlipidemia   . Ischemic cardiomyopathy    a.  echo 08/06/11: dist ant wall, apical and septal and  infero-apical HK, mild LVH, EF 40%, mild LAE, PASP 34, asc aorta mildly dilated, mild TR;  b.  Echo (3/13) showed recovery of LV systolic function with EF 88-41%, grade I diastolic dysfunction, mild MR.   . Type 2 diabetes mellitus ([place])     Past Surgical History:  Procedure Laterality Date  . ANTERIOR CERVICAL DECOMP/DISCECTOMY FUSION  in the 90s  . CHOLECYSTECTOMY  03/27/2019  . CORONARY ANGIOPLASTY  12/15/2012; 12/22/2012  . CORONARY ANGIOPLASTY WITH STENT PLACEMENT  07/2011   "1" (12/22/2012)  . GASTRIC BYPASS  12/21/2017  . KNEE ARTHROSCOPY Left 12/31/2016  . LEFT HEART CATHETERIZATION WITH CORONARY ANGIOGRAM N/A 08/06/2011   Procedure: LEFT HEART CATHETERIZATION WITH CORONARY ANGIOGRAM;  Surgeon: Burnell Blanks, MD;  Location: [facility] CATH LAB;  Service: Cardiovascular;  Laterality: N/A;  . LEFT HEART CATHETERIZATION WITH CORONARY ANGIOGRAM N/A 12/15/2012   Procedure: LEFT HEART CATHETERIZATION WITH CORONARY ANGIOGRAM;  Surgeon: Sherren Mocha, MD;  Location: [facility] CATH LAB;  Service: Cardiovascular;  Laterality: N/A;  . LEFT HEART CATHETERIZATION WITH CORONARY ANGIOGRAM N/A 12/22/2012   Procedure: LEFT HEART CATHETERIZATION WITH CORONARY ANGIOGRAM;  Surgeon: Sherren Mocha, MD;  Location: [facility] CATH LAB;  Service: Cardiovascular;  Laterality: N/A;  . PERCUTANEOUS CORONARY STENT INTERVENTION (PCI-S) Right 12/15/2012   Procedure: PERCUTANEOUS CORONARY STENT INTERVENTION (PCI-S);  Surgeon: Sherren Mocha, MD;  Location: [facility] CATH LAB;  Service: Cardiovascular;  Laterality: Right;    Allergies as of 10/31/2019      Reactions   Trulicity [dulaglutide] Nausea And Vomiting      Medication List  Accurate as of October 31, 2019 11:59 PM. If you have any questions, ask your nurse or doctor.        STOP taking these medications   azelastine 0.1 % nasal spray Commonly known as: ASTELIN Stopped by: Kathlene November, MD   fluticasone 50 MCG/ACT nasal spray Commonly known as: FLONASE Stopped by: Kathlene November, MD   omeprazole 20 MG capsule Commonly known as: PRILOSEC Stopped by: Kathlene November, MD     TAKE these medications   aspirin EC 81 MG tablet Take 81 mg by mouth daily.   atorvastatin 80 MG tablet Commonly known as: LIPITOR Take 1 tablet (80 mg total) by mouth at bedtime.   carvedilol 12.5 MG tablet Commonly known as: COREG Take 1 tablet (12.5 mg total) by mouth 2 (two) times daily with a meal.   citalopram 20 MG tablet Commonly known as: CELEXA Take 1 tablet (20 mg total) by mouth daily.   clopidogrel 75 MG tablet Commonly known as: PLAVIX Take 1 tablet (75 mg total) by mouth daily.   clotrimazole-betamethasone cream Commonly known as: LOTRISONE Apply topically 2 (two) times daily. What changed:   when to take this  reasons to take this   dicyclomine 10 MG capsule Commonly known as: BENTYL TAKE 1 TO 2 CAPSULES(10 TO 20 MG) BY MOUTH THREE TIMES DAILY AS NEEDED FOR SPASMS   glucose blood test strip Check blood sugar 2-3 times daily   glucose monitoring kit monitoring kit 1 each by Does not apply route as needed for other (Check sugar levels 3 times a day).   lisinopril 5 MG tablet Commonly known as: ZESTRIL Take 5 mg by mouth daily.   magnesium oxide 400 (241.3 Mg) MG tablet Commonly known as: MAGnesium-Oxide Take 1 tablet (400 mg total) by mouth 2 (two) times daily.   metFORMIN 1000 MG tablet Commonly known as: GLUCOPHAGE Take 1,000 mg by mouth 2 (two) times daily.   nitroGLYCERIN 0.4 MG SL tablet Commonly known as: NITROSTAT Place 1 tablet (0.4 mg total) under the tongue every 5 (five) minutes as needed for chest pain.   NovoLIN N 100 UNIT/ML injection Generic drug: insulin NPH Human Inject into the skin. 15 units in morning, 15 units in evening   ondansetron 4 MG tablet Commonly known as: ZOFRAN TAKE 1 TABLET(4 MG) BY MOUTH EVERY 8 HOURS AS NEEDED FOR NAUSEA OR VOMITING   pantoprazole 40 MG tablet Commonly known as: PROTONIX Take 1 tablet (40  mg total) by mouth 2 (two) times daily before a meal.   Potassium 99 MG Tabs Take 1 tablet by mouth daily.   primidone 50 MG tablet Commonly known as: MYSOLINE Take 1 tablet (50 mg total) by mouth 2 (two) times daily.   ursodiol 300 MG capsule Commonly known as: ACTIGALL Take 300 mg by mouth 2 (two) times daily.             Objective:   Physical Exam BP (!) 129/53 (BP Location: Left Arm, Patient Position: Sitting, Cuff Size: Normal)   Pulse 64   Temp (!) 97.1 F (36.2 C) (Temporal)   Resp 18   Ht 6' 2"  (1.88 m)   Wt 214 lb 4 oz (97.2 kg)   SpO2 100%   BMI 27.51 kg/m  General:   Well developed, NAD, BMI noted.  HEENT:  Normocephalic . Face symmetric, atraumatic Lungs:  CTA B Normal respiratory effort, no intercostal retractions, no accessory muscle use. Heart: RRR,  no murmur.  Abdomen:  Not  distended, soft, non-tender. No rebound or rigidity.   Skin: Not pale. Not jaundice Lower extremities: no pretibial edema bilaterally  Neurologic:  alert & oriented X3.  Speech normal, gait appropriate for age and unassisted Psych--  Cognition and judgment appear intact.  Cooperative with normal attention span and concentration.  Behavior appropriate. No anxious or depressed appearing.     Assessment     Assessment   DM -- insulin dependent, uncontrolled, with complications, CAD CHF.  Endo: [facility] HTN Hyperlipidemia CAD, CHF, ischemic cardiomyopathy, PVCs (Holter 2019 show 13.6% burden) NSTEMI in 12/12, unstable angina in 4/14 and 5/14. He had DES to mid LAD in 12/12. EF was 40% with apical hypokinesis at the time of the MI. Repeat echo in 3/13 showed EF 55-60%. In 4/14, he was admitted with unstable angina and had 90% pLAD in-stent restenosis. This was treated with cutting balloon angioplasty. Readmitted, again with unstable angina, in 5/14. This time he had PTCA to 90% ostial D1 stenosis.  OSA dx ~08-2017, om Cpap Anxiety-depression Essential  Tremor dx 2019  Skin cancer: Right leg, status post excision 2016; Dr Allyson Sabal Morbid obesity:gastric sleeve, 11-2017 Dr Raul Del 2-20 21 GI bleed, gastric ulcer, required transfusion  PLAN: GI bleed: See HPI, recently admitted to due to upper GI bleed, endoscopy showed ulcer.  Currently holding aspirin and Plavix.  BMs are back to normal, good compliance with PPIs twice a day. Plan: Endoscopies were done in [facility], requests a local GI, referral sent. We will ask local GI when he safe to resume aspirin and Plavix.  We will also discussed with cardiology. Addendum: Appreciate GI/cards response. Okay to restart aspirin 5 days after the procedures.  Will let patient know. Add Carafate is an option Rx sent, see phone note. Would be okay to restart Plavix after 2 weeks however cardiology advised to stop it for now. Check a CMP CBC. RF Protonix Continue watching for GI symptoms, call immediately if abdominal pain no change in the color of the stools HTN: Seems controlled Diabetes: Taking less insulin, 15 units twice a day per patient. 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 Follow-up 2 months already scheduled    This visit occurred during the SARS-CoV-2 public health emergency.  Safety protocols were in place, including screening questions prior to the visit, additional usage of staff PPE, and extensive cleaning of exam room while observing appropriate contact time as indicated for disinfecting solutions.

## 2019-10-31 NOTE — Patient Instructions (Signed)
GO TO THE LAB : Get the blood work    Continue holding aspirin and Plavix for now

## 2019-10-31 NOTE — Progress Notes (Signed)
Pre visit review using our clinic review tool, if applicable. No additional management support is needed unless otherwise documented below in the visit note. 

## 2019-11-01 ENCOUNTER — Other Ambulatory Visit: Payer: Self-pay | Admitting: Internal Medicine

## 2019-11-01 ENCOUNTER — Telehealth: Payer: Self-pay | Admitting: Internal Medicine

## 2019-11-01 DIAGNOSIS — I723 Aneurysm of iliac artery: Secondary | ICD-10-CM | POA: Insufficient documentation

## 2019-11-01 MED ORDER — SUCRALFATE 1 GM/10ML PO SUSP: 1.0000 g | Freq: Four times a day (QID) | ORAL | 0 refills | Status: DC

## 2019-11-01 NOTE — Telephone Encounter (Signed)
OK to schedule with Colleen. Please have records available for her during his office visit. I have given records to [facility]

## 2019-11-01 NOTE — Telephone Encounter (Signed)
Please call patient: Okay to restart aspirin 81 mg. Continue holding Plavix Start Carafate suspension 1 g QID  for 1 month, please send a prescription.

## 2019-11-01 NOTE — Assessment & Plan Note (Signed)
GI bleed: See HPI, recently admitted to due to upper GI bleed, endoscopy showed ulcer.  Currently holding aspirin and Plavix.  BMs are back to normal, good compliance with PPIs twice a day. Plan: Endoscopies were done in [facility], requests a local GI, referral sent. We will ask local GI when he safe to resume aspirin and Plavix.  We will also discussed with cardiology. Addendum: Appreciate GI/cards response. Okay to restart aspirin 5 days after the procedures.  Will let patient know. Add Carafate is an option Rx sent, see phone note. Would be okay to restart Plavix after 2 weeks however cardiology advised to stop it for now. Check a CMP CBC. RF Protonix Continue watching for GI symptoms, call immediately if abdominal pain no change in the color of the stools HTN: Seems controlled Diabetes: Taking less insulin, 15 units twice a day per patient. 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 Follow-up 2 months already scheduled

## 2019-11-01 NOTE — Telephone Encounter (Signed)
Spoke w/ Pt- informed of recommendations. Pt verbalized understanding. Carafate suspension sent to [place].

## 2019-11-02 NOTE — Telephone Encounter (Signed)
Addendum: Discussed with cardiology, DC Plavix for now.

## 2019-11-06 ENCOUNTER — Emergency Department (HOSPITAL_BASED_OUTPATIENT_CLINIC_OR_DEPARTMENT_OTHER)
Admission: EM | Admit: 2019-11-06 | Discharge: 2019-11-06 | Disposition: A | Discharge status: Home / Self Care | Payer: PPO | Attending: Emergency Medicine | Admitting: Emergency Medicine

## 2019-11-06 ENCOUNTER — Other Ambulatory Visit: Payer: Self-pay

## 2019-11-06 ENCOUNTER — Encounter (HOSPITAL_BASED_OUTPATIENT_CLINIC_OR_DEPARTMENT_OTHER): Payer: Self-pay

## 2019-11-06 ENCOUNTER — Ambulatory Visit: Payer: PPO | Admitting: Gastroenterology

## 2019-11-06 ENCOUNTER — Encounter: Payer: Self-pay | Admitting: Gastroenterology

## 2019-11-06 VITALS — BP 94/58 | HR 63 | Temp 97.7°F | Ht 74.0 in | Wt 216.0 lb

## 2019-11-06 DIAGNOSIS — Z87891 Personal history of nicotine dependence: Secondary | ICD-10-CM | POA: Insufficient documentation

## 2019-11-06 DIAGNOSIS — Z7982 Long term (current) use of aspirin: Secondary | ICD-10-CM | POA: Diagnosis not present

## 2019-11-06 DIAGNOSIS — E785 Hyperlipidemia, unspecified: Secondary | ICD-10-CM | POA: Diagnosis not present

## 2019-11-06 DIAGNOSIS — K5730 Diverticulosis of large intestine without perforation or abscess without bleeding: Secondary | ICD-10-CM | POA: Diagnosis not present

## 2019-11-06 DIAGNOSIS — Z9861 Coronary angioplasty status: Secondary | ICD-10-CM | POA: Insufficient documentation

## 2019-11-06 DIAGNOSIS — E119 Type 2 diabetes mellitus without complications: Secondary | ICD-10-CM | POA: Diagnosis not present

## 2019-11-06 DIAGNOSIS — I959 Hypotension, unspecified: Secondary | ICD-10-CM | POA: Diagnosis not present

## 2019-11-06 DIAGNOSIS — Z794 Long term (current) use of insulin: Secondary | ICD-10-CM | POA: Diagnosis not present

## 2019-11-06 DIAGNOSIS — K289 Gastrojejunal ulcer, unspecified as acute or chronic, without hemorrhage or perforation: Secondary | ICD-10-CM

## 2019-11-06 DIAGNOSIS — Z79899 Other long term (current) drug therapy: Secondary | ICD-10-CM | POA: Insufficient documentation

## 2019-11-06 DIAGNOSIS — Z888 Allergy status to other drugs, medicaments and biological substances status: Secondary | ICD-10-CM | POA: Insufficient documentation

## 2019-11-06 DIAGNOSIS — D62 Acute posthemorrhagic anemia: Secondary | ICD-10-CM | POA: Diagnosis not present

## 2019-11-06 DIAGNOSIS — I10 Essential (primary) hypertension: Secondary | ICD-10-CM | POA: Diagnosis not present

## 2019-11-06 DIAGNOSIS — R42 Dizziness and giddiness: Secondary | ICD-10-CM | POA: Diagnosis not present

## 2019-11-06 DIAGNOSIS — I2510 Atherosclerotic heart disease of native coronary artery without angina pectoris: Secondary | ICD-10-CM | POA: Insufficient documentation

## 2019-11-06 DIAGNOSIS — Z86010 Personal history of colonic polyps: Secondary | ICD-10-CM

## 2019-11-06 DIAGNOSIS — I252 Old myocardial infarction: Secondary | ICD-10-CM | POA: Insufficient documentation

## 2019-11-06 HISTORY — DX: Gastrointestinal hemorrhage, unspecified: K92.2

## 2019-11-06 LAB — COMPREHENSIVE METABOLIC PANEL
ALT: 45 U/L — ABNORMAL HIGH (ref 0–44)
AST: 40 U/L (ref 15–41)
Albumin: 3.7 g/dL (ref 3.5–5.0)
Alkaline Phosphatase: 76 U/L (ref 38–126)
Anion gap: 9 (ref 5–15)
BUN: 11 mg/dL (ref 8–23)
CO2: 29 mmol/L (ref 22–32)
Calcium: 8.8 mg/dL — ABNORMAL LOW (ref 8.9–10.3)
Chloride: 103 mmol/L (ref 98–111)
Creatinine, Ser: 0.7 mg/dL (ref 0.61–1.24)
GFR calc Af Amer: >60 mL/min (ref >60)
GFR calc non Af Amer: >60 mL/min (ref >60)
Glucose, Bld: 195 mg/dL — ABNORMAL HIGH (ref 70–99)
Potassium: 3.4 mmol/L — ABNORMAL LOW (ref 3.5–5.1)
Sodium: 141 mmol/L (ref 135–145)
Total Bilirubin: 0.4 mg/dL (ref 0.3–1.2)
Total Protein: 6.3 g/dL — ABNORMAL LOW (ref 6.5–8.1)

## 2019-11-06 LAB — CBC WITH DIFFERENTIAL/PLATELET
Abs Immature Granulocytes: 0.02 10*3/uL (ref 0.00–0.07)
Basophils Absolute: 0.1 10*3/uL (ref 0.0–0.1)
Basophils Relative: 1 %
Eosinophils Absolute: 0.4 10*3/uL (ref 0.0–0.5)
Eosinophils Relative: 5 %
HCT: 31.5 % — ABNORMAL LOW (ref 39.0–52.0)
Hemoglobin: 10.4 g/dL — ABNORMAL LOW (ref 13.0–17.0)
Immature Granulocytes: 0 %
Lymphocytes Relative: 22 %
Lymphs Abs: 1.7 10*3/uL (ref 0.7–4.0)
MCH: 30.1 pg (ref 26.0–34.0)
MCHC: 33 g/dL (ref 30.0–36.0)
MCV: 91.3 fL (ref 80.0–100.0)
Monocytes Absolute: 0.6 10*3/uL (ref 0.1–1.0)
Monocytes Relative: 8 %
Neutro Abs: 4.9 10*3/uL (ref 1.7–7.7)
Neutrophils Relative %: 64 %
Platelets: 347 10*3/uL (ref 150–400)
RBC: 3.45 MIL/uL — ABNORMAL LOW (ref 4.22–5.81)
RDW: 14.1 % (ref 11.5–15.5)
WBC: 7.7 10*3/uL (ref 4.0–10.5)
nRBC: 0 % (ref 0.0–0.2)

## 2019-11-06 LAB — OCCULT BLOOD X 1 CARD TO LAB, STOOL: Fecal Occult Bld: NEGATIVE

## 2019-11-06 MED ORDER — IRON (FERROUS SULFATE) 325 (65 FE) MG PO TABS: 325.0000 mg | ORAL_TABLET | Freq: Every day | ORAL | 3 refills | Status: DC

## 2019-11-06 MED ORDER — SODIUM CHLORIDE 0.9 % IV BOLUS
500.0000 mL | Freq: Once | INTRAVENOUS | Status: AC
  Administered 2019-11-06: 500 mL via INTRAVENOUS

## 2019-11-06 MED ORDER — POTASSIUM CHLORIDE CRYS ER 10 MEQ PO TBCR: 10.0000 meq | EXTENDED_RELEASE_TABLET | Freq: Two times a day (BID) | ORAL | 0 refills | Status: DC

## 2019-11-06 NOTE — ED Notes (Signed)
Pt reports no dizziness, nausea or lightheadedness

## 2019-11-06 NOTE — Progress Notes (Signed)
Chief Complaint: Marginal ulcer, recent GI bleed, hospital follow-up  Referring Provider:     Colon Branch, MD   HPI:     Randy Castro. is a 69 y.o. 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  He is new to our practice.  He was recently admitted to [facility] 10/22/2019-10/23/2019 with GI bleed.  Received 4U pRBC blood transfusion in total.  Evaluation during hospitalization: -CTA without acute bleeding -Colonoscopy revealed diverticulosis in the sigmoid and descending colon, 2 polyps which were removed, preparation was felt to be poor and was recommended repeat colonoscopy at the time of next  EGD (in 2 months). BX: Tubular adenomas. 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 This was felt to be the primary source of bleeding.  Recommended repeat in 8 weeks BX: No Helicobacter, oxyntic mucosa with no diagnostic morphologic abnormality -Plavix, aspirin held. -Rx PPI twice daily.  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 -Hemoglobin 10/23/2019: 8.7 -Blood sugar was in the low 200s -10/22/2019: Creatinine 0.5, potassium 3.9, LFTs normal, hemoglobin 9.0  Was seen in follow-up by Dr. Larose Kells on 10/31/2019.  No further overt GI bleed.  Occasional nausea, but no vomiting.  Taking all medications as prescribed and continue holding Plavix currently per Cardiology.  Today, lightheadedness and "everything looks gray". Some gait unsteadiness today, bracing wife's shoulder walking through clinic. Has had  these sxs intermittently since hospital d/c. Still fatigued. Nausea with any PO, so only eating a few bites of food per meal. Overall decreased appetite. Occasional lower abdominal discomfort, but not pain. No f/c. No melena, hematochezia.   Currently taking Protonix 40 mg bid and Carafate qid.  Repeat labs 10/31/2019: H/H 9.9/28.3, MCV/RDW 87/14, normal CMP  Baseline (05/04/2019): H/H 13/38, MCV/RDW 87/14.  Normal ferritin/iron   Past Medical History:  Diagnosis Date  . Arthritis   . CAD (coronary artery disease)    a. NSTEMI 12/12: EF 40-45%. HDQ:QIWLNLG balloon PTCA + Promus DES x 1 to mid LAD;  b. 11/2012: Cath with Cutting balloon PTCA  LAD for ISR to LAD, EF 55%;  c. 12/2012 Cath/PCI: LAD stents patent, 80 ost Diag (jailed)->PTCA, LCX/RCA patent.  . Essential hypertension   . Hyperlipidemia   . Ischemic cardiomyopathy    a.  echo 08/06/11: dist ant wall, apical and septal and infero-apical HK, mild LVH, EF 40%, mild LAE, PASP 34, asc aorta mildly dilated, mild TR;  b.  Echo (3/13) showed recovery of LV systolic function with EF 92-11%, grade I diastolic dysfunction, mild MR.   . Type 2 diabetes mellitus ([place])      Past Surgical History:  Procedure Laterality Date  . ANTERIOR CERVICAL DECOMP/DISCECTOMY FUSION  in the 90s  . CHOLECYSTECTOMY  03/27/2019  . COLONOSCOPY WITH ESOPHAGOGASTRODUODENOSCOPY (EGD)  10/2019  . CORONARY ANGIOPLASTY  12/15/2012; 12/22/2012  . CORONARY ANGIOPLASTY WITH STENT PLACEMENT  07/2011   "1" (12/22/2012)  . GASTRIC BYPASS  12/21/2017  . KNEE ARTHROSCOPY Left 12/31/2016  . LEFT HEART CATHETERIZATION WITH CORONARY ANGIOGRAM N/A  08/06/2011   Procedure: LEFT HEART CATHETERIZATION WITH CORONARY ANGIOGRAM;  Surgeon: Burnell Blanks, MD;  Location: [facility] CATH LAB;  Service: Cardiovascular;  Laterality: N/A;  . LEFT HEART CATHETERIZATION WITH CORONARY ANGIOGRAM N/A 12/15/2012   Procedure: LEFT HEART CATHETERIZATION WITH CORONARY ANGIOGRAM;  Surgeon: Sherren Mocha, MD;  Location: [facility] CATH LAB;  Service: Cardiovascular;  Laterality: N/A;  . LEFT HEART CATHETERIZATION WITH CORONARY ANGIOGRAM N/A 12/22/2012   Procedure: LEFT HEART CATHETERIZATION WITH CORONARY ANGIOGRAM;  Surgeon: Sherren Mocha, MD;  Location: [facility] CATH LAB;  Service: Cardiovascular;  Laterality: N/A;  . PERCUTANEOUS CORONARY STENT INTERVENTION (PCI-S) Right 12/15/2012   Procedure: PERCUTANEOUS CORONARY STENT INTERVENTION (PCI-S);  Surgeon: Sherren Mocha, MD;  Location: [facility] CATH LAB;  Service: Cardiovascular;  Laterality: Right;   Family History  Problem Relation Age of Onset  . Diabetes Brother   . Lung cancer Brother   . Diabetes Mother   . Coronary artery disease Father   . Heart attack Father 74  . Diabetes Sister   . Coronary artery disease Sister   . Colon cancer Neg Hx   . Prostate cancer Neg Hx   . Stroke Neg Hx   . Esophageal cancer Neg Hx    Social History   Tobacco Use  . Smoking status: Former Smoker    Packs/day: 1.00    Years: 25.00    Pack years: 25.00    Types: Cigarettes    Quit date: 04/12/1992    Years since quitting: 27.5  . Smokeless tobacco: Never Used  Substance Use Topics  . Alcohol use: Yes    Comment: 12/22/2012 "rarely maybe every 3-4 months, beer"  . Drug use: No   Current Outpatient Medications  Medication Sig Dispense Refill  . aspirin EC 81 MG tablet Take 81 mg by mouth daily.    Marland Kitchen atorvastatin (LIPITOR) 80 MG tablet Take 1 tablet (80 mg total) by mouth at bedtime. 90 tablet 1  . carvedilol (COREG) 12.5 MG tablet TAKE 1 TABLET(12.5 MG) BY MOUTH TWICE DAILY WITH A MEAL 180 tablet 1  . citalopram (CELEXA) 20 MG tablet Take 1 tablet (20 mg total) by mouth daily. 90 tablet 1  . clotrimazole-betamethasone (LOTRISONE) cream Apply topically 2 (two) times daily. (Patient taking differently: Apply topically 2 (two) times daily as needed. ) 30 g 0  . dicyclomine (BENTYL) 10 MG capsule TAKE 1 TO 2 CAPSULES(10 TO 20 MG) BY MOUTH THREE TIMES DAILY AS  NEEDED FOR SPASMS 30 capsule 0  . glucose blood test strip Check blood sugar 2-3 times daily 100 each 12  . glucose monitoring kit (FREESTYLE) monitoring kit 1 each by Does not apply route as needed for other (Check sugar levels 3 times a day). 1 each 0  . insulin NPH Human (NOVOLIN N) 100 UNIT/ML injection Inject into the skin. 15 units in morning, 15 units in evening     . lisinopril (ZESTRIL) 5 MG tablet Take 5 mg by mouth daily.    . magnesium oxide (MAGNESIUM-OXIDE) 400 (241.3 Mg) MG tablet Take 1 tablet (400 mg total) by mouth 2 (two) times daily. 180 tablet 3  . metFORMIN (GLUCOPHAGE) 1000 MG tablet Take 1,000 mg by mouth 2 (two) times daily.    . pantoprazole (PROTONIX) 40 MG tablet Take 1 tablet (40 mg total) by mouth 2 (two) times daily before a meal. 60 tablet 6  . Potassium 99 MG TABS Take 1 tablet by mouth daily.    . primidone (MYSOLINE) 50 MG tablet  Take 1 tablet (50 mg total) by mouth 2 (two) times daily. 180 tablet 2  . sucralfate (CARAFATE) 1 GM/10ML suspension Take 10 mLs (1 g total) by mouth 4 (four) times daily. 1200 mL 0  . nitroGLYCERIN (NITROSTAT) 0.4 MG SL tablet Place 1 tablet (0.4 mg total) under the tongue every 5 (five) minutes as needed for chest pain. (Patient not taking: Reported on 10/25/2019) 100 tablet 3   No current facility-administered medications for this visit.   Allergies  Allergen Reactions  . Trulicity [Dulaglutide] Nausea And Vomiting     Review of Systems: All systems reviewed and negative except where noted in HPI.     Physical Exam:    Wt Readings from Last 3 Encounters:  11/06/19 216 lb (98 kg)  10/31/19 214 lb 4 oz (97.2 kg)  07/24/19 209 lb (94.8 kg)    BP (!) 94/58   Pulse 63   Temp 97.7 F (36.5 C)   Ht 6' 2"  (1.88 m)   Wt 216 lb (98 kg)   BMI 27.73 kg/m  Constitutional:  Pleasant, in no acute distress. Psychiatric: Normal mood and affect. Behavior is normal. EENT: Pupils normal.  Conjunctivae are normal. No scleral  icterus. Neck supple. No cervical LAD. Cardiovascular: Normal rate, regular rhythm. No edema Pulmonary/chest: Effort normal and breath sounds normal. No wheezing, rales or rhonchi. Abdominal: Soft, nondistended, nontender. Bowel sounds active throughout. There are no masses palpable. No hepatomegaly. Neurological: Alert and oriented to person place and time. Skin: Pale complexion.  Skin is warm and dry. No rashes noted.   ASSESSMENT AND PLAN;   1) Hypotension 2) Lightheadedness 69 year old male recently admitted to [facility] with GI bleed secondary to marginal ulcer, treated with medical management without endoscopic intervention.  Had been doing better since discharge, but more recently with lightheadedness, fatigue, gait unsteadiness.  Uptrending hemoglobin last week, but given current constellation of symptoms, pale complexion, BP 94/58, plan for the following: -Send downstairs to [facility] ER for expedited evaluation, IVF, labs.  If significant anemia reflective of occult GIB, may need hospital admission versus expediting outpatient work-up  3) Marginal ulcer 4) Acute blood loss anemia 5) History of gastric bypass -Resume Carafate -Resume Protonix bid.  Advised to crush tablet to facilitate ulcer healing -Continue p.o. intake as tolerated -Plan for EGD with optimal timing pending ER evaluation today.  If hemoglobin stable/uptrending and no plans for hospital admission, repeat EGD in another 4-6 weeks to assess ulcer healing, staple removal, etc. -Start ferrous sulfate.  If intolerant, discussed IV iron  6) Tubular adenoma -Recent inpatient colonoscopy with tubular adenoma.  Given suboptimal bowel prep, was given recommendation to repeat colonoscopy next available -Plan for colonoscopy to be done as an outpatient at the same time as EGD as above  7) History of CAD -Plavix on hold per cardiology -ASA okay  I spent 45 minutes of time, including in depth chart review, independent  review of results as outlined above, communicating results with the patient directly, face-to-face time with the patient, coordinating care, ordering studies and medications as appropriate, and documentation.     Lavena Bullion, DO, FACG  11/06/2019, 1:30 PM   Colon Branch, MD

## 2019-11-06 NOTE — Patient Instructions (Signed)
It has been recommended to you by your physician that you have a(n) EGD/colonoscopy completed. Per your request, we did not schedule the procedure(s) today. Please contact our office at [phone] should you decide to have the procedure completed. You will be scheduled for a pre-visit and procedure at that time.  We have sent the following medications to your pharmacy for you to pick up at your convenience: Ferrous Sulfate  It was a pleasure to see you today!  Vito Cirigliano, D.O.

## 2019-11-06 NOTE — ED Provider Notes (Signed)
[name] Note   CSN: [id number] Arrival date & time: 11/06/19  1411     History Chief Complaint  Patient presents with  . Dizziness    Randy Castro. is a 69 y.o. male.  HPI      Randy Castro. is a 69 y.o. male, with a history of CAD, HTN, hyperlipidemia, DM, presenting to the ED sent from [place] office upstairs. They noted patient appeared to be pale with lightheadedness and BP of 94/58.  He was sent to our ED for further evaluation.  Patient notes he was seen at [facility] for GI bleed on February 28. He was transferred and subsequently admitted to [facility] in [place], [place], discharged March 1.  Patient states he experiences lightheadedness at baseline, but this has been worse since his discharge from the hospital.  He has also had poor appetite. Last food was this morning around 9 AM, but was less than he usually would have. He has been off his Plavix since his hospital admission, but was told he could restart his aspirin at the beginning of last week.  Denies use of NSAIDs, alcohol, tobacco.  Denies previous GI bleed.  Denies fever/chills, vomiting, diarrhea, abdominal pain, hematochezia/melena, chest pain, shortness of breath, syncope, urinary symptoms, or any other complaints.   Past Medical History:  Diagnosis Date  . Arthritis   . CAD (coronary artery disease)    a. NSTEMI 12/12: EF 40-45%. AQT:MAUQJFH balloon PTCA + Promus DES x 1 to mid LAD;  b. 11/2012: Cath with [place] balloon PTCA  LAD for ISR to LAD, EF 55%;  c. 12/2012 Cath/PCI: LAD stents patent, 80 ost Diag (jailed)->PTCA, LCX/RCA patent.  . Essential hypertension   . GI bleed   . History of blood transfusion    was getting 4 units  . Hyperlipidemia   . Ischemic cardiomyopathy    a.  echo 08/06/11: dist ant wall, apical and septal and infero-apical HK, mild LVH, EF 40%, mild LAE, PASP 34, asc aorta mildly dilated, mild TR;  b.  Echo (3/13) showed  recovery of LV systolic function with EF 54-56%, grade I diastolic dysfunction, mild MR.   . Type 2 diabetes mellitus [facility])     Patient Active Problem List   Diagnosis Date Noted  . 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 Lower GI bleed 10/24/2019  . 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 Abdominal pain 11/17/2018  . Cardiac arrhythmia, unspecified 11/14/2017  . Hypomagnesemia 11/14/2017  . Anemia 11/14/2017  . Bleeding skin mole 11/14/2017  . OSA on CPAP 09/21/2017  . Obesity 01/23/2016  . PCP NOTES >>> 06/08/2015  . Anxiety and depression 05/31/2013  . Intermediate coronary syndrome ([place]) 12/22/2012  . Unstable angina ([place]) 12/16/2012  . Fatigue 04/26/2012  . Annual physical exam 10/28/2011  . Rash 10/28/2011  . Coronary Artery Disease 08/28/2011  . 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  . Palpitations 08/28/2011  . NSTEMI (non-ST elevated myocardial infarction) ([place]) 08/07/2011  . DJD (degenerative joint disease) 05/28/2011  . Poorly controlled type 2 diabetes mellitus with circulatory disorder ([place])   . Hyperlipidemia   . HTN (hypertension)     Past Surgical History:  Procedure Laterality Date  . ANTERIOR CERVICAL DECOMP/DISCECTOMY FUSION  in the 90s  . CHOLECYSTECTOMY  03/27/2019  . COLONOSCOPY WITH ESOPHAGOGASTRODUODENOSCOPY (EGD)  10/2019  . CORONARY ANGIOPLASTY  12/15/2012; 12/22/2012  . CORONARY ANGIOPLASTY WITH STENT PLACEMENT  07/2011   "1" (12/22/2012)  .  GASTRIC BYPASS  12/21/2017  . KNEE ARTHROSCOPY Left 12/31/2016  . LEFT HEART CATHETERIZATION WITH CORONARY ANGIOGRAM N/A 08/06/2011   Procedure: LEFT HEART CATHETERIZATION WITH CORONARY ANGIOGRAM;  Surgeon: Burnell Blanks, MD;  Location: [facility] CATH LAB;  Service: Cardiovascular;  Laterality: N/A;  . LEFT HEART CATHETERIZATION WITH CORONARY ANGIOGRAM N/A 12/15/2012   Procedure: LEFT HEART CATHETERIZATION WITH CORONARY ANGIOGRAM;  Surgeon: Sherren Mocha, MD;  Location: [facility] CATH LAB;  Service:  Cardiovascular;  Laterality: N/A;  . LEFT HEART CATHETERIZATION WITH CORONARY ANGIOGRAM N/A 12/22/2012   Procedure: LEFT HEART CATHETERIZATION WITH CORONARY ANGIOGRAM;  Surgeon: Sherren Mocha, MD;  Location: [facility] CATH LAB;  Service: Cardiovascular;  Laterality: N/A;  . PERCUTANEOUS CORONARY STENT INTERVENTION (PCI-S) Right 12/15/2012   Procedure: PERCUTANEOUS CORONARY STENT INTERVENTION (PCI-S);  Surgeon: Sherren Mocha, MD;  Location[facility] CATH LAB;  Service: Cardiovascular;  Laterality: Right;       Family History  Problem Relation Age of Onset  . Diabetes Brother   . Lung cancer Brother   . Diabetes Mother   . Coronary artery disease Father   . Heart attack Father 77  . Diabetes Sister   . Coronary artery disease Sister   . Colon cancer Neg Hx   . Prostate cancer Neg Hx   . Stroke Neg Hx   . Esophageal cancer Neg Hx     Social History   Tobacco Use  . Smoking status: Former Smoker    Packs/day: 1.00    Years: 25.00    Pack years: 25.00    Types: Cigarettes    Quit date: 04/12/1992    Years since quitting: 27.5  . Smokeless tobacco: Never Used  Substance Use Topics  . Alcohol use: Yes    Comment: 12/22/2012 "rarely maybe every 3-4 months, beer"  . Drug use: No    Home Medications Prior to Admission medications   Medication Sig Start Date End Date Taking? Authorizing Provider  aspirin EC 81 MG tablet Take 81 mg by mouth daily.    [provider]  atorvastatin (LIPITOR) 80 MG tablet Take 1 tablet (80 mg total) by mouth at bedtime. 09/18/19   Colon Branch, MD  carvedilol (COREG) 12.5 MG tablet TAKE 1 TABLET(12.5 MG) BY MOUTH TWICE DAILY WITH A MEAL 11/02/19   Colon Branch, MD  citalopram (CELEXA) 20 MG tablet Take 1 tablet (20 mg total) by mouth daily. 08/14/19   Colon Branch, MD  clotrimazole-betamethasone (LOTRISONE) cream Apply topically 2 (two) times daily. Patient taking differently: Apply topically 2 (two) times daily as needed.  05/01/16   Colon Branch, MD  dicyclomine  (BENTYL) 10 MG capsule TAKE 1 TO 2 CAPSULES(10 TO 20 MG) BY MOUTH THREE TIMES DAILY AS NEEDED FOR SPASMS 12/20/18   Colon Branch, MD  glucose blood test strip Check blood sugar 2-3 times daily 04/27/18   Colon Branch, MD  glucose monitoring kit (FREESTYLE) monitoring kit 1 each by Does not apply route as needed for other (Check sugar levels 3 times a day). 04/26/18   Colon Branch, MD  insulin NPH Human (NOVOLIN N) 100 UNIT/ML injection Inject into the skin. 15 units in morning, 15 units in evening     [provider]  Iron, Ferrous Sulfate, 325 (65 Fe) MG TABS Take 325 mg by mouth daily. 11/06/19   Cirigliano, Vito V, DO  lisinopril (ZESTRIL) 5 MG tablet Take 5 mg by mouth daily. 07/03/19   [provider]  magnesium oxide (  MAGNESIUM-OXIDE) 400 (241.3 Mg) MG tablet Take 1 tablet (400 mg total) by mouth 2 (two) times daily. 05/02/19   Colon Branch, MD  metFORMIN (GLUCOPHAGE) 1000 MG tablet Take 1,000 mg by mouth 2 (two) times daily. 04/29/19   [provider]  nitroGLYCERIN (NITROSTAT) 0.4 MG SL tablet Place 1 tablet (0.4 mg total) under the tongue every 5 (five) minutes as needed for chest pain. Patient not taking: Reported on 10/25/2019 11/03/18   Satira Sark, MD  pantoprazole (PROTONIX) 40 MG tablet Take 1 tablet (40 mg total) by mouth 2 (two) times daily before a meal. 10/31/19   Colon Branch, MD  Potassium 99 MG TABS Take 1 tablet by mouth daily.    [provider]  potassium chloride SA (KLOR-CON) 10 MEQ tablet Take 1 tablet (10 mEq total) by mouth 2 (two) times daily for 5 days. 11/06/19 11/11/19  Joy, Shawn C, PA-C  primidone (MYSOLINE) 50 MG tablet Take 1 tablet (50 mg total) by mouth 2 (two) times daily. 07/24/19   [name], [name], DO  sucralfate (CARAFATE) 1 GM/10ML suspension Take 10 mLs (1 g total) by mouth 4 (four) times daily. 11/01/19   Colon Branch, MD    Allergies    Trulicity [dulaglutide]  Review of Systems   Review of Systems  Constitutional: Negative for  chills, diaphoresis and fever.  Respiratory: Negative for shortness of breath.   Cardiovascular: Negative for chest pain.  Gastrointestinal: Negative for abdominal pain, blood in stool, diarrhea and vomiting.  Genitourinary: Negative for dysuria and hematuria.  Musculoskeletal: Negative for back pain.  Neurological: Positive for weakness and light-headedness. Negative for syncope and headaches.  All other systems reviewed and are negative.   Physical Exam Updated Vital Signs BP (!) 108/55 (BP Location: Left Arm)   Pulse (!) 51   Temp 98 F (36.7 C) (Oral)   Resp 18   SpO2 99%   Physical Exam Vitals and nursing note reviewed.  Constitutional:      General: He is not in acute distress.    Appearance: He is well-developed. He is not diaphoretic.  HENT:     Head: Normocephalic and atraumatic.     Mouth/Throat:     Mouth: Mucous membranes are moist.     Pharynx: Oropharynx is clear.  Eyes:     Conjunctiva/sclera: Conjunctivae normal.  Cardiovascular:     Rate and Rhythm: Normal rate and regular rhythm.     Pulses: Normal pulses.          Radial pulses are 2+ on the right side and 2+ on the left side.       Posterior tibial pulses are 2+ on the right side and 2+ on the left side.     Heart sounds: Normal heart sounds.     Comments: Tactile temperature in the extremities appropriate and equal bilaterally. Pulmonary:     Effort: Pulmonary effort is normal. No respiratory distress.     Breath sounds: Normal breath sounds.  Abdominal:     Palpations: Abdomen is soft.     Tenderness: There is no abdominal tenderness. There is no guarding.  Genitourinary:    Rectum: Guaiac result negative.     Comments: Rectal Exam:  No external hemorrhoids, fissures, or lesions noted.  No frank blood or melena. No stool burden.  No rectal tenderness. No foreign bodies noted.   RN, Sam, served as chaperone during the rectal exam. Musculoskeletal:     Cervical back: Neck supple.  Right  lower leg: No edema.     Left lower leg: No edema.  Lymphadenopathy:     Cervical: No cervical adenopathy.  Skin:    General: Skin is warm and dry.  Neurological:     Mental Status: He is alert and oriented to person, place, and time.  Psychiatric:        Mood and Affect: Mood and affect normal.        Speech: Speech normal.        Behavior: Behavior normal.     ED Results / Procedures / Treatments   Labs (all labs ordered are listed, but only abnormal results are displayed) Labs Reviewed  CBC WITH DIFFERENTIAL/PLATELET - Abnormal; Notable for the following components:      Result Value   RBC 3.45 (*)    Hemoglobin 10.4 (*)    HCT 31.5 (*)    All other components within normal limits  COMPREHENSIVE METABOLIC PANEL - Abnormal; Notable for the following components:   Potassium 3.4 (*)    Glucose, Bld 195 (*)    Calcium 8.8 (*)    Total Protein 6.3 (*)    ALT 45 (*)    All other components within normal limits  OCCULT BLOOD X 1 CARD TO LAB, STOOL    EKG EKG Interpretation  Date/Time:  Monday November 06 2019 15:24:36 EDT Ventricular Rate:  51 PR Interval:    QRS Duration: 148 QT Interval:  393 QTC Calculation: 362 R Axis:   -46 Text Interpretation: Normal sinus rhythm RBBB and LAFB LVH with secondary repolarization abnormality Baseline wander in lead(s) V2 V4 Reconfirmed by Madalyn Rob [phone]) on 11/06/2019 4:15:26 PM   Radiology No results found.  Procedures Procedures (including critical care time)  Medications Ordered in ED Medications  sodium chloride 0.9 % bolus 500 mL (0 mLs Intravenous Stopped 11/06/19 1631)    ED Course  I have reviewed the triage vital signs and the nursing notes.  Pertinent labs & imaging results that were available during my care of the patient were reviewed by me and considered in my medical decision making (see chart for details).  Clinical Course as of Nov 06 2323  Mon Nov 06, 2019  1516 Updated patient's wife via phone at  patient's request. She states patient has felt weak since his hospital discharge at the beginning of March.  No syncope or falls.   [SJ]  [facility] to give Dr. Bryan Lemma an update on the patient. They tried to call his office, but thinks he may be with a patient. Left my call back number.    [SJ]  [place] with Dr. Bryan Lemma and updated him on patient's presentation.  We also discussed patient's lab results.  He states he did not have anything further in mind for his work-up.  He can see the patient in close follow-up in the office.   [SJ]  2947 Patient states he "feels great."  He is ready to go home.  He has had no incidence of lightheadedness or dizziness during his time in the ED.   [SJ]    Clinical Course User Index [SJ] Joy, Shawn C, PA-C   MDM Rules/Calculators/A&P                      Patient presents for further evaluation due to lightheadedness and a hypotensive blood pressure reading during an office visit. Patient is nontoxic appearing, afebrile, not tachycardic, not tachypneic, not hypotensive, maintains excellent  SPO2 on room air, and is in no apparent distress.   I have reviewed the patient's chart to obtain more information.  Notes, labs, and other available information from [place] were reviewed.  Providers from [place] on lack of acute abnormalities on CTA abdomen/pelvis performed at [facility].  I do not have access to this imaging study or its interpretation, however.  I reviewed and interpreted the patient's labs and radiological studies. Lab results were reassuring overall with increase in his hemoglobin from 6 days ago.  Mild hypokalemia was noted and addressed. No acute abnormalities noted on EKG.  He did have some orthostatic changes, but these were addressed with IV fluids. Prior to discharge patient ambulated unassisted around the ED and without onset of symptoms. The patient was given instructions for home care as well as  return precautions. Patient voices understanding of these instructions, accepts the plan, and is comfortable with discharge.   Findings and plan of care discussed with Madalyn Rob, MD. Dr. Roslynn Amble personally evaluated and examined this patient.   Vitals:   11/06/19 1437 11/06/19 1628 11/06/19 1629  BP: (!) 108/55 (!) 114/54 (!) 114/54  Pulse: (!) 51 (!) 48 (!) 54  Resp: _0 Temp: 98 F (36.7 C)    TempSrc: Oral    SpO2: 99% 100% 100%       HPI from Frederik Pear, FNP and attested by Prescilla Sours, MD 10/23/19 from [facility]: "Mahkai Fangman is a very pleasant 69 y.o. male with a h/o DM2, CAD, cholecystectomy, and gastric bypass who was transferred from [facility] for GI bleed. Please see HPI from H&P for further details. 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 Received 2 units PRBCs and was transferred here. His hemoglobin was monitored and was stable close to 9. Gastroenterology was consulted and they proceeded with EGD and colonoscopy. Colonoscopy revealed diverticulosis in the sigmoid and descending colon, 2 polyps which were removed, preparation was felt to be poor and was recommended repeat colonoscopy at the time of EGD (in 2 months). 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 This was felt to be the primary source of bleeding. He was recommended to continue to hold Plavix for as long as safely can be done. He was started on PPI twice daily and will continue in the outpatient setting. He was recommended to have repeat EGD in 2 months to ensure resolution of ulcerations as he remains at risk for recurrent bleeding. He will follow-up with his cardiologist to decide for how long can Plavix be safely held and whether it is necessary to be resumed  at all given his cardiac procedure was around 2015. He was resumed on regular diet and tolerated well. He feels well and agrees with the discharge planning."   Final Clinical Impression(s) / ED Diagnoses Final diagnoses:  Lightheadedness    Rx / DC Orders ED Discharge Orders         Ordered    potassium chloride SA (KLOR-CON) 10 MEQ tablet  2 times daily     11/06/19 1715           Lorayne Bender, PA-C 11/06/19 2325    Lucrezia Starch, MD 11/07/19 [phone]

## 2019-11-06 NOTE — ED Notes (Signed)
From md office  C/o dizziness with mild abd pain and low bp

## 2019-11-06 NOTE — Discharge Instructions (Addendum)
Be sure to start taking the iron supplement prescribed by the GI specialist. Sure to stay well-hydrated by drinking plenty of fluids. Your potassium was a little bit lower than normal.  Take the potassium supplement, as prescribed.  Have this value retested by your primary care provider. Recommend follow-up with your primary care provider and GI specialist this week. Return to the emergency department for chest pain, shortness of breath, abdominal pain, passing out, dizziness, or any other major concerns.

## 2019-11-06 NOTE — ED Notes (Signed)
Ambulated patient in the hallway.  He stated that he feels a lot better.  Noted that his gait is not steady, he stated that it is normal for him.. EDP made aware.

## 2019-11-06 NOTE — ED Triage Notes (Signed)
Pt states he was being seen by GI for prescreen to procedures-was sent to ED due to low BP and feeling lightheaded-pt states he had recent admn for GI bleed where he "lost a lot of blood"-NAD-slow gait

## 2019-11-17 ENCOUNTER — Ambulatory Visit: Payer: PPO | Admitting: Cardiology

## 2019-11-17 ENCOUNTER — Encounter: Payer: Self-pay | Admitting: Cardiology

## 2019-11-17 VITALS — BP 134/69 | HR 94 | Temp 95.0°F | Ht 74.0 in | Wt 221.6 lb

## 2019-11-17 DIAGNOSIS — I25119 Atherosclerotic heart disease of native coronary artery with unspecified angina pectoris: Secondary | ICD-10-CM | POA: Diagnosis not present

## 2019-11-17 DIAGNOSIS — I429 Cardiomyopathy, unspecified: Secondary | ICD-10-CM | POA: Diagnosis not present

## 2019-11-17 DIAGNOSIS — E782 Mixed hyperlipidemia: Secondary | ICD-10-CM

## 2019-11-17 NOTE — Patient Instructions (Signed)
Medication Instructions:   Your physician recommends that you continue on your current medications as directed. Please refer to the Current Medication list given to you today.  Labwork:  NONE  Testing/Procedures: Your physician has requested that you have an echocardiogram in 4 months (July 2021) just before your next visit Echocardiography is a painless test that uses sound waves to create images of your heart. It provides your doctor with information about the size and shape of your heart and how well your heart's chambers and valves are working. This procedure takes approximately one hour. There are no restrictions for this procedure.  Follow-Up:  Your physician recommends that you schedule a follow-up appointment in: 4 months (office).  Any Other Special Instructions Will Be Listed Below (If Applicable).  If you need a refill on your cardiac medications before your next appointment, please call your pharmacy. 

## 2019-11-17 NOTE — Progress Notes (Signed)
Cardiology Office Note  Date: 11/17/2019   ID: Randy Castro., DOB 1951-05-31, MRN [id number]  PCP:  Colon Branch, MD  Cardiologist:  Rozann Lesches, MD Electrophysiologist:  None   Chief Complaint  Patient presents with  . Cardiac follow-up    History of Present Illness: Randy Castro. is a 69 y.o. male last assessed via telehealth encounter in November 2020.  He presents for a follow-up visit.  I reviewed interval records.  He was hospitalized at [facility] in late February to early March with a GI bleed and symptomatic anemia requiring 4 units of PRBCs.  He underwent EGD and colonoscopy.  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  He has planned follow-up EGD with GI locally.  He does not report any changes in stools recently, no obvious hematochezia.  He is no longer on Plavix.  Currently on Protonix and Carafate.  He was seen in the ER with relative hypotension and bradycardia, same day that he saw GI.  Etiology not entirely clear, could have been vagal component, he was mildly orthostatic.  No chest pain.  I did review his ECG which is outlined below.  He does not describe any recurring symptoms, heart rate is in the 80s on my check today and his blood pressure is stable.  He does have conduction system disease evident by ECG, no syncope.  He states that he is gradually feeling better, does not report any active angina at this time.  Current cardiac regimen is reviewed below.  I reviewed his recent lab work as outlined below.  Past Medical History:  Diagnosis Date  . Arthritis   . CAD (coronary artery disease)    a. NSTEMI 12/12: EF 40-45%. [id number] balloon PTCA + Promus DES x 1 to mid LAD;  b. 11/2012: Cath with Cutting balloon PTCA  LAD for ISR to LAD, EF 55%;  c. 12/2012 Cath/PCI: LAD stents patent, 80 ost Diag (jailed)->PTCA, LCX/RCA patent.  . Essential hypertension   . GI bleed   .  History of blood transfusion    was getting 4 units  . Hyperlipidemia   . Ischemic cardiomyopathy    a.  echo 08/06/11: dist ant wall, apical and septal and infero-apical HK, mild LVH, EF 40%, mild LAE, PASP 34, asc aorta mildly dilated, mild TR;  b.  Echo (3/13) showed recovery of LV systolic function with EF 05-39%, grade I diastolic dysfunction, mild MR.   . Type 2 diabetes mellitus ([place])     Past Surgical History:  Procedure Laterality Date  . ANTERIOR CERVICAL DECOMP/DISCECTOMY FUSION  in the 90s  . CHOLECYSTECTOMY  03/27/2019  . COLONOSCOPY WITH ESOPHAGOGASTRODUODENOSCOPY (EGD)  10/2019  . CORONARY ANGIOPLASTY  12/15/2012; 12/22/2012  . CORONARY ANGIOPLASTY WITH STENT PLACEMENT  07/2011   "1" (12/22/2012)  . GASTRIC BYPASS  12/21/2017  . KNEE ARTHROSCOPY Left 12/31/2016  . LEFT HEART CATHETERIZATION WITH CORONARY ANGIOGRAM N/A 08/06/2011   Procedure: LEFT HEART CATHETERIZATION WITH CORONARY ANGIOGRAM;  Surgeon: Burnell Blanks, MD;  Location[facility] C[facility] LAB;  Service: Cardiovascular;  Laterality: N/A;  . LEFT HEART CATHETERIZATION WITH CORONARY ANGIOGRAM N/A 12/15/2012   Procedure: LEFT HEART CATHETERIZATION WITH CORONARY ANGIOGRAM;  Surgeon: Sherren Mocha, MD;  Location: [facility] CATH LAB;  Service: Cardiovascular;  Laterality: N/A;  . LEFT HEART CATHETERIZATION WITH CORONARY ANGIOGRAM N/A 12/22/2012   Procedure: LEFT HEART CATHETERIZATION WITH CORONARY ANGIOGRAM;  Surgeon: Sherren Mocha, MD;  Location: [facility] CATH LAB;  Service: Cardiovascular;  Laterality: N/A;  . PERCUTANEOUS CORONARY STENT INTERVENTION (PCI-S) Right 12/15/2012   Procedure: PERCUTANEOUS CORONARY STENT INTERVENTION (PCI-S);  Surgeon: Sherren Mocha, MD;  Location: [facility] CATH LAB;  Service: Cardiovascular;  Laterality: Right;    Current Outpatient Medications  Medication Sig Dispense Refill  . aspirin EC 81 MG tablet Take 81 mg by mouth daily.    Marland Kitchen atorvastatin (LIPITOR) 80 MG tablet Take 1 tablet (80 mg total) by mouth at  bedtime. 90 tablet 1  . carvedilol (COREG) 12.5 MG tablet TAKE 1 TABLET(12.5 MG) BY MOUTH TWICE DAILY WITH A MEAL 180 tablet 1  . citalopram (CELEXA) 20 MG tablet Take 1 tablet (20 mg total) by mouth daily. 90 tablet 1  . clotrimazole-betamethasone (LOTRISONE) cream Apply topically 2 (two) times daily. (Patient taking differently: Apply topically 2 (two) times daily as needed. ) 30 g 0  . dicyclomine (BENTYL) 10 MG capsule TAKE 1 TO 2 CAPSULES(10 TO 20 MG) BY MOUTH THREE TIMES DAILY AS NEEDED FOR SPASMS 30 capsule 0  . glucose blood test strip Check blood sugar 2-3 times daily 100 each 12  . glucose monitoring kit (FREESTYLE) monitoring kit 1 each by Does not apply route as needed for other (Check sugar levels 3 times a day). 1 each 0  . insulin NPH Human (NOVOLIN N) 100 UNIT/ML injection Inject into the skin. 15 units in morning, 15 units in evening     . Iron, Ferrous Sulfate, 325 (65 Fe) MG TABS Take 325 mg by mouth daily. 30 tablet 3  . lisinopril (ZESTRIL) 5 MG tablet Take 5 mg by mouth daily.    . magnesium oxide (MAGNESIUM-OXIDE) 400 (241.3 Mg) MG tablet Take 1 tablet (400 mg total) by mouth 2 (two) times daily. 180 tablet 3  . metFORMIN (GLUCOPHAGE) 1000 MG tablet Take 1,000 mg by mouth 2 (two) times daily.    . nitroGLYCERIN (NITROSTAT) 0.4 MG SL tablet Place 1 tablet (0.4 mg total) under the tongue every 5 (five) minutes as needed for chest pain. 100 tablet 3  . pantoprazole (PROTONIX) 40 MG tablet Take 1 tablet (40 mg total) by mouth 2 (two) times daily before a meal. 60 tablet 6  . Potassium 99 MG TABS Take 1 tablet by mouth daily.    . primidone (MYSOLINE) 50 MG tablet Take 1 tablet (50 mg total) by mouth 2 (two) times daily. 180 tablet 2  . sucralfate (CARAFATE) 1 GM/10ML suspension Take 10 mLs (1 g total) by mouth 4 (four) times daily. 1200 mL 0  . potassium chloride SA (KLOR-CON) 10 MEQ tablet Take 1 tablet (10 mEq total) by mouth 2 (two) times daily for 5 days. 10 tablet 0   No  current facility-administered medications for this visit.   Allergies:  Trulicity [dulaglutide]   ROS:   Improving weakness.  No syncope.  Physical Exam: VS:  BP 134/69   Pulse 94   Temp (!) 95 F (35 C)   Ht 6' 2"  (1.88 m)   Wt 221 lb 9.6 oz (100.5 kg)   SpO2 96%   BMI 28.45 kg/m , BMI Body mass index is 28.45 kg/m.  Wt Readings from Last 3 Encounters:  11/17/19 221 lb 9.6 oz (100.5 kg)  11/06/19 216 lb (98 kg)  10/31/19 214 lb 4 oz (97.2 kg)    General: Patient appears comfortable at rest. HEENT: Conjunctiva and lids normal, wearing a mask. Neck: Supple, no elevated JVP or carotid bruits,  no thyromegaly. Lungs: Clear to auscultation, nonlabored breathing at rest. Cardiac: Regular rate and rhythm, no S3, soft systolic murmur, no pericardial rub. Abdomen: Soft, nontender, bowel sounds present. Extremities: No pitting edema, distal pulses 2+. Skin: Warm and dry. Musculoskeletal: No kyphosis. Neuropsychiatric: Alert and oriented x3, affect grossly appropriate.  ECG:  ECG dated 11/06/2019 personally reviewed and showed sinus bradycardia with sinus arrhythmia, right bundle branch block, left anterior fascicular block.  Recent Labwork: 05/04/2019: Magnesium 1.3 11/06/2019: ALT 45; AST 40; BUN 11; Creatinine, Ser 0.70; Hemoglobin 10.4; Platelets 347; Potassium 3.4; Sodium 141     Component Value Date/Time   CHOL 78 05/04/2019 0952   TRIG 104.0 05/04/2019 0952   HDL 35.70 (L) 05/04/2019 0952   CHOLHDL 2 05/04/2019 0952   VLDL 20.8 05/04/2019 0952   LDLCALC 21 05/04/2019 0952   LDLDIRECT 145.0 04/26/2018 1026    Other Studies Reviewed Today:  [facility] Myoview 12/15/2017:  There was no ST segment deviation noted during stress.  Findings consistent with larege prior inferior myocardial infarction.  This is a high risk study. Risked based on decreased LVEF. There is no current myocardium at jeopardy. Consider correlating LVEF with echocardiogram.  The left ventricular  ejection fraction is moderately decreased (30-44%).  Echocardiogram 01/25/2019: 1. The left ventricle has a visually estimated ejection fraction of approximately 45%. The cavity size was mildly dilated. Left ventricular diastolic Doppler parameters are consistent with impaired relaxation. Left ventricular diffuse hypokinesis. 2. The right ventricle has normal systolic function. The cavity was normal. There is no increase in right ventricular wall thickness. 3. The aortic valve is tricuspid. Mild aortic annular calcification noted. 4. The mitral valve is grossly normal. 5. The tricuspid valve is grossly normal. 6. There is mild dilatation of the aortic root. 7. Cannot exclude small PFO. 8. There is redundancy of the interatrial septum.  Assessment and Plan:  1.  CAD with history of previous LAD interventions, most recently cutting balloon management of in-stent restenosis in 2014 and subsequent angioplasty of a jailed diagonal branch.  He has been taken off Plavix in light of GI bleed as noted above, but continues on aspirin, Coreg, Lipitor, and lisinopril.  He has not had any recent angina symptoms or use nitroglycerin.  Continue observation.  I did review his recent ECG.  2.  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  We will obtain a follow-up study prior to his next visit.  3.  Mixed hyperlipidemia, continues on Lipitor.  Last LDL 21.  Medication Adjustments/Labs and Tests Ordered: Current medicines are reviewed at length with the patient today.  Concerns regarding medicines are outlined above.   Tests Ordered: Orders Placed This Encounter  Procedures  . ECHOCARDIOGRAM COMPLETE    Medication Changes: No orders of the defined types were placed in this encounter.   Disposition:  Follow up 4 months in the [place] office.  Signed, Satira Sark, MD, [facility] 11/17/2019 10:05 AM    [facility] [place]. [address]  [place], [place] [address] Phone: [phone]; Fax: [phone]

## 2019-11-30 ENCOUNTER — Ambulatory Visit (INDEPENDENT_AMBULATORY_CARE_PROVIDER_SITE_OTHER): Payer: PPO | Admitting: Internal Medicine

## 2019-11-30 ENCOUNTER — Encounter: Payer: Self-pay | Admitting: Internal Medicine

## 2019-11-30 ENCOUNTER — Other Ambulatory Visit: Payer: Self-pay

## 2019-11-30 VITALS — BP 143/48 | HR 47 | Temp 97.1°F | Resp 18 | Ht 74.0 in | Wt 219.4 lb

## 2019-11-30 DIAGNOSIS — I2510 Atherosclerotic heart disease of native coronary artery without angina pectoris: Secondary | ICD-10-CM | POA: Diagnosis not present

## 2019-11-30 DIAGNOSIS — D649 Anemia, unspecified: Secondary | ICD-10-CM | POA: Diagnosis not present

## 2019-11-30 DIAGNOSIS — I10 Essential (primary) hypertension: Secondary | ICD-10-CM

## 2019-11-30 LAB — COMPREHENSIVE METABOLIC PANEL
ALT: 42 U/L (ref 0–53)
AST: 33 U/L (ref 0–37)
Albumin: 3.9 g/dL (ref 3.5–5.2)
Alkaline Phosphatase: 80 U/L (ref 39–117)
BUN: 7 mg/dL (ref 6–23)
CO2: 30 mEq/L (ref 19–32)
Calcium: 8.7 mg/dL (ref 8.4–10.5)
Chloride: 103 mEq/L (ref 96–112)
Creatinine, Ser: 0.65 mg/dL (ref 0.40–1.50)
GFR: 122.07 mL/min (ref >60.00)
Glucose, Bld: 174 mg/dL — ABNORMAL HIGH (ref 70–99)
Potassium: 3.6 mEq/L (ref 3.5–5.1)
Sodium: 140 mEq/L (ref 135–145)
Total Bilirubin: 0.5 mg/dL (ref 0.2–1.2)
Total Protein: 5.7 g/dL — ABNORMAL LOW (ref 6.0–8.3)

## 2019-11-30 LAB — CBC WITH DIFFERENTIAL/PLATELET
Basophils Absolute: 0.1 10*3/uL (ref 0.0–0.1)
Basophils Relative: 1.2 % (ref 0.0–3.0)
Eosinophils Absolute: 0.3 10*3/uL (ref 0.0–0.7)
Eosinophils Relative: 4.4 % (ref 0.0–5.0)
HCT: 31.9 % — ABNORMAL LOW (ref 39.0–52.0)
Hemoglobin: 10.8 g/dL — ABNORMAL LOW (ref 13.0–17.0)
Lymphocytes Relative: 25.7 % (ref 12.0–46.0)
Lymphs Abs: 1.6 10*3/uL (ref 0.7–4.0)
MCHC: 33.7 g/dL (ref 30.0–36.0)
MCV: 84.9 fl (ref 78.0–100.0)
Monocytes Absolute: 0.5 10*3/uL (ref 0.1–1.0)
Monocytes Relative: 8.1 % (ref 3.0–12.0)
Neutro Abs: 3.8 10*3/uL (ref 1.4–7.7)
Neutrophils Relative %: 60.6 % (ref 43.0–77.0)
Platelets: 303 10*3/uL (ref 150.0–400.0)
RBC: 3.76 Mil/uL — ABNORMAL LOW (ref 4.22–5.81)
RDW: 14.4 % (ref 11.5–15.5)
WBC: 6.2 10*3/uL (ref 4.0–10.5)

## 2019-11-30 NOTE — Patient Instructions (Addendum)
Per our records you are due for an eye exam. Please contact your eye doctor to schedule an appointment. Please have them send copies of your office visit notes to [place]. Our fax number is [phone].  Please call anytime if you have concerns  GO TO THE LAB : Get the blood work     [place], [place] back for a checkup in 4 months

## 2019-11-30 NOTE — Assessment & Plan Note (Signed)
HTN: Seems well controlled, continue carvedilol, lisinopril, potassium. CAD:   Last visit with cardiology 11/17/2019: Was recommended to continue aspirin but not Plavix. Felt to be stable. He continues to do well, check a CMP GI bleed: Seen by GI 11/06/2019, pt was lightheaded & have a number of other symptoms, was sent to the ER for further evaluation. At the ER he was found to be stable, no admission needed, was discharged home. Has an appointment to see GI in few weeks, plan is is proceed with EGD and colonoscopy (due to previous suboptimal bowel prep at last colonoscopy) Anemia: Check a CBC. On iron supplements Preventive care: Had Covid shots RTC 4 months

## 2019-11-30 NOTE — Progress Notes (Signed)
Pre visit review using our clinic review tool, if applicable. No additional management support is needed unless otherwise documented below in the visit note. 

## 2019-11-30 NOTE — Progress Notes (Signed)
Subjective:    Patient ID: Randy Castro., male    DOB: 09-Feb-1951, 69 y.o.   MRN: [id number]  DOS:  11/30/2019 Type of visit - description: Routine checkup Since the last office visit, saw GI, went to the ER and saw cardiology. Records reviewed. Currently doing well, actually very active at home remodeling a bathroom without any problems or symptoms.  Review of Systems Denies chest pain or difficulty breathing No nausea, vomiting, diarrhea. No abdominal pain. Stools are normal color  Past Medical History:  Diagnosis Date  . Arthritis   . CAD (coronary artery disease)    a. NSTEMI 12/12: EF 40-45%. YQI:[id number] balloon PTCA + Promus DES x 1 to mid LAD;  b. 11/2012: Cath with Cutting balloon PTCA  LAD for ISR to LAD, EF 55%;  c. 12/2012 Cath/PCI: LAD stents patent, 80 ost Diag (jailed)->PTCA, LCX/RCA patent.  . Essential hypertension   . GI bleed   . History of blood transfusion    was getting 4 units  . Hyperlipidemia   . Ischemic cardiomyopathy    a.  echo 08/06/11: dist ant wall, apical and septal and infero-apical HK, mild LVH, EF 40%, mild LAE, PASP 34, asc aorta mildly dilated, mild TR;  b.  Echo (3/13) showed recovery of LV systolic function with EF 56-38%, grade I diastolic dysfunction, mild MR.   . Type 2 diabetes mellitus ([place])     Past Surgical History:  Procedure Laterality Date  . ANTERIOR CERVICAL DECOMP/DISCECTOMY FUSION  in the 90s  . CHOLECYSTECTOMY  03/27/2019  . COLONOSCOPY WITH ESOPHAGOGASTRODUODENOSCOPY (EGD)  10/2019  . CORONARY ANGIOPLASTY  12/15/2012; 12/22/2012  . CORONARY ANGIOPLASTY WITH STENT PLACEMENT  07/2011   "1" (12/22/2012)  . GASTRIC BYPASS  12/21/2017  . KNEE ARTHROSCOPY Left 12/31/2016  . LEFT HEART CATHETERIZATION WITH CORONARY ANGIOGRAM N/A 08/06/2011   Procedure: LEFT HEART CATHETERIZATION WITH CORONARY ANGIOGRAM;  Surgeon: Burnell Blanks, MD;  Location: [facility] CATH LAB;  Service: Cardiovascular;  Laterality: N/A;  . LEFT HEART  CATHETERIZATION WITH CORONARY ANGIOGRAM N/A 12/15/2012   Procedure: LEFT HEART CATHETERIZATION WITH CORONARY ANGIOGRAM;  Surgeon: Sherren Mocha, MD;  Location: [facility] CATH LAB;  Service: Cardiovascular;  Laterality: N/A;  . LEFT HEART CATHETERIZATION WITH CORONARY ANGIOGRAM N/A 12/22/2012   Procedure: LEFT HEART CATHETERIZATION WITH CORONARY ANGIOGRAM;  Surgeon: Sherren Mocha, MD;  Location: [facility] CATH LAB;  Service: Cardiovascular;  Laterality: N/A;  . PERCUTANEOUS CORONARY STENT INTERVENTION (PCI-S) Right 12/15/2012   Procedure: PERCUTANEOUS CORONARY STENT INTERVENTION (PCI-S);  Surgeon: Sherren Mocha, MD;  Location: [facility] CATH LAB;  Service: Cardiovascular;  Laterality: Right;    Allergies as of 11/30/2019      Reactions   Trulicity [dulaglutide] Nausea And Vomiting      Medication List       Accurate as of November 30, 2019  4:58 PM. If you have any questions, ask your nurse or doctor.        aspirin EC 81 MG tablet Take 81 mg by mouth daily.   atorvastatin 80 MG tablet Commonly known as: LIPITOR Take 1 tablet (80 mg total) by mouth at bedtime.   carvedilol 12.5 MG tablet Commonly known as: COREG TAKE 1 TABLET(12.5 MG) BY MOUTH TWICE DAILY WITH A MEAL   citalopram 20 MG tablet Commonly known as: CELEXA Take 1 tablet (20 mg total) by mouth daily.   clotrimazole-betamethasone cream Commonly known as: LOTRISONE Apply topically 2 (two) times daily.   dicyclomine 10 MG capsule Commonly known as:  BENTYL TAKE 1 TO 2 CAPSULES(10 TO 20 MG) BY MOUTH THREE TIMES DAILY AS NEEDED FOR SPASMS   glucose blood test strip Check blood sugar 2-3 times daily   glucose monitoring kit monitoring kit 1 each by Does not apply route as needed for other (Check sugar levels 3 times a day).   Iron (Ferrous Sulfate) 325 (65 Fe) MG Tabs Take 325 mg by mouth daily.   lisinopril 5 MG tablet Commonly known as: ZESTRIL Take 5 mg by mouth daily.   magnesium oxide 400 (241.3 Mg) MG tablet Commonly known as:  MAGnesium-Oxide Take 1 tablet (400 mg total) by mouth 2 (two) times daily.   metFORMIN 1000 MG tablet Commonly known as: GLUCOPHAGE Take 1,000 mg by mouth 2 (two) times daily.   nitroGLYCERIN 0.4 MG SL tablet Commonly known as: NITROSTAT Place 1 tablet (0.4 mg total) under the tongue every 5 (five) minutes as needed for chest pain.   NovoLIN N 100 UNIT/ML injection Generic drug: insulin NPH Human Inject into the skin. 15 units in morning, 15 units in evening   pantoprazole 40 MG tablet Commonly known as: PROTONIX Take 1 tablet (40 mg total) by mouth 2 (two) times daily before a meal.   Potassium 99 MG Tabs Take 1 tablet by mouth daily.   potassium chloride 10 MEQ tablet Commonly known as: KLOR-CON Take 1 tablet (10 mEq total) by mouth 2 (two) times daily for 5 days.   primidone 50 MG tablet Commonly known as: MYSOLINE Take 1 tablet (50 mg total) by mouth 2 (two) times daily.   sucralfate 1 GM/10ML suspension Commonly known as: Carafate Take 10 mLs (1 g total) by mouth 4 (four) times daily.          Objective:   Physical Exam BP (!) 143/48 (BP Location: Left Arm, Patient Position: Sitting, Cuff Size: Normal)   Pulse (!) 47   Temp (!) 97.1 F (36.2 C) (Temporal)   Resp 18   Ht 6' 2"  (1.88 m)   Wt 219 lb 6 oz (99.5 kg)   SpO2 99%   BMI 28.17 kg/m   General:   Well developed, NAD, BMI noted.  HEENT:  Normocephalic . Face symmetric, atraumatic Lungs:  CTA B Normal respiratory effort, no intercostal retractions, no accessory muscle use. Heart: RRR,  no murmur.  Abdomen:  Not distended, soft, non-tender. No rebound or rigidity.   Skin: Not pale. Not jaundice Lower extremities: no pretibial edema bilaterally  Neurologic:  alert & oriented X3.  Speech normal, gait appropriate for age and unassisted Psych--  Cognition and judgment appear intact.  Cooperative with normal attention span and concentration.  Behavior appropriate. No anxious or depressed  appearing.     Assessment     Assessment   DM -- insulin dependent, uncontrolled, with complications, CAD CHF.  Endo: [place] HTN Hyperlipidemia CAD, CHF, ischemic cardiomyopathy, PVCs (Holter 2019 show 13.6% burden) NSTEMI in 12/12, unstable angina in 4/14 and 5/14. He had DES to mid LAD in 12/12. EF was 40% with apical hypokinesis at the time of the MI. Repeat echo in 3/13 showed EF 55-60%. In 4/14, he was admitted with unstable angina and had 90% pLAD in-stent restenosis. This was treated with cutting balloon angioplasty. Readmitted, again with unstable angina, in 5/14. This time he had PTCA to 90% ostial D1 stenosis.  OSA dx ~08-2017, om Cpap Anxiety-depression Essential Tremor dx 2019  Skin cancer: Right leg, status post excision 2016; Dr Allyson Sabal Morbid obesity:gastric sleeve,  11-2017 Dr Raul Del 2-20 21 GI bleed, gastric ulcer, required transfusion  PLAN: HTN: Seems well controlled, continue carvedilol, lisinopril, potassium. CAD:   Last visit with cardiology 11/17/2019: Was recommended to continue aspirin but not Plavix. Felt to be stable. He continues to do well, check a CMP GI bleed: Seen by GI 11/06/2019, pt was lightheaded & have a number of other symptoms, was sent to the ER for further evaluation. At the ER he was found to be stable, no admission needed, was discharged home. Has an appointment to see GI in few weeks, plan is is proceed with EGD and colonoscopy (due to previous suboptimal bowel prep at last colonoscopy) Anemia: Check a CBC. On iron supplements Preventive care: Had Covid shots RTC 4 months    This visit occurred during the SARS-CoV-2 public health emergency.  Safety protocols were in place, including screening questions prior to the visit, additional usage of staff PPE, and extensive cleaning of exam room while observing appropriate contact time as indicated for disinfecting solutions.

## 2019-12-12 ENCOUNTER — Ambulatory Visit (AMBULATORY_SURGERY_CENTER): Payer: Self-pay

## 2019-12-12 ENCOUNTER — Other Ambulatory Visit: Payer: Self-pay

## 2019-12-12 VITALS — Temp 97.6°F | Ht 74.0 in | Wt 214.4 lb

## 2019-12-12 DIAGNOSIS — Z86010 Personal history of colonic polyps: Secondary | ICD-10-CM

## 2019-12-12 DIAGNOSIS — K289 Gastrojejunal ulcer, unspecified as acute or chronic, without hemorrhage or perforation: Secondary | ICD-10-CM

## 2019-12-12 NOTE — Progress Notes (Signed)
No allergies to soy or egg Pt is not on blood thinners or diet pills Denies issues with sedation/intubation Denies atrial flutter/fib Denies constipation   Emmi instructions given to pt  Pt is aware of Covid safety and care partner requirements.   Care Partner present w/temp 98.4  Pt confirms is NOT taking plavix at this time.

## 2019-12-26 ENCOUNTER — Telehealth: Payer: Self-pay | Admitting: Cardiology

## 2019-12-26 ENCOUNTER — Ambulatory Visit (AMBULATORY_SURGERY_CENTER): Payer: PPO | Admitting: Gastroenterology

## 2019-12-26 ENCOUNTER — Other Ambulatory Visit: Payer: Self-pay

## 2019-12-26 ENCOUNTER — Encounter: Payer: Self-pay | Admitting: Gastroenterology

## 2019-12-26 VITALS — BP 151/72 | HR 66 | Temp 97.3°F | Resp 15 | Ht 74.0 in | Wt 214.0 lb

## 2019-12-26 DIAGNOSIS — I4891 Unspecified atrial fibrillation: Secondary | ICD-10-CM | POA: Diagnosis not present

## 2019-12-26 DIAGNOSIS — K640 First degree hemorrhoids: Secondary | ICD-10-CM

## 2019-12-26 DIAGNOSIS — K319 Disease of stomach and duodenum, unspecified: Secondary | ICD-10-CM | POA: Diagnosis not present

## 2019-12-26 DIAGNOSIS — K289 Gastrojejunal ulcer, unspecified as acute or chronic, without hemorrhage or perforation: Secondary | ICD-10-CM

## 2019-12-26 DIAGNOSIS — D124 Benign neoplasm of descending colon: Secondary | ICD-10-CM | POA: Diagnosis not present

## 2019-12-26 DIAGNOSIS — I2510 Atherosclerotic heart disease of native coronary artery without angina pectoris: Secondary | ICD-10-CM | POA: Diagnosis not present

## 2019-12-26 DIAGNOSIS — Z9884 Bariatric surgery status: Secondary | ICD-10-CM

## 2019-12-26 DIAGNOSIS — D509 Iron deficiency anemia, unspecified: Secondary | ICD-10-CM

## 2019-12-26 DIAGNOSIS — K2970 Gastritis, unspecified, without bleeding: Secondary | ICD-10-CM

## 2019-12-26 DIAGNOSIS — K5730 Diverticulosis of large intestine without perforation or abscess without bleeding: Secondary | ICD-10-CM

## 2019-12-26 DIAGNOSIS — I10 Essential (primary) hypertension: Secondary | ICD-10-CM | POA: Diagnosis not present

## 2019-12-26 DIAGNOSIS — K219 Gastro-esophageal reflux disease without esophagitis: Secondary | ICD-10-CM | POA: Diagnosis not present

## 2019-12-26 DIAGNOSIS — E669 Obesity, unspecified: Secondary | ICD-10-CM | POA: Diagnosis not present

## 2019-12-26 DIAGNOSIS — Z86010 Personal history of colonic polyps: Secondary | ICD-10-CM

## 2019-12-26 DIAGNOSIS — E119 Type 2 diabetes mellitus without complications: Secondary | ICD-10-CM | POA: Diagnosis not present

## 2019-12-26 DIAGNOSIS — K2990 Gastroduodenitis, unspecified, without bleeding: Secondary | ICD-10-CM

## 2019-12-26 DIAGNOSIS — I499 Cardiac arrhythmia, unspecified: Secondary | ICD-10-CM

## 2019-12-26 MED ORDER — SODIUM CHLORIDE 0.9 % IV SOLN: 500.0000 mL | Freq: Once | INTRAVENOUS | Status: DC

## 2019-12-26 NOTE — Progress Notes (Signed)
12 lead EKG done per Dr. Bryan Lemma order. Dr. Bryan Lemma in touch with patients cardiologist.

## 2019-12-26 NOTE — Progress Notes (Signed)
Temp by JB Vitals by CW  Pt's states no medical or surgical changes since previsit or office visit.  

## 2019-12-26 NOTE — Progress Notes (Signed)
Report to PACU, RN, vss, BBS= Clear.  

## 2019-12-26 NOTE — Patient Instructions (Signed)
Handout on polyps, diverticulosis given.   Ok to stop Carafate. Dr. Bryan Lemma talking to your cardiologist.    YOU HAD AN ENDOSCOPIC PROCEDURE TODAY AT [facility]:   Refer to the procedure report that was given to you for any specific questions about what was found during the examination.  If the procedure report does not answer your questions, please call your gastroenterologist to clarify.  If you requested that your care partner not be given the details of your procedure findings, then the procedure report has been included in a sealed envelope for you to review at your convenience later.  YOU SHOULD EXPECT: Some feelings of bloating in the abdomen. Passage of more gas than usual.  Walking can help get rid of the air that was put into your GI tract during the procedure and reduce the bloating. If you had a lower endoscopy (such as a colonoscopy or flexible sigmoidoscopy) you may notice spotting of blood in your stool or on the toilet paper. If you underwent a bowel prep for your procedure, you may not have a normal bowel movement for a few days.  Please Note:  You might notice some irritation and congestion in your nose or some drainage.  This is from the oxygen used during your procedure.  There is no need for concern and it should clear up in a day or so.  SYMPTOMS TO REPORT IMMEDIATELY:   Following lower endoscopy (colonoscopy or flexible sigmoidoscopy):  Excessive amounts of blood in the stool  Significant tenderness or worsening of abdominal pains  Swelling of the abdomen that is new, acute  Fever of 100F or higher   Following upper endoscopy (EGD)  Vomiting of blood or coffee ground material  New chest pain or pain under the shoulder blades  Painful or persistently difficult swallowing  New shortness of breath  Fever of 100F or higher  Black, tarry-looking stools  For urgent or emergent issues, a gastroenterologist can be reached at any hour by calling [phone]  [phone]. Do not use MyChart messaging for urgent concerns.    DIET:  We do recommend a small meal at first, but then you may proceed to your regular diet.  Drink plenty of fluids but you should avoid alcoholic beverages for 24 hours.  ACTIVITY:  You should plan to take it easy for the rest of today and you should NOT DRIVE or use heavy machinery until tomorrow (because of the sedation medicines used during the test).    FOLLOW UP: Our staff will call the number listed on your records 48-72 hours following your procedure to check on you and address any questions or concerns that you may have regarding the information given to you following your procedure. If we do not reach you, we will leave a message.  We will attempt to reach you two times.  During this call, we will ask if you have developed any symptoms of COVID 19. If you develop any symptoms (ie: fever, flu-like symptoms, shortness of breath, cough etc.) before then, please call [phone].  If you test positive for Covid 19 in the 2 weeks post procedure, please call and report this information to [place].    If any biopsies were taken you will be contacted by phone or by letter within the next 1-3 weeks.  Please call us at [phone] if you have not heard about the biopsies in 3 weeks.    SIGNATURES/CONFIDENTIALITY: You and/or your care partner have signed paperwork which will  be entered into your electronic medical record.  These signatures attest to the fact that that the information above on your After Visit Summary has been reviewed and is understood.  Full responsibility of the confidentiality of this discharge information lies with you and/or your care-partner. 

## 2019-12-26 NOTE — Op Note (Signed)
[place] Patient Name: Randy Castro Procedure Date: 12/26/2019 10:29 AM MRN: [id number] Endoscopist: Gerrit Heck , MD Age: 69 Referring MD:  Date of Birth: 11/02/1950 Gender: Male Account #: [id number] Procedure:                Upper GI endoscopy Indications:              Iron deficiency anemia, Follow-up of gastrojejunal                            ulcer Medicines:                Monitored Anesthesia Care Procedure:                Pre-Anesthesia Assessment:                           - Prior to the procedure, a History and Physical                            was performed, and patient medications and                            allergies were reviewed. The patient's tolerance of                            previous anesthesia was also reviewed. The risks                            and benefits of the procedure and the sedation                            options and risks were discussed with the patient.                            All questions were answered, and informed consent                            was obtained. Prior Anticoagulants: The patient has                            taken no previous anticoagulant or antiplatelet                            agents except for aspirin. ASA Grade Assessment:                            III - A patient with severe systemic disease. After                            reviewing the risks and benefits, the patient was                            deemed in satisfactory condition to undergo the  procedure.                           After obtaining informed consent, the endoscope was                            passed under direct vision. Throughout the                            procedure, the patient's blood pressure, pulse, and                            oxygen saturations were monitored continuously. The                            Endoscope was introduced through the mouth, and                             advanced to the jejunum. The upper GI endoscopy was                            accomplished without difficulty. The patient                            tolerated the procedure well.                           During the procedure, the patient developed a few                            brief episodes of arrythmia, possibly PVCs. Runs                            were brief (10 seconds or less) and otherwise                            remained with a normal blood pressure and oxygen                            saturation. He converted into normal sinus rhythm                            without intervention. Scope In: Scope Out: Findings:                 Diffuse inflammation characterized by congestion                            (edema) was found in the middle third of the                            esophagus and in the lower third of the esophagus.                            Biopsies were taken with a cold  forceps for                            histology. Estimated blood loss was minimal.                           The upper third of the esophagus and                            gastroesophageal junction were normal.                           Evidence of a Roux-en-Y gastrojejunostomy was                            found. The gastrojejunal anastomosis was                            characterized by healthy appearing mucosa. This was                            traversed. There were 2 surgical staples noted.                            These were removed with cold forceps. The                            previously noted marginal ulcer has since healed.                            There was midl gastritis in the stomach pouch.                            Biopsies were taken with a cold forceps for                            Helicobacter pylori testing. Estimated blood loss                            was minimal.                           Normal mucosa was found in the jejunum. Biopsies                             for histology were taken with a cold forceps for                            evaluation of celiac disease. Estimated blood loss                            was minimal. Complications:            No immediate complications. Estimated Blood Loss:     Estimated blood loss was minimal. Impression:               -  Esophageal mucosal changes were present,                            including congestion (edema). Findings are                            suggestive of inflammation. Biopsied.                           - Normal upper third of esophagus and                            gastroesophageal junction.                           - Roux-en-Y gastrojejunostomy with gastrojejunal                            anastomosis characterized by healthy appearing                            mucosa. 2 surgical staples were removed. The                            previous marginal ulcer has since healed.                           - Mild gastritis of the gastric pouch. Biopsied.                           - Normal mucosa was found in the jejunum. Biopsied. Recommendation:           - Patient has a contact number available for                            emergencies. The signs and symptoms of potential                            delayed complications were discussed with the                            patient. Return to normal activities tomorrow.                            Written discharge instructions were provided to the                            patient.                           - Resume previous diet.                           - Continue present medications.                           - Ok to stop  Carafate given resolution of marginal                            ulcer.                           - Await pathology results.                           - Perform a colonoscopy today. Gerrit Heck, MD 12/26/2019 11:32:21 AM

## 2019-12-26 NOTE — Progress Notes (Signed)
Called to room to assist during endoscopic procedure.  Patient ID and intended procedure confirmed with present staff. Received instructions for my participation in the procedure from the performing physician.  

## 2019-12-26 NOTE — Telephone Encounter (Signed)
I received a phone call from Dr. Bryan Lemma regarding [name].  Patient underwent endoscopy today and reportedly had ectopy noted by telemetry during the procedure, was otherwise hemodynamically stable.  Post procedure ECG obtained, I had a copy faxed and reviewed it personally.  This tracing shows sinus rhythm with prolonged PR interval and IVCD, specifically left anterior fascicular block and also LVH.  I reviewed previous tracings and there are not marked differences.  Patient described to be stable and ready to go home.  No further cardiac intervention planned at this time.  Will scan ECG into the chart.

## 2019-12-26 NOTE — Op Note (Signed)
[place] Patient Name: Randy Castro Procedure Date: 12/26/2019 10:28 AM MRN: [id number] Endoscopist: Gerrit Heck , MD Age: 69 Referring MD:  Date of Birth: [birth year] Gender: Male Account #: [id number] Procedure:                Colonoscopy Indications:              High risk colon cancer surveillance: Personal                            history of non-advanced adenoma                           - Colonoscopy during recent admission at [facility] in                            09/2019 for GI bleed with 2 tubular adenomas, but                            due to suboptimal prep, was recommended to have                            short interval repeat colonoscopy.                           - Incidental - Iron deficiency anemia Medicines:                Monitored Anesthesia Care Procedure:                Pre-Anesthesia Assessment:                           - Prior to the procedure, a History and Physical                            was performed, and patient medications and                            allergies were reviewed. The patient's tolerance of                            previous anesthesia was also reviewed. The risks                            and benefits of the procedure and the sedation                            options and risks were discussed with the patient.                            All questions were answered, and informed consent                            was obtained. Prior Anticoagulants: The patient has  taken no previous anticoagulant or antiplatelet                            agents except for aspirin. ASA Grade Assessment:                            III - A patient with severe systemic disease. After                            reviewing the risks and benefits, the patient was                            deemed in satisfactory condition to undergo the                            procedure.                           After obtaining  informed consent, the colonoscope                            was passed under direct vision. Throughout the                            procedure, the patient's blood pressure, pulse, and                            oxygen saturations were monitored continuously. The                            Colonoscope was introduced through the anus and                            advanced to the the terminal ileum. The colonoscopy                            was performed without difficulty. The patient                            tolerated the procedure well. The quality of the                            bowel preparation was adequate. The terminal ileum,                            ileocecal valve, appendiceal orifice, and rectum                            were photographed. Scope In: 11:00:42 AM Scope Out: 11:19:51 AM Scope Withdrawal Time: 0 hours 12 minutes 46 seconds  Total Procedure Duration: 0 hours 19 minutes 9 seconds  Findings:                 The perianal and digital rectal examinations were  normal.                           A 5 mm polyp was found in the descending colon. The                            polyp was sessile with adherent mucus cap. The                            polyp was removed with a cold snare. Resection and                            retrieval were complete. Estimated blood loss was                            minimal.                           Multiple small and large-mouthed diverticula were                            found in the sigmoid colon.                           Non-bleeding internal hemorrhoids were found during                            retroflexion. The hemorrhoids were small.                           The exam was otherwise normal throughout the                            remainder of the colon.                           The terminal ileum appeared normal. Complications:            No immediate complications. Estimated Blood Loss:      Estimated blood loss was minimal. Impression:               - One 5 mm polyp in the descending colon, removed                            with a cold snare. Resected and retrieved.                           - Diverticulosis in the sigmoid colon.                           - Non-bleeding internal hemorrhoids.                           - The examined portion of the ileum was normal. Recommendation:           - Patient has a contact number available for  emergencies. The signs and symptoms of potential                            delayed complications were discussed with the                            patient. Return to normal activities tomorrow.                            Written discharge instructions were provided to the                            patient.                           - Resume previous diet.                           - Continue present medications.                           - Await pathology results.                           - Repeat colonoscopy in 3 - 5 years for                            surveillance based on pathology results.                           - Return to GI office at appointment to be                            scheduled.                           - Follow-up in the Cardiology Clinic for evaluation                            of a few brief runs of arrythmia (?PVCs), which was                            self limiting and without hemodynamic changes. Gerrit Heck, MD 12/26/2019 11:36:43 AM

## 2019-12-26 NOTE — Progress Notes (Signed)
Several runs of uniform PVCs noted during EGD.  Dr C notfied.  Pt would return to baseline rhythm on own.  Longest stretch being a whole strip.  BP taken multiple times and WNL.  Happened again at start of colon and pt went into sinus brady as unerlying rhythm.  Two doses of Roninul given to try to speed up the rate thru P wave but did not.  P waves seemed to go from biphasic to lengthening out like a second degree block or Junctional rhythm where the p wave was absent but this only happened with the PVC runs  Several strips saved and given to PACU RN to put in chart or pass along to patient.  DR C to get a 12 lead in PACU

## 2019-12-28 ENCOUNTER — Encounter: Payer: Self-pay | Admitting: Gastroenterology

## 2019-12-28 ENCOUNTER — Telehealth: Payer: Self-pay | Admitting: *Deleted

## 2019-12-28 NOTE — Telephone Encounter (Signed)
  Follow up Call-  Call back number 12/26/2019  Post procedure Call Back phone  # [phone]  Permission to leave phone message Yes  Some recent data might be hidden     Patient questions:  Do you have a fever, pain , or abdominal swelling? No. Pain Score  0 *  Have you tolerated food without any problems? Yes.    Have you been able to return to your normal activities? Yes.    Do you have any questions about your discharge instructions: Diet   No. Medications  No. Follow up visit  No.  Do you have questions or concerns about your Care? No.  Actions: * If pain score is 4 or above: No action needed, pain <4.  1. Have you developed a fever since your procedure? no  2.   Have you had an respiratory symptoms (SOB or cough) since your procedure? no  3.   Have you tested positive for COVID 19 since your procedure no  4.   Have you had any family members/close contacts diagnosed with the COVID 19 since your procedure?  no   If yes to any of these questions please route to Joylene John, RN and Erenest Rasher, RN

## 2020-02-08 ENCOUNTER — Encounter: Payer: Self-pay | Admitting: Internal Medicine

## 2020-02-13 ENCOUNTER — Other Ambulatory Visit: Payer: Self-pay | Admitting: Internal Medicine

## 2020-02-13 ENCOUNTER — Other Ambulatory Visit: Payer: Self-pay | Admitting: Gastroenterology

## 2020-02-22 ENCOUNTER — Telehealth: Payer: Self-pay | Admitting: Internal Medicine

## 2020-02-22 NOTE — Progress Notes (Signed)
  Chronic Care Management   Outreach Note  02/22/2020 Name: Randy Castro. MRN: [id number] DOB: Jan 05, 1951  Referred by: Colon Branch, MD Reason for referral : No chief complaint on file.   An unsuccessful telephone outreach was attempted today. The patient was referred to the pharmacist for assistance with care management and care coordination. This note is not being shared with the patient for the following reason: To respect privacy (The patient or proxy has requested that the information not be shared).  Follow Up Plan:   Earney Hamburg Upstream Scheduler

## 2020-02-22 NOTE — Progress Notes (Signed)
ERROR This note is not being shared with the patient for the following reason: To respect privacy (The patient or proxy has requested that the information not be shared).  Earney Hamburg Upstream Scheduler

## 2020-03-05 ENCOUNTER — Encounter: Payer: Self-pay | Admitting: Internal Medicine

## 2020-03-14 ENCOUNTER — Other Ambulatory Visit: Payer: Self-pay | Admitting: Internal Medicine

## 2020-03-21 ENCOUNTER — Ambulatory Visit (INDEPENDENT_AMBULATORY_CARE_PROVIDER_SITE_OTHER): Payer: PPO

## 2020-03-21 DIAGNOSIS — I429 Cardiomyopathy, unspecified: Secondary | ICD-10-CM

## 2020-03-21 LAB — ECHOCARDIOGRAM COMPLETE
AR max vel: 2.01 cm2
AV Area VTI: 2.17 cm2
AV Area mean vel: 1.99 cm2
AV Mean grad: 3.4 mmHg
AV Peak grad: 6.7 mmHg
Ao pk vel: 1.29 m/s
Area-P 1/2: 2.13 cm2
Calc EF: 48.8 %
MV M vel: 3.84 m/s
MV Peak grad: 59.1 mmHg
S' Lateral: 4.72 cm
Single Plane A2C EF: 48.6 %
Single Plane A4C EF: 48.6 %

## 2020-03-25 ENCOUNTER — Ambulatory Visit: Payer: PPO | Admitting: Cardiology

## 2020-03-25 NOTE — Progress Notes (Deleted)
Cardiology Office Note  Date: 03/25/2020   ID: Randy Foots., DOB June 23, 1951, MRN [id number]  PCP:  Colon Branch, MD  Cardiologist:  Rozann Lesches, MD Electrophysiologist:  None   No chief complaint on file.   History of Present Illness: Randy Duquette. is a 69 y.o. male last seen in March.  Recent follow-up echocardiogram shows improvement in LVEF to the range of 50 to 44%, mild diastolic dysfunction, and probable small PFO.  Past Medical History:  Diagnosis Date  . Anxiety   . Arthritis   . CAD (coronary artery disease)    a. NSTEMI 12/12: EF 40-45%. QKM:MNOTRRN balloon PTCA + Promus DES x 1 to mid LAD;  b. 11/2012: Cath with Cutting balloon PTCA  LAD for ISR to LAD, EF 55%;  c. 12/2012 Cath/PCI: LAD stents patent, 80 ost Diag (jailed)->PTCA, LCX/RCA patent.  . Essential hypertension   . GI bleed   . History of blood transfusion    was getting 4 units  . Hyperlipidemia   . Ischemic cardiomyopathy    a.  echo 08/06/11: dist ant wall, apical and septal and infero-apical HK, mild LVH, EF 40%, mild LAE, PASP 34, asc aorta mildly dilated, mild TR;  b.  Echo (3/13) showed recovery of LV systolic function with EF 16-57%, grade I diastolic dysfunction, mild MR.   . Myocardial infarction ([place])    twice with NSTEMI 2012  . Palpitations   . Type 2 diabetes mellitus ([place])     Past Surgical History:  Procedure Laterality Date  . ANTERIOR CERVICAL DECOMP/DISCECTOMY FUSION  in the 90s  . CHOLECYSTECTOMY  03/27/2019  . COLONOSCOPY  2021  . COLONOSCOPY WITH ESOPHAGOGASTRODUODENOSCOPY (EGD)  10/2019  . CORONARY ANGIOPLASTY  12/15/2012; 12/22/2012  . CORONARY ANGIOPLASTY WITH STENT PLACEMENT  07/2011   "1" (12/22/2012)  . GASTRIC BYPASS  12/21/2017  . KNEE ARTHROSCOPY Left 12/31/2016  . LEFT HEART CATHETERIZATION WITH CORONARY ANGIOGRAM N/A 08/06/2011   Procedure: LEFT HEART CATHETERIZATION WITH CORONARY ANGIOGRAM;  Surgeon: Burnell Blanks, MD;  Location[facility] LAB;   Service: Cardiovascular;  Laterality: N/A;  . LEFT HEART CATHETERIZATION WITH CORONARY ANGIOGRAM N/A 12/15/2012   Procedure: LEFT HEART CATHETERIZATION WITH CORONARY ANGIOGRAM;  Surgeon: Sherren Mocha, MD;  Location: [facility] CATH LAB;  Service: Cardiovascular;  Laterality: N/A;  . LEFT HEART CATHETERIZATION WITH CORONARY ANGIOGRAM N/A 12/22/2012   Procedure: LEFT HEART CATHETERIZATION WITH CORONARY ANGIOGRAM;  Surgeon: Sherren Mocha, MD;  Location: [facility] CATH LAB;  Service: Cardiovascular;  Laterality: N/A;  . PERCUTANEOUS CORONARY STENT INTERVENTION (PCI-S) Right 12/15/2012   Procedure: PERCUTANEOUS CORONARY STENT INTERVENTION (PCI-S);  Surgeon: Sherren Mocha, MD;  Location: [facility] CATH LAB;  Service: Cardiovascular;  Laterality: Right;    Current Outpatient Medications  Medication Sig Dispense Refill  . aspirin EC 81 MG tablet Take 81 mg by mouth daily.    Marland Kitchen atorvastatin (LIPITOR) 80 MG tablet TAKE 1 TABLET(80 MG) BY MOUTH AT BEDTIME 90 tablet 1  . carvedilol (COREG) 12.5 MG tablet TAKE 1 TABLET(12.5 MG) BY MOUTH TWICE DAILY WITH A MEAL 180 tablet 1  . citalopram (CELEXA) 20 MG tablet Take 1 tablet (20 mg total) by mouth daily. 90 tablet 1  . clotrimazole-betamethasone (LOTRISONE) cream Apply topically 2 (two) times daily. 30 g 0  . dicyclomine (BENTYL) 10 MG capsule TAKE 1 TO 2 CAPSULES(10 TO 20 MG) BY MOUTH THREE TIMES DAILY AS NEEDED FOR SPASMS 30 capsule 0  . FEROSUL 325 (65 Fe) MG  tablet TAKE 1 TABLET BY MOUTH DAILY 30 tablet 3  . glucose blood test strip Check blood sugar 2-3 times daily 100 each 12  . glucose monitoring kit (FREESTYLE) monitoring kit 1 each by Does not apply route as needed for other (Check sugar levels 3 times a day). 1 each 0  . insulin NPH Human (NOVOLIN N) 100 UNIT/ML injection Inject into the skin. 15 units in morning, 15 units in evening     . lisinopril (ZESTRIL) 5 MG tablet Take 5 mg by mouth daily.    . magnesium oxide (MAGNESIUM-OXIDE) 400 (241.3 Mg) MG tablet Take 1  tablet (400 mg total) by mouth 2 (two) times daily. 180 tablet 3  . metFORMIN (GLUCOPHAGE) 1000 MG tablet Take 1,000 mg by mouth 2 (two) times daily.    . nitroGLYCERIN (NITROSTAT) 0.4 MG SL tablet Place 1 tablet (0.4 mg total) under the tongue every 5 (five) minutes as needed for chest pain. 100 tablet 3  . pantoprazole (PROTONIX) 40 MG tablet Take 1 tablet (40 mg total) by mouth 2 (two) times daily before a meal. 60 tablet 6  . Potassium 99 MG TABS Take 1 tablet by mouth daily.    . potassium chloride SA (KLOR-CON) 10 MEQ tablet Take 1 tablet (10 mEq total) by mouth 2 (two) times daily for 5 days. 10 tablet 0  . primidone (MYSOLINE) 50 MG tablet Take 1 tablet (50 mg total) by mouth 2 (two) times daily. 180 tablet 2  . sucralfate (CARAFATE) 1 GM/10ML suspension Take 10 mLs (1 g total) by mouth 4 (four) times daily. 1200 mL 0   No current facility-administered medications for this visit.   Allergies:  Trulicity [dulaglutide]   Social History: The patient  reports that he quit smoking about 27 years ago. His smoking use included cigarettes. He has a 25.00 pack-year smoking history. He has never used smokeless tobacco. He reports current alcohol use. He reports that he does not use drugs.   Family History: The patient's family history includes Coronary artery disease in his father and sister; Diabetes in his brother, mother, and sister; Heart attack (age of onset: 89) in his father; Lung cancer in his brother.   ROS:  Please see the history of present illness. Otherwise, complete review of systems is positive for {NONE DEFAULTED:18576::"none"}.  All other systems are reviewed and negative.   Physical Exam: VS:  There were no vitals taken for this visit., BMI There is no height or weight on file to calculate BMI.  Wt Readings from Last 3 Encounters:  12/26/19 214 lb (97.1 kg)  12/12/19 214 lb 6.4 oz (97.3 kg)  11/30/19 219 lb 6 oz (99.5 kg)    General: Patient appears comfortable at  rest. HEENT: Conjunctiva and lids normal, oropharynx clear with moist mucosa. Neck: Supple, no elevated JVP or carotid bruits, no thyromegaly. Lungs: Clear to auscultation, nonlabored breathing at rest. Cardiac: Regular rate and rhythm, no S3 or significant systolic murmur, no pericardial rub. Abdomen: Soft, nontender, no hepatomegaly, bowel sounds present, no guarding or rebound. Extremities: No pitting edema, distal pulses 2+. Skin: Warm and dry. Musculoskeletal: No kyphosis. Neuropsychiatric: Alert and oriented x3, affect grossly appropriate.  ECG:  An ECG dated 12/26/2019 was personally reviewed today and demonstrated:  Sinus rhythm with prolonged PR interval, IVCD with LVH.  Recent Labwork: 05/04/2019: Magnesium 1.3 11/30/2019: ALT 42; AST 33; BUN 7; Creatinine, Ser 0.65; Hemoglobin 10.8; Platelets 303.0; Potassium 3.6; Sodium 140     Component Value  Date/Time   CHOL 78 05/04/2019 0952   TRIG 104.0 05/04/2019 0952   HDL 35.70 (L) 05/04/2019 0952   CHOLHDL 2 05/04/2019 0952   VLDL 20.8 05/04/2019 0952   LDLCALC 21 05/04/2019 0952   LDLDIRECT 145.0 04/26/2018 1026    Other Studies Reviewed Today:  [facility] Myoview 12/15/2017:  There was no ST segment deviation noted during stress.  Findings consistent with larege prior inferior myocardial infarction.  This is a high risk study. Risked based on decreased LVEF. There is no current myocardium at jeopardy. Consider correlating LVEF with echocardiogram.  The left ventricular ejection fraction is moderately decreased (30-44%).  Echocardiogram7/29/2021: 1. Left ventricular ejection fraction, by estimation, is 50 to 55%. The  left ventricle has low normal function. The left ventricle has no regional  wall motion abnormalities. Left ventricular diastolic parameters are  consistent with Grade I diastolic  dysfunction (impaired relaxation).  2. Right ventricular systolic function is normal. The right ventricular  size is normal.  There is normal pulmonary artery systolic pressure.  3. Left atrial size was severely dilated.  4. The mitral valve is normal in structure. Mild mitral valve  regurgitation. No evidence of mitral stenosis.  5. The aortic valve is tricuspid. Aortic valve regurgitation is not  visualized. No aortic stenosis is present.  6. Aortic dilatation noted. There is mild dilatation of the aortic root  measuring 41 mm.  7. The inferior vena cava is normal in size with greater than 50%  respiratory variability, suggesting right atrial pressure of 3 mmHg.  8. Probable small PFO with left to right shunt.   Assessment and Plan:    Medication Adjustments/Labs and Tests Ordered: Current medicines are reviewed at length with the patient today.  Concerns regarding medicines are outlined above.   Tests Ordered: No orders of the defined types were placed in this encounter.   Medication Changes: No orders of the defined types were placed in this encounter.   Disposition:  Follow up {follow up:15908}  Signed, Satira Sark, MD, [facility] 03/25/2020 12:37 PM    [facility] [place], [place], [place] [address] Phone: [phone]; Fax: [phone]

## 2020-04-01 ENCOUNTER — Ambulatory Visit (INDEPENDENT_AMBULATORY_CARE_PROVIDER_SITE_OTHER): Payer: PPO | Admitting: Internal Medicine

## 2020-04-01 ENCOUNTER — Other Ambulatory Visit: Payer: Self-pay

## 2020-04-01 ENCOUNTER — Encounter: Payer: Self-pay | Admitting: Internal Medicine

## 2020-04-01 VITALS — BP 158/71 | HR 50 | Temp 98.4°F | Resp 12 | Ht 74.0 in | Wt 216.4 lb

## 2020-04-01 DIAGNOSIS — I10 Essential (primary) hypertension: Secondary | ICD-10-CM | POA: Diagnosis not present

## 2020-04-01 DIAGNOSIS — E118 Type 2 diabetes mellitus with unspecified complications: Secondary | ICD-10-CM | POA: Diagnosis not present

## 2020-04-01 DIAGNOSIS — E7800 Pure hypercholesterolemia, unspecified: Secondary | ICD-10-CM

## 2020-04-01 DIAGNOSIS — D649 Anemia, unspecified: Secondary | ICD-10-CM | POA: Diagnosis not present

## 2020-04-01 LAB — CBC WITH DIFFERENTIAL/PLATELET
Basophils Absolute: 0.1 10*3/uL (ref 0.0–0.1)
Basophils Relative: 0.9 % (ref 0.0–3.0)
Eosinophils Absolute: 0.3 10*3/uL (ref 0.0–0.7)
Eosinophils Relative: 4.5 % (ref 0.0–5.0)
HCT: 35.1 % — ABNORMAL LOW (ref 39.0–52.0)
Hemoglobin: 11.8 g/dL — ABNORMAL LOW (ref 13.0–17.0)
Lymphocytes Relative: 25.3 % (ref 12.0–46.0)
Lymphs Abs: 1.6 10*3/uL (ref 0.7–4.0)
MCHC: 33.8 g/dL (ref 30.0–36.0)
MCV: 84.9 fl (ref 78.0–100.0)
Monocytes Absolute: 0.5 10*3/uL (ref 0.1–1.0)
Monocytes Relative: 7.4 % (ref 3.0–12.0)
Neutro Abs: 4 10*3/uL (ref 1.4–7.7)
Neutrophils Relative %: 61.9 % (ref 43.0–77.0)
Platelets: 246 10*3/uL (ref 150.0–400.0)
RBC: 4.13 Mil/uL — ABNORMAL LOW (ref 4.22–5.81)
RDW: 15.6 % — ABNORMAL HIGH (ref 11.5–15.5)
WBC: 6.5 10*3/uL (ref 4.0–10.5)

## 2020-04-01 LAB — LIPID PANEL
Cholesterol: 78 mg/dL (ref 0–200)
HDL: 32.8 mg/dL — ABNORMAL LOW (ref >39.00)
LDL Cholesterol: 24 mg/dL (ref 0–99)
NonHDL: 44.85
Total CHOL/HDL Ratio: 2
Triglycerides: 106 mg/dL (ref 0.0–149.0)
VLDL: 21.2 mg/dL (ref 0.0–40.0)

## 2020-04-01 LAB — HEMOGLOBIN A1C: Hgb A1c MFr Bld: 9.7 % — ABNORMAL HIGH (ref 4.6–6.5)

## 2020-04-01 LAB — IRON: Iron: 90 ug/dL (ref 42–165)

## 2020-04-01 LAB — FERRITIN: Ferritin: 66.3 ng/mL (ref 22.0–322.0)

## 2020-04-01 MED ORDER — ONETOUCH ULTRA VI STRP: ORAL_STRIP | 1 refills | Status: DC

## 2020-04-01 MED ORDER — METFORMIN HCL 1000 MG PO TABS: 1000.0000 mg | ORAL_TABLET | Freq: Two times a day (BID) | ORAL | 1 refills | Status: DC

## 2020-04-01 MED ORDER — LOSARTAN POTASSIUM 50 MG PO TABS
50.0000 mg | ORAL_TABLET | Freq: Every day | ORAL | 0 refills | Status: DC
Start: 2020-04-01 — End: 2020-04-02

## 2020-04-01 NOTE — Progress Notes (Signed)
Subjective:    Patient ID: Randy Foots., male    DOB: [birth year], 69 y.o.   MRN: [id number]  DOS:  04/01/2020 Type of visit - description: Follow-up Today we talk about diabetes, hypertension, GI symptoms.  He also reports dizziness on and off, ongoing for a while.  Symptoms triggered by standing up or walking.  No associated nausea, sweats, chest pain, palpitations. Denies any stroke symptoms such as headache, diplopia or slurred speech. No lower extremity paresthesias.   BP Readings from Last 3 Encounters:  04/01/20 (!) 158/71  12/26/19 (!) 151/72  11/30/19 (!) 143/48    Review of Systems Denies nausea or vomiting No dysphagia or odynophagia  Past Medical History:  Diagnosis Date   Anxiety    Arthritis    CAD (coronary artery disease)    a. NSTEMI 12/12: EF 40-45%. VZD:GLOVFIE balloon PTCA + Promus DES x 1 to mid LAD;  b. 11/2012: Cath with Cutting balloon PTCA  LAD for ISR to LAD, EF 55%;  c. 12/2012 Cath/PCI: LAD stents patent, 80 ost Diag (jailed)->PTCA, LCX/RCA patent.   Essential hypertension    GI bleed    History of blood transfusion    was getting 4 units   Hyperlipidemia    Ischemic cardiomyopathy    a.  echo 08/06/11: dist ant wall, apical and septal and infero-apical HK, mild LVH, EF 40%, mild LAE, PASP 34, asc aorta mildly dilated, mild TR;  b.  Echo (3/13) showed recovery of LV systolic function with EF 33-29%, grade I diastolic dysfunction, mild MR.    Myocardial infarction [facility])    twice with NSTEMI 2012   Palpitations    Type 2 diabetes mellitus [facility])     Past Surgical History:  Procedure Laterality Date   ANTERIOR CERVICAL DECOMP/DISCECTOMY FUSION  in the 90s   CHOLECYSTECTOMY  03/27/2019   COLONOSCOPY  2021   COLONOSCOPY WITH ESOPHAGOGASTRODUODENOSCOPY (EGD)  10/2019   CORONARY ANGIOPLASTY  12/15/2012; 12/22/2012   CORONARY ANGIOPLASTY WITH STENT PLACEMENT  07/2011   "1" (12/22/2012)   GASTRIC BYPASS  12/21/2017   KNEE  ARTHROSCOPY Left 12/31/2016   LEFT HEART CATHETERIZATION WITH CORONARY ANGIOGRAM N/A 08/06/2011   Procedure: LEFT HEART CATHETERIZATION WITH CORONARY ANGIOGRAM;  Surgeon: Burnell Blanks, MD;  Location: [facility] CATH LAB;  Service: Cardiovascular;  Laterality: N/A;   LEFT HEART CATHETERIZATION WITH CORONARY ANGIOGRAM N/A 12/15/2012   Procedure: LEFT HEART CATHETERIZATION WITH CORONARY ANGIOGRAM;  Surgeon: Sherren Mocha, MD;  Location: [facility] CATH LAB;  Service: Cardiovascular;  Laterality: N/A;   LEFT HEART CATHETERIZATION WITH CORONARY ANGIOGRAM N/A 12/22/2012   Procedure: LEFT HEART CATHETERIZATION WITH CORONARY ANGIOGRAM;  Surgeon: Sherren Mocha, MD;  Location: [facility] CATH LAB;  Service: Cardiovascular;  Laterality: N/A;   PERCUTANEOUS CORONARY STENT INTERVENTION (PCI-S) Right 12/15/2012   Procedure: PERCUTANEOUS CORONARY STENT INTERVENTION (PCI-S);  Surgeon: Sherren Mocha, MD;  Location: [facility] CATH LAB;  Service: Cardiovascular;  Laterality: Right;    Allergies as of 04/01/2020      Reactions   Trulicity [dulaglutide] Nausea And Vomiting      Medication List       Accurate as of April 01, 2020  8:49 PM. If you have any questions, ask your nurse or doctor.        STOP taking these medications   lisinopril 5 MG tablet Commonly known as: ZESTRIL Stopped by: Kathlene November, MD   sucralfate 1 GM/10ML suspension Commonly known as: Carafate Stopped by: Kathlene November, MD  TAKE these medications   aspirin EC 81 MG tablet Take 81 mg by mouth daily.   atorvastatin 80 MG tablet Commonly known as: LIPITOR TAKE 1 TABLET(80 MG) BY MOUTH AT BEDTIME   carvedilol 12.5 MG tablet Commonly known as: COREG TAKE 1 TABLET(12.5 MG) BY MOUTH TWICE DAILY WITH A MEAL   citalopram 20 MG tablet Commonly known as: CELEXA Take 1 tablet (20 mg total) by mouth daily.   clotrimazole-betamethasone cream Commonly known as: LOTRISONE Apply topically 2 (two) times daily.   dicyclomine 10 MG capsule Commonly known  as: BENTYL TAKE 1 TO 2 CAPSULES(10 TO 20 MG) BY MOUTH THREE TIMES DAILY AS NEEDED FOR SPASMS   FeroSul 325 (65 FE) MG tablet Generic drug: ferrous sulfate TAKE 1 TABLET BY MOUTH DAILY   losartan 50 MG tablet Commonly known as: COZAAR Take 1 tablet (50 mg total) by mouth daily. Started by: Kathlene November, MD   magnesium oxide 400 (241.3 Mg) MG tablet Commonly known as: MAGnesium-Oxide Take 1 tablet (400 mg total) by mouth 2 (two) times daily.   metFORMIN 1000 MG tablet Commonly known as: GLUCOPHAGE Take 1 tablet (1,000 mg total) by mouth 2 (two) times daily.   nitroGLYCERIN 0.4 MG SL tablet Commonly known as: NITROSTAT Place 1 tablet (0.4 mg total) under the tongue every 5 (five) minutes as needed for chest pain.   NovoLIN N 100 UNIT/ML injection Generic drug: insulin NPH Human Inject into the skin. 15 units in morning, 15 units in evening   ONE TOUCH ULTRA 2 w/Device Kit by Does not apply route. What changed: Another medication with the same name was removed. Continue taking this medication, and follow the directions you see here. Changed by: Kathlene November, MD   OneTouch Ultra test strip Generic drug: glucose blood Check blood sugar 2-3 times daily.  Dx Code: E11.9 What changed:   how much to take  how to take this  when to take this  reasons to take this  additional instructions  Another medication with the same name was removed. Continue taking this medication, and follow the directions you see here. Changed by: Kathlene November, MD   pantoprazole 40 MG tablet Commonly known as: PROTONIX Take 1 tablet (40 mg total) by mouth 2 (two) times daily before a meal.   Potassium 99 MG Tabs Take 1 tablet by mouth daily.   potassium chloride 10 MEQ tablet Commonly known as: KLOR-CON Take 1 tablet (10 mEq total) by mouth 2 (two) times daily for 5 days.   primidone 50 MG tablet Commonly known as: MYSOLINE Take 1 tablet (50 mg total) by mouth 2 (two) times daily.            Objective:   Physical Exam BP (!) 158/71 (BP Location: Right Arm, Cuff Size: Normal)    Pulse (!) 50    Temp 98.4 F (36.9 C) (Oral)    Resp 12    Ht 6' 2"  (1.88 m)    Wt 216 lb 6.4 oz (98.2 kg)    SpO2 99%    BMI 27.78 kg/m  General:   Well developed, NAD, BMI noted. HEENT:  Normocephalic . Face symmetric, atraumatic Lungs:  CTA B Normal respiratory effort, no intercostal retractions, no accessory muscle use. Heart: RRR,  no murmur.  Lower extremities: no pretibial edema bilaterally  Skin: Not pale. Not jaundice Neurologic:  alert & oriented X3.  Speech normal, gait appropriate for age and unassisted Psych--  Cognition and judgment appear intact.  Cooperative  with normal attention span and concentration.  Behavior appropriate. No anxious or depressed appearing.      Assessment     Assessment   DM : insulin dependent, uncontrolled, w/ CAD CHF. Intol Trulicity, used to see [facility] until 08/2019 HTN Hyperlipidemia CAD, CHF, ischemic cardiomyopathy, PVCs (Holter 2019 show 13.6% burden) NSTEMI in 12/12 (DES to LAD), EF 40%, unstable angina in 4/14 and 5/14 . Repeat echo in 3/13 showed EF 55-60%. 4/14 >> unstable angina and had 90% pLAD in-stent restenosis (Rxcutting balloon angioplasty). Readmitted, again with unstable angina, in 5/14. This time he had PTCA to 90% ostial D1 stenosis.  OSA dx ~08-2017, om Cpap Anxiety-depression Essential Tremor dx 2019  Skin cancer: Right leg, status post excision 2016; Dr Allyson Sabal Morbid obesity:gastric sleeve, 11-2017 Dr Raul Del 2-20 21 GI bleed, gastric ulcer, required transfusion  PLAN: DM: Last w/ endo @ [place] 08-2019.  They were under the impression that the patient was taking 70/30 insulin but he takes NPH 15 units B.I.D. and Metformin. Does not like to go back to [facility].   Plan: RF Metformin, continue NPH as above, check A1c, consider refer to our local endocrinologist noting that he recently is eating better and had to scale down NPH dose due  to hypoglycemia. HTN: Controlled?  On carvedilol, lisinopril.  Last BMP satisfactory.  Plan: Stop lisinopril 5 mg, start losartan 50 mg, BMP in 2 weeks GI bleed, anemia: Had recent EGD and colonoscopy, check a CBC ferritin. Dizziness: Chronic, etiology not completely clear, not orthostatic per vital signs, denies neuropathy or red flags type of symptoms, s/e from primidone?Deberah Castle on RTC, PT referral?. RTC labs 2 weeks RTC office visit 3 months   This visit occurred during the SARS-CoV-2 public health emergency.  Safety protocols were in place, including screening questions prior to the visit, additional usage of staff PPE, and extensive cleaning of exam room while observing appropriate contact time as indicated for disinfecting solutions.

## 2020-04-01 NOTE — Patient Instructions (Addendum)
Stop lisinopril 5 mg   Losartan 50 mg.  Check the  blood pressure 2 or 3 times a week BP GOAL is between 110/65 and  135/85. If it is consistently higher or lower, let me know    GO TO THE LAB : Get the blood work     [place], [place] back for blood work in 2 weeks  Come back for a checkup in 3 months

## 2020-04-01 NOTE — Assessment & Plan Note (Signed)
DM: Last w/ endo @ [facility] 08-2019.  They were under the impression that the patient was taking 70/30 insulin but he takes NPH 15 units B.I.D. and Metformin. Does not like to go back to [facility].   Plan: RF Metformin, continue NPH as above, check A1c, consider refer to our local endocrinologist noting that he recently is eating better and had to scale down NPH dose due to hypoglycemia. HTN: Controlled?  On carvedilol, lisinopril.  Last BMP satisfactory.  Plan: Stop lisinopril 5 mg, start losartan 50 mg, BMP in 2 weeks GI bleed, anemia: Had recent EGD and colonoscopy, check a CBC ferritin. Dizziness: Chronic, etiology not completely clear, not orthostatic per vital signs, denies neuropathy or red flags type of symptoms, s/e from primidone?Randy Castro on RTC, PT referral?. RTC labs 2 weeks RTC office visit 3 months

## 2020-04-02 ENCOUNTER — Encounter: Payer: Self-pay | Admitting: Internal Medicine

## 2020-04-02 ENCOUNTER — Other Ambulatory Visit: Payer: Self-pay | Admitting: Internal Medicine

## 2020-04-04 ENCOUNTER — Other Ambulatory Visit: Payer: Self-pay | Admitting: *Deleted

## 2020-04-04 DIAGNOSIS — E118 Type 2 diabetes mellitus with unspecified complications: Secondary | ICD-10-CM

## 2020-04-04 NOTE — Progress Notes (Signed)
amb  

## 2020-04-05 ENCOUNTER — Telehealth: Payer: Self-pay | Admitting: *Deleted

## 2020-04-05 DIAGNOSIS — I10 Essential (primary) hypertension: Secondary | ICD-10-CM

## 2020-04-05 NOTE — Telephone Encounter (Signed)
Pt has lab appt 04/15/20 but I do not see any future orders in Epic.  Please place orders if appropriate.

## 2020-04-08 NOTE — Telephone Encounter (Signed)
BMP ordered

## 2020-04-08 NOTE — Addendum Note (Signed)
Addended byDamita Dunnings D on: 04/08/2020 07:51 AM   Modules accepted: Orders

## 2020-04-09 ENCOUNTER — Other Ambulatory Visit: Payer: Self-pay | Admitting: Internal Medicine

## 2020-04-15 ENCOUNTER — Other Ambulatory Visit (INDEPENDENT_AMBULATORY_CARE_PROVIDER_SITE_OTHER): Payer: PPO

## 2020-04-15 ENCOUNTER — Other Ambulatory Visit: Payer: Self-pay

## 2020-04-15 DIAGNOSIS — I10 Essential (primary) hypertension: Secondary | ICD-10-CM

## 2020-04-15 LAB — BASIC METABOLIC PANEL
BUN: 9 mg/dL (ref 6–23)
CO2: 29 mEq/L (ref 19–32)
Calcium: 8.8 mg/dL (ref 8.4–10.5)
Chloride: 101 mEq/L (ref 96–112)
Creatinine, Ser: 0.66 mg/dL (ref 0.40–1.50)
GFR: 119.8 mL/min (ref >60.00)
Glucose, Bld: 196 mg/dL — ABNORMAL HIGH (ref 70–99)
Potassium: 4.2 mEq/L (ref 3.5–5.1)
Sodium: 138 mEq/L (ref 135–145)

## 2020-04-18 NOTE — Progress Notes (Signed)
Assessment/Plan:    1.  Essential Tremor  -Increase primidone, 50 mg, 3 in the AM, 2 in the PM  2.  F/u 6-8 months  Subjective:   Randy Castro. was seen today in follow up for essential tremor.  My previous records were reviewed prior to todays visit. Wife supplements history.  Pt denies falls.  Pt with some lightheadedness, but no near syncope.  No hallucinations.  Mood has been good.  Saw primary care on August 9.  Those notes are reviewed.  Had GI bleed in February, 2021 from gastric ulcer that required transfusion. In regards to tremor, he reports it is "some better.  I don't shake real bad but I still can't eat soup."  Still trouble with fine motor coordination.  No SE with med.  Current prescribed movement disorder medications: Primidone, 50 mg twice daily    ALLERGIES:   Allergies  Allergen Reactions  . Trulicity [Dulaglutide] Nausea And Vomiting    CURRENT MEDICATIONS:  Outpatient Encounter Medications as of 04/22/2020  Medication Sig  . aspirin EC 81 MG tablet Take 81 mg by mouth daily.  Marland Kitchen atorvastatin (LIPITOR) 80 MG tablet TAKE 1 TABLET(80 MG) BY MOUTH AT BEDTIME  . Blood Glucose Monitoring Suppl (ONE TOUCH ULTRA 2) w/Device KIT by Does not apply route.  . carvedilol (COREG) 12.5 MG tablet Take 1 tablet (12.5 mg total) by mouth 2 (two) times daily with a meal.  . citalopram (CELEXA) 20 MG tablet Take 1 tablet (20 mg total) by mouth daily.  . clotrimazole-betamethasone (LOTRISONE) cream Apply topically 2 (two) times daily.  . FEROSUL 325 (65 Fe) MG tablet TAKE 1 TABLET BY MOUTH DAILY  . glucose blood (ONETOUCH ULTRA) test strip Check blood sugar 2-3 times daily.  Dx Code: E11.9  . insulin NPH Human (NOVOLIN N) 100 UNIT/ML injection Inject into the skin. 15 units in morning, 15 units in evening   . losartan (COZAAR) 50 MG tablet TAKE 1 TABLET BY MOUTH DAILY  . magnesium oxide (MAGNESIUM-OXIDE) 400 (241.3 Mg) MG tablet Take 1 tablet (400 mg total) by mouth 2 (two)  times daily.  . metFORMIN (GLUCOPHAGE) 1000 MG tablet Take 1 tablet (1,000 mg total) by mouth 2 (two) times daily.  . nitroGLYCERIN (NITROSTAT) 0.4 MG SL tablet Place 1 tablet (0.4 mg total) under the tongue every 5 (five) minutes as needed for chest pain.  . pantoprazole (PROTONIX) 40 MG tablet Take 1 tablet (40 mg total) by mouth 2 (two) times daily before a meal.  . Potassium 99 MG TABS Take 1 tablet by mouth daily.  . primidone (MYSOLINE) 50 MG tablet Take 1 tablet (50 mg total) by mouth 2 (two) times daily.  . [DISCONTINUED] dicyclomine (BENTYL) 10 MG capsule TAKE 1 TO 2 CAPSULES(10 TO 20 MG) BY MOUTH THREE TIMES DAILY AS NEEDED FOR SPASMS (Patient not taking: Reported on 04/22/2020)  . [DISCONTINUED] potassium chloride SA (KLOR-CON) 10 MEQ tablet Take 1 tablet (10 mEq total) by mouth 2 (two) times daily for 5 days. (Patient not taking: Reported on 04/22/2020)   No facility-administered encounter medications on file as of 04/22/2020.     Objective:    PHYSICAL EXAMINATION:    VITALS:   Vitals:   04/22/20 1113  BP: (!) 115/58  Pulse: 62  SpO2: 97%  Weight: 217 lb (98.4 kg)  Height: 6' 2"  (1.88 m)    GEN:  The patient appears stated age and is in NAD. HEENT:  Normocephalic, atraumatic.  The mucous membranes are moist. The superficial temporal arteries are without ropiness or tenderness. CV:  RRR Lungs:  CTAB Neck/HEME:  There are no carotid bruits bilaterally.  Neurological examination:  Orientation: The patient is alert and oriented x3. Cranial nerves: There is good facial symmetry. The speech is fluent and clear. Soft palate rises symmetrically and there is no tongue deviation. Hearing is intact to conversational tone. Sensation: Sensation is intact to light touch throughout Motor: Strength is at least antigravity x4.  Movement examination: Tone: There is normal tone in the UE/LE Abnormal movements: there is R>LUE intention tremor.  he has difficulty with archimedes  spirals on the R only.  Rare rest tremor on the right Coordination:  There is no decremation with RAM's, with any form of RAMS, including alternating supination and pronation of the forearm, hand opening and closing, finger taps, heel taps and toe taps. Gait and Station: The patient has no difficulty arising out of a deep-seated chair without the use of the hands. The patient's gait is antalgic due to uneven leg length   Chemistry      Component Value Date/Time   NA 138 04/15/2020 0805   K 4.2 04/15/2020 0805   CL 101 04/15/2020 0805   CO2 29 04/15/2020 0805   BUN 9 04/15/2020 0805   CREATININE 0.66 04/15/2020 0805   CREATININE 1.20 06/15/2016 0849      Component Value Date/Time   CALCIUM 8.8 04/15/2020 0805   ALKPHOS 80 11/30/2019 0826   AST 33 11/30/2019 0826   ALT 42 11/30/2019 0826   BILITOT 0.5 11/30/2019 0826      Lab Results  Component Value Date   WBC 6.5 04/01/2020   HGB 11.8 (L) 04/01/2020   HCT 35.1 (L) 04/01/2020   MCV 84.9 04/01/2020   PLT 246.0 04/01/2020   Lab Results  Component Value Date   TSH 0.92 04/26/2018     Chemistry      Component Value Date/Time   NA 138 04/15/2020 0805   K 4.2 04/15/2020 0805   CL 101 04/15/2020 0805   CO2 29 04/15/2020 0805   BUN 9 04/15/2020 0805   CREATININE 0.66 04/15/2020 0805   CREATININE 1.20 06/15/2016 0849      Component Value Date/Time   CALCIUM 8.8 04/15/2020 0805   ALKPHOS 80 11/30/2019 0826   AST 33 11/30/2019 0826   ALT 42 11/30/2019 0826   BILITOT 0.5 11/30/2019 0826         Total time spent on today's visit was 25 minutes, including both face-to-face time and nonface-to-face time.  Time included that spent on review of records (prior notes available to me/labs/imaging if pertinent), discussing treatment and goals, answering patient's questions and coordinating care.  Cc:  Colon Branch, MD

## 2020-04-22 ENCOUNTER — Encounter: Payer: Self-pay | Admitting: Neurology

## 2020-04-22 ENCOUNTER — Other Ambulatory Visit: Payer: Self-pay

## 2020-04-22 ENCOUNTER — Ambulatory Visit (INDEPENDENT_AMBULATORY_CARE_PROVIDER_SITE_OTHER): Payer: PPO | Admitting: Neurology

## 2020-04-22 VITALS — BP 115/58 | HR 62 | Ht 74.0 in | Wt 217.0 lb

## 2020-04-22 DIAGNOSIS — G250 Essential tremor: Secondary | ICD-10-CM

## 2020-04-22 MED ORDER — PRIMIDONE 50 MG PO TABS: ORAL_TABLET | ORAL | 1 refills | Status: DC

## 2020-04-22 NOTE — Patient Instructions (Addendum)
Increase primidone, 50 mg, 3 tablets in the AM, 2 tablets in the PM  The physicians and staff at [facility] are committed to providing excellent care. You may receive a survey requesting feedback about your experience at our office. We strive to receive "very good" responses to the survey questions. If you feel that your experience would prevent you from giving the office a "very good " response, please contact our office to try to remedy the situation. We may be reached at [phone]. Thank you for taking the time out of your busy day to complete the survey.

## 2020-04-30 ENCOUNTER — Ambulatory Visit: Payer: PPO | Admitting: Internal Medicine

## 2020-04-30 ENCOUNTER — Encounter: Payer: Self-pay | Admitting: Internal Medicine

## 2020-04-30 ENCOUNTER — Other Ambulatory Visit: Payer: Self-pay

## 2020-04-30 VITALS — BP 120/64 | HR 57 | Ht 74.0 in | Wt 218.4 lb

## 2020-04-30 DIAGNOSIS — E1159 Type 2 diabetes mellitus with other circulatory complications: Secondary | ICD-10-CM

## 2020-04-30 DIAGNOSIS — E1165 Type 2 diabetes mellitus with hyperglycemia: Secondary | ICD-10-CM

## 2020-04-30 DIAGNOSIS — E118 Type 2 diabetes mellitus with unspecified complications: Secondary | ICD-10-CM

## 2020-04-30 DIAGNOSIS — E1142 Type 2 diabetes mellitus with diabetic polyneuropathy: Secondary | ICD-10-CM

## 2020-04-30 DIAGNOSIS — Z794 Long term (current) use of insulin: Secondary | ICD-10-CM

## 2020-04-30 LAB — POCT GLUCOSE (DEVICE FOR HOME USE): POC Glucose: 204 mg/dl — AB (ref 70–99)

## 2020-04-30 MED ORDER — INSULIN SYRINGES (DISPOSABLE) U-100 0.3 ML MISC: 1.0000 | Freq: Two times a day (BID) | 11 refills | Status: DC

## 2020-04-30 MED ORDER — NOVOLIN 70/30 (70-30) 100 UNIT/ML ~~LOC~~ SUSP
15.0000 [IU] | Freq: Two times a day (BID) | SUBCUTANEOUS | 11 refills | Status: DC
Start: 2020-04-30 — End: 2020-05-28

## 2020-04-30 NOTE — Patient Instructions (Addendum)
-   Continue Metformin 1000 mg, 1 tablet with Breakfast and Supper - Novolin Mix 70/30 take 15 units before breakfast and Supper     Choose healthy, lower carb lower calorie snacks: toss salad, vegetables, cottage cheese, peanut butter, low fat cheese / string cheese, lower sodium deli meat, tuna salad or chicken salad      HOW TO TREAT LOW BLOOD SUGARS (Blood sugar LESS THAN 70 MG/DL)  Please follow the RULE OF 15 for the treatment of hypoglycemia treatment (when your (blood sugars are less than 70 mg/dL)    STEP 1: Take 15 grams of carbohydrates when your blood sugar is low, which includes:   3-4 GLUCOSE TABS  OR  3-4 OZ OF JUICE OR REGULAR SODA OR  ONE TUBE OF GLUCOSE GEL     STEP 2: RECHECK blood sugar in 15 MINUTES STEP 3: If your blood sugar is still low at the 15 minute recheck --> then, go back to STEP 1 and treat AGAIN with another 15 grams of carbohydrates.

## 2020-04-30 NOTE — Progress Notes (Signed)
Name: Randy Castro.  MRN/ DOB: [id number], Mar 02, 1951   Age/ Sex: 69 y.o., male    PCP: [name], MD   Reason for Endocrinology Evaluation: Type 2 Diabetes Mellitus     Date of Initial Endocrinology Visit: 04/30/2020     PATIENT IDENTIFIER: Randy Castro. is a 69 y.o. male with a past medical history of T2DM, CAD, dyslipidemia and HTN . The patient presented for initial endocrinology clinic visit on 04/30/2020 for consultative assistance with his diabetes management.    HPI: Randy Castro was    Diagnosed with T2DM in 1998 Prior Medications tried/Intolerance: Janumet and Glimepiride . Was put on insulin in 2012 following admission for NSTEMI. Trulicity - nausea and vomiting  Currently checking blood sugars 1-2 x / day Hypoglycemia episodes : yes        Symptoms: yes             Frequency: rare Hemoglobin A1c has ranged from 7.3% in 2013, peaking at 13.4% in 2019 Patient required assistance for hypoglycemia: no Patient has required hospitalization within the last 1 year from hyper or hypoglycemia: no   In terms of diet, the patient eats 3 meals a day, drinks sweet tea. Snacks 2 x a day     Pt was established with Dr. Elvera Lennox from 2013 to 2014  HOME DIABETES REGIMEN: Metformin 1000 mg BID  Novolin-N 15 units BID   Statin: Yes ACE-I/ARB:Yes    METER DOWNLOAD SUMMARY: Did not bring    DIABETIC COMPLICATIONS: Microvascular complications:    Denies: CKD, retinopathy , neuropathy   Last eye exam: Completed 2021  Macrovascular complications:   CAD (S/P PCI )   Denies: PVD, CVA   PAST HISTORY: Past Medical History:  Past Medical History:  Diagnosis Date  . Anxiety   . Arthritis   . CAD (coronary artery disease)    a. NSTEMI 12/12: EF 40-45%. VHR:WCEFSFD balloon PTCA + Promus DES x 1 to mid LAD;  b. 11/2012: Cath with Cutting balloon PTCA  LAD for ISR to LAD, EF 55%;  c. 12/2012 Cath/PCI: LAD stents patent, 80 ost Diag (jailed)->PTCA, LCX/RCA patent.  .  Essential hypertension   . GI bleed   . History of blood transfusion    was getting 4 units  . Hyperlipidemia   . Ischemic cardiomyopathy    a.  echo 08/06/11: dist ant wall, apical and septal and infero-apical HK, mild LVH, EF 40%, mild LAE, PASP 34, asc aorta mildly dilated, mild TR;  b.  Echo (3/13) showed recovery of LV systolic function with EF 55-60%, grade I diastolic dysfunction, mild MR.   . Myocardial infarction (HCC)    twice with NSTEMI 2012  . Palpitations   . Type 2 diabetes mellitus (HCC)    Past Surgical History:  Past Surgical History:  Procedure Laterality Date  . ANTERIOR CERVICAL DECOMP/DISCECTOMY FUSION  in the 90s  . CHOLECYSTECTOMY  03/27/2019  . COLONOSCOPY  2021  . COLONOSCOPY WITH ESOPHAGOGASTRODUODENOSCOPY (EGD)  10/2019  . CORONARY ANGIOPLASTY  12/15/2012; 12/22/2012  . CORONARY ANGIOPLASTY WITH STENT PLACEMENT  07/2011   "1" (12/22/2012)  . GASTRIC BYPASS  12/21/2017  . KNEE ARTHROSCOPY Left 12/31/2016  . LEFT HEART CATHETERIZATION WITH CORONARY ANGIOGRAM N/A 08/06/2011   Procedure: LEFT HEART CATHETERIZATION WITH CORONARY ANGIOGRAM;  Surgeon: Kathleene Hazel, MD;  Location: [facility] C[facility] LAB;  Service: Cardiovascular;  Laterality: N/A;  . LEFT HEART CATHETERIZATION WITH CORONARY ANGIOGRAM N/A 12/15/2012   Procedure: LEFT  HEART CATHETERIZATION WITH CORONARY ANGIOGRAM;  Surgeon: Sherren Mocha, MD;  Location: [facility] CATH LAB;  Service: Cardiovascular;  Laterality: N/A;  . LEFT HEART CATHETERIZATION WITH CORONARY ANGIOGRAM N/A 12/22/2012   Procedure: LEFT HEART CATHETERIZATION WITH CORONARY ANGIOGRAM;  Surgeon: Sherren Mocha, MD;  Location: [facility] CATH LAB;  Service: Cardiovascular;  Laterality: N/A;  . PERCUTANEOUS CORONARY STENT INTERVENTION (PCI-S) Right 12/15/2012   Procedure: PERCUTANEOUS CORONARY STENT INTERVENTION (PCI-S);  Surgeon: Sherren Mocha, MD;  Location: [facility] CATH LAB;  Service: Cardiovascular;  Laterality: Right;      Social History:  reports that he quit  smoking about 28 years ago. His smoking use included cigarettes. He has a 25.00 pack-year smoking history. He has never used smokeless tobacco. He reports current alcohol use. He reports that he does not use drugs. Family History:  Family History  Problem Relation Age of Onset  . Diabetes Brother   . Lung cancer Brother   . Diabetes Mother   . Coronary artery disease Father   . Heart attack Father 68  . Diabetes Sister   . Coronary artery disease Sister   . Colon cancer Neg Hx   . Prostate cancer Neg Hx   . Stroke Neg Hx   . Esophageal cancer Neg Hx   . Colon polyps Neg Hx   . Rectal cancer Neg Hx   . Stomach cancer Neg Hx      HOME MEDICATIONS: Allergies as of 04/30/2020      Reactions   Trulicity [dulaglutide] Nausea And Vomiting      Medication List       Accurate as of April 30, 2020  1:32 PM. If you have any questions, ask your nurse or doctor.        aspirin EC 81 MG tablet Take 81 mg by mouth daily.   atorvastatin 80 MG tablet Commonly known as: LIPITOR TAKE 1 TABLET(80 MG) BY MOUTH AT BEDTIME   carvedilol 12.5 MG tablet Commonly known as: COREG Take 1 tablet (12.5 mg total) by mouth 2 (two) times daily with a meal.   citalopram 20 MG tablet Commonly known as: CELEXA Take 1 tablet (20 mg total) by mouth daily.   clotrimazole-betamethasone cream Commonly known as: LOTRISONE Apply topically 2 (two) times daily.   FeroSul 325 (65 FE) MG tablet Generic drug: ferrous sulfate TAKE 1 TABLET BY MOUTH DAILY   losartan 50 MG tablet Commonly known as: COZAAR TAKE 1 TABLET BY MOUTH DAILY   magnesium oxide 400 (241.3 Mg) MG tablet Commonly known as: MAGnesium-Oxide Take 1 tablet (400 mg total) by mouth 2 (two) times daily.   metFORMIN 1000 MG tablet Commonly known as: GLUCOPHAGE Take 1 tablet (1,000 mg total) by mouth 2 (two) times daily.   nitroGLYCERIN 0.4 MG SL tablet Commonly known as: NITROSTAT Place 1 tablet (0.4 mg total) under the tongue every  5 (five) minutes as needed for chest pain.   NovoLIN N 100 UNIT/ML injection Generic drug: insulin NPH Human Inject into the skin. 15 units in morning, 15 units in evening   ONE TOUCH ULTRA 2 w/Device Kit by Does not apply route.   OneTouch Ultra test strip Generic drug: glucose blood Check blood sugar 2-3 times daily.  Dx Code: E11.9   pantoprazole 40 MG tablet Commonly known as: PROTONIX Take 1 tablet (40 mg total) by mouth 2 (two) times daily before a meal.   Potassium 99 MG Tabs Take 1 tablet by mouth daily.   primidone 50 MG tablet Commonly known  as: MYSOLINE 3 in the AM, 2 in the PM        ALLERGIES: Allergies  Allergen Reactions  . Trulicity [Dulaglutide] Nausea And Vomiting     REVIEW OF SYSTEMS: A comprehensive ROS was conducted with the patient and is negative except as per HPI and below:     OBJECTIVE:   VITAL SIGNS: BP 120/64 (BP Location: Left Arm, Patient Position: Sitting, Cuff Size: Normal)   Pulse (!) 57   Ht _0  (1.88 m)   Wt 218 lb 6.4 oz (99.1 kg)   SpO2 96%   BMI 28.04 kg/m      PHYSICAL EXAM:  General: Pt appears well and is in NAD  Neck: General: Supple without adenopathy or carotid bruits. Thyroid: Thyroid size normal.  No goiter or nodules appreciated. No thyroid bruit.  Lungs: Clear with good BS bilat with no rales, rhonchi, or wheezes  Heart: RRR with normal S1 and S2 and no gallops; no murmurs; no rub  Abdomen: Normoactive bowel sounds, soft, nontender, without masses or organomegaly palpable  Extremities:  Lower extremities - No pretibial edema. No lesions.  Skin: Normal texture and temperature to palpation. No rash noted. No Acanthosis nigricans/skin tags. No lipohypertrophy.  Neuro: MS is good with appropriate affect, pt is alert and Ox3    DM foot exam: 04/30/2020  The skin of the feet is intact without sores or ulcerations. The pedal pulses are 2+ on right and 2+ on left. The sensation is decreased to a screening  5.07, 10 gram monofilament bilaterally   DATA REVIEWED:  Lab Results  Component Value Date   HGBA1C 9.7 (H) 04/01/2020   HGBA1C 7.7 09/12/2019   HGBA1C 8.2 04/20/2019   Lab Results  Component Value Date   MICROALBUR 1.72 08/05/2012   LDLCALC 24 04/01/2020   CREATININE 0.66 04/15/2020   Lab Results  Component Value Date   MICRALBCREAT 11.8 08/05/2012    Lab Results  Component Value Date   CHOL 78 04/01/2020   HDL 32.80 (L) 04/01/2020   LDLCALC 24 04/01/2020   LDLDIRECT 145.0 04/26/2018   TRIG 106.0 04/01/2020   CHOLHDL 2 04/01/2020        ASSESSMENT / PLAN / RECOMMENDATIONS:   1) Type 2 Diabetes Mellitus, Poorly controlled, With neuropathic, and macrovascular  complications - Most recent A1c of 9.7 %. Goal A1c < 7.0%.    GENERAL: I have discussed with the patient the pathophysiology of diabetes. We went over the natural progression of the disease. We talked about both insulin resistance and insulin deficiency. We stressed the importance of lifestyle changes including diet and exercise. I explained the complications associated with diabetes including retinopathy, nephropathy, neuropathy as well as increased risk of cardiovascular disease. We went over the benefit seen with glycemic control.    I explained to the patient that diabetic patients are at higher than normal risk for amputations.   Pt is concerned about the cost of medication , used to be on lantus in the past, but he would end up in the donut hole within the first 3 months of the year.   Pt would like to continue using current type of insulin.   He is a great candidate for SGLT-2 inhibitors but again cost prohibitive.   Emphasized the importance of taking his insulin with breakfast and supper   MEDICATIONS:  Continue Metformin 1000 mg BID   Stop Novolin- N   Start Novolin Mix 15 units BID   EDUCATION / INSTRUCTIONS:  BG monitoring instructions: Patient is instructed to check his blood sugars 1  times a day, fasting  Call [facility] if: BG persistently < 70  . I reviewed the Rule of 15 for the treatment of hypoglycemia in detail with the patient. Literature supplied.   2) Diabetic complications:   Eye: Does not have known diabetic retinopathy.   Neuro/ Feet: Does  have known diabetic peripheral neuropathy.  Renal: Patient does not have known baseline CKD. He is not on an ACEI/ARB at present.  3) Dyslipidemia Patient is on Atorvastatin . LDL 24 mg/dL Discussed cardiovascular benefits of statins.     F/U in 3 months     Signed electronically by: Mack Guise, MD  [facility]  [facility] [address] [place], [place] [address] Phone: [phone] FAX: [phone]   CC: Colon Branch, [address] [place] [address] Phone: [phone]  Fax: [phone]    Return to Endocrinology clinic as below: Future Appointments  Date Time Provider [place]  05/08/2020  9:40 AM Satira Sark, MD [facility]  05/27/2020  9:00 AM [facility] PHARMACIST [facility]  07/02/2020 10:20 AM Colon Branch, MD [facility]  10/22/2020 11:15 AM [name], [name], DO [facility] None

## 2020-05-08 ENCOUNTER — Ambulatory Visit (INDEPENDENT_AMBULATORY_CARE_PROVIDER_SITE_OTHER): Payer: PPO | Admitting: Cardiology

## 2020-05-08 ENCOUNTER — Other Ambulatory Visit: Payer: Self-pay

## 2020-05-08 ENCOUNTER — Encounter: Payer: Self-pay | Admitting: Cardiology

## 2020-05-08 VITALS — BP 140/64 | HR 68 | Ht 74.0 in | Wt 219.0 lb

## 2020-05-08 DIAGNOSIS — I429 Cardiomyopathy, unspecified: Secondary | ICD-10-CM | POA: Diagnosis not present

## 2020-05-08 DIAGNOSIS — E782 Mixed hyperlipidemia: Secondary | ICD-10-CM | POA: Diagnosis not present

## 2020-05-08 DIAGNOSIS — I25119 Atherosclerotic heart disease of native coronary artery with unspecified angina pectoris: Secondary | ICD-10-CM | POA: Diagnosis not present

## 2020-05-08 NOTE — Progress Notes (Signed)
Cardiology Office Note  Date: 05/08/2020   ID: Randy Foots., DOB Jul 29, 1951, MRN [id number]  PCP:  Randy Branch, MD  Cardiologist:  Randy Lesches, MD Electrophysiologist:  None   Chief Complaint  Patient presents with  . Cardiac follow-up    History of Present Illness: Randy Castro. is a 69 y.o. male last seen in March.  He presents for a routine visit.  From a cardiac perspective, he has been doing well since last encounter, reports no angina symptoms or nitroglycerin use. He enjoys working in his [employer].  Recent follow-up echocardiogram in July revealed LVEF 50 to 55%, mild mitral regurgitation, mildly dilated aortic root at 41 mm.  I reviewed his medications which are stable from a cardiac perspective and outlined below.  Past Medical History:  Diagnosis Date  . Anxiety   . Arthritis   . CAD (coronary artery disease)    a. NSTEMI 12/12: EF 40-45%. EXH:BZJIRCV balloon PTCA + Promus DES x 1 to mid LAD;  b. 11/2012: Cath with Cutting balloon PTCA  LAD for ISR to LAD, EF 55%;  c. 12/2012 Cath/PCI: LAD stents patent, 80 ost Diag (jailed)->PTCA, LCX/RCA patent.  . Essential hypertension   . GI bleed   . History of blood transfusion    was getting 4 units  . Hyperlipidemia   . Ischemic cardiomyopathy    a.  echo 08/06/11: dist ant wall, apical and septal and infero-apical HK, mild LVH, EF 40%, mild LAE, PASP 34, asc aorta mildly dilated, mild TR;  b.  Echo (3/13) showed recovery of LV systolic function with EF 89-38%, grade I diastolic dysfunction, mild MR.   . Myocardial infarction ([place])    twice with NSTEMI 2012  . Palpitations   . Type 2 diabetes mellitus ([place])     Past Surgical History:  Procedure Laterality Date  . ANTERIOR CERVICAL DECOMP/DISCECTOMY FUSION  in the 90s  . CHOLECYSTECTOMY  03/27/2019  . COLONOSCOPY  2021  . COLONOSCOPY WITH ESOPHAGOGASTRODUODENOSCOPY (EGD)  10/2019  . CORONARY ANGIOPLASTY  12/15/2012; 12/22/2012  . CORONARY ANGIOPLASTY WITH  STENT PLACEMENT  07/2011   "1" (12/22/2012)  . GASTRIC BYPASS  12/21/2017  . KNEE ARTHROSCOPY Left 12/31/2016  . LEFT HEART CATHETERIZATION WITH CORONARY ANGIOGRAM N/A 08/06/2011   Procedure: LEFT HEART CATHETERIZATION WITH CORONARY ANGIOGRAM;  Surgeon: Burnell Blanks, MD;  Location: [facility] CATH LAB;  Service: Cardiovascular;  Laterality: N/A;  . LEFT HEART CATHETERIZATION WITH CORONARY ANGIOGRAM N/A 12/15/2012   Procedure: LEFT HEART CATHETERIZATION WITH CORONARY ANGIOGRAM;  Surgeon: Sherren Mocha, MD;  Location: [facility] CATH LAB;  Service: Cardiovascular;  Laterality: N/A;  . LEFT HEART CATHETERIZATION WITH CORONARY ANGIOGRAM N/A 12/22/2012   Procedure: LEFT HEART CATHETERIZATION WITH CORONARY ANGIOGRAM;  Surgeon: Sherren Mocha, MD;  Location: [facility] CATH LAB;  Service: Cardiovascular;  Laterality: N/A;  . PERCUTANEOUS CORONARY STENT INTERVENTION (PCI-S) Right 12/15/2012   Procedure: PERCUTANEOUS CORONARY STENT INTERVENTION (PCI-S);  Surgeon: Sherren Mocha, MD;  Location: [facility] CATH LAB;  Service: Cardiovascular;  Laterality: Right;    Current Outpatient Medications  Medication Sig Dispense Refill  . aspirin EC 81 MG tablet Take 81 mg by mouth daily.    Marland Kitchen atorvastatin (LIPITOR) 80 MG tablet TAKE 1 TABLET(80 MG) BY MOUTH AT BEDTIME 90 tablet 1  . Blood Glucose Monitoring Suppl (ONE TOUCH ULTRA 2) w/Device KIT by Does not apply route.    . carvedilol (COREG) 12.5 MG tablet Take 1 tablet (12.5 mg total) by mouth  2 (two) times daily with a meal. 180 tablet 1  . citalopram (CELEXA) 20 MG tablet Take 1 tablet (20 mg total) by mouth daily. 90 tablet 1  . clotrimazole-betamethasone (LOTRISONE) cream Apply topically 2 (two) times daily. 30 g 0  . FEROSUL 325 (65 Fe) MG tablet TAKE 1 TABLET BY MOUTH DAILY 30 tablet 3  . glucose blood (ONETOUCH ULTRA) test strip Check blood sugar 2-3 times daily.  Dx Code: E11.9 300 each 1  . insulin NPH-regular Human (NOVOLIN 70/30) (70-30) 100 UNIT/ML injection Inject 15 Units  into the skin 2 (two) times daily with a meal. 10 mL 11  . Insulin Syringes, Disposable, U-100 0.3 ML MISC 1 Device by Does not apply route in the morning and at bedtime. 100 each 11  . losartan (COZAAR) 50 MG tablet TAKE 1 TABLET BY MOUTH DAILY 90 tablet 1  . magnesium oxide (MAGNESIUM-OXIDE) 400 (241.3 Mg) MG tablet Take 1 tablet (400 mg total) by mouth 2 (two) times daily. 180 tablet 3  . metFORMIN (GLUCOPHAGE) 1000 MG tablet Take 1 tablet (1,000 mg total) by mouth 2 (two) times daily. 180 tablet 1  . nitroGLYCERIN (NITROSTAT) 0.4 MG SL tablet Place 1 tablet (0.4 mg total) under the tongue every 5 (five) minutes as needed for chest pain. 100 tablet 3  . pantoprazole (PROTONIX) 40 MG tablet Take 1 tablet (40 mg total) by mouth 2 (two) times daily before a meal. 180 tablet 3  . Potassium 99 MG TABS Take 1 tablet by mouth daily.    . primidone (MYSOLINE) 50 MG tablet 3 in the AM, 2 in the PM 450 tablet 1   No current facility-administered medications for this visit.   Allergies:  Trulicity [dulaglutide]   ROS:  No palpitations or syncope.  Physical Exam: VS:  BP 140/64   Pulse 68   Ht 6' 2"  (1.88 m)   Wt 219 lb (99.3 kg)   SpO2 98%   BMI 28.12 kg/m , BMI Body mass index is 28.12 kg/m.  Wt Readings from Last 3 Encounters:  05/08/20 219 lb (99.3 kg)  04/30/20 218 lb 6.4 oz (99.1 kg)  04/22/20 217 lb (98.4 kg)    General: Patient appears comfortable at rest. HEENT: Conjunctiva and lids normal, wearing a mask. Neck: Supple, no elevated JVP or carotid bruits, no thyromegaly. Lungs: Clear to auscultation, nonlabored breathing at rest. Cardiac: Regular rate and rhythm, no S3, soft systolic murmur, no pericardial rub. Extremities: No pitting edema, distal pulses 2+.  ECG:  An ECG dated 12/26/2019 was personally reviewed today and demonstrated:  Sinus rhythm with LVH/IVCD.  Recent Labwork: 11/30/2019: ALT 42; AST 33 04/01/2020: Hemoglobin 11.8; Platelets 246.0 04/15/2020: BUN 9;  Creatinine, Ser 0.66; Potassium 4.2; Sodium 138     Component Value Date/Time   CHOL 78 04/01/2020 0910   TRIG 106.0 04/01/2020 0910   HDL 32.80 (L) 04/01/2020 0910   CHOLHDL 2 04/01/2020 0910   VLDL 21.2 04/01/2020 0910   LDLCALC 24 04/01/2020 0910   LDLDIRECT 145.0 04/26/2018 1026    Other Studies Reviewed Today:  [facility] Myoview 12/15/2017:  There was no ST segment deviation noted during stress.  Findings consistent with larege prior inferior myocardial infarction.  This is a high risk study. Risked based on decreased LVEF. There is no current myocardium at jeopardy. Consider correlating LVEF with echocardiogram.  The left ventricular ejection fraction is moderately decreased (30-44%).  Echocardiogram7/29/2021: 1. Left ventricular ejection fraction, by estimation, is 50 to 55%. The  left ventricle has low normal function. The left ventricle has no regional  wall motion abnormalities. Left ventricular diastolic parameters are  consistent with Grade I diastolic  dysfunction (impaired relaxation).  2. Right ventricular systolic function is normal. The right ventricular  size is normal. There is normal pulmonary artery systolic pressure.  3. Left atrial size was severely dilated.  4. The mitral valve is normal in structure. Mild mitral valve  regurgitation. No evidence of mitral stenosis.  5. The aortic valve is tricuspid. Aortic valve regurgitation is not  visualized. No aortic stenosis is present.  6. Aortic dilatation noted. There is mild dilatation of the aortic root  measuring 41 mm.  7. The inferior vena cava is normal in size with greater than 50%  respiratory variability, suggesting right atrial pressure of 3 mmHg.  8. Probable small PFO with left to right shunt.   Assessment and Plan:  1.  CAD status post previous LAD intervention, more recently cutting balloon angioplasty for in-stent restenosis in 2014 with subsequent angioplasty of jailed diagonal  Castro.  He is doing well without active angina at this time.  Plavix discontinued in light of prior GI bleeding discussed in the previous note.  Continue aspirin, Coreg, Lipitor, Cozaar, and as needed nitroglycerin.  2.  Secondary cardiomyopathy, follow-up LVEF 50 to 55% by recent echocardiogram.  3.  Mixed hyperlipidemia on high-dose Lipitor.  Last LDL 24.  Medication Adjustments/Labs and Tests Ordered: Current medicines are reviewed at length with the patient today.  Concerns regarding medicines are outlined above.   Tests Ordered: No orders of the defined types were placed in this encounter.   Medication Changes: No orders of the defined types were placed in this encounter.   Disposition:  Follow up 6 months in the [place] office.  Signed, Satira Sark, MD, [facility] 05/08/2020 9:57 AM    [facility] [address], [place], [place] [address] Phone: [phone]; Fax: [phone]

## 2020-05-08 NOTE — Patient Instructions (Addendum)

## 2020-05-14 ENCOUNTER — Encounter: Payer: Self-pay | Admitting: Internal Medicine

## 2020-05-14 MED ORDER — MAGNESIUM OXIDE 400 (241.3 MG) MG PO TABS: 1.0000 | ORAL_TABLET | Freq: Two times a day (BID) | ORAL | 3 refills | Status: DC

## 2020-05-20 ENCOUNTER — Other Ambulatory Visit: Payer: Self-pay

## 2020-05-20 DIAGNOSIS — Z794 Long term (current) use of insulin: Secondary | ICD-10-CM

## 2020-05-20 DIAGNOSIS — E1159 Type 2 diabetes mellitus with other circulatory complications: Secondary | ICD-10-CM

## 2020-05-20 DIAGNOSIS — E7800 Pure hypercholesterolemia, unspecified: Secondary | ICD-10-CM

## 2020-05-20 DIAGNOSIS — I10 Essential (primary) hypertension: Secondary | ICD-10-CM

## 2020-05-23 ENCOUNTER — Telehealth: Payer: Self-pay | Admitting: Pharmacist

## 2020-05-23 NOTE — Progress Notes (Addendum)
Chronic Care Management Pharmacy Assistant   Name: Randy Castro.  MRN: [id number] DOB: July 03, 1951  Reason for Encounter: Initial Questions  PCP : Colon Branch, MD  Allergies:   Allergies  Allergen Reactions  . Trulicity [Dulaglutide] Nausea And Vomiting    Medications: Outpatient Encounter Medications as of 05/23/2020  Medication Sig Note  . aspirin EC 81 MG tablet Take 81 mg by mouth daily.   Marland Kitchen atorvastatin (LIPITOR) 80 MG tablet TAKE 1 TABLET(80 MG) BY MOUTH AT BEDTIME   . Blood Glucose Monitoring Suppl (ONE TOUCH ULTRA 2) w/Device KIT by Does not apply route.   . carvedilol (COREG) 12.5 MG tablet Take 1 tablet (12.5 mg total) by mouth 2 (two) times daily with a meal.   . citalopram (CELEXA) 20 MG tablet Take 1 tablet (20 mg total) by mouth daily.   . clotrimazole-betamethasone (LOTRISONE) cream Apply topically 2 (two) times daily. 02/18/2017: Uses as needed  . FEROSUL 325 (65 Fe) MG tablet TAKE 1 TABLET BY MOUTH DAILY   . glucose blood (ONETOUCH ULTRA) test strip Check blood sugar 2-3 times daily.  Dx Code: E11.9   . insulin NPH-regular Human (NOVOLIN 70/30) (70-30) 100 UNIT/ML injection Inject 15 Units into the skin 2 (two) times daily with a meal.   . Insulin Syringes, Disposable, U-100 0.3 ML MISC 1 Device by Does not apply route in the morning and at bedtime.   Marland Kitchen losartan (COZAAR) 50 MG tablet TAKE 1 TABLET BY MOUTH DAILY   . magnesium oxide (MAGNESIUM-OXIDE) 400 (241.3 Mg) MG tablet Take 1 tablet (400 mg total) by mouth 2 (two) times daily.   . metFORMIN (GLUCOPHAGE) 1000 MG tablet Take 1 tablet (1,000 mg total) by mouth 2 (two) times daily.   . nitroGLYCERIN (NITROSTAT) 0.4 MG SL tablet Place 1 tablet (0.4 mg total) under the tongue every 5 (five) minutes as needed for chest pain. 05/04/2019: PRN  . pantoprazole (PROTONIX) 40 MG tablet Take 1 tablet (40 mg total) by mouth 2 (two) times daily before a meal.   . Potassium 99 MG TABS Take 1 tablet by mouth daily.   .  primidone (MYSOLINE) 50 MG tablet 3 in the AM, 2 in the PM    No facility-administered encounter medications on file as of 05/23/2020.    Current Diagnosis: Patient Active Problem List   Diagnosis Date Noted  . Type 2 diabetes mellitus with hyperglycemia, with long-term current use of insulin ([place]) 04/30/2020  . DM (diabetes mellitus) with complications ([place]) 67/61/9509  . Type 2 diabetes mellitus with diabetic polyneuropathy, with long-term current use of insulin ([place]) 04/30/2020  . Diabetes mellitus ([place]) 04/30/2020  . 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 Lower GI bleed 10/24/2019  . 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 Abdominal pain 11/17/2018  . Cardiac arrhythmia, unspecified 11/14/2017  . Hypomagnesemia 11/14/2017  . Anemia 11/14/2017  . Bleeding skin mole 11/14/2017  . OSA on CPAP 09/21/2017  . Obesity 01/23/2016  . PCP NOTES >>> 06/08/2015  . Anxiety and depression 05/31/2013  . Intermediate coronary syndrome ([place]) 12/22/2012  . Unstable angina ([place]) 12/16/2012  . Fatigue 04/26/2012  . Annual physical exam 10/28/2011  . Rash 10/28/2011  . Coronary Artery Disease 08/28/2011  . 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  . Palpitations 08/28/2011  . NSTEMI (non-ST elevated myocardial infarction) ([place]) 08/07/2011  . DJD (degenerative joint disease) 05/28/2011  . Poorly controlled type 2 diabetes mellitus with circulatory disorder ([place])   . Hyperlipidemia   .  HTN (hypertension)     Goals Addressed   None     Have you seen any other providers since your last visit? No   Any changes in your medications or health? No   Any side effects from any medications? Patient states he is allergic to Trulicity   Do you have an symptoms or problems not managed by your medications? No   Has your provider asked that you check blood pressure, blood sugar, or follow special diet at home? No   Do you get any type of exercise on a regular basis? No   Do  you have any problems getting your medications? No   Is there anything that you would like to discuss during the appointment? None at this time  Documented preliminary medication plan in preparation for patient's initial chronic care management visit.  Reminded the patient to please have medications and supplements available at their appointment on Monday October the 4th at 9:00am over the telephone.   Follow-Up:  Pharmacist Review   Fanny [name], [place] Pharmacist Assistant [phone]  Reviewed by: De Blanch, PharmD Clinical Pharmacist [facility] [phone]

## 2020-05-27 ENCOUNTER — Other Ambulatory Visit: Payer: Self-pay

## 2020-05-27 ENCOUNTER — Telehealth: Payer: Self-pay

## 2020-05-27 ENCOUNTER — Ambulatory Visit: Payer: PPO | Admitting: Pharmacist

## 2020-05-27 DIAGNOSIS — E1159 Type 2 diabetes mellitus with other circulatory complications: Secondary | ICD-10-CM

## 2020-05-27 DIAGNOSIS — I10 Essential (primary) hypertension: Secondary | ICD-10-CM

## 2020-05-27 DIAGNOSIS — E7800 Pure hypercholesterolemia, unspecified: Secondary | ICD-10-CM

## 2020-05-27 DIAGNOSIS — E1165 Type 2 diabetes mellitus with hyperglycemia: Secondary | ICD-10-CM

## 2020-05-27 MED ORDER — ONETOUCH ULTRA VI STRP: ORAL_STRIP | 12 refills | Status: DC

## 2020-05-27 MED ORDER — MAGNESIUM OXIDE 400 (241.3 MG) MG PO TABS: 1.0000 | ORAL_TABLET | Freq: Two times a day (BID) | ORAL | 3 refills | Status: DC

## 2020-05-27 MED ORDER — METFORMIN HCL 1000 MG PO TABS: 1000.0000 mg | ORAL_TABLET | Freq: Two times a day (BID) | ORAL | 1 refills | Status: DC

## 2020-05-27 MED ORDER — CARVEDILOL 12.5 MG PO TABS: 12.5000 mg | ORAL_TABLET | Freq: Two times a day (BID) | ORAL | 1 refills | Status: DC

## 2020-05-27 MED ORDER — ATORVASTATIN CALCIUM 80 MG PO TABS: 80.0000 mg | ORAL_TABLET | Freq: Every day | ORAL | 1 refills | Status: DC

## 2020-05-27 MED ORDER — PANTOPRAZOLE SODIUM 40 MG PO TBEC
40.0000 mg | DELAYED_RELEASE_TABLET | Freq: Two times a day (BID) | ORAL | 3 refills | Status: DC
Start: 2020-05-27 — End: 2021-05-07

## 2020-05-27 MED ORDER — CITALOPRAM HYDROBROMIDE 20 MG PO TABS
20.0000 mg | ORAL_TABLET | Freq: Every day | ORAL | 1 refills | Status: DC
Start: 2020-05-27 — End: 2020-08-07

## 2020-05-27 MED ORDER — LOSARTAN POTASSIUM 50 MG PO TABS
50.0000 mg | ORAL_TABLET | Freq: Every day | ORAL | 1 refills | Status: DC
Start: 2020-05-27 — End: 2020-10-28

## 2020-05-27 MED ORDER — PRIMIDONE 50 MG PO TABS: ORAL_TABLET | ORAL | 1 refills | Status: DC

## 2020-05-27 NOTE — Telephone Encounter (Signed)
Received a call from [facility] stating the patient has changed pharmacies and the patient needs a refill on primidone.   Rx(s) sent to pharmacy electronically.

## 2020-05-27 NOTE — Chronic Care Management (AMB) (Signed)
Chronic Care Management Pharmacy  Name: Randy Castro.  MRN: [id number] DOB: 08/08/1951  Chief Complaint/ HPI  Randy Castro.,  69 y.o. , male presents for their Initial CCM visit with the clinical pharmacist via telephone due to COVID-19 Pandemic.  PCP : Colon Branch, MD  Their chronic conditions include: Hypertension, Hyperlipidemia/CAD, Diabetes, Depression, GERD/Hx of GI Bleed, Essential Tremor, Hypomagnesemia  Office Visits: 04/01/20: Visit w/ Dr. Larose Kells - HTN. Stop lisinopril, start losartan 29m. DM. Consider referral to local endo.  RTC 3 months.  11/30/19: Visit w/ Dr. PLarose Kells- GI bleed 11/06/19. Plavix D/C. No other med changes noted. RTC 4 months.  Consult Visit: 05/08/2020: Cardio visit w/ Dr. MDomenic Polite- CAD. Stable from cardiac perspective. No med changes noted. RTC 6 months.  04/30/20: Endo visit w/ Dr. SKelton Pillar- DM. A1c range 7.3% in 2013 to 13.4% in 2019. Pt concern with medication cost. Was on Lantus in the past and ended up in donut hole w/i first 3 months of the year. Pt would like to continue current type of insulin. Pt great candidate for SGLT2, but cost prohibitive. Emphasized importance of taking insulin with breakfast and supper. Check FBG. RTC 3 months.   04/22/20: Neuro visit w/ Dr. TCarles Collet- Essential Tremor. Increase primidone #3 AM, #2 PM. F/U 6-8 months.  Medications: Outpatient Encounter Medications as of 05/27/2020  Medication Sig Note  . aspirin EC 81 MG tablet Take 81 mg by mouth daily.   .Marland Kitchenatorvastatin (LIPITOR) 80 MG tablet TAKE 1 TABLET(80 MG) BY MOUTH AT BEDTIME   . carvedilol (COREG) 12.5 MG tablet Take 1 tablet (12.5 mg total) by mouth 2 (two) times daily with a meal.   . citalopram (CELEXA) 20 MG tablet Take 1 tablet (20 mg total) by mouth daily.   . FEROSUL 325 (65 Fe) MG tablet TAKE 1 TABLET BY MOUTH DAILY   . insulin NPH-regular Human (NOVOLIN 70/30) (70-30) 100 UNIT/ML injection Inject 15 Units into the skin 2 (two) times daily with a meal.   .  losartan (COZAAR) 50 MG tablet TAKE 1 TABLET BY MOUTH DAILY   . magnesium oxide (MAGNESIUM-OXIDE) 400 (241.3 Mg) MG tablet Take 1 tablet (400 mg total) by mouth 2 (two) times daily.   . metFORMIN (GLUCOPHAGE) 1000 MG tablet Take 1 tablet (1,000 mg total) by mouth 2 (two) times daily.   . pantoprazole (PROTONIX) 40 MG tablet Take 1 tablet (40 mg total) by mouth 2 (two) times daily before a meal.   . Potassium 99 MG TABS Take 1 tablet by mouth daily.   . primidone (MYSOLINE) 50 MG tablet 3 in the AM, 2 in the PM   . Blood Glucose Monitoring Suppl (ONE TOUCH ULTRA 2) w/Device KIT by Does not apply route.   . clotrimazole-betamethasone (LOTRISONE) cream Apply topically 2 (two) times daily. 02/18/2017: Uses as needed  . glucose blood (ONETOUCH ULTRA) test strip Check blood sugar 2-3 times daily.  Dx Code: E11.9   . Insulin Syringes, Disposable, U-100 0.3 ML MISC 1 Device by Does not apply route in the morning and at bedtime.   . nitroGLYCERIN (NITROSTAT) 0.4 MG SL tablet Place 1 tablet (0.4 mg total) under the tongue every 5 (five) minutes as needed for chest pain. 05/04/2019: PRN   No facility-administered encounter medications on file as of 05/27/2020.   SDOH Screenings   Alcohol Screen:   . Last Alcohol Screening Score (AUDIT): Not on file  Depression (PHQ2-9): Low Risk   .  PHQ-2 Score: 0  Financial Resource Strain:   . Difficulty of Paying Living Expenses: Not on file  Food Insecurity:   . Worried About Charity fundraiser in the Last Year: Not on file  . Ran Out of Food in the Last Year: Not on file  Housing:   . Last Housing Risk Score: Not on file  Physical Activity:   . Days of Exercise per Week: Not on file  . Minutes of Exercise per Session: Not on file  Social Connections:   . Frequency of Communication with Friends and Family: Not on file  . Frequency of Social Gatherings with Friends and Family: Not on file  . Attends Religious Services: Not on file  . Active Member of Clubs or  Organizations: Not on file  . Attends Archivist Meetings: Not on file  . Marital Status: Not on file  Stress:   . Feeling of Stress : Not on file  Tobacco Use: Medium Risk  . Smoking Tobacco Use: Former Smoker  . Smokeless Tobacco Use: Never Used  Transportation Needs:   . Film/video editor (Medical): Not on file  . Lack of Transportation (Non-Medical): Not on file     Current Diagnosis/Assessment:  Goals Addressed            This Visit's Progress   . Chronic Care Management Pharmacy Plan       CARE PLAN ENTRY (see longitudinal plan of care for additional care plan information)  Current Barriers:  . Chronic Disease Management support, education, and care coordination needs related to Hypertension, Hyperlipidemia/CAD, Diabetes, Depression, GERD/Hx of GI Bleed, Essential Tremor, Hypomagnesemia   Hypertension BP Readings from Last 3 Encounters:  05/08/20 140/64  04/30/20 120/64  04/22/20 (!) 115/58   . Pharmacist Clinical Goal(s): o Over the next 90 days, patient will work with PharmD and providers to maintain BP goal <130/80 . Current regimen:  . Carvedilol 12.85m twice daily . Losartan 574mdaily . Interventions: o Requested patient check his BP 1-2 times per week and record . Patient self care activities - Over the next 90 days, patient will: o Check BP 1-2 times per week, document, and provide at future appointments o Ensure daily salt intake < 2300 mg/Alfonza Toft  Hyperlipidemia/CAD Lab Results  Component Value Date/Time   LDLCALC 24 04/01/2020 09:10 AM   LDLDIRECT 145.0 04/26/2018 10:26 AM   . Pharmacist Clinical Goal(s): o Over the next 90 days, patient will work with PharmD and providers to maintain LDL goal < 70 . Current regimen:  . Aspirin 8172maily . Atorvastatin 35m29mily . Nitroglycerin 0.4mg 64mneeded . Patient self care activities - Over the next 90 days, patient will: o Maintain cholesterol medication regimen.   Diabetes Lab Results   Component Value Date/Time   HGBA1C 9.7 (H) 04/01/2020 09:10 AM   HGBA1C 7.7 09/12/2019 12:00 AM   HGBA1C 8.2 04/20/2019 12:00 AM   . Pharmacist Clinical Goal(s): o Over the next 180 days, patient will work with PharmD and providers to achieve A1c goal <7% . Current regimen:  . Novolin 70/30 15 units twice daily . Metformin 1000mg 57me daily . Interventions: o Discussed diet and exercise o Created goals to help improve blood sugar . Patient self care activities - Over the next 90 days, patient will: o Check blood sugar twice daily, document, and provide at future appointments o Contact provider with any episodes of hypoglycemia o Eat fewer snacks at night time while watching TV or replace  snacks with healthier alternatives (popcorn w/o butter, sugar free cookies) o Increase vegetable intake o Replace fried foods with baked foods  Hypomagnesemia . Pharmacist Clinical Goal(s) o Over the next 90 days, patient will work with PharmD and providers to maintain magnesium within normal limits . Current regimen:  o Magnesium oxide 484m twice daily . Interventions: o Consider repeat Magnesium level at next office visit . Patient self care activities - Over the next 90 days, patient will: o Maintain magnesium medication regimen o Complete magnesium level at next office visit  Health Maintenance  . Pharmacist Clinical Goal(s) o Over the next 90 days, patient will work with PharmD and providers to complete health maintenance screenings/vaccinations . Interventions: o Recommended patient complete Flu vaccine at next office visit or at pharmacy o Recommended patient complete Shingrix vaccine series at pharmacy . Patient self care activities - Over the next 90 days, patient will: o Complete Flu Vaccine o Complete Shingrix Vaccine Series    Medication management . Pharmacist Clinical Goal(s): o Over the next 90 days, patient will work with PharmD and providers to maintain optimal  medication adherence . Current pharmacy: [facility] switched to [facility] . Interventions o Comprehensive medication review performed. o Utilize [facility] pharmacy for medication synchronization, packaging and delivery . Patient self care activities - Over the next 90 days, patient will: o Focus on medication adherence by filling and taking medications appropriately  o Take medications as prescribed o Report any questions or concerns to PharmD and/or provider(s)  Initial goal documentation       Social Hx:  Does wood work daily. Has a wood shop in his basement. Him and his wife make signs, bowls, cabinets Married 50 years. 2 kids, 3 grandkids  Hypertension   BP goal is:  <130/80  Office blood pressures are  BP Readings from Last 3 Encounters:  05/08/20 140/64  04/30/20 120/64  04/22/20 (!) 115/58   Patient checks BP at home several times per month Patient home BP readings are ranging: Unable to assess  Patient has failed these meds in the past: None noted  Patient is currently controlled on the following medications:  . Carvedilol 12.517mtwice daily . Losartan 5080maily  We discussed BP goal and checking BP  Plan -Continue current medications  -Check BP 1-2 times per week    Hyperlipidemia/CAD   Followed by Cardio (Dr. McDDomenic PoliteLDL goal <70  Lipid Panel     Component Value Date/Time   CHOL 78 04/01/2020 0910   TRIG 106.0 04/01/2020 0910   HDL 32.80 (L) 04/01/2020 0910   LDLCALC 24 04/01/2020 0910   LDLDIRECT 145.0 04/26/2018 1026    Hepatic Function Latest Ref Rng & Units 11/30/2019 11/06/2019 10/31/2019  Total Protein 6.0 - 8.3 g/dL 5.7(L) 6.3(L) 5.4(L)  Albumin 3.5 - 5.2 g/dL 3.9 3.7 3.5  AST 0 - 37 U/L 33 40 26  ALT 0 - 53 U/L 42 45(H) 49  Alk Phosphatase 39 - 117 U/L 80 76 76  Total Bilirubin 0.2 - 1.2 mg/dL 0.5 0.4 0.4  Bilirubin, Direct <=0.2 mg/dL - - -     The ASCVD Risk score (GofHarwoodet al., 2013) failed to calculate for the following  reasons:   The patient has a prior MI or stroke diagnosis   Patient has failed these meds in past: Plavix (GI bleed) Patient is currently controlled on the following medications:  . Aspirin 65m6mily . Atorvastatin 80mg35mly . Nitroglycerin 0.4mg a90meeded  Plan -  Continue current medications  Diabetes   Followed by Endo (Dr. Kelton Pillar)  A1c goal <7%  Recent Relevant Labs: Lab Results  Component Value Date/Time   HGBA1C 9.7 (H) 04/01/2020 09:10 AM   HGBA1C 7.7 09/12/2019 12:00 AM   HGBA1C 8.2 04/20/2019 12:00 AM   GFR 119.80 04/15/2020 08:05 AM   GFR 122.07 11/30/2019 08:26 AM   MICROALBUR 1.72 08/05/2012 03:50 PM   MICROALBUR 1.0 04/13/2011 11:05 AM    Last diabetic Eye exam:  Lab Results  Component Value Date/Time   HMDIABEYEEXA No Retinopathy 03/29/2017 12:00 AM    Last diabetic Foot exam:  Lab Results  Component Value Date/Time   HMDIABFOOTEX Done 03/19/2019 12:00 AM   Recorded 04/30/20 by Dr. Kelton Pillar  Checking BG: Daily  Recent FBG Readings: around 200  Patient has failed these meds in past: Trulicity (n/v) Patient is currently uncontrolled on the following medications: . Novolin 70/30 15 units twice daily . Metformin 1065m twice daily  Diet: Had gastric bypass Eats out about twice a week Was encouraged to drink protein drink B - Veggie omlette L - Sandwiches, veggies D - Boiled chicken, salads, spaghetti Snacks - "I eat too many" eats most of them at night when watching TV(cheese crackers, pork rinds, popcorn, "I can tear up a cookie") Drinks - water, coffee, occassionally sweet tea or a soft drink Replaced regular hot dogs with chicken dogs  Exercise: "Stroll around", yard work, no regular routine  Realizes that his diet is largest contributor to his elevated BG. He states he's trying to get off all the snacks. He also wants to eat scheduled meals. He wants to increase veggies, change fried foods to baked, is open to changing diet.  We  discussed: diet and exercise extensively  Plan -Continue current medications  -Eat fewer snacks at night time while watching TV or replace snacks with healthier alternatives (popcorn w/o butter, sugar free cookies) -Increase vegetable intake -Replace fried foods with baked foods  Depression   Depression screen [facility]/9 05/27/2020 05/04/2019 10/12/2018  Decreased Interest 0 0 0  Down, Depressed, Hopeless 0 0 0  PHQ - 2 Score 0 0 0  Altered sleeping 0 - 0  Tired, decreased energy 0 - 0  Change in appetite 0 - 0  Feeling bad or failure about yourself  0 - 0  Trouble concentrating 0 - 0  Moving slowly or fidgety/restless 0 - 0  Suicidal thoughts 0 - 0  PHQ-9 Score 0 - 0  Some recent data might be hidden    Patient has failed these meds in past: None noted  Patient is currently controlled on the following medications:  . Citalopram 210mdaily  "Keeps me from getting nervous or edgy feeling"  Plan -Continue current medications  GERD/Hx of GI Bleed 11/06/2019   Patient has failed these meds in past: None noted  Patient is currently controlled on the following medications:  . Pantoprazole 4075mwice daily  Breakthrough Sx: None Breakthrough Tx: N/A  Plan -Continue current medications   Essential Tremor   Followed by Neuro (Dr. TatCarles ColletPatient has failed these meds in past: None noted Patient is currently controlled on the following medications: . Primidone 75m78mAM, #2PM  Feels this has definitely helped. He can hold a paint brush now.  Plan -Continue current medications   Hypomagnesemia   05/04/2019 - 1.3 mg/dL  Patient has failed these meds in past: None noted  Patient is currently uncontrolled on the following medications:  Magnesium oxide  456m twice daily  Plan -Consider repeat Mg lab at next visit -Continue current medications   Vaccines   Reviewed and discussed patient's vaccination history.    Immunization History  Administered Date(s)  Administered  . Fluad Quad(high Dose 65+) 05/04/2019  . Influenza Split 06/30/2011  . Influenza, High Dose Seasonal PF 05/31/2013, 05/27/2017, 06/07/2018  . Influenza, Seasonal, Injecte, Preservative Fre 07/28/2012  . Influenza,inj,Quad PF,6+ Mos 05/29/2014, 06/07/2015, 04/23/2016  . PFIZER SARS-COV-2 Vaccination 10/11/2019, 11/01/2019  . Pneumococcal Conjugate-13 12/05/2014  . Pneumococcal Polysaccharide-23 08/07/2011, 10/12/2018  . Tdap 05/31/2013  . Zoster 11/28/2012   [place] [facility] [place]  Plan -Recommended patient receive Shingrix vaccine in pharmacy.  -Recommended patient receive Flu vaccine in office or  Pharmacy. -Update IMZ record to reflect Covid Booster vaccine  Medication Management   [facility] Uses pill box? Yes Pt endorses missing dose about once a month  Miscellaneous Meds Clotrimazole-betamethasone twice daily Ferrous sulfate 665mdaily Potassium 9977maily  We discussed: Discussed benefits of medication synchronization, packaging and delivery as well as enhanced pharmacist oversight with [facility].  Plan  Utilize [facility] pharmacy for medication synchronization, packaging and delivery   Verbal consent obtained for [facility] enhanced pharmacy services (medication synchronization, adherence packaging, delivery coordination). A medication sync plan was created to allow patient to get all medications delivered once every 30 to 90 days per patient preference. Patient understands they have freedom to choose pharmacy and clinical pharmacist will coordinate care between all prescribers and [facility].   Follow up:  1 month blood sugar check in 2 month blood pressure check in  3 month phone visit  KanDe BlanchharmD Clinical Pharmacist [phone]

## 2020-05-27 NOTE — Patient Instructions (Signed)
Visit Information  Goals Addressed            This Visit's Progress    Chronic Care Management Pharmacy Plan       CARE PLAN ENTRY (see longitudinal plan of care for additional care plan information)  Current Barriers:   Chronic Disease Management support, education, and care coordination needs related to Hypertension, Hyperlipidemia/CAD, Diabetes, Depression, GERD/Hx of GI Bleed, Essential Tremor, Hypomagnesemia   Hypertension BP Readings from Last 3 Encounters:  05/08/20 140/64  04/30/20 120/64  04/22/20 (!) 115/58    Pharmacist Clinical Goal(s): o Over the next 90 days, patient will work with PharmD and providers to maintain BP goal <130/80  Current regimen:   Carvedilol 12.5mg  twice daily  Losartan 50mg  daily  Interventions: o Requested patient check his BP 1-2 times per week and record  Patient self care activities - Over the next 90 days, patient will: o Check BP 1-2 times per week, document, and provide at future appointments o Ensure daily salt intake < 2300 mg/Beda Dula  Hyperlipidemia/CAD Lab Results  Component Value Date/Time   LDLCALC 24 04/01/2020 09:10 AM   LDLDIRECT 145.0 04/26/2018 10:26 AM    Pharmacist Clinical Goal(s): o Over the next 90 days, patient will work with PharmD and providers to maintain LDL goal < 70  Current regimen:   Aspirin 81mg  daily  Atorvastatin 80mg  daily  Nitroglycerin 0.4mg  as needed  Patient self care activities - Over the next 90 days, patient will: o Maintain cholesterol medication regimen.   Diabetes Lab Results  Component Value Date/Time   HGBA1C 9.7 (H) 04/01/2020 09:10 AM   HGBA1C 7.7 09/12/2019 12:00 AM   HGBA1C 8.2 04/20/2019 12:00 AM    Pharmacist Clinical Goal(s): o Over the next 180 days, patient will work with PharmD and providers to achieve A1c goal <7%  Current regimen:   Novolin 70/30 15 units twice daily  Metformin 1000mg  twice daily  Interventions: o Discussed diet and  exercise o Created goals to help improve blood sugar  Patient self care activities - Over the next 90 days, patient will: o Check blood sugar twice daily, document, and provide at future appointments o Contact provider with any episodes of hypoglycemia o Eat fewer snacks at night time while watching TV or replace snacks with healthier alternatives (popcorn w/o butter, sugar free cookies) o Increase vegetable intake o Replace fried foods with baked foods  Hypomagnesemia  Pharmacist Clinical Goal(s) o Over the next 90 days, patient will work with PharmD and providers to maintain magnesium within normal limits  Current regimen:  o Magnesium oxide 400mg  twice daily  Interventions: o Consider repeat Magnesium level at next office visit  Patient self care activities - Over the next 90 days, patient will: o Maintain magnesium medication regimen o Complete magnesium level at next office visit  Health Maintenance   Pharmacist Clinical Goal(s) o Over the next 90 days, patient will work with PharmD and providers to complete health maintenance screenings/vaccinations  Interventions: o Recommended patient complete Flu vaccine at next office visit or at pharmacy o Recommended patient complete Shingrix vaccine series at pharmacy  Patient self care activities - Over the next 90 days, patient will: o Complete Flu Vaccine o Complete Shingrix Vaccine Series    Medication management  Pharmacist Clinical Goal(s): o Over the next 90 days, patient will work with PharmD and providers to maintain optimal medication adherence  Current pharmacy: [facility] switched to [facility]  Interventions o Comprehensive medication review performed. o Utilize [facility] for medication synchronization, packaging and delivery  Patient self care activities - Over the next 90 days, patient will: o Focus on medication adherence by filling and taking medications appropriately  o Take medications as  prescribed o Report any questions or concerns to PharmD and/or provider(s)  Initial goal documentation        [name] was given information about Chronic Care Management services today including:  1. CCM service includes personalized support from designated clinical staff supervised by his physician, including individualized plan of care and coordination with other care providers 2. 24/7 contact phone numbers for assistance for urgent and routine care needs. 3. Standard insurance, coinsurance, copays and deductibles apply for chronic care management only during months in which we provide at least 20 minutes of these services. Most insurances cover these services at 100%, however patients may be responsible for any copay, coinsurance and/or deductible if applicable. This service may help you avoid the need for more expensive face-to-face services. 4. Only one practitioner may furnish and bill the service in a calendar month. 5. The patient may stop CCM services at any time (effective at the end of the month) by phone call to the office staff.  Patient agreed to services and verbal consent obtained.   Verbal consent obtained for [facility] enhanced pharmacy services (medication synchronization, adherence packaging, delivery coordination). A medication sync plan was created to allow patient to get all medications delivered once every 30 to 90 days per patient preference. Patient understands they have freedom to choose pharmacy and clinical pharmacist will coordinate care between all prescribers and [facility].  The patient verbalized understanding of instructions provided today and agreed to receive a mailed copy of patient instruction and/or educational materials. Telephone follow up appointment with pharmacy team member scheduled for: 08/27/2020  Melvenia Beam Netta Fodge, PharmD Clinical Pharmacist [facility] [phone]   Carbohydrate Counting for  Diabetes Mellitus, Adult  Carbohydrate counting is a method of keeping track of how many carbohydrates you eat. Eating carbohydrates naturally increases the amount of sugar (glucose) in the blood. Counting how many carbohydrates you eat helps keep your blood glucose within normal limits, which helps you manage your diabetes (diabetes mellitus). It is important to know how many carbohydrates you can safely have in each meal. This is different for every person. A diet and nutrition specialist (registered dietitian) can help you make a meal plan and calculate how many carbohydrates you should have at each meal and snack. Carbohydrates are found in the following foods:  Grains, such as breads and cereals.  Dried beans and soy products.  Starchy vegetables, such as potatoes, peas, and corn.  Fruit and fruit juices.  Milk and yogurt.  Sweets and snack foods, such as cake, cookies, candy, chips, and soft drinks. How do I count carbohydrates? There are two ways to count carbohydrates in food. You can use either of the methods or a combination of both. Reading "Nutrition Facts" on packaged food The "Nutrition Facts" list is included on the labels of almost all packaged foods and beverages in the [place] It includes:  The serving size.  Information about nutrients in each serving, including the grams (g) of carbohydrate per serving. To use the Nutrition Facts":  Decide how many servings you will have.  Multiply the number of servings by the number of carbohydrates per serving.  The resulting number is the total amount of carbohydrates that you will be having. Learning standard serving sizes of other  foods When you eat carbohydrate foods that are not packaged or do not include "Nutrition Facts" on the label, you need to measure the servings in order to count the amount of carbohydrates:  Measure the foods that you will eat with a food scale or measuring cup, if needed.  Decide how many  standard-size servings you will eat.  Multiply the number of servings by 15. Most carbohydrate-rich foods have about 15 g of carbohydrates per serving. ? For example, if you eat 8 oz (170 g) of strawberries, you will have eaten 2 servings and 30 g of carbohydrates (2 servings x 15 g = 30 g).  For foods that have more than one food mixed, such as soups and casseroles, you must count the carbohydrates in each food that is included. The following list contains standard serving sizes of common carbohydrate-rich foods. Each of these servings has about 15 g of carbohydrates:   hamburger bun or  English muffin.   oz (15 mL) syrup.   oz (14 g) jelly.  1 slice of bread.  1 six-inch tortilla.  3 oz (85 g) cooked rice or pasta.  4 oz (113 g) cooked dried beans.  4 oz (113 g) starchy vegetable, such as peas, corn, or potatoes.  4 oz (113 g) hot cereal.  4 oz (113 g) mashed potatoes or  of a large baked potato.  4 oz (113 g) canned or frozen fruit.  4 oz (120 mL) fruit juice.  4-6 crackers.  6 chicken nuggets.  6 oz (170 g) unsweetened dry cereal.  6 oz (170 g) plain fat-free yogurt or yogurt sweetened with artificial sweeteners.  8 oz (240 mL) milk.  8 oz (170 g) fresh fruit or one small piece of fruit.  24 oz (680 g) popped popcorn. Example of carbohydrate counting Sample meal  3 oz (85 g) chicken breast.  6 oz (170 g) Hulet rice.  4 oz (113 g) corn.  8 oz (240 mL) milk.  8 oz (170 g) strawberries with sugar-free whipped topping. Carbohydrate calculation 1. Identify the foods that contain carbohydrates: ? Rice. ? Corn. ? Milk. ? Strawberries. 2. Calculate how many servings you have of each food: ? 2 servings rice. ? 1 serving corn. ? 1 serving milk. ? 1 serving strawberries. 3. Multiply each number of servings by 15 g: ? 2 servings rice x 15 g = 30 g. ? 1 serving corn x 15 g = 15 g. ? 1 serving milk x 15 g = 15 g. ? 1 serving strawberries x 15 g = 15  g. 4. Add together all of the amounts to find the total grams of carbohydrates eaten: ? 30 g + 15 g + 15 g + 15 g = 75 g of carbohydrates total. Summary  Carbohydrate counting is a method of keeping track of how many carbohydrates you eat.  Eating carbohydrates naturally increases the amount of sugar (glucose) in the blood.  Counting how many carbohydrates you eat helps keep your blood glucose within normal limits, which helps you manage your diabetes.  A diet and nutrition specialist (registered dietitian) can help you make a meal plan and calculate how many carbohydrates you should have at each meal and snack. This information is not intended to replace advice given to you by your health care provider. Make sure you discuss any questions you have with your health care provider. Document Revised: 03/04/2017 Document Reviewed: 01/22/2016 Elsevier Patient Education  [place].

## 2020-05-28 ENCOUNTER — Other Ambulatory Visit: Payer: Self-pay | Admitting: Internal Medicine

## 2020-05-28 MED ORDER — INSULIN SYRINGES (DISPOSABLE) U-100 0.3 ML MISC
1.0000 | Freq: Two times a day (BID) | 3 refills | Status: DC
Start: 2020-05-28 — End: 2020-11-25

## 2020-05-28 MED ORDER — NOVOLIN 70/30 (70-30) 100 UNIT/ML ~~LOC~~ SUSP
15.0000 [IU] | Freq: Two times a day (BID) | SUBCUTANEOUS | 3 refills | Status: DC
Start: 2020-05-28 — End: 2020-09-03

## 2020-06-08 ENCOUNTER — Other Ambulatory Visit: Payer: Self-pay | Admitting: Gastroenterology

## 2020-07-02 ENCOUNTER — Telehealth: Payer: Self-pay | Admitting: Pharmacist

## 2020-07-02 ENCOUNTER — Other Ambulatory Visit: Payer: Self-pay

## 2020-07-02 ENCOUNTER — Encounter: Payer: Self-pay | Admitting: Internal Medicine

## 2020-07-02 ENCOUNTER — Ambulatory Visit (INDEPENDENT_AMBULATORY_CARE_PROVIDER_SITE_OTHER): Payer: PPO | Admitting: Internal Medicine

## 2020-07-02 VITALS — BP 126/64 | HR 56 | Temp 98.1°F | Resp 18 | Ht 74.0 in | Wt 219.2 lb

## 2020-07-02 DIAGNOSIS — R42 Dizziness and giddiness: Secondary | ICD-10-CM

## 2020-07-02 DIAGNOSIS — E118 Type 2 diabetes mellitus with unspecified complications: Secondary | ICD-10-CM | POA: Diagnosis not present

## 2020-07-02 DIAGNOSIS — I10 Essential (primary) hypertension: Secondary | ICD-10-CM

## 2020-07-02 DIAGNOSIS — I2510 Atherosclerotic heart disease of native coronary artery without angina pectoris: Secondary | ICD-10-CM | POA: Diagnosis not present

## 2020-07-02 NOTE — Progress Notes (Addendum)
Chronic Care Management Pharmacy Assistant   Name: Randy Castro.  MRN: [id number] DOB: 1951-05-31  Reason for Encounter: Disease State and Medication Review  Patient Questions:  1.  Have you seen any other providers since your last visit? No   2.  Any changes in your medicines or health? No    PCP : Colon Branch, MD   Their chronic conditions include: Hypertension, Hyperlipidemia/CAD, Diabetes, Depression, GERD/Hx of GI Bleed, Essential Tremor, Hypomagnesemia  No Office visits, Consults, or Hospitalizations since last CCM visit on 05-27-20.  Allergies:   Allergies  Allergen Reactions   Trulicity [Dulaglutide] Nausea And Vomiting    Medications: Outpatient Encounter Medications as of 07/02/2020  Medication Sig Note   aspirin EC 81 MG tablet Take 81 mg by mouth daily.    atorvastatin (LIPITOR) 80 MG tablet Take 1 tablet (80 mg total) by mouth at bedtime.    Blood Glucose Monitoring Suppl (ONE TOUCH ULTRA 2) w/Device KIT by Does not apply route.    carvedilol (COREG) 12.5 MG tablet Take 1 tablet (12.5 mg total) by mouth 2 (two) times daily with a meal.    citalopram (CELEXA) 20 MG tablet Take 1 tablet (20 mg total) by mouth daily.    clotrimazole-betamethasone (LOTRISONE) cream Apply topically 2 (two) times daily. 02/18/2017: Uses as needed   FEROSUL 325 (65 Fe) MG tablet TAKE 1 TABLET BY MOUTH DAILY    glucose blood (ONETOUCH ULTRA) test strip Check blood sugar 2-3 times daily.  Dx Code: E11.9    insulin NPH-regular Human (NOVOLIN 70/30) (70-30) 100 UNIT/ML injection Inject 15 Units into the skin 2 (two) times daily with a meal.    Insulin Syringes, Disposable, U-100 0.3 ML MISC 1 Device by Does not apply route in the morning and at bedtime.    losartan (COZAAR) 50 MG tablet Take 1 tablet (50 mg total) by mouth daily.    magnesium oxide (MAGNESIUM-OXIDE) 400 (241.3 Mg) MG tablet Take 1 tablet (400 mg total) by mouth 2 (two) times daily.    metFORMIN (GLUCOPHAGE) 1000 MG  tablet Take 1 tablet (1,000 mg total) by mouth 2 (two) times daily.    nitroGLYCERIN (NITROSTAT) 0.4 MG SL tablet Place 1 tablet (0.4 mg total) under the tongue every 5 (five) minutes as needed for chest pain. (Patient not taking: Reported on 07/02/2020) 05/04/2019: PRN   pantoprazole (PROTONIX) 40 MG tablet Take 1 tablet (40 mg total) by mouth 2 (two) times daily before a meal.    Potassium 99 MG TABS Take 1 tablet by mouth daily.    primidone (MYSOLINE) 50 MG tablet 3 in the AM, 2 in the PM    No facility-administered encounter medications on file as of 07/02/2020.    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   Abdominal pain 11/17/2018   Cardiac arrhythmia, unspecified 11/14/2017   Hypomagnesemia 11/14/2017   Anemia 11/14/2017   Bleeding skin mole 11/14/2017   OSA on CPAP 09/21/2017   Obesity 01/23/2016   PCP NOTES >>> 06/08/2015   Anxiety and depression  05/31/2013   Intermediate coronary syndrome ([place]) 12/22/2012   Unstable angina ([place]) 12/16/2012   Fatigue 04/26/2012   Annual physical exam 10/28/2011   Rash 10/28/2011   Coronary Artery Disease 08/28/2011   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   Palpitations 08/28/2011   NSTEMI (non-ST elevated myocardial infarction) ([place]) 08/07/2011   DJD (degenerative joint disease) 05/28/2011   Poorly controlled type 2 diabetes mellitus with circulatory disorder ([place])    Hyperlipidemia    HTN (hypertension)     Goals Addressed   None    Recent Relevant Labs: Lab Results    Component Value Date/Time   HGBA1C 9.7 (H) 04/01/2020 09:10 AM   HGBA1C 7.7 09/12/2019 12:00 AM   HGBA1C 8.2 04/20/2019 12:00 AM   MICROALBUR 1.72 08/05/2012 03:50 PM   MICROALBUR 1.0 04/13/2011 11:05 AM    Kidney Function Lab Results  Component Value Date/Time   CREATININE 0.66 04/15/2020 08:05 AM   CREATININE 0.65 11/30/2019 08:26 AM   CREATININE 1.20 06/15/2016 08:49 AM   CREATININE 0.97 05/18/2016 08:28 AM   GFR 119.80 04/15/2020 08:05 AM   GFRNONAA >60 11/06/2019 02:55 PM   GFRNONAA 85 01/02/2016 11:06 AM   GFRAA >60 11/06/2019 02:55 PM   GFRAA >89 01/02/2016 11:06 AM    Current antihyperglycemic regimen:  Novolin 70/30 15 units twice daily Metformin 1080m twice daily What recent interventions/DTPs have been made to improve glycemic control:  None Have there been any recent hospitalizations or ED visits since last visit with CPP? No Patient reports hypoglycemic symptoms, including Sweaty, Shaky and Nervous/irritable Patient denies hyperglycemic symptoms. How often are you checking your blood sugar? twice daily What are your blood sugars ranging? 240-250 in the afternoons During the week, how often does your blood glucose drop below 70?  yes about two weeks ago  at night Are you checking your feet daily/regularly?  Wife checks his feet daily.  Reports saw PCP and feet was checked  Adherence Review: reports he was trying to eat right but has lately been eating christmas cookies. Is the patient currently on a STATIN medication? Yes Atorvastatin 80 mg Is the patient currently on ACE/ARB medication? Yes Losartan 50 mg Does the patient have >5 day gap between last estimated fill dates? No     Follow-Up:  Pharmacist Review   C[place]ailand[place], RSummitPrimary care at MGradyPharmacist Assistant [phone] Reviewed by: KDe Blanch PharmD Clinical Pharmacist LLongboat KeyPrimary [facility] at [facility][phone]

## 2020-07-02 NOTE — Progress Notes (Signed)
Subjective:    Patient ID: Randy Castro., male    DOB: 20-Mar-1951, 69 y.o.   MRN: [id number]  DOS:  07/02/2020 Type of visit - description: f/u In general feeling well.  His only concern remains dizziness. Again this is going on for a while, described dizziness as a off balance feeling, typically when he is stand up or walk. He has good days and bad days regarding the symptoms. He has checked BPs and CBGs when he feels dizzy and they are not low. No recent fall. Again denies headache, diplopia, slurred speech or motor deficit. No chest pain, difficulty breathing or palpitations.  Notes from Endo and cardiology reviewed.  BP Readings from Last 3 Encounters:  07/02/20 126/64  05/08/20 140/64  04/30/20 120/64    Review of Systems Neuropathy?  Denies any painful feeling on the feet but states "the feeling "is not the same"    Past Medical History:  Diagnosis Date  . Anxiety   . Arthritis   . CAD (coronary artery disease)    a. NSTEMI 12/12: EF 40-45%. BTD:VVOHYWV balloon PTCA + Promus DES x 1 to mid LAD;  b. 11/2012: Cath with Cutting balloon PTCA  LAD for ISR to LAD, EF 55%;  c. 12/2012 Cath/PCI: LAD stents patent, 80 ost Diag (jailed)->PTCA, LCX/RCA patent.  . Essential hypertension   . GI bleed   . History of blood transfusion    was getting 4 units  . Hyperlipidemia   . Ischemic cardiomyopathy    a.  echo 08/06/11: dist ant wall, apical and septal and infero-apical HK, mild LVH, EF 40%, mild LAE, PASP 34, asc aorta mildly dilated, mild TR;  b.  Echo (3/13) showed recovery of LV systolic function with EF 37-10%, grade I diastolic dysfunction, mild MR.   . Myocardial infarction ([place])    twice with NSTEMI 2012  . Palpitations   . Type 2 diabetes mellitus ([place])     Past Surgical History:  Procedure Laterality Date  . ANTERIOR CERVICAL DECOMP/DISCECTOMY FUSION  in the 90s  . CHOLECYSTECTOMY  03/27/2019  . COLONOSCOPY  2021  . COLONOSCOPY WITH  ESOPHAGOGASTRODUODENOSCOPY (EGD)  10/2019  . CORONARY ANGIOPLASTY  12/15/2012; 12/22/2012  . CORONARY ANGIOPLASTY WITH STENT PLACEMENT  07/2011   "1" (12/22/2012)  . GASTRIC BYPASS  12/21/2017  . KNEE ARTHROSCOPY Left 12/31/2016  . LEFT HEART CATHETERIZATION WITH CORONARY ANGIOGRAM N/A 08/06/2011   Procedure: LEFT HEART CATHETERIZATION WITH CORONARY ANGIOGRAM;  Surgeon: Burnell Blanks, MD;  Location[facility] C[facility] LAB;  Service: Cardiovascular;  Laterality: N/A;  . LEFT HEART CATHETERIZATION WITH CORONARY ANGIOGRAM N/A 12/15/2012   Procedure: LEFT HEART CATHETERIZATION WITH CORONARY ANGIOGRAM;  Surgeon: Sherren Mocha, MD;  Location: [facility] CATH LAB;  Service: Cardiovascular;  Laterality: N/A;  . LEFT HEART CATHETERIZATION WITH CORONARY ANGIOGRAM N/A 12/22/2012   Procedure: LEFT HEART CATHETERIZATION WITH CORONARY ANGIOGRAM;  Surgeon: Sherren Mocha, MD;  Location: [facility] CATH LAB;  Service: Cardiovascular;  Laterality: N/A;  . PERCUTANEOUS CORONARY STENT INTERVENTION (PCI-S) Right 12/15/2012   Procedure: PERCUTANEOUS CORONARY STENT INTERVENTION (PCI-S);  Surgeon: Sherren Mocha, MD;  Location: [facility] CATH LAB;  Service: Cardiovascular;  Laterality: Right;    Allergies as of 07/02/2020      Reactions   Trulicity [dulaglutide] Nausea And Vomiting      Medication List       Accurate as of July 02, 2020 11:59 PM. If you have any questions, ask your nurse or doctor.  aspirin EC 81 MG tablet Take 81 mg by mouth daily.   atorvastatin 80 MG tablet Commonly known as: LIPITOR Take 1 tablet (80 mg total) by mouth at bedtime.   carvedilol 12.5 MG tablet Commonly known as: COREG Take 1 tablet (12.5 mg total) by mouth 2 (two) times daily with a meal.   citalopram 20 MG tablet Commonly known as: CELEXA Take 1 tablet (20 mg total) by mouth daily.   clotrimazole-betamethasone cream Commonly known as: LOTRISONE Apply topically 2 (two) times daily.   FeroSul 325 (65 FE) MG tablet Generic drug:  ferrous sulfate TAKE 1 TABLET BY MOUTH DAILY   Insulin Syringes (Disposable) U-100 0.3 ML Misc 1 Device by Does not apply route in the morning and at bedtime.   losartan 50 MG tablet Commonly known as: COZAAR Take 1 tablet (50 mg total) by mouth daily.   magnesium oxide 400 (241.3 Mg) MG tablet Commonly known as: MAGnesium-Oxide Take 1 tablet (400 mg total) by mouth 2 (two) times daily.   metFORMIN 1000 MG tablet Commonly known as: GLUCOPHAGE Take 1 tablet (1,000 mg total) by mouth 2 (two) times daily.   nitroGLYCERIN 0.4 MG SL tablet Commonly known as: NITROSTAT Place 1 tablet (0.4 mg total) under the tongue every 5 (five) minutes as needed for chest pain.   NovoLIN 70/30 (70-30) 100 UNIT/ML injection Generic drug: insulin NPH-regular Human Inject 15 Units into the skin 2 (two) times daily with a meal.   ONE TOUCH ULTRA 2 w/Device Kit by Does not apply route.   OneTouch Ultra test strip Generic drug: glucose blood Check blood sugar 2-3 times daily.  Dx Code: E11.9   pantoprazole 40 MG tablet Commonly known as: PROTONIX Take 1 tablet (40 mg total) by mouth 2 (two) times daily before a meal.   Potassium 99 MG Tabs Take 1 tablet by mouth daily.   primidone 50 MG tablet Commonly known as: MYSOLINE 3 in the AM, 2 in the PM          Objective:   Physical Exam BP 126/64 (BP Location: Right Arm, Patient Position: Sitting, Cuff Size: Normal)   Pulse (!) 56   Temp 98.1 F (36.7 C) (Oral)   Resp 18   Ht 6' 2"  (1.88 m)   Wt 219 lb 4 oz (99.5 kg)   SpO2 97%   BMI 28.15 kg/m   General:   Well developed, NAD, BMI noted. HEENT:  Normocephalic . Face symmetric, atraumatic Lungs:  CTA B Normal respiratory effort, no intercostal retractions, no accessory muscle use. Heart: RRR,  no murmur.  DM foot exam: No edema, good pedal pulses, pinprick examination with areas of numbness distally in both feet Skin: Not pale. Not jaundice Neurologic:  alert & oriented X3.   Speech normal, gait appropriate for age and unassisted Psych--  Cognition and judgment appear intact.  Cooperative with normal attention span and concentration.  Behavior appropriate. No anxious or depressed appearing.      Assessment    Assessment   DM : insulin dependent, uncontrolled, w/ CAD CHF. Intol Trulicity, used to see [facility] until 08/2019 HTN Hyperlipidemia CAD, CHF, ischemic cardiomyopathy, PVCs (Holter 2019 show 13.6% burden) NSTEMI in 12/12 (DES to LAD), EF 40%, unstable angina in 4/14 and 5/14 . Repeat echo in 3/13 showed EF 55-60%. 4/14 >> unstable angina and had 90% pLAD in-stent restenosis (Rxcutting balloon angioplasty). Readmitted, again with unstable angina, in 5/14. This time he had PTCA to 90% ostial D1 stenosis.  OSA  dx ~08-2017, om Cpap Anxiety-depression Essential Tremor dx 2019  Skin cancer: Right leg, status post excision 2016; Dr Allyson Sabal Morbid obesity:gastric sleeve, 11-2017 Dr Raul Del 09-2019 GI bleed, gastric ulcer, required transfusion  PLAN: HTN: Lisinopril was switched to losartan, subsequent BMP okay, BP today is very good.  No change DM: Since the last visit A1c was 9.7, referred to Endo, seen on 04/30/2020. CAD: Last cardiology visit 05/08/2020.  No changes on therapy made, next visit in 6 months Dizziness: See detailed description above, no associated with cardiac, neurology symptoms or low BP/CBGs. Could be related to diabetes, autonomic neuropathy, peripheral neuropathy, etc. Offered neuro referral, PT referral.  At the end agreed on self-care: he will be careful when he stands up, stay hydrated, will consider neuro referral if symptoms increase. GI bleed 09-2019: Denies nausea vomiting no change in the color of the stools. Preventive care: Had a flu shot and COVID vaccine booster. RTC 3 months    This visit occurred during the SARS-CoV-2 public health emergency.  Safety protocols were in place, including screening questions prior to the visit,  additional usage of staff PPE, and extensive cleaning of exam room while observing appropriate contact time as indicated for disinfecting solutions.

## 2020-07-02 NOTE — Progress Notes (Signed)
Pre visit review using our clinic review tool, if applicable. No additional management support is needed unless otherwise documented below in the visit note. 

## 2020-07-02 NOTE — Patient Instructions (Addendum)
Per our records you are due for an eye exam. Please contact your eye doctor to schedule an appointment. Please have them send copies of your office visit notes to [place]. Our fax number is [phone].   Check the  blood pressure  a week   BP GOAL is between 110/65 and  135/85. If it is consistently higher or lower, let me know     GO TO THE FRONT DESK, PLEASE SCHEDULE YOUR APPOINTMENTS Come back for   A physical exam in 3 months

## 2020-07-03 NOTE — Assessment & Plan Note (Signed)
HTN: Lisinopril was switched to losartan, subsequent BMP okay, BP today is very good.  No change DM: Since the last visit A1c was 9.7, referred to Endo, seen on 04/30/2020. CAD: Last cardiology visit 05/08/2020.  No changes on therapy made, next visit in 6 months Dizziness: See detailed description above, no associated with cardiac, neurology symptoms or low BP/CBGs. Could be related to diabetes, autonomic neuropathy, peripheral neuropathy, etc. Offered neuro referral, PT referral.  At the end agreed on self-care: he will be careful when he stands up, stay hydrated, will consider neuro referral if symptoms increase. GI bleed 09-2019: Denies nausea vomiting no change in the color of the stools. Preventive care: Had a flu shot and COVID vaccine booster. RTC 3 months

## 2020-07-08 ENCOUNTER — Other Ambulatory Visit: Payer: Self-pay | Admitting: Internal Medicine

## 2020-07-30 ENCOUNTER — Ambulatory Visit: Payer: PPO | Admitting: Internal Medicine

## 2020-08-02 ENCOUNTER — Telehealth: Payer: Self-pay | Admitting: Pharmacist

## 2020-08-02 ENCOUNTER — Other Ambulatory Visit: Payer: Self-pay

## 2020-08-02 MED ORDER — CLOTRIMAZOLE-BETAMETHASONE 1-0.05 % EX CREA: TOPICAL_CREAM | Freq: Two times a day (BID) | CUTANEOUS | 0 refills | Status: DC | PRN

## 2020-08-02 NOTE — Progress Notes (Addendum)
Chronic Care Management Pharmacy Assistant   Name: Randy Castro.  MRN: [id number] DOB: 15-May-1951  Reason for Encounter: HTN Disease State and Medication Review   PCP : Colon Branch, MD   Their chronic conditions include: Hypertension, Hyperlipidemia/CAD, Diabetes, Depression, GERD/Hx of GI Bleed, Essential Tremor, Hypomagnesemia  Allergies:   Allergies  Allergen Reactions   Trulicity [Dulaglutide] Nausea And Vomiting    Medications: Outpatient Encounter Medications as of 08/02/2020  Medication Sig Note   metFORMIN (GLUCOPHAGE) 1000 MG tablet Take 1 tablet (1,000 mg total) by mouth 2 (two) times daily with a meal.    aspirin EC 81 MG tablet Take 81 mg by mouth daily.    atorvastatin (LIPITOR) 80 MG tablet Take 1 tablet (80 mg total) by mouth at bedtime.    Blood Glucose Monitoring Suppl (ONE TOUCH ULTRA 2) w/Device KIT by Does not apply route.    carvedilol (COREG) 12.5 MG tablet Take 1 tablet (12.5 mg total) by mouth 2 (two) times daily with a meal.    citalopram (CELEXA) 20 MG tablet Take 1 tablet (20 mg total) by mouth daily.    clotrimazole-betamethasone (LOTRISONE) cream Apply topically 2 (two) times daily. 02/18/2017: Uses as needed   FEROSUL 325 (65 Fe) MG tablet TAKE 1 TABLET BY MOUTH DAILY    glucose blood (ONETOUCH ULTRA) test strip Check blood sugar 2-3 times daily.  Dx Code: E11.9    insulin NPH-regular Human (NOVOLIN 70/30) (70-30) 100 UNIT/ML injection Inject 15 Units into the skin 2 (two) times daily with a meal.    Insulin Syringes, Disposable, U-100 0.3 ML MISC 1 Device by Does not apply route in the morning and at bedtime.    losartan (COZAAR) 50 MG tablet Take 1 tablet (50 mg total) by mouth daily.    magnesium oxide (MAGNESIUM-OXIDE) 400 (241.3 Mg) MG tablet Take 1 tablet (400 mg total) by mouth 2 (two) times daily.    nitroGLYCERIN (NITROSTAT) 0.4 MG SL tablet Place 1 tablet (0.4 mg total) under the tongue every 5 (five) minutes as needed for chest pain.  (Patient not taking: Reported on 07/02/2020) 05/04/2019: PRN   pantoprazole (PROTONIX) 40 MG tablet Take 1 tablet (40 mg total) by mouth 2 (two) times daily before a meal.    Potassium 99 MG TABS Take 1 tablet by mouth daily.    primidone (MYSOLINE) 50 MG tablet 3 in the AM, 2 in the PM    No facility-administered encounter medications on file as of 08/02/2020.    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   Abdominal pain 11/17/2018   Cardiac arrhythmia, unspecified 11/14/2017   Hypomagnesemia 11/14/2017   Anemia 11/14/2017   Bleeding skin mole 11/14/2017   OSA on CPAP 09/21/2017   Obesity 01/23/2016   PCP NOTES >>> 06/08/2015   Anxiety and depression 05/31/2013   Intermediate coronary syndrome ([place]) 12/22/2012   Unstable angina ([place]) 12/16/2012   Fatigue 04/26/2012   Annual physical exam 10/28/2011   Rash 10/28/2011   Coronary Artery Disease 08/28/2011   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  Palpitations 08/28/2011   NSTEMI (non-ST elevated myocardial infarction) ([place]) 08/07/2011   DJD (degenerative joint disease) 05/28/2011   Poorly controlled type 2 diabetes mellitus with circulatory disorder (H[facility])    Hyperlipidemia    HTN (hypertension)     Goals Addressed   None    Reviewed chart prior to disease state call. Spoke with patient regarding BP  Recent Office Vitals: BP Readings from Last 3 Encounters:  07/02/20 126/64  05/08/20 140/64  04/30/20 120/64   Pulse Readings from Last 3 Encounters:  07/02/20 (!) 56   05/08/20 68  04/30/20 (!) 57    Wt Readings from Last 3 Encounters:  07/02/20 219 lb 4 oz (99.5 kg)  05/08/20 219 lb (99.3 kg)  04/30/20 218 lb 6.4 oz (99.1 kg)     Kidney Function Lab Results  Component Value Date/Time   CREATININE 0.66 04/15/2020 08:05 AM   CREATININE 0.65 11/30/2019 08:26 AM   CREATININE 1.20 06/15/2016 08:49 AM   CREATININE 0.97 05/18/2016 08:28 AM   GFR 119.80 04/15/2020 08:05 AM   GFRNONAA >60 11/06/2019 02:55 PM   GFRNONAA 85 01/02/2016 11:06 AM   GFRAA >60 11/06/2019 02:55 PM   GFRAA >89 01/02/2016 11:06 AM    BMP Latest Ref Rng & Units 04/15/2020 11/30/2019 11/06/2019  Glucose 70 - 99 mg/dL 196(H) 174(H) 195(H)  BUN 6 - 23 mg/dL 9 7 11   Creatinine 0.40 - 1.50 mg/dL 0.66 0.65 0.70  Sodium 135 - 145 mEq/L 138 140 141  Potassium 3.5 - 5.1 mEq/L 4.2 3.6 3.4(L)  Chloride 96 - 112 mEq/L 101 103 103  CO2 19 - 32 mEq/L 29 30 29   Calcium 8.4 - 10.5 mg/dL 8.8 8.7 8.8(L)    Current antihypertensive regimen:  Carvedilol 12.5 mg twice daily Losartan 50 mg daily  How often are you checking your Blood Pressure? infrequently Patient has not been checking his BP. Reminded him of plan created with the clinical pharmacist to check his BP one to two times a week and record.  Current home BP readings: none  What recent interventions/DTPs have been made by any provider to improve Blood Pressure control since last CPP Visit: none  Any recent hospitalizations or ED visits since last visit with CPP? No   What diet changes have been made to improve Blood Pressure Control?  Patient has not been feeling well since the weekend and does not have an appetite lately. Believes he has the flu.  What exercise is being done to improve your Blood Pressure Control?  Usually active in his wood workshop and walks some in the stores. Reports having balance issues. He also  Adherence Review: Is the patient currently on ACE/ARB medication? Yes Losartan 50 mg daily Does the patient  have >5 day gap between last estimated fill dates? No   Reviewed chart for medication changes ahead of medication coordination call.  No OVs, Consults, or hospital visits since last care coordination call  No medication changes indicated.  BP Readings from Last 3 Encounters:  07/02/20 126/64  05/08/20 140/64  04/30/20 120/64    Lab Results  Component Value Date   HGBA1C 9.7 (H) 04/01/2020     Patient obtains medications through Adherence Packaging  90 Days   Last delivery included:  Atorvastatin 80 mg tabs; 60 day supply on 06-12-20 Metformin 100 mg tab; 42 day supply on 06-28-20 Losartan 50 mg tabs; 42 day supply on 06-28-20 Carvedilol 12.5 mg tabs; 34 day supply on 06-28-20 Pantoprazole 40 mg tab;  34 day supply on 06-28-20 One Touch Test Strips on 06-28-20  This will be the patient's first adherence delivery.  Patient is due for next adherence delivery on: 08-09-2020. Called patient and reviewed medications and coordinated delivery.  This delivery to include: Atorvastatin 80 mg tab with Breakfast Carvedilol 12.5 mg tab with Breakfast, one tat Bedtime Citalopram 20 mg tab with Breakfast clotrimazole-betamethasone cream (request was sent to PCP for script to be sent to [facility]) Losartan 50 mg tab at Bedtime Magnesium Oxide 400 mg one tab with Breakfast, one at Bedtime Metformin 1000 mg tab with Breakfast, one at Bedtime Pantoprazole 40 mg tab with Breakfast, one at Bedtime Primidone 50 mg tab; three tabs with Breakfast, two tabs at Bedtime  Syringes 3 MI 21g (if not more than $12 a box)  Patient declined the following medications: FEROSUL 325 (65 Fe) MG tablet (wait until next month) One Touch Test Strips Novolin Inj 70/30 (pt will get at [facility] $24 for 30 ds)  Patient does not need refills at this time.  Confirmed delivery date of 08-09-2020, advised patient that pharmacy will contact them the morning of delivery.  Follow-Up:  Pharmacist Review   Fanny [name], [place] Pharmacist Assistant [phone]  7 minutes spent in review, coordination, and documentation.   Reviewed by: De Blanch, PharmD, BCACP Clinical Pharmacist [facility] [phone]

## 2020-08-07 ENCOUNTER — Other Ambulatory Visit: Payer: Self-pay | Admitting: Internal Medicine

## 2020-08-14 ENCOUNTER — Encounter: Payer: Self-pay | Admitting: Internal Medicine

## 2020-08-22 ENCOUNTER — Other Ambulatory Visit: Payer: Self-pay | Admitting: Internal Medicine

## 2020-08-26 NOTE — Chronic Care Management (AMB) (Signed)
Chronic Care Management Pharmacy  Name: Randy Brabec.  MRN: [id number] DOB: 05/21/1951  Chief Complaint/ HPI  Randy Foots.,  70 y.o. , male presents for their Follow-Up CCM visit with the clinical pharmacist via telephone due to COVID-19 Pandemic.  PCP : Randy Branch, MD  Their chronic conditions include: Hypertension, Hyperlipidemia/CAD, Diabetes, Depression, GERD/Hx of GI Bleed, Essential Tremor, Hypomagnesemia  Office Visits: 07/02/20: Visit w/ Dr. Larose Kells - Reports of dizziness, but decision to monitor for now. Pt doing well with switch to losartan from lisinopril. No med changes noted. RTC 3 months.   Consult Visit: None since last CCM visit on 05/27/20.   Medications: Outpatient Encounter Medications as of 08/27/2020  Medication Sig Note  . insulin NPH-regular Human (NOVOLIN 70/30) (70-30) 100 UNIT/ML injection Inject 15 Units into the skin 2 (two) times daily with a meal.   . metFORMIN (GLUCOPHAGE) 1000 MG tablet Take 1 tablet (1,000 mg total) by mouth 2 (two) times daily with a meal.   . aspirin EC 81 MG tablet Take 81 mg by mouth daily.   Marland Kitchen atorvastatin (LIPITOR) 80 MG tablet Take 1 tablet (80 mg total) by mouth at bedtime.   . Blood Glucose Monitoring Suppl (ONE TOUCH ULTRA 2) w/Device KIT by Does not apply route.   . carvedilol (COREG) 12.5 MG tablet Take 1 tablet (12.5 mg total) by mouth 2 (two) times daily with a meal.   . citalopram (CELEXA) 20 MG tablet Take 1 tablet (20 mg total) by mouth daily.   . clotrimazole-betamethasone (LOTRISONE) cream Apply topically 2 (two) times daily as needed.   . FEROSUL 325 (65 Fe) MG tablet TAKE 1 TABLET BY MOUTH DAILY   . glucose blood (ONETOUCH ULTRA) test strip Check blood sugar 2-3 times daily.  Dx Code: E11.9   . Insulin Syringes, Disposable, U-100 0.3 ML MISC 1 Device by Does not apply route in the morning and at bedtime.   Marland Kitchen losartan (COZAAR) 50 MG tablet Take 1 tablet (50 mg total) by mouth daily.   . magnesium oxide  (MAGNESIUM-OXIDE) 400 (241.3 Mg) MG tablet Take 1 tablet (400 mg total) by mouth 2 (two) times daily.   . nitroGLYCERIN (NITROSTAT) 0.4 MG SL tablet Place 1 tablet (0.4 mg total) under the tongue every 5 (five) minutes as needed for chest pain. (Patient not taking: Reported on 07/02/2020) 05/04/2019: PRN  . pantoprazole (PROTONIX) 40 MG tablet Take 1 tablet (40 mg total) by mouth 2 (two) times daily before a meal.   . Potassium 99 MG TABS Take 1 tablet by mouth daily.   . primidone (MYSOLINE) 50 MG tablet 3 in the AM, 2 in the PM    No facility-administered encounter medications on file as of 08/27/2020.   SDOH Screenings   Alcohol Screen: Not on file  Depression (PHQ2-9): Low Risk   . PHQ-2 Score: 0  Financial Resource Strain: Low Risk   . Difficulty of Paying Living Expenses: Not hard at all  Food Insecurity: Not on file  Housing: Not on file  Physical Activity: Not on file  Social Connections: Not on file  Stress: Not on file  Tobacco Use: Medium Risk  . Smoking Tobacco Use: Former Smoker  . Smokeless Tobacco Use: Never Used  Transportation Needs: Not on file     Current Diagnosis/Assessment:  Goals Addressed            This Visit's Progress   . Chronic Care Management Pharmacy Plan  CARE PLAN ENTRY (see longitudinal plan of care for additional care plan information)  Current Barriers:  . Chronic Disease Management support, education, and care coordination needs related to Hypertension, Hyperlipidemia/CAD, Diabetes, Depression, GERD/Hx of GI Bleed, Essential Tremor, Hypomagnesemia   Hypertension BP Readings from Last 3 Encounters:  07/02/20 126/64  05/08/20 140/64  04/30/20 120/64   . Pharmacist Clinical Goal(s): o Over the next 90 days, patient will work with PharmD and providers to maintain BP goal <130/80 . Current regimen:  . Carvedilol 12.28m twice daily . Losartan 551mdaily . Interventions: o Requested patient check his BP 1-2 times per week and  record . Patient self care activities - Over the next 90 days, patient will: o Check BP 1-2 times per week, document, and provide at future appointments o Ensure daily salt intake < 2300 mg/Randy Castro  Hyperlipidemia/CAD Lab Results  Component Value Date/Time   LDLCALC 24 04/01/2020 09:10 AM   LDLDIRECT 145.0 04/26/2018 10:26 AM   . Pharmacist Clinical Goal(s): o Over the next 90 days, patient will work with PharmD and providers to maintain LDL goal < 70 . Current regimen:  . Aspirin 8188maily . Atorvastatin 80m54mily . Nitroglycerin 0.4mg 29mneeded . Patient self care activities - Over the next 90 days, patient will: o Maintain cholesterol medication regimen.   Diabetes Lab Results  Component Value Date/Time   HGBA1C 9.7 (H) 04/01/2020 09:10 AM   HGBA1C 7.7 09/12/2019 12:00 AM   HGBA1C 8.2 04/20/2019 12:00 AM   . Pharmacist Clinical Goal(s): o Over the next 180 days, patient will work with PharmD and providers to achieve A1c goal <7% . Current regimen:  . Novolin 70/30 15 units twice daily . Metformin 1000mg 36me daily . Interventions: o Discussed diet and exercise o Created goals to help improve blood sugar . Patient self care activities - Over the next 90 days, patient will: o Check blood sugar twice daily, document, and provide at future appointments o Contact provider with any episodes of hypoglycemia o Eat fewer snacks at night time while watching TV or replace snacks with healthier alternatives (popcorn w/o butter, sugar free cookies) o Increase vegetable intake o Replace fried foods with baked foods  Hypomagnesemia . Pharmacist Clinical Goal(s) o Over the next 90 days, patient will work with PharmD and providers to maintain magnesium within normal limits . Current regimen:  o Magnesium oxide 400mg t68m daily . Interventions: o Consider repeat Magnesium level at next office visit . Patient self care activities - Over the next 90 days, patient will: o Maintain  magnesium medication regimen o Complete magnesium level at next office visit  Health Maintenance  . Pharmacist Clinical Goal(s) o Over the next 90 days, patient will work with PharmD and providers to complete health maintenance screenings/vaccinations . Interventions: o Recommended patient complete Flu vaccine at next office visit or at pharmacy o Recommended patient complete Shingrix vaccine series at pharmacy . Patient self care activities - Over the next 90 days, patient will: o Complete Flu Vaccine o Complete Shingrix Vaccine Series    Medication management . Pharmacist Clinical Goal(s): o Over the next 90 days, patient will work with PharmD and providers to maintain optimal medication adherence . Current pharmacy: [facility] switched to [facility] . Interventions o Comprehensive medication review performed. o Utilize [facility] pharmacy for medication synchronization, packaging and delivery . Patient self care activities - Over the next 90 days, patient will: o Focus on medication adherence by filling and taking medications appropriately  o  Take medications as prescribed o Report any questions or concerns to PharmD and/or provider(s)  Please see past updates related to this goal by clicking on the "Past Updates" button in the selected goal        Social Hx:  Does wood work daily. Has a wood shop in his basement. Him and his wife make signs, bowls, cabinets Married 50 years. 2 kids, 3 grandkids  Hypertension   BP goal is:  <130/80  Office blood pressures are  BP Readings from Last 3 Encounters:  07/02/20 126/64  05/08/20 140/64  04/30/20 120/64   Patient checks BP at home several times per month Patient home BP readings are ranging: Unable to assess  Patient has failed these meds in the past: None noted  Patient is currently controlled on the following medications:  . Carvedilol 12.62m twice daily . Losartan 523mdaily  We discussed BP goal and checking BP    Update 08/27/20 Not checking. States he needs a new cuff. Denies headaches and chest pains. States he has dizziness at baseline and has discussed this with Dr. PaLarose KellsFeels "off balance" "stumbly". Will follow up on this soon.  Plan -Continue current medications  -Check BP 1-2 times per week    Hyperlipidemia/CAD   Followed by Cardio (Dr. McDomenic Polite LDL goal <70  Lipid Panel     Component Value Date/Time   CHOL 78 04/01/2020 0910   TRIG 106.0 04/01/2020 0910   HDL 32.80 (L) 04/01/2020 0910   LDLCALC 24 04/01/2020 0910   LDLDIRECT 145.0 04/26/2018 1026    Hepatic Function Latest Ref Rng & Units 11/30/2019 11/06/2019 10/31/2019  Total Protein 6.0 - 8.3 g/dL 5.7(L) 6.3(L) 5.4(L)  Albumin 3.5 - 5.2 g/dL 3.9 3.7 3.5  AST 0 - 37 U/L 33 40 26  ALT 0 - 53 U/L 42 45(H) 49  Alk Phosphatase 39 - 117 U/L 80 76 76  Total Bilirubin 0.2 - 1.2 mg/dL 0.5 0.4 0.4  Bilirubin, Direct <=0.2 mg/dL - - -     The ASCVD Risk score (GoKermit et al., 2013) failed to calculate for the following reasons:   The patient has a prior MI or stroke diagnosis   Patient has failed these meds in past: Plavix (GI bleed) Patient is currently controlled on the following medications:  . Aspirin 8119maily . Atorvastatin 41m91mily . Nitroglycerin 0.4mg 30mneeded  Update 08/27/20 Tolerating regimen. Hasn't needed nitroglycerin.   Plan -Continue current medications  Diabetes   Followed by Endo (Dr. ShamlKelton Pillarc goal <7%  Recent Relevant Labs: Lab Results  Component Value Date/Time   HGBA1C 9.7 (H) 04/01/2020 09:10 AM   HGBA1C 7.7 09/12/2019 12:00 AM   HGBA1C 8.2 04/20/2019 12:00 AM   GFR 119.80 04/15/2020 08:05 AM   GFR 122.07 11/30/2019 08:26 AM   MICROALBUR 1.72 08/05/2012 03:50 PM   MICROALBUR 1.0 04/13/2011 11:05 AM    Last diabetic Eye exam:  Lab Results  Component Value Date/Time   HMDIABEYEEXA Retinopathy (A) 07/19/2019 12:00 AM    Last diabetic Foot exam:  Lab Results   Component Value Date/Time   HMDIABFOOTEX Done 03/19/2019 12:00 AM   Recorded 04/30/20 by Dr. ShamlKelton Pillarcking BG: Daily  Recent FBG Readings: around 200  Patient has failed these meds in past: Trulicity (n/v) Patient is currently uncontrolled on the following medications: . Novolin 70/30 15 units twice daily . Metformin 1000mg 59me daily  Diet: Had gastric bypass Eats out about twice a week  Was encouraged to drink protein drink B - Veggie omlette L - Sandwiches, veggies D - Boiled chicken, salads, spaghetti Snacks - "I eat too many" eats most of them at night when watching TV(cheese crackers, pork rinds, popcorn, "I can tear up a cookie") Drinks - water, coffee, occassionally sweet tea or a soft drink Replaced regular hot dogs with chicken dogs  Exercise: "Stroll around", yard work, no regular routine  Realizes that his diet is largest contributor to his elevated BG. He states he's trying to get off all the snacks. He also wants to eat scheduled meals. He wants to increase veggies, change fried foods to baked, is open to changing diet.  We discussed: diet and exercise extensively   Update 08/27/20 Readings from November per patient 216 196 150 146 189 146 Avg 173.8  Hasn't checked in the last few days. Doesn't record BG. Has BG stored in glucometer. Reminded patient of visit with Dr. Kelton Pillar next week.  "The Christmas cookies really get to me" States he is starting to cut back on night time snacking and when he does snack he is eating plain popcorn without butter.  He feels he is down 4-5lbs.  States he ate a vegetable plate at [employer] and is eating more veggies at home.  Still working on [employer] vs frying.  Eating toast without butter.   Plan -Continue current medications  -Eat fewer snacks at night time while watching TV or replace snacks with healthier alternatives (popcorn w/o butter, sugar free cookies) -Increase vegetable intake -Replace fried  foods with baked foods   Vaccines   Reviewed and discussed patient's vaccination history.    Immunization History  Administered Date(s) Administered  . Fluad Quad(high Dose 65+) 05/04/2019  . Influenza Split 06/30/2011  . Influenza, High Dose Seasonal PF 05/31/2013, 05/27/2017, 06/07/2018  . Influenza, Seasonal, Injecte, Preservative Fre 07/28/2012  . Influenza,inj,Quad PF,6+ Mos 05/29/2014, 06/07/2015, 04/23/2016  . Influenza-Unspecified 05/24/2020  . PFIZER SARS-COV-2 Vaccination 10/11/2019, 11/01/2019, 05/20/2020  . Pneumococcal Conjugate-13 12/05/2014  . Pneumococcal Polysaccharide-23 08/07/2011, 10/12/2018  . Tdap 05/31/2013  . Zoster 11/28/2012   Received Covid vaccine booster 05/20/20 at [facility] [place].  Update 08/27/20 Received flu vaccine Shingrix vaccine? Not yet  Plan -Recommended patient receive Shingrix vaccine in pharmacy.   Medication Management   Pt uses [facility] pharmacy for all medications Uses pill box? Yes Pt endorses missing dose about once a month  Miscellaneous Meds Clotrimazole-betamethasone twice daily Ferrous sulfate 43m daily Potassium 94mdaily  We discussed: Discussed benefits of medication synchronization, packaging and delivery as well as enhanced pharmacist oversight with [facility].  Plan  Utilize [facility] pharmacy for medication synchronization, packaging and delivery   He is enjoying his adherence packaging. Reviewed patient's [facility] medication and Epic medication profile assuring there are no discrepancies or gaps in therapy. Confirmed all fill dates appropriate and verified with patient that there is a sufficient quantity of all prescribed medications at home. Informed patient to call me any time if needing medications before scheduled deliveries. The next anticipated medication sync date is 11/12/20.   Follow up:  1 month blood sugar check in 3 month phone visit  KaDe BlanchPharmD, BCACP Clinical Pharmacist LeRio Granderimary  [phone]

## 2020-08-27 ENCOUNTER — Ambulatory Visit: Payer: PPO | Admitting: Pharmacist

## 2020-08-27 DIAGNOSIS — E7800 Pure hypercholesterolemia, unspecified: Secondary | ICD-10-CM

## 2020-08-27 DIAGNOSIS — I10 Essential (primary) hypertension: Secondary | ICD-10-CM

## 2020-08-27 DIAGNOSIS — E1165 Type 2 diabetes mellitus with hyperglycemia: Secondary | ICD-10-CM

## 2020-08-27 DIAGNOSIS — E1159 Type 2 diabetes mellitus with other circulatory complications: Secondary | ICD-10-CM

## 2020-08-27 NOTE — Patient Instructions (Addendum)
Visit Information  Goals Addressed            This Visit's Progress   . Chronic Care Management Pharmacy Plan       CARE PLAN ENTRY (see longitudinal plan of care for additional care plan information)  Current Barriers:  . Chronic Disease Management support, education, and care coordination needs related to Hypertension, Hyperlipidemia/CAD, Diabetes, Depression, GERD/Hx of GI Bleed, Essential Tremor, Hypomagnesemia   Hypertension BP Readings from Last 3 Encounters:  07/02/20 126/64  05/08/20 140/64  04/30/20 120/64   . Pharmacist Clinical Goal(s): o Over the next 90 days, patient will work with PharmD and providers to maintain BP goal <130/80 . Current regimen:  . Carvedilol 12.5mg  twice daily . Losartan 50mg  daily . Interventions: o Requested patient check his BP 1-2 times per week and record . Patient self care activities - Over the next 90 days, patient will: o Check BP 1-2 times per week, document, and provide at future appointments o Ensure daily salt intake < 2300 mg/Tashona Calk  Hyperlipidemia/CAD Lab Results  Component Value Date/Time   LDLCALC 24 04/01/2020 09:10 AM   LDLDIRECT 145.0 04/26/2018 10:26 AM   . Pharmacist Clinical Goal(s): o Over the next 90 days, patient will work with PharmD and providers to maintain LDL goal < 70 . Current regimen:  . Aspirin 81mg  daily . Atorvastatin 80mg  daily . Nitroglycerin 0.4mg  as needed . Patient self care activities - Over the next 90 days, patient will: o Maintain cholesterol medication regimen.   Diabetes Lab Results  Component Value Date/Time   HGBA1C 9.7 (H) 04/01/2020 09:10 AM   HGBA1C 7.7 09/12/2019 12:00 AM   HGBA1C 8.2 04/20/2019 12:00 AM   . Pharmacist Clinical Goal(s): o Over the next 180 days, patient will work with PharmD and providers to achieve A1c goal <7% . Current regimen:  . Novolin 70/30 15 units twice daily . Metformin 1000mg  twice daily . Interventions: o Discussed diet and exercise o Created  goals to help improve blood sugar . Patient self care activities - Over the next 90 days, patient will: o Check blood sugar twice daily, document, and provide at future appointments o Contact provider with any episodes of hypoglycemia o Eat fewer snacks at night time while watching TV or replace snacks with healthier alternatives (popcorn w/o butter, sugar free cookies) o Increase vegetable intake o Replace fried foods with baked foods  Hypomagnesemia . Pharmacist Clinical Goal(s) o Over the next 90 days, patient will work with PharmD and providers to maintain magnesium within normal limits . Current regimen:  o Magnesium oxide 400mg  twice daily . Interventions: o Consider repeat Magnesium level at next office visit . Patient self care activities - Over the next 90 days, patient will: o Maintain magnesium medication regimen o Complete magnesium level at next office visit  Health Maintenance  . Pharmacist Clinical Goal(s) o Over the next 90 days, patient will work with PharmD and providers to complete health maintenance screenings/vaccinations . Interventions: o Recommended patient complete Flu vaccine at next office visit or at pharmacy o Recommended patient complete Shingrix vaccine series at pharmacy . Patient self care activities - Over the next 90 days, patient will: o Complete Flu Vaccine o Complete Shingrix Vaccine Series    Medication management . Pharmacist Clinical Goal(s): o Over the next 90 days, patient will work with PharmD and providers to maintain optimal medication adherence . [facility] . Interventions o Comprehensive medication review performed. o Utilize [facility] pharmacy  for medication synchronization, packaging and delivery . Patient self care activities - Over the next 90 days, patient will: o Focus on medication adherence by filling and taking medications appropriately  o Take medications as prescribed o Report any  questions or concerns to PharmD and/or provider(s)  Please see past updates related to this goal by clicking on the "Past Updates" button in the selected goal         The patient verbalized understanding of instructions, educational materials, and care plan provided today and agreed to receive a mailed copy of patient instructions, educational materials, and care plan.   Telephone follow up appointment with pharmacy team member scheduled for: 11/25/2020  Dannielle Karvonen Xyla Leisner, [facility]

## 2020-09-02 ENCOUNTER — Other Ambulatory Visit: Payer: Self-pay

## 2020-09-02 NOTE — Progress Notes (Unsigned)
Name: Randy Castro.  Age/ Sex: 70 y.o., male   MRN/ DOB: [id number], 1951/07/04     PCP: Colon Branch, MD   Reason for Endocrinology Evaluation: Type 2 Diabetes Mellitus  Initial Endocrine Consultative Visit: 04/30/2020    PATIENT IDENTIFIER: Randy Castro. is a 70 y.o. male with a past medical history of T2DM, CAD, Dyslipidemia and HTN. The patient has followed with Endocrinology clinic since 04/30/2020 for consultative assistance with management of his diabetes.  DIABETIC HISTORY:  Mr. Comes was diagnosed with DM in 1998 , he has been on Janumet,  Glimepiride and Trulicity with reported N/V with Trulicity.Was put on insulin in 2012 following admission for NSTEMI . His hemoglobin A1c has ranged from 7.3% in 2013, peaking at 13.4% in 2019   Pt was established with Dr. Cruzita Lederer from 2013 to 2014   On his initial visit to our clinic his A1c was   SUBJECTIVE:   During the last visit (04/30/2020): A1c 9.7% stopped Novolin- N and started insulin mix and continued metformin    Today (09/03/2020): [name] is here for a follow up on diabetes.  He checks his blood sugars occasionally. The patient has not had hypoglycemic episodes since the last clinic visit, he did not bring his meter today.   Denies nausea or vomiting    HOME DIABETES REGIMEN:   Metformin 1000 mg BID   Novolin Mix 15 units BID    Statin: yes ACE-I/ARB: yes    METER DOWNLOAD SUMMARY: Did not bring    DIABETIC COMPLICATIONS: Microvascular complications:    Denies: CKD, retinopathy , neuropathy   Last Eye Exam: Completed 2021  Macrovascular complications:   CAD (S/P PCI )   Denies: CVA, PVD   HISTORY:  Past Medical History:  Past Medical History:  Diagnosis Date  . Anxiety   . Arthritis   . CAD (coronary artery disease)    a. NSTEMI 12/12: EF 40-45%. UPJ:SRPRXYV balloon PTCA + Promus DES x 1 to mid LAD;  b. 11/2012: Cath with Cutting balloon PTCA  LAD for ISR to LAD, EF 55%;  c. 12/2012  Cath/PCI: LAD stents patent, 80 ost Diag (jailed)->PTCA, LCX/RCA patent.  . Essential hypertension   . GI bleed   . History of blood transfusion    was getting 4 units  . Hyperlipidemia   . Ischemic cardiomyopathy    a.  echo 08/06/11: dist ant wall, apical and septal and infero-apical HK, mild LVH, EF 40%, mild LAE, PASP 34, asc aorta mildly dilated, mild TR;  b.  Echo (3/13) showed recovery of LV systolic function with EF 85-92%, grade I diastolic dysfunction, mild MR.   . Myocardial infarction ([place])    twice with NSTEMI 2012  . Palpitations   . Type 2 diabetes mellitus ([place])    Past Surgical History:  Past Surgical History:  Procedure Laterality Date  . ANTERIOR CERVICAL DECOMP/DISCECTOMY FUSION  in the 90s  . CHOLECYSTECTOMY  03/27/2019  . COLONOSCOPY  2021  . COLONOSCOPY WITH ESOPHAGOGASTRODUODENOSCOPY (EGD)  10/2019  . CORONARY ANGIOPLASTY  12/15/2012; 12/22/2012  . CORONARY ANGIOPLASTY WITH STENT PLACEMENT  07/2011   "1" (12/22/2012)  . GASTRIC BYPASS  12/21/2017  . KNEE ARTHROSCOPY Left 12/31/2016  . LEFT HEART CATHETERIZATION WITH CORONARY ANGIOGRAM N/A 08/06/2011   Procedure: LEFT HEART CATHETERIZATION WITH CORONARY ANGIOGRAM;  Surgeon: Burnell Blanks, MD;  Location: [facility] CATH LAB;  Service: Cardiovascular;  Laterality: N/A;  . LEFT HEART  CATHETERIZATION WITH CORONARY ANGIOGRAM N/A 12/15/2012   Procedure: LEFT HEART CATHETERIZATION WITH CORONARY ANGIOGRAM;  Surgeon: Sherren Mocha, MD;  Location: [facility] CATH LAB;  Service: Cardiovascular;  Laterality: N/A;  . LEFT HEART CATHETERIZATION WITH CORONARY ANGIOGRAM N/A 12/22/2012   Procedure: LEFT HEART CATHETERIZATION WITH CORONARY ANGIOGRAM;  Surgeon: Sherren Mocha, MD;  Location: [facility] CATH LAB;  Service: Cardiovascular;  Laterality: N/A;  . PERCUTANEOUS CORONARY STENT INTERVENTION (PCI-S) Right 12/15/2012   Procedure: PERCUTANEOUS CORONARY STENT INTERVENTION (PCI-S);  Surgeon: Sherren Mocha, MD;  Location: [facility] CATH LAB;  Service:  Cardiovascular;  Laterality: Right;    Social History:  reports that he quit smoking about 28 years ago. His smoking use included cigarettes. He has a 25.00 pack-year smoking history. He has never used smokeless tobacco. He reports current alcohol use. He reports that he does not use drugs. Family History:  Family History  Problem Relation Age of Onset  . Diabetes Brother   . Lung cancer Brother   . Diabetes Mother   . Coronary artery disease Father   . Heart attack Father 58  . Diabetes Sister   . Coronary artery disease Sister   . Colon cancer Neg Hx   . Prostate cancer Neg Hx   . Stroke Neg Hx   . Esophageal cancer Neg Hx   . Colon polyps Neg Hx   . Rectal cancer Neg Hx   . Stomach cancer Neg Hx      HOME MEDICATIONS: Allergies as of 09/03/2020      Reactions   Trulicity [dulaglutide] Nausea And Vomiting      Medication List       Accurate as of September 03, 2020 11:33 AM. If you have any questions, ask your nurse or doctor.        aspirin EC 81 MG tablet Take 81 mg by mouth daily.   atorvastatin 80 MG tablet Commonly known as: LIPITOR Take 1 tablet (80 mg total) by mouth at bedtime.   carvedilol 12.5 MG tablet Commonly known as: COREG Take 1 tablet (12.5 mg total) by mouth 2 (two) times daily with a meal.   citalopram 20 MG tablet Commonly known as: CELEXA Take 1 tablet (20 mg total) by mouth daily.   clotrimazole-betamethasone cream Commonly known as: LOTRISONE Apply topically 2 (two) times daily as needed.   FeroSul 325 (65 FE) MG tablet Generic drug: ferrous sulfate TAKE 1 TABLET BY MOUTH DAILY   Insulin Syringes (Disposable) U-100 0.3 ML Misc 1 Device by Does not apply route in the morning and at bedtime.   losartan 50 MG tablet Commonly known as: COZAAR Take 1 tablet (50 mg total) by mouth daily.   magnesium oxide 400 (241.3 Mg) MG tablet Commonly known as: MAGnesium-Oxide Take 1 tablet (400 mg total) by mouth 2 (two) times daily.    metFORMIN 1000 MG tablet Commonly known as: GLUCOPHAGE Take 1 tablet (1,000 mg total) by mouth 2 (two) times daily with a meal.   nitroGLYCERIN 0.4 MG SL tablet Commonly known as: NITROSTAT Place 1 tablet (0.4 mg total) under the tongue every 5 (five) minutes as needed for chest pain.   NovoLIN 70/30 (70-30) 100 UNIT/ML injection Generic drug: insulin NPH-regular Human Inject 15 Units into the skin 2 (two) times daily with a meal.   ONE TOUCH ULTRA 2 w/Device Kit by Does not apply route.   OneTouch Ultra test strip Generic drug: glucose blood Check blood sugar 2-3 times daily.  Dx Code: E11.9   pantoprazole 40  MG tablet Commonly known as: PROTONIX Take 1 tablet (40 mg total) by mouth 2 (two) times daily before a meal.   Potassium 99 MG Tabs Take 1 tablet by mouth daily.   primidone 50 MG tablet Commonly known as: MYSOLINE 3 in the AM, 2 in the PM        OBJECTIVE:   Vital Signs: BP 140/70   Pulse 60   Ht 6' 2"  (1.88 m)   Wt 217 lb 2 oz (98.5 kg)   SpO2 97%   BMI 27.88 kg/m   Wt Readings from Last 3 Encounters:  09/03/20 217 lb 2 oz (98.5 kg)  07/02/20 219 lb 4 oz (99.5 kg)  05/08/20 219 lb (99.3 kg)     Exam: General: Pt appears well and is in NAD  Lungs: Clear with good BS bilat with no rales, rhonchi, or wheezes  Heart: RRR with normal S1 and S2 and no gallops; no murmurs; no rub  Abdomen: Normoactive bowel sounds, soft, nontender, without masses or organomegaly palpable  Extremities: Trace pretibial edema on the left, no edema on the right   Skin: Normal texture and temperature to palpation.   Neuro: MS is good with appropriate affect, pt is alert and Ox3    DM foot exam: 04/30/2020  The skin of the feet is intact without sores or ulcerations. The pedal pulses are 2+ on right and 2+ on left. The sensation is decreased to a screening 5.07, 10 gram monofilament bilaterally  DATA REVIEWED:  Lab Results  Component Value Date   HGBA1C 8.7 (A)  09/03/2020   HGBA1C 9.7 (H) 04/01/2020   HGBA1C 7.7 09/12/2019   Lab Results  Component Value Date   MICROALBUR 1.72 08/05/2012   LDLCALC 24 04/01/2020   CREATININE 0.66 04/15/2020   Lab Results  Component Value Date   MICRALBCREAT 11.8 08/05/2012     Lab Results  Component Value Date   CHOL 78 04/01/2020   HDL 32.80 (L) 04/01/2020   LDLCALC 24 04/01/2020   LDLDIRECT 145.0 04/26/2018   TRIG 106.0 04/01/2020   CHOLHDL 2 04/01/2020        In-office BG 245 mg/dL pt ate breakfast and did not take his insulin  ASSESSMENT / PLAN / RECOMMENDATIONS:   1) Type 2 Diabetes Mellitus, With improving glucose control, With neuropathic, and macrovascular  complications - Most recent A1c of 8.7 %. Goal A1c < 7.0 %.     - A1c down from 9.7 %  - He is on vials, as he believes this is cheaper, I have asked him to contact his insurance rep and check on pricing , as its safer and more convenient to use pens.  - Based on memory recall his BG's 150-250 mg/dL , will adjust the insulin as below    MEDICATIONS: - Continue Metformin 1000 mg, 1 tablet with Breakfast and Supper - Increase Novolin Mix 70/30  To 18 units before breakfast and Supper   EDUCATION / INSTRUCTIONS:  BG monitoring instructions: Patient is instructed to check his blood sugars 2 times a day, before breakfast and Supper .  Call [facility] [place] [facility] if: BG persistently < 70 . I reviewed the Rule of 15 for the treatment of hypoglycemia in detail with the patient. Literature supplied.     2) Diabetic complications:   Eye: Does not have known diabetic retinopathy.   Neuro/ Feet: Does  have known diabetic peripheral neuropathy .   Renal: Patient does not have known baseline CKD. He  is not on an ACEI/ARB at present.      F/U in 4 months    Signed electronically by: Mack Guise, MD  [facility] Endocrinology  [address] [place], [place] [address] Phone:  [phone] FAX: [phone]   CC: Colon Branch, [address] [place] [address] Phone: [phone]  Fax: [phone]  Return to Endocrinology clinic as below: Future Appointments  Date Time Provider [place]  10/03/2020  9:40 AM Colon Branch, MD [facility]  10/22/2020 11:15 AM [name], [name], DO [facility] None  11/06/2020  9:40 AM Satira Sark, MD [facility]  11/25/2020  9:00 AM [facility]CM PHARMACIST [facility]

## 2020-09-03 ENCOUNTER — Ambulatory Visit (INDEPENDENT_AMBULATORY_CARE_PROVIDER_SITE_OTHER): Payer: PPO | Admitting: Internal Medicine

## 2020-09-03 ENCOUNTER — Telehealth: Payer: Self-pay | Admitting: Pharmacist

## 2020-09-03 ENCOUNTER — Encounter: Payer: Self-pay | Admitting: Internal Medicine

## 2020-09-03 VITALS — BP 140/70 | HR 60 | Ht 74.0 in | Wt 217.1 lb

## 2020-09-03 DIAGNOSIS — E118 Type 2 diabetes mellitus with unspecified complications: Secondary | ICD-10-CM

## 2020-09-03 LAB — POCT GLYCOSYLATED HEMOGLOBIN (HGB A1C): Hemoglobin A1C: 8.7 % — AB (ref 4.0–5.6)

## 2020-09-03 LAB — GLUCOSE, POCT (MANUAL RESULT ENTRY): POC Glucose: 245 mg/dl — AB (ref 70–99)

## 2020-09-03 MED ORDER — NOVOLIN 70/30 (70-30) 100 UNIT/ML ~~LOC~~ SUSP
18.0000 [IU] | Freq: Two times a day (BID) | SUBCUTANEOUS | 3 refills | Status: DC
Start: 2020-09-03 — End: 2020-11-25

## 2020-09-03 NOTE — Progress Notes (Addendum)
Chronic Care Management Pharmacy Assistant   Name: Randy Castro.  MRN: [id number] DOB: July 07, 1951  Reason for Encounter: Medication Review   PCP : Colon Branch, MD  Allergies:   Allergies  Allergen Reactions   Trulicity [Dulaglutide] Nausea And Vomiting    Medications: Outpatient Encounter Medications as of 09/03/2020  Medication Sig Note   aspirin EC 81 MG tablet Take 81 mg by mouth daily.    atorvastatin (LIPITOR) 80 MG tablet Take 1 tablet (80 mg total) by mouth at bedtime.    Blood Glucose Monitoring Suppl (ONE TOUCH ULTRA 2) w/Device KIT by Does not apply route.    carvedilol (COREG) 12.5 MG tablet Take 1 tablet (12.5 mg total) by mouth 2 (two) times daily with a meal.    citalopram (CELEXA) 20 MG tablet Take 1 tablet (20 mg total) by mouth daily.    clotrimazole-betamethasone (LOTRISONE) cream Apply topically 2 (two) times daily as needed.    FEROSUL 325 (65 Fe) MG tablet TAKE 1 TABLET BY MOUTH DAILY    glucose blood (ONETOUCH ULTRA) test strip Check blood sugar 2-3 times daily.  Dx Code: E11.9 (Patient not taking: Reported on 09/03/2020)    insulin NPH-regular Human (NOVOLIN 70/30) (70-30) 100 UNIT/ML injection Inject 18 Units into the skin 2 (two) times daily with a meal.    Insulin Syringes, Disposable, U-100 0.3 ML MISC 1 Device by Does not apply route in the morning and at bedtime.    losartan (COZAAR) 50 MG tablet Take 1 tablet (50 mg total) by mouth daily.    magnesium oxide (MAGNESIUM-OXIDE) 400 (241.3 Mg) MG tablet Take 1 tablet (400 mg total) by mouth 2 (two) times daily.    metFORMIN (GLUCOPHAGE) 1000 MG tablet Take 1 tablet (1,000 mg total) by mouth 2 (two) times daily with a meal.    nitroGLYCERIN (NITROSTAT) 0.4 MG SL tablet Place 1 tablet (0.4 mg total) under the tongue every 5 (five) minutes as needed for chest pain. 05/04/2019: PRN   pantoprazole (PROTONIX) 40 MG tablet Take 1 tablet (40 mg total) by mouth 2 (two) times daily before a meal.    Potassium 99  MG TABS Take 1 tablet by mouth daily.    primidone (MYSOLINE) 50 MG tablet 3 in the AM, 2 in the PM    No facility-administered encounter medications on file as of 09/03/2020.    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   Abdominal pain 11/17/2018   Cardiac arrhythmia, unspecified 11/14/2017   Hypomagnesemia 11/14/2017   Anemia 11/14/2017   Bleeding skin mole 11/14/2017   OSA on CPAP 09/21/2017   Obesity 01/23/2016   PCP NOTES >>> 06/08/2015   Anxiety and depression 05/31/2013   Intermediate coronary syndrome ([place]) 12/22/2012   Unstable angina ([place]) 12/16/2012   Fatigue 04/26/2012   Annual physical exam 10/28/2011   Rash 10/28/2011   Coronary Artery Disease 08/28/2011   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   Palpitations 08/28/2011   NSTEMI (non-ST elevated myocardial infarction) ([place]) 08/07/2011   DJD (degenerative joint disease) 05/28/2011  Poorly controlled type 2 diabetes mellitus with circulatory disorder (HCC)    Hyperlipidemia    HTN (hypertension)     Goals Addressed   None    Reviewed chart for medication changes ahead of medication coordination call.  Consults: 09-03-2020 (Endo) Patient presented in the office for his f/u visit. A1c is down from 9.7% to 8.7, but still not at goal <7.0%. Insulin was increased. Metformin regimen is to be maintained. Was advised to check his BS two times a day, before breakfast and Supper. Dr Kelton Pillar recommended pens because its safer and more convenient.    No OVs or  hospital visits since Pharmacist visit.   Medication changes indicated: Increase Novolin Mix 70/30 to 18 Units into the skin 2 (two) times daily with a meal   BP Readings from Last 3 Encounters:  09/03/20 140/70  07/02/20 126/64  05/08/20 140/64    Lab Results  Component Value Date   HGBA1C 8.7 (A) 09/03/2020     Patient obtains medications through Adherence Packaging  90 Days   Last adherence delivery included:  Atorvastatin 80 mg tab with Breakfast Carvedilol 12.5 mg tab with Breakfast, one tat Bedtime Citalopram 20 mg tab with Breakfast clotrimazole-betamethasone cream (request was sent to PCP for script to be sent to [facility]) Losartan 50 mg tab at Bedtime Magnesium Oxide 400 mg one tab with Breakfast, one at Bedtime Metformin 1000 mg tab with Breakfast, one at Bedtime Pantoprazole 40 mg tab with Breakfast, one at Bedtime Primidone 50 mg tab; three tabs with Breakfast, two tabs at Bedtime  Syringes 3 MI 21g   Patient declined the following medications last month: FEROSUL 325 (65 Fe) MG tablet (wait until next month) One Touch Test Strips Novolin Inj 70/30 (pt will get at [facility] $24 for 30 ds))  Patient is not due for next adherence delivery . Called patient and reviewed medications .  Patient is still taking the following medications: Atorvastatin 80 mg tab with Breakfast Carvedilol 12.5 mg tab with Breakfast, one tat Bedtime Citalopram 20 mg tab with Breakfast Clotrimazole-betamethasone cream  Losartan 50 mg tab at Bedtime Magnesium Oxide 400 mg one tab with Breakfast, one at Bedtime Metformin 1000 mg tab with Breakfast, one at Bedtime Pantoprazole 40 mg tab with Breakfast, one at Bedtime Primidone 50 mg tab; three tabs with Breakfast, two tabs at Bedtime  Syringes 3 MI 21g  Coordinated acute fill for insulin NPH-regular Human (NOVOLIN 70/30) (70-30) 100 UNIT/ML injection pens per Endo recommendation.  Patient does not needs refills.  Confirmed  delivery date of 09-03-2020, advised patient that pharmacy will contact them the morning of- delivery.  Follow-Up:  Pharmacist Review   Fanny [name], [place] Pharmacist Assistant [phone]  5 minutes spent in review, coordination, and documentation.   Reviewed by: De Blanch, PharmD, BCACP Clinical Pharmacist [facility] [phone]

## 2020-09-03 NOTE — Patient Instructions (Addendum)
-   Continue Metformin 1000 mg, 1 tablet with Breakfast and Supper - Increase Novolin Mix 70/30  To 18 units before breakfast and Supper        HOW TO TREAT LOW BLOOD SUGARS (Blood sugar LESS THAN 70 MG/DL)  Please follow the RULE OF 15 for the treatment of hypoglycemia treatment (when your (blood sugars are less than 70 mg/dL)    STEP 1: Take 15 grams of carbohydrates when your blood sugar is low, which includes:   3-4 GLUCOSE TABS  OR  3-4 OZ OF JUICE OR REGULAR SODA OR  ONE TUBE OF GLUCOSE GEL     STEP 2: RECHECK blood sugar in 15 MINUTES STEP 3: If your blood sugar is still low at the 15 minute recheck --> then, go back to STEP 1 and treat AGAIN with another 15 grams of carbohydrates.

## 2020-09-26 ENCOUNTER — Telehealth: Payer: Self-pay | Admitting: Pharmacist

## 2020-09-26 NOTE — Progress Notes (Addendum)
Chronic Care Management Pharmacy Assistant   Name: Randy Castro.  MRN: [id number] DOB: November 13, 1950  Reason for Encounter: Medication Review   PCP : Colon Branch, MD  Allergies:   Allergies  Allergen Reactions   Trulicity [Dulaglutide] Nausea And Vomiting    Medications: Outpatient Encounter Medications as of 09/26/2020  Medication Sig Note   aspirin EC 81 MG tablet Take 81 mg by mouth daily.    atorvastatin (LIPITOR) 80 MG tablet Take 1 tablet (80 mg total) by mouth at bedtime.    Blood Glucose Monitoring Suppl (ONE TOUCH ULTRA 2) w/Device KIT by Does not apply route.    carvedilol (COREG) 12.5 MG tablet Take 1 tablet (12.5 mg total) by mouth 2 (two) times daily with a meal.    citalopram (CELEXA) 20 MG tablet Take 1 tablet (20 mg total) by mouth daily.    clotrimazole-betamethasone (LOTRISONE) cream Apply topically 2 (two) times daily as needed.    FEROSUL 325 (65 Fe) MG tablet TAKE 1 TABLET BY MOUTH DAILY    glucose blood (ONETOUCH ULTRA) test strip Check blood sugar 2-3 times daily.  Dx Code: E11.9 (Patient not taking: Reported on 09/03/2020)    insulin NPH-regular Human (NOVOLIN 70/30) (70-30) 100 UNIT/ML injection Inject 18 Units into the skin 2 (two) times daily with a meal.    Insulin Syringes, Disposable, U-100 0.3 ML MISC 1 Device by Does not apply route in the morning and at bedtime.    losartan (COZAAR) 50 MG tablet Take 1 tablet (50 mg total) by mouth daily.    magnesium oxide (MAGNESIUM-OXIDE) 400 (241.3 Mg) MG tablet Take 1 tablet (400 mg total) by mouth 2 (two) times daily.    metFORMIN (GLUCOPHAGE) 1000 MG tablet Take 1 tablet (1,000 mg total) by mouth 2 (two) times daily with a meal.    nitroGLYCERIN (NITROSTAT) 0.4 MG SL tablet Place 1 tablet (0.4 mg total) under the tongue every 5 (five) minutes as needed for chest pain. 05/04/2019: PRN   pantoprazole (PROTONIX) 40 MG tablet Take 1 tablet (40 mg total) by mouth 2 (two) times daily before a meal.    Potassium 99  MG TABS Take 1 tablet by mouth daily.    primidone (MYSOLINE) 50 MG tablet 3 in the AM, 2 in the PM    No facility-administered encounter medications on file as of 09/26/2020.    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   Abdominal pain 11/17/2018   Cardiac arrhythmia, unspecified 11/14/2017   Hypomagnesemia 11/14/2017   Anemia 11/14/2017   Bleeding skin mole 11/14/2017   OSA on CPAP 09/21/2017   Obesity 01/23/2016   PCP NOTES >>> 06/08/2015   Anxiety and depression 05/31/2013   Intermediate coronary syndrome ([place]) 12/22/2012   Unstable angina ([place]) 12/16/2012   Fatigue 04/26/2012   Annual physical exam 10/28/2011   Rash 10/28/2011   Coronary Artery Disease 08/28/2011   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   Palpitations 08/28/2011   NSTEMI (non-ST elevated myocardial infarction) ([place]) 08/07/2011   DJD (degenerative joint disease) 05/28/2011  Poorly controlled type 2 diabetes mellitus with circulatory disorder (HCC)    Hyperlipidemia    HTN (hypertension)     Goals Addressed   None    Reviewed chart for medication changes ahead of medication coordination call.  No OVs, Consults, or hospital visits since last care coordination call.  No medication changes indicated.  BP Readings from Last 3 Encounters:  09/03/20 140/70  07/02/20 126/64  05/08/20 140/64    Lab Results  Component Value Date   HGBA1C 8.7 (A) 09/03/2020     Patient obtains medications through Adherence Packaging  90 Days   Last adherence  delivery included:  Atorvastatin 80 mg tab with Breakfast Carvedilol 12.5 mg tab with Breakfast, one tat Bedtime Citalopram 20 mg tab with Breakfast clotrimazole-betamethasone cream  Losartan 50 mg tab at Bedtime Magnesium Oxide 400 mg one tab with Breakfast, one at Bedtime Metformin 1000 mg tab with Breakfast, one at Bedtime Pantoprazole 40 mg tab with Breakfast, one at Bedtime Primidone 50 mg tab; three tabs with Breakfast, two tabs at Bedtime  Syringes 3 MI 21g  Coordinated acute fill for insulin NPH-regular Human (NOVOLIN 70/30) (70-30) 100 UNIT/ML injection pens per Endo recommendation on 09-03-2020.   Patient is due for next adherence delivery on: 10-04-2020. Called patient and reviewed medications and coordinated delivery.  This delivery to include: NOVOLIN 70/30 Flex Pen  Patient declined but is still taking the following medications: Atorvastatin 80 mg tab with Breakfast Carvedilol 12.5 mg tab with Breakfast, one tat Bedtime Citalopram 20 mg tab with Breakfast clotrimazole-betamethasone cream  Losartan 50 mg tab at Bedtime Magnesium Oxide 400 mg one tab with Breakfast, one at Bedtime Metformin 1000 mg tab with Breakfast, one at Bedtime Pantoprazole 40 mg tab with Breakfast, one at Bedtime Primidone 50 mg tab; three tabs with Breakfast, two tabs at Bedtime  Syringes 3 MI 21g  Patient does not need refills.  Confirmed delivery date of 10-04-2020, advised patient that pharmacy will contact them the morning of delivery.  Follow-Up:  Coordination of Enhanced Pharmacy Services and Pharmacist Review   Fanny [name], [place] Pharmacist Assistant [phone]  5 minutes spent in review, coordination, and documentation.  Reviewed by: Beverly Milch, PharmD Clinical Pharmacist [facility] [phone]

## 2020-10-03 ENCOUNTER — Ambulatory Visit (INDEPENDENT_AMBULATORY_CARE_PROVIDER_SITE_OTHER): Payer: PPO | Admitting: Internal Medicine

## 2020-10-03 ENCOUNTER — Other Ambulatory Visit: Payer: Self-pay

## 2020-10-03 ENCOUNTER — Encounter: Payer: Self-pay | Admitting: Internal Medicine

## 2020-10-03 VITALS — BP 162/80 | HR 60 | Temp 97.6°F | Ht 74.0 in | Wt 221.0 lb

## 2020-10-03 DIAGNOSIS — Z0000 Encounter for general adult medical examination without abnormal findings: Secondary | ICD-10-CM

## 2020-10-03 DIAGNOSIS — L989 Disorder of the skin and subcutaneous tissue, unspecified: Secondary | ICD-10-CM | POA: Diagnosis not present

## 2020-10-03 LAB — COMPREHENSIVE METABOLIC PANEL
ALT: 47 U/L (ref 0–53)
AST: 37 U/L (ref 0–37)
Albumin: 4 g/dL (ref 3.5–5.2)
Alkaline Phosphatase: 69 U/L (ref 39–117)
BUN: 10 mg/dL (ref 6–23)
CO2: 32 mEq/L (ref 19–32)
Calcium: 8.9 mg/dL (ref 8.4–10.5)
Chloride: 100 mEq/L (ref 96–112)
Creatinine, Ser: 0.71 mg/dL (ref 0.40–1.50)
GFR: 93.88 mL/min (ref >60.00)
Glucose, Bld: 274 mg/dL — ABNORMAL HIGH (ref 70–99)
Potassium: 4.3 mEq/L (ref 3.5–5.1)
Sodium: 140 mEq/L (ref 135–145)
Total Bilirubin: 0.5 mg/dL (ref 0.2–1.2)
Total Protein: 6.1 g/dL (ref 6.0–8.3)

## 2020-10-03 LAB — CBC WITH DIFFERENTIAL/PLATELET
Basophils Absolute: 0.1 10*3/uL (ref 0.0–0.1)
Basophils Relative: 1.3 % (ref 0.0–3.0)
Eosinophils Absolute: 0.3 10*3/uL (ref 0.0–0.7)
Eosinophils Relative: 5.4 % — ABNORMAL HIGH (ref 0.0–5.0)
HCT: 37.9 % — ABNORMAL LOW (ref 39.0–52.0)
Hemoglobin: 12.6 g/dL — ABNORMAL LOW (ref 13.0–17.0)
Lymphocytes Relative: 23.3 % (ref 12.0–46.0)
Lymphs Abs: 1.4 10*3/uL (ref 0.7–4.0)
MCHC: 33.4 g/dL (ref 30.0–36.0)
MCV: 86.7 fl (ref 78.0–100.0)
Monocytes Absolute: 0.5 10*3/uL (ref 0.1–1.0)
Monocytes Relative: 8 % (ref 3.0–12.0)
Neutro Abs: 3.7 10*3/uL (ref 1.4–7.7)
Neutrophils Relative %: 62 % (ref 43.0–77.0)
Platelets: 226 10*3/uL (ref 150.0–400.0)
RBC: 4.37 Mil/uL (ref 4.22–5.81)
RDW: 14.5 % (ref 11.5–15.5)
WBC: 6 10*3/uL (ref 4.0–10.5)

## 2020-10-03 NOTE — Assessment & Plan Note (Signed)
--  Td 2014 -PNM 23: 2012, 2020;  prevnar 2016 - zostavax 2014 -shingrex discussed again today, he is considering getting at the [facility] x3 -Had a flu shot --CCS:  Cologuard NEG 01/24/17, C-scope 10/23/19 (done in [place]) , 12/26/19 Dr Bryan Lemma, next per GI -- Prostate ca screening: 04/2019, declined DRE, PSA was 0.5 --Patient education: Diet, exercise and advance directives discussed. --Labs: CMP, CBC

## 2020-10-03 NOTE — Progress Notes (Signed)
Subjective:    Patient ID: Randy Castro., male    DOB: Apr 04, 1951, 70 y.o.   MRN: [id number]  DOS:  10/03/2020 Type of visit - description: CPX Feels very well except for dizziness is a on and off issue, a couple of times was severe and caused 2 falls .  BP Readings from Last 3 Encounters:  10/03/20 (!) 162/80  09/03/20 140/70  07/02/20 126/64     Review of Systems Specifically denies chest pain, palpitations, headache, slurred speech, focal deficits.  Other than above, a 14 point review of systems is negative      Past Medical History:  Diagnosis Date  . Anxiety   . Arthritis   . CAD (coronary artery disease)    a. NSTEMI 12/12: EF 40-45%. NTI:RWERXVQ balloon PTCA + Promus DES x 1 to mid LAD;  b. 11/2012: Cath with Cutting balloon PTCA  LAD for ISR to LAD, EF 55%;  c. 12/2012 Cath/PCI: LAD stents patent, 80 ost Diag (jailed)->PTCA, LCX/RCA patent.  . Essential hypertension   . GI bleed   . History of blood transfusion    was getting 4 units  . Hyperlipidemia   . Ischemic cardiomyopathy    a.  echo 08/06/11: dist ant wall, apical and septal and infero-apical HK, mild LVH, EF 40%, mild LAE, PASP 34, asc aorta mildly dilated, mild TR;  b.  Echo (3/13) showed recovery of LV systolic function with EF 00-86%, grade I diastolic dysfunction, mild MR.   . Myocardial infarction ([place])    twice with NSTEMI 2012  . Palpitations   . Type 2 diabetes mellitus ([place])     Past Surgical History:  Procedure Laterality Date  . ANTERIOR CERVICAL DECOMP/DISCECTOMY FUSION  in the 90s  . CHOLECYSTECTOMY  03/27/2019  . COLONOSCOPY  2021  . COLONOSCOPY WITH ESOPHAGOGASTRODUODENOSCOPY (EGD)  10/2019  . CORONARY ANGIOPLASTY  12/15/2012; 12/22/2012  . CORONARY ANGIOPLASTY WITH STENT PLACEMENT  07/2011   "1" (12/22/2012)  . GASTRIC BYPASS  12/21/2017  . KNEE ARTHROSCOPY Left 12/31/2016  . LEFT HEART CATHETERIZATION WITH CORONARY ANGIOGRAM N/A 08/06/2011   Procedure: LEFT HEART CATHETERIZATION  WITH CORONARY ANGIOGRAM;  Surgeon: Burnell Blanks, MD;  Location: [facility] CATH LAB;  Service: Cardiovascular;  Laterality: N/A;  . LEFT HEART CATHETERIZATION WITH CORONARY ANGIOGRAM N/A 12/15/2012   Procedure: LEFT HEART CATHETERIZATION WITH CORONARY ANGIOGRAM;  Surgeon: Sherren Mocha, MD;  Location: [facility] CATH LAB;  Service: Cardiovascular;  Laterality: N/A;  . LEFT HEART CATHETERIZATION WITH CORONARY ANGIOGRAM N/A 12/22/2012   Procedure: LEFT HEART CATHETERIZATION WITH CORONARY ANGIOGRAM;  Surgeon: Sherren Mocha, MD;  Location: [facility] CATH LAB;  Service: Cardiovascular;  Laterality: N/A;  . PERCUTANEOUS CORONARY STENT INTERVENTION (PCI-S) Right 12/15/2012   Procedure: PERCUTANEOUS CORONARY STENT INTERVENTION (PCI-S);  Surgeon: Sherren Mocha, MD;  Location: [facility] CATH LAB;  Service: Cardiovascular;  Laterality: Right;    Allergies as of 10/03/2020      Reactions   Trulicity [dulaglutide] Nausea And Vomiting      Medication List       Accurate as of October 03, 2020  9:23 PM. If you have any questions, ask your nurse or doctor.        aspirin EC 81 MG tablet Take 81 mg by mouth daily.   atorvastatin 80 MG tablet Commonly known as: LIPITOR Take 1 tablet (80 mg total) by mouth at bedtime.   carvedilol 12.5 MG tablet Commonly known as: COREG Take 1 tablet (12.5 mg total) by mouth 2 (  two) times daily with a meal.   citalopram 20 MG tablet Commonly known as: CELEXA Take 1 tablet (20 mg total) by mouth daily.   clotrimazole-betamethasone cream Commonly known as: LOTRISONE Apply topically 2 (two) times daily as needed.   FeroSul 325 (65 FE) MG tablet Generic drug: ferrous sulfate TAKE 1 TABLET BY MOUTH DAILY   Insulin Syringes (Disposable) U-100 0.3 ML Misc 1 Device by Does not apply route in the morning and at bedtime.   losartan 50 MG tablet Commonly known as: COZAAR Take 1 tablet (50 mg total) by mouth daily.   magnesium oxide 400 (241.3 Mg) MG tablet Commonly known as:  MAGnesium-Oxide Take 1 tablet (400 mg total) by mouth 2 (two) times daily.   metFORMIN 1000 MG tablet Commonly known as: GLUCOPHAGE Take 1 tablet (1,000 mg total) by mouth 2 (two) times daily with a meal.   nitroGLYCERIN 0.4 MG SL tablet Commonly known as: NITROSTAT Place 1 tablet (0.4 mg total) under the tongue every 5 (five) minutes as needed for chest pain.   NovoLIN 70/30 (70-30) 100 UNIT/ML injection Generic drug: insulin NPH-regular Human Inject 18 Units into the skin 2 (two) times daily with a meal.   ONE TOUCH ULTRA 2 w/Device Kit by Does not apply route.   OneTouch Ultra test strip Generic drug: glucose blood Check blood sugar 2-3 times daily.  Dx Code: E11.9   pantoprazole 40 MG tablet Commonly known as: PROTONIX Take 1 tablet (40 mg total) by mouth 2 (two) times daily before a meal.   Potassium 99 MG Tabs Take 1 tablet by mouth daily.   primidone 50 MG tablet Commonly known as: MYSOLINE 3 in the AM, 2 in the PM          Objective:   Physical Exam HENT:     Head:     BP (!) 162/80 (BP Location: Left Arm, Patient Position: Sitting, Cuff Size: Large)   Pulse 60   Temp 97.6 F (36.4 C) (Oral)   Ht 6' 2" (1.88 m)   Wt 221 lb (100.2 kg)   SpO2 97%   BMI 28.37 kg/m  General: Well developed, NAD, BMI noted Neck: Normal carotid pulses HEENT:  Normocephalic . Face symmetric, atraumatic Lungs:  CTA B Normal respiratory effort, no intercostal retractions, no accessory muscle use. Heart: RRR,  no murmur.  Abdomen:  Not distended, soft, non-tender. No rebound or rigidity.  No bruit Lower extremities: no pretibial edema bilaterally  Skin: See graphic Neurologic:  alert & oriented X3.  Speech normal, gait appropriate for age and unassisted Strength symmetric and appropriate for age.  Psych: Cognition and judgment appear intact.  Cooperative with normal attention span and concentration.  Behavior appropriate. No anxious or depressed appearing.      Assessment     Assessment   DM : insulin dependent, uncontrolled, w/ CAD CHF. Intol Trulicity, used to see [facility] until 08/2019 HTN Hyperlipidemia CAD, CHF, ischemic cardiomyopathy, PVCs (Holter 2019 show 13.6% burden) NSTEMI in 12/12 (DES to LAD), EF 40%, unstable angina in 4/14 and 5/14 . Repeat echo in 3/13 showed EF 55-60%. 4/14 >> unstable angina and had 90% pLAD in-stent restenosis (Rxcutting balloon angioplasty). Readmitted, again with unstable angina, in 5/14. This time he had PTCA to 90% ostial D1 stenosis.  OSA dx ~08-2017, om Cpap Anxiety-depression Essential Tremor dx 2019  Skin cancer: Right leg, status post excision 2016; Dr Allyson Sabal Morbid obesity:gastric sleeve, 11-2017 Dr Raul Del 09-2019 GI bleed, gastric ulcer, required transfusion  PLAN: Here for CPX DM: Per Endo HTN: BP today slightly elevated, typically okay, rec to check ambulatory BPs.  Continue carvedilol, losartan, magnesium, potassium, checking labs Hyperlipidemia: Controlled on Lipitor. Anxiety depression: Not an issue Dizziness: Ongoing problem, had 2 falls, see last visit, he already has an appointment to see neurology in few weeks.  Until then strongly recommend caution to prevent falls. Skin lesion: Reports skin lesion on the face, has been growing lately, probably SK, refer to dermatology RTC 6 months   This visit occurred during the SARS-CoV-2 public health emergency.  Safety protocols were in place, including screening questions prior to the visit, additional usage of staff PPE, and extensive cleaning of exam room while observing appropriate contact time as indicated for disinfecting solutions.

## 2020-10-03 NOTE — Patient Instructions (Addendum)
Per our records you are due for an eye exam. Please contact your eye doctor to schedule an appointment. Please have them send copies of your office visit notes to [place]. Our fax number is [phone].  Check the  blood pressurevweekly  BP GOAL is between 110/65 and  135/85. If it is consistently higher or lower, let me know   GO TO THE LAB : Get the blood work    We are referring you to a dermatologist, if you do not hear from them in 10 days please let us know  [place], [place] back for a checkup in 6 months  Advance Directive  Advance directives are legal documents that allow you to make decisions about your health care and medical treatment in case you become unable to communicate for yourself. Advance directives let your wishes be known to family, friends, and health care providers. Discussing and writing advance directives should happen over time rather than all at once. Advance directives can be changed and updated at any time. There are different types of advance directives, such as:  Medical power of attorney.  Living will.  Do not resuscitate (DNR) order or do not attempt resuscitation (DNAR) order. Health care proxy and medical power of attorney A health care proxy is also called a health care agent. This person is appointed to make medical decisions for you when you are unable to make decisions for yourself. Generally, people ask a trusted friend or family member to act as their proxy and represent their preferences. Make sure you have an agreement with your trusted person to act as your proxy. A proxy may have to make a medical decision on your behalf if your wishes are not known. A medical power of attorney, also called a durable power of attorney for health care, is a legal document that names your health care proxy. Depending on the laws in your state, the document may need to  be:  Signed.  Notarized.  Dated.  Copied.  Witnessed.  Incorporated into your medical record. You may also want to appoint a trusted person to manage your money in the event you are unable to do so. This is called a durable power of attorney for finances. It is a separate legal document from the durable power of attorney for health care. You may choose your health care proxy or someone different to act as your agent in money matters. If you do not appoint a proxy, or there is a concern that the proxy is not acting in your best interest, a court may appoint a guardian to act on your behalf. Living will A living will is a set of instructions that state your wishes about medical care when you cannot express them yourself. Health care providers should keep a copy of your living will in your medical record. You may want to give a copy to family members or friends. To alert caregivers in case of an emergency, you can place a card in your wallet to let them know that you have a living will and where they can find it. A living will is used if you become:  Terminally ill.  Disabled.  Unable to communicate or make decisions. The following decisions should be included in your living will:  To use or not to use life support equipment, such as dialysis machines and breathing machines (ventilators).  Whether you want a DNR or DNAR order. This tells health care providers  not to use cardiopulmonary resuscitation (CPR) if breathing or heartbeat stops.  To use or not to use tube feeding.  To be given or not to be given food and fluids.  Whether you want comfort (palliative) care when the goal becomes comfort rather than a cure.  Whether you want to donate your organs and tissues. A living will does not give instructions for distributing your money and property if you should pass away. DNR or DNAR A DNR or DNAR order is a request not to have CPR in the event that your heart stops beating or you  stop breathing. If a DNR or DNAR order has not been made and shared, a health care provider will try to help any patient whose heart has stopped or who has stopped breathing. If you plan to have surgery, talk with your health care provider about how your DNR or DNAR order will be followed if problems occur. What if I do not have an advance directive? Some states assign family decision makers to act on your behalf if you do not have an advance directive. Each state has its own laws about advance directives. You may want to check with your health care provider, attorney, or state representative about the laws in your state. Summary  Advance directives are legal documents that allow you to make decisions about your health care and medical treatment in case you become unable to communicate for yourself.  The process of discussing and writing advance directives should happen over time. You can change and update advance directives at any time.  Advance directives may include a medical power of attorney, a living will, and a DNR or DNAR order. This information is not intended to replace advice given to you by your health care provider. Make sure you discuss any questions you have with your health care provider. Document Revised: 05/14/2020 Document Reviewed: 05/14/2020 Elsevier Patient Education  2021 [employer].

## 2020-10-03 NOTE — Assessment & Plan Note (Signed)
Here for CPX DM: Per Endo HTN: BP today slightly elevated, typically okay, rec to check ambulatory BPs.  Continue carvedilol, losartan, magnesium, potassium, checking labs Hyperlipidemia: Controlled on Lipitor. Anxiety depression: Not an issue Dizziness: Ongoing problem, had 2 falls, see last visit, he already has an appointment to see neurology in few weeks.  Until then strongly recommend caution to prevent falls. Skin lesion: Reports skin lesion on the face, has been growing lately, probably SK, refer to dermatology RTC 6 months

## 2020-10-18 NOTE — Progress Notes (Signed)
Assessment/Plan:    1.  Essential Tremor  Continue primidone, 50 mg, 3 tablets in the morning, 2 in the evening  2.  Gait instability, suspect due to peripheral neuropathy, likely diabetic   -The patient has clinical examination evidence of a diffuse peripheral neuropathy, which certainly can affect gait and balance.  We discussed safety associated with peripheral neuropathy.  We discussed balance therapy and the importance of ambulatory assistive device for balance assistance.  He wants to hold off on balance therapy at least until he gets the EMG back.  -We will do EMG.  -We discussed the importance of blood sugar control.  He is trying to work on that.  States that he is doing better than he was.  -We will do B12, RPR, SPEP/UPEP with immunofixation.  Other lab work has been completed.   Subjective:   Randy Castro. was seen today in follow up for essential tremor.  My previous records were reviewed prior to todays visit. Pt with wife who supplements hx.  States that his tremor is well controlled.  Working with wood work and doing well.  Pt with issues with balance.  He denies dizziness (although its reported in PCP records).  No vertigo.  He fell off ladder.  Some occ paresthesias of the feet.  States that diabetes is under better control.   No hallucinations.  Mood has been good.  Endocrinology records reviewed.  A1c was elevated.  Current prescribed movement disorder medications: Primidone, 50 mg, 3 in the morning, 2 at night   ALLERGIES:   Allergies  Allergen Reactions  . Trulicity [Dulaglutide] Nausea And Vomiting    CURRENT MEDICATIONS:  Outpatient Encounter Medications as of 10/22/2020  Medication Sig  . aspirin EC 81 MG tablet Take 81 mg by mouth daily.  Marland Kitchen atorvastatin (LIPITOR) 80 MG tablet Take 1 tablet (80 mg total) by mouth at bedtime.  . Blood Glucose Monitoring Suppl (ONE TOUCH ULTRA 2) w/Device KIT by Does not apply route.  . carvedilol (COREG) 12.5 MG tablet  Take 1 tablet (12.5 mg total) by mouth 2 (two) times daily with a meal.  . citalopram (CELEXA) 20 MG tablet Take 1 tablet (20 mg total) by mouth daily.  . clotrimazole-betamethasone (LOTRISONE) cream Apply topically 2 (two) times daily as needed.  . FEROSUL 325 (65 Fe) MG tablet TAKE 1 TABLET BY MOUTH DAILY  . glucose blood (ONETOUCH ULTRA) test strip Check blood sugar 2-3 times daily.  Dx Code: E11.9 (Patient taking differently: Check blood sugar 2-3 times daily.  Dx Code: E11.9)  . insulin NPH-regular Human (NOVOLIN 70/30) (70-30) 100 UNIT/ML injection Inject 18 Units into the skin 2 (two) times daily with a meal.  . Insulin Syringes, Disposable, U-100 0.3 ML MISC 1 Device by Does not apply route in the morning and at bedtime.  Marland Kitchen losartan (COZAAR) 50 MG tablet Take 1 tablet (50 mg total) by mouth daily.  . magnesium oxide (MAGNESIUM-OXIDE) 400 (241.3 Mg) MG tablet Take 1 tablet (400 mg total) by mouth 2 (two) times daily.  . metFORMIN (GLUCOPHAGE) 1000 MG tablet Take 1 tablet (1,000 mg total) by mouth 2 (two) times daily with a meal.  . nitroGLYCERIN (NITROSTAT) 0.4 MG SL tablet Place 1 tablet (0.4 mg total) under the tongue every 5 (five) minutes as needed for chest pain.  . pantoprazole (PROTONIX) 40 MG tablet Take 1 tablet (40 mg total) by mouth 2 (two) times daily before a meal.  . Potassium 99 MG  TABS Take 1 tablet by mouth daily.  . primidone (MYSOLINE) 50 MG tablet 3 in the AM, 2 in the PM   No facility-administered encounter medications on file as of 10/22/2020.     Objective:    PHYSICAL EXAMINATION:    VITALS:   Vitals:   10/22/20 1116  BP: (!) 150/74  Pulse: 68  SpO2: 98%  Weight: 219 lb (99.3 kg)  Height: 6' 2"  (1.88 m)    GEN:  The patient appears stated age and is in NAD. HEENT:  Normocephalic, atraumatic.  The mucous membranes are moist. The superficial temporal arteries are without ropiness or tenderness. CV:  RRR Lungs:  CTAB Neck/HEME:  There are no carotid  bruits bilaterally.  Neurological examination:  Orientation: The patient is alert and oriented x3. Cranial nerves: There is good facial symmetry. The speech is fluent and clear. Soft palate rises symmetrically and there is no tongue deviation. Hearing is intact to conversational tone. Sensation: Sensation is intact to light touch throughout.  There is absence of vibratory sensation in the big toe and decreased in the bilateral ankle and knee. Motor: Strength is at least antigravity x4.  Movement examination: Tone: There is normal tone in the UE/LE Abnormal movements: no rest tremor.  No postural or intention tremor today Coordination:  There is no decremation with RAM's,  Gait and Station: The patient has no difficulty arising out of a deep-seated chair without the use of the hands. The patient's gait is antalgic (bad knee per pt).  He is unable to ambulate in a tandem fashion. I have reviewed and interpreted the following labs independently   Lab Results  Component Value Date   TSH 0.92 04/26/2018     Chemistry      Component Value Date/Time   NA 140 10/03/2020 1020   K 4.3 10/03/2020 1020   CL 100 10/03/2020 1020   CO2 32 10/03/2020 1020   BUN 10 10/03/2020 1020   CREATININE 0.71 10/03/2020 1020   CREATININE 1.20 06/15/2016 0849      Component Value Date/Time   CALCIUM 8.9 10/03/2020 1020   ALKPHOS 69 10/03/2020 1020   AST 37 10/03/2020 1020   ALT 47 10/03/2020 1020   BILITOT 0.5 10/03/2020 1020       Lab Results  Component Value Date   HGBA1C 8.7 (A) 09/03/2020    No results found for: VITAMINB12    Total time spent on today's visit was 30 minutes, including both face-to-face time and nonface-to-face time.  Time included that spent on review of records (prior notes available to me/labs/imaging if pertinent), discussing treatment and goals, answering patient's questions and coordinating care.  Cc:  Colon Branch, MD

## 2020-10-22 ENCOUNTER — Other Ambulatory Visit: Payer: Self-pay

## 2020-10-22 ENCOUNTER — Encounter: Payer: Self-pay | Admitting: Neurology

## 2020-10-22 ENCOUNTER — Ambulatory Visit: Payer: PPO | Admitting: Neurology

## 2020-10-22 ENCOUNTER — Other Ambulatory Visit (INDEPENDENT_AMBULATORY_CARE_PROVIDER_SITE_OTHER): Payer: PPO

## 2020-10-22 VITALS — BP 150/74 | HR 68 | Ht 74.0 in | Wt 219.0 lb

## 2020-10-22 DIAGNOSIS — D509 Iron deficiency anemia, unspecified: Secondary | ICD-10-CM

## 2020-10-22 DIAGNOSIS — G609 Hereditary and idiopathic neuropathy, unspecified: Secondary | ICD-10-CM

## 2020-10-22 DIAGNOSIS — G250 Essential tremor: Secondary | ICD-10-CM

## 2020-10-22 LAB — VITAMIN B12: Vitamin B-12: 257 pg/mL (ref 211–911)

## 2020-10-22 NOTE — Patient Instructions (Addendum)
Your physician recommends that you schedule a follow-up appointment in: 6 months with Dr [name].  Your provider has requested that you have labwork completed today. Please go to [facility] ([id number]) on the second floor of this building before leaving the office today. You do not need to check in. If you are not called within 15 minutes please check with the front desk.

## 2020-10-24 LAB — PROTEIN ELECTROPHORESIS, URINE REFLEX
Albumin ELP, Urine: 74.1 %
Alpha-1-Globulin, U: 1.4 %
Alpha-2-Globulin, U: 4.5 %
Beta Globulin, U: 14.2 %
Gamma Globulin, U: 5.8 %
Protein, Ur: 111.1 mg/dL

## 2020-10-24 LAB — PROTEIN ELECTROPHORESIS, SERUM
Albumin ELP: 3.6 g/dL — ABNORMAL LOW (ref 3.8–4.8)
Alpha 1: 0.3 g/dL (ref 0.2–0.3)
Alpha 2: 0.7 g/dL (ref 0.5–0.9)
Beta 2: 0.3 g/dL (ref 0.2–0.5)
Beta Globulin: 0.4 g/dL (ref 0.4–0.6)
Gamma Globulin: 0.5 g/dL — ABNORMAL LOW (ref 0.8–1.7)
Total Protein: 5.9 g/dL — ABNORMAL LOW (ref 6.1–8.1)

## 2020-10-24 LAB — SPECIMEN STATUS REPORT

## 2020-10-24 LAB — RPR: RPR Ser Ql: NONREACTIVE

## 2020-10-24 LAB — IMMUNOFIXATION, SERUM
IgA/Immunoglobulin A, Serum: 249 mg/dL (ref 61–437)
IgG (Immunoglobin G), Serum: 578 mg/dL — ABNORMAL LOW (ref 603–1613)
IgM (Immunoglobulin M), Srm: 32 mg/dL (ref 20–172)

## 2020-10-27 ENCOUNTER — Other Ambulatory Visit: Payer: Self-pay | Admitting: Internal Medicine

## 2020-10-27 ENCOUNTER — Other Ambulatory Visit: Payer: Self-pay | Admitting: Neurology

## 2020-11-01 ENCOUNTER — Telehealth: Payer: Self-pay | Admitting: Pharmacist

## 2020-11-01 NOTE — Progress Notes (Addendum)
Chronic Care Management Pharmacy Assistant   Name: Randy Castro.  MRN: [id number] DOB: Oct 29, 1950  Reason for Encounter: Medication Review   Medications: Outpatient Encounter Medications as of 11/01/2020  Medication Sig Note   atorvastatin (LIPITOR) 80 MG tablet Take 1 tablet (80 mg total) by mouth at bedtime.    carvedilol (COREG) 12.5 MG tablet Take 1 tablet (12.5 mg total) by mouth 2 (two) times daily with a meal.    losartan (COZAAR) 50 MG tablet Take 1 tablet (50 mg total) by mouth at bedtime.    metFORMIN (GLUCOPHAGE) 1000 MG tablet Take 1 tablet (1,000 mg total) by mouth 2 (two) times daily with a meal.    aspirin EC 81 MG tablet Take 81 mg by mouth daily.    Blood Glucose Monitoring Suppl (ONE TOUCH ULTRA 2) w/Device KIT by Does not apply route.    citalopram (CELEXA) 20 MG tablet Take 1 tablet (20 mg total) by mouth daily.    clotrimazole-betamethasone (LOTRISONE) cream Apply topically 2 (two) times daily as needed.    FEROSUL 325 (65 Fe) MG tablet TAKE 1 TABLET BY MOUTH DAILY    glucose blood (ONETOUCH ULTRA) test strip Check blood sugar 2-3 times daily.  Dx Code: E11.9 (Patient taking differently: Check blood sugar 2-3 times daily.  Dx Code: E11.9)    insulin NPH-regular Human (NOVOLIN 70/30) (70-30) 100 UNIT/ML injection Inject 18 Units into the skin 2 (two) times daily with a meal.    Insulin Syringes, Disposable, U-100 0.3 ML MISC 1 Device by Does not apply route in the morning and at bedtime.    magnesium oxide (MAGNESIUM-OXIDE) 400 (241.3 Mg) MG tablet Take 1 tablet (400 mg total) by mouth 2 (two) times daily.    nitroGLYCERIN (NITROSTAT) 0.4 MG SL tablet Place 1 tablet (0.4 mg total) under the tongue every 5 (five) minutes as needed for chest pain. 05/04/2019: PRN   pantoprazole (PROTONIX) 40 MG tablet Take 1 tablet (40 mg total) by mouth 2 (two) times daily before a meal.    Potassium 99 MG TABS Take 1 tablet by mouth daily.    primidone (MYSOLINE) 50 MG tablet TAKE  THREE TABLETS BY MOUTH EVERY MORNING and TAKE TWO TABLETS BY MOUTH EVERY EVENING    No facility-administered encounter medications on file as of 11/01/2020.   Reviewed chart for medication changes ahead of medication coordination call.  No hospital visits since last care coordination call.  Office Visits: 10/03/20 Dr. Larose Castro For Annual physical exam. Labs drawn.Per note: check blood pressure vweekly. No medication changes.  Consults: 10/22/20 Neurology Randy Castro, Randy Quail, DO. Iron deficiency. Labs drawn. No medication changes.  No medication changes indicated.  BP Readings from Last 3 Encounters:  10/22/20 (!) 150/74  10/03/20 (!) 162/80  09/03/20 140/70    Lab Results  Component Value Date   HGBA1C 8.7 (A) 09/03/2020     Patient obtains medications through Adherence Packaging  90 Days   Last adherence delivery included: NOVOLIN 70/30 Flex Pen  Patient declined meds last month:(90 DS on 08/09/20) Atorvastatin 80 mg tab with Breakfast Carvedilol 12.5 mg tab with Breakfast, one Randy Castro Bedtime Citalopram 20 mg tab with Breakfast clotrimazole-betamethasone cream  Losartan 50 mg tab at Bedtime Magnesium Oxide 400 mg one tab with Breakfast, one at Bedtime Metformin 1000 mg tab with Breakfast, one at Bedtime Pantoprazole 40 mg tab with Breakfast, one at Bedtime Primidone 50 mg tab; three tabs with Breakfast, two tabs at Bedtime  Syringes  3 MI 21g  Patient is due for next adherence delivery on: 11/13/20  Called patient and reviewed medications and coordinated delivery.  This delivery to include: Atorvastatin 80 mg tab with Breakfast Carvedilol 12.5 mg tab with Breakfast, one Randy Castro Bedtime Citalopram 20 mg tab with Breakfast clotrimazole-betamethasone cream  Losartan 50 mg tab at Bedtime Magnesium Oxide 400 mg one tab with Breakfast, one at Bedtime Metformin 1000 mg tab with Breakfast, one at Bedtime Pantoprazole 40 mg tab with Breakfast, one at Bedtime Primidone 50 mg tab; three tabs  with Breakfast, two tabs at Bedtime   Patient declined the following medications: NOVOLIN 70/30 Flex Pen Syringes 3 MI 21g  Patient does not need any refills at this time.  Confirmed delivery date of 11/13/20, advised patient that pharmacy will contact them the morning of delivery. Patient wants it sent on this day because we have been sending his medications earlier and earlier and this is when he gets paid.    Follow-Up: Pharmacist Review  Charlann Lange, RMA Clinical Pharmacist Assistant [phone]  10 minutes spent in review, coordination, and documentation.  Reviewed by: Beverly Milch, PharmD Clinical Pharmacist [facility] [phone]

## 2020-11-05 NOTE — Progress Notes (Signed)
Cardiology Office Note  Date: 11/06/2020   ID: Randy Castro., DOB 06-07-1951, MRN [id number]  PCP:  Colon Branch, MD  Cardiologist:  Rozann Lesches, MD Electrophysiologist:  None   Chief Complaint  Patient presents with  . Cardiac follow-up    History of Present Illness: Randy Castro. is a 70 y.o. male last seen in September 2021.  He is here for a routine visit, states that he has been doing well without any active angina symptoms.  Has been busy around the house with several activities, redoing the bedroom, also plan to redo the kitchen and build a deck.  His wife is going to be retiring in the next year.  He enjoys woodworking.  I reviewed his medications which are stable from a cardiac perspective and outlined below.  He is tolerating high-dose Lipitor, last LDL of 24.  I personally reviewed his ECG today which shows sinus rhythm with LVH and IVCD which is stable.  He does not describe any palpitations or syncope.  Past Medical History:  Diagnosis Date  . Anxiety   . Arthritis   . CAD (coronary artery disease)    a. NSTEMI 12/12: EF 40-45%. MOQ:HUTMLYY balloon PTCA + Promus DES x 1 to mid LAD;  b. 11/2012: Cath with Cutting balloon PTCA  LAD for ISR to LAD, EF 55%;  c. 12/2012 Cath/PCI: LAD stents patent, 80 ost Diag (jailed)->PTCA, LCX/RCA patent.  . Essential hypertension   . GI bleed   . History of blood transfusion    was getting 4 units  . Hyperlipidemia   . Ischemic cardiomyopathy    a.  echo 08/06/11: dist ant wall, apical and septal and infero-apical HK, mild LVH, EF 40%, mild LAE, PASP 34, asc aorta mildly dilated, mild TR;  b.  Echo (3/13) showed recovery of LV systolic function with EF 50-35%, grade I diastolic dysfunction, mild MR.   . Myocardial infarction ([place])    twice with NSTEMI 2012  . Palpitations   . Type 2 diabetes mellitus ([place])     Past Surgical History:  Procedure Laterality Date  . ANTERIOR CERVICAL DECOMP/DISCECTOMY FUSION  in the 90s   . CHOLECYSTECTOMY  03/27/2019  . COLONOSCOPY  2021  . COLONOSCOPY WITH ESOPHAGOGASTRODUODENOSCOPY (EGD)  10/2019  . CORONARY ANGIOPLASTY  12/15/2012; 12/22/2012  . CORONARY ANGIOPLASTY WITH STENT PLACEMENT  07/2011   "1" (12/22/2012)  . GASTRIC BYPASS  12/21/2017  . KNEE ARTHROSCOPY Left 12/31/2016  . LEFT HEART CATHETERIZATION WITH CORONARY ANGIOGRAM N/A 08/06/2011   Procedure: LEFT HEART CATHETERIZATION WITH CORONARY ANGIOGRAM;  Surgeon: Burnell Blanks, MD;  Location[facility] CATH LAB;  Service: Cardiovascular;  Laterality: N/A;  . LEFT HEART CATHETERIZATION WITH CORONARY ANGIOGRAM N/A 12/15/2012   Procedure: LEFT HEART CATHETERIZATION WITH CORONARY ANGIOGRAM;  Surgeon: Sherren Mocha, MD;  Location: [facility] CATH LAB;  Service: Cardiovascular;  Laterality: N/A;  . LEFT HEART CATHETERIZATION WITH CORONARY ANGIOGRAM N/A 12/22/2012   Procedure: LEFT HEART CATHETERIZATION WITH CORONARY ANGIOGRAM;  Surgeon: Sherren Mocha, MD;  Location: [facility] CATH LAB;  Service: Cardiovascular;  Laterality: N/A;  . PERCUTANEOUS CORONARY STENT INTERVENTION (PCI-S) Right 12/15/2012   Procedure: PERCUTANEOUS CORONARY STENT INTERVENTION (PCI-S);  Surgeon: Sherren Mocha, MD;  Location: [facility] CATH LAB;  Service: Cardiovascular;  Laterality: Right;    Current Outpatient Medications  Medication Sig Dispense Refill  . aspirin EC 81 MG tablet Take 81 mg by mouth daily.    Marland Kitchen atorvastatin (LIPITOR) 80 MG tablet Take 1 tablet (  80 mg total) by mouth at bedtime. 90 tablet 1  . Blood Glucose Monitoring Suppl (ONE TOUCH ULTRA 2) w/Device KIT by Does not apply route.    . carvedilol (COREG) 12.5 MG tablet Take 1 tablet (12.5 mg total) by mouth 2 (two) times daily with a meal. 180 tablet 1  . citalopram (CELEXA) 20 MG tablet Take 1 tablet (20 mg total) by mouth daily. 90 tablet 1  . clotrimazole-betamethasone (LOTRISONE) cream Apply topically 2 (two) times daily as needed. 30 g 5  . FEROSUL 325 (65 Fe) MG tablet TAKE 1 TABLET BY MOUTH DAILY  30 tablet 3  . glucose blood (ONETOUCH ULTRA) test strip Check blood sugar 2-3 times daily.  Dx Code: E11.9 (Patient taking differently: Check blood sugar 2-3 times daily.  Dx Code: E11.9) 300 each 12  . insulin NPH-regular Human (NOVOLIN 70/30) (70-30) 100 UNIT/ML injection Inject 18 Units into the skin 2 (two) times daily with a meal. 30 mL 3  . Insulin Syringes, Disposable, U-100 0.3 ML MISC 1 Device by Does not apply route in the morning and at bedtime. 200 each 3  . losartan (COZAAR) 50 MG tablet Take 1 tablet (50 mg total) by mouth at bedtime. 90 tablet 1  . magnesium oxide (MAGNESIUM-OXIDE) 400 (241.3 Mg) MG tablet Take 1 tablet (400 mg total) by mouth 2 (two) times daily. 180 tablet 3  . metFORMIN (GLUCOPHAGE) 1000 MG tablet Take 1 tablet (1,000 mg total) by mouth 2 (two) times daily with a meal. 180 tablet 1  . nitroGLYCERIN (NITROSTAT) 0.4 MG SL tablet Place 1 tablet (0.4 mg total) under the tongue every 5 (five) minutes as needed for chest pain. 100 tablet 3  . pantoprazole (PROTONIX) 40 MG tablet Take 1 tablet (40 mg total) by mouth 2 (two) times daily before a meal. 180 tablet 3  . Potassium 99 MG TABS Take 1 tablet by mouth daily.    . primidone (MYSOLINE) 50 MG tablet TAKE THREE TABLETS BY MOUTH EVERY MORNING and TAKE TWO TABLETS BY MOUTH EVERY EVENING 450 tablet 1   No current facility-administered medications for this visit.   Allergies:  Trulicity [dulaglutide]   ROS: No palpitations or syncope.  Physical Exam: VS:  BP (!) 138/56   Pulse 75   Ht 6' 2"  (1.88 m)   Wt 214 lb (97.1 kg)   SpO2 96%   BMI 27.48 kg/m , BMI Body mass index is 27.48 kg/m.  Wt Readings from Last 3 Encounters:  11/06/20 214 lb (97.1 kg)  10/22/20 219 lb (99.3 kg)  10/03/20 221 lb (100.2 kg)    General: Patient appears comfortable at rest. HEENT: Conjunctiva and lids normal, wearing a mask. Neck: Supple, no elevated JVP or carotid bruits, no thyromegaly. Lungs: Clear to auscultation,  nonlabored breathing at rest. Cardiac: Regular rate and rhythm, no S3, soft systolic murmur, no pericardial rub. Extremities: No pitting edema.  ECG:  An ECG dated 12/26/2019 was personally reviewed today and demonstrated:  Sinus rhythm with LVH/IVCD.  Recent Labwork: 10/03/2020: ALT 47; AST 37; BUN 10; Creatinine, Ser 0.71; Hemoglobin 12.6; Platelets 226.0; Potassium 4.3; Sodium 140     Component Value Date/Time   CHOL 78 04/01/2020 0910   TRIG 106.0 04/01/2020 0910   HDL 32.80 (L) 04/01/2020 0910   CHOLHDL 2 04/01/2020 0910   VLDL 21.2 04/01/2020 0910   LDLCALC 24 04/01/2020 0910   LDLDIRECT 145.0 04/26/2018 1026    Other Studies Reviewed Today:  [facility] Myoview 12/15/2017:  There was no ST segment deviation noted during stress.  Findings consistent with larege prior inferior myocardial infarction.  This is a high risk study. Risked based on decreased LVEF. There is no current myocardium at jeopardy. Consider correlating LVEF with echocardiogram.  The left ventricular ejection fraction is moderately decreased (30-44%).  Echocardiogram7/29/2021: 1. Left ventricular ejection fraction, by estimation, is 50 to 55%. The  left ventricle has low normal function. The left ventricle has no regional  wall motion abnormalities. Left ventricular diastolic parameters are  consistent with Grade I diastolic  dysfunction (impaired relaxation).  2. Right ventricular systolic function is normal. The right ventricular  size is normal. There is normal pulmonary artery systolic pressure.  3. Left atrial size was severely dilated.  4. The mitral valve is normal in structure. Mild mitral valve  regurgitation. No evidence of mitral stenosis.  5. The aortic valve is tricuspid. Aortic valve regurgitation is not  visualized. No aortic stenosis is present.  6. Aortic dilatation noted. There is mild dilatation of the aortic root  measuring 41 mm.  7. The inferior vena cava is normal in size  with greater than 50%  respiratory variability, suggesting right atrial pressure of 3 mmHg.  8. Probable small PFO with left to right shunt.   Assessment and Plan:  1.  CAD status post prior LAD PCI and most recently Cutting Balloon angioplasty for in-stent restenosis in 2014 with angioplasty of diagonal branch.  He is doing well without active angina on medical therapy.  Last ischemic testing was in 2019, no clear indication for repeat testing at this time.  ECG reviewed and stable.  Continue aspirin, Lipitor, Coreg, losartan, and as needed nitroglycerin.  2.  History of cardiomyopathy with LVEF 50 to 55% as of July 2021.  3.  Mildly dilated aortic root at 41 mm by echocardiography, asymptomatic.  Blood pressure is adequately controlled today.  Medication Adjustments/Labs and Tests Ordered: Current medicines are reviewed at length with the patient today.  Concerns regarding medicines are outlined above.   Tests Ordered: Orders Placed This Encounter  Procedures  . EKG 12-Lead    Medication Changes: No orders of the defined types were placed in this encounter.   Disposition:  Follow up 6 months in the [place] office.  Signed, Satira Sark, MD, [facility] 11/06/2020 10:06 AM    [facility] [place], [place], [place] [address] Phone: [phone]; Fax: [phone]

## 2020-11-06 ENCOUNTER — Ambulatory Visit (INDEPENDENT_AMBULATORY_CARE_PROVIDER_SITE_OTHER): Payer: PPO | Admitting: Cardiology

## 2020-11-06 ENCOUNTER — Encounter: Payer: Self-pay | Admitting: Cardiology

## 2020-11-06 VITALS — BP 138/56 | HR 75 | Ht 74.0 in | Wt 214.0 lb

## 2020-11-06 DIAGNOSIS — I77810 Thoracic aortic ectasia: Secondary | ICD-10-CM

## 2020-11-06 DIAGNOSIS — Z8679 Personal history of other diseases of the circulatory system: Secondary | ICD-10-CM

## 2020-11-06 DIAGNOSIS — I25119 Atherosclerotic heart disease of native coronary artery with unspecified angina pectoris: Secondary | ICD-10-CM | POA: Diagnosis not present

## 2020-11-06 NOTE — Patient Instructions (Addendum)

## 2020-11-20 ENCOUNTER — Telehealth: Payer: Self-pay | Admitting: Pharmacist

## 2020-11-20 NOTE — Progress Notes (Addendum)
    Chronic Care Management Pharmacy Assistant   Name: Randy Castro.  MRN: [id number] DOB: 08/18/1951   Reason for Encounter: Adherence Review  Verified Adherence Gap Information. Per insurance data, the patient is 90-99% compliant with the CHOL and HTN medication. The patient is 100% complaint with the DM medication. Per insurance data patient has met their colorectal cancer screening. The patient has not met their wellness bundle or their annual wellness screening. Their most recent A1C was 9.7 on 04/01/20. The patients total gaps-all measures is equal to 5. The patients total gaps-star measures is equal to 2.    Follow-Up: Pharmacist Review  Charlann Lange, [place] Pharmacist Assistant [phone]

## 2020-11-25 ENCOUNTER — Ambulatory Visit (INDEPENDENT_AMBULATORY_CARE_PROVIDER_SITE_OTHER): Payer: PPO | Admitting: Pharmacist

## 2020-11-25 DIAGNOSIS — I2510 Atherosclerotic heart disease of native coronary artery without angina pectoris: Secondary | ICD-10-CM

## 2020-11-25 DIAGNOSIS — E1165 Type 2 diabetes mellitus with hyperglycemia: Secondary | ICD-10-CM

## 2020-11-25 DIAGNOSIS — E1159 Type 2 diabetes mellitus with other circulatory complications: Secondary | ICD-10-CM

## 2020-11-25 DIAGNOSIS — I10 Essential (primary) hypertension: Secondary | ICD-10-CM | POA: Diagnosis not present

## 2020-11-25 DIAGNOSIS — F32A Depression, unspecified: Secondary | ICD-10-CM | POA: Diagnosis not present

## 2020-11-25 DIAGNOSIS — E7800 Pure hypercholesterolemia, unspecified: Secondary | ICD-10-CM

## 2020-11-25 DIAGNOSIS — F419 Anxiety disorder, unspecified: Secondary | ICD-10-CM

## 2020-11-25 NOTE — Chronic Care Management (AMB) (Signed)
Chronic Care Management Pharmacy Note  11/25/2020 Name:  Randy Castro. MRN:  [id number] DOB:  July 27, 1951  Subjective: Randy Foots. is an 70 y.o. year old male who is a primary patient of Paz, Alda Berthold, MD.  The CCM team was consulted for assistance with disease management and care coordination needs.    Engaged with patient by telephone for follow up visit in response to provider referral for pharmacy case management and/or care coordination services.   Consent to Services:  The patient was given information about Chronic Care Management services, agreed to services, and gave verbal consent prior to initiation of services.  Please see initial visit note for detailed documentation.   Patient Care Team: Colon Branch, MD as PCP - General (Internal Medicine) Satira Sark, MD as PCP - Cardiology (Cardiology) Larey Dresser, MD as Consulting Physician (Cardiology) Lake Bells., MD as Referring Physician (Gastroenterology) Gearlean Alf, PA-C as Physician Assistant (Pulmonary Disease) Demetrius Revel, MD as Consulting Physician (Bariatrics) Younts, Javier Glazier, NP as Nurse Practitioner (Endocrinology) [name], [name], DO as Consulting Physician (Neurology) [name], PharmD (Pharmacist)  Recent office visits: 10/03/2020 - PCP (Dr Larose Kells) Seen for CPE; noted BP was elevated; No med changes but was instructed to check BP at home; Noted dizziness and fall x2; Referred to neuro for evaluation.  Recent consult visits: 11/06/2020 - Cardio Domenic Polite) - CAD routine f/u. No medication changes.   10/22/2020 - Neuro (Dr [name]) - f/u essential tremor, anemia and gait instability.  Gait instability thought to be related to peripheral neuorpathy. EMG ordered. Recommended balance therapy and use of assisted device. Pt wanted to hold off on balance therapy. Recommended improved BG control - working with Dr Kelton Pillar). Also checked B12, RPR, SPEP/UPEP with immunifixation. B12 level was in low  normal range.  Recommended OTC B12 by mouth 1028mg daily.  Hospital visits: None in previous 6 months  Objective:  Lab Results  Component Value Date   CREATININE 0.71 10/03/2020   CREATININE 0.66 04/15/2020   CREATININE 0.65 11/30/2019    Lab Results  Component Value Date   HGBA1C 8.7 (A) 09/03/2020   Last diabetic Eye exam:  Lab Results  Component Value Date/Time   HMDIABEYEEXA Retinopathy (A) 07/19/2019 12:00 AM    Last diabetic Foot exam:  Lab Results  Component Value Date/Time   HMDIABFOOTEX Done 03/19/2019 12:00 AM        Component Value Date/Time   CHOL 78 04/01/2020 0910   TRIG 106.0 04/01/2020 0910   HDL 32.80 (L) 04/01/2020 0910   CHOLHDL 2 04/01/2020 0910   VLDL 21.2 04/01/2020 0910   LDLCALC 24 04/01/2020 0910   LDLDIRECT 145.0 04/26/2018 1026    Hepatic Function Latest Ref Rng & Units 10/22/2020 10/03/2020 11/30/2019  Total Protein 6.1 - 8.1 g/dL 5.9(L) 6.1 5.7(L)  Albumin 3.5 - 5.2 g/dL - 4.0 3.9  AST 0 - 37 U/L - 37 33  ALT 0 - 53 U/L - 47 42  Alk Phosphatase 39 - 117 U/L - 69 80  Total Bilirubin 0.2 - 1.2 mg/dL - 0.5 0.5  Bilirubin, Direct <=0.2 mg/dL - - -    Lab Results  Component Value Date/Time   TSH 0.92 04/26/2018 10:26 AM   TSH 0.73 01/02/2016 11:06 AM    CBC Latest Ref Rng & Units 10/03/2020 04/01/2020 11/30/2019  WBC 4.0 - 10.5 K/uL 6.0 6.5 6.2  Hemoglobin 13.0 - 17.0 g/dL 12.6(L) 11.8(L) 10.8(L)  Hematocrit 39.0 -  52.0 % 37.9(L) 35.1(L) 31.9(L)  Platelets 150.0 - 400.0 K/uL 226.0 246.0 303.0    No results found for: VD25OH  Clinical ASCVD: Yes  The ASCVD Risk score Mikey Bussing DC Jr., et al., 2013) failed to calculate for the following reasons:   The patient has a prior MI or stroke diagnosis    Other: (CHADS2VASc if Afib, PHQ9 if depression, MMRC or CAT for COPD, ACT, DEXA)  Social History   Tobacco Use  Smoking Status Former Smoker  . Packs/day: 1.00  . Years: 25.00  . Pack years: 25.00  . Types: Cigarettes  . Quit date:  04/12/1992  . Years since quitting: 28.6  Smokeless Tobacco Never Used   BP Readings from Last 3 Encounters:  11/06/20 (!) 138/56  10/22/20 (!) 150/74  10/03/20 (!) 162/80   Pulse Readings from Last 3 Encounters:  11/06/20 75  10/22/20 68  10/03/20 60   Wt Readings from Last 3 Encounters:  11/06/20 214 lb (97.1 kg)  10/22/20 219 lb (99.3 kg)  10/03/20 221 lb (100.2 kg)    Assessment: Review of patient past medical history, allergies, medications, health status, including review of consultants reports, laboratory and other test data, was performed as part of comprehensive evaluation and provision of chronic care management services.   SDOH:  (Social Determinants of Health) assessments and interventions performed:    CCM Care Plan  Allergies  Allergen Reactions  . Trulicity [Dulaglutide] Nausea And Vomiting    Medications Reviewed Today    Reviewed by [name], PharmD (Pharmacist) on 11/25/20 at [phone]  Med List Status: <None>  Medication Order Taking? Sig Documenting Provider Last Dose Status Informant  aspirin EC 81 MG tablet [id number] Yes Take 81 mg by mouth daily. [provider] Taking Active   atorvastatin (LIPITOR) 80 MG tablet [id number] Yes Take 1 tablet (80 mg total) by mouth at bedtime. Colon Branch, MD Taking Active   Blood Glucose Monitoring Suppl (ONE TOUCH ULTRA 2) w/Device KIT [id number] Yes by Does not apply route. [provider] Taking Active   carvedilol (COREG) 12.5 MG tablet [id number] Yes Take 1 tablet (12.5 mg total) by mouth 2 (two) times daily with a meal. Colon Branch, MD Taking Active   citalopram (CELEXA) 20 MG tablet [id number] Yes Take 1 tablet (20 mg total) by mouth daily. Colon Branch, MD Taking Active   clotrimazole-betamethasone Donalynn Furlong) cream [id number] Yes Apply topically 2 (two) times daily as needed. Colon Branch, MD Taking Active   FEROSUL 325 859 584 1202 Fe) MG tablet [id number] Yes TAKE 1 TABLET BY MOUTH DAILY Cirigliano, Vito V,  DO Taking Active   glucose blood (ONETOUCH ULTRA) test strip [id number] Yes Check blood sugar 2-3 times daily.  Dx Code: E11.9  Patient taking differently: Check blood sugar 2-3 times daily.  Dx Code: E11.9   Colon Branch, MD Taking Active   losartan (COZAAR) 50 MG tablet [id number] Yes Take 1 tablet (50 mg total) by mouth at bedtime. Colon Branch, MD Taking Active   magnesium oxide (MAGNESIUM-OXIDE) 400 (241.3 Mg) MG tablet [id number] Yes Take 1 tablet (400 mg total) by mouth 2 (two) times daily. Colon Branch, MD Taking Active   metFORMIN (GLUCOPHAGE) 1000 MG tablet [id number] Yes Take 1 tablet (1,000 mg total) by mouth 2 (two) times daily with a meal. Colon Branch, MD Taking Active   nitroGLYCERIN (NITROSTAT) 0.4 MG SL tablet [id number] Yes Place 1 tablet (0.4 mg total) under the tongue every 5 (five) minutes  as needed for chest pain. Satira Sark, MD Taking Active            Med Note [facility]   Mon Nov 25, 2020  9:11 AM)    NOVOLIN 70/30 FLEXPEN (70-30) 100 UNIT/ML KwikPen [id number] Yes Inject 18 Units into the skin in the morning and at bedtime. [provider] Taking Active   pantoprazole (PROTONIX) 40 MG tablet [id number] Yes Take 1 tablet (40 mg total) by mouth 2 (two) times daily before a meal. Colon Branch, MD Taking Active   Potassium 99 MG TABS [id number] Yes Take 1 tablet by mouth daily. [provider] Taking Active Spouse/Significant Other  primidone (MYSOLINE) 50 MG tablet [id number] Yes TAKE THREE TABLETS BY MOUTH EVERY MORNING and TAKE TWO TABLETS BY MOUTH EVERY EVENING [name], [name], DO Taking Active   vitamin B-12 (CYANOCOBALAMIN) 1000 MCG tablet [id number] Yes Take 1,000 mcg by mouth daily. [provider] Taking Active           Patient Active Problem List   Diagnosis Date Noted  . Type 2 diabetes mellitus with hyperglycemia, with long-term current use of insulin ([place]) 04/30/2020  . DM (diabetes mellitus) with complications ([place]) 38/45/3646  .  Type 2 diabetes mellitus with diabetic polyneuropathy, with long-term current use of insulin ([place]) 04/30/2020  . Diabetes mellitus ([place]) 04/30/2020  . Iliac artery aneurysm, left ([place]) 11/01/2019  . Lower GI bleed 10/24/2019  . 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 Abdominal pain 11/17/2018  . Cardiac arrhythmia, unspecified 11/14/2017  . Hypomagnesemia 11/14/2017  . Anemia 11/14/2017  . Bleeding skin mole 11/14/2017  . OSA on CPAP 09/21/2017  . Obesity 01/23/2016  . PCP NOTES >>> 06/08/2015  . Anxiety and depression 05/31/2013  . Intermediate coronary syndrome ([place]) 12/22/2012  . Unstable angina ([place]) 12/16/2012  . Fatigue 04/26/2012  . Annual physical exam 10/28/2011  . Rash 10/28/2011  . Coronary Artery Disease 08/28/2011  . 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  . Palpitations 08/28/2011  . NSTEMI (non-ST elevated myocardial infarction) ([place]) 08/07/2011  . DJD (degenerative joint disease) 05/28/2011  . Poorly controlled type 2 diabetes mellitus with circulatory disorder ([place])   . Hyperlipidemia   . HTN (hypertension)     Immunization History  Administered Date(s) Administered  . Fluad Quad(high Dose 65+) 05/04/2019  . Influenza Split 06/30/2011  . Influenza, High Dose Seasonal PF 05/31/2013, 05/27/2017, 06/07/2018  . Influenza, Seasonal, Injecte, Preservative Fre 07/28/2012  . Influenza,inj,Quad PF,6+ Mos 05/29/2014, 06/07/2015, 04/23/2016  . Influenza-Unspecified 05/24/2020  . PFIZER(Purple Top)SARS-COV-2 Vaccination 10/11/2019, 11/01/2019, 05/20/2020  . Pneumococcal Conjugate-13 12/05/2014  . Pneumococcal Polysaccharide-23 08/07/2011, 10/12/2018  . Tdap 05/31/2013  . Zoster 11/28/2012    Conditions to be addressed/monitored: CAD, HTN, HLD, DMII, Anxiety, Depression and B12 deficiency; iron def anemia; ;essential tremor; peripheral neuropathy; hypomagnesemia; h/o gastric bypass surgery; h/o lower GI bleed and GERD  Care Plan : General Pharmacy (Adult)   Updates made by [name], PHARMD since 11/25/2020 12:00 AM    Problem: Management of Chronic Conditions: Hypertension, Hyperlipidemia, Heart Disease, Diabetes, Depression, GERD/History of GI Bleed, Essential Tremor, Hypomagnesemia, neuropathy   Priority: High  Onset Date: 11/25/2020  Note:   Current Barriers:  . Unable to achieve control of type 2 DM and HTN   Pharmacist Clinical Goal(s):  Marland Kitchen Over the next 90 days, patient will achieve adherence to monitoring guidelines and medication adherence to achieve therapeutic efficacy . achieve control of diabetes and HTN as evidenced by A1c <8.0  and BP <140/90  through collaboration with PharmD and provider.   Interventions: . 1:1 collaboration with Colon Branch, MD regarding development and update of comprehensive plan of care as evidenced by provider attestation and co-signature . Inter-disciplinary care team collaboration (see longitudinal plan of care) . Comprehensive medication review performed; medication list updated in electronic medical record  Diabetes: . Marland Kitchen Uncontrolled current treatment:  . Novolin 70/30 18 units units twice daily (recently switched form vial to pens) . Metformin 1057m twice daily . Has tried Trulicity in past - caused nausea (also has h/o gastric bypass surgery and lower GI bleed). Tried jardiance and invokana - Jadiance stopped due to cost and Invokana was non formulary; Lantus taken in past but stopped due to cost.  .       Current glucose readings: only checking once daily. Per patient mostly in 140 - 150's but highest 210 and lowest 65.  .Marland KitchenReports occasional hypoglycemic symptoms . Admits to eating high CHO / sugar snacks . Current exercise: walking some, renovating home, yardwork . Educated on how to treat hypoglycemia Counseled on BG goals and reminded to try to check BG 2 to 3 times daily Recommended consideration of SGLT2 in future thought cost has been issue in past. He might qualify for FIranpatient  assistance.   Hypertension: . Uncontrolled with current treatment:Carvedilol 12.510mtwice daily and Losartan 5020maily . Current home readings: patient has not started checking at home as recommended by PCP . Reports occasional dizziness. Evaluated by neuro.  . Counseled on checking BP at home to get better idea of BP outside of office.  Recommended If BP continues to be above goal; consider increaseing losartan to 100m23mily or add HCTZ to regimen ,   Hyperlipidemia / CAD: . Controlled; current treatment: Aspirin 81mg29mly; Atorvastatin 80mg 1my; Nitroglycerin 0.4mg as16meded . Recommended continue current therapy  Depression/Anxiety/: . Controlled; current treatment:citalopram 20mg da36m  . Recommended continue current therapy  Neuropathy . Controlled / Current regimen: OTC B12 1000mcg da88m . Interventions: o Consider repeat B12 in 3 to 6 months o Discussed he consider balance therapy as recommended by Dr [name] (neuro)  Hypomagnesemia . Current regimen:  o Magnesium oxide 400mg twic93mily . Interventions: o Consider repeat Magnesium level at next office visit  Health Maintenance  . Interventions: o Recommended patient complete Shingrix vaccine series at pharmacy. Reminded he can get at either local pharmacy or pharmacy in our office when he sees endo on May or Dr Paz in [place].  o Discussed diet and exercise o Continue to be active daily either walking, yardwork o Limit snacking and serving sizes.   Medication management . Current pharmacy: [facility] . Interventions o Comprehensive medication review performed. o Utilize [facility] pharmacy for medication synchronization, packaging and delivery o  Patient Goals/Self-Care Activities . Over the next 90 days, patient will:  take medications as prescribed, check glucose 2 to 3 times a day2, document, and provide at future appointments, check blood pressure 1 to 2 times per week, document, and provide at future  appointments, and engage in dietary modifications by decreasing intake of high sugar / CHO snacks and limiting serving sizes  Follow Up Plan: Telephone follow up appointment with care management team member scheduled for:  3 months       Medication Assistance: None required.  Patient affirms current coverage meets needs.  Patient's preferred pharmacy is:  [facility] [place]  [address] [place] [address] Phone: [phone] Fax: [place], [place] [address] [place] [address] Phone: [phone] Fax: [phone]  Uses pill box? No - using packaging from [facility] Pt endorses 95% compliance  Follow Up:  Patient requests no follow-up at this time.  Plan: Telephone follow up appointment with care management team member scheduled for:  3 months  [name], PharmD Clinical Pharmacist [place] [facility]

## 2020-11-25 NOTE — Patient Instructions (Signed)
Visit Information  PATIENT GOALS: Goals Addressed            This Visit's Progress   . Chronic Care Management Pharmacy Plan   On track    CARE PLAN ENTRY (see longitudinal plan of care for additional care plan information)  Current Barriers:  . Chronic Disease Management support, education, and care coordination needs related to Hypertension, Hyperlipidemia, Heart Disease, Diabetes, Depression, GERD/History of GI Bleed, Essential Tremor, Hypomagnesemia, neuropathy   Hypertension BP Readings from Last 3 Encounters:  11/06/20 (!) 138/56  10/22/20 (!) 150/74  10/03/20 (!) 162/80   . Pharmacist Clinical Goal(s): o Over the next 90 days, patient will work with PharmD and providers to achieve BP goal <140/90 . Current regimen:  . Carvedilol 12.5mg  twice daily . Losartan 50mg  daily . Interventions: o Requested patient check his BP 1 to 2 times per week and record . Patient self care activities - Over the next 90 days, patient will: o Check BP 1 to 2  times per week, document, and provide at future appointments o Ensure daily salt intake < 2300 mg/day  Hyperlipidemia/CAD Lab Results  Component Value Date/Time   LDLCALC 24 04/01/2020 09:10 AM   LDLDIRECT 145.0 04/26/2018 10:26 AM   . Pharmacist Clinical Goal(s): o Over the next 90 days, patient will work with PharmD and providers to maintain LDL goal < 70  . Current regimen:  . Aspirin 81mg  daily . Atorvastatin 80mg  daily . Nitroglycerin 0.4mg  as needed . Patient self care activities - Over the next 90 days, patient will: o Maintain cholesterol medication regimen.   Diabetes Lab Results  Component Value Date/Time   HGBA1C 8.7 (A) 09/03/2020 11:26 AM   HGBA1C 9.7 (H) 04/01/2020 09:10 AM   HGBA1C 7.7 09/12/2019 12:00 AM   HGBA1C 8.2 04/20/2019 12:00 AM   . Pharmacist Clinical Goal(s): o Over the next 90 days, patient will work with PharmD and providers to achieve A1c goal <7% while preventing increase episodes of  hypoglycemia in coordination with patient's endocrinologist . Current regimen:  . Novolin 70/30 18 units units twice daily (recently switched form vial to pens) . Metformin 1000mg  twice daily . Interventions: o Discussed diet and exercise o Reviewed goals for diabetes o Reviewed treatment of hypoglycemia / low blood glucose o Reminded to not skip meals  . Patient self care activities - Over the next 90 days, patient will: o Check blood sugar twice daily, document, and provide at future appointments o Contact provider with any episodes of hypoglycemia o Decreased sweet snacks like banana pudding and cookies; instead try smaller servings size (1/2 cup) or lower carbohydrate snack (popcorn, veggies, low fat cheese)  Neuropathy . Pharmacist Clinical Goal(s) o Over the next 90 days, patient will work with PharmD and providers to maintain magnesium within normal limits . Current regimen:  o OTC B12 1077mcg daily  . Interventions: o Consider repeat B12 in 3 to 6 months o Consider balance therapy as recommended by Dr [name] (neuro) . Patient self care activities - Over the next 90 days, patient will: o Continue B12 1041mcg daily o Complete B12 level in 3 to 6 months o Recommend balance therapy to prevent future falls.    Hypomagnesemia . Pharmacist Clinical Goal(s) o Over the next 90 days, patient will work with PharmD and providers to maintain magnesium within normal limits . Current regimen:  o Magnesium oxide 400mg  twice daily . Interventions: o Consider repeat Magnesium level at next office visit . Patient self care  activities - Over the next 90 days, patient will: o Maintain magnesium medication regimen o Complete magnesium level at next office visit   Health Maintenance  . Pharmacist Clinical Goal(s) o Over the next 90 days, patient will work with PharmD and providers to complete health maintenance screenings/vaccinations . Interventions: o Recommended patient complete Shingrix  vaccine series at pharmacy. Reminded he can get at either local pharmacy or pharmacy in our office when he sees endo on May or Dr Larose Kells in August.  o Discussed diet and exercise . Patient self care activities - Over the next 90 days, patient will: o Continue to be active daily either walking, yardwork o Limit snacking and serving sizes.  o Complete Shingrix Vaccine Series   Medication management . Pharmacist Clinical Goal(s): o Over the next 90 days, patient will work with PharmD and providers to maintain optimal medication adherence . Current pharmacy: [facility] switched to [facility] . Interventions o Comprehensive medication review performed. o Utilize [facility] pharmacy for medication synchronization, packaging and delivery . Patient self care activities - Over the next 90 days, patient will: o Focus on medication adherence by filling and taking medications appropriately  o Take medications as prescribed o Report any questions or concerns to PharmD and/or provider(s)  Please see past updates related to this goal by clicking on the "Past Updates" button in the selected goal         The patient verbalized understanding of instructions, educational materials, and care plan provided today and declined offer to receive copy of patient instructions, educational materials, and care plan.   Telephone follow up appointment with care management team member scheduled for:  3 months  [name], PharmD Clinical Pharmacist [place] [phone]

## 2020-12-02 ENCOUNTER — Telehealth: Payer: Self-pay | Admitting: Pharmacist

## 2020-12-02 ENCOUNTER — Other Ambulatory Visit: Payer: Self-pay

## 2020-12-02 DIAGNOSIS — R202 Paresthesia of skin: Secondary | ICD-10-CM

## 2020-12-02 NOTE — Progress Notes (Addendum)
Chronic Care Management Pharmacy Assistant   Name: Randy Castro.  MRN: [id number] DOB: Aug 13, 1951  Reason for Encounter: Medication Review/Medication Coordination Call.   Medications: Outpatient Encounter Medications as of 12/02/2020  Medication Sig   aspirin EC 81 MG tablet Take 81 mg by mouth daily.   atorvastatin (LIPITOR) 80 MG tablet Take 1 tablet (80 mg total) by mouth at bedtime.   Blood Glucose Monitoring Suppl (ONE TOUCH ULTRA 2) w/Device KIT by Does not apply route.   carvedilol (COREG) 12.5 MG tablet Take 1 tablet (12.5 mg total) by mouth 2 (two) times daily with a meal.   citalopram (CELEXA) 20 MG tablet Take 1 tablet (20 mg total) by mouth daily.   clotrimazole-betamethasone (LOTRISONE) cream Apply topically 2 (two) times daily as needed.   FEROSUL 325 (65 Fe) MG tablet TAKE 1 TABLET BY MOUTH DAILY   glucose blood (ONETOUCH ULTRA) test strip Check blood sugar 2-3 times daily.  Dx Code: E11.9 (Patient taking differently: Check blood sugar 2-3 times daily.  Dx Code: E11.9)   losartan (COZAAR) 50 MG tablet Take 1 tablet (50 mg total) by mouth at bedtime.   magnesium oxide (MAGNESIUM-OXIDE) 400 (241.3 Mg) MG tablet Take 1 tablet (400 mg total) by mouth 2 (two) times daily.   metFORMIN (GLUCOPHAGE) 1000 MG tablet Take 1 tablet (1,000 mg total) by mouth 2 (two) times daily with a meal.   nitroGLYCERIN (NITROSTAT) 0.4 MG SL tablet Place 1 tablet (0.4 mg total) under the tongue every 5 (five) minutes as needed for chest pain.   NOVOLIN 70/30 FLEXPEN (70-30) 100 UNIT/ML KwikPen Inject 18 Units into the skin in the morning and at bedtime.   pantoprazole (PROTONIX) 40 MG tablet Take 1 tablet (40 mg total) by mouth 2 (two) times daily before a meal.   Potassium 99 MG TABS Take 1 tablet by mouth daily.   primidone (MYSOLINE) 50 MG tablet TAKE THREE TABLETS BY MOUTH EVERY MORNING and TAKE TWO TABLETS BY MOUTH EVERY EVENING   vitamin B-12 (CYANOCOBALAMIN) 1000 MCG tablet Take 1,000 mcg  by mouth daily.   No facility-administered encounter medications on file as of 12/02/2020.   Reviewed chart for medication changes ahead of medication coordination call.  No Consults, or hospital visits since last adherence review.  Office Visits: 11/25/20 [name], PharmD. For Chronic Care Management. Per note: patient is to take medications as prescribed, check glucose 2 to 3 times a day2, document, and provide at future appointments, check blood pressure 1 to 2 times per week. STOPPED Insulin 70/30 100 units, Insulin syringes.   BP Readings from Last 3 Encounters:  11/06/20 (!) 138/56  10/22/20 (!) 150/74  10/03/20 (!) 162/80    Lab Results  Component Value Date   HGBA1C 8.7 (A) 09/03/2020     Patient obtains medications through Adherence Packaging  90 Days   Last adherence delivery included: (90 DS 11/13/20) Atorvastatin 80 mg tab with Breakfast Carvedilol 12.5 mg tab with Breakfast, one tat Bedtime Citalopram 20 mg tab with Breakfast clotrimazole-betamethasone cream  Losartan 50 mg tab at Bedtime Magnesium Oxide 400 mg one tab with Breakfast, one at Bedtime Metformin 1000 mg tab with Breakfast, one at Bedtime Pantoprazole 40 mg tab with Breakfast, one at Bedtime Primidone 50 mg tab; three tabs with Breakfast, two tabs at Bedtime   Patient declined meds last month: NOVOLIN 70/30 Flex Pen (D/C) Syringes 3 MI 21g (D/C)  Patient is not due for an adherence delivery at this  time.  Called patient and reviewed medications and coordinated delivery.  This delivery to include: None  Patient declined the following medications:(90 DS 11/13/20) Atorvastatin 80 mg tab with Breakfast Carvedilol 12.5 mg tab with Breakfast, one tat Bedtime Citalopram 20 mg tab with Breakfast clotrimazole-betamethasone cream  Losartan 50 mg tab at Bedtime Magnesium Oxide 400 mg one tab with Breakfast, one at Bedtime Metformin 1000 mg tab with Breakfast, one at Bedtime Pantoprazole 40 mg tab  with Breakfast, one at Bedtime Primidone 50 mg tab; three tabs with Breakfast, two tabs at Bedtime   Patient does not need refills at this time.  Follow-Up:Pharmacist Review  Charlann Lange, RMA Clinical Pharmacist Assistant [phone]  10 minutes spent in review, coordination, and documentation.  Reviewed by: Beverly Milch, PharmD Clinical Pharmacist [facility] [phone]

## 2020-12-04 ENCOUNTER — Ambulatory Visit: Payer: PPO | Admitting: Neurology

## 2020-12-04 ENCOUNTER — Other Ambulatory Visit: Payer: Self-pay

## 2020-12-04 DIAGNOSIS — R202 Paresthesia of skin: Secondary | ICD-10-CM

## 2020-12-04 DIAGNOSIS — E1142 Type 2 diabetes mellitus with diabetic polyneuropathy: Secondary | ICD-10-CM

## 2020-12-04 NOTE — Procedures (Signed)
[facility]  [place], [address]  [place], [place] [address] Tel: [phone] Fax:  [phone] Test Date:  12/04/2020  Patient: Randy Castro, Randy Castro. DOB: 06-May-1951 Physician: Narda Amber, DO  Sex: Male Height: 6\' 2"  Ref Phys: Alonza Bogus, D.O.  ID#: [id number]   Technician:    Patient Complaints: This is a 70 year old man with diabetes mellitus referred for evaluation of bilateral leg paresthesias and gait unsteadiness  NCV & EMG Findings: Extensive electrodiagnostic testing of the right lower extremity and additional studies of the left shows:  1. Bilateral sural and superficial peroneal sensory responses are absent. 2. Bilateral peroneal motor responses at the extensor digitorum brevis are absent, and normal at the tibialis anterior.  Bilateral tibial motor responses are absent. 3. Bilateral tibial H reflex studies are absent. 4. Chronic motor axonal changes are seen affecting the lower extremities and conform to a gradient pattern, which is worse distally.  Impression: The electrophysiologic findings are consistent with a chronic and symmetric sensorimotor axonal polyneuropathy affecting the lower extremities.  Overall, these findings are severe in degree electrically.   ___________________________ Narda Amber, DO    Nerve Conduction Studies Anti Sensory Summary Table   Stim Site NR Peak (ms) Norm Peak (ms) P-T Amp (V) Norm P-T Amp  Left Sup Peroneal Anti Sensory (Ant Lat Mall)  32C  12 cm NR  <4.6  >3  Right Sup Peroneal Anti Sensory (Ant Lat Mall)  32C  12 cm NR  <4.6  >3  Left Sural Anti Sensory (Lat Mall)  32C  Calf NR  <4.6  >3  Right Sural Anti Sensory (Lat Mall)  32C  Calf NR  <4.6  >3   Motor Summary Table   Stim Site NR Onset (ms) Norm Onset (ms) O-P Amp (mV) Norm O-P Amp Site1 Site2 Delta-0 (ms) Dist (cm) Vel (m/s) Norm Vel (m/s)  Left Peroneal Motor (Ext Dig Brev)  32C  Ankle NR  <6.0  >2.5 B Fib Ankle  0.0  >40  B Fib NR     Poplt  B Fib  0.0  >40  Poplt NR            Right Peroneal Motor (Ext Dig Brev)  32C  Ankle NR  <6.0  >2.5 B Fib Ankle  0.0  >40  B Fib NR     Poplt B Fib  0.0  >40  Poplt NR            Left Peroneal TA Motor (Tib Ant)  32C  Fib Head    3.6 <4.5 3.4 >3 Poplit Fib Head 1.9 10.0 53 >40  Poplit    5.5  3.3         Right Peroneal TA Motor (Tib Ant)  32C  Fib Head    3.2 <4.5 3.6 >3 Poplit Fib Head 2.0 10.0 50 >40  Poplit    5.2  3.5         Left Tibial Motor (Abd Hall Brev)  32C  Ankle NR  <6.0  >4 Knee Ankle  0.0  >40  Knee NR            Right Tibial Motor (Abd Hall Brev)  32C  Ankle NR  <6.0  >4 Knee Ankle  0.0  >40  Knee NR             H Reflex Studies   NR H-Lat (ms) Lat Norm (ms) L-R H-Lat (ms)  Left Tibial (Gastroc)  32C  NR  <35   Right Tibial (Gastroc)  32C  NR  <35    EMG   Side Muscle Ins Act Fibs Psw Fasc Number Recrt Dur Dur. Amp Amp. Poly Poly. Comment  Right AntTibialis Nml Nml Nml Nml 2- Rapid Many 1+ Many 1+ Many 1+ N/A  Right Gastroc Nml Nml Nml Nml 2- Rapid Many 1+ Many 1+ Many 1+ N/A  Right Flex Dig Long Nml Nml Nml Nml 2- Rapid Many 1+ Many 1+ Many 1+ N/A  Right RectFemoris Nml Nml Nml Nml 1- Rapid Some 1+ Some 1+ Some 1+ N/A  Right GluteusMed Nml Nml Nml Nml Nml Nml Nml Nml Nml Nml Nml Nml N/A  Right BicepsFemS Nml Nml Nml Nml Nml Nml Nml Nml Nml Nml Nml Nml N/A  Right AdductorLong Nml Nml Nml Nml Nml Nml Nml Nml Nml Nml Nml Nml N/A  Left AntTibialis Nml Nml Nml Nml 2- Rapid Many 1+ Many 1+ Many 1+ N/A  Left Gastroc Nml Nml Nml Nml 2- Rapid Many 1+ Many 1+ Many 1+ N/A  Left RectFemoris Nml Nml Nml Nml 1- Rapid Some 1+ Some 1+ Some 1+ N/A  Left GluteusMed Nml Nml Nml Nml Nml Nml Nml Nml Nml Nml Nml Nml N/A      Waveforms:

## 2020-12-05 ENCOUNTER — Encounter: Payer: Self-pay | Admitting: Internal Medicine

## 2020-12-22 DIAGNOSIS — C4491 Basal cell carcinoma of skin, unspecified: Secondary | ICD-10-CM

## 2020-12-22 HISTORY — DX: Basal cell carcinoma of skin, unspecified: C44.91

## 2020-12-24 ENCOUNTER — Telehealth: Payer: Self-pay | Admitting: Pharmacist

## 2020-12-24 DIAGNOSIS — L821 Other seborrheic keratosis: Secondary | ICD-10-CM | POA: Diagnosis not present

## 2020-12-24 DIAGNOSIS — L820 Inflamed seborrheic keratosis: Secondary | ICD-10-CM | POA: Diagnosis not present

## 2020-12-24 DIAGNOSIS — Z85828 Personal history of other malignant neoplasm of skin: Secondary | ICD-10-CM | POA: Diagnosis not present

## 2020-12-24 DIAGNOSIS — C44319 Basal cell carcinoma of skin of other parts of face: Secondary | ICD-10-CM | POA: Diagnosis not present

## 2020-12-24 DIAGNOSIS — D485 Neoplasm of uncertain behavior of skin: Secondary | ICD-10-CM | POA: Diagnosis not present

## 2020-12-24 NOTE — Chronic Care Management (AMB) (Signed)
Chronic Care Management Pharmacy Assistant   Name: Randy Castro.  MRN: [id number] DOB: [birth year]   Reason for Encounter: Disease State-BP     Recent office visits:  None Noted  Recent consult visits:  None Noted  Hospital visits:  None in previous 6 months  Medications: Outpatient Encounter Medications as of 12/24/2020  Medication Sig  . aspirin EC 81 MG tablet Take 81 mg by mouth daily.  Marland Kitchen atorvastatin (LIPITOR) 80 MG tablet Take 1 tablet (80 mg total) by mouth at bedtime.  . Blood Glucose Monitoring Suppl (ONE TOUCH ULTRA 2) w/Device KIT by Does not apply route.  . carvedilol (COREG) 12.5 MG tablet Take 1 tablet (12.5 mg total) by mouth 2 (two) times daily with a meal.  . citalopram (CELEXA) 20 MG tablet Take 1 tablet (20 mg total) by mouth daily.  . clotrimazole-betamethasone (LOTRISONE) cream Apply topically 2 (two) times daily as needed.  . FEROSUL 325 (65 Fe) MG tablet TAKE 1 TABLET BY MOUTH DAILY  . glucose blood (ONETOUCH ULTRA) test strip Check blood sugar 2-3 times daily.  Dx Code: E11.9 (Patient taking differently: Check blood sugar 2-3 times daily.  Dx Code: E11.9)  . losartan (COZAAR) 50 MG tablet Take 1 tablet (50 mg total) by mouth at bedtime.  . magnesium oxide (MAGNESIUM-OXIDE) 400 (241.3 Mg) MG tablet Take 1 tablet (400 mg total) by mouth 2 (two) times daily.  . metFORMIN (GLUCOPHAGE) 1000 MG tablet Take 1 tablet (1,000 mg total) by mouth 2 (two) times daily with a meal.  . nitroGLYCERIN (NITROSTAT) 0.4 MG SL tablet Place 1 tablet (0.4 mg total) under the tongue every 5 (five) minutes as needed for chest pain.  Marland Kitchen NOVOLIN 70/30 FLEXPEN (70-30) 100 UNIT/ML KwikPen Inject 18 Units into the skin in the morning and at bedtime.  . pantoprazole (PROTONIX) 40 MG tablet Take 1 tablet (40 mg total) by mouth 2 (two) times daily before a meal.  . Potassium 99 MG TABS Take 1 tablet by mouth daily.  . primidone (MYSOLINE) 50 MG tablet TAKE THREE TABLETS BY MOUTH EVERY  MORNING and TAKE TWO TABLETS BY MOUTH EVERY EVENING  . vitamin B-12 (CYANOCOBALAMIN) 1000 MCG tablet Take 1,000 mcg by mouth daily.   No facility-administered encounter medications on file as of 12/24/2020.   Reviewed chart prior to disease state call. Spoke with patient regarding BP  Recent Office Vitals: BP Readings from Last 3 Encounters:  11/06/20 (!) 138/56  10/22/20 (!) 150/74  10/03/20 (!) 162/80   Pulse Readings from Last 3 Encounters:  11/06/20 75  10/22/20 68  10/03/20 60    Wt Readings from Last 3 Encounters:  11/06/20 214 lb (97.1 kg)  10/22/20 219 lb (99.3 kg)  10/03/20 221 lb (100.2 kg)     Kidney Function Lab Results  Component Value Date/Time   CREATININE 0.71 10/03/2020 10:20 AM   CREATININE 0.66 04/15/2020 08:05 AM   CREATININE 1.20 06/15/2016 08:49 AM   CREATININE 0.97 05/18/2016 08:28 AM   GFR 93.88 10/03/2020 10:20 AM   GFRNONAA >60 11/06/2019 02:55 PM   GFRNONAA 85 01/02/2016 11:06 AM   GFRAA >60 11/06/2019 02:55 PM   GFRAA >89 01/02/2016 11:06 AM    BMP Latest Ref Rng & Units 10/03/2020 04/15/2020 11/30/2019  Glucose 70 - 99 mg/dL 274(H) 196(H) 174(H)  BUN 6 - 23 mg/dL 10 9 7   Creatinine 0.40 - 1.50 mg/dL 0.71 0.66 0.65  Sodium 135 - 145 mEq/L 140 138 140  Potassium 3.5 - 5.1 mEq/L 4.3 4.2 3.6  Chloride 96 - 112 mEq/L 100 101 103  CO2 19 - 32 mEq/L 32 29 30  Calcium 8.4 - 10.5 mg/dL 8.9 8.8 8.7    . Current antihypertensive regimen:   Carvedilol 12.26m twice daily   Losartan 517mdaily  . How often are you checking your Blood Pressure? 1-2x per week   . Current home BP readings: Patient states he has not been checking his blood pressures at home. Advised patient to start checking it and possibly keeping a log.  . What recent interventions/DTPs have been made by any provider to improve Blood Pressure control since last CPP Visit: None  . Any recent hospitalizations or ED visits since last visit with CPP? No   . What diet changes have been  made to improve Blood Pressure Control?  o Patient states he has started to eat more vegetables and less greasy food.Patient states he has started to use salt.  . What exercise is being done to improve your Blood Pressure Control?  o Patient states he has been working in the yard and also tries to go walking.  Adherence Review: Is the patient currently on ACE/ARB medication? Yes   Does the patient have >5 day gap between last estimated fill dates? No     Star Rating Drugs: Atorvastatin 80 mg Last filled:11/11/2020 90 DS Losartan 50 mg Last filled:11/11/2020 90DS Metformin 1000 mg Last filled: 11/11/2020 90 DS   [facility] Pharmacist Assistant [phone]

## 2020-12-30 DIAGNOSIS — C4491 Basal cell carcinoma of skin, unspecified: Secondary | ICD-10-CM | POA: Insufficient documentation

## 2021-01-06 NOTE — Progress Notes (Signed)
Name: Randy Castro.  Age/ Sex: 70 y.o., male   MRN/ DOB: [id number], 08-Jan-1951     PCP: Colon Branch, MD   Reason for Endocrinology Evaluation: Type 2 Diabetes Mellitus  Initial Endocrine Consultative Visit: 04/30/2020    PATIENT IDENTIFIER: Randy Castro. is a 70 y.o. male with a past medical history of T2DM, CAD, Dyslipidemia and HTN. The patient has followed with Endocrinology clinic since 04/30/2020 for consultative assistance with management of his diabetes.  DIABETIC HISTORY:  Randy Castro was diagnosed with DM in 1998 , he has been on Janumet,  Glimepiride and Trulicity with reported N/V with Trulicity.Was put on insulin in 2012 following admission for NSTEMI . His hemoglobin A1c has ranged from 7.3% in 2013, peaking at 13.4% in 2019   Pt was established with Dr. Cruzita Lederer from 2013 to 2014   On his initial visit to our clinic his A1c was 9.7 % . We continued Metformin and changed NPH insulin to insulin Mix   SUBJECTIVE:   During the last visit (09/03/2020): A1c 8.7 % . We increased insulin mix and continued metformin       Today (01/07/2021): Randy Castro is here for a follow up on diabetes.  He checks his blood sugars occasionally. The patient has had hypoglycemic episodes since the last clinic visit, he did not bring his meter today.  He is symptoms with these episodes   Denies nausea or vomiting  He admits to eating cookies, candy and ice cream    HOME DIABETES REGIMEN:   Metformin 1000 mg BID   Novolin Mix 18 units BID     Statin: yes ACE-I/ARB: yes    METER DOWNLOAD SUMMARY: Did not bring    DIABETIC COMPLICATIONS: Microvascular complications:    Denies: CKD, retinopathy , neuropathy   Last Eye Exam: Completed 2021  Macrovascular complications:   CAD (S/P PCI )   Denies: CVA, PVD   HISTORY:  Past Medical History:  Past Medical History:  Diagnosis Date  . Anxiety   . Arthritis   . BCC (basal cell carcinoma of skin) 12/2020  . CAD  (coronary artery disease)    a. NSTEMI 12/12: EF 40-45%. VCB:SWHQPRF balloon PTCA + Promus DES x 1 to mid LAD;  b. 11/2012: Cath with Cutting balloon PTCA  LAD for ISR to LAD, EF 55%;  c. 12/2012 Cath/PCI: LAD stents patent, 80 ost Diag (jailed)->PTCA, LCX/RCA patent.  . Essential hypertension   . GI bleed   . History of blood transfusion    was getting 4 units  . Hyperlipidemia   . Ischemic cardiomyopathy    a.  echo 08/06/11: dist ant wall, apical and septal and infero-apical HK, mild LVH, EF 40%, mild LAE, PASP 34, asc aorta mildly dilated, mild TR;  b.  Echo (3/13) showed recovery of LV systolic function with EF 16-38%, grade I diastolic dysfunction, mild MR.   . Myocardial infarction ([place])    twice with NSTEMI 2012  . Palpitations   . Type 2 diabetes mellitus ([name])    Past Surgical History:  Past Surgical History:  Procedure Laterality Date  . ANTERIOR CERVICAL DECOMP/DISCECTOMY FUSION  in the 90s  . CHOLECYSTECTOMY  03/27/2019  . COLONOSCOPY  2021  . COLONOSCOPY WITH ESOPHAGOGASTRODUODENOSCOPY (EGD)  10/2019  . CORONARY ANGIOPLASTY  12/15/2012; 12/22/2012  . CORONARY ANGIOPLASTY WITH STENT PLACEMENT  07/2011   "1" (12/22/2012)  . GASTRIC BYPASS  12/21/2017  . KNEE ARTHROSCOPY Left 12/31/2016  .  LEFT HEART CATHETERIZATION WITH CORONARY ANGIOGRAM N/A 08/06/2011   Procedure: LEFT HEART CATHETERIZATION WITH CORONARY ANGIOGRAM;  Surgeon: Burnell Blanks, MD;  Location: [facility] CATH LAB;  Service: Cardiovascular;  Laterality: N/A;  . LEFT HEART CATHETERIZATION WITH CORONARY ANGIOGRAM N/A 12/15/2012   Procedure: LEFT HEART CATHETERIZATION WITH CORONARY ANGIOGRAM;  Surgeon: Sherren Mocha, MD;  Location: [facility] CATH LAB;  Service: Cardiovascular;  Laterality: N/A;  . LEFT HEART CATHETERIZATION WITH CORONARY ANGIOGRAM N/A 12/22/2012   Procedure: LEFT HEART CATHETERIZATION WITH CORONARY ANGIOGRAM;  Surgeon: Sherren Mocha, MD;  Location: [facility] CATH LAB;  Service: Cardiovascular;  Laterality: N/A;  .  PERCUTANEOUS CORONARY STENT INTERVENTION (PCI-S) Right 12/15/2012   Procedure: PERCUTANEOUS CORONARY STENT INTERVENTION (PCI-S);  Surgeon: Sherren Mocha, MD;  Location: [facility] CATH LAB;  Service: Cardiovascular;  Laterality: Right;    Social History:  reports that he quit smoking about 28 years ago. His smoking use included cigarettes. He has a 25.00 pack-year smoking history. He has never used smokeless tobacco. He reports current alcohol use. He reports that he does not use drugs. Family History:  Family History  Problem Relation Age of Onset  . Diabetes Brother   . Lung cancer Brother   . Diabetes Mother   . Coronary artery disease Father   . Heart attack Father 16  . Diabetes Sister   . Coronary artery disease Sister   . Colon cancer Neg Hx   . Prostate cancer Neg Hx   . Stroke Neg Hx   . Esophageal cancer Neg Hx   . Colon polyps Neg Hx   . Rectal cancer Neg Hx   . Stomach cancer Neg Hx      HOME MEDICATIONS: Allergies as of 01/07/2021      Reactions   Trulicity [dulaglutide] Nausea And Vomiting      Medication List       Accurate as of Jan 07, 2021  9:34 AM. If you have any questions, ask your nurse or doctor.        aspirin EC 81 MG tablet Take 81 mg by mouth daily.   atorvastatin 80 MG tablet Commonly known as: LIPITOR Take 1 tablet (80 mg total) by mouth at bedtime.   carvedilol 12.5 MG tablet Commonly known as: COREG Take 1 tablet (12.5 mg total) by mouth 2 (two) times daily with a meal.   citalopram 20 MG tablet Commonly known as: CELEXA Take 1 tablet (20 mg total) by mouth daily.   clotrimazole-betamethasone cream Commonly known as: LOTRISONE Apply topically 2 (two) times daily as needed.   Dexcom G6 Receiver Devi 1 Device by Does not apply route as directed. Started by: Dorita Sciara, MD   Dexcom G6 Sensor Misc 1 Device by Does not apply route as directed. Started by: Dorita Sciara, MD   Dexcom G6 Transmitter Misc 1 Device by Does  not apply route as directed. Started by: Dorita Sciara, MD   FeroSul 325 (65 FE) MG tablet Generic drug: ferrous sulfate TAKE 1 TABLET BY MOUTH DAILY   losartan 50 MG tablet Commonly known as: COZAAR Take 1 tablet (50 mg total) by mouth at bedtime.   magnesium oxide 400 (241.3 Mg) MG tablet Commonly known as: MAGnesium-Oxide Take 1 tablet (400 mg total) by mouth 2 (two) times daily.   metFORMIN 1000 MG tablet Commonly known as: GLUCOPHAGE Take 1 tablet (1,000 mg total) by mouth 2 (two) times daily with a meal.   nitroGLYCERIN 0.4 MG SL tablet Commonly known as: NITROSTAT  Place 1 tablet (0.4 mg total) under the tongue every 5 (five) minutes as needed for chest pain.   NovoLIN 70/30 FlexPen (70-30) 100 UNIT/ML KwikPen Generic drug: insulin isophane & regular human Inject 18 Units into the skin in the morning and at bedtime.   ONE TOUCH ULTRA 2 w/Device Kit by Does not apply route.   OneTouch Ultra test strip Generic drug: glucose blood Check blood sugar 2-3 times daily.  Dx Code: E11.9   pantoprazole 40 MG tablet Commonly known as: PROTONIX Take 1 tablet (40 mg total) by mouth 2 (two) times daily before a meal.   Potassium 99 MG Tabs Take 1 tablet by mouth daily.   primidone 50 MG tablet Commonly known as: MYSOLINE TAKE THREE TABLETS BY MOUTH EVERY MORNING and TAKE TWO TABLETS BY MOUTH EVERY EVENING   vitamin B-12 1000 MCG tablet Commonly known as: CYANOCOBALAMIN Take 1,000 mcg by mouth daily.        OBJECTIVE:   Vital Signs: BP (!) 146/88   Pulse 68   Ht 6' 2"  (1.88 m)   Wt 220 lb 6 oz (100 kg)   SpO2 98%   BMI 28.29 kg/m   Wt Readings from Last 3 Encounters:  01/07/21 220 lb 6 oz (100 kg)  11/06/20 214 lb (97.1 kg)  10/22/20 219 lb (99.3 kg)     Exam: General: Pt appears well and is in NAD  Lungs: Clear with good BS bilat with no rales, rhonchi, or wheezes  Heart: RRR   Extremities: No pretibial edema   Neuro: MS is good with  appropriate affect, pt is alert and Ox3       DM foot exam: 04/30/2020  The skin of the feet is intact without sores or ulcerations. The pedal pulses are 2+ on right and 2+ on left. The sensation is decreased to a screening 5.07, 10 gram monofilament bilaterally  DATA REVIEWED:  Lab Results  Component Value Date   HGBA1C 8.4 (A) 01/07/2021   HGBA1C 8.7 (A) 09/03/2020   HGBA1C 9.7 (H) 04/01/2020   Lab Results  Component Value Date   MICROALBUR 1.72 08/05/2012   LDLCALC 24 04/01/2020   CREATININE 0.71 10/03/2020   Lab Results  Component Value Date   MICRALBCREAT 11.8 08/05/2012     Lab Results  Component Value Date   CHOL 78 04/01/2020   HDL 32.80 (L) 04/01/2020   LDLCALC 24 04/01/2020   LDLDIRECT 145.0 04/26/2018   TRIG 106.0 04/01/2020   CHOLHDL 2 04/01/2020       Results for TAYSHAUN, KROH (MRN [id number]) as of 01/07/2021 14:17  Ref. Range 01/07/2021 09:36  Creatinine,U Latest Units: mg/dL 108.4  Microalb, Ur Latest Ref Range: 0.0 - 1.9 mg/dL 72.9 (H)  MICROALB/CREAT RATIO Latest Ref Range: 0.0 - 30.0 mg/g 67.3 (H)   ASSESSMENT / PLAN / RECOMMENDATIONS:   1) Type 2 Diabetes Mellitus, With improving glucose control, With neuropathic, and macrovascular  complications and microalbuminuria  - Most recent A1c of 8.4 %. Goal A1c < 7.0 %.     - Pt admits to dietary indiscretions with eating candy, cookies and candy bars, he recently changed his diet by eliminating candy and unfortunately is having hypoglycemia overnight.  - He is on vials, as he believes this is cheaper - I will reduce his evening dose of insulin - Dexcom prescribed   MEDICATIONS: - Continue Metformin 1000 mg, 1 tablet with Breakfast and Supper - Change  Novolin Mix 70/30 18 units before  breakfast and 14 units before  Supper   EDUCATION / INSTRUCTIONS:  BG monitoring instructions: Patient is instructed to check his blood sugars 2 times a day, before breakfast and Supper .  Call [facility]  Endocrinology clinic if: BG persistently < 70 . I reviewed the Rule of 15 for the treatment of hypoglycemia in detail with the patient. Literature supplied.     2) Diabetic complications:   Eye: Does not have known diabetic retinopathy.   Neuro/ Feet: Does  have known diabetic peripheral neuropathy .   Renal: Patient does not have known baseline CKD, but has microalbuminuria. He   is       on an ACEI/ARB at present.      F/U in 6 months    Signed electronically by: Mack Guise, MD  [facility]  [address] [place], [place] [address] Phone: [phone] FAX: [phone]   CC: Colon Branch, [address] [place] [address] Phone: [phone]  Fax: [phone]  Return to Endocrinology clinic as below: Future Appointments  Date Time Provider [place]  02/25/2021  9:00 AM [facility] PHARMACIST [facility]  04/02/2021  9:20 AM Colon Branch, MD [facility]  04/24/2021 10:45 AM [name], [name], DO [facility] None  05/12/2021 11:20 AM Satira Sark, MD [facility]

## 2021-01-07 ENCOUNTER — Other Ambulatory Visit: Payer: Self-pay

## 2021-01-07 ENCOUNTER — Encounter: Payer: Self-pay | Admitting: Internal Medicine

## 2021-01-07 ENCOUNTER — Ambulatory Visit: Payer: PPO | Admitting: Internal Medicine

## 2021-01-07 VITALS — BP 146/88 | HR 68 | Ht 74.0 in | Wt 220.4 lb

## 2021-01-07 DIAGNOSIS — E1159 Type 2 diabetes mellitus with other circulatory complications: Secondary | ICD-10-CM | POA: Diagnosis not present

## 2021-01-07 DIAGNOSIS — E1142 Type 2 diabetes mellitus with diabetic polyneuropathy: Secondary | ICD-10-CM | POA: Diagnosis not present

## 2021-01-07 DIAGNOSIS — Z794 Long term (current) use of insulin: Secondary | ICD-10-CM | POA: Diagnosis not present

## 2021-01-07 DIAGNOSIS — E1165 Type 2 diabetes mellitus with hyperglycemia: Secondary | ICD-10-CM | POA: Diagnosis not present

## 2021-01-07 LAB — POCT GLYCOSYLATED HEMOGLOBIN (HGB A1C): Hemoglobin A1C: 8.4 % — AB (ref 4.0–5.6)

## 2021-01-07 LAB — MICROALBUMIN / CREATININE URINE RATIO
Creatinine,U: 108.4 mg/dL
Microalb Creat Ratio: 67.3 mg/g — ABNORMAL HIGH (ref 0.0–30.0)
Microalb, Ur: 72.9 mg/dL — ABNORMAL HIGH (ref 0.0–1.9)

## 2021-01-07 LAB — POCT GLUCOSE (DEVICE FOR HOME USE): POC Glucose: 154 mg/dl — AB (ref 70–99)

## 2021-01-07 MED ORDER — DEXCOM G6 SENSOR MISC: 1.0000 | 3 refills | Status: DC

## 2021-01-07 MED ORDER — DEXCOM G6 TRANSMITTER MISC: 1.0000 | 3 refills | Status: DC

## 2021-01-07 MED ORDER — DEXCOM G6 RECEIVER DEVI: 1.0000 | 0 refills | Status: DC

## 2021-01-07 NOTE — Patient Instructions (Addendum)
-   Continue Metformin 1000 mg, 1 tablet with Breakfast and Supper - Change Novolin Mix 70/30  18 units before Breakfast and 14 units before Supper        HOW TO TREAT LOW BLOOD SUGARS (Blood sugar LESS THAN 70 MG/DL)  Please follow the RULE OF 15 for the treatment of hypoglycemia treatment (when your (blood sugars are less than 70 mg/dL)    STEP 1: Take 15 grams of carbohydrates when your blood sugar is low, which includes:   3-4 GLUCOSE TABS  OR  3-4 OZ OF JUICE OR REGULAR SODA OR  ONE TUBE OF GLUCOSE GEL     STEP 2: RECHECK blood sugar in 15 MINUTES STEP 3: If your blood sugar is still low at the 15 minute recheck --> then, go back to STEP 1 and treat AGAIN with another 15 grams of carbohydrates.

## 2021-01-15 DIAGNOSIS — Z85828 Personal history of other malignant neoplasm of skin: Secondary | ICD-10-CM | POA: Diagnosis not present

## 2021-01-15 DIAGNOSIS — C44319 Basal cell carcinoma of skin of other parts of face: Secondary | ICD-10-CM | POA: Diagnosis not present

## 2021-01-29 ENCOUNTER — Telehealth: Payer: Self-pay

## 2021-01-29 NOTE — Chronic Care Management (AMB) (Signed)
Chronic Care Management Pharmacy Assistant   Name: Tramell Piechota.  MRN: [id number] DOB: 10-09-1950   Reason for Encounter: Disease State Diabetes Mellitus    Recent office visits:  None noted  Recent consult visits:  01/07/21 [facility] (Endocrinology)- Diabetes follow up.Reduce evening dose of insulin to 14 units.  Hospital visits:  None in previous 6 months  Medications: Outpatient Encounter Medications as of 01/29/2021  Medication Sig  . aspirin EC 81 MG tablet Take 81 mg by mouth daily.  Marland Kitchen atorvastatin (LIPITOR) 80 MG tablet Take 1 tablet (80 mg total) by mouth at bedtime.  . Blood Glucose Monitoring Suppl (ONE TOUCH ULTRA 2) w/Device KIT by Does not apply route.  . carvedilol (COREG) 12.5 MG tablet Take 1 tablet (12.5 mg total) by mouth 2 (two) times daily with a meal.  . citalopram (CELEXA) 20 MG tablet Take 1 tablet (20 mg total) by mouth daily.  . clotrimazole-betamethasone (LOTRISONE) cream Apply topically 2 (two) times daily as needed.  . Continuous Blood Gluc Receiver (DEXCOM G6 RECEIVER) DEVI 1 Device by Does not apply route as directed.  . Continuous Blood Gluc Sensor (DEXCOM G6 SENSOR) MISC 1 Device by Does not apply route as directed.  . Continuous Blood Gluc Transmit (DEXCOM G6 TRANSMITTER) MISC 1 Device by Does not apply route as directed.  . FEROSUL 325 (65 Fe) MG tablet TAKE 1 TABLET BY MOUTH DAILY  . glucose blood (ONETOUCH ULTRA) test strip Check blood sugar 2-3 times daily.  Dx Code: E11.9 (Patient taking differently: Check blood sugar 2-3 times daily.  Dx Code: E11.9)  . losartan (COZAAR) 50 MG tablet Take 1 tablet (50 mg total) by mouth at bedtime.  . magnesium oxide (MAGNESIUM-OXIDE) 400 (241.3 Mg) MG tablet Take 1 tablet (400 mg total) by mouth 2 (two) times daily.  . metFORMIN (GLUCOPHAGE) 1000 MG tablet Take 1 tablet (1,000 mg total) by mouth 2 (two) times daily with a meal.  . nitroGLYCERIN (NITROSTAT) 0.4 MG SL tablet Place 1 tablet (0.4 mg  total) under the tongue every 5 (five) minutes as needed for chest pain.  Marland Kitchen NOVOLIN 70/30 FLEXPEN (70-30) 100 UNIT/ML KwikPen Inject 18 Units into the skin in the morning and at bedtime.  . pantoprazole (PROTONIX) 40 MG tablet Take 1 tablet (40 mg total) by mouth 2 (two) times daily before a meal.  . Potassium 99 MG TABS Take 1 tablet by mouth daily.  . primidone (MYSOLINE) 50 MG tablet TAKE THREE TABLETS BY MOUTH EVERY MORNING and TAKE TWO TABLETS BY MOUTH EVERY EVENING  . vitamin B-12 (CYANOCOBALAMIN) 1000 MCG tablet Take 1,000 mcg by mouth daily.   No facility-administered encounter medications on file as of 01/29/2021.   Recent Relevant Labs: Lab Results  Component Value Date/Time   HGBA1C 8.4 (A) 01/07/2021 09:17 AM   HGBA1C 8.7 (A) 09/03/2020 11:26 AM   HGBA1C 9.7 (H) 04/01/2020 09:10 AM   HGBA1C 7.7 09/12/2019 12:00 AM   HGBA1C 8.2 04/20/2019 12:00 AM   MICROALBUR 72.9 (H) 01/07/2021 09:36 AM   MICROALBUR 1.72 08/05/2012 03:50 PM    Kidney Function Lab Results  Component Value Date/Time   CREATININE 0.71 10/03/2020 10:20 AM   CREATININE 0.66 04/15/2020 08:05 AM   CREATININE 1.20 06/15/2016 08:49 AM   CREATININE 0.97 05/18/2016 08:28 AM   GFR 93.88 10/03/2020 10:20 AM   GFRNONAA >60 11/06/2019 02:55 PM   GFRNONAA 85 01/02/2016 11:06 AM   GFRAA >60 11/06/2019 02:55 PM   GFRAA >  89 01/02/2016 11:06 AM    . Current antihyperglycemic regimen:  o Metformin 1000 mg Take 1 tablet twice daily o Novolin 70/30 inject 18 units in the morning and 14 units at bedtime  . What recent interventions/DTPs have been made to improve glycemic control:  o Novolin 70/30 decreased to 14 units at bedtime  . Have there been any recent hospitalizations or ED visits since last visit with CPP? No   . Patient reports hypoglycemic symptoms, including Shaky and weak   . Patient reports hyperglycemic symptoms, including skakiness   . How often are you checking your blood sugar? 1-2 times per  week  . What are your blood sugars ranging?  o Fasting: 190 o Before meals:  o After meals:  o Bedtime:   . During the week, how often does your blood glucose drop below 70? occasionally  . Are you checking your feet daily/regularly?  o Patients wife states he checks his feet daily.  Adherence Review: Is the patient currently on a STATIN medication? Yes Is the patient currently on ACE/ARB medication? Yes Does the patient have >5 day gap between last estimated fill dates? Yes   Patient states he is still waiting on approval for the [facility].    Star Rating Drugs: Atorvastatin 80 mg last filled 11/11/20 90 DS Losartan 50 mg last filled 11/11/20 90 DS Metformin 1000 mg last filled 11/11/20 90 DS   [facility] Clinical Pharmacist Assistant [phone]

## 2021-01-30 ENCOUNTER — Other Ambulatory Visit: Payer: Self-pay | Admitting: Internal Medicine

## 2021-02-25 ENCOUNTER — Ambulatory Visit (INDEPENDENT_AMBULATORY_CARE_PROVIDER_SITE_OTHER): Payer: PPO | Admitting: Pharmacist

## 2021-02-25 DIAGNOSIS — E1165 Type 2 diabetes mellitus with hyperglycemia: Secondary | ICD-10-CM | POA: Diagnosis not present

## 2021-02-25 DIAGNOSIS — F32A Depression, unspecified: Secondary | ICD-10-CM

## 2021-02-25 DIAGNOSIS — E1159 Type 2 diabetes mellitus with other circulatory complications: Secondary | ICD-10-CM

## 2021-02-25 DIAGNOSIS — I10 Essential (primary) hypertension: Secondary | ICD-10-CM

## 2021-02-25 DIAGNOSIS — F419 Anxiety disorder, unspecified: Secondary | ICD-10-CM | POA: Diagnosis not present

## 2021-02-25 DIAGNOSIS — I2510 Atherosclerotic heart disease of native coronary artery without angina pectoris: Secondary | ICD-10-CM | POA: Diagnosis not present

## 2021-02-25 DIAGNOSIS — E7800 Pure hypercholesterolemia, unspecified: Secondary | ICD-10-CM | POA: Diagnosis not present

## 2021-02-25 NOTE — Chronic Care Management (AMB) (Signed)
Chronic Care Management Pharmacy Note  02/25/2021 Name:  Randy Castro. MRN:  [id number] DOB:  1951-01-01  Subjective: Randy Foots. is an 70 y.o. year old male who is a primary patient of Paz, Alda Berthold, MD.  The CCM team was consulted for assistance with disease management and care coordination needs.    Engaged with patient by telephone for follow up visit in response to provider referral for pharmacy case management and/or care coordination services.   Consent to Services:  The patient was given information about Chronic Care Management services, agreed to services, and gave verbal consent prior to initiation of services.  Please see initial visit note for detailed documentation.   Patient Care Team: Colon Branch, MD as PCP - General (Internal Medicine) Satira Sark, MD as PCP - Cardiology (Cardiology) Larey Dresser, MD as Consulting Physician (Cardiology) Lake Bells., MD as Referring Physician (Gastroenterology) Gearlean Alf, PA-C as Physician Assistant (Pulmonary Disease) Demetrius Revel, MD as Consulting Physician (Bariatrics) Younts, Javier Glazier, NP as Nurse Practitioner (Endocrinology) [name], [name], DO as Consulting Physician (Neurology) [name], PharmD (Pharmacist)  Recent office visits: 10/03/2020 - PCP (Dr Larose Kells) Seen for CPE; noted BP was elevated; No med changes but was instructed to check BP at home; Noted dizziness and fall x2; Referred to neuro for evaluation.  Recent consult visits: 01/07/2021 - Endo [facility]) F/U type 2 DM; Changed Novolin 70/30 insulin from 18 units bid to 14 units prior to breakfast and 18 units prior to supper. Dexcom also prescribed.   12/04/2020 Neuro (Dr Posey Pronto) F/U neuropathy and paresthesia. No med changes  11/06/2020 - Cardio [facility]) - CAD routine f/u. No medication changes.   10/22/2020 - Neuro (Dr [name]) - f/u essential tremor, anemia and gait instability.  Gait instability thought to be related to peripheral  neuorpathy. EMG ordered. Recommended balance therapy and use of assisted device. Pt wanted to hold off on balance therapy. Recommended improved BG control - working with Dr Kelton Pillar). Also checked B12, RPR, SPEP/UPEP with immunifixation. B12 level was in low normal range.  Recommended OTC B12 by mouth 1055mg daily.  Hospital visits: None in previous 6 months  Objective:  Lab Results  Component Value Date   CREATININE 0.71 10/03/2020   CREATININE 0.66 04/15/2020   CREATININE 0.65 11/30/2019    Lab Results  Component Value Date   HGBA1C 8.4 (A) 01/07/2021   Last diabetic Eye exam:  Lab Results  Component Value Date/Time   HMDIABEYEEXA Retinopathy (A) 07/19/2019 12:00 AM    Last diabetic Foot exam:  Lab Results  Component Value Date/Time   HMDIABFOOTEX Done 03/19/2019 12:00 AM        Component Value Date/Time   CHOL 78 04/01/2020 0910   TRIG 106.0 04/01/2020 0910   HDL 32.80 (L) 04/01/2020 0910   CHOLHDL 2 04/01/2020 0910   VLDL 21.2 04/01/2020 0910   LDLCALC 24 04/01/2020 0910   LDLDIRECT 145.0 04/26/2018 1026    Hepatic Function Latest Ref Rng & Units 10/22/2020 10/03/2020 11/30/2019  Total Protein 6.1 - 8.1 g/dL 5.9(L) 6.1 5.7(L)  Albumin 3.5 - 5.2 g/dL - 4.0 3.9  AST 0 - 37 U/L - 37 33  ALT 0 - 53 U/L - 47 42  Alk Phosphatase 39 - 117 U/L - 69 80  Total Bilirubin 0.2 - 1.2 mg/dL - 0.5 0.5  Bilirubin, Direct <=0.2 mg/dL - - -    Lab Results  Component Value Date/Time  TSH 0.92 04/26/2018 10:26 AM   TSH 0.73 01/02/2016 11:06 AM    CBC Latest Ref Rng & Units 10/03/2020 04/01/2020 11/30/2019  WBC 4.0 - 10.5 K/uL 6.0 6.5 6.2  Hemoglobin 13.0 - 17.0 g/dL 12.6(L) 11.8(L) 10.8(L)  Hematocrit 39.0 - 52.0 % 37.9(L) 35.1(L) 31.9(L)  Platelets 150.0 - 400.0 K/uL 226.0 246.0 303.0    No results found for: VD25OH  Clinical ASCVD: Yes  The ASCVD Risk score Mikey Bussing DC Jr., et al., 2013) failed to calculate for the following reasons:   The patient has a prior MI or stroke  diagnosis     Social History   Tobacco Use  Smoking Status Former   Packs/day: 1.00   Years: 25.00   Pack years: 25.00   Types: Cigarettes   Quit date: 04/12/1992   Years since quitting: 28.8  Smokeless Tobacco Never   BP Readings from Last 3 Encounters:  01/07/21 (!) 146/88  11/06/20 (!) 138/56  10/22/20 (!) 150/74   Pulse Readings from Last 3 Encounters:  01/07/21 68  11/06/20 75  10/22/20 68   Wt Readings from Last 3 Encounters:  01/07/21 220 lb 6 oz (100 kg)  11/06/20 214 lb (97.1 kg)  10/22/20 219 lb (99.3 kg)    Assessment: Review of patient past medical history, allergies, medications, health status, including review of consultants reports, laboratory and other test data, was performed as part of comprehensive evaluation and provision of chronic care management services.   SDOH:  (Social Determinants of Health) assessments and interventions performed:  SDOH Interventions    Flowsheet Row Most Recent Value  SDOH Interventions   Financial Strain Interventions Intervention Not Indicated  Housing Interventions Intervention Not Indicated       CCM Care Plan  Allergies  Allergen Reactions   Trulicity [Dulaglutide] Nausea And Vomiting    Medications Reviewed Today     Reviewed by [name], PharmD (Pharmacist) on 02/25/21 at [phone]  Med List Status: <None>   Medication Order Taking? Sig Documenting Provider Last Dose Status Informant  aspirin EC 81 MG tablet [id number] Yes Take 81 mg by mouth daily. [provider] Taking Active   atorvastatin (LIPITOR) 80 MG tablet [id number] Yes Take 1 tablet (80 mg total) by mouth at bedtime. Colon Branch, MD Taking Active   Blood Glucose Monitoring Suppl (ONE TOUCH ULTRA 2) w/Device KIT [id number] Yes by Does not apply route. [provider] Taking Active   carvedilol (COREG) 12.5 MG tablet [id number] Yes Take 1 tablet (12.5 mg total) by mouth 2 (two) times daily with a meal. Colon Branch, MD Taking Active    citalopram (CELEXA) 20 MG tablet [id number] Yes Take 1 tablet (20 mg total) by mouth every morning. Colon Branch, MD Taking Active   clotrimazole-betamethasone Donalynn Furlong) cream [id number] Yes Apply topically 2 (two) times daily as needed. Colon Branch, MD Taking Active   Continuous Blood Gluc Receiver ([place]) [place] [id number] Yes 1 Device by Does not apply route as directed. Shamleffer, Melanie Crazier, MD Taking Active   Continuous Blood Gluc Sensor (DEXCOM G6 SENSOR) MISC [id number] No 1 Device by Does not apply route as directed.  Patient not taking: Reported on 02/25/2021   Shamleffer, Melanie Crazier, MD Not Taking Active            Med Note Antony Contras, [place] B   Tue Feb 25, 2021  9:20 AM) Denied by insurance   Continuous Blood Gluc Transmit (DEXCOM G6 TRANSMITTER) MISC [id number] No 1 Device  by Does not apply route as directed.  Patient not taking: Reported on 02/25/2021   Shamleffer, Melanie Crazier, MD Not Taking Active   FEROSUL 325 (65 Fe) MG tablet [id number] Yes TAKE 1 TABLET BY MOUTH DAILY Cirigliano, Vito V, DO Taking Active   glucose blood (ONETOUCH ULTRA) test strip [id number] Yes Check blood sugar 2-3 times daily.  Dx Code: E11.9  Patient taking differently: Check blood sugar 2-3 times daily.  Dx Code: E11.9   Colon Branch, MD Taking Active   losartan (COZAAR) 50 MG tablet [id number] Yes Take 1 tablet (50 mg total) by mouth at bedtime. Colon Branch, MD Taking Active   magnesium oxide (MAGNESIUM-OXIDE) 400 (241.3 Mg) MG tablet [id number] Yes Take 1 tablet (400 mg total) by mouth 2 (two) times daily. Colon Branch, MD Taking Active   metFORMIN (GLUCOPHAGE) 1000 MG tablet [id number] Yes Take 1 tablet (1,000 mg total) by mouth 2 (two) times daily with a meal. Colon Branch, MD Taking Active   nitroGLYCERIN (NITROSTAT) 0.4 MG SL tablet [id number] Yes Place 1 tablet (0.4 mg total) under the tongue every 5 (five) minutes as needed for chest pain. Satira Sark, MD Taking Active            Med  Note [facility], [facility] B   Mon Nov 25, 2020  9:11 AM)    NOVOLIN 70/30 FLEXPEN (70-30) 100 UNIT/ML KwikPen [id number] Yes Inject 18 Units into the skin in the morning and at bedtime. [provider] Taking Active            Med Note Antony Contras, Alem Fahl B   Tue Feb 25, 2021  9:21 AM) Taking 18 units with morning meal and 14 units with evening meal.   pantoprazole (PROTONIX) 40 MG tablet [id number] Yes Take 1 tablet (40 mg total) by mouth 2 (two) times daily before a meal. Colon Branch, MD Taking Active   Potassium 99 MG TABS [id number] Yes Take 1 tablet by mouth daily. [provider] Taking Active Spouse/Significant Other  primidone (MYSOLINE) 50 MG tablet [id number] Yes TAKE THREE TABLETS BY MOUTH EVERY MORNING and TAKE TWO TABLETS BY MOUTH EVERY EVENING [name], [name], DO Taking Active   vitamin B-12 (CYANOCOBALAMIN) 1000 MCG tablet [id number] Yes Take 1,000 mcg by mouth daily. 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   Abdominal pain 11/17/2018   Cardiac arrhythmia, unspecified 11/14/2017   Hypomagnesemia 11/14/2017   Anemia 11/14/2017   Bleeding skin mole 11/14/2017   OSA on CPAP 09/21/2017   Obesity 01/23/2016   PCP NOTES >>> 06/08/2015   Anxiety and depression 05/31/2013   Intermediate coronary syndrome ([place]) 12/22/2012   Unstable angina ([place]) 12/16/2012   Fatigue 04/26/2012   Annual physical exam 10/28/2011   Rash 10/28/2011    Coronary Artery Disease 08/28/2011   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   Palpitations 08/28/2011   NSTEMI (non-ST elevated myocardial infarction) ([place]) 08/07/2011  DJD (degenerative joint disease) 05/28/2011   Poorly controlled type 2 diabetes mellitus with circulatory disorder (HCC)    Hyperlipidemia    HTN (hypertension)     Immunization History  Administered Date(s) Administered   Fluad Quad(high Dose 65+) 05/04/2019   Influenza Split 06/30/2011   Influenza, High Dose Seasonal PF 05/31/2013, 05/27/2017, 06/07/2018   Influenza, Seasonal, Injecte, Preservative Fre 07/28/2012   Influenza,inj,Quad PF,6+ Mos 05/29/2014, 06/07/2015, 04/23/2016   Influenza-Unspecified 05/24/2020   PFIZER(Purple Top)SARS-COV-2 Vaccination 10/11/2019, 11/01/2019, 05/20/2020   Pneumococcal Conjugate-13 12/05/2014   Pneumococcal Polysaccharide-23 08/07/2011, 10/12/2018   Tdap 05/31/2013   Zoster, Live 11/28/2012    Conditions to be addressed/monitored: CAD, HTN, HLD, DMII, Anxiety, Depression and B12 deficiency; iron def anemia; ;essential tremor; peripheral neuropathy; hypomagnesemia; h/o gastric bypass surgery; h/o lower GI bleed and GERD  Care Plan : General Pharmacy (Adult)  Updates made by [name], PHARMD since 02/25/2021 12:00 AM     Problem: Management of Chronic Conditions: Hypertension, Hyperlipidemia, Heart Disease, Diabetes, Depression, GERD/History of GI Bleed, Essential Tremor, Hypomagnesemia, neuropathy   Priority: High  Onset Date: 11/25/2020  Note:   Current Barriers:  Unable to achieve control of type 2 DM and HTN  Not filling medications on time despite using [facility] packaging system and delivery  Pharmacist Clinical Goal(s):  Over the next 90 days, patient will achieve adherence to monitoring guidelines and medication adherence to achieve therapeutic efficacy achieve control of diabetes and HTN as evidenced by A1c <8.0 and BP <140/90  through collaboration with PharmD and  provider.   Interventions: 1:1 collaboration with Colon Branch, MD regarding development and update of comprehensive plan of care as evidenced by provider attestation and co-signature Inter-disciplinary care team collaboration (see longitudinal plan of care) Comprehensive medication review performed; medication list updated in electronic medical record  Diabetes: Managed by endo - Dr Kelton Pillar Uncontrolled  Current treatment:  Novolin 70/30 18 units with morning meal and 14 units with evening meal Metformin 1074m twice daily  Has tried Trulicity in past - caused nausea (also has h/o gastric bypass surgery and lower GI bleed). Tried jardiance and invokana - Jadiance stopped due to cost and Invokana was non formulary; Lantus taken in past but stopped due to cost.  Current glucose readings: Patient states he has not been checking BG regularly.  Was prescribed DEXCom CGM per Dr SKelton Pillarbut patient reports his insuarnce declined because he was not injecting insulin 3 or more times per day.  Reports hypoglycemia has improved since last insulin dose change on 01/07/2021 Has been eating fewer high sugar snacks.  Current exercise: walking some, renovating home, yardwork Interventions:  Reviewed home blood glucose readings and reviewed goals  Fasting blood glucose goal (before meals) = 80 to 130 Blood glucose goal after a meal = less than 180  Reminded patient to check BG 2 to 3 times per day Consider SGLT2 in future, though cost has been issue in past. He might qualify for FIranpatient assistance.   Hypertension: Uncontrolled but improving. Current treatment: Carvedilol 12.575mtwice daily Losartan 5075maily Current home readings: patient has not started checking at home as recommended by PCP Interventions:  Counseled on checking BP at home to get better idea of BP outside of office.  Recommended If BP continues to be above goal; consider increaseing losartan to 100m73mily or add  HCTZ to regimen Coordinated with pharmacy to see if could determine reason for not needing refills on all meds in June. No reason identified. Discussed  adherence with patient and wife.   Hyperlipidemia / CAD: Controlled;  Current treatment:  Aspirin 49m daily Atorvastatin 89mdaily Nitroglycerin 0.82m17ms needed Interventions:  Recommended continue current therapy Discussed adherence to maintenance medications - per refill history it looks like patient might have received double fill of atorvastatin in October 2021 - [facility] 90 DS [facility] on 06/11/2020 from [facility] Will continue to follow adherence and intervene as needed.   Depression/Anxiety/: Controlled Current treatment: citalopram 73m63mily   Interventions (addressed at previous visits) Recommended continue current therapy  Neuropathy Controlled Current regimen:  OTC B12 1000mc98mily  Interventions: Consider repeat B12 in 3 to 6 months (June to September 2022 - has appt with PCP August 2022)  Discussed that he should consider balance therapy as recommended by Dr [name] (Carles Colletro)  Hypomagnesemia Current regimen:  Magnesium oxide 400mg 36me daily Interventions: Consider repeat Magnesium level at next office visit (august  2022)  Health Maintenance  Interventions: Recommended patient complete Shingrix vaccine series at pharmacy. Reminded he can get at either local pharmacy or pharmacy in our office when he sees endo [place].  Discussed diet and exercise Continue to be active daily either walking, yardwork Limit snacking and serving sizes.   Medication management Current pharmacy: [facility] Discussed adherence with patient. His last packaging / fills were 11/11/2020 for 90 DS - he is past due but per patient he has medication packages on hand.  Interventions Comprehensive medication review performed. Utilize [facility] pharmacy for medication synchronization,  packaging and delivery Reviewed adherence and contacted pharmacy to verify fills. Discussed adherence with patient - he states he is taking meds daily.   Patient Goals/Self-Care Activities Over the next 90 days, patient will:  take medications as prescribed, check glucose 2 to 3 times a day, document, and provide at future appointments,  check blood pressure 1 to 2 times per week, document, and provide at future appointments engage in dietary modifications by decreasing intake of high sugar / CHO snacks and limiting serving sizes  Follow Up Plan: Telephone follow up appointment with care management team member scheduled for:  3 months       Medication Assistance: None required.  Patient affirms current coverage meets needs.  Patient's preferred pharmacy is:  [facility] - [place] [address] [place] [address]: [phone] [address] [place]-[address]: [phone] [facility] (New Address) - [address]: [address] [place]-[address]: [phone] pill box? No - using packaging from [facility] Adherence is about 75 to 80% - working with patient and his wife to increase adherence.    Follow Up:  Patient agrees to Care Plan and Follow-up.  Plan: Telephone follow up appointment with care management team member scheduled for:  [facility]; 3 months with clinical pharmacist.   Kathlynn Swofford Cherre RobinsmD Clinical Pharmacist [phone]

## 2021-02-25 NOTE — Patient Instructions (Addendum)
Visit Information  PATIENT GOALS:  Goals Addressed             This Visit's Progress    Chronic Care Management Pharmacy Plan   Not on track    [place] (see longitudinal plan of care for additional care plan information)  Current Barriers:  Chronic Disease Management support, education, and care coordination needs related to Hypertension, Hyperlipidemia, Heart Disease, Diabetes, Depression, GERD/History of GI Bleed, Essential Tremor, Hypomagnesemia, neuropathy   Hypertension BP Readings from Last 3 Encounters:  01/07/21 (!) 146/88  11/06/20 (!) 138/56  10/22/20 (!) 150/74  Pharmacist Clinical Goal(s): Over the next 90 days, patient will work with PharmD and providers to achieve BP goal <140/90 Current regimen:  Carvedilol 12.5mg  twice daily Losartan 50mg  daily Interventions: Requested patient check his blood pressure 1 to 2 times per week and record Patient self care activities - Over the next 90 days, patient will: Check blood pressure 1 to 2  times per week, document, and provide at future appointments Ensure daily salt intake < 2300 mg/day  Hyperlipidemia/CAD Lab Results  Component Value Date/Time   LDLCALC 24 04/01/2020 09:10 AM   LDLDIRECT 145.0 04/26/2018 10:26 AM  Pharmacist Clinical Goal(s): Over the next 90 days, patient will work with PharmD and providers to maintain LDL goal < 70  Current regimen:  Aspirin 81mg  daily Atorvastatin 80mg  daily Nitroglycerin 0.4mg  as needed Patient self care activities - Over the next 90 days, patient will: Maintain cholesterol medication regimen.   Diabetes Lab Results  Component Value Date/Time   HGBA1C 8.4 (A) 01/07/2021 09:17 AM   HGBA1C 8.7 (A) 09/03/2020 11:26 AM   HGBA1C 9.7 (H) 04/01/2020 09:10 AM   HGBA1C 7.7 09/12/2019 12:00 AM   HGBA1C 8.2 04/20/2019 12:00 AM  Pharmacist Clinical Goal(s): Over the next 90 days, patient will work with PharmD and providers to achieve A1c goal <7% while preventing  increase episodes of hypoglycemia in coordination with patient's endocrinologist Current regimen:  Novolin 70/30 18 units with breakfast and 14 units with evening meal Metformin 1000mg  twice daily Interventions: Discussed diet and exercise Reviewed home blood glucose readings and reviewed goals  Fasting blood glucose goal (before meals) = 80 to 130 Blood glucose goal after a meal = less than 180 Reminded to check BG 2 to 3 times per day per Dr Kelton Pillar.  Patient self care activities - Over the next 90 days, patient will: Check blood sugar twice daily, document, and provide at future appointments Contact provider with any episodes of hypoglycemia Decreased sweet snacks like banana pudding and cookies; instead try smaller servings size (1/2 cup) or lower carbohydrate snack (popcorn, veggies, low fat cheese)  Neuropathy Pharmacist Clinical Goal(s) Over the next 90 days, patient will work with PharmD and providers to maintain magnesium within normal limits and decrease neuropathy Current regimen:  OTC B12 1077mcg daily  Interventions: Consider repeat B12 in 3 to 6 months Consider balance therapy as recommended by Dr Carles Collet (neurologies) Patient self care activities - Over the next 90 days, patient will: Continue B12 106mcg daily Complete B12 level in 3 to 6 months (01/2021 to 04/2021) Recommend balance therapy to prevent future falls.   Hypomagnesemia Pharmacist Clinical Goal(s) Over the next 90 days, patient will work with PharmD and providers to maintain magnesium within normal limits Current regimen:  Magnesium oxide 400mg  twice daily Interventions: Consider repeat Magnesium level at next office visit Patient self care activities - Over the next 90 days, patient will: Maintain magnesium medication  regimen Complete magnesium level at next office visit  Health Maintenance  Pharmacist Clinical Goal(s) Over the next 90 days, patient will work with PharmD and providers to complete  health maintenance screenings/vaccinations Interventions: Recommended patient complete Shingrix vaccine series at pharmacy. Reminded he can get at either local pharmacy or pharmacy in our office when he sees Dr Larose Kells in August.  Discussed diet and exercise Patient self care activities - Over the next 90 days, patient will: Continue to be active daily either walking, yardwork Limit snacking and serving sizes.  Complete Shingrix Vaccine Series   Medication management Pharmacist Clinical Goal(s): Over the next 90 days, patient will work with PharmD and providers to maintain optimal medication adherence Current pharmacy: [facility] switched to [facility] Interventions Comprehensive medication review performed. Utilize [facility] pharmacy for medication synchronization, packaging and delivery Patient self care activities - Over the next 90 days, patient will: Focus on medication adherence by filling and taking medications appropriately  Take medications as prescribed Report any questions or concerns to PharmD and/or provider(s)  Please see past updates related to this goal by clicking on the "Past Updates" button in the selected goal           The patient verbalized understanding of instructions, educational materials, and care plan provided today and declined offer to receive copy of patient instructions, educational materials, and care plan.   Telephone follow up appointment with care management team member scheduled for: 1 month - CMA will check adherence; 3 months - clinical pharmacist follow up  [name], PharmD Clinical Pharmacist [facility] [phone]

## 2021-04-02 ENCOUNTER — Encounter: Payer: Self-pay | Admitting: Internal Medicine

## 2021-04-02 ENCOUNTER — Ambulatory Visit: Payer: PPO | Admitting: Internal Medicine

## 2021-04-17 ENCOUNTER — Other Ambulatory Visit (HOSPITAL_BASED_OUTPATIENT_CLINIC_OR_DEPARTMENT_OTHER): Payer: Self-pay

## 2021-04-17 ENCOUNTER — Encounter: Payer: Self-pay | Admitting: Internal Medicine

## 2021-04-17 ENCOUNTER — Other Ambulatory Visit: Payer: Self-pay

## 2021-04-17 ENCOUNTER — Ambulatory Visit (INDEPENDENT_AMBULATORY_CARE_PROVIDER_SITE_OTHER): Payer: PPO | Admitting: Internal Medicine

## 2021-04-17 VITALS — BP 144/72 | HR 55 | Temp 97.8°F | Resp 18 | Ht 74.0 in | Wt 221.0 lb

## 2021-04-17 DIAGNOSIS — E119 Type 2 diabetes mellitus without complications: Secondary | ICD-10-CM

## 2021-04-17 DIAGNOSIS — F32A Depression, unspecified: Secondary | ICD-10-CM | POA: Diagnosis not present

## 2021-04-17 DIAGNOSIS — I10 Essential (primary) hypertension: Secondary | ICD-10-CM

## 2021-04-17 DIAGNOSIS — D649 Anemia, unspecified: Secondary | ICD-10-CM

## 2021-04-17 DIAGNOSIS — F419 Anxiety disorder, unspecified: Secondary | ICD-10-CM | POA: Diagnosis not present

## 2021-04-17 DIAGNOSIS — E1140 Type 2 diabetes mellitus with diabetic neuropathy, unspecified: Secondary | ICD-10-CM

## 2021-04-17 DIAGNOSIS — Z794 Long term (current) use of insulin: Secondary | ICD-10-CM | POA: Diagnosis not present

## 2021-04-17 DIAGNOSIS — E7800 Pure hypercholesterolemia, unspecified: Secondary | ICD-10-CM

## 2021-04-17 DIAGNOSIS — E118 Type 2 diabetes mellitus with unspecified complications: Secondary | ICD-10-CM

## 2021-04-17 LAB — CBC WITH DIFFERENTIAL/PLATELET
Basophils Absolute: 0.1 10*3/uL (ref 0.0–0.1)
Basophils Relative: 1.1 % (ref 0.0–3.0)
Eosinophils Absolute: 0.4 10*3/uL (ref 0.0–0.7)
Eosinophils Relative: 6.3 % — ABNORMAL HIGH (ref 0.0–5.0)
HCT: 36.5 % — ABNORMAL LOW (ref 39.0–52.0)
Hemoglobin: 12.3 g/dL — ABNORMAL LOW (ref 13.0–17.0)
Lymphocytes Relative: 25.5 % (ref 12.0–46.0)
Lymphs Abs: 1.6 10*3/uL (ref 0.7–4.0)
MCHC: 33.6 g/dL (ref 30.0–36.0)
MCV: 86.3 fl (ref 78.0–100.0)
Monocytes Absolute: 0.6 10*3/uL (ref 0.1–1.0)
Monocytes Relative: 9.2 % (ref 3.0–12.0)
Neutro Abs: 3.7 10*3/uL (ref 1.4–7.7)
Neutrophils Relative %: 57.9 % (ref 43.0–77.0)
Platelets: 236 10*3/uL (ref 150.0–400.0)
RBC: 4.24 Mil/uL (ref 4.22–5.81)
RDW: 13.9 % (ref 11.5–15.5)
WBC: 6.3 10*3/uL (ref 4.0–10.5)

## 2021-04-17 LAB — LIPID PANEL
Cholesterol: 110 mg/dL (ref 0–200)
HDL: 43.8 mg/dL (ref >39.00)
LDL Cholesterol: 46 mg/dL (ref 0–99)
NonHDL: 66.02
Total CHOL/HDL Ratio: 3
Triglycerides: 102 mg/dL (ref 0.0–149.0)
VLDL: 20.4 mg/dL (ref 0.0–40.0)

## 2021-04-17 LAB — TSH: TSH: 1.11 u[IU]/mL (ref 0.35–5.50)

## 2021-04-17 LAB — BASIC METABOLIC PANEL
BUN: 9 mg/dL (ref 6–23)
CO2: 28 mEq/L (ref 19–32)
Calcium: 9 mg/dL (ref 8.4–10.5)
Chloride: 103 mEq/L (ref 96–112)
Creatinine, Ser: 0.64 mg/dL (ref 0.40–1.50)
GFR: 96.5 mL/min (ref >60.00)
Glucose, Bld: 172 mg/dL — ABNORMAL HIGH (ref 70–99)
Potassium: 4.5 mEq/L (ref 3.5–5.1)
Sodium: 139 mEq/L (ref 135–145)

## 2021-04-17 MED ORDER — ZOSTER VAC RECOMB ADJUVANTED 50 MCG/0.5ML IM SUSR
INTRAMUSCULAR | 1 refills | Status: DC
  Filled 2021-04-17: qty 0.5, 1d supply, fill #0

## 2021-04-17 MED ORDER — FLUOXETINE HCL 20 MG PO CAPS: ORAL_CAPSULE | ORAL | 4 refills | Status: DC

## 2021-04-17 NOTE — Patient Instructions (Addendum)
Recommend to proceed with the following vaccines at your pharmacy: Covid #4 Shingrix (shingles): You can get it at the first floor today Flu shot this fall   We are placing a referral back to Dr. Jorja Loa at [facility] in [place] for you diabetic eye exam. Please expect a call in the next several days to schedule an appointment.    Stop citalopram, start fluoxetine 20 mg capsules: 1 a day for 3 weeks, then 2 tablets daily.  Check the  blood pressure 2 or 3 times a week BP GOAL is between 110/65 and  135/85. If it is consistently higher or lower, let me know     GO TO THE LAB : Get the blood work     [place] back for a checkup in 3 months

## 2021-04-17 NOTE — Assessment & Plan Note (Signed)
DM: Last visit with Endo 12/2020 HTN: Ideally controlled?  Recommend to monitor BPs closely at home.  See AVS. Continue carvedilol, losartan, check BMP, CBC. Hyperlipidemia: On Lipitor, check FLP. Skin cancer: Per dermatology Neuropathy:  Reported dizziness before, today he called sxs "lack of balance" Saw neurology, they suspected gait instability due to peripheral neuropathy, NCV,EMG:Chronic and symmetric polyneuropathy, severe. Also diagnosed with low B12, now on orals. Subjectively doing the same  B12 def: new dx, On oral supplements, recheck on RTC CAD Saw cardiology March 2022: No changes made. Anxiety depression: His main complaints seem to be anxiety, lack of motivation, fatigue (without DOE, chest pain or edema).  Denies feeling sad or down per se. History of OSA, stopped CPAP after he lost about 100 pounds back in 2019. Plan: Switch citalopram to fluoxetine, reassess in 3 months. Preventive care: Recommend tShingrix , second COVID booster and a flu shot RTC 3 months

## 2021-04-17 NOTE — Progress Notes (Signed)
Subjective:    Patient ID: Randy Castro., male    DOB: [birth year], 70 y.o.   MRN: [id number]  DOS:  04/17/2021 Type of visit - description: Follow-up  Today with talk about his chronic medical problems. His main concern is anxiety and fatigue. "Things get to me very quickly".   He also feels unmotivated. Denies depression symptoms per se. I reviewed the notes from cardiology, neurology, NCS and labs.  BP Readings from Last 3 Encounters:  04/17/21 (!) 144/72  01/07/21 (!) 146/88  11/06/20 (!) 138/56     Review of Systems Denies chest pain or difficulty breathing. At times he is very active doing yard work with no problems. Denies nausea vomiting or diarrhea.  Past Medical History:  Diagnosis Date   Anxiety    Arthritis    BCC (basal cell carcinoma of skin) 12/2020   CAD (coronary artery disease)    a. NSTEMI 12/12: EF 40-45%. [facility]:ENIDPOE balloon PTCA + Promus DES x 1 to mid LAD;  b. 11/2012: Cath with Cutting balloon PTCA  LAD for ISR to LAD, EF 55%;  c. 12/2012 Cath/PCI: LAD stents patent, 80 ost Diag (jailed)->PTCA, LCX/RCA patent.   Essential hypertension    GI bleed    History of blood transfusion    was getting 4 units   Hyperlipidemia    Ischemic cardiomyopathy    a.  echo 08/06/11: dist ant wall, apical and septal and infero-apical HK, mild LVH, EF 40%, mild LAE, PASP 34, asc aorta mildly dilated, mild TR;  b.  Echo (3/13) showed recovery of LV systolic function with EF 42-35%, grade I diastolic dysfunction, mild MR.    Myocardial infarction [facility])    twice with NSTEMI 2012   Palpitations    Type 2 diabetes mellitus [facility])     Past Surgical History:  Procedure Laterality Date   ANTERIOR CERVICAL DECOMP/DISCECTOMY FUSION  in the 90s   CHOLECYSTECTOMY  03/27/2019   COLONOSCOPY  2021   COLONOSCOPY WITH ESOPHAGOGASTRODUODENOSCOPY (EGD)  10/2019   CORONARY ANGIOPLASTY  12/15/2012; 12/22/2012   CORONARY ANGIOPLASTY WITH STENT PLACEMENT  07/2011   "1" (12/22/2012)    GASTRIC BYPASS  12/21/2017   KNEE ARTHROSCOPY Left 12/31/2016   LEFT HEART CATHETERIZATION WITH CORONARY ANGIOGRAM N/A 08/06/2011   Procedure: LEFT HEART CATHETERIZATION WITH CORONARY ANGIOGRAM;  Surgeon: Burnell Blanks, MD;  Location: [facility] CATH LAB;  Service: Cardiovascular;  Laterality: N/A;   LEFT HEART CATHETERIZATION WITH CORONARY ANGIOGRAM N/A 12/15/2012   Procedure: LEFT HEART CATHETERIZATION WITH CORONARY ANGIOGRAM;  Surgeon: Sherren Mocha, MD;  Location[facility] CATH LAB;  Service: Cardiovascular;  Laterality: N/A;   LEFT HEART CATHETERIZATION WITH CORONARY ANGIOGRAM N/A 12/22/2012   Procedure: LEFT HEART CATHETERIZATION WITH CORONARY ANGIOGRAM;  Surgeon: Sherren Mocha, MD;  Location: [facility] CATH LAB;  Service: Cardiovascular;  Laterality: N/A;   PERCUTANEOUS CORONARY STENT INTERVENTION (PCI-S) Right 12/15/2012   Procedure: PERCUTANEOUS CORONARY STENT INTERVENTION (PCI-S);  Surgeon: Sherren Mocha, MD;  Location: [facility] CATH LAB;  Service: Cardiovascular;  Laterality: Right;    Allergies as of 04/17/2021       Reactions   Trulicity [dulaglutide] Nausea And Vomiting        Medication List        Accurate as of April 17, 2021  2:28 PM. If you have any questions, ask your nurse or doctor.          STOP taking these medications    citalopram 20 MG tablet Commonly known as: CELEXA Stopped  by: Kathlene November, MD       TAKE these medications    aspirin EC 81 MG tablet Take 81 mg by mouth daily.   atorvastatin 80 MG tablet Commonly known as: LIPITOR Take 1 tablet (80 mg total) by mouth at bedtime.   carvedilol 12.5 MG tablet Commonly known as: COREG Take 1 tablet (12.5 mg total) by mouth 2 (two) times daily with a meal.   clotrimazole-betamethasone cream Commonly known as: LOTRISONE Apply topically 2 (two) times daily as needed.   Dexcom G6 Receiver Devi 1 Device by Does not apply route as directed.   Dexcom G6 Sensor Misc 1 Device by Does not apply route as directed.    Dexcom G6 Transmitter Misc 1 Device by Does not apply route as directed.   FeroSul 325 (65 FE) MG tablet Generic drug: ferrous sulfate TAKE 1 TABLET BY MOUTH DAILY   FLUoxetine 20 MG capsule Commonly known as: PROzac 1 daily for 3 weeks, then 2 tablets daily Started by: Kathlene November, MD   losartan 50 MG tablet Commonly known as: COZAAR Take 1 tablet (50 mg total) by mouth at bedtime.   magnesium oxide 400 (241.3 Mg) MG tablet Commonly known as: MAGnesium-Oxide Take 1 tablet (400 mg total) by mouth 2 (two) times daily.   metFORMIN 1000 MG tablet Commonly known as: GLUCOPHAGE Take 1 tablet (1,000 mg total) by mouth 2 (two) times daily with a meal.   nitroGLYCERIN 0.4 MG SL tablet Commonly known as: NITROSTAT Place 1 tablet (0.4 mg total) under the tongue every 5 (five) minutes as needed for chest pain.   NovoLIN 70/30 Kwikpen (70-30) 100 UNIT/ML KwikPen Generic drug: insulin isophane & regular human KwikPen Inject 18 Units into the skin in the morning and at bedtime.   ONE TOUCH ULTRA 2 w/Device Kit by Does not apply route.   OneTouch Ultra test strip Generic drug: glucose blood Check blood sugar 2-3 times daily.  Dx Code: E11.9   pantoprazole 40 MG tablet Commonly known as: PROTONIX Take 1 tablet (40 mg total) by mouth 2 (two) times daily before a meal.   Potassium 99 MG Tabs Take 1 tablet by mouth daily.   primidone 50 MG tablet Commonly known as: MYSOLINE TAKE THREE TABLETS BY MOUTH EVERY MORNING and TAKE TWO TABLETS BY MOUTH EVERY EVENING   Shingrix injection Generic drug: Zoster Vaccine Adjuvanted Inject into the muscle as directed   vitamin B-12 1000 MCG tablet Commonly known as: CYANOCOBALAMIN Take 1,000 mcg by mouth daily.           Objective:   Physical Exam BP (!) 144/72 (BP Location: Left Arm, Patient Position: Sitting, Cuff Size: Normal)   Pulse (!) 55   Temp 97.8 F (36.6 C) (Oral)   Resp 18   Ht 6' 2"  (1.88 m)   Wt 221 lb (100.2 kg)    SpO2 97%   BMI 28.37 kg/m  General:   Well developed, NAD, BMI noted. HEENT:  Normocephalic . Face symmetric, atraumatic Lungs:  CTA B Normal respiratory effort, no intercostal retractions, no accessory muscle use. Heart: RRR,  no murmur.  Lower extremities: no pretibial edema bilaterally  Skin: Not pale. Not jaundice Neurologic:  alert & oriented X3.  Speech normal, gait appropriate for age and unassisted Psych--  Cognition and judgment appear intact.  Cooperative with normal attention span and concentration.  Behavior appropriate. No anxious or depressed appearing.      Assessment    Assessment   DM :  insulin dependent, uncontrolled, w/ CAD CHF. Intol Trulicity, used to see [facility] until 08/2019 Neuropathy, severe, documented by NCS 2022 HTN Hyperlipidemia CAD, CHF, ischemic cardiomyopathy, PVCs (Holter 2019 show 13.6% burden) NSTEMI in 12/12 (DES to LAD), EF 40%, unstable angina in 4/14 and 5/14 .  Repeat echo in 3/13 showed EF 55-60%. 4/14 >> unstable angina and had 90% pLAD in-stent restenosis (Rxcutting balloon angioplasty).  Readmitted, again with unstable angina, in 5/14.  This time he had PTCA to 90% ostial D1 stenosis.   OSA dx ~08-2017, on Cpap Anxiety-depression Essential Tremor dx 2019  Skin cancer: Sees dermatology Morbid obesity:gastric sleeve, 11-2017 Dr Raul Del 09-2019 GI bleed, gastric ulcer, required transfusion  PLAN: DM: Last visit with Endo 12/2020 HTN: Ideally controlled?  Recommend to monitor BPs closely at home.  See AVS. Continue carvedilol, losartan, check BMP, CBC. Hyperlipidemia: On Lipitor, check FLP. Skin cancer: Per dermatology Neuropathy:  Reported dizziness before, today he called sxs "lack of balance" Saw neurology, they suspected gait instability due to peripheral neuropathy, NCV,EMG:Chronic and symmetric polyneuropathy, severe. Also diagnosed with low B12, now on orals. Subjectively doing the same  B12 def: new dx, On oral supplements,  recheck on RTC CAD Saw cardiology March 2022: No changes made. Anxiety depression: His main complaints seem to be anxiety, lack of motivation, fatigue (without DOE, chest pain or edema).  Denies feeling sad or down per se. History of OSA, stopped CPAP after he lost about 100 pounds back in 2019. Plan: Switch citalopram to fluoxetine, reassess in 3 months. Preventive care: Recommend tShingrix , second COVID booster and a flu shot RTC 3 months    This visit occurred during the SARS-CoV-2 public health emergency.  Safety protocols were in place, including screening questions prior to the visit, additional usage of staff PPE, and extensive cleaning of exam room while observing appropriate contact time as indicated for disinfecting solutions.

## 2021-04-18 ENCOUNTER — Other Ambulatory Visit (HOSPITAL_BASED_OUTPATIENT_CLINIC_OR_DEPARTMENT_OTHER): Payer: Self-pay

## 2021-04-22 NOTE — Progress Notes (Signed)
Assessment/Plan:    1.  Essential Tremor  Continue primidone, 50 mg, 3 tablets in the morning, 2 in the evening  2.  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  3.  B12 deficiency  -pt with evidence of B12 deficiency.  Discussed with the patient that neurologically, would like to see B12 levels greater than 400.  Would recommend oral B12 supplementation, 1000 mcg daily.  He is doing that.  4.  F/u 1 year   Subjective:   Randy Castro. was seen today in follow up for essential tremor.  My previous records were reviewed prior to todays visit. Pt with wife who supplements hx.  Doing well with primidone without SE.  "Its a whole lot better."   last visit, follow-up patients gait instability was likely due to diabetic peripheral neuropathy.  EMG was done on December 04, 2020 by Dr. Posey Pronto and confirmed axonal peripheral neuropathy.  Lab work-up was completed to look for reversible causes.  His B12 was low at 257.  He was told to take a supplement.  Current prescribed movement disorder medications: Primidone, 50 mg, 3 in the morning, 2 at night   ALLERGIES:   Allergies  Allergen Reactions   Trulicity [Dulaglutide] Nausea And Vomiting    CURRENT MEDICATIONS:  Outpatient Encounter Medications as of 04/24/2021  Medication Sig   aspirin EC 81 MG tablet Take 81 mg by mouth daily.   atorvastatin (LIPITOR) 80 MG tablet Take 1 tablet (80 mg total) by mouth at bedtime.   Blood Glucose Monitoring Suppl (ONE TOUCH ULTRA 2) w/Device KIT by Does not apply route.   carvedilol (COREG) 12.5 MG tablet Take 1 tablet (12.5 mg total) by mouth 2 (two) times daily with a meal.   clotrimazole-betamethasone (LOTRISONE) cream Apply topically 2 (two) times daily as needed.   Continuous Blood Gluc Receiver (DEXCOM G6 RECEIVER) DEVI 1 Device by Does not apply route as directed.   Continuous Blood Gluc  Transmit (DEXCOM G6 TRANSMITTER) MISC 1 Device by Does not apply route as directed.   FEROSUL 325 (65 Fe) MG tablet TAKE 1 TABLET BY MOUTH DAILY   FLUoxetine (PROZAC) 20 MG capsule 1 daily for 3 weeks, then 2 tablets daily   glucose blood (ONETOUCH ULTRA) test strip Check blood sugar 2-3 times daily.  Dx Code: E11.9 (Patient taking differently: Check blood sugar 2-3 times daily.  Dx Code: E11.9)   losartan (COZAAR) 50 MG tablet Take 1 tablet (50 mg total) by mouth at bedtime.   magnesium oxide (MAGNESIUM-OXIDE) 400 (241.3 Mg) MG tablet Take 1 tablet (400 mg total) by mouth 2 (two) times daily.   metFORMIN (GLUCOPHAGE) 1000 MG tablet Take 1 tablet (1,000 mg total) by mouth 2 (two) times daily with a meal.   nitroGLYCERIN (NITROSTAT) 0.4 MG SL tablet Place 1 tablet (0.4 mg total) under the tongue every 5 (five) minutes as needed for chest pain.   NOVOLIN 70/30 FLEXPEN (70-30) 100 UNIT/ML KwikPen Inject 18 Units into the skin in the morning and at bedtime.   pantoprazole (PROTONIX) 40 MG tablet Take 1 tablet (40 mg total) by mouth 2 (two) times daily before a meal.   Potassium 99 MG TABS Take 1 tablet by mouth daily.   primidone (MYSOLINE) 50 MG tablet TAKE THREE TABLETS BY MOUTH EVERY MORNING and TAKE TWO TABLETS BY MOUTH EVERY EVENING   vitamin B-12 (CYANOCOBALAMIN) 1000 MCG tablet  Take 1,000 mcg by mouth daily.   Zoster Vaccine Adjuvanted [facility]) injection Inject into the muscle as directed   [DISCONTINUED] Continuous Blood Gluc Sensor (DEXCOM G6 SENSOR) MISC 1 Device by Does not apply route as directed.   No facility-administered encounter medications on file as of 04/24/2021.     Objective:    PHYSICAL EXAMINATION:    VITALS:   Vitals:   04/24/21 1043  BP: (!) 147/62  Pulse: 72  SpO2: 97%  Weight: 212 lb 9.6 oz (96.4 kg)  Height: 6' 2" (1.88 m)     GEN:  The patient appears stated age and is in NAD. HEENT:  Normocephalic, atraumatic.  The mucous membranes are moist. The  superficial temporal arteries are without ropiness or tenderness. CV:  RRR Lungs:  CTAB Neck/HEME:  There are no carotid bruits bilaterally.  Neurological examination:  Orientation: The patient is alert and oriented x3. Cranial nerves: There is good facial symmetry. The speech is fluent and clear. Soft palate rises symmetrically and there is no tongue deviation. Hearing is intact to conversational tone. Sensation: Sensation is intact to light touch throughout.  There is absence of vibratory sensation in the big toe and decreased in the bilateral ankle and knee. Motor: Strength is at least antigravity x4.  Movement examination: Tone: There is normal tone in the UE/LE Abnormal movements: no rest tremor.  No postural or intention tremor today Coordination:  There is no decremation with RAM's,  Gait and Station: The patient has no difficulty arising out of a deep-seated chair without the use of the hands. The patient's gait is antalgic (bad knee per pt).  He is unable to ambulate in a tandem fashion. I have reviewed and interpreted the following labs independently   Lab Results  Component Value Date   TSH 1.11 04/17/2021     Chemistry      Component Value Date/Time   NA 139 04/17/2021 0919   K 4.5 04/17/2021 0919   CL 103 04/17/2021 0919   CO2 28 04/17/2021 0919   BUN 9 04/17/2021 0919   CREATININE 0.64 04/17/2021 0919   CREATININE 1.20 06/15/2016 0849      Component Value Date/Time   CALCIUM 9.0 04/17/2021 0919   ALKPHOS 69 10/03/2020 1020   AST 37 10/03/2020 1020   ALT 47 10/03/2020 1020   BILITOT 0.5 10/03/2020 1020       Lab Results  Component Value Date   HGBA1C 8.4 (A) 01/07/2021    Lab Results  Component Value Date   VITAMINB12 257 10/22/2020      Total time spent on today's visit was 20 minutes, including both face-to-face time and nonface-to-face time.  Time included that spent on review of records (prior notes available to me/labs/imaging if pertinent),  discussing treatment and goals, answering patient's questions and coordinating care.  Cc:  Colon Branch, MD

## 2021-04-24 ENCOUNTER — Ambulatory Visit: Payer: PPO | Admitting: Neurology

## 2021-04-24 ENCOUNTER — Other Ambulatory Visit: Payer: Self-pay

## 2021-04-24 ENCOUNTER — Encounter: Payer: Self-pay | Admitting: Neurology

## 2021-04-24 VITALS — BP 147/62 | HR 72 | Ht 74.0 in | Wt 212.6 lb

## 2021-04-24 DIAGNOSIS — E1142 Type 2 diabetes mellitus with diabetic polyneuropathy: Secondary | ICD-10-CM | POA: Diagnosis not present

## 2021-04-24 DIAGNOSIS — G250 Essential tremor: Secondary | ICD-10-CM | POA: Diagnosis not present

## 2021-04-24 NOTE — Patient Instructions (Signed)
Essential Tremor A tremor is trembling or shaking that a person cannot control. Most tremors affect the hands or arms. Tremors can also affect the head, vocal cords, legs, and other parts of the body. Essential tremor is a tremor without a known cause. Usually, it occurs while a person is trying to perform an action. Ittends to get worse gradually as a person ages. What are the causes? The cause of this condition is not known. What increases the risk? You are more likely to develop this condition if: You have a family member with essential tremor. You are age 69 or older. You take certain medicines. What are the signs or symptoms? The main sign of a tremor is a rhythmic shaking of certain parts of your body that is uncontrolled and unintentional. You may: Have difficulty eating with a spoon or fork. Have difficulty writing. Nod your head up and down or side to side. Have a quivering voice. The shaking may: Get worse over time. Come and go. Be more noticeable on one side of your body. Get worse due to stress, fatigue, caffeine, and extreme heat or cold. How is this diagnosed? This condition may be diagnosed based on: Your symptoms and medical history. A physical exam. There is no single test to diagnose an essential tremor. However, your health care provider may order tests to rule out other causes of your condition. These may include: Blood and urine tests. Imaging studies of your brain, such as CT scan and MRI. A test that measures involuntary muscle movement (electromyogram). How is this treated? Treatment for essential tremor depends on the severity of the condition. Some tremors may go away without treatment. Mild tremors may not need treatment if they do not affect your day-to-day life. Severe tremors may need to be treated using one or more of the following options: Medicines. Lifestyle changes. Occupational or physical therapy. Follow these instructions at  home: Lifestyle  Do not use any products that contain nicotine or tobacco, such as cigarettes and e-cigarettes. If you need help quitting, ask your health care provider. Limit your caffeine intake as told by your health care provider. Try to get 8 hours of sleep each night. Find ways to manage your stress that fits your lifestyle and personality. Consider trying meditation or yoga. Try to anticipate stressful situations and allow extra time to manage them. If you are struggling emotionally with the effects of your tremor, consider working with a mental health provider.  General instructions Take over-the-counter and prescription medicines only as told by your health care provider. Avoid extreme heat and extreme cold. Keep all follow-up visits as told by your health care provider. This is important. Visits may include physical therapy visits. Contact a health care provider if: You experience any changes in the location or intensity of your tremors. You start having a tremor after starting a new medicine. You have tremor with other symptoms, such as: Numbness. Tingling. Pain. Weakness. Your tremor gets worse. Your tremor interferes with your daily life. You feel down, blue, or sad for at least 2 weeks in a row. Worrying about your tremor and what other people think about you interferes with your everyday life functions, including relationships, work, or school. Summary Essential tremor is a tremor without a known cause. Usually, it occurs when you are trying to perform an action. You are more likely to develop this condition if you have a family member with essential tremor. The main sign of a tremor is a rhythmic shaking of   certain parts of your body that is uncontrolled and unintentional. Treatment for essential tremor depends on the severity of the condition. This information is not intended to replace advice given to you by your health care provider. Make sure you discuss any  questions you have with your healthcare provider. Document Revised: 05/03/2020 Document Reviewed: 05/03/2020 Elsevier Patient Education  2022 Elsevier Inc.  

## 2021-05-01 ENCOUNTER — Other Ambulatory Visit (HOSPITAL_BASED_OUTPATIENT_CLINIC_OR_DEPARTMENT_OTHER): Payer: Self-pay

## 2021-05-07 ENCOUNTER — Other Ambulatory Visit: Payer: Self-pay | Admitting: Neurology

## 2021-05-07 ENCOUNTER — Other Ambulatory Visit: Payer: Self-pay | Admitting: Internal Medicine

## 2021-05-07 DIAGNOSIS — G250 Essential tremor: Secondary | ICD-10-CM

## 2021-05-12 ENCOUNTER — Ambulatory Visit: Payer: PPO | Admitting: Cardiology

## 2021-05-12 ENCOUNTER — Encounter: Payer: Self-pay | Admitting: Cardiology

## 2021-05-12 ENCOUNTER — Other Ambulatory Visit: Payer: Self-pay

## 2021-05-12 VITALS — BP 144/80 | HR 55 | Ht 74.0 in | Wt 219.4 lb

## 2021-05-12 DIAGNOSIS — E782 Mixed hyperlipidemia: Secondary | ICD-10-CM

## 2021-05-12 DIAGNOSIS — I25119 Atherosclerotic heart disease of native coronary artery with unspecified angina pectoris: Secondary | ICD-10-CM | POA: Diagnosis not present

## 2021-05-12 DIAGNOSIS — E1165 Type 2 diabetes mellitus with hyperglycemia: Secondary | ICD-10-CM

## 2021-05-12 NOTE — Progress Notes (Signed)
Cardiology Office Note  Date: 05/12/2021   ID: Randy Foots., DOB [birth year], MRN [id number]  PCP:  Colon Branch, MD  Cardiologist:  Rozann Lesches, MD Electrophysiologist:  None   Chief Complaint  Patient presents with   Cardiac follow-up    History of Present Illness: Randy Gardella. is a 70 y.o. male last seen in March.  He is here for a routine follow-up visit.  Since last assessment he does not describe any recurrent chest pain or palpitations, no nitroglycerin use.  He just got back from a weekend trip to the beach with his wife.  He continues to follow with Dr. Larose Kells, I reviewed his most recent lab work.  Lipids look much better controlled with LDL down to 46.  We went over his medications which are noted below.  Cardiac regimen has been stable.  Last assessment of LVEF was in the range of 50 to 55%.  I did talk with him about possibility of SGLT2 inhibitor as adjunct of treatment for not only his type 2 diabetes mellitus but also to reduce cardiovascular risk.  Past Medical History:  Diagnosis Date   Anxiety    Arthritis    BCC (basal cell carcinoma of skin) 12/2020   CAD (coronary artery disease)    a. NSTEMI 12/12: EF 40-45%. MBP:JPETKKO balloon PTCA + Promus DES x 1 to mid LAD;  b. 11/2012: Cath with Cutting balloon PTCA  LAD for ISR to LAD, EF 55%;  c. 12/2012 Cath/PCI: LAD stents patent, 80 ost Diag (jailed)->PTCA, LCX/RCA patent.   Essential hypertension    GI bleed    History of blood transfusion    was getting 4 units   Hyperlipidemia    Ischemic cardiomyopathy    a.  echo 08/06/11: dist ant wall, apical and septal and infero-apical HK, mild LVH, EF 40%, mild LAE, PASP 34, asc aorta mildly dilated, mild TR;  b.  Echo (3/13) showed recovery of LV systolic function with EF 46-95%, grade I diastolic dysfunction, mild MR.    Myocardial infarction [facility])    twice with NSTEMI 2012   Palpitations    Type 2 diabetes mellitus [facility])     Past Surgical History:   Procedure Laterality Date   ANTERIOR CERVICAL DECOMP/DISCECTOMY FUSION  in the 90s   CHOLECYSTECTOMY  03/27/2019   COLONOSCOPY  2021   COLONOSCOPY WITH ESOPHAGOGASTRODUODENOSCOPY (EGD)  10/2019   CORONARY ANGIOPLASTY  12/15/2012; 12/22/2012   CORONARY ANGIOPLASTY WITH STENT PLACEMENT  07/2011   "1" (12/22/2012)   GASTRIC BYPASS  12/21/2017   KNEE ARTHROSCOPY Left 12/31/2016   LEFT HEART CATHETERIZATION WITH CORONARY ANGIOGRAM N/A 08/06/2011   Procedure: LEFT HEART CATHETERIZATION WITH CORONARY ANGIOGRAM;  Surgeon: Burnell Blanks, MD;  Location: [facility] CATH LAB;  Service: Cardiovascular;  Laterality: N/A;   LEFT HEART CATHETERIZATION WITH CORONARY ANGIOGRAM N/A 12/15/2012   Procedure: LEFT HEART CATHETERIZATION WITH CORONARY ANGIOGRAM;  Surgeon: Sherren Mocha, MD;  Location: [facility] CATH LAB;  Service: Cardiovascular;  Laterality: N/A;   LEFT HEART CATHETERIZATION WITH CORONARY ANGIOGRAM N/A 12/22/2012   Procedure: LEFT HEART CATHETERIZATION WITH CORONARY ANGIOGRAM;  Surgeon: Sherren Mocha, MD;  Location: [facility] CATH LAB;  Service: Cardiovascular;  Laterality: N/A;   PERCUTANEOUS CORONARY STENT INTERVENTION (PCI-S) Right 12/15/2012   Procedure: PERCUTANEOUS CORONARY STENT INTERVENTION (PCI-S);  Surgeon: Sherren Mocha, MD;  Location: [facility] CATH LAB;  Service: Cardiovascular;  Laterality: Right;    Current Outpatient Medications  Medication Sig Dispense Refill   aspirin  EC 81 MG tablet Take 81 mg by mouth daily.     atorvastatin (LIPITOR) 80 MG tablet TAKE 1 TABLET(80 MG) BY MOUTH AT BEDTIME 90 tablet 1   Blood Glucose Monitoring Suppl (ONE TOUCH ULTRA 2) w/Device KIT by Does not apply route.     carvedilol (COREG) 12.5 MG tablet TAKE 1 TABLET(12.5 MG) BY MOUTH TWICE DAILY WITH A MEAL 180 tablet 1   clotrimazole-betamethasone (LOTRISONE) cream Apply topically 2 (two) times daily as needed. 30 g 5   Continuous Blood Gluc Receiver (DEXCOM G6 RECEIVER) DEVI 1 Device by Does not apply route as directed. 1  each 0   Continuous Blood Gluc Transmit (DEXCOM G6 TRANSMITTER) MISC 1 Device by Does not apply route as directed. 1 each 3   FEROSUL 325 (65 Fe) MG tablet TAKE 1 TABLET BY MOUTH DAILY 30 tablet 3   FLUoxetine (PROZAC) 20 MG capsule 1 daily for 3 weeks, then 2 tablets daily 60 capsule 4   glucose blood (ONETOUCH ULTRA) test strip Check blood sugar 2-3 times daily.  Dx Code: E11.9 (Patient taking differently: Check blood sugar 2-3 times daily.  Dx Code: E11.9) 300 each 12   losartan (COZAAR) 50 MG tablet Take 1 tablet (50 mg total) by mouth at bedtime. 90 tablet 1   magnesium oxide (MAG-OX) 400 MG tablet SMARTSIG:1 Tablet(s) By Mouth Morning-Night     magnesium oxide (MAGNESIUM-OXIDE) 400 (241.3 Mg) MG tablet Take 1 tablet (400 mg total) by mouth 2 (two) times daily. 180 tablet 3   metFORMIN (GLUCOPHAGE) 1000 MG tablet Take 1 tablet (1,000 mg total) by mouth 2 (two) times daily with a meal. 180 tablet 1   nitroGLYCERIN (NITROSTAT) 0.4 MG SL tablet Place 1 tablet (0.4 mg total) under the tongue every 5 (five) minutes as needed for chest pain. 100 tablet 3   NOVOLIN 70/30 FLEXPEN (70-30) 100 UNIT/ML KwikPen Inject 18 Units into the skin in the morning and at bedtime.     pantoprazole (PROTONIX) 40 MG tablet TAKE 1 TABLET(40 MG) BY MOUTH TWICE DAILY BEFORE A MEAL 180 tablet 1   Potassium 99 MG TABS Take 1 tablet by mouth daily.     primidone (MYSOLINE) 50 MG tablet TAKE 3 TABLETS BY MOUTH EVERY MORNING AND 2 TABLETS BY MOUTH EVERY EVENING 450 tablet 1   vitamin B-12 (CYANOCOBALAMIN) 1000 MCG tablet Take 1,000 mcg by mouth daily.     Zoster Vaccine Adjuvanted [facility]) injection Inject into the muscle as directed 0.5 mL 1   No current facility-administered medications for this visit.   Allergies:  Trulicity [dulaglutide]   ROS: No palpitations or syncope.  Physical Exam: VS:  BP (!) 144/80 (BP Location: Right Arm, Patient Position: Sitting, Cuff Size: Normal)   Pulse (!) 55   Ht 6' 2"  (1.88 m)    Wt 219 lb 6.4 oz (99.5 kg)   SpO2 98%   BMI 28.17 kg/m , BMI Body mass index is 28.17 kg/m.  Wt Readings from Last 3 Encounters:  05/12/21 219 lb 6.4 oz (99.5 kg)  04/24/21 212 lb 9.6 oz (96.4 kg)  04/17/21 221 lb (100.2 kg)    General: Patient appears comfortable at rest. HEENT: Conjunctiva and lids normal, wearing a mask. Neck: Supple, no elevated JVP or carotid bruits, no thyromegaly. Lungs: Clear to auscultation, nonlabored breathing at rest. Cardiac: Regular rate and rhythm, no S3, 1/6 systolic murmur. Abdomen: Soft, nontender, bowel sounds present. Extremities: No pitting edema.  ECG:  An ECG dated 11/06/2020 was  personally reviewed today and demonstrated:  Sinus rhythm with LVH and IVCD.  Recent Labwork: 10/03/2020: ALT 47; AST 37 04/17/2021: BUN 9; Creatinine, Ser 0.64; Hemoglobin 12.3; Platelets 236.0; Potassium 4.5; Sodium 139; TSH 1.11     Component Value Date/Time   CHOL 110 04/17/2021 0919   TRIG 102.0 04/17/2021 0919   HDL 43.80 04/17/2021 0919   CHOLHDL 3 04/17/2021 0919   VLDL 20.4 04/17/2021 0919   LDLCALC 46 04/17/2021 0919   LDLDIRECT 145.0 04/26/2018 1026    Other Studies Reviewed Today:  Echocardiogram 03/21/2020:  1. Left ventricular ejection fraction, by estimation, is 50 to 55%. The  left ventricle has low normal function. The left ventricle has no regional  wall motion abnormalities. Left ventricular diastolic parameters are  consistent with Grade I diastolic  dysfunction (impaired relaxation).   2. Right ventricular systolic function is normal. The right ventricular  size is normal. There is normal pulmonary artery systolic pressure.   3. Left atrial size was severely dilated.   4. The mitral valve is normal in structure. Mild mitral valve  regurgitation. No evidence of mitral stenosis.   5. The aortic valve is tricuspid. Aortic valve regurgitation is not  visualized. No aortic stenosis is present.   6. Aortic dilatation noted. There is mild  dilatation of the aortic root  measuring 41 mm.   7. The inferior vena cava is normal in size with greater than 50%  respiratory variability, suggesting right atrial pressure of 3 mmHg.   8. Probable small PFO with left to right shunt.   Assessment and Plan:  1.  CAD status post previous PCI of the LAD and most recently Cutting Balloon angioplasty for management of in-stent restenosis in 2014 with associated angioplasty of the diagonal as well.  We are continuing medical therapy in the absence of angina symptoms.  Last LVEF 50 to 55%.  Continue aspirin, losartan, Coreg, Lipitor, and as needed nitroglycerin.  2.  Type 2 diabetes mellitus, on Glucophage and NovoLog.  Would likely be a good candidate for SGLT2 inhibitor as well particular in light of his cardiovascular disease.  He plans to discuss this with Dr. Larose Kells.  3.  Mixed hyperlipidemia, on Lipitor with recent LDL 46.  Medication Adjustments/Labs and Tests Ordered: Current medicines are reviewed at length with the patient today.  Concerns regarding medicines are outlined above.   Tests Ordered: No orders of the defined types were placed in this encounter.   Medication Changes: No orders of the defined types were placed in this encounter.   Disposition:  Follow up  6 months.  Signed, Satira Sark, MD, [facility] 05/12/2021 11:51 AM    [place] [facility] [place], [place], [place] [address] Phone: [phone]; Fax: [phone]

## 2021-05-12 NOTE — Patient Instructions (Addendum)
Medication Instructions:  Continue all current medications.   Labwork: none  Testing/Procedures: none  Follow-Up: 6 months   Any Other Special Instructions Will Be Listed Below (If Applicable).   If you need a refill on your cardiac medications before your next appointment, please call your pharmacy.  

## 2021-05-26 ENCOUNTER — Ambulatory Visit (INDEPENDENT_AMBULATORY_CARE_PROVIDER_SITE_OTHER): Payer: PPO | Admitting: Pharmacist

## 2021-05-26 DIAGNOSIS — E7800 Pure hypercholesterolemia, unspecified: Secondary | ICD-10-CM

## 2021-05-26 DIAGNOSIS — E118 Type 2 diabetes mellitus with unspecified complications: Secondary | ICD-10-CM

## 2021-05-26 DIAGNOSIS — F419 Anxiety disorder, unspecified: Secondary | ICD-10-CM

## 2021-05-26 DIAGNOSIS — I10 Essential (primary) hypertension: Secondary | ICD-10-CM

## 2021-05-26 NOTE — Patient Instructions (Signed)
Visit Information  PATIENT GOALS:  Goals Addressed             This Visit's Progress    Chronic Care Management Pharmacy Plan       CARE PLAN ENTRY (see longitudinal plan of care for additional care plan information)  Current Barriers:  Chronic Disease Management support, education, and care coordination needs related to Hypertension, Hyperlipidemia, Heart Disease, Diabetes, Depression, GERD/History of GI Bleed, Essential Tremor, Hypomagnesemia, neuropathy   Hypertension BP Readings from Last 3 Encounters:  05/12/21 (!) 144/80  04/24/21 (!) 147/62  04/17/21 (!) 144/72  Pharmacist Clinical Goal(s): Over the next 90 days, patient will work with PharmD and providers to achieve BP goal <140/90 Current regimen:  Carvedilol 12.5mg  twice daily Losartan 50mg  daily Interventions: Requested patient check his blood pressure 1 to 2 times per week and record Patient self care activities - Over the next 90 days, patient will: Check blood pressure 1 to 2  times per week, document, and provide at future appointments Ensure daily salt intake < 2300 mg/day Important to take medications for blood pressure EVERY day to lower blood pressure to recommended goal.  Hyperlipidemia/CAD Lab Results  Component Value Date/Time   LDLCALC 46 04/17/2021 09:19 AM   LDLDIRECT 145.0 04/26/2018 10:26 AM  Pharmacist Clinical Goal(s): Over the next 90 days, patient will work with PharmD and providers to maintain LDL goal < 70  Current regimen:  Aspirin 81mg  daily Atorvastatin 80mg  daily Nitroglycerin 0.4mg  as needed Patient self care activities - Over the next 90 days, patient will: Maintain cholesterol medication regimen.   Diabetes Lab Results  Component Value Date/Time   HGBA1C 8.4 (A) 01/07/2021 09:17 AM   HGBA1C 8.7 (A) 09/03/2020 11:26 AM   HGBA1C 9.7 (H) 04/01/2020 09:10 AM   HGBA1C 7.7 09/12/2019 12:00 AM   HGBA1C 8.2 04/20/2019 12:00 AM  Pharmacist Clinical Goal(s): Over the next 90 days,  patient will work with PharmD and providers to achieve A1c goal <8% while preventing increase episodes of hypoglycemia in coordination with patient's endocrinologist Current regimen:  Novolin 70/30 18 units with breakfast and 14 units with evening meal Metformin 1000mg  twice daily Interventions: Discussed diet and exercise Reviewed home blood glucose readings and reviewed goals  Fasting blood glucose goal (before meals) = 80 to 130 Blood glucose goal after a meal = less than 180 Reminded to check BG 2 to 3 times per day per Dr Kelton Pillar.  Patient self care activities - Over the next 90 days, patient will: Check blood sugar twice daily, document, and provide at future appointments Contact provider with any episodes of hypoglycemia Decreased sweet snacks like banana pudding and cookies; instead try smaller servings size (1/2 cup) or lower carbohydrate snack (popcorn, veggies, low fat cheese)  Neuropathy Pharmacist Clinical Goal(s) Over the next 90 days, patient will work with PharmD and providers to maintain magnesium within normal limits and decrease neuropathy Current regimen:  OTC B12 1051mcg daily  Interventions: Consider repeat B12 in 3 to 6 months Consider balance therapy as recommended by Dr Carles Collet (neurologies) Patient self care activities - Over the next 90 days, patient will: Continue B12 1066mcg daily Complete B12 level in 3 to 6 months (01/2021 to 04/2021) Recommend balance therapy to prevent future falls.   Hypomagnesemia Pharmacist Clinical Goal(s) Over the next 90 days, patient will work with PharmD and providers to maintain magnesium within normal limits Current regimen:  Magnesium oxide 400mg  twice daily Interventions: Consider repeat Magnesium level at next office visit Patient  self care activities - Over the next 90 days, patient will: Maintain magnesium medication regimen Complete magnesium level at next office visit  Health Maintenance  Pharmacist Clinical  Goal(s) Over the next 90 days, patient will work with PharmD and providers to complete health maintenance screenings/vaccinations Interventions: Recommended annual flu vaccine and COVID booster Discussed diet and exercise Patient self care activities - Over the next 90 days, patient will: Get annual flu vaccine and COVID booster in October 2022 Complete Shingrix Vaccine Series . Second Shingrix can be given 06/2021 to 09/2021 Continue to be active daily either walking, yardwork Limit snacking and serving sizes.    Medication management Pharmacist Clinical Goal(s): Over the next 90 days, patient will work with PharmD and providers to maintain optimal medication adherence Current pharmacy: [facility] Interventions Comprehensive medication review performed. Utilize [facility] pharmacy for medication synchronization, packaging and delivery Patient self care activities - Over the next 90 days, patient will: Focus on medication adherence  Adherence strategies to try: Place morning and evening medications by toothbrush to remember to take Drink a glass of water every morning and evening and place medications by sink in kitchen to improve adherence Set an alarm / reminder on your phone for morning and evening medication doses.  Coordinated refill for fluoxetine to be delivered in next few days (full prescription will be fill when maintenance medications are filled at the end of October.  Take medications as prescribed Report any questions or concerns to PharmD and/or provider(s)  Please see past updates related to this goal by clicking on the "Past Updates" button in the selected goal          Patient verbalizes understanding of instructions provided today and agrees to view in [place].   Telephone follow up appointment with care management team member scheduled for: 30 days to assess adherence; Clinical pharmacist in 2 to 3 months.  Signature [name], PharmD Clinical  Pharmacist [facility]

## 2021-05-26 NOTE — Chronic Care Management (AMB) (Signed)
Chronic Care Management Pharmacy Note  05/26/2021 Name:  Randy Castro. MRN:  [id number] DOB:  August 10, 1951  Subjective: Randy Foots. is an 70 y.o. year old male who is a primary patient of Paz, Alda Berthold, MD.  The CCM team was consulted for assistance with disease management and care coordination needs.    Engaged with patient by telephone for follow up visit in response to provider referral for pharmacy case management and/or care coordination services.   Consent to Services:  The patient was given information about Chronic Care Management services, agreed to services, and gave verbal consent prior to initiation of services.  Please see initial visit note for detailed documentation.   Patient Care Team: Colon Branch, MD as PCP - General (Internal Medicine) Satira Sark, MD as PCP - Cardiology (Cardiology) Larey Dresser, MD as Consulting Physician (Cardiology) Lake Bells., MD as Referring Physician (Gastroenterology) Gearlean Alf, PA-C as Physician Assistant (Pulmonary Disease) Demetrius Revel, MD as Consulting Physician (Bariatrics) Younts, Javier Glazier, NP as Nurse Practitioner (Endocrinology) [name], [name], DO as Consulting Physician (Neurology) [name], PharmD (Pharmacist)  Recent office visits: 04/17/2021 - PCP (Dr Larose Kells) - Follow up chronic conditions. Expressed increased anxieyt and fatigue. Changed citalopram 49m to fluoxetine 288mdaily for 3 weeks, then increase to 4044maily.  Recent consult visits: 05/12/2021 - Cardio (Dr McDDomenic Politeoutine cardio f/u. No med changes. Suggested discussing SGLT2 with either endo or PCP for DM and cardiac benefits.   04/24/2021 - Neuro (Dr [name]) F/U essential tremor, gait instability, B12 def. No med changes noted.  01/07/2021 - Endo (Shamleffer) F/U type 2 DM; Changed Novolin 70/30 insulin from 18 units bid to 14 units prior to breakfast and 18 units prior to supper. Dexcom also prescribed.   12/04/2020 Neuro (Dr  PatPosey Pronto/U neuropathy and paresthesia. No med changes  Hospital visits: None in previous 6 months  Objective:  Lab Results  Component Value Date   CREATININE 0.64 04/17/2021   CREATININE 0.71 10/03/2020   CREATININE 0.66 04/15/2020    Lab Results  Component Value Date   HGBA1C 8.4 (A) 01/07/2021   Last diabetic Eye exam:  Lab Results  Component Value Date/Time   HMDIABEYEEXA Retinopathy (A) 07/19/2019 12:00 AM    Last diabetic Foot exam:  Lab Results  Component Value Date/Time   HMDIABFOOTEX Done 03/19/2019 12:00 AM        Component Value Date/Time   CHOL 110 04/17/2021 0919   TRIG 102.0 04/17/2021 0919   HDL 43.80 04/17/2021 0919   CHOLHDL 3 04/17/2021 0919   VLDL 20.4 04/17/2021 0919   LDLCALC 46 04/17/2021 0919   LDLDIRECT 145.0 04/26/2018 1026    Hepatic Function Latest Ref Rng & Units 10/22/2020 10/03/2020 11/30/2019  Total Protein 6.1 - 8.1 g/dL 5.9(L) 6.1 5.7(L)  Albumin 3.5 - 5.2 g/dL - 4.0 3.9  AST 0 - 37 U/L - 37 33  ALT 0 - 53 U/L - 47 42  Alk Phosphatase 39 - 117 U/L - 69 80  Total Bilirubin 0.2 - 1.2 mg/dL - 0.5 0.5  Bilirubin, Direct <=0.2 mg/dL - - -    Lab Results  Component Value Date/Time   TSH 1.11 04/17/2021 09:19 AM   TSH 0.92 04/26/2018 10:26 AM    CBC Latest Ref Rng & Units 04/17/2021 10/03/2020 04/01/2020  WBC 4.0 - 10.5 K/uL 6.3 6.0 6.5  Hemoglobin 13.0 - 17.0 g/dL 12.3(L) 12.6(L) 11.8(L)  Hematocrit 39.0 - 52.0 %  36.5(L) 37.9(L) 35.1(L)  Platelets 150.0 - 400.0 K/uL 236.0 226.0 246.0    No results found for: VD25OH  Clinical ASCVD: Yes  The ASCVD Risk score (Arnett DK, et al., 2019) failed to calculate for the following reasons:   The patient has a prior MI or stroke diagnosis     Social History   Tobacco Use  Smoking Status Former   Packs/day: 1.00   Years: 25.00   Pack years: 25.00   Types: Cigarettes   Quit date: 04/12/1992   Years since quitting: 29.1  Smokeless Tobacco Never   BP Readings from Last 3 Encounters:   05/12/21 (!) 144/80  04/24/21 (!) 147/62  04/17/21 (!) 144/72   Pulse Readings from Last 3 Encounters:  05/12/21 (!) 55  04/24/21 72  04/17/21 (!) 55   Wt Readings from Last 3 Encounters:  05/12/21 219 lb 6.4 oz (99.5 kg)  04/24/21 212 lb 9.6 oz (96.4 kg)  04/17/21 221 lb (100.2 kg)    Assessment: Review of patient past medical history, allergies, medications, health status, including review of consultants reports, laboratory and other test data, was performed as part of comprehensive evaluation and provision of chronic care management services.   SDOH:  (Social Determinants of Health) assessments and interventions performed:  SDOH Interventions    Flowsheet Row Most Recent Value  SDOH Interventions   Transportation Interventions Intervention Not Indicated  Depression Interventions/Treatment  Currently on Treatment       CCM Care Plan  Allergies  Allergen Reactions   Trulicity [Dulaglutide] Nausea And Vomiting    Medications Reviewed Today     Reviewed by [name], PharmD (Pharmacist) on 05/26/21 at 1316  Med List Status: <None>   Medication Order Taking? Sig Documenting Provider Last Dose Status Informant  aspirin EC 81 MG tablet [id number] Yes Take 81 mg by mouth daily. [provider] Taking Active   atorvastatin (LIPITOR) 80 MG tablet [id number] Yes TAKE 1 TABLET(80 MG) BY MOUTH AT BEDTIME Colon Branch, MD Taking Active   Blood Glucose Monitoring Suppl (ONE TOUCH ULTRA 2) w/Device KIT [id number] Yes by Does not apply route. [provider] Taking Active   carvedilol (COREG) 12.5 MG tablet [id number] Yes TAKE 1 TABLET(12.5 MG) BY MOUTH TWICE DAILY WITH A MEAL Colon Branch, MD Taking Active   clotrimazole-betamethasone (LOTRISONE) cream [id number] Yes Apply topically 2 (two) times daily as needed. Colon Branch, MD Taking Active   Continuous Blood Gluc Receiver ([place]) Brock [id number] Yes 1 Device by Does not apply route as directed.  Shamleffer, Melanie Crazier, MD Taking Active   Continuous Blood Gluc Transmit (DEXCOM G6 TRANSMITTER) MISC [id number] Yes 1 Device by Does not apply route as directed. Shamleffer, Melanie Crazier, MD Taking Active   FEROSUL 325 (65 Fe) MG tablet [id number] Yes TAKE 1 TABLET BY MOUTH DAILY Cirigliano, Vito V, DO Taking Active   FLUoxetine (PROZAC) 20 MG capsule [id number] Yes 1 daily for 3 weeks, then 2 tablets daily  Patient taking differently: Take 40 mg by mouth daily. 1 daily for 3 weeks, then 2 tablets daily   Colon Branch, MD Taking Active   glucose blood [facility] ULTRA) test strip [id number] Yes Check blood sugar 2-3 times daily.  Dx Code: E11.9  Patient taking differently: Check blood sugar 2-3 times daily.  Dx Code: E11.9   Colon Branch, MD Taking Active   losartan (COZAAR) 50 MG tablet [id number] Yes Take 1 tablet (50 mg total) by mouth at bedtime.  Colon Branch, MD Taking Active   magnesium oxide (MAG-OX) 400 MG tablet [id number] Yes SMARTSIG:1 Tablet(s) By Mouth Morning-Night [provider] Taking Active   magnesium oxide (MAGNESIUM-OXIDE) 400 (241.3 Mg) MG tablet [id number] Yes Take 1 tablet (400 mg total) by mouth 2 (two) times daily. Colon Branch, MD Taking Active   metFORMIN (GLUCOPHAGE) 1000 MG tablet [id number] Yes Take 1 tablet (1,000 mg total) by mouth 2 (two) times daily with a meal. Colon Branch, MD Taking Active   nitroGLYCERIN (NITROSTAT) 0.4 MG SL tablet [id number] Yes Place 1 tablet (0.4 mg total) under the tongue every 5 (five) minutes as needed for chest pain. Satira Sark, MD Taking Active            Med Note [facility], Vilma Prader D   Thu Apr 17, 2021  8:36 AM) PRN  NOVOLIN 70/30 FLEXPEN (70-30) 100 UNIT/ML KwikPen [id number] Yes Inject 18 Units into the skin in the morning and at bedtime. [provider] Taking Active            Med Note Antony Contras, [place] Feb 25, 2021  9:21 AM) Taking 18 units with morning meal and 14 units with evening meal.   pantoprazole  (PROTONIX) 40 MG tablet [id number] Yes TAKE 1 TABLET(40 MG) BY MOUTH TWICE DAILY BEFORE A MEAL Colon Branch, MD Taking Active   Potassium 99 MG TABS [id number] Yes Take 1 tablet by mouth daily. [provider] Taking Active Spouse/Significant Other  primidone (MYSOLINE) 50 MG tablet [id number] Yes TAKE 3 TABLETS BY MOUTH EVERY MORNING AND 2 TABLETS BY MOUTH EVERY EVENING [name], [name], DO Taking Active   vitamin B-12 (CYANOCOBALAMIN) 1000 MCG tablet [id number] Yes Take 1,000 mcg by mouth daily. 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   Abdominal pain 11/17/2018   Cardiac arrhythmia, unspecified 11/14/2017   Hypomagnesemia 11/14/2017   Anemia 11/14/2017   Bleeding skin mole 11/14/2017   OSA on CPAP 09/21/2017   Obesity 01/23/2016   PCP NOTES >>> 06/08/2015   Anxiety and depression 05/31/2013   Intermediate coronary syndrome ([place]) 12/22/2012   Unstable angina ([place]) 12/16/2012   Fatigue 04/26/2012   Annual physical exam 10/28/2011   Rash 10/28/2011   Coronary Artery Disease 08/28/2011   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   Palpitations 08/28/2011   NSTEMI (non-ST elevated myocardial infarction) ([place]) 08/07/2011   DJD (degenerative joint disease) 05/28/2011   Poorly controlled type 2 diabetes mellitus with circulatory disorder ([place])    Hyperlipidemia    HTN (hypertension)     Immunization History  Administered Date(s) Administered   Fluad Quad(high Dose 65+) 05/04/2019   Influenza Split 06/30/2011   Influenza, High Dose Seasonal PF 05/31/2013, 05/27/2017, 06/07/2018   Influenza, Seasonal, Injecte,  Preservative Fre 07/28/2012   Influenza,inj,Quad PF,6+ Mos 05/29/2014, 06/07/2015, 04/23/2016   Influenza-Unspecified 05/24/2020   PFIZER(Purple Top)SARS-COV-2 Vaccination 10/11/2019, 11/01/2019, 05/20/2020   Pneumococcal Conjugate-13 12/05/2014   Pneumococcal Polysaccharide-23 08/07/2011, 10/12/2018   Tdap 05/31/2013   Zoster, Live 11/28/2012    Conditions to be addressed/monitored: CAD, HTN, HLD, DMII, Anxiety, Depression  and B12 deficiency; iron def anemia; ;essential tremor; peripheral neuropathy; hypomagnesemia; h/o gastric bypass surgery; h/o lower GI bleed and GERD  Care Plan : General Pharmacy (Adult)  Updates made by [name], PHARMD since 05/26/2021 12:00 AM     Problem: Management of Chronic Conditions: Hypertension, Hyperlipidemia, Heart Disease, Diabetes, Depression, GERD/History of GI Bleed, Essential Tremor, Hypomagnesemia, neuropathy   Priority: High  Onset Date: 11/25/2020  Note:   Current Barriers:  Unable to achieve control of type 2 DM and HTN  Not filling medications on time despite using [facility] packaging system and delivery - improving Depression not controlled - PCP changed medication 04/17/2021 - improving   Pharmacist Clinical Goal(s):  Over the next 90 days, patient will achieve adherence to monitoring guidelines and medication adherence to achieve therapeutic efficacy achieve control of diabetes as evidenced by A1c <8.0 and BP <140/90 adhere to prescribed medication regimen as evidenced by refill history  through collaboration with PharmD and provider.   Interventions: 1:1 collaboration with Colon Branch, MD regarding development and update of comprehensive plan of care as evidenced by provider attestation and co-signature Inter-disciplinary care team collaboration (see longitudinal plan of care) Comprehensive medication review performed; medication list updated in electronic medical record  Diabetes: Managed by Endo - Dr Kelton Pillar Uncontrolled -  last A1c was 8.4% (goal is A1c < 8.0%) Current treatment:  Novolin 70/30 18 units with morning meal and 14 units with evening meal Metformin 1031m twice daily Has tried Trulicity in past - caused nausea (also has h/o gastric bypass surgery and lower GI bleed). Tried jardiance and invokana - Jadiance stopped due to cost and Invokana was non formulary; Lantus taken in past but stopped due to cost.  Dr MDomenic Politementioned trial of SGLT2 during his last office visit 04/2021.  Current glucose readings: Patient states he has not been checking BG regularly.  Was prescribed DEXCom CGM per Dr SKelton Pillarbut patient reports his insuarnce declined because he was not injecting insulin 3 or more times per day.  No recent hypoglycemia Diet - reports he has not bee as vigilant with his diet recently. Has been eating more sweets like cookies   Current exercise: walking some, renovating home, yardwork, working in his shop Interventions:  Reviewed home blood glucose readings and reviewed goals  Fasting blood glucose goal (before meals) = 80 to 130 Blood glucose goal after a meal = less than 180  Reminded patient to check blood glucose  2 to 3 times per day Consider SGLT2 in future, though cost has been issue in past. He might qualify for FIranpatient assistance.   Hypertension: Uncontrolled but improving. BP Readings from Last 3 Encounters:  05/12/21 (!) 144/80  04/24/21 (!) 147/62  04/17/21 (!) 144/72  Current treatment: Carvedilol 12.558mtwice daily Losartan 5036maily Current home readings: patient has not started checking at home as recommended by PCP Interventions:  Counseled on checking BP at home to get better idea of BP outside of office.  Recommended If BP continues to be above goal; consider increaseing losartan to 100m68mily or add HCTZ to regimen Coordinated with pharmacy to see if could determine reason for not needing refills on all meds in June. No reason identified. Discussed  adherence with patient and wife.   Hyperlipidemia / CAD: Controlled;  Current treatment:  Aspirin 81mg52mly Atorvastatin 80mg 55my Nitroglycerin 0.4mg as59meded Interventions:  Recommended continue current therapy Discussed adherence to maintenance medications - per refill history it looks like patient might have  received double fill of atorvastatin in October 2021 - got 64 DS of atorvastatin on 06/08/20 from [facility] and a 60DS on 06/11/2020 from [facility]. He did refill atorvastatin for 90 days 11/11/2020 and then again 03/21/2021.  Will continue to follow adherence and intervene as needed.   Depression/Anxiety/: Uncontrolled but improved with change from citalopram to fluoxetine 04/17/2021 Current treatment: Fluoxetine 33m - take 2 tablets = 454mdaily (started at 2027maily for 3 weeks)  PHQ9 SCORE ONLY 05/26/2021 04/17/2021 10/03/2020  PHQ-9 Total Score 3 6 0  Patient reports he has more energy and has more interests in hobbies and getting out since switch from citalopram to fluoxetine.  Interventions  Recommended continue current therapy Coordinated with pharmacy to get enough fluoxetine delivered to last until syn date for all maintenance medications at the end of October.   Neuropathy Controlled Current regimen:  OTC B12 1000m50maily  Interventions: Consider repeat B12 in 3 to 6 months Discussed that he should consider balance therapy as recommended by Dr [name] (neuro)  Hypomagnesemia Current regimen:  Magnesium oxide 400mg87mce daily Interventions: Consider repeat Magnesium level at next office visit  Health Maintenance  Interventions: Recommended patient complete Shingrix vaccine series at pharmacy. Received 1st Shingrix 04/17/2021 (2nd due between 06/17/2021 to 10/18/2021) Recommended getting annual flu vaccine and COVID booster this month.  Discussed diet and exercise Continue to be active daily either walking, yardwork Limit snacking and serving sizes.    Medication management Current pharmacy: [facility]  Interventions Comprehensive medication review performed. Utilize [facility] pharmacy for medication synchronization, packaging and delivery Reviewed adherence and contacted pharmacy to verify fills. Discussed adherence with patient and his wife Adherence strategies to try Place morning and evening medications by toothbrush to remember to take Drink a glass of water every morning and evening and place medications by sink in kitchen to improve adherence Set an alarm / reminder on your phone for morning and evening medication doses.   Patient Goals/Self-Care Activities Over the next 90 days, patient will:  take medications as prescribed check glucose 2 to 3 times a day, document, and provide at future appointments,  check blood pressure 1 to 2 times per week, document, and provide at future appointments engage in dietary modifications by decreasing intake of high sugar / CHO snacks and limiting serving sizes  Follow Up Plan: Telephone follow up appointment with care management team member scheduled for:  3 months       Medication Assistance: None required.  Patient affirms current coverage meets needs.  Patient's preferred pharmacy is:  [facility] - [place] [id number]: [phone]: [phone] [facility] (New Address) - [place]: [address] [place] [place] [id number]: [phone] pill box? No - using packaging from [facility] Adherence is about 90% - working with patient and his  wife to increase adherence.    Follow Up:  Patient agrees to Care Plan and Follow-up.  Plan: Telephone follow up appointment with care management team member scheduled for:  [facility] adherence; 3 months  with clinical pharmacist.   [name], PharmD Clinical Pharmacist [facility] [phone]

## 2021-06-02 ENCOUNTER — Ambulatory Visit (INDEPENDENT_AMBULATORY_CARE_PROVIDER_SITE_OTHER): Payer: PPO

## 2021-06-02 VITALS — Ht 74.0 in | Wt 219.0 lb

## 2021-06-02 DIAGNOSIS — Z0000 Encounter for general adult medical examination without abnormal findings: Secondary | ICD-10-CM

## 2021-06-02 NOTE — Patient Instructions (Signed)
Randy Castro , Thank you for taking time to complete your Medicare Wellness Visit. I appreciate your ongoing commitment to your health goals. Please review the following plan we discussed and let me know if I can assist you in the future.   Screening recommendations/referrals: Colonoscopy: Completed 12/26/2019. Repeat every 3-5 years Recommended yearly ophthalmology/optometry visit for glaucoma screening and checkup Recommended yearly dental visit for hygiene and checkup  Vaccinations: Influenza vaccine: Due-May obtain vaccine at our office or your local pharmacy. Pneumococcal vaccine: Up to date-Due 06/01/2023 Tdap vaccine: Up to date-Due- Shingles vaccine: Completed first dose. Please bring vaccination dates to your next appt.   Covid-19: Booster available at the pharmacy.  Advanced directives: Information mailed today.  Conditions/risks identified: See problem list  Next appointment: Follow up in one year for your annual wellness visit. 06/04/2022 @ 11:00  Preventive Care 65 Years and Older, Male Preventive care refers to lifestyle choices and visits with your health care provider that can promote health and wellness. What does preventive care include? A yearly physical exam. This is also called an annual well check. Dental exams once or twice a year. Routine eye exams. Ask your health care provider how often you should have your eyes checked. Personal lifestyle choices, including: Daily care of your teeth and gums. Regular physical activity. Eating a healthy diet. Avoiding tobacco and drug use. Limiting alcohol use. Practicing safe sex. Taking low doses of aspirin every day. Taking vitamin and mineral supplements as recommended by your health care provider. What happens during an annual well check? The services and screenings done by your health care provider during your annual well check will depend on your age, overall health, lifestyle risk factors, and family history of  disease. Counseling  Your health care provider may ask you questions about your: Alcohol use. Tobacco use. Drug use. Emotional well-being. Home and relationship well-being. Sexual activity. Eating habits. History of falls. Memory and ability to understand (cognition). Work and work Statistician. Screening  You may have the following tests or measurements: Height, weight, and BMI. Blood pressure. Lipid and cholesterol levels. These may be checked every 5 years, or more frequently if you are over 56 years old. Skin check. Lung cancer screening. You may have this screening every year starting at age 8 if you have a 30-pack-year history of smoking and currently smoke or have quit within the past 15 years. Fecal occult blood test (FOBT) of the stool. You may have this test every year starting at age 98. Flexible sigmoidoscopy or colonoscopy. You may have a sigmoidoscopy every 5 years or a colonoscopy every 10 years starting at age 57. Prostate cancer screening. Recommendations will vary depending on your family history and other risks. Hepatitis C blood test. Hepatitis B blood test. Sexually transmitted disease (STD) testing. Diabetes screening. This is done by checking your blood sugar (glucose) after you have not eaten for a while (fasting). You may have this done every 1-3 years. Abdominal aortic aneurysm (AAA) screening. You may need this if you are a current or former smoker. Osteoporosis. You may be screened starting at age 87 if you are at high risk. Talk with your health care provider about your test results, treatment options, and if necessary, the need for more tests. Vaccines  Your health care provider may recommend certain vaccines, such as: Influenza vaccine. This is recommended every year. Tetanus, diphtheria, and acellular pertussis (Tdap, Td) vaccine. You may need a Td booster every 10 years. Zoster vaccine. You may need this  after age 43. Pneumococcal 13-valent  conjugate (PCV13) vaccine. One dose is recommended after age 81. Pneumococcal polysaccharide (PPSV23) vaccine. One dose is recommended after age 48. Talk to your health care provider about which screenings and vaccines you need and how often you need them. This information is not intended to replace advice given to you by your health care provider. Make sure you discuss any questions you have with your health care provider. Document Released: 09/06/2015 Document Revised: 04/29/2016 Document Reviewed: 06/11/2015 Elsevier Interactive Patient Education  2017 [employer] Prevention in the Home Falls can cause injuries. They can happen to people of all ages. There are many things you can do to make your home safe and to help prevent falls. What can I do on the outside of my home? Regularly fix the edges of walkways and driveways and fix any cracks. Remove anything that might make you trip as you walk through a door, such as a raised step or threshold. Trim any bushes or trees on the path to your home. Use bright outdoor lighting. Clear any walking paths of anything that might make someone trip, such as rocks or tools. Regularly check to see if handrails are loose or broken. Make sure that both sides of any steps have handrails. Any raised decks and porches should have guardrails on the edges. Have any leaves, snow, or ice cleared regularly. Use sand or salt on walking paths during winter. Clean up any spills in your garage right away. This includes oil or grease spills. What can I do in the bathroom? Use night lights. Install grab bars by the toilet and in the tub and shower. Do not use towel bars as grab bars. Use non-skid mats or decals in the tub or shower. If you need to sit down in the shower, use a plastic, non-slip stool. Keep the floor dry. Clean up any water that spills on the floor as soon as it happens. Remove soap buildup in the tub or shower regularly. Attach bath mats  securely with double-sided non-slip rug tape. Do not have throw rugs and other things on the floor that can make you trip. What can I do in the bedroom? Use night lights. Make sure that you have a light by your bed that is easy to reach. Do not use any sheets or blankets that are too big for your bed. They should not hang down onto the floor. Have a firm chair that has side arms. You can use this for support while you get dressed. Do not have throw rugs and other things on the floor that can make you trip. What can I do in the kitchen? Clean up any spills right away. Avoid walking on wet floors. Keep items that you use a lot in easy-to-reach places. If you need to reach something above you, use a strong step stool that has a grab bar. Keep electrical cords out of the way. Do not use floor polish or wax that makes floors slippery. If you must use wax, use non-skid floor wax. Do not have throw rugs and other things on the floor that can make you trip. What can I do with my stairs? Do not leave any items on the stairs. Make sure that there are handrails on both sides of the stairs and use them. Fix handrails that are broken or loose. Make sure that handrails are as long as the stairways. Check any carpeting to make sure that it is firmly attached to the  stairs. Fix any carpet that is loose or worn. Avoid having throw rugs at the top or bottom of the stairs. If you do have throw rugs, attach them to the floor with carpet tape. Make sure that you have a light switch at the top of the stairs and the bottom of the stairs. If you do not have them, ask someone to add them for you. What else can I do to help prevent falls? Wear shoes that: Do not have high heels. Have rubber bottoms. Are comfortable and fit you well. Are closed at the toe. Do not wear sandals. If you use a stepladder: Make sure that it is fully opened. Do not climb a closed stepladder. Make sure that both sides of the stepladder  are locked into place. Ask someone to hold it for you, if possible. Clearly mark and make sure that you can see: Any grab bars or handrails. First and last steps. Where the edge of each step is. Use tools that help you move around (mobility aids) if they are needed. These include: Canes. Walkers. Scooters. Crutches. Turn on the lights when you go into a dark area. Replace any light bulbs as soon as they burn out. Set up your furniture so you have a clear path. Avoid moving your furniture around. If any of your floors are uneven, fix them. If there are any pets around you, be aware of where they are. Review your medicines with your doctor. Some medicines can make you feel dizzy. This can increase your chance of falling. Ask your doctor what other things that you can do to help prevent falls. This information is not intended to replace advice given to you by your health care provider. Make sure you discuss any questions you have with your health care provider. Document Released: 06/06/2009 Document Revised: 01/16/2016 Document Reviewed: 09/14/2014 Elsevier Interactive Patient Education  2017 [employer].

## 2021-06-02 NOTE — Progress Notes (Addendum)
Subjective:   Randy Stead. is a 70 y.o. male who presents for Medicare Annual/Subsequent preventive examination.  I connected with Gurjot today by telephone and verified that I am speaking with the correct person using two identifiers. Location patient: home Location provider: work Persons participating in the virtual visit: patient, Marine scientist.    I discussed the limitations, risks, security and privacy concerns of performing an evaluation and management service by telephone and the availability of in person appointments. I also discussed with the patient that there may be a patient responsible charge related to this service. The patient expressed understanding and verbally consented to this telephonic visit.    Interactive audio and video telecommunications were attempted between this provider and patient, however failed, due to patient having technical difficulties OR patient did not have access to video capability.  We continued and completed visit with audio only.  Some vital signs may be absent or patient reported.   Time Spent with patient on telephone encounter: 20 minutes   Review of Systems     Cardiac Risk Factors include: advanced age (>56mn, >>42women);male gender;diabetes mellitus;dyslipidemia;hypertension     Objective:    Today's Vitals   06/02/21 1142  Weight: 219 lb (99.3 kg)  Height: 6' 2"  (1.88 m)   Body mass index is 28.12 kg/m.  Advanced Directives 06/02/2021 10/22/2020 04/22/2020 11/06/2019 05/04/2019 01/20/2019 04/28/2018  Does Patient Have a Medical Advance Directive? No No No No No No No  Would patient like information on creating a medical advance directive? Yes (MAU/Ambulatory/Procedural Areas - Information given) - - - No - Patient declined No - Patient declined Yes (MAU/Ambulatory/Procedural Areas - Information given)  Pre-existing out of facility DNR order (yellow form or pink MOST form) - - - - - - -    Current Medications (verified) Outpatient  Encounter Medications as of 06/02/2021  Medication Sig   aspirin EC 81 MG tablet Take 81 mg by mouth daily.   atorvastatin (LIPITOR) 80 MG tablet TAKE 1 TABLET(80 MG) BY MOUTH AT BEDTIME   Blood Glucose Monitoring Suppl (ONE TOUCH ULTRA 2) w/Device KIT by Does not apply route.   carvedilol (COREG) 12.5 MG tablet TAKE 1 TABLET(12.5 MG) BY MOUTH TWICE DAILY WITH A MEAL   clotrimazole-betamethasone (LOTRISONE) cream Apply topically 2 (two) times daily as needed.   Continuous Blood Gluc Receiver (DEXCOM G6 RECEIVER) DEVI 1 Device by Does not apply route as directed.   Continuous Blood Gluc Transmit (DEXCOM G6 TRANSMITTER) MISC 1 Device by Does not apply route as directed.   FEROSUL 325 (65 Fe) MG tablet TAKE 1 TABLET BY MOUTH DAILY   FLUoxetine (PROZAC) 20 MG capsule 1 daily for 3 weeks, then 2 tablets daily (Patient taking differently: Take 40 mg by mouth daily. 1 daily for 3 weeks, then 2 tablets daily)   glucose blood (ONETOUCH ULTRA) test strip Check blood sugar 2-3 times daily.  Dx Code: E11.9 (Patient taking differently: Check blood sugar 2-3 times daily.  Dx Code: E11.9)   losartan (COZAAR) 50 MG tablet Take 1 tablet (50 mg total) by mouth at bedtime.   magnesium oxide (MAG-OX) 400 MG tablet SMARTSIG:1 Tablet(s) By Mouth Morning-Night   magnesium oxide (MAGNESIUM-OXIDE) 400 (241.3 Mg) MG tablet Take 1 tablet (400 mg total) by mouth 2 (two) times daily.   metFORMIN (GLUCOPHAGE) 1000 MG tablet Take 1 tablet (1,000 mg total) by mouth 2 (two) times daily with a meal.   nitroGLYCERIN (NITROSTAT) 0.4 MG SL tablet Place 1  tablet (0.4 mg total) under the tongue every 5 (five) minutes as needed for chest pain.   NOVOLIN 70/30 FLEXPEN (70-30) 100 UNIT/ML KwikPen Inject 18 Units into the skin in the morning and at bedtime.   pantoprazole (PROTONIX) 40 MG tablet TAKE 1 TABLET(40 MG) BY MOUTH TWICE DAILY BEFORE A MEAL   Potassium 99 MG TABS Take 1 tablet by mouth daily.   primidone (MYSOLINE) 50 MG tablet  TAKE 3 TABLETS BY MOUTH EVERY MORNING AND 2 TABLETS BY MOUTH EVERY EVENING   vitamin B-12 (CYANOCOBALAMIN) 1000 MCG tablet Take 1,000 mcg by mouth daily.   Zoster Vaccine Adjuvanted [facility]) injection Inject into the muscle as directed   No facility-administered encounter medications on file as of 06/02/2021.    Allergies (verified) Trulicity [dulaglutide]   History: Past Medical History:  Diagnosis Date   Anxiety    Arthritis    BCC (basal cell carcinoma of skin) 12/2020   CAD (coronary artery disease)    a. NSTEMI 12/12: EF 40-45%. VVO:HYWVPXT balloon PTCA + Promus DES x 1 to mid LAD;  b. 11/2012: Cath with Cutting balloon PTCA  LAD for ISR to LAD, EF 55%;  c. 12/2012 Cath/PCI: LAD stents patent, 80 ost Diag (jailed)->PTCA, LCX/RCA patent.   Essential hypertension    GI bleed    History of blood transfusion    was getting 4 units   Hyperlipidemia    Ischemic cardiomyopathy    a.  echo 08/06/11: dist ant wall, apical and septal and infero-apical HK, mild LVH, EF 40%, mild LAE, PASP 34, asc aorta mildly dilated, mild TR;  b.  Echo (3/13) showed recovery of LV systolic function with EF 06-26%, grade I diastolic dysfunction, mild MR.    Myocardial infarction [facility])    twice with NSTEMI 2012   Palpitations    Type 2 diabetes mellitus [facility])    Past Surgical History:  Procedure Laterality Date   ANTERIOR CERVICAL DECOMP/DISCECTOMY FUSION  in the 90s   CHOLECYSTECTOMY  03/27/2019   COLONOSCOPY  2021   COLONOSCOPY WITH ESOPHAGOGASTRODUODENOSCOPY (EGD)  10/2019   CORONARY ANGIOPLASTY  12/15/2012; 12/22/2012   CORONARY ANGIOPLASTY WITH STENT PLACEMENT  07/2011   "1" (12/22/2012)   GASTRIC BYPASS  12/21/2017   KNEE ARTHROSCOPY Left 12/31/2016   LEFT HEART CATHETERIZATION WITH CORONARY ANGIOGRAM N/A 08/06/2011   Procedure: LEFT HEART CATHETERIZATION WITH CORONARY ANGIOGRAM;  Surgeon: Burnell Blanks, MD;  Location: [facility] CATH LAB;  Service: Cardiovascular;  Laterality: N/A;   LEFT  HEART CATHETERIZATION WITH CORONARY ANGIOGRAM N/A 12/15/2012   Procedure: LEFT HEART CATHETERIZATION WITH CORONARY ANGIOGRAM;  Surgeon: Sherren Mocha, MD;  Location: [facility] CATH LAB;  Service: Cardiovascular;  Laterality: N/A;   LEFT HEART CATHETERIZATION WITH CORONARY ANGIOGRAM N/A 12/22/2012   Procedure: LEFT HEART CATHETERIZATION WITH CORONARY ANGIOGRAM;  Surgeon: Sherren Mocha, MD;  Location: [facility] CATH LAB;  Service: Cardiovascular;  Laterality: N/A;   PERCUTANEOUS CORONARY STENT INTERVENTION (PCI-S) Right 12/15/2012   Procedure: PERCUTANEOUS CORONARY STENT INTERVENTION (PCI-S);  Surgeon: Sherren Mocha, MD;  Location: [facility] CATH LAB;  Service: Cardiovascular;  Laterality: Right;   Family History  Problem Relation Age of Onset   Diabetes Brother    Lung cancer Brother    Diabetes Mother    Coronary artery disease Father    Heart attack Father 46   Diabetes Sister    Coronary artery disease Sister    Colon cancer Neg Hx    Prostate cancer Neg Hx    Stroke Neg Hx  Esophageal cancer Neg Hx    Colon polyps Neg Hx    Rectal cancer Neg Hx    Stomach cancer Neg Hx    Social History   Socioeconomic History   Marital status: Married    Spouse name: Not on file   Number of children: 2    Years of education: Not on file   Highest education level: Not on file  Occupational History   Occupation: retired--maintenance tech  Tobacco Use   Smoking status: Former    Packs/day: 1.00    Years: 25.00    Pack years: 25.00    Types: Cigarettes    Quit date: 04/12/1992    Years since quitting: 29.1   Smokeless tobacco: Never  Vaping Use   Vaping Use: Never used  Substance and Sexual Activity   Alcohol use: Yes    Comment: 12/22/2012 "rarely maybe every 3-4 months, beer"   Drug use: No   Sexual activity: Yes  Other Topics Concern   Not on file  Social History Narrative   Moved back to [place] from [place]-- pt and wife   Hobby: Dietitian   Social Determinants of Health    Financial Resource Strain: Low Risk    Difficulty of Paying Living Expenses: Not hard at all  Food Insecurity: No Food Insecurity   Worried About Charity fundraiser in the Last Year: Never true   Arboriculturist in the Last Year: Never true  Transportation Needs: No Transportation Needs   Lack of Transportation (Medical): No   Lack of Transportation (Non-Medical): No  Physical Activity: Insufficiently Active   Days of Exercise per Week: 3 days   Minutes of Exercise per Session: 30 min  Stress: No Stress Concern Present   Feeling of Stress : Not at all  Social Connections: Moderately Integrated   Frequency of Communication with Friends and Family: More than three times a week   Frequency of Social Gatherings with Friends and Family: More than three times a week   Attends Religious Services: More than 4 times per year   Active Member of [employer] or Organizations: No   Attends Music therapist: Never   Marital Status: Married    Tobacco Counseling Counseling given: Not Answered   Clinical Intake:  Pre-visit preparation completed: Yes  Pain : No/denies pain     BMI - recorded: 28.12 Nutritional Status: BMI 25 -29 Overweight Nutritional Risks: None Diabetes: Yes CBG done?: No Did pt. bring in CBG monitor from home?: No (phone visit)  How often do you need to have someone help you when you read instructions, pamphlets, or other written materials from your doctor or pharmacy?: 1 - Never  Diabetes:  Is the patient diabetic?  Yes  If diabetic, was a CBG obtained today?  No  Did the patient bring in their glucometer from home?  No phone visit How often do you monitor your CBG's? daily.   Financial Strains and Diabetes Management:  Are you having any financial strains with the device, your supplies or your medication? No .  Does the patient want to be seen by Chronic Care Management for management of their diabetes?  No  Would the patient like to be referred  to a Nutritionist or for Diabetic Management?  No   Diabetic Exams:  Diabetic Eye Exam: . Overdue for diabetic eye exam. Pt has been advised about the importance in completing this exam.Patient has an upcoming appt.  Diabetic  Foot Exam: Completed 07/02/2020.   Interpreter Needed?: No  Information entered by :: Caroleen Hamman LPN   Activities of Daily Living In your present state of health, do you have any difficulty performing the following activities: 06/02/2021 07/02/2020  Hearing? N N  Vision? N N  Difficulty concentrating or making decisions? N N  Walking or climbing stairs? N N  Dressing or bathing? N N  Doing errands, shopping? N N  Preparing Food and eating ? N -  Using the Toilet? N -  In the past six months, have you accidently leaked urine? N -  Do you have problems with loss of bowel control? N -  Managing your Medications? N -  Managing your Finances? N -  Housekeeping or managing your Housekeeping? N -  Some recent data might be hidden    Patient Care Team: Colon Branch, MD as PCP - General (Internal Medicine) Satira Sark, MD as PCP - Cardiology (Cardiology) Larey Dresser, MD as Consulting Physician (Cardiology) Lake Bells., MD as Referring Physician (Gastroenterology) Gearlean Alf, PA-C as Physician Assistant (Pulmonary Disease) Demetrius Revel, MD as Consulting Physician (Bariatrics) Younts, Javier Glazier, NP as Nurse Practitioner (Endocrinology) [name], [name], DO as Consulting Physician (Neurology) [name], PharmD (Pharmacist)  Indicate any recent Medical Services you may have received from other than [facility] providers in the past year (date may be approximate).     Assessment:   This is a routine wellness examination for Xzayvier.  Hearing/Vision screen Hearing Screening - Comments:: No issues Vision Screening - Comments:: Last eye exam-several years ago-Has an upcoming appt-[facility]  Dietary issues and exercise activities  discussed: Current Exercise Habits: Home exercise routine, Type of exercise: walking, Time (Minutes): 30, Frequency (Times/Week): 3, Weekly Exercise (Minutes/Week): 90, Intensity: Mild, Exercise limited by: None identified   Goals Addressed             This Visit's Progress    Increase physical activity   Not on track    Exercise daily        Depression Screen PHQ 2/9 Scores 06/02/2021 05/26/2021 04/17/2021 10/03/2020 05/27/2020 05/04/2019 10/12/2018  PHQ - 2 Score 0 1 2 0 0 0 0  PHQ- 9 Score - 3 6 0 0 - 0  Exception Documentation - - - - - - -    Fall Risk Fall Risk  06/02/2021 04/24/2021 04/17/2021 10/22/2020 10/03/2020  Falls in the past year? 0 0 0 1 0  Number falls in past yr: 0 0 0 1 0  Injury with Fall? 0 0 0 0 0  Follow up Falls prevention discussed - Falls evaluation completed - -    FALL RISK PREVENTION PERTAINING TO THE HOME:  Any stairs in or around the home? Yes  If so, are there any without handrails? No  Home free of loose throw rugs in walkways, pet beds, electrical cords, etc? Yes  Adequate lighting in your home to reduce risk of falls? Yes   ASSISTIVE DEVICES UTILIZED TO PREVENT FALLS:  Life alert? No  Use of a cane, walker or w/c? No  Grab bars in the bathroom? Yes  Shower chair or bench in shower? No  Elevated toilet seat or a handicapped toilet? No   TIMED UP AND GO:  Was the test performed? No . Phone visit   Cognitive Function:Normal cognitive status assessed by this Nurse Health Advisor. No abnormalities found.   MMSE - Mini Mental State Exam 04/27/2017  Orientation to time  5  Orientation to Place 5  Registration 3  Attention/ Calculation 5  Recall 3  Language- name 2 objects 2  Language- repeat 1  Language- follow 3 step command 3  Language- read & follow direction 1  Write a sentence 1  Copy design 1  Total score 30        Immunizations Immunization History  Administered Date(s) Administered   Fluad Quad(high Dose 65+) 05/04/2019    Influenza Split 06/30/2011   Influenza, High Dose Seasonal PF 05/31/2013, 05/27/2017, 06/07/2018   Influenza, Seasonal, Injecte, Preservative Fre 07/28/2012   Influenza,inj,Quad PF,6+ Mos 05/29/2014, 06/07/2015, 04/23/2016   Influenza-Unspecified 05/24/2020   PFIZER(Purple Top)SARS-COV-2 Vaccination 10/11/2019, 11/01/2019, 05/20/2020   Pneumococcal Conjugate-13 12/05/2014   Pneumococcal Polysaccharide-23 08/07/2011, 10/12/2018   Tdap 05/31/2013   Zoster, Live 11/28/2012    TDAP status: Up to date  Flu Vaccine status: Due, Education has been provided regarding the importance of this vaccine. Advised may receive this vaccine at local pharmacy or Health Dept. Aware to provide a copy of the vaccination record if obtained from local pharmacy or Health Dept. Verbalized acceptance and understanding.  Pneumococcal vaccine status: Up to date  Covid-19 vaccine status: Information provided on how to obtain vaccines. Booster due  Qualifies for Shingles Vaccine? Yes   Zostavax completed Yes   Shingrix Completed?: No.    Education has been provided regarding the importance of this vaccine. Patient has been advised to call insurance company to determine out of pocket expense if they have not yet received this vaccine. Advised may also receive vaccine at local pharmacy or Health Dept. Verbalized acceptance and understanding.  Screening Tests Health Maintenance  Topic Date Due   Zoster Vaccines- Shingrix (1 of 2) Never done   OPHTHALMOLOGY EXAM  07/18/2020   COVID-19 Vaccine (4 - Booster for Pfizer series) 08/12/2020   INFLUENZA VACCINE  03/24/2021   FOOT EXAM  07/02/2021   HEMOGLOBIN A1C  07/10/2021   COLONOSCOPY (Pts 45-3yr Insurance coverage will need to be confirmed)  12/26/2022   TETANUS/TDAP  06/01/2023   Hepatitis C Screening  Completed   HPV VACCINES  Aged Out    Health Maintenance  Health Maintenance Due  Topic Date Due   Zoster Vaccines- Shingrix (1 of 2) Never done    OPHTHALMOLOGY EXAM  07/18/2020   COVID-19 Vaccine (4 - Booster for Pfizer series) 08/12/2020   INFLUENZA VACCINE  03/24/2021    Colorectal cancer screening: Type of screening: Colonoscopy. Completed 12/26/2019. Repeat every 3 years  Lung Cancer Screening: (Low Dose CT Chest recommended if Age 665-80years, 30 pack-year currently smoking OR have quit w/in 15years.) does not qualify.     Additional Screening:  Hepatitis C Screening: Completed 06/07/2015  Vision Screening: Recommended annual ophthalmology exams for early detection of glaucoma and other disorders of the eye. Is the patient up to date with their annual eye exam?  No  Who is the provider or what is the name of the office in which the patient attends annual eye exams? [facility]   Dental Screening: Recommended annual dental exams for proper oral hygiene  Community Resource Referral / Chronic Care Management: CRR required this visit?  No   CCM required this visit?  No      Plan:     I have personally reviewed and noted the following in the patient's chart:   Medical and social history Use of alcohol, tobacco or illicit drugs  Current medications and supplements including opioid prescriptions. Patient is  not currently taking opioid prescriptions. Functional ability and status Nutritional status Physical activity Advanced directives List of other physicians Hospitalizations, surgeries, and ER visits in previous 12 months Vitals Screenings to include cognitive, depression, and falls Referrals and appointments  In addition, I have reviewed and discussed with patient certain preventive protocols, quality metrics, and best practice recommendations. A written personalized care plan for preventive services as well as general preventive health recommendations were provided to patient.   Due to this being a telephonic visit, the after visit summary with patients personalized plan was offered to patient via mail or  my-chart. Patient would like to access on my-chart.   Marta Antu, LPN   [id number]  Nurse Health Advisor  Nurse Notes: None  I have reviewed and agree with Health Coaches documentation.  Kathlene November, MD

## 2021-06-16 ENCOUNTER — Other Ambulatory Visit: Payer: Self-pay | Admitting: Internal Medicine

## 2021-06-16 ENCOUNTER — Other Ambulatory Visit: Payer: Self-pay | Admitting: Neurology

## 2021-06-16 DIAGNOSIS — G250 Essential tremor: Secondary | ICD-10-CM

## 2021-06-18 LAB — HM DIABETES EYE EXAM

## 2021-06-23 DIAGNOSIS — E7800 Pure hypercholesterolemia, unspecified: Secondary | ICD-10-CM | POA: Diagnosis not present

## 2021-06-23 DIAGNOSIS — E118 Type 2 diabetes mellitus with unspecified complications: Secondary | ICD-10-CM | POA: Diagnosis not present

## 2021-06-23 DIAGNOSIS — I10 Essential (primary) hypertension: Secondary | ICD-10-CM

## 2021-06-23 DIAGNOSIS — F419 Anxiety disorder, unspecified: Secondary | ICD-10-CM

## 2021-06-23 DIAGNOSIS — F32A Depression, unspecified: Secondary | ICD-10-CM | POA: Diagnosis not present

## 2021-06-25 ENCOUNTER — Telehealth: Payer: Self-pay | Admitting: Pharmacist

## 2021-06-25 NOTE — Chronic Care Management (AMB) (Signed)
Chronic Care Management Pharmacy Assistant   Name: Irwin Toran.  MRN: [id number] DOB: 28-May-1951  Reason for Encounter: General Adherence  Recent office visits:  06/02/21-Martha A. Dorothyann Peng, LPN.  Medicare Annual/Subsequent preventive examination.  Recent consult visits:  None noted  Hospital visits:  None in previous 6 months  Medications: Outpatient Encounter Medications as of 06/25/2021  Medication Sig Note   aspirin EC 81 MG tablet Take 81 mg by mouth daily.    atorvastatin (LIPITOR) 80 MG tablet TAKE ONE TABLET BY MOUTH EVERYDAY AT BEDTIME    Blood Glucose Monitoring Suppl (ONE TOUCH ULTRA 2) w/Device KIT by Does not apply route.    carvedilol (COREG) 12.5 MG tablet TAKE ONE TABLET BY MOUTH AT BREAKFAST AND AT BEDTIME    clotrimazole-betamethasone (LOTRISONE) cream Apply topically 2 (two) times daily as needed.    Continuous Blood Gluc Receiver (DEXCOM G6 RECEIVER) DEVI 1 Device by Does not apply route as directed.    Continuous Blood Gluc Transmit (DEXCOM G6 TRANSMITTER) MISC 1 Device by Does not apply route as directed.    FEROSUL 325 (65 Fe) MG tablet TAKE 1 TABLET BY MOUTH DAILY    FLUoxetine (PROZAC) 20 MG capsule 1 daily for 3 weeks, then 2 tablets daily (Patient taking differently: Take 40 mg by mouth daily. 1 daily for 3 weeks, then 2 tablets daily)    glucose blood (ONETOUCH ULTRA) test strip Check blood sugar 2-3 times daily.  Dx Code: E11.9 (Patient taking differently: Check blood sugar 2-3 times daily.  Dx Code: E11.9)    losartan (COZAAR) 50 MG tablet TAKE ONE TABLET BY MOUTH EVERYDAY AT BEDTIME    magnesium oxide (MAG-OX) 400 MG tablet TAKE ONE TABLET BY MOUTH AT BREAKFAST AND AT BEDTIME    metFORMIN (GLUCOPHAGE) 1000 MG tablet TAKE ONE TABLET BY MOUTH AT BREAKFAST AND AT BEDTIME    nitroGLYCERIN (NITROSTAT) 0.4 MG SL tablet Place 1 tablet (0.4 mg total) under the tongue every 5 (five) minutes as needed for chest pain. 04/17/2021: PRN   NOVOLIN 70/30 FLEXPEN  (70-30) 100 UNIT/ML KwikPen Inject 18 Units into the skin in the morning and at bedtime. 02/25/2021: Taking 18 units with morning meal and 14 units with evening meal.    pantoprazole (PROTONIX) 40 MG tablet TAKE ONE TABLET BY MOUTH AT BREAKFAST AND AT BEDTIME    Potassium 99 MG TABS Take 1 tablet by mouth daily.    primidone (MYSOLINE) 50 MG tablet TAKE THREE TABLETS BY MOUTH EVERY MORNING and TAKE TWO TABLETS BY MOUTH EVERYDAY AT BEDTIME    vitamin B-12 (CYANOCOBALAMIN) 1000 MCG tablet Take 1,000 mcg by mouth daily.    Zoster Vaccine Adjuvanted [facility]) injection Inject into the muscle as directed    No facility-administered encounter medications on file as of 06/25/2021.   Have you had any problems recently with your health? Patient states he has been doing very well no concerns at the moment.  Have you had any problems with your pharmacy? Patient states he has no problems with his pharmacy.   What issues or side effects are you having with your medications? Patient states he has no issues or side effects to any of his medications.  What would you like me to pass along to [employer] ,CPP for them to help you with?  Patient states there is nothing at this time.  What can we do to take care of you better? Patient states there is nothing at this time.   Star  Rating Drugs: Losartan 50 mg Last filled:06/17/21 30 DS Atorvastatin 80 mg Last filled:06/17/21 30 DS  Myriam Elta [place], [place]

## 2021-06-26 ENCOUNTER — Encounter: Payer: Self-pay | Admitting: Internal Medicine

## 2021-06-26 DIAGNOSIS — Z85828 Personal history of other malignant neoplasm of skin: Secondary | ICD-10-CM | POA: Diagnosis not present

## 2021-06-26 DIAGNOSIS — D225 Melanocytic nevi of trunk: Secondary | ICD-10-CM | POA: Diagnosis not present

## 2021-06-26 DIAGNOSIS — L905 Scar conditions and fibrosis of skin: Secondary | ICD-10-CM | POA: Diagnosis not present

## 2021-06-26 DIAGNOSIS — L821 Other seborrheic keratosis: Secondary | ICD-10-CM | POA: Diagnosis not present

## 2021-06-26 DIAGNOSIS — L570 Actinic keratosis: Secondary | ICD-10-CM | POA: Diagnosis not present

## 2021-06-26 DIAGNOSIS — D485 Neoplasm of uncertain behavior of skin: Secondary | ICD-10-CM | POA: Diagnosis not present

## 2021-06-29 ENCOUNTER — Other Ambulatory Visit: Payer: Self-pay | Admitting: Internal Medicine

## 2021-07-11 ENCOUNTER — Telehealth: Payer: Self-pay | Admitting: Pharmacist

## 2021-07-11 NOTE — Chronic Care Management (AMB) (Signed)
Chronic Care Management Pharmacy Assistant   Name: Randy Castro.  MRN: [id number] DOB: 03-Dec-1950  Reason for Encounter: Medication coordination   Medications: Outpatient Encounter Medications as of 07/11/2021  Medication Sig Note   aspirin EC 81 MG tablet Take 81 mg by mouth daily.    atorvastatin (LIPITOR) 80 MG tablet TAKE ONE TABLET BY MOUTH EVERYDAY AT BEDTIME    Blood Glucose Monitoring Suppl (ONE TOUCH ULTRA 2) w/Device KIT by Does not apply route.    carvedilol (COREG) 12.5 MG tablet TAKE ONE TABLET BY MOUTH AT BREAKFAST AND AT BEDTIME    clotrimazole-betamethasone (LOTRISONE) cream Apply topically 2 (two) times daily as needed.    Continuous Blood Gluc Receiver (DEXCOM G6 RECEIVER) DEVI 1 Device by Does not apply route as directed.    Continuous Blood Gluc Transmit (DEXCOM G6 TRANSMITTER) MISC 1 Device by Does not apply route as directed.    FEROSUL 325 (65 Fe) MG tablet TAKE 1 TABLET BY MOUTH DAILY    FLUoxetine (PROZAC) 20 MG capsule 1 daily for 3 weeks, then 2 tablets daily (Patient taking differently: Take 40 mg by mouth daily. 1 daily for 3 weeks, then 2 tablets daily)    glucose blood (ONETOUCH ULTRA) test strip Check blood sugar 2-3 times daily.  Dx Code: E11.9 (Patient taking differently: Check blood sugar 2-3 times daily.  Dx Code: E11.9)    losartan (COZAAR) 50 MG tablet TAKE ONE TABLET BY MOUTH EVERYDAY AT BEDTIME    magnesium oxide (MAG-OX) 400 MG tablet TAKE ONE TABLET BY MOUTH AT BREAKFAST AND AT BEDTIME    metFORMIN (GLUCOPHAGE) 1000 MG tablet TAKE ONE TABLET BY MOUTH AT BREAKFAST AND AT BEDTIME    nitroGLYCERIN (NITROSTAT) 0.4 MG SL tablet Place 1 tablet (0.4 mg total) under the tongue every 5 (five) minutes as needed for chest pain. 04/17/2021: PRN   NOVOLIN 70/30 KWIKPEN (70-30) 100 UNIT/ML KwikPen INJECT 18 UNITS into THE SKIN TWICE DAILY WITH A MEAL    pantoprazole (PROTONIX) 40 MG tablet TAKE ONE TABLET BY MOUTH AT BREAKFAST AND AT BEDTIME    Potassium 99  MG TABS Take 1 tablet by mouth daily.    primidone (MYSOLINE) 50 MG tablet TAKE THREE TABLETS BY MOUTH EVERY MORNING and TAKE TWO TABLETS BY MOUTH EVERYDAY AT BEDTIME    vitamin B-12 (CYANOCOBALAMIN) 1000 MCG tablet Take 1,000 mcg by mouth daily.    Zoster Vaccine Adjuvanted [facility]) injection Inject into the muscle as directed    No facility-administered encounter medications on file as of 07/11/2021.    Reviewed chart for medication changes ahead of medication coordination call.  No OVs, Consults, or hospital visits since last care coordination call/Pharmacist visit. (If appropriate, list visit date, provider name)  No medication changes indicated OR if recent visit, treatment plan here.  BP Readings from Last 3 Encounters:  05/12/21 (!) 144/80  04/24/21 (!) 147/62  04/17/21 (!) 144/72    Lab Results  Component Value Date   HGBA1C 8.4 (A) 01/07/2021     Patient obtains medications through Adherence Packaging  30 Days   Last adherence delivery included: None   Patient declined (meds) last month due to PRN use/additional supply on hand. Atorvastatin 80 mg tab with Breakfast Carvedilol 12.5 mg tab with Breakfast, one tat Bedtime Citalopram 20 mg tab with Breakfast clotrimazole-betamethasone cream  Losartan 50 mg tab at Bedtime Magnesium Oxide 400 mg one tab with Breakfast, one at Bedtime Metformin 1000 mg tab with Breakfast, one at  Bedtime Pantoprazole 40 mg tab with Breakfast, one at Bedtime Primidone 50 mg tab; three tabs with Breakfast, two tabs at Bedtime. Explanation of abundance on hand (ie #30 due to overlapping fills or previous adherence issues etc)  Patient is due for next adherence delivery on: 07/23/21. Called patient and reviewed medications and coordinated delivery.  This delivery to include: Mag Oxide 400 mg twice daily Atorvastatin 80 mg once at bed time Primidone 50 mg five times daily Losartan potassium 50 mg once at bedtime  Metformin HCL 1,000 mg  twice daily Carvedilol 12.5 mg twice daily Pantoprazole 40 mg twice daily Fluoxetine 20 mg twice daily Novolin 70-30 inject 18 unites twice daily  Confirmed delivery date of 07/23/21, advised patient that pharmacy will contact them the morning of delivery.  Confirmed medications with patients wife who takes care of all his medications.   Corrie Mckusick, [place]

## 2021-07-15 ENCOUNTER — Other Ambulatory Visit: Payer: Self-pay

## 2021-07-15 ENCOUNTER — Ambulatory Visit: Payer: PPO | Admitting: Internal Medicine

## 2021-07-15 VITALS — BP 174/90 | HR 56 | Ht 74.0 in | Wt 221.2 lb

## 2021-07-15 DIAGNOSIS — Z794 Long term (current) use of insulin: Secondary | ICD-10-CM

## 2021-07-15 DIAGNOSIS — E1140 Type 2 diabetes mellitus with diabetic neuropathy, unspecified: Secondary | ICD-10-CM | POA: Diagnosis not present

## 2021-07-15 DIAGNOSIS — E1159 Type 2 diabetes mellitus with other circulatory complications: Secondary | ICD-10-CM | POA: Diagnosis not present

## 2021-07-15 DIAGNOSIS — E1142 Type 2 diabetes mellitus with diabetic polyneuropathy: Secondary | ICD-10-CM

## 2021-07-15 DIAGNOSIS — E1165 Type 2 diabetes mellitus with hyperglycemia: Secondary | ICD-10-CM

## 2021-07-15 LAB — POCT GLYCOSYLATED HEMOGLOBIN (HGB A1C): Hemoglobin A1C: 9.1 % — AB (ref 4.0–5.6)

## 2021-07-15 LAB — GLUCOSE, POCT (MANUAL RESULT ENTRY): POC Glucose: 227 mg/dl — AB (ref 70–99)

## 2021-07-15 MED ORDER — DAPAGLIFLOZIN PROPANEDIOL 5 MG PO TABS: 5.0000 mg | ORAL_TABLET | Freq: Every day | ORAL | 1 refills | Status: DC

## 2021-07-15 NOTE — Progress Notes (Signed)
Name: Randy Castro.  Age/ Sex: 70 y.o., male   MRN/ DOB: [id number], 04-Sep-1950     PCP: Colon Branch, MD   Reason for Endocrinology Evaluation: Type 2 Diabetes Mellitus  Initial Endocrine Consultative Visit: 04/30/2020    PATIENT IDENTIFIER: [name]. is a 70 y.o. male with a past medical history of T2DM, CAD, Dyslipidemia and HTN. The patient has followed with Endocrinology clinic since 04/30/2020 for consultative assistance with management of his diabetes.  DIABETIC HISTORY:  [name] was diagnosed with DM in 1998 , he has been on Janumet,  Glimepiride and Trulicity with reported N/V with Trulicity.Was put on insulin in 2012 following admission for NSTEMI . His hemoglobin A1c has ranged from 7.3% in 2013, peaking at 13.4% in 2019   Pt was established with Dr. Cruzita Lederer from 2013 to 2014   On his initial visit to our clinic his A1c was 9.7 % . We continued Metformin and changed NPH insulin to insulin Mix    Was unable to get the dexcom 06/2021  SUBJECTIVE:   During the last visit (09/03/2020): A1c 8.7 % . We increased insulin mix and continued metformin      Today (07/15/2021): [name] is here for a follow up on diabetes.  He checks his blood sugars 0 times a day . The patient has had hypoglycemic episodes since the last clinic visit, he did not bring his meter today.     Was seen by cardiology 05/12/2021 Saw Dr. Carles Collet ( neurology )04/2021 for essential tremors   Admits to dietary indiscretions for the past month   Denies nausea, vomiting and diarrhea    HOME DIABETES REGIMEN:  Metformin 1000 mg BID  Novolin Mix 18 units before breakfast and 14 units before supper - takes Novolin Mix 18 units BID     Statin: yes ACE-I/ARB: yes    METER DOWNLOAD SUMMARY: Did not bring      DIABETIC COMPLICATIONS: Microvascular complications:  Neuropathy Denies: CKD, retinopathy Last Eye Exam: Completed 08/2020  Macrovascular complications:  CAD (S/P PCI )   Denies: CVA, PVD   HISTORY:  Past Medical History:  Past Medical History:  Diagnosis Date   Anxiety    Arthritis    BCC (basal cell carcinoma of skin) 12/2020   CAD (coronary artery disease)    a. NSTEMI 12/12: EF 40-45%. GUR:[id number] balloon PTCA + Promus DES x 1 to mid LAD;  b. 11/2012: Cath with Cutting balloon PTCA  LAD for ISR to LAD, EF 55%;  c. 12/2012 Cath/PCI: LAD stents patent, 80 ost Diag (jailed)->PTCA, LCX/RCA patent.   Essential hypertension    GI bleed    History of blood transfusion    was getting 4 units   Hyperlipidemia    Ischemic cardiomyopathy    a.  echo 08/06/11: dist ant wall, apical and septal and infero-apical HK, mild LVH, EF 40%, mild LAE, PASP 34, asc aorta mildly dilated, mild TR;  b.  Echo (3/13) showed recovery of LV systolic function with EF 76-28%, grade I diastolic dysfunction, mild MR.    Myocardial infarction [facility])    twice with NSTEMI 2012   Palpitations    Type 2 diabetes mellitus [facility])    Past Surgical History:  Past Surgical History:  Procedure Laterality Date   ANTERIOR CERVICAL DECOMP/DISCECTOMY FUSION  in the 90s   CHOLECYSTECTOMY  03/27/2019   COLONOSCOPY  2021   COLONOSCOPY WITH ESOPHAGOGASTRODUODENOSCOPY (EGD)  10/2019  CORONARY ANGIOPLASTY  12/15/2012; 12/22/2012   CORONARY ANGIOPLASTY WITH STENT PLACEMENT  07/2011   "1" (12/22/2012)   GASTRIC BYPASS  12/21/2017   KNEE ARTHROSCOPY Left 12/31/2016   LEFT HEART CATHETERIZATION WITH CORONARY ANGIOGRAM N/A 08/06/2011   Procedure: LEFT HEART CATHETERIZATION WITH CORONARY ANGIOGRAM;  Surgeon: Burnell Blanks, MD;  Location[facility] CATH LAB;  Service: Cardiovascular;  Laterality: N/A;   LEFT HEART CATHETERIZATION WITH CORONARY ANGIOGRAM N/A 12/15/2012   Procedure: LEFT HEART CATHETERIZATION WITH CORONARY ANGIOGRAM;  Surgeon: Sherren Mocha, MD;  Location: [facility] CATH LAB;  Service: Cardiovascular;  Laterality: N/A;   LEFT HEART CATHETERIZATION WITH CORONARY ANGIOGRAM N/A 12/22/2012    Procedure: LEFT HEART CATHETERIZATION WITH CORONARY ANGIOGRAM;  Surgeon: Sherren Mocha, MD;  Location: [facility] CATH LAB;  Service: Cardiovascular;  Laterality: N/A;   PERCUTANEOUS CORONARY STENT INTERVENTION (PCI-S) Right 12/15/2012   Procedure: PERCUTANEOUS CORONARY STENT INTERVENTION (PCI-S);  Surgeon: Sherren Mocha, MD;  Location: [facility] CATH LAB;  Service: Cardiovascular;  Laterality: Right;   Social History:  reports that he quit smoking about 29 years ago. His smoking use included cigarettes. He has a 25.00 pack-year smoking history. He has never used smokeless tobacco. He reports current alcohol use. He reports that he does not use drugs. Family History:  Family History  Problem Relation Age of Onset   Diabetes Brother    Lung cancer Brother    Diabetes Mother    Coronary artery disease Father    Heart attack Father 38   Diabetes Sister    Coronary artery disease Sister    Colon cancer Neg Hx    Prostate cancer Neg Hx    Stroke Neg Hx    Esophageal cancer Neg Hx    Colon polyps Neg Hx    Rectal cancer Neg Hx    Stomach cancer Neg Hx      HOME MEDICATIONS: Allergies as of 07/15/2021       Reactions   Trulicity [dulaglutide] Nausea And Vomiting        Medication List        Accurate as of July 15, 2021  9:30 AM. If you have any questions, ask your nurse or doctor.          aspirin EC 81 MG tablet Take 81 mg by mouth daily.   atorvastatin 80 MG tablet Commonly known as: LIPITOR TAKE ONE TABLET BY MOUTH EVERYDAY AT BEDTIME   carvedilol 12.5 MG tablet Commonly known as: COREG TAKE ONE TABLET BY MOUTH AT BREAKFAST AND AT BEDTIME   clotrimazole-betamethasone cream Commonly known as: LOTRISONE Apply topically 2 (two) times daily as needed.   Dexcom G6 Receiver Devi 1 Device by Does not apply route as directed.   Dexcom G6 Transmitter Misc 1 Device by Does not apply route as directed.   FeroSul 325 (65 FE) MG tablet Generic drug: ferrous sulfate TAKE 1  TABLET BY MOUTH DAILY   FLUoxetine 20 MG capsule Commonly known as: PROzac 1 daily for 3 weeks, then 2 tablets daily What changed:  how much to take how to take this when to take this   losartan 50 MG tablet Commonly known as: COZAAR TAKE ONE TABLET BY MOUTH EVERYDAY AT BEDTIME   magnesium oxide 400 MG tablet Commonly known as: MAG-OX TAKE ONE TABLET BY MOUTH AT BREAKFAST AND AT BEDTIME   metFORMIN 1000 MG tablet Commonly known as: GLUCOPHAGE TAKE ONE TABLET BY MOUTH AT BREAKFAST AND AT BEDTIME   nitroGLYCERIN 0.4 MG SL tablet Commonly known as:  NITROSTAT Place 1 tablet (0.4 mg total) under the tongue every 5 (five) minutes as needed for chest pain.   NovoLIN 70/30 Kwikpen (70-30) 100 UNIT/ML KwikPen Generic drug: insulin isophane & regular human KwikPen INJECT 18 UNITS into THE SKIN TWICE DAILY WITH A MEAL   ONE TOUCH ULTRA 2 w/Device Kit by Does not apply route.   OneTouch Ultra test strip Generic drug: glucose blood Check blood sugar 2-3 times daily.  Dx Code: E11.9   pantoprazole 40 MG tablet Commonly known as: PROTONIX TAKE ONE TABLET BY MOUTH AT BREAKFAST AND AT BEDTIME   Potassium 99 MG Tabs Take 1 tablet by mouth daily.   primidone 50 MG tablet Commonly known as: MYSOLINE TAKE THREE TABLETS BY MOUTH EVERY MORNING and TAKE TWO TABLETS BY MOUTH EVERYDAY AT BEDTIME   Shingrix injection Generic drug: Zoster Vaccine Adjuvanted Inject into the muscle as directed   vitamin B-12 1000 MCG tablet Commonly known as: CYANOCOBALAMIN Take 1,000 mcg by mouth daily.         OBJECTIVE:   Vital Signs: BP (!) 174/90 (BP Location: Left Arm, Patient Position: Sitting, Cuff Size: Normal)   Pulse (!) 56   Ht _0  (1.88 m)   Wt 221 lb 3.2 oz (100.3 kg)   SpO2 98%   BMI 28.40 kg/m   Wt Readings from Last 3 Encounters:  07/15/21 221 lb 3.2 oz (100.3 kg)  06/02/21 219 lb (99.3 kg)  05/12/21 219 lb 6.4 oz (99.5 kg)     Exam: General: Pt appears well and is  in NAD  Lungs: Clear with good BS bilat with no rales, rhonchi, or wheezes  Heart: RRR   Extremities: No pretibial edema   Neuro: MS is good with appropriate affect, pt is alert and Ox3       DM foot exam:07/15/2021   The skin of the feet is without sores or ulcerations, 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 The sensation is decreased to a screening 5.07, 10 gram monofilament bilaterally  DATA REVIEWED:  Lab Results  Component Value Date   HGBA1C 9.1 (A) 07/15/2021   HGBA1C 8.4 (A) 01/07/2021   HGBA1C 8.7 (A) 09/03/2020   Lab Results  Component Value Date   MICROALBUR 72.9 (H) 01/07/2021   LDLCALC 46 04/17/2021   CREATININE 0.64 04/17/2021   Lab Results  Component Value Date   MICRALBCREAT 67.3 (H) 01/07/2021     Lab Results  Component Value Date   CHOL 110 04/17/2021   HDL 43.80 04/17/2021   LDLCALC 46 04/17/2021   LDLDIRECT 145.0 04/26/2018   TRIG 102.0 04/17/2021   CHOLHDL 3 04/17/2021       Results for RAYN, ENDERSON (MRN [id number]) as of 01/07/2021 14:17  Ref. Range 01/07/2021 09:36  Creatinine,U Latest Units: mg/dL 108.4  Microalb, Ur Latest Ref Range: 0.0 - 1.9 mg/dL 72.9 (H)  MICROALB/CREAT RATIO Latest Ref Range: 0.0 - 30.0 mg/g 67.3 (H)    In-Office BG 223 mg/dL  ASSESSMENT / PLAN / RECOMMENDATIONS:   1) Type 2 Diabetes Mellitus, With improving glucose control, With neuropathic, and macrovascular  complications and microalbuminuria  - Most recent A1c of 9.1%. Goal A1c < 7.0 %.     - Pt continues with dietary indiscretions , he also tends to forget to take insulin at times ( such as this morning ) , he also takes it postprandial .  - I  have advised him to take it 20-30 minutes before a meal  -- Unable to obtain dexcom  - PT assistance forms provided   MEDICATIONS: - Continue Metformin 1000 mg, 1 tablet with Breakfast and Supper - Change  Novolin Mix 70/30 20 units before  breakfast and 18 units before  Supper  - Start Farxiga 5 mg daily   EDUCATION / INSTRUCTIONS: BG monitoring instructions: Patient is instructed to check his blood sugars 2 times a day, before breakfast and Supper . Call [place] [facility] clinic if: BG persistently < 70 I reviewed the Rule of 15 for the treatment of hypoglycemia in detail with the patient. Literature supplied.     2) Diabetic complications:  Eye: Does not have known diabetic retinopathy.  Neuro/ Feet: Does  have known diabetic peripheral neuropathy  Renal: Patient does not have known baseline CKD, but has microalbuminuria. He is  on an ACEI/ARB at present.      F/U in 4 months    Signed electronically by: Mack Guise, MD  [facility] Endocrinology  [facility] [address] [place], [place] [address] Phone: [phone] FAX: [phone]   CC: Colon Branch, [address] [place] [address] Phone: [phone]  Fax: [phone]  Return to Endocrinology clinic as below: Future Appointments  Date Time Provider [place]  07/23/2021  9:00 AM Colon Branch, MD [facility]  08/29/2021 11:00 AM [facility] C[facility] PHARMACIST [facility]  11/20/2021  9:20 AM Satira Sark, MD [facility]  04/28/2022 10:45 AM [name], [name], DO [facility] None  06/04/2022 11:00 AM [facility] HEALTH COACH [facility]

## 2021-07-15 NOTE — Patient Instructions (Signed)
-   Continue Metformin 1000 mg, 1 tablet with Breakfast and Supper - Change Novolin Mix 70/30  20 units before Breakfast and 18 units before Supper  - Start Farxiga 5 mg, 1 tablet every morning       HOW TO TREAT LOW BLOOD SUGARS (Blood sugar LESS THAN 70 MG/DL) Please follow the RULE OF 15 for the treatment of hypoglycemia treatment (when your (blood sugars are less than 70 mg/dL)   STEP 1: Take 15 grams of carbohydrates when your blood sugar is low, which includes:  3-4 GLUCOSE TABS  OR 3-4 OZ OF JUICE OR REGULAR SODA OR ONE TUBE OF GLUCOSE GEL    STEP 2: RECHECK blood sugar in 15 MINUTES STEP 3: If your blood sugar is still low at the 15 minute recheck --> then, go back to STEP 1 and treat AGAIN with another 15 grams of carbohydrates.

## 2021-07-21 ENCOUNTER — Ambulatory Visit: Payer: PPO | Admitting: Internal Medicine

## 2021-07-23 ENCOUNTER — Encounter: Payer: Self-pay | Admitting: Internal Medicine

## 2021-07-23 ENCOUNTER — Ambulatory Visit (INDEPENDENT_AMBULATORY_CARE_PROVIDER_SITE_OTHER): Payer: PPO | Admitting: Internal Medicine

## 2021-07-23 VITALS — BP 122/64 | HR 64 | Temp 97.8°F | Resp 18 | Ht 74.0 in | Wt 219.1 lb

## 2021-07-23 DIAGNOSIS — E7800 Pure hypercholesterolemia, unspecified: Secondary | ICD-10-CM | POA: Diagnosis not present

## 2021-07-23 DIAGNOSIS — E118 Type 2 diabetes mellitus with unspecified complications: Secondary | ICD-10-CM

## 2021-07-23 DIAGNOSIS — F419 Anxiety disorder, unspecified: Secondary | ICD-10-CM

## 2021-07-23 DIAGNOSIS — E538 Deficiency of other specified B group vitamins: Secondary | ICD-10-CM | POA: Diagnosis not present

## 2021-07-23 DIAGNOSIS — F32A Depression, unspecified: Secondary | ICD-10-CM

## 2021-07-23 LAB — HEPATIC FUNCTION PANEL
ALT: 37 U/L (ref 0–53)
AST: 26 U/L (ref 0–37)
Albumin: 3.9 g/dL (ref 3.5–5.2)
Alkaline Phosphatase: 60 U/L (ref 39–117)
Bilirubin, Direct: 0.1 mg/dL (ref 0.0–0.3)
Total Bilirubin: 0.5 mg/dL (ref 0.2–1.2)
Total Protein: 5.9 g/dL — ABNORMAL LOW (ref 6.0–8.3)

## 2021-07-23 LAB — B12 AND FOLATE PANEL
Folate: >23.4 ng/mL (ref >5.9)
Vitamin B-12: 611 pg/mL (ref 211–911)

## 2021-07-23 MED ORDER — FLUOXETINE HCL 40 MG PO CAPS: 40.0000 mg | ORAL_CAPSULE | Freq: Every day | ORAL | 10 refills | Status: DC

## 2021-07-23 NOTE — Progress Notes (Signed)
Subjective:    Patient ID: Randy Foots., male    DOB: July 28, 1951, 70 y.o.   MRN: [id number]  DOS:  07/23/2021 Type of visit - description: Routine checkup  Since the last office visit he is feeling well and has no major concerns. Emotionally is better, denies depression, anxiety under much better control, sleeping well. Ambulatory BPs are checked rarely, they are typically normal.   Review of Systems Denies chest pain, no difficulty breathing. No lower extremity edema  Past Medical History:  Diagnosis Date   Anxiety    Arthritis    BCC (basal cell carcinoma of skin) 12/2020   CAD (coronary artery disease)    a. NSTEMI 12/12: EF 40-45%. [id number] balloon PTCA + Promus DES x 1 to mid LAD;  b. 11/2012: Cath with Cutting balloon PTCA  LAD for ISR to LAD, EF 55%;  c. 12/2012 Cath/PCI: LAD stents patent, 80 ost Diag (jailed)->PTCA, LCX/RCA patent.   Essential hypertension    GI bleed    History of blood transfusion    was getting 4 units   Hyperlipidemia    Ischemic cardiomyopathy    a.  echo 08/06/11: dist ant wall, apical and septal and infero-apical HK, mild LVH, EF 40%, mild LAE, PASP 34, asc aorta mildly dilated, mild TR;  b.  Echo (3/13) showed recovery of LV systolic function with EF 26-71%, grade I diastolic dysfunction, mild MR.    Myocardial infarction [facility])    twice with NSTEMI 2012   Palpitations    Type 2 diabetes mellitus [facility])     Past Surgical History:  Procedure Laterality Date   ANTERIOR CERVICAL DECOMP/DISCECTOMY FUSION  in the 90s   CHOLECYSTECTOMY  03/27/2019   COLONOSCOPY  2021   COLONOSCOPY WITH ESOPHAGOGASTRODUODENOSCOPY (EGD)  10/2019   CORONARY ANGIOPLASTY  12/15/2012; 12/22/2012   CORONARY ANGIOPLASTY WITH STENT PLACEMENT  07/2011   "1" (12/22/2012)   GASTRIC BYPASS  12/21/2017   KNEE ARTHROSCOPY Left 12/31/2016   LEFT HEART CATHETERIZATION WITH CORONARY ANGIOGRAM N/A 08/06/2011   Procedure: LEFT HEART CATHETERIZATION WITH CORONARY ANGIOGRAM;   Surgeon: Burnell Blanks, MD;  Location: [facility] CATH LAB;  Service: Cardiovascular;  Laterality: N/A;   LEFT HEART CATHETERIZATION WITH CORONARY ANGIOGRAM N/A 12/15/2012   Procedure: LEFT HEART CATHETERIZATION WITH CORONARY ANGIOGRAM;  Surgeon: Sherren Mocha, MD;  Location: [facility] CATH LAB;  Service: Cardiovascular;  Laterality: N/A;   LEFT HEART CATHETERIZATION WITH CORONARY ANGIOGRAM N/A 12/22/2012   Procedure: LEFT HEART CATHETERIZATION WITH CORONARY ANGIOGRAM;  Surgeon: Sherren Mocha, MD;  Location: [facility] CATH LAB;  Service: Cardiovascular;  Laterality: N/A;   PERCUTANEOUS CORONARY STENT INTERVENTION (PCI-S) Right 12/15/2012   Procedure: PERCUTANEOUS CORONARY STENT INTERVENTION (PCI-S);  Surgeon: Sherren Mocha, MD;  Location: [facility] CATH LAB;  Service: Cardiovascular;  Laterality: Right;    Allergies as of 07/23/2021       Reactions   Trulicity [dulaglutide] Nausea And Vomiting        Medication List        Accurate as of July 23, 2021  5:55 PM. If you have any questions, ask your nurse or doctor.          STOP taking these medications    Shingrix injection Generic drug: Zoster Vaccine Adjuvanted Stopped by: Kathlene November, MD       TAKE these medications    aspirin EC 81 MG tablet Take 81 mg by mouth daily.   atorvastatin 80 MG tablet Commonly known as: LIPITOR TAKE ONE TABLET BY  MOUTH EVERYDAY AT BEDTIME   carvedilol 12.5 MG tablet Commonly known as: COREG TAKE ONE TABLET BY MOUTH AT BREAKFAST AND AT BEDTIME   clotrimazole-betamethasone cream Commonly known as: LOTRISONE Apply topically 2 (two) times daily as needed.   dapagliflozin propanediol 5 MG Tabs tablet Commonly known as: Farxiga Take 1 tablet (5 mg total) by mouth daily before breakfast.   Dexcom G6 Receiver Devi 1 Device by Does not apply route as directed.   Dexcom G6 Transmitter Misc 1 Device by Does not apply route as directed.   FeroSul 325 (65 FE) MG tablet Generic drug: ferrous sulfate TAKE  1 TABLET BY MOUTH DAILY   FLUoxetine 40 MG capsule Commonly known as: PROZAC Take 1 capsule (40 mg total) by mouth daily. What changed:  medication strength how much to take how to take this when to take this additional instructions Changed by: Kathlene November, MD   losartan 50 MG tablet Commonly known as: COZAAR TAKE ONE TABLET BY MOUTH EVERYDAY AT BEDTIME   magnesium oxide 400 MG tablet Commonly known as: MAG-OX TAKE ONE TABLET BY MOUTH AT BREAKFAST AND AT BEDTIME   metFORMIN 1000 MG tablet Commonly known as: GLUCOPHAGE TAKE ONE TABLET BY MOUTH AT BREAKFAST AND AT BEDTIME   nitroGLYCERIN 0.4 MG SL tablet Commonly known as: NITROSTAT Place 1 tablet (0.4 mg total) under the tongue every 5 (five) minutes as needed for chest pain.   NovoLIN 70/30 Kwikpen (70-30) 100 UNIT/ML KwikPen Generic drug: insulin isophane & regular human KwikPen INJECT 18 UNITS into THE SKIN TWICE DAILY WITH A MEAL   ONE TOUCH ULTRA 2 w/Device Kit by Does not apply route.   OneTouch Ultra test strip Generic drug: glucose blood Check blood sugar 2-3 times daily.  Dx Code: E11.9   pantoprazole 40 MG tablet Commonly known as: PROTONIX TAKE ONE TABLET BY MOUTH AT BREAKFAST AND AT BEDTIME   Potassium 99 MG Tabs Take 1 tablet by mouth daily.   primidone 50 MG tablet Commonly known as: MYSOLINE TAKE THREE TABLETS BY MOUTH EVERY MORNING and TAKE TWO TABLETS BY MOUTH EVERYDAY AT BEDTIME   vitamin B-12 1000 MCG tablet Commonly known as: CYANOCOBALAMIN Take 1,000 mcg by mouth daily.           Objective:   Physical Exam BP 122/64 (BP Location: Left Arm, Patient Position: Sitting, Cuff Size: Normal)   Pulse 64   Temp 97.8 F (36.6 C) (Oral)   Resp 18   Ht _0  (1.88 m)   Wt 219 lb 2 oz (99.4 kg)   SpO2 97%   BMI 28.13 kg/m  General:   Well developed, NAD, BMI noted. HEENT:  Normocephalic . Face symmetric, atraumatic Lungs:  CTA B Normal respiratory effort, no intercostal retractions, no  accessory muscle use. Heart: RRR,  no murmur.  Lower extremities: no pretibial edema bilaterally  Skin: Not pale. Not jaundice Neurologic:  alert & oriented X3.  Speech normal, gait appropriate for age and unassisted Psych--  Cognition and judgment appear intact.  Cooperative with normal attention span and concentration.  Behavior appropriate. No anxious or depressed appearing.      Assessment     Assessment   DM : insulin dependent, uncontrolled, w/ CAD CHF. Intol Trulicity, used to see [facility] until 08/2019 Neuropathy, severe, documented by [facility] 2022 HTN Hyperlipidemia CAD, CHF, ischemic cardiomyopathy, PVCs (Holter 2019 show 13.6% burden) NSTEMI in 12/12 (DES to LAD), EF 40%, unstable angina in 4/14 and 5/14 .  Repeat echo in  3/13 showed EF 55-60%. 4/14 >> unstable angina and had 90% pLAD in-stent restenosis (Rxcutting balloon angioplasty).  Readmitted, again with unstable angina, in 5/14.  This time he had PTCA to 90% ostial D1 stenosis.   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 Hyperlipidemia: LDL few months ago 46.  Continue atorvastatin.  Check LFTs. Neuropathy: Causing no problems, no recent falls. B12 deficiency: On oral supplements, labs. Anxiety depression: See LOV, citalopram changed to fluoxetine, currently on 40 mg, feeling really well.  RF sent. Preventive care: had a flu and covid vax RTC CPX 3-4 months     This visit occurred during the SARS-CoV-2 public health emergency.  Safety protocols were in place, including screening questions prior to the visit, additional usage of staff PPE, and extensive cleaning of exam room while observing  appropriate contact time as indicated for disinfecting solutions.

## 2021-07-23 NOTE — Assessment & Plan Note (Signed)
DM:  saw endo 07-15-2021, A1c 9.1 HTN: BP today is very good, recommend to check once or twice a month, continue carvedilol, losartan. Hyperlipidemia: LDL few months ago 46.  Continue atorvastatin.  Check LFTs. Neuropathy: Causing no problems, no recent falls. B12 deficiency: On oral supplements, labs. Anxiety depression: See LOV, citalopram changed to fluoxetine, currently on 40 mg, feeling really well.  RF sent. Preventive care: had a flu and covid vax RTC CPX 3-4 months

## 2021-07-23 NOTE — Patient Instructions (Addendum)
Check the  blood pressure once or twice a month.   BP GOAL is between 110/65 and  135/85. If it is consistently higher or lower, let me know  Continue fluoxetine 40 mg daily.  I sent a new prescription  GO TO THE LAB : Get the blood work     [place] back for a physical exam in 3 to 4 months

## 2021-07-25 ENCOUNTER — Ambulatory Visit (INDEPENDENT_AMBULATORY_CARE_PROVIDER_SITE_OTHER): Payer: PPO | Admitting: Pharmacist

## 2021-07-25 DIAGNOSIS — F32A Depression, unspecified: Secondary | ICD-10-CM

## 2021-07-25 DIAGNOSIS — F419 Anxiety disorder, unspecified: Secondary | ICD-10-CM

## 2021-07-25 DIAGNOSIS — E7800 Pure hypercholesterolemia, unspecified: Secondary | ICD-10-CM

## 2021-07-25 DIAGNOSIS — E538 Deficiency of other specified B group vitamins: Secondary | ICD-10-CM

## 2021-07-25 DIAGNOSIS — Z794 Long term (current) use of insulin: Secondary | ICD-10-CM

## 2021-07-25 DIAGNOSIS — I10 Essential (primary) hypertension: Secondary | ICD-10-CM

## 2021-07-25 DIAGNOSIS — E1165 Type 2 diabetes mellitus with hyperglycemia: Secondary | ICD-10-CM

## 2021-07-25 NOTE — Patient Instructions (Signed)
Mr. Chisenhall It was a pleasure speaking with you today.  I have attached a summary of our visit today and information about your health goals.   Our next appointment is by telephone on January 6th at 11:00am  Please call the care guide team at [phone] if you need to cancel or reschedule your appointment.    If you have any questions or concerns, please feel free to contact me either at the phone number below or with a MyChart message.    [name], PharmD Clinical Pharmacist [facility] [place] [facility] [phone] (direct line)  [phone] (main office number)   Patient verbalizes understanding of instructions provided today and agrees to view in [place].     Hypertension BP Readings from Last 3 Encounters:  07/23/21 122/64  07/15/21 (!) 174/90  05/12/21 (!) 144/80   Pharmacist Clinical Goal(s): Over the next 90 days, patient will work with PharmD and providers to achieve BP goal <140/90 Current regimen:  Carvedilol 12.5mg  twice daily Losartan 50mg  daily Interventions: Requested patient check his blood pressure 1 to 2 times per week and record Patient self care activities - Over the next 90 days, patient will: Check blood pressure 1 to 2  times per week, document, and provide at future appointments Ensure daily salt intake < 2300 mg/day Important to take medications for blood pressure EVERY day to lower blood pressure to recommended goal.  Hyperlipidemia/CAD Lab Results  Component Value Date/Time   LDLCALC 46 04/17/2021 09:19 AM   LDLDIRECT 145.0 04/26/2018 10:26 AM   Pharmacist Clinical Goal(s): Over the next 90 days, patient will work with PharmD and providers to maintain LDL goal < 70  Current regimen:  Aspirin 81mg  daily Atorvastatin 80mg  daily Nitroglycerin 0.4mg  as needed Patient self care activities - Over the next 90 days, patient will: Maintain cholesterol medication regimen.   Diabetes Lab Results  Component Value Date/Time    HGBA1C 9.1 (A) 07/15/2021 09:29 AM   HGBA1C 8.4 (A) 01/07/2021 09:17 AM   HGBA1C 9.7 (H) 04/01/2020 09:10 AM   HGBA1C 7.7 09/12/2019 12:00 AM   HGBA1C 8.2 04/20/2019 12:00 AM   Pharmacist Clinical Goal(s): Over the next 90 days, patient will work with PharmD and providers to achieve A1c goal <8% while preventing increase episodes of hypoglycemia in coordination with patient's endocrinologist Current regimen:  Novolin 70/30 18 units with breakfast and 18 units with evening meal Metformin 1000mg  twice daily Farxiga 5mg  daily (has not started yet) Interventions: Discussed diet and exercise Reviewed home blood glucose readings and reviewed goals  Fasting blood glucose goal (before meals) = 80 to 130 Blood glucose goal after a meal = less than 180 Reminded to check BG 2 to 3 times per day per Dr Kelton Pillar.  Patient self care activities - Over the next 90 days, patient will: Check blood sugar twice daily, document, and provide at future appointments Contact provider with any episodes of hypoglycemia Decreased sweet snacks like banana pudding and cookies; instead try smaller servings size (1/2 cup) or lower carbohydrate snack (popcorn, veggies, low fat cheese) Call [facility] to discuss changing to Diabetes and heart Care plan which I think will make all your medication costs lower and Farxiga affordable.  Call me once you decide and we can start [place] or discuss other options with Dr Kelton Pillar.   Neuropathy Pharmacist Clinical Goal(s) Over the next 90 days, patient will work with PharmD and providers to maintain magnesium within normal limits and decrease neuropathy Current regimen:  OTC B12 1051mcg daily  Interventions: Consider repeat B12 in 6 to 12 months Consider balance therapy as recommended by Dr Carles Collet (neurologies) Patient self care activities - Over the next 90 days, patient will: Continue B12 1047mcg daily Complete B12 level in 6 to 12 months Recommend balance  therapy to prevent future falls.   Hypomagnesemia Pharmacist Clinical Goal(s) Over the next 90 days, patient will work with PharmD and providers to maintain magnesium within normal limits Current regimen:  Magnesium oxide 400mg  twice daily Interventions: Consider repeat Magnesium level at next office visit Patient self care activities - Over the next 90 days, patient will: Maintain magnesium medication regimen Complete magnesium level at next office visit  Health Maintenance  Pharmacist Clinical Goal(s) Over the next 90 days, patient will work with PharmD and providers to complete health maintenance screenings/vaccinations Interventions: Discussed diet and exercise Patient self care activities - Over the next 90 days, patient will: Continue to be active daily either walking, yardwork Limit snacking and serving sizes.    Medication management Pharmacist Clinical Goal(s): Over the next 90 days, patient will work with PharmD and providers to maintain optimal medication adherence Current pharmacy: [facility] Interventions Comprehensive medication review performed. Utilize [facility] pharmacy for medication synchronization, packaging and delivery Patient self care activities - Over the next 90 days, patient will: Focus on medication adherence  Adherence strategies to try: Place morning and evening medications by toothbrush to remember to take Drink a glass of water every morning and evening and place medications by sink in kitchen to improve adherence Set an alarm / reminder on your phone for morning and evening medication doses.  Take medications as prescribed Report any questions or concerns to PharmD and/or provider(s)

## 2021-07-25 NOTE — Chronic Care Management (AMB) (Addendum)
Chronic Care Management Pharmacy Note  07/25/2021 Name:  Randy Castro. MRN:  [id number] DOB:  [birth year]  Summary:  Patient has not started Janese Banks yet due to cost concerns:  Screened for [place] patient assistance program - Household income does not qualify (patient's wife still working) Reviewed 2023 HTA plans. Patient might be able to switch from HTA plan 1 to the diabetes and heart care plan With his current plan - specialist visits are $20 but with Diabetes and Heart Care plan - endo and cardio visits will be $0, other specialist $25 Estimated 2023 costs (including [place]) with his current plan would be $135/month thru June when he reaches coverage gap and then cost would be $209/month Estimated 2023 costs (including Farxiga) with the Diabetes and [place] would be $15/month thru June when he reaches coverage gap and then cost would be $143/month.  Patient's wife to call [employer] to discuss the option of switching to Diabetes and Heart Care plan. If this ends but being the plan he chooses, she states $143 / month is affordable and they would be willing to start [place]. Patient's wife to call me next week. We can also use a 30 days free coupon for the first fill if he decides to start [place].   Subjective: Randy Castro. is an 70 y.o. year old male who is a primary patient of Paz, Alda Berthold, MD.  The CCM team was consulted for assistance with disease management and care coordination needs.    Engaged with patient by telephone for follow up visit in response to provider referral for pharmacy case management and/or care coordination services.   Consent to Services:  The patient was given information about Chronic Care Management services, agreed to services, and gave verbal consent prior to initiation of services.  Please see initial visit note for detailed documentation.   Patient Care Team: Colon Branch, MD as PCP - General (Internal Medicine) Satira Sark, MD as PCP - Cardiology (Cardiology) Larey Dresser, MD as Consulting Physician (Cardiology) Lake Bells., MD as Referring Physician (Gastroenterology) Gearlean Alf, PA-C as Physician Assistant (Pulmonary Disease) Demetrius Revel, MD as Consulting Physician (Bariatrics) [name], [name], DO as Consulting Physician (Neurology) [name], [place] (Pharmacist) Madelin Headings, DO (Optometry)  Recent office visits: 07/23/2021 - Int Med (Dr Larose Kells) F/U anxiety and depression. Improved with fluoxetine 19m daily. BP was at goal. F/U 3 to 4 months 04/17/2021 - PCP (Dr PLarose Kells - Follow up chronic conditions. Expressed increased anxieyt and fatigue. Changed citalopram 264mto fluoxetine 2035maily for 3 weeks, then increase to 57m11mily.  Recent consult visits: 07/15/2021 - Endo (Dr ShamKelton PillarU diabetes. A1c was 9.1. Started Farxiga 5mg 46mly before breakfast. BP also noted to be elevated.  05/12/2021 - Cardio (Dr McDowDomenic Politetine cardio f/u. No med changes. Suggested discussing SGLT2 with either endo or PCP for DM and cardiac benefits.  04/24/2021 - Neuro (Dr [name]) F/U essential tremor, gait instability, B12 def. No med changes noted. 01/07/2021 - Endo (Shamleffer) F/U type 2 DM; Changed Novolin 70/30 insulin from 18 units bid to 14 units prior to breakfast and 18 units prior to supper. Dexcom also prescribed.   Hospital visits: None in previous 6 months  Objective:  Lab Results  Component Value Date   CREATININE 0.64 04/17/2021   CREATININE 0.71 10/03/2020   CREATININE 0.66 04/15/2020    Lab Results  Component Value Date   HGBA1C 9.1 (A) 07/15/2021  Last diabetic Eye exam:  Lab Results  Component Value Date/Time   HMDIABEYEEXA Retinopathy (A) 07/19/2019 12:00 AM    Last diabetic Foot exam:  Lab Results  Component Value Date/Time   HMDIABFOOTEX Done 03/19/2019 12:00 AM        Component Value Date/Time   CHOL 110 04/17/2021 0919   TRIG 102.0 04/17/2021 0919   HDL 43.80  04/17/2021 0919   CHOLHDL 3 04/17/2021 0919   VLDL 20.4 04/17/2021 0919   LDLCALC 46 04/17/2021 0919   LDLDIRECT 145.0 04/26/2018 1026    Hepatic Function Latest Ref Rng & Units 07/23/2021 10/22/2020 10/03/2020  Total Protein 6.0 - 8.3 g/dL 5.9(L) 5.9(L) 6.1  Albumin 3.5 - 5.2 g/dL 3.9 - 4.0  AST 0 - 37 U/L 26 - 37  ALT 0 - 53 U/L 37 - 47  Alk Phosphatase 39 - 117 U/L 60 - 69  Total Bilirubin 0.2 - 1.2 mg/dL 0.5 - 0.5  Bilirubin, Direct 0.0 - 0.3 mg/dL 0.1 - -    Lab Results  Component Value Date/Time   TSH 1.11 04/17/2021 09:19 AM   TSH 0.92 04/26/2018 10:26 AM    CBC Latest Ref Rng & Units 04/17/2021 10/03/2020 04/01/2020  WBC 4.0 - 10.5 K/uL 6.3 6.0 6.5  Hemoglobin 13.0 - 17.0 g/dL 12.3(L) 12.6(L) 11.8(L)  Hematocrit 39.0 - 52.0 % 36.5(L) 37.9(L) 35.1(L)  Platelets 150.0 - 400.0 K/uL 236.0 226.0 246.0    No results found for: VD25OH  Clinical ASCVD: Yes  The ASCVD Risk score (Arnett DK, et al., 2019) failed to calculate for the following reasons:   The patient has a prior MI or stroke diagnosis     Social History   Tobacco Use  Smoking Status Former   Packs/day: 1.00   Years: 25.00   Pack years: 25.00   Types: Cigarettes   Quit date: 04/12/1992   Years since quitting: 29.3  Smokeless Tobacco Never   BP Readings from Last 3 Encounters:  07/23/21 122/64  07/15/21 (!) 174/90  05/12/21 (!) 144/80   Pulse Readings from Last 3 Encounters:  07/23/21 64  07/15/21 (!) 56  05/12/21 (!) 55   Wt Readings from Last 3 Encounters:  07/23/21 219 lb 2 oz (99.4 kg)  07/15/21 221 lb 3.2 oz (100.3 kg)  06/02/21 219 lb (99.3 kg)    Assessment: Review of patient past medical history, allergies, medications, health status, including review of consultants reports, laboratory and other test data, was performed as part of comprehensive evaluation and provision of chronic care management services.   SDOH:  (Social Determinants of Health) assessments and interventions performed:   SDOH Interventions    Flowsheet Row Most Recent Value  SDOH Interventions   Financial Strain Interventions Other (Comment)  [screened for patient assistance for [place] and review insurance options.]       CCM Care Plan  Allergies  Allergen Reactions   Trulicity [Dulaglutide] Nausea And Vomiting    Medications Reviewed Today     Reviewed by [name], RPH-CPP (Pharmacist) on 07/25/21 at 1004  Med List Status: <None>   Medication Order Taking? Sig Documenting Provider Last Dose Status Informant  aspirin EC 81 MG tablet [id number] Yes Take 81 mg by mouth daily. [provider] Taking Active   atorvastatin (LIPITOR) 80 MG tablet [id number] Yes TAKE ONE TABLET BY MOUTH EVERYDAY AT BEDTIME Colon Branch, MD Taking Active   Blood Glucose Monitoring Suppl (ONE TOUCH ULTRA 2) w/Device KIT [id number] Yes by Does not apply route.  [provider] Taking Active   carvedilol (COREG) 12.5 MG tablet [id number] Yes TAKE ONE TABLET BY MOUTH AT BREAKFAST AND AT BEDTIME Colon Branch, MD Taking Active   clotrimazole-betamethasone (LOTRISONE) cream [id number] Yes Apply topically 2 (two) times daily as needed. Colon Branch, MD Taking Active   Continuous Blood Gluc Receiver ([place]) [place] [id number] Yes 1 Device by Does not apply route as directed. Shamleffer, Melanie Crazier, MD Taking Active   Continuous Blood Gluc Transmit (DEXCOM G6 TRANSMITTER) MISC [id number] Yes 1 Device by Does not apply route as directed. Shamleffer, Melanie Crazier, MD Taking Active   dapagliflozin propanediol (FARXIGA) 5 MG TABS tablet [id number] No Take 1 tablet (5 mg total) by mouth daily before breakfast.  Patient not taking: Reported on 07/25/2021   Shamleffer, Melanie Crazier, MD Not Taking Active   FEROSUL 325 (65 Fe) MG tablet [id number] Yes TAKE 1 TABLET BY MOUTH DAILY Cirigliano, Vito V, DO Taking Active   FLUoxetine (PROZAC) 40 MG capsule [id number] Yes Take 1 capsule (40 mg total) by mouth daily.  Colon Branch, MD Taking Active   glucose blood [facility] ULTRA) test strip [id number] Yes Check blood sugar 2-3 times daily.  Dx Code: E11.9 Colon Branch, MD Taking Active   losartan (COZAAR) 50 MG tablet [id number] Yes TAKE ONE TABLET BY MOUTH EVERYDAY AT BEDTIME Colon Branch, MD Taking Active   magnesium oxide (MAG-OX) 400 MG tablet [id number] Yes TAKE ONE TABLET BY MOUTH AT [place]ita [place] AND AT BEDTIME Colon Branch, MD Taking Active   metFORMIN (GLUCOPHAGE) 1000 MG tablet [id number] Yes TAKE ONE TABLET BY MOUTH AT BREAKFAST AND AT BEDTIME Colon Branch, MD Taking Active   nitroGLYCERIN (NITROSTAT) 0.4 MG SL tablet [id number] Yes Place 1 tablet (0.4 mg total) under the tongue every 5 (five) minutes as needed for chest pain. Satira Sark, MD Taking Active            Med Note [facility], Vilma Prader D   Thu Apr 17, 2021  8:36 AM) PRN  NOVOLIN 70/30 KWIKPEN (70-30) 100 UNIT/ML KwikPen [id number] Yes INJECT 18 UNITS into THE SKIN TWICE DAILY WITH A MEAL Shamleffer, Melanie Crazier, MD Taking Active   pantoprazole (PROTONIX) 40 MG tablet [id number] Yes TAKE ONE TABLET BY MOUTH AT BREAKFAST AND AT BEDTIME Colon Branch, MD Taking Active   Potassium 99 MG TABS [id number] Yes Take 1 tablet by mouth daily. [provider] Taking Active Spouse/Significant Other  primidone (MYSOLINE) 50 MG tablet [id number] Yes TAKE THREE TABLETS BY MOUTH EVERY MORNING and TAKE TWO TABLETS BY MOUTH EVERYDAY AT BEDTIME [name], [name], DO Taking Active   vitamin B-12 (CYANOCOBALAMIN) 1000 MCG tablet [id number] Yes Take 1,000 mcg by mouth daily. 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   Abdominal pain 11/17/2018   Cardiac arrhythmia, unspecified 11/14/2017   Hypomagnesemia 11/14/2017   Anemia  11/14/2017   Bleeding skin mole 11/14/2017   OSA on CPAP 09/21/2017   Obesity 01/23/2016   PCP NOTES >>> 06/08/2015   Anxiety and depression 05/31/2013   Intermediate coronary syndrome ([place]) 12/22/2012   Unstable angina ([place]) 12/16/2012   Fatigue 04/26/2012   Annual physical exam 10/28/2011   Rash  10/28/2011   Coronary Artery Disease 08/28/2011   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   Palpitations 08/28/2011   NSTEMI (non-ST elevated myocardial infarction) ([place]) 08/07/2011   DJD (degenerative joint disease) 05/28/2011   Poorly controlled type 2 diabetes mellitus with circulatory disorder ([place])    Hyperlipidemia    HTN (hypertension)     Immunization History  Administered Date(s) Administered   Fluad Quad(high Dose 65+) 05/04/2019   Influenza Split 06/30/2011   Influenza, High Dose Seasonal PF 05/31/2013, 05/27/2017, 06/07/2018   Influenza, Seasonal, Injecte, Preservative Fre 07/28/2012   Influenza,inj,Quad PF,6+ Mos 05/29/2014, 06/07/2015, 04/23/2016   Influenza-Unspecified 05/24/2020, 06/24/2021   PFIZER(Purple Top)SARS-COV-2 Vaccination 10/11/2019, 11/01/2019, 05/20/2020   Pneumococcal Conjugate-13 12/05/2014   Pneumococcal Polysaccharide-23 08/07/2011, 10/12/2018   Tdap 05/31/2013   Zoster Recombinat (Shingrix) 04/17/2021, 06/24/2021   Zoster, Live 11/28/2012    Conditions to be addressed/monitored: CAD, HTN, HLD, DMII, Anxiety, Depression and B12 deficiency; iron def anemia; ;essential tremor; peripheral neuropathy; hypomagnesemia; h/o gastric bypass surgery; h/o lower GI bleed and GERD  Care Plan : General Pharmacy (Adult)  Updates made by [name], RPH-CPP since 07/25/2021 12:00 AM     Problem: Management of Chronic Conditions: Hypertension, Hyperlipidemia, Heart Disease, Diabetes, Depression, GERD/History of GI Bleed, Essential Tremor, Hypomagnesemia, neuropathy   Priority: High  Onset Date: 11/25/2020  Note:   Current Barriers:  Unable to achieve control of  type 2 DM and HTN  Not filling medications on time despite using [facility] packaging system and delivery - improving Depression not controlled - PCP changed medication 04/17/2021 - improved with new dose of fluoxetine   Pharmacist Clinical Goal(s):  Over the next 90 days, patient will achieve adherence to monitoring guidelines and medication adherence to achieve therapeutic efficacy achieve control of diabetes as evidenced by A1c <8.0 and BP <140/90 adhere to prescribed medication regimen as evidenced by refill history  through collaboration with PharmD and provider.   Interventions: 1:1 collaboration with Colon Branch, MD regarding development and update of comprehensive plan of care as evidenced by provider attestation and co-signature Inter-disciplinary care team collaboration (see longitudinal plan of care) Comprehensive medication review performed; medication list updated in electronic medical record  Diabetes: Managed by Endo - Dr Kelton Pillar Uncontrolled - last A1c was 8.4% (goal is A1c < 8.0%) Current treatment:  Novolin 70/30 18 units with morning meal and 18 units with evening meal Metformin 1093m twice daily Farxiga 555mdaily (has not started yet due to cost - prescribed 1191/6384Has tried Trulicity in past - caused nausea (also has h/o gastric bypass surgery and lower GI bleed). Tried jardiance and invokana - Jadiance stopped due to cost and Invokana was non formulary; Lantus taken in past but stopped due to cost.  Dr McDomenic Politeentioned trial of SGLT2 during his office visit 04/2021.  Current glucose readings: Patient states he has not been checking BG regularly.  Was prescribed DEXCom CGM per Dr ShKelton Pillarut patient reports his insuarnce declined because he was not injecting insulin 3 or more times per day.  No recent hypoglycemia Diet - reports he has been trying to avoid sweets and high carbohydrate foods like bread and potatoes.  Current exercise: walking some, renovating  home, yardwork, working in his shop Interventions:  Reviewed home blood glucose readings and reviewed goals  Fasting blood glucose goal (before meals) = 80 to 130 Blood glucose goal after a meal = less than 180  Reminded patient to check blood glucose  2 to 3 times per day Screened for [employer]  patient assistance program - Household income does not qualify (patient's wife still working) Reviewed 2023 HTA plans. Patient might be able to switch from HTA plan 1 to the diabetes and heart care plan With his current plan - specialist visits are $20 but with Diabetes and Heart Care plan - endo and cardio visits will be $0, other specialist $25 Estimated 2023 costs (including [place]) with his current plan would be $135/month thru June when he reaches coverage gap and then cost would be $209/month Estimated 2023 costs (including Farxiga) with the Diabetes and [place] would be $15/month thru June when he reaches coverage gap and then cost would be $143/month.  Patient's wife to call [employer] to discuss the option of switching to Diabetes and Heart Care plan. If this ends but being the plan he chooses, she states $143 / month is affordable and they would be willing to start [place]. Patient's wife to call me next week. We can also use a 30 days free coupon for the first fill if he decides to start [place].    Hypertension: Uncontrolled but improving. BP Readings from Last 3 Encounters:  07/23/21 122/64  07/15/21 (!) 174/90  05/12/21 (!) 144/80  Current treatment: Carvedilol 12.48m twice daily Losartan 540mdaily Current home readings: patient has not started checking at home as recommended by PCP Interventions:  Counseled on checking blood pressure  at home to get better idea of BP outside of office.  Recommended If blood pressure  continues to be above goal; consider increaseing losartan to 10064maily or add HCTZ to regimen Coordinated with pharmacy to see if could determine  reason for not needing refills on all meds in June. No reason identified. Discussed adherence with patient and wife.   Hyperlipidemia / CAD: Controlled;  Current treatment:  Aspirin 40m50mily Atorvastatin 80mg67mly Nitroglycerin 0.4mg a11meeded Interventions:  Recommended continue current therapy Discussed adherence to maintenance medications - adherence has improved since patient started monthly packaging.  Will continue to follow adherence and intervene as needed.   Depression/Anxiety/: Uncontrolled but improved with change from citalopram to fluoxetine 04/17/2021 Current treatment: Fluoxetine 40mg -51me 1 capsule daily  PHQ9 SCORE ONLY 07/23/2021 06/02/2021 05/26/2021  PHQ-9 Total Score 0 0 3  Patient reports he has more energy and has more interests in hobbies and getting out since switch from citalopram to fluoxetine.  Interventions  Recommended continue current therapy  Neuropathy Controlled; B12 checked 07/23/2021 and improved from previous 257 (11/01/2020)  Lab Results  Component Value Date   VITAMINB12 611 07/23/2021  Current regimen:  OTC B12 1000mcg d71m  Interventions:  Consider repeat B12 6 to 12 months Discussed that he should consider balance therapy as recommended by Dr [name] (neuro)  Hypomagnesemia Current regimen:  Magnesium oxide 400mg twi25maily Interventions: Consider repeat Magnesium level at next office visit  Health Maintenance  Interventions: Discussed diet and exercise Continue to be active daily either walking, yardwork Limit snacking and serving sizes.   Medication management Current pharmacy: [facility]  Interventions Comprehensive medication review performed. Utilize [facility] pharmacy for medication synchronization, packaging and delivery Reviewed adherence and contacted pharmacy to verify fills. Discussed adherence with patient and his wife Adherence strategies to try Place morning and evening medications by toothbrush to remember  to take Drink a glass of water every morning and evening and place medications by sink in kitchen to improve adherence Set an alarm / reminder on your phone for morning and evening medication doses.   Patient  Goals/Self-Care Activities Over the next 90 days, patient will:  take medications as prescribed check glucose 2 to 3 times a day, document, and provide at future appointments,  check blood pressure 1 to 2 times per week, document, and provide at future appointments engage in dietary modifications by decreasing intake of high sugar / CHO snacks and limiting serving sizes  Follow Up Plan: Telephone follow up appointment with care management team member scheduled for:  1-2 months       Medication Assistance:  Screened for [place] patient assistance program - household income did not qualify (patient's wife is still working and income too high)   Patient's preferred pharmacy is:  [facility] - [place] [employer], [place] - [address] [place] [address] Phone: [phone] Fax: [place], [place] [place] [address] Phone: [phone] Fax: [phone]  [facility] (New Address) - [place], [place] AT Previously: [address], [place] [address] [place] [address] Phone: [phone] Fax: [phone]  Uses pill box? No - using packaging from [facility] Adherence is about 90% - working with patient and his wife to increase adherence.    Follow Up:  Patient agrees to Care Plan and Follow-up.  Plan: Telephone follow up appointment with care management team member scheduled for:  30 days with clinical pharmacist.   [name], PharmD Clinical Pharmacist [place] [facility] [phone]  I have personally reviewed this encounter including the documentation in this note and have collaborated with the care management provider regarding care management and care coordination activities to include development and update of the comprehensive care plan. I am certifying that I agree with the content of this note and encounter as supervising physician.  Kathlene November, MD

## 2021-07-28 ENCOUNTER — Telehealth: Payer: Self-pay | Admitting: Pharmacist

## 2021-07-28 MED ORDER — DAPAGLIFLOZIN PROPANEDIOL 5 MG PO TABS: 5.0000 mg | ORAL_TABLET | Freq: Every day | ORAL | 1 refills | Status: DC

## 2021-07-28 NOTE — Telephone Encounter (Signed)
Patient's wife states that they decided to stay with [employer] Plan 1. The Heart and Diabetes plan did not cover out of network providers and she was afraid [facility] in [place] where they live would not be in network.  She also states he would like a 30 days trial of Farxiga 5mg  daily. Rx was called to [facility] and provided card info for 30 days free.  Plan to follow up with patient in 1 month to see if blood glucose improved.

## 2021-08-14 ENCOUNTER — Other Ambulatory Visit (HOSPITAL_COMMUNITY): Payer: Self-pay

## 2021-08-23 DIAGNOSIS — F32A Depression, unspecified: Secondary | ICD-10-CM

## 2021-08-23 DIAGNOSIS — I10 Essential (primary) hypertension: Secondary | ICD-10-CM | POA: Diagnosis not present

## 2021-08-23 DIAGNOSIS — Z794 Long term (current) use of insulin: Secondary | ICD-10-CM | POA: Diagnosis not present

## 2021-08-23 DIAGNOSIS — E1165 Type 2 diabetes mellitus with hyperglycemia: Secondary | ICD-10-CM | POA: Diagnosis not present

## 2021-08-23 DIAGNOSIS — F419 Anxiety disorder, unspecified: Secondary | ICD-10-CM | POA: Diagnosis not present

## 2021-08-23 DIAGNOSIS — E7800 Pure hypercholesterolemia, unspecified: Secondary | ICD-10-CM | POA: Diagnosis not present

## 2021-08-29 ENCOUNTER — Ambulatory Visit (INDEPENDENT_AMBULATORY_CARE_PROVIDER_SITE_OTHER): Payer: PPO | Admitting: Pharmacist

## 2021-08-29 DIAGNOSIS — I10 Essential (primary) hypertension: Secondary | ICD-10-CM

## 2021-08-29 DIAGNOSIS — E1142 Type 2 diabetes mellitus with diabetic polyneuropathy: Secondary | ICD-10-CM

## 2021-08-29 DIAGNOSIS — I2510 Atherosclerotic heart disease of native coronary artery without angina pectoris: Secondary | ICD-10-CM

## 2021-08-29 DIAGNOSIS — E7800 Pure hypercholesterolemia, unspecified: Secondary | ICD-10-CM

## 2021-08-29 MED ORDER — FREESTYLE LIBRE 3 SENSOR MISC: 1.0000 | 5 refills | Status: DC

## 2021-08-29 NOTE — Patient Instructions (Addendum)
Randy Castro, It was a pleasure speaking with you today.  I have attached a summary of our visit today and information about your health goals.   Our next appointment is by telephone on September 26, 2021 at 10:15am  Please call the care guide team at [phone] if you need to cancel or reschedule your appointment.   If you have any questions or concerns, please feel free to contact me either at the phone number below or with a MyChart message.   Keep up the good work!  [name], PharmD Clinical Pharmacist [facility] [phone] (direct line)  [phone] (main office number)   Patient Goals/Self-Care Activities Over the next 90 days, patient will:  take medications as prescribed Adherence strategies to try Place morning and evening medications by toothbrush to remember to take Drink a glass of water every morning and evening and place medications by sink in kitchen to improve adherence Set an alarm / reminder on your phone for morning and evening medication doses.  check glucose 2 to 3 times a day, document, and provide at future appointments,  check blood pressure 1 to 2 times per week, document, and provide at future appointments engage in dietary modifications by decreasing intake of high sugar / CHO snacks and limiting serving sizes Pick up Freestyle 3 glucose sensors from [employer] - call me to set up time to teach you how to use. [name] [name] [phone])  Patient verbalizes understanding of instructions provided today and agrees to view in [place].

## 2021-08-29 NOTE — Chronic Care Management (AMB) (Signed)
Chronic Care Management Pharmacy Note  08/29/2021 Name:  Randy Castro. MRN:  [id number] DOB:  1951/07/30  Summary:  Patient started Wilder Glade and reports blood glucose improved to 130's and 140's. He did run out 2 days ago. Did not realize he had refill he could request at pharmacy. Coordinated with pharmacy to refill of Farxiga.  Screened for [place] patient assistance program - Household income does not qualify (patient's wife still working) Patient had been prescribed DexCom but not covered however 2023 benefits for [employer] show Libre 2 or 3 is $0 - Rx for [employer] 3 sent to [facility]. Patient will contact when he picks up to set up date for Continuous Glucose Monitor education.  Updated vaccine records - received COVID bivalent vaccine today.  Subjective: Randy Castro. is an 71 y.o. year old male who is a primary patient of Paz, Alda Berthold, MD.  The CCM team was consulted for assistance with disease management and care coordination needs.    Engaged with patient by telephone for follow up visit in response to provider referral for pharmacy case management and/or care coordination services.   Consent to Services:  The patient was given information about Chronic Care Management services, agreed to services, and gave verbal consent prior to initiation of services.  Please see initial visit note for detailed documentation.   Patient Care Team: Colon Branch, MD as PCP - General (Internal Medicine) Satira Sark, MD as PCP - Cardiology (Cardiology) Larey Dresser, MD as Consulting Physician (Cardiology) Lake Bells., MD as Referring Physician (Gastroenterology) Gearlean Alf, PA-C as Physician Assistant (Pulmonary Disease) Demetrius Revel, MD as Consulting Physician (Bariatrics) [name], [name], DO as Consulting Physician (Neurology) [name], [place] (Pharmacist) Madelin Headings, DO (Optometry)  Recent office visits: 07/23/2021 - Int Med (Dr Larose Kells) F/U anxiety and  depression. Improved with fluoxetine 79m daily. BP was at goal. F/U 3 to 4 months 04/17/2021 - PCP (Dr PLarose Kells - Follow up chronic conditions. Expressed increased anxieyt and fatigue. Changed citalopram 229mto fluoxetine 2085maily for 3 weeks, then increase to 58m83mily.  Recent consult visits: 07/15/2021 - Endo (Dr ShamKelton PillarU diabetes. A1c was 9.1. Started Farxiga 5mg 8mly before breakfast. BP also noted to be elevated.  05/12/2021 - Cardio (Dr McDowDomenic Politetine cardio f/u. No med changes. Suggested discussing SGLT2 with either endo or PCP for DM and cardiac benefits.  04/24/2021 - Neuro (Dr [name]) F/U essential tremor, gait instability, B12 def. No med changes noted. 01/07/2021 - Endo (Shamleffer) F/U type 2 DM; Changed Novolin 70/30 insulin from 18 units bid to 14 units prior to breakfast and 18 units prior to supper. Dexcom also prescribed.   Hospital visits: None in previous 6 months  Objective:  Lab Results  Component Value Date   CREATININE 0.64 04/17/2021   CREATININE 0.71 10/03/2020   CREATININE 0.66 04/15/2020    Lab Results  Component Value Date   HGBA1C 9.1 (A) 07/15/2021   Last diabetic Eye exam:  Lab Results  Component Value Date/Time   HMDIABEYEEXA Retinopathy (A) 07/19/2019 12:00 AM    Last diabetic Foot exam:  Lab Results  Component Value Date/Time   HMDIABFOOTEX Done 03/19/2019 12:00 AM        Component Value Date/Time   CHOL 110 04/17/2021 0919   TRIG 102.0 04/17/2021 0919   HDL 43.80 04/17/2021 0919   CHOLHDL 3 04/17/2021 0919   VLDL 20.4 04/17/2021 0919   LDLCALC 46 04/17/2021 0919  LDLDIRECT 145.0 04/26/2018 1026    Hepatic Function Latest Ref Rng & Units 07/23/2021 10/22/2020 10/03/2020  Total Protein 6.0 - 8.3 g/dL 5.9(L) 5.9(L) 6.1  Albumin 3.5 - 5.2 g/dL 3.9 - 4.0  AST 0 - 37 U/L 26 - 37  ALT 0 - 53 U/L 37 - 47  Alk Phosphatase 39 - 117 U/L 60 - 69  Total Bilirubin 0.2 - 1.2 mg/dL 0.5 - 0.5  Bilirubin, Direct 0.0 - 0.3 mg/dL 0.1 - -     Lab Results  Component Value Date/Time   TSH 1.11 04/17/2021 09:19 AM   TSH 0.92 04/26/2018 10:26 AM    CBC Latest Ref Rng & Units 04/17/2021 10/03/2020 04/01/2020  WBC 4.0 - 10.5 K/uL 6.3 6.0 6.5  Hemoglobin 13.0 - 17.0 g/dL 12.3(L) 12.6(L) 11.8(L)  Hematocrit 39.0 - 52.0 % 36.5(L) 37.9(L) 35.1(L)  Platelets 150.0 - 400.0 K/uL 236.0 226.0 246.0    No results found for: VD25OH  Clinical ASCVD: Yes  The ASCVD Risk score (Arnett DK, et al., 2019) failed to calculate for the following reasons:   The patient has a prior MI or stroke diagnosis     Social History   Tobacco Use  Smoking Status Former   Packs/day: 1.00   Years: 25.00   Pack years: 25.00   Types: Cigarettes   Quit date: 04/12/1992   Years since quitting: 29.4  Smokeless Tobacco Never   BP Readings from Last 3 Encounters:  07/23/21 122/64  07/15/21 (!) 174/90  05/12/21 (!) 144/80   Pulse Readings from Last 3 Encounters:  07/23/21 64  07/15/21 (!) 56  05/12/21 (!) 55   Wt Readings from Last 3 Encounters:  07/23/21 219 lb 2 oz (99.4 kg)  07/15/21 221 lb 3.2 oz (100.3 kg)  06/02/21 219 lb (99.3 kg)    Assessment: Review of patient past medical history, allergies, medications, health status, including review of consultants reports, laboratory and other test data, was performed as part of comprehensive evaluation and provision of chronic care management services.   SDOH:  (Social Determinants of Health) assessments and interventions performed:  SDOH Interventions    Flowsheet Row Most Recent Value  SDOH Interventions   Financial Strain Interventions Other (Comment)  [agreeable to $47/month for [place]  Allergies  Allergen Reactions   Trulicity [Dulaglutide] Nausea And Vomiting    Medications Reviewed Today     Reviewed by [name], RPH-CPP (Pharmacist) on 08/29/21 at 59  Med List Status: <None>   Medication Order Taking? Sig Documenting Provider Last Dose Status  Informant  aspirin EC 81 MG tablet [id number] Yes Take 81 mg by mouth daily. [provider] Taking Active   atorvastatin (LIPITOR) 80 MG tablet [id number] Yes TAKE ONE TABLET BY MOUTH EVERYDAY AT BEDTIME Colon Branch, MD Taking Active   Blood Glucose Monitoring Suppl (ONE TOUCH ULTRA 2) w/Device KIT [id number] Yes by Does not apply route. [provider] Taking Active   carvedilol (COREG) 12.5 MG tablet [id number] Yes TAKE ONE TABLET BY MOUTH AT BREAKFAST AND AT BEDTIME Colon Branch, MD Taking Active   clotrimazole-betamethasone (LOTRISONE) cream [id number] Yes Apply topically 2 (two) times daily as needed. Colon Branch, MD Taking Active   Continuous Blood Gluc Receiver (DEXCOM G6 RECEIVER) DEVI [id number] No 1 Device by Does not apply route as directed.  Patient not taking: Reported on 08/29/2021   Shamleffer, Melanie Crazier, MD Not Taking Active   Continuous Blood Gluc  Transmit (DEXCOM G6 TRANSMITTER) MISC [id number] No 1 Device by Does not apply route as directed.  Patient not taking: Reported on 08/29/2021   Shamleffer, Melanie Crazier, MD Not Taking Active   dapagliflozin propanediol (FARXIGA) 5 MG TABS tablet [id number] No Take 1 tablet (5 mg total) by mouth daily before breakfast.  Patient not taking: Reported on 08/29/2021   Colon Branch, MD Not Taking Active   FEROSUL 325 (65 Fe) MG tablet [id number] Yes TAKE 1 TABLET BY MOUTH DAILY Cirigliano, Vito V, DO Taking Active   FLUoxetine (PROZAC) 40 MG capsule [id number] Yes Take 1 capsule (40 mg total) by mouth daily. Colon Branch, MD Taking Active   glucose blood [facility] ULTRA) test strip [id number] Yes Check blood sugar 2-3 times daily.  Dx Code: E11.9 Colon Branch, MD Taking Active   losartan (COZAAR) 50 MG tablet [id number] Yes TAKE ONE TABLET BY MOUTH EVERYDAY AT BEDTIME Colon Branch, MD Taking Active   magnesium oxide (MAG-OX) 400 MG tablet [id number] Yes TAKE ONE TABLET BY MOUTH AT Elita [place] AND AT BEDTIME Colon Branch, MD Taking Active    metFORMIN (GLUCOPHAGE) 1000 MG tablet [id number] Yes TAKE ONE TABLET BY MOUTH AT BREAKFAST AND AT BEDTIME Colon Branch, MD Taking Active   nitroGLYCERIN (NITROSTAT) 0.4 MG SL tablet [id number] Yes Place 1 tablet (0.4 mg total) under the tongue every 5 (five) minutes as needed for chest pain. Satira Sark, MD Taking Active            Med Note [facility], Vilma Prader D   Thu Apr 17, 2021  8:36 AM) PRN  NOVOLIN 70/30 KWIKPEN (70-30) 100 UNIT/ML KwikPen [id number] Yes INJECT 18 UNITS into THE SKIN TWICE DAILY WITH A MEAL Shamleffer, Melanie Crazier, MD Taking Active   pantoprazole (PROTONIX) 40 MG tablet [id number] Yes TAKE ONE TABLET BY MOUTH AT BREAKFAST AND AT BEDTIME Colon Branch, MD Taking Active   Potassium 99 MG TABS [id number] Yes Take 1 tablet by mouth daily. [provider] Taking Active Spouse/Significant Other  primidone (MYSOLINE) 50 MG tablet [id number] Yes TAKE THREE TABLETS BY MOUTH EVERY MORNING and TAKE TWO TABLETS BY MOUTH EVERYDAY AT BEDTIME [name], [name], DO Taking Active   vitamin B-12 (CYANOCOBALAMIN) 1000 MCG tablet [id number] Yes Take 1,000 mcg by mouth daily. 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   Abdominal pain 11/17/2018   Cardiac arrhythmia, unspecified 11/14/2017   Hypomagnesemia 11/14/2017   Anemia 11/14/2017   Bleeding skin mole 11/14/2017   OSA on CPAP 09/21/2017   Obesity 01/23/2016   PCP NOTES >>> 06/08/2015   Anxiety and depression 05/31/2013   Intermediate coronary syndrome ([place]) 12/22/2012   Unstable angina ([place]) 12/16/2012   Fatigue 04/26/2012   Annual physical exam 10/28/2011   Rash 10/28/2011   Coronary Artery Disease 08/28/2011   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   Palpitations 08/28/2011   NSTEMI (non-ST  elevated myocardial infarction) ([place]) 08/07/2011   DJD (degenerative joint disease) 05/28/2011   Poorly controlled type 2 diabetes mellitus with circulatory disorder ([place])    Hyperlipidemia    HTN (hypertension)     Immunization History  Administered Date(s) Administered   Fluad Quad(high Dose 65+) 05/04/2019   Influenza  Split 06/30/2011   Influenza, High Dose Seasonal PF 05/31/2013, 05/27/2017, 06/07/2018   Influenza, Seasonal, Injecte, Preservative Fre 07/28/2012   Influenza,inj,Quad PF,6+ Mos 05/29/2014, 06/07/2015, 04/23/2016   Influenza-Unspecified 05/24/2020, 06/24/2021   PFIZER(Purple Top)SARS-COV-2 Vaccination 10/11/2019, 11/01/2019, 05/20/2020   Pneumococcal Conjugate-13 12/05/2014   Pneumococcal Polysaccharide-23 08/07/2011, 10/12/2018   Tdap 05/31/2013   Zoster Recombinat (Shingrix) 04/17/2021, 06/24/2021   Zoster, Live 11/28/2012    Conditions to be addressed/monitored: CAD, HTN, HLD, DMII, Anxiety, Depression and B12 deficiency; iron def anemia; ;essential tremor; peripheral neuropathy; hypomagnesemia; h/o gastric bypass surgery; h/o lower GI bleed and GERD  Care Plan : General Pharmacy (Adult)  Updates made by [name], RPH-CPP since 08/29/2021 12:00 AM     Problem: Management of Chronic Conditions: Hypertension, Hyperlipidemia, Heart Disease, Diabetes, Depression, GERD/History of GI Bleed, Essential Tremor, Hypomagnesemia, neuropathy   Priority: High  Onset Date: 11/25/2020  Note:   Current Barriers:  Unable to achieve control of type 2 DM and HTN  Not filling medications on time despite using [facility] packaging system and delivery - improving Depression not controlled - PCP changed medication 04/17/2021 - improved with new dose of fluoxetine   Pharmacist Clinical Goal(s):  Over the next 90 days, patient will achieve adherence to monitoring guidelines and medication adherence to achieve therapeutic efficacy achieve control of diabetes as evidenced by A1c <8.0  and BP <140/90 adhere to prescribed medication regimen as evidenced by refill history  through collaboration with PharmD and provider.   Interventions: 1:1 collaboration with Colon Branch, MD regarding development and update of comprehensive plan of care as evidenced by provider attestation and co-signature Inter-disciplinary care team collaboration (see longitudinal plan of care) Comprehensive medication review performed; medication list updated in electronic medical record  Diabetes: Managed by Endo - Dr Kelton Pillar Uncontrolled - last A1c was 8.4% (goal is A1c < 8.0%) Current treatment:  Novolin 70/30 18 units with morning meal and 18 units with evening meal Metformin 1064m twice daily Farxiga 531mdaily (started 07/28/2021 but ran out 2 days ago) Has tried Trulicity in past - caused nausea (also has h/o gastric bypass surgery and lower GI bleed). Tried jardiance and invokana - Jadiance stopped due to cost and Invokana was non formulary; Lantus taken in past but stopped due to cost.  Current glucose readings: since started Farxiga in 130's to 160's Was prescribed DEXCom CGM per Dr ShKelton Pillarut patient reports his insuarnce declined because he was not injecting insulin 3 or more times per day.  No recent hypoglycemia Diet - reports he has been trying to avoid sweets and high carbohydrate foods like bread and potatoes.  Current exercise: walking some, renovating home, yardwork, working in his shop Interventions:  Reviewed home blood glucose readings and reviewed goals  Fasting blood glucose goal (before meals) = 80 to 130 Blood glucose goal after a meal = less than 180  Reviewed 2023 benefits for [employer] and appears for 2023 they are covering [employer] for $0. [facility] order yet). Patient to let me know when he has sensors and I will set up time for education.  Screened for [place] assistance  program - Household income does not qualify (patient's wife still working) but cost will be $47 per month (at least until reaches coverage gap and patient is Ok[place]eth[place]ith that cost.  [facility].   Hypertension: Uncontrolled but improving. BP Readings from Last 3 Encounters:  07/23/21 122/64  07/15/21 (!) 174/90  05/12/21 (!) 144/80  Current treatment: Carvedilol 12.16m twice daily (filled for 30 day supply 07/18/2021 and 08/21/2021) Losartan 524mdaily (filled for 30 day supply 07/18/2021 and 08/21/2021) Current home readings: patient was not at home where he could provider readings.  Adherence with losartan has improved since  November 2022 Interventions:  Counseled on checking blood pressure  at home to get better idea of BP outside of office.  Recommended If blood pressure  continues to be above goal; consider increaseing losartan to 10035maily or add HCTZ to regimen  Hyperlipidemia / CAD: Controlled;  Current treatment:  Aspirin 83m25mily Atorvastatin 80mg65mly (filled for 30 day supply 07/18/2021 and 08/21/2021) Nitroglycerin 0.4mg a99meeded Interventions:  Recommended continue current therapy Discussed adherence to maintenance medications - adherence has improved since patient started monthly packaging.  Will continue to follow adherence and intervene as needed.   Depression/Anxiety/: Uncontrolled but improved with change from citalopram to fluoxetine 04/17/2021 Current treatment: Fluoxetine 40mg -63me 1 capsule daily (filled for 30 day supply 07/18/2021 and 08/21/2021)  PHQ9 SCORE ONLY 07/23/2021 06/02/2021 05/26/2021  PHQ-9 Total Score 0 0 3  Patient reports he has more energy and has more interests in hobbies and getting out since switch from citalopram to fluoxetine.  Interventions  Recommended continue current therapy  Neuropathy Controlled; B12 checked 07/23/2021 and improved from previous 257 (11/01/2020)  Lab Results  Component  Value Date   VITAMINB12 611 07/23/2021  Current regimen:  OTC B12 1000mcg d12m  Interventions:  Consider repeat B12 6 to 12 months Discussed that he should consider balance therapy as recommended by Dr [name] (neuro)  Hypomagnesemia Current regimen:  Magnesium oxide 400mg twi66maily Interventions: Consider repeat Magnesium level at next office visit  Health Maintenance  Interventions: Discussed diet and exercise Continue to be active daily either walking, yardwork Limit snacking and serving sizes.  Patient received COVID booster / bivalent today - Moderna from [employer] vaccine records.   Medication management Current pharmacy: [facility]  Interventions Comprehensive medication review performed. Utilize [facility] pharmacy for medication synchronization, packaging and delivery Reviewed refill history and assesses adherence. Discussed adherence with patient and his wife.  Coordinated refill for Farxiga for patient   Patient Goals/Self-Care Activities Over the next 90 days, patient will:  take medications as prescribed Adherence strategies to try Place morning and evening medications by toothbrush to remember to take Drink a glass of water every morning and evening and place medications by sink in kitchen to improve adherence Set an alarm / reminder on your phone for morning and evening medication doses.  check glucose 2 to 3 times a day, document, and provide at future appointments,  check blood pressure 1 to 2 times per week, document, and provide at future appointments engage in dietary modifications by decreasing intake of high sugar / CHO snacks and limiting serving sizes Pick up Freestyle 3 glucose sensors from [employer] to set up time for teaching you how to use.   Follow Up Plan: Telephone follow up appointment with care management team member scheduled for:  1 month       Medication Assistance:  Screened for Farxiga p[place]assistance program -  household income did not qualify (patient's wife is still working and income too high)   Patient's preferred pharmacy is:  [facility] [address] [id number] - , [place] [address] [place] [address] Phone: [phone] Fax: [phone]   Uses pill box? No - using packaging from [facility] Adherence is about 95% - working with patient and his wife to increase adherence.    Follow Up:  Patient agrees to Care Plan and Follow-up.  Plan: Telephone follow up appointment with care management team member scheduled for:  30 days with clinical pharmacist.   [name], PharmD Clinical Pharmacist [facility] [phone]

## 2021-09-18 ENCOUNTER — Other Ambulatory Visit: Payer: Self-pay | Admitting: Internal Medicine

## 2021-09-21 ENCOUNTER — Other Ambulatory Visit: Payer: Self-pay | Admitting: Internal Medicine

## 2021-09-23 DIAGNOSIS — Z794 Long term (current) use of insulin: Secondary | ICD-10-CM | POA: Diagnosis not present

## 2021-09-23 DIAGNOSIS — I2510 Atherosclerotic heart disease of native coronary artery without angina pectoris: Secondary | ICD-10-CM | POA: Diagnosis not present

## 2021-09-23 DIAGNOSIS — I10 Essential (primary) hypertension: Secondary | ICD-10-CM | POA: Diagnosis not present

## 2021-09-23 DIAGNOSIS — E1142 Type 2 diabetes mellitus with diabetic polyneuropathy: Secondary | ICD-10-CM

## 2021-09-23 DIAGNOSIS — E7800 Pure hypercholesterolemia, unspecified: Secondary | ICD-10-CM | POA: Diagnosis not present

## 2021-09-26 ENCOUNTER — Ambulatory Visit (INDEPENDENT_AMBULATORY_CARE_PROVIDER_SITE_OTHER): Payer: PPO | Admitting: Pharmacist

## 2021-09-26 DIAGNOSIS — I10 Essential (primary) hypertension: Secondary | ICD-10-CM

## 2021-09-26 DIAGNOSIS — Z794 Long term (current) use of insulin: Secondary | ICD-10-CM

## 2021-09-26 DIAGNOSIS — E7800 Pure hypercholesterolemia, unspecified: Secondary | ICD-10-CM

## 2021-09-26 DIAGNOSIS — E1142 Type 2 diabetes mellitus with diabetic polyneuropathy: Secondary | ICD-10-CM

## 2021-09-26 NOTE — Chronic Care Management (AMB) (Signed)
Chronic Care Management Pharmacy Note  09/26/2021 Name:  Randy Castro. MRN:  [id number] DOB:  01-Jul-1951  Summary:  Patient started Wilder Glade and reports blood glucose improved to 130's and 140's most day.  Patient had been prescribed DexCom but not covered however 2023 benefits for HTA show Libre 2 or 3 is $0 - Coordinated with [facility] to fill [employer] 3.  Patient will contact when he picks up to set up date for Continuous Glucose Monitor education.   Subjective: Randy Castro. is an 71 y.o. year old male who is a primary patient of Paz, Alda Berthold, MD.  The CCM team was consulted for assistance with disease management and care coordination needs.    Engaged with patient by telephone for follow up visit in response to provider referral for pharmacy case management and/or care coordination services.   Consent to Services:  The patient was given information about Chronic Care Management services, agreed to services, and gave verbal consent prior to initiation of services.  Please see initial visit note for detailed documentation.   Patient Care Team: Colon Branch, MD as PCP - General (Internal Medicine) Satira Sark, MD as PCP - Cardiology (Cardiology) Larey Dresser, MD as Consulting Physician (Cardiology) Lake Bells., MD as Referring Physician (Gastroenterology) Gearlean Alf, PA-C as Physician Assistant (Pulmonary Disease) Demetrius Revel, MD as Consulting Physician (Bariatrics) [name], [name], DO as Consulting Physician (Neurology) [name], [place] (Pharmacist) Madelin Headings, DO (Optometry)  Recent office visits: 07/23/2021 - Int Med (Dr Larose Kells) F/U anxiety and depression. Improved with fluoxetine 67m daily. BP was at goal. F/U 3 to 4 months 04/17/2021 - PCP (Dr PLarose Kells - Follow up chronic conditions. Expressed increased anxieyt and fatigue. Changed citalopram 235mto fluoxetine 204maily for 3 weeks, then increase to 36m18mily.  Recent consult  visits: 07/15/2021 - Endo (Dr ShamKelton PillarU diabetes. A1c was 9.1. Started Farxiga 5mg 8mly before breakfast. BP also noted to be elevated.  05/12/2021 - Cardio (Dr McDowDomenic Politetine cardio f/u. No med changes. Suggested discussing SGLT2 with either endo or PCP for DM and cardiac benefits.  04/24/2021 - Neuro (Dr [name]) F/U essential tremor, gait instability, B12 def. No med changes noted. 01/07/2021 - Endo (Shamleffer) F/U type 2 DM; Changed Novolin 70/30 insulin from 18 units bid to 14 units prior to breakfast and 18 units prior to supper. Dexcom also prescribed.   Hospital visits: None in previous 6 months  Objective:  Lab Results  Component Value Date   CREATININE 0.64 04/17/2021   CREATININE 0.71 10/03/2020   CREATININE 0.66 04/15/2020    Lab Results  Component Value Date   HGBA1C 9.1 (A) 07/15/2021   Last diabetic Eye exam:  Lab Results  Component Value Date/Time   HMDIABEYEEXA Retinopathy (A) 07/19/2019 12:00 AM    Last diabetic Foot exam:  Lab Results  Component Value Date/Time   HMDIABFOOTEX Done 03/19/2019 12:00 AM        Component Value Date/Time   CHOL 110 04/17/2021 0919   TRIG 102.0 04/17/2021 0919   HDL 43.80 04/17/2021 0919   CHOLHDL 3 04/17/2021 0919   VLDL 20.4 04/17/2021 0919   LDLCALC 46 04/17/2021 0919   LDLDIRECT 145.0 04/26/2018 1026    Hepatic Function Latest Ref Rng & Units 07/23/2021 10/22/2020 10/03/2020  Total Protein 6.0 - 8.3 g/dL 5.9(L) 5.9(L) 6.1  Albumin 3.5 - 5.2 g/dL 3.9 - 4.0  AST 0 - 37 U/L 26 - 37  ALT 0 - 53 U/L 37 - 47  Alk Phosphatase 39 - 117 U/L 60 - 69  Total Bilirubin 0.2 - 1.2 mg/dL 0.5 - 0.5  Bilirubin, Direct 0.0 - 0.3 mg/dL 0.1 - -    Lab Results  Component Value Date/Time   TSH 1.11 04/17/2021 09:19 AM   TSH 0.92 04/26/2018 10:26 AM    CBC Latest Ref Rng & Units 04/17/2021 10/03/2020 04/01/2020  WBC 4.0 - 10.5 K/uL 6.3 6.0 6.5  Hemoglobin 13.0 - 17.0 g/dL 12.3(L) 12.6(L) 11.8(L)  Hematocrit 39.0 - 52.0 %  36.5(L) 37.9(L) 35.1(L)  Platelets 150.0 - 400.0 K/uL 236.0 226.0 246.0    No results found for: VD25OH  Clinical ASCVD: Yes  The ASCVD Risk score (Arnett DK, et al., 2019) failed to calculate for the following reasons:   The patient has a prior MI or stroke diagnosis     Social History   Tobacco Use  Smoking Status Former   Packs/day: 1.00   Years: 25.00   Pack years: 25.00   Types: Cigarettes   Quit date: 04/12/1992   Years since quitting: 29.4  Smokeless Tobacco Never   BP Readings from Last 3 Encounters:  07/23/21 122/64  07/15/21 (!) 174/90  05/12/21 (!) 144/80   Pulse Readings from Last 3 Encounters:  07/23/21 64  07/15/21 (!) 56  05/12/21 (!) 55   Wt Readings from Last 3 Encounters:  07/23/21 219 lb 2 oz (99.4 kg)  07/15/21 221 lb 3.2 oz (100.3 kg)  06/02/21 219 lb (99.3 kg)    Assessment: Review of patient past medical history, allergies, medications, health status, including review of consultants reports, laboratory and other test data, was performed as part of comprehensive evaluation and provision of chronic care management services.   SDOH:  (Social Determinants of Health) assessments and interventions performed:     CCM Care Plan  Allergies  Allergen Reactions   Trulicity [Dulaglutide] Nausea And Vomiting    Medications Reviewed Today     Reviewed by [name], RPH-CPP (Pharmacist) on 09/26/21 at 1051  Med List Status: <None>   Medication Order Taking? Sig Documenting Provider Last Dose Status Informant  aspirin EC 81 MG tablet [id number] Yes Take 81 mg by mouth daily. [provider] Taking Active   atorvastatin (LIPITOR) 80 MG tablet [id number] Yes TAKE ONE TABLET BY MOUTH EVERYDAY AT BEDTIME Colon Branch, MD Taking Active   Blood Glucose Monitoring Suppl (ONE TOUCH ULTRA 2) w/Device KIT [id number] Yes by Does not apply route. [provider] Taking Active   carvedilol (COREG) 12.5 MG tablet [id number] Yes TAKE ONE TABLET BY  MOUTH EVERY MORNING and TAKE ONE TABLET BY MOUTH EVERYDAY AT BEDTIME Colon Branch, MD Taking Active   clotrimazole-betamethasone (LOTRISONE) cream [id number] Yes Apply topically 2 (two) times daily as needed. Colon Branch, MD Taking Active   Continuous Blood Gluc Sensor (FREESTYLE LIBRE 3 SENSOR) [place] [id number] No 1 each by Does not apply route every 14 (fourteen) days. Place 1 sensor on the skin every 14 days. Use to check glucose continuously. Diagnosis E:11.65, Z79.4  Patient not taking: Reported on 09/26/2021   Colon Branch, MD Not Taking Active   FARXIGA 5 MG TABS tablet [id number] Yes TAKE ONE TABLET BY MOUTH ONCE DAILY BEFORE BREAKFAST Shamleffer, Melanie Crazier, MD Taking Active   FEROSUL 325 (65 Fe) MG tablet [id number] Yes TAKE 1 TABLET BY MOUTH DAILY Cirigliano, Vito V, DO Taking Active   FLUoxetine (PROZAC) 40 MG capsule [id number]  Yes Take 1 capsule (40 mg total) by mouth daily. Colon Branch, MD Taking Active   glucose blood [facility] ULTRA) test strip [id number] Yes Check blood sugar 2-3 times daily.  Dx Code: E11.9 Colon Branch, MD Taking Active   losartan (COZAAR) 50 MG tablet [id number] Yes TAKE ONE TABLET BY MOUTH EVERYDAY AT BEDTIME Colon Branch, MD Taking Active   magnesium oxide (MAG-OX) 400 MG tablet [id number] Yes TAKE ONE TABLET BY MOUTH EVERY MORNING and TAKE ONE TABLET BY MOUTH EVERYDAY AT BEDTIME Colon Branch, MD Taking Active   metFORMIN (GLUCOPHAGE) 1000 MG tablet [id number] Yes TAKE ONE TABLET BY MOUTH BY MOUTH EVERY MORNING and TAKE ONE TABLET BY MOUTH EVERYDAY AT BEDTIME Colon Branch, MD Taking Active   nitroGLYCERIN (NITROSTAT) 0.4 MG SL tablet [id number] Yes Place 1 tablet (0.4 mg total) under the tongue every 5 (five) minutes as needed for chest pain. Satira Sark, MD Taking Active            Med Note [facility], Vilma Prader D   Thu Apr 17, 2021  8:36 AM) PRN  NOVOLIN 70/30 KWIKPEN (70-30) 100 UNIT/ML KwikPen [id number] Yes INJECT 18 UNITS into THE SKIN TWICE DAILY WITH A MEAL  Shamleffer, Melanie Crazier, MD Taking Active   pantoprazole (PROTONIX) 40 MG tablet [id number] Yes TAKE ONE TABLET BY MOUTH EVERY MORNING and TAKE ONE TABLET BY MOUTH EVERYDAY AT BEDTIME Colon Branch, MD Taking Active   Potassium 99 MG TABS [id number] Yes Take 1 tablet by mouth daily. [provider] Taking Active Spouse/Significant Other  primidone (MYSOLINE) 50 MG tablet [id number] Yes TAKE THREE TABLETS BY MOUTH EVERY MORNING and TAKE TWO TABLETS BY MOUTH EVERYDAY AT BEDTIME [name], [name], DO Taking Active   vitamin B-12 (CYANOCOBALAMIN) 1000 MCG tablet [id number] Yes Take 1,000 mcg by mouth daily. 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   Abdominal pain 11/17/2018   Cardiac arrhythmia, unspecified 11/14/2017   Hypomagnesemia 11/14/2017   Anemia 11/14/2017   Bleeding skin mole 11/14/2017   OSA on CPAP 09/21/2017   Obesity 01/23/2016   PCP NOTES >>> 06/08/2015   Anxiety and depression 05/31/2013   Intermediate coronary syndrome ([place]) 12/22/2012   Unstable angina ([place]) 12/16/2012   Fatigue 04/26/2012   Annual physical exam 10/28/2011   Rash 10/28/2011   Coronary Artery Disease 08/28/2011   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   Palpitations 08/28/2011   NSTEMI (non-ST elevated myocardial infarction) ([place]) 08/07/2011   DJD (degenerative joint disease) 05/28/2011   Poorly controlled type 2 diabetes mellitus with circulatory disorder ([place])    Hyperlipidemia    HTN (hypertension)     Immunization History  Administered Date(s) Administered   Fluad Quad(high Dose 65+) 05/04/2019   Influenza Split 06/30/2011   Influenza, High Dose Seasonal PF 05/31/2013, 05/27/2017, 06/07/2018   Influenza, Seasonal, Injecte, Preservative Fre 07/28/2012    Influenza,inj,Quad PF,6+ Mos 05/29/2014, 06/07/2015, 04/23/2016   Influenza-Unspecified 05/24/2020, 06/24/2021   PFIZER(Purple Top)SARS-COV-2 Vaccination 10/11/2019, 11/01/2019, 05/20/2020   Pneumococcal Conjugate-13 12/05/2014   Pneumococcal Polysaccharide-23 08/07/2011, 10/12/2018   Tdap 05/31/2013   Zoster Recombinat (Shingrix) 04/17/2021, 06/24/2021   Zoster, Live 11/28/2012    Conditions to be addressed/monitored: CAD, HTN, HLD, DMII, Anxiety,  Depression and B12 deficiency; iron def anemia; ;essential tremor; peripheral neuropathy; hypomagnesemia; h/o gastric bypass surgery; h/o lower GI bleed and GERD  Care Plan : General Pharmacy (Adult)  Updates made by [name], RPH-CPP since 09/26/2021 12:00 AM     Problem: Management of Chronic Conditions: Hypertension, Hyperlipidemia, Heart Disease, Diabetes, Depression, GERD/History of GI Bleed, Essential Tremor, Hypomagnesemia, neuropathy   Priority: High  Onset Date: 11/25/2020  Note:   Current Barriers:  Unable to achieve control of type 2 DM and HTN  Not filling medications on time despite using [facility] packaging system and delivery - improved  Pharmacist Clinical Goal(s):  Over the next 90 days, patient will achieve adherence to monitoring guidelines and medication adherence to achieve therapeutic efficacy achieve control of diabetes as evidenced by A1c <8.0 and BP <140/90 adhere to prescribed medication regimen as evidenced by refill history  through collaboration with PharmD and provider.   Interventions: 1:1 collaboration with Colon Branch, MD regarding development and update of comprehensive plan of care as evidenced by provider attestation and co-signature Inter-disciplinary care team collaboration (see longitudinal plan of care) Comprehensive medication review performed; medication list updated in electronic medical record  Diabetes: Uncontrolled - last A1c was 8.4% (goal is A1c < 8.0%) Current treatment:  Novolin 70/30  18 units with morning meal and 18 units with evening meal Metformin 102m twice daily Farxiga 51mdaily (started 07/28/2021 but ran out 2 days ago) Has tried Trulicity in past - caused nausea (also has h/o gastric bypass surgery and lower GI bleed). Tried jardiance and invokana - Jadiance stopped due to cost and Invokana was non formulary; Lantus taken in past but stopped due to cost.  Current glucose readings: since started Farxiga in 130's to 160's most to the time Lowest blood glucose was 55 (occurred before dose of Novolin 70/30 was lowered from 20 to 18 units twice a day Highest blood glucose was 225 (after he ate cookies) Was prescribed DEXCom CGM per Dr ShKelton Pillarut patient reports his insuarnce declined because he was not injecting insulin 3 or more times per day. Rechecked 2023 benefits and patient can get Freestyle Libre 2 or 3 at $0 at pharmacy under his Part B benefits. [facility] but [facility] told patient in January that was not covered.   No recent hypoglycemia Diet - reports he has been trying to avoid sweets and high carbohydrate foods like bread and potatoes.  Current exercise: walking some, renovating home, yardwork, working in his shop Interventions:  Reviewed home blood glucose readings and reviewed goals  Fasting blood glucose goal (before meals) = 80 to 130 Blood glucose goal after a meal = less than 180  Reviewed 2023 benefits for [employer] again. Verified they are covering [employer] for $0. [facility] resubmission and Libre 3 was $0. ([facility] cannot order yet). Patient to let me know when he has sensors and I will set up time for education.  Screened for Fa[place] assistance program - Household income does not qualify (patient's wife still working) but cost will be $47 per month (at least until reaches coverage gap) and patient is [place].   Hypertension: Uncontrolled but  improving. BP Readings from Last 3 Encounters:  07/23/21 122/64  07/15/21 (!) 174/90  05/12/21 (!) 144/80  Current treatment: Carvedilol 12.58m54mwice daily (filled for 30 day supply 07/18/2021 and 08/21/2021) Losartan 72m47mily (filled for 30 day supply 07/18/2021 and 08/21/2021) Current  home readings: not provided today Adherence with losartan has improved since  November 2022 Interventions:  Counseled on checking blood pressure  at home to get better idea of BP outside of office.  Recommended If blood pressure  continues to be above goal; consider increasing losartan to 158m daily or add HCTZ to regimen  Hyperlipidemia / CAD: Controlled;  Current treatment:  Aspirin 853mdaily Atorvastatin 8052maily (filled for 30 day supply 07/18/2021 and 08/21/2021) Nitroglycerin 0.4mg60m needed Interventions:  Recommended continue current therapy Discussed adherence to maintenance medications - adherence has improved since patient started monthly packaging.  Will continue to follow adherence and intervene as needed.   Depression/Anxiety/: Uncontrolled but improved with change from citalopram to fluoxetine 04/17/2021 Current treatment: Fluoxetine 40mg63make 1 capsule daily   PHQ9 SCORE ONLY 07/23/2021 06/02/2021 05/26/2021  PHQ-9 Total Score 0 0 3  Patient reports he has more energy and has more interests in hobbies and getting out since switch from citalopram to fluoxetine.  Interventions  Recommended continue current therapy  Neuropathy Controlled; B12 checked 07/23/2021 and improved from previous 257 (11/01/2020)  Lab Results  Component Value Date   VITAMINB12 611 07/23/2021  Current regimen:  OTC B12 1000mcg37mly  Interventions:  Consider repeat B12 level 6 to 12 months Discussed that he should consider balance therapy as recommended by Dr [name] (neuro)  Hypomagnesemia Current regimen:  Magnesium oxide 400mg t35m daily Interventions: Consider repeat Magnesium level at  next office visit  Health Maintenance  Interventions: Discussed diet and exercise Continue to be active daily either walking, yardwork Limit snacking and serving sizes.  Patient [facility] COVID [facility]ivalent  [facility] Interventions Comprehensive medication review performed. Utilize  pharmacy for medication synchronization, packaging and delivery Reviewed refill history and assesses adherence. Discussed adherence with patient and his wife.  Coordinated refill for Libre SLehman Brothersient Goals/Self-Care Activities Over the next 90 days, patient will:  take medications as prescribed Adherence strategies to try Place morning and evening medications by toothbrush to remember to take Drink a glass of water every morning and evening and place medications by sink in kitchen to improve adherence Set an alarm / reminder on your phone for morning and evening medication doses.  check glucose 2 to 3 times a day, document, and provide at future appointments,  check blood pressure 1 to 2 times per week, document, and provide at future appointments engage in dietary modifications by decreasing intake of high sugar / CHO snacks and limiting serving sizes Pick up Freestyle 3 glucose sensors from [employer] [employer] me to set up time for teaching you how to use.   Follow Up Plan: Telephone follow up appointment with care management team member scheduled for:  1 month       Medication Assistance:  Screened for FarxigaIrant assistance program - household income did not qualify (patient's wife is still working and income too high)   [facility] [place] [phone] pill box? No - using packaging from [facility] Adherence is about 95% - working with patient and  his wife to increase adherence.    Follow Up:  Patient agrees to Care Plan and Follow-up.  Plan: Telephone follow up appointment with care management team member scheduled for:  30 days with clinical pharmacist.   [name], PharmD Clinical Pharmacist [facility] [phone]

## 2021-09-26 NOTE — Patient Instructions (Signed)
Randy Castro It was a pleasure speaking with you today.  I have attached a summary of our visit today and information about your health goals.    Patient Goals/Self-Care Activities Over the next 90 days, patient will:  take medications as prescribed Adherence strategies to try Place morning and evening medications by toothbrush to remember to take Drink a glass of water every morning and evening and place medications by sink in kitchen to improve adherence Set an alarm / reminder on your phone for morning and evening medication doses.  check glucose 2 to 3 times a day, document, and provide at future appointments,  check blood pressure 1 to 2 times per week, document, and provide at future appointments engage in dietary modifications by decreasing intake of high sugar / CHO snacks and limiting serving sizes Pick up Freestyle 3 glucose sensors from [employer] - call me to set up time for teaching you how to use.   If you have any questions or concerns, please feel free to contact me either at the phone number below or with a MyChart message.   Keep up the good work!  Randy Castro, PharmD Clinical Pharmacist [facility] [phone] (direct line)  [phone] (main office number)  CARE PLAN ENTRY    Hypertension BP Readings from Last 3 Encounters:  07/23/21 122/64  07/15/21 (!) 174/90  05/12/21 (!) 144/80   Pharmacist Clinical Goal(s): Over the next 90 days, patient will work with PharmD and providers to achieve BP goal <140/90 Current regimen:  Carvedilol 12.5mg  twice daily Losartan 50mg  daily Interventions: Requested patient check his blood pressure 1 to 2 times per week and record Patient self care activities - Over the next 90 days, patient will: Check blood pressure 1 to 2  times per week, document, and provide at future appointments Ensure daily salt intake < 2300 mg/day Important to take medications for blood pressure EVERY day to  lower blood pressure to recommended goal.  Hyperlipidemia/CAD Lab Results  Component Value Date/Time   LDLCALC 46 04/17/2021 09:19 AM   LDLDIRECT 145.0 04/26/2018 10:26 AM   Pharmacist Clinical Goal(s): Over the next 90 days, patient will work with PharmD and providers to maintain LDL goal < 70  Current regimen:  Aspirin 81mg  daily Atorvastatin 80mg  daily Nitroglycerin 0.4mg  as needed Patient self care activities - Over the next 90 days, patient will: Maintain cholesterol medication regimen.   Diabetes Lab Results  Component Value Date/Time   HGBA1C 9.1 (A) 07/15/2021 09:29 AM   HGBA1C 8.4 (A) 01/07/2021 09:17 AM   HGBA1C 9.7 (H) 04/01/2020 09:10 AM   HGBA1C 7.7 09/12/2019 12:00 AM   HGBA1C 8.2 04/20/2019 12:00 AM   Pharmacist Clinical Goal(s): Over the next 90 days, patient will work with PharmD and providers to achieve A1c goal <8% while preventing increase episodes of hypoglycemia in coordination with patient's endocrinologist Current regimen:  Novolin 70/30 18 units with breakfast and 18 units with evening meal Metformin 1000mg  twice daily Farxiga 5mg  daily  Interventions: Discussed diet and exercise Reviewed home blood glucose readings and reviewed goals  Fasting blood glucose goal (before meals) = 80 to 130 Blood glucose goal after a meal = less than 180 Reviewed 2023 benefits for [employer] and appears for 2023 they are covering [employer] sensors for $0. [employer] [facility] and they were able to process Freestyle Libre sensor Rx for $0 Patient self care activities - Over the next 90 days, patient will: Contact provider with any episodes  of hypoglycemia Decreased sweet snacks like banana pudding and cookies; instead try smaller servings size (1/2 cup) or lower carbohydrate snack (popcorn, veggies, low fat cheese) Let me know when you have Freestyle Libre sensors and we can set up a time to teach you how to use. [name] [name]  [phone])  Neuropathy Pharmacist Clinical Goal(s) Over the next 90 days, patient will work with PharmD and providers to maintain magnesium within normal limits and decrease neuropathy Current regimen:  OTC B12 1051mcg daily  Interventions: Consider repeat B12 in 6 to 12 months Consider balance therapy as recommended by Dr Carles Collet (neurologies) Patient self care activities - Over the next 90 days, patient will: Continue B12 1084mcg daily Complete B12 level in 6 to 12 months Recommend balance therapy to prevent future falls.   Hypomagnesemia Pharmacist Clinical Goal(s) Over the next 90 days, patient will work with PharmD and providers to maintain magnesium within normal limits Current regimen:  Magnesium oxide 400mg  twice daily Interventions: Consider repeat Magnesium level at next office visit Patient self care activities - Over the next 90 days, patient will: Maintain magnesium medication regimen Complete magnesium level at next office visit  Health Maintenance  Pharmacist Clinical Goal(s) Over the next 90 days, patient will work with PharmD and providers to complete health maintenance screenings/vaccinations Interventions: Discussed diet and exercise Patient self care activities - Over the next 90 days, patient will: Continue to be active daily either walking, yardwork Limit snacking and serving sizes.   Medication management Pharmacist Clinical Goal(s): Over the next 90 days, patient will work with PharmD and providers to maintain optimal medication adherence Current pharmacy: [facility] Comprehensive medication review performed. Utilize pharmacy for medication synchronization, packaging and delivery Coordinated refill for Wilder Glade - will be delivered to patient Tuesday, January 10. Patient self care activities - Over the next 90 days, patient will: Focus on medication adherence  Adherence strategies to try: Place morning and evening medications by  toothbrush to remember to take Drink a glass of water every morning and evening and place medications by sink in kitchen to improve adherence Set an alarm / reminder on your phone for morning and evening medication doses.  Take medications as prescribed Report any questions or concerns to PharmD and/or provider(s)    Patient Goals/Self-Care Activities Over the next 90 days, patient will:  take medications as prescribed Adherence strategies to try Place morning and evening medications by toothbrush to remember to take Drink a glass of water every morning and evening and place medications by sink in kitchen to improve adherence Set an alarm / reminder on your phone for morning and evening medication doses.  check glucose 2 to 3 times a day, document, and provide at future appointments,  check blood pressure 1 to 2 times per week, document, and provide at future appointments engage in dietary modifications by decreasing intake of high sugar / CHO snacks and limiting serving sizes Pick up Freestyle 3 glucose sensors from [employer] - call me to set up time for teaching you how to use.   Patient verbalizes understanding of instructions and care plan provided today and agrees to view in [place]. Active MyChart status confirmed with patient.

## 2021-10-02 ENCOUNTER — Encounter: Payer: Self-pay | Admitting: Internal Medicine

## 2021-10-11 DIAGNOSIS — R42 Dizziness and giddiness: Secondary | ICD-10-CM | POA: Diagnosis not present

## 2021-10-17 ENCOUNTER — Ambulatory Visit: Payer: PPO | Admitting: Pharmacist

## 2021-10-17 DIAGNOSIS — I10 Essential (primary) hypertension: Secondary | ICD-10-CM

## 2021-10-17 DIAGNOSIS — E1142 Type 2 diabetes mellitus with diabetic polyneuropathy: Secondary | ICD-10-CM

## 2021-10-17 DIAGNOSIS — I2510 Atherosclerotic heart disease of native coronary artery without angina pectoris: Secondary | ICD-10-CM

## 2021-10-17 DIAGNOSIS — E7800 Pure hypercholesterolemia, unspecified: Secondary | ICD-10-CM

## 2021-10-17 DIAGNOSIS — R42 Dizziness and giddiness: Secondary | ICD-10-CM | POA: Diagnosis not present

## 2021-10-17 NOTE — Chronic Care Management (AMB) (Signed)
Chronic Care Management Pharmacy Note  10/17/2021 Name:  Randy Castro. MRN:  [id number] DOB:  Jan 09, 1951  Summary:  Patient started Wilder Glade and reports blood glucose improved to 130's and 140's most day.  Patient c/o feeling "swimmy headed" the last 1 or 2 weeks. He and his wife spoke with [facility] nurse by phone a few days ago and she mentioned meclizine. Patient denies that dizziness is related to low blood glucose or low blood pressure. Occurs sometimes when he turns his head. Patient is currently coming home from beach today.   Plan: Made appointment to see PCP 10/21/2021 to evaluate dizziness. Recommended he can try over-the-counter meclizine 12.9m up to 3 times a day for dizziness until appointment with PCP next week.  If worsens over weekend, he should present to urgent care / ED.   Subjective: HShanan Castro is an 71y.o. year old male who is a primary patient of Paz, JAlda Berthold MD.  The CCM team was consulted for assistance with disease management and care coordination needs.    Engaged with patient by telephone for follow up visit in response to provider referral for pharmacy case management and/or care coordination services.   Consent to Services:  The patient was given information about Chronic Care Management services, agreed to services, and gave verbal consent prior to initiation of services.  Please see initial visit note for detailed documentation.   Patient Care Team: PColon Branch MD as PCP - General (Internal Medicine) MSatira Sark MD as PCP - Cardiology (Cardiology) MLarey Dresser MD as Consulting Physician (Cardiology) LLake Bells, MD as Referring Physician (Gastroenterology) MGearlean Alf PA-C as Physician Assistant (Pulmonary Disease) TDemetrius Revel MD as Consulting Physician (Bariatrics) [name], REustace Quail DO as Consulting Physician (Neurology) ECherre Robins RRedfield(Pharmacist) CMadelin Headings DO (Optometry)  Recent office  visits: 07/23/2021 - Int Med (Dr PLarose Kells F/U anxiety and depression. Improved with fluoxetine 436mdaily. BP was at goal. F/U 3 to 4 months 04/17/2021 - PCP (Dr PaLarose Kells- Follow up chronic conditions. Expressed increased anxieyt and fatigue. Changed citalopram 2072mo fluoxetine 108m71mily for 3 weeks, then increase to 40mg27mly.  Recent consult visits: 07/15/2021 - Endo (Dr ShamlKelton Pillar diabetes. A1c was 9.1. Started Farxiga 5mg d78my before breakfast. BP also noted to be elevated.  05/12/2021 - Cardio (Dr McDoweDomenic Politeine cardio f/u. No med changes. Suggested discussing SGLT2 with either endo or PCP for DM and cardiac benefits.  04/24/2021 - Neuro (Dr [name]) F/U essential tremor, gait instability, B12 def. No med changes noted. 01/07/2021 - Endo (Shamleffer) F/U type 2 DM; Changed Novolin 70/30 insulin from 18 units bid to 14 units prior to breakfast and 18 units prior to supper. Dexcom also prescribed.   Hospital visits: None in previous 6 months  Objective:  Lab Results  Component Value Date   CREATININE 0.64 04/17/2021   CREATININE 0.71 10/03/2020   CREATININE 0.66 04/15/2020    Lab Results  Component Value Date   HGBA1C 9.1 (A) 07/15/2021   Last diabetic Eye exam:  Lab Results  Component Value Date/Time   HMDIABEYEEXA Retinopathy (A) 07/19/2019 12:00 AM    Last diabetic Foot exam:  Lab Results  Component Value Date/Time   HMDIABFOOTEX Done 03/19/2019 12:00 AM        Component Value Date/Time   CHOL 110 04/17/2021 0919   TRIG 102.0 04/17/2021 0919   HDL 43.80 04/17/2021 0919   CHOLHDL 3 04/17/2021 0919  VLDL 20.4 04/17/2021 0919   LDLCALC 46 04/17/2021 0919   LDLDIRECT 145.0 04/26/2018 1026    Hepatic Function Latest Ref Rng & Units 07/23/2021 10/22/2020 10/03/2020  Total Protein 6.0 - 8.3 g/dL 5.9(L) 5.9(L) 6.1  Albumin 3.5 - 5.2 g/dL 3.9 - 4.0  AST 0 - 37 U/L 26 - 37  ALT 0 - 53 U/L 37 - 47  Alk Phosphatase 39 - 117 U/L 60 - 69  Total Bilirubin 0.2 - 1.2 mg/dL  0.5 - 0.5  Bilirubin, Direct 0.0 - 0.3 mg/dL 0.1 - -    Lab Results  Component Value Date/Time   TSH 1.11 04/17/2021 09:19 AM   TSH 0.92 04/26/2018 10:26 AM    CBC Latest Ref Rng & Units 04/17/2021 10/03/2020 04/01/2020  WBC 4.0 - 10.5 K/uL 6.3 6.0 6.5  Hemoglobin 13.0 - 17.0 g/dL 12.3(L) 12.6(L) 11.8(L)  Hematocrit 39.0 - 52.0 % 36.5(L) 37.9(L) 35.1(L)  Platelets 150.0 - 400.0 K/uL 236.0 226.0 246.0    No results found for: VD25OH  Clinical ASCVD: Yes  The ASCVD Risk score (Arnett DK, et al., 2019) failed to calculate for the following reasons:   The patient has a prior MI or stroke diagnosis     Social History   Tobacco Use  Smoking Status Former   Packs/day: 1.00   Years: 25.00   Pack years: 25.00   Types: Cigarettes   Quit date: 04/12/1992   Years since quitting: 29.5  Smokeless Tobacco Never   BP Readings from Last 3 Encounters:  07/23/21 122/64  07/15/21 (!) 174/90  05/12/21 (!) 144/80   Pulse Readings from Last 3 Encounters:  07/23/21 64  07/15/21 (!) 56  05/12/21 (!) 55   Wt Readings from Last 3 Encounters:  07/23/21 219 lb 2 oz (99.4 kg)  07/15/21 221 lb 3.2 oz (100.3 kg)  06/02/21 219 lb (99.3 kg)    Assessment: Review of patient past medical history, allergies, medications, health status, including review of consultants reports, laboratory and other test data, was performed as part of comprehensive evaluation and provision of chronic care management services.   SDOH:  (Social Determinants of Health) assessments and interventions performed:     CCM Care Plan  Allergies  Allergen Reactions   Trulicity [Dulaglutide] Nausea And Vomiting    Medications Reviewed Today     Reviewed by [name], RPH-CPP (Pharmacist) on 10/17/21 at [phone]  Med List Status: <None>   Medication Order Taking? Sig Documenting Provider Last Dose Status Informant  aspirin EC 81 MG tablet [id number] Yes Take 81 mg by mouth daily. [provider] Taking Active    atorvastatin (LIPITOR) 80 MG tablet [id number] Yes TAKE ONE TABLET BY MOUTH EVERYDAY AT BEDTIME Colon Branch, MD Taking Active   Blood Glucose Monitoring Suppl (ONE TOUCH ULTRA 2) w/Device KIT [id number] Yes by Does not apply route. [provider] Taking Active   carvedilol (COREG) 12.5 MG tablet [id number] Yes TAKE ONE TABLET BY MOUTH EVERY MORNING and TAKE ONE TABLET BY MOUTH EVERYDAY AT BEDTIME Colon Branch, MD Taking Active   clotrimazole-betamethasone (LOTRISONE) cream [id number] Yes Apply topically 2 (two) times daily as needed. Colon Branch, MD Taking Active   Continuous Blood Gluc Sensor (FREESTYLE LIBRE 3 SENSOR) [place] [id number] No 1 each by Does not apply route every 14 (fourteen) days. Place 1 sensor on the skin every 14 days. Use to check glucose continuously. Diagnosis E:11.65, Z79.4  Patient not taking: Reported on 10/17/2021   Colon Branch,  MD Not Taking Active   FARXIGA 5 MG TABS tablet [id number] Yes TAKE ONE TABLET BY MOUTH ONCE DAILY BEFORE BREAKFAST Shamleffer, Melanie Crazier, MD Taking Active   FEROSUL 325 (65 Fe) MG tablet [id number] Yes TAKE 1 TABLET BY MOUTH DAILY Cirigliano, Vito V, DO Taking Active   FLUoxetine (PROZAC) 40 MG capsule [id number] Yes Take 1 capsule (40 mg total) by mouth daily. Colon Branch, MD Taking Active   glucose blood [facility] ULTRA) test strip [id number] Yes Check blood sugar 2-3 times daily.  Dx Code: E11.9 Colon Branch, MD Taking Active   losartan (COZAAR) 50 MG tablet [id number] Yes TAKE ONE TABLET BY MOUTH EVERYDAY AT BEDTIME Colon Branch, MD Taking Active   magnesium oxide (MAG-OX) 400 MG tablet [id number] Yes TAKE ONE TABLET BY MOUTH EVERY MORNING and TAKE ONE TABLET BY MOUTH EVERYDAY AT BEDTIME Colon Branch, MD Taking Active   metFORMIN (GLUCOPHAGE) 1000 MG tablet [id number] Yes TAKE ONE TABLET BY MOUTH BY MOUTH EVERY MORNING and TAKE ONE TABLET BY MOUTH EVERYDAY AT BEDTIME Colon Branch, MD Taking Active   nitroGLYCERIN (NITROSTAT) 0.4 MG SL tablet  [id number] Yes Place 1 tablet (0.4 mg total) under the tongue every 5 (five) minutes as needed for chest pain. Satira Sark, MD Taking Active            Med Note [facility], Vilma Prader D   Thu Apr 17, 2021  8:36 AM) PRN  NOVOLIN 70/30 KWIKPEN (70-30) 100 UNIT/ML KwikPen [id number] Yes INJECT 18 UNITS into THE SKIN TWICE DAILY WITH A MEAL  Patient taking differently: 16 Units in the morning and at bedtime. With a meal   Shamleffer, Melanie Crazier, MD Taking Active   pantoprazole (PROTONIX) 40 MG tablet [id number] Yes TAKE ONE TABLET BY MOUTH EVERY MORNING and TAKE ONE TABLET BY MOUTH EVERYDAY AT BEDTIME Colon Branch, MD Taking Active   Potassium 99 MG TABS [id number] Yes Take 1 tablet by mouth daily. [provider] Taking Active Spouse/Significant Other  primidone (MYSOLINE) 50 MG tablet [id number] Yes TAKE THREE TABLETS BY MOUTH EVERY MORNING and TAKE TWO TABLETS BY MOUTH EVERYDAY AT BEDTIME [name], [name], DO Taking Active   vitamin B-12 (CYANOCOBALAMIN) 1000 MCG tablet [id number] Yes Take 1,000 mcg by mouth daily. 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   Abdominal pain 11/17/2018   Cardiac arrhythmia, unspecified 11/14/2017   Hypomagnesemia 11/14/2017   Anemia 11/14/2017   Bleeding skin mole 11/14/2017   OSA on CPAP 09/21/2017   Obesity 01/23/2016   PCP NOTES >>> 06/08/2015   Anxiety and depression 05/31/2013   Intermediate coronary syndrome ([place]) 12/22/2012   Unstable angina ([place]) 12/16/2012   Fatigue 04/26/2012   Annual physical exam 10/28/2011   Rash 10/28/2011   Coronary Artery Disease 08/28/2011   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   Palpitations 08/28/2011   NSTEMI (non-ST elevated myocardial infarction) ([place]) 08/07/2011   DJD  (degenerative joint disease) 05/28/2011   Poorly controlled type 2 diabetes mellitus with circulatory disorder ([place])    Hyperlipidemia    HTN (hypertension)     Immunization History  Administered Date(s) Administered   Fluad Quad(high Dose 65+) 05/04/2019   Influenza Split 06/30/2011   Influenza,  High Dose Seasonal PF 05/31/2013, 05/27/2017, 06/07/2018   Influenza, Seasonal, Injecte, Preservative Fre 07/28/2012   Influenza,inj,Quad PF,6+ Mos 05/29/2014, 06/07/2015, 04/23/2016   Influenza-Unspecified 05/24/2020, 06/24/2021   PFIZER(Purple Top)SARS-COV-2 Vaccination 10/11/2019, 11/01/2019, 05/20/2020   Pneumococcal Conjugate-13 12/05/2014   Pneumococcal Polysaccharide-23 08/07/2011, 10/12/2018   Tdap 05/31/2013   Zoster Recombinat (Shingrix) 04/17/2021, 06/24/2021   Zoster, Live 11/28/2012    Conditions to be addressed/monitored: CAD, HTN, HLD, DMII, Anxiety, Depression and B12 deficiency; iron def anemia; ;essential tremor; peripheral neuropathy; hypomagnesemia; h/o gastric bypass surgery; h/o lower GI bleed and GERD  Care Plan : General Pharmacy (Adult)  Updates made by [name], RPH-CPP since 10/17/2021 12:00 AM     Problem: Management of Chronic Conditions: Hypertension, Hyperlipidemia, Heart Disease, Diabetes, Depression, GERD/History of GI Bleed, Essential Tremor, Hypomagnesemia, neuropathy   Priority: High  Onset Date: 11/25/2020  Note:   Current Barriers:  Unable to achieve control of type 2 DM and HTN  Reports feeling "swimmy" headed   Pharmacist Clinical Goal(s):  Over the next 90 days, patient will achieve adherence to monitoring guidelines and medication adherence to achieve therapeutic efficacy achieve control of diabetes as evidenced by A1c <8.0 and BP <140/90 adhere to prescribed medication regimen as evidenced by refill history  through collaboration with PharmD and provider.   Interventions: 1:1 collaboration with Colon Branch, MD regarding development and  update of comprehensive plan of care as evidenced by provider attestation and co-signature Inter-disciplinary care team collaboration (see longitudinal plan of care) Comprehensive medication review performed; medication list updated in electronic medical record  Diabetes: Uncontrolled - last A1c was 8.4% (goal is A1c < 8.0%) Current treatment:  Novolin 70/30 16 units with morning meal and 16 units with evening meal Metformin 102m twice daily Farxiga 514mdaily (started 1294/07/6808Has tried Trulicity in past - caused nausea (also has h/o gastric bypass surgery and lower GI bleed). Tried jardiance and invokana - Jadiance stopped due to cost and Invokana was non formulary; Lantus taken in past but stopped due to cost.  Current glucose readings: since started Farxiga in 120's to 150's most of the time Lowest blood glucose was 75 (patient has since lowered dose of Novolin 70/30 from 18 units twice a day to 16 units twice a day) Highest blood glucose was 200  At beginning of 2023 [place]ened for [place] program - Household income does not qualify (patient's wife still working) but cost will be $47 per month (at least until reaches coverage gap) and [place]. (I also had discussed Heart and Diabetes plan with [employer] that covered all diabetes meds at $0 but patient decided not continue with current plan)  Was prescribed DEXCom CGM per Dr ShKelton Pillarut patient reports his insuarnce declined because he was not injecting insulin 3 or more times per day. Rechecked 2023 benefits and patient can get Freestyle Libre 2 or 3 at $0 at pharmacy under his Part B benefits. Patient is working with [facility] to get Continuous Glucose Monitor. Reviewed 2023 benefits for [employer] again. Verified they are covering Freestyle Libre sensors for $0.  Diet - reports he has been trying to avoid sweets and high carbohydrate foods like bread and potatoes.  Current exercise: walking  some, renovating home, yardwork, working in his shop Interventions:  Reviewed home blood glucose readings and reviewed goals  Fasting blood glucose goal (before meals) = 80 to 130 Blood glucose goal after a meal = less than 180   Follow up  with Dr Kelton Pillar as planned 11/13/2021  Hypertension: Uncontrolled but improving. BP Readings from Last 3 Encounters:  07/23/21 122/64  07/15/21 (!) 174/90  05/12/21 (!) 144/80  Current treatment: Carvedilol 12.77m twice daily Losartan 554mdaily  Current home readings: not provided today Adherence with losartan has improved since  November 2022 Interventions:  Counseled on checking blood pressure  at home to get better idea of BP outside of office.  Recommended If blood pressure  continues to be above goal; consider increasing losartan to 10066maily or add HCTZ to regimen  Hyperlipidemia / CAD: Controlled;  Current treatment:  Aspirin 59m93mily Atorvastatin 80mg35mly  Nitroglycerin 0.4mg a41meeded Interventions:  Recommended continue current therapy Discussed adherence to maintenance medications - adherence has improved since patient started monthly packaging.  Will continue to follow adherence and intervene as needed.   Depression/Anxiety/: Uncontrolled but improved with change from citalopram to fluoxetine 04/17/2021 Current treatment: Fluoxetine 40mg -62me 1 capsule daily   PHQ9 SCORE ONLY 07/23/2021 06/02/2021 05/26/2021  PHQ-9 Total Score 0 0 3  Patient reports he has more energy and has more interests in hobbies and getting out since switch from citalopram to fluoxetine.  Interventions  Recommended continue current therapy  Neuropathy Controlled; B12 checked 07/23/2021 and improved from previous 257 (11/01/2020)  Lab Results  Component Value Date   VITAMINB12 611 07/23/2021  Patient reports he has been feeling "swimmy headed the last 1 or 2 weeks. [facility] and [facility]  meclizine.  Patient denies that dizziness is related to low blood glucose or low blood pressure. Occurs sometimes when he turns his head. Patient is currently coming home from beach today.  Current regimen:  OTC B12 1000mcg d17m  Interventions:  Consider repeat B12 level 6 to 12 months Discussed that he should consider balance therapy as recommended by Dr [name] (neuro) Made appointment to see PCP 10/21/2021 to evaluate dizziness.  Can try over-the-counter meclizine 12.5mg up t37m times a day for dizziness until appointment with PCP next week.  If worsens over weekend, he should present to urgent care / ED.   Hypomagnesemia Current regimen:  Magnesium oxide 400mg twic31mily Interventions: Consider repeat Magnesium level at next office visit  Health Maintenance  Interventions: Discussed diet and exercise Continue to be active daily either walking, yardwork Limit snacking and serving sizes.  Reminded to get annual [facility] - [facility] exam to be faxed to our office.  Medication management Current pharmacy: [facility] Interventions Comprehensive medication review performed. Utilize  pharmacy for medication synchronization, packaging and delivery Reviewed refill history and assesses adherence. Discussed adherence with patient and his wife.    Patient Goals/Self-Care Activities Over the next 90 days, patient will:  take medications as prescribed Adherence strategies to try Place morning and evening medications by toothbrush to remember to take Drink a glass of water every morning and evening and place medications by sink in kitchen to improve adherence Set an alarm / reminder on your phone for morning and evening medication doses.  check glucose 2 to 3 times a day, document, and provide at future appointments,  check blood pressure 1 to 2 times per week, document, and provide at future  appointments engage in dietary modifications by decreasing intake of high sugar / CHO snacks and limiting serving sizes See Dr Paz 02/28/Larose Kells3 for evaluation of dizziness.  Follow Up Plan: Telephone follow up appointment with care management team member scheduled for:  1 to 2 months       Medication Assistance:  Screened for [place] patient assistance program - household income did not qualify (patient's wife is still working and income too high)   Patient's preferred pharmacy is:  [facility] - [place], [place] - [address] [place] [address] Phone: [phone] Fax: [place] [facility], [place] AT [address] [place] [address] Phone: [phone] Fax: [phone]   Uses pill box? No - using packaging from [facility] Adherence is about 98% - working with patient and his wife to increase adherence.    Follow Up:  Patient agrees to Care Plan and Follow-up.  Plan: Telephone follow up appointment with care management team member scheduled for:  1 to 2 months  [name], PharmD Clinical Pharmacist [place] [phone]

## 2021-10-17 NOTE — Patient Instructions (Addendum)
Mr. Ressler, It was a pleasure speaking with you today.  I have attached a summary of our visit today and information about your health goals.  (See Below)   Patient Goals/Self-Care Activities Over the next 90 days, patient will:  take medications as prescribed Adherence strategies to try Place morning and evening medications by toothbrush to remember to take Drink a glass of water every morning and evening and place medications by sink in kitchen to improve adherence Set an alarm / reminder on your phone for morning and evening medication doses.  check glucose 2 to 3 times a day, document, and provide at future appointments,  check blood pressure 1 to 2 times per week, document, and provide at future appointments engage in dietary modifications by decreasing intake of high sugar / CHO snacks and limiting serving sizes See Dr Larose Kells 10/21/2021 for evaluation of dizziness.  If you have any questions or concerns, please feel free to contact me either at the phone number below or with a MyChart message.   Keep up the good work!  [name], PharmD Clinical Pharmacist [facility] [phone] (direct line)  [phone] (main office number)   Chronic Care Management Care Plan (updated 10/17/2021)  Hypertension BP Readings from Last 3 Encounters:  07/23/21 122/64  07/15/21 (!) 174/90  05/12/21 (!) 144/80   Pharmacist Clinical Goal(s): Over the next 90 days, patient will work with PharmD and providers to achieve BP goal <140/90 Current regimen:  Carvedilol 12.5mg  twice daily Losartan 50mg  daily Interventions: Requested patient check his blood pressure 1 to 2 times per week and record Patient self care activities - Over the next 90 days, patient will: Check blood pressure 1 to 2  times per week, document, and provide at future appointments Ensure daily salt intake < 2300 mg/day Important to take medications for blood pressure EVERY day to lower  blood pressure to recommended goal.  Hyperlipidemia/CAD Lab Results  Component Value Date/Time   LDLCALC 46 04/17/2021 09:19 AM   LDLDIRECT 145.0 04/26/2018 10:26 AM   Pharmacist Clinical Goal(s): Over the next 90 days, patient will work with PharmD and providers to maintain LDL goal < 70  Current regimen:  Aspirin 81mg  daily Atorvastatin 80mg  daily Nitroglycerin 0.4mg  as needed Patient self care activities - Over the next 90 days, patient will: Maintain cholesterol medication regimen.   Diabetes Lab Results  Component Value Date/Time   HGBA1C 9.1 (A) 07/15/2021 09:29 AM   HGBA1C 8.4 (A) 01/07/2021 09:17 AM   HGBA1C 9.7 (H) 04/01/2020 09:10 AM   HGBA1C 7.7 09/12/2019 12:00 AM   HGBA1C 8.2 04/20/2019 12:00 AM   Pharmacist Clinical Goal(s): Over the next 90 days, patient will work with PharmD and providers to achieve A1c goal <8% while preventing increase episodes of hypoglycemia in coordination with patient's endocrinologist Current regimen:  Novolin 70/30 16 units with breakfast and 16 units with evening meal Metformin 1000mg  twice daily Farxiga 5mg  daily  Interventions: Discussed diet and exercise Reviewed home blood glucose readings and reviewed goals  Fasting blood glucose goal (before meals) = 80 to 130 Blood glucose goal after a meal = less than 180 Reviewed 2023 benefits for [employer] and appears for 2023 they are covering [employer] sensors for $0. [employer] and they were able to process Freestyle Libre sensor Rx for $0 Patient self care activities - Over the next 90 days, patient will: Contact provider with any episodes of hypoglycemia Decreased sweet snacks like banana pudding and  cookies; instead try smaller servings size (1/2 cup) or lower carbohydrate snack (popcorn, veggies, low fat cheese) Let me know when you have Freestyle Libre sensors and we can set up a time to teach you how to use. [name] [name]  [phone])  Neuropathy Pharmacist Clinical Goal(s) Over the next 90 days, patient will work with PharmD and providers to maintain magnesium within normal limits and decrease neuropathy Current regimen:  OTC B12 1084mcg daily  Interventions: Consider repeat B12 in 6 to 12 months Consider balance therapy as recommended by Dr Carles Collet (neurologies) Patient self care activities - Over the next 90 days, patient will: Continue B12 1024mcg daily Complete B12 level in 6 to 12 months Recommend balance therapy to prevent future falls.  Appointment made with Dr Larose Kells 10/21/2021 at 10:40am to evaluate dizziness  Hypomagnesemia Pharmacist Clinical Goal(s) Over the next 90 days, patient will work with PharmD and providers to maintain magnesium within normal limits Current regimen:  Magnesium oxide 400mg  twice daily Interventions: Consider repeat Magnesium level at next office visit Patient self care activities - Over the next 90 days, patient will: Maintain magnesium medication regimen Complete magnesium level at next office visit  Health Maintenance  Pharmacist Clinical Goal(s) Over the next 90 days, patient will work with PharmD and providers to complete health maintenance screenings/vaccinations Interventions: Discussed diet and exercise Patient self care activities - Over the next 90 days, patient will: Continue to be active daily either walking, yardwork Limit snacking and serving sizes.   Medication management Pharmacist Clinical Goal(s): Over the next 90 days, patient will work with PharmD and providers to maintain optimal medication adherence Current pharmacy: [facility] Comprehensive medication review performed. Utilize pharmacy for medication synchronization, packaging and delivery Patient self care activities - Over the next 90 days, patient will: Focus on medication adherence  Adherence strategies to try: Place morning and evening medications by toothbrush to  remember to take Drink a glass of water every morning and evening and place medications by sink in kitchen to improve adherence Set an alarm / reminder on your phone for morning and evening medication doses.  Take medications as prescribed Report any questions or concerns to PharmD and/or provider(s)     Patient verbalizes understanding of instructions and care plan provided today and agrees to view in [place]. Active MyChart status confirmed with patient.

## 2021-10-20 ENCOUNTER — Encounter: Payer: Self-pay | Admitting: Internal Medicine

## 2021-10-21 ENCOUNTER — Encounter: Payer: Self-pay | Admitting: Internal Medicine

## 2021-10-21 ENCOUNTER — Ambulatory Visit (INDEPENDENT_AMBULATORY_CARE_PROVIDER_SITE_OTHER): Payer: PPO | Admitting: Internal Medicine

## 2021-10-21 VITALS — BP 144/74 | HR 62 | Temp 98.0°F | Resp 16 | Ht 74.0 in | Wt 214.2 lb

## 2021-10-21 DIAGNOSIS — Z794 Long term (current) use of insulin: Secondary | ICD-10-CM

## 2021-10-21 DIAGNOSIS — R42 Dizziness and giddiness: Secondary | ICD-10-CM

## 2021-10-21 DIAGNOSIS — E7800 Pure hypercholesterolemia, unspecified: Secondary | ICD-10-CM | POA: Diagnosis not present

## 2021-10-21 DIAGNOSIS — E1142 Type 2 diabetes mellitus with diabetic polyneuropathy: Secondary | ICD-10-CM | POA: Diagnosis not present

## 2021-10-21 DIAGNOSIS — I2510 Atherosclerotic heart disease of native coronary artery without angina pectoris: Secondary | ICD-10-CM | POA: Diagnosis not present

## 2021-10-21 DIAGNOSIS — E538 Deficiency of other specified B group vitamins: Secondary | ICD-10-CM

## 2021-10-21 DIAGNOSIS — I10 Essential (primary) hypertension: Secondary | ICD-10-CM | POA: Diagnosis not present

## 2021-10-21 LAB — CBC WITH DIFFERENTIAL/PLATELET
Basophils Absolute: 0.1 10*3/uL (ref 0.0–0.1)
Basophils Relative: 0.9 % (ref 0.0–3.0)
Eosinophils Absolute: 0.3 10*3/uL (ref 0.0–0.7)
Eosinophils Relative: 4.1 % (ref 0.0–5.0)
HCT: 37.1 % — ABNORMAL LOW (ref 39.0–52.0)
Hemoglobin: 12.4 g/dL — ABNORMAL LOW (ref 13.0–17.0)
Lymphocytes Relative: 19.7 % (ref 12.0–46.0)
Lymphs Abs: 1.5 10*3/uL (ref 0.7–4.0)
MCHC: 33.4 g/dL (ref 30.0–36.0)
MCV: 86.5 fl (ref 78.0–100.0)
Monocytes Absolute: 0.6 10*3/uL (ref 0.1–1.0)
Monocytes Relative: 8 % (ref 3.0–12.0)
Neutro Abs: 5.3 10*3/uL (ref 1.4–7.7)
Neutrophils Relative %: 67.3 % (ref 43.0–77.0)
Platelets: 258 10*3/uL (ref 150.0–400.0)
RBC: 4.29 Mil/uL (ref 4.22–5.81)
RDW: 14.3 % (ref 11.5–15.5)
WBC: 7.8 10*3/uL (ref 4.0–10.5)

## 2021-10-21 LAB — BASIC METABOLIC PANEL
BUN: 11 mg/dL (ref 6–23)
CO2: 29 mEq/L (ref 19–32)
Calcium: 9.2 mg/dL (ref 8.4–10.5)
Chloride: 105 mEq/L (ref 96–112)
Creatinine, Ser: 0.8 mg/dL (ref 0.40–1.50)
GFR: 89.89 mL/min (ref >60.00)
Glucose, Bld: 133 mg/dL — ABNORMAL HIGH (ref 70–99)
Potassium: 4.5 mEq/L (ref 3.5–5.1)
Sodium: 143 mEq/L (ref 135–145)

## 2021-10-21 LAB — B12 AND FOLATE PANEL
Folate: >24.2 ng/mL (ref >5.9)
Vitamin B-12: 559 pg/mL (ref 211–911)

## 2021-10-21 NOTE — Patient Instructions (Signed)
Check the  blood pressure regularly BP GOAL is between 110/65 and  135/85. If it is consistently higher or lower, let me know  We will schedule a brain MRI  If you have severe symptoms or something like slurred speech, double vision: Go to the emergency room  GO TO THE LAB : Get the blood work     [place], [place] back for   a checkup in 6 weeks

## 2021-10-21 NOTE — Assessment & Plan Note (Signed)
Dizziness: As described above, worse for several weeks, orthostatic vital signs negative.  No obvious overcontrolled BP or DM.  On clinical grounds dizziness is peripheral, however he has multiple CV RF. Also question of Romberg being positive. Plan: Brain MRI, further advised with results, see AVS HTN: Check BMP and CBC.  In the ambulatory settings BPs are in the 140s. CAD: No chest pain, palpitations or edema. B12 deficiency: Checking labs RTC 6 weeks

## 2021-10-21 NOTE — Progress Notes (Signed)
Subjective:    Patient ID: Randy Castro., male    DOB: 1951-04-20, 71 y.o.   MRN: [id number]  DOS:  10/21/2021 Type of visit - description: Acute  Main complaint is lightheadedness. Symptoms are going on for about a year. Worse in the last 6 weeks Described as feeling lightheaded every time he is stands up or turns his head too fast. Symptoms are short-lived. States that his vision "goes gray" w/ llightheadedness.but denies LOC or falls. His balance in general is poor and he is using a cane to help him steady his gait. His BP and CBGs have not been low at any time.   Review of Systems Denies sinus pain although he has some sinus congestion Occasional tinnitus, not persistent. No decreased hearing No chest pain, edema or palpitations. No nausea vomiting.  No blood in the stools. No headache. No diplopia, slurred speech, face numbness or motor deficits  Past Medical History:  Diagnosis Date   Anxiety    Arthritis    BCC (basal cell carcinoma of skin) 12/2020   CAD (coronary artery disease)    a. NSTEMI 12/12: EF 40-45%. VCB:SWHQPRF balloon PTCA + Promus DES x 1 to mid LAD;  b. 11/2012: Cath with Cutting balloon PTCA  LAD for ISR to LAD, EF 55%;  c. 12/2012 Cath/PCI: LAD stents patent, 80 ost Diag (jailed)->PTCA, LCX/RCA patent.   Essential hypertension    GI bleed    History of blood transfusion    was getting 4 units   Hyperlipidemia    Ischemic cardiomyopathy    a.  echo 08/06/11: dist ant wall, apical and septal and infero-apical HK, mild LVH, EF 40%, mild LAE, PASP 34, asc aorta mildly dilated, mild TR;  b.  Echo (3/13) showed recovery of LV systolic function with EF 16-38%, grade I diastolic dysfunction, mild MR.    Myocardial infarction [facility])    twice with NSTEMI 2012   Palpitations    Type 2 diabetes mellitus [facility])     Past Surgical History:  Procedure Laterality Date   ANTERIOR CERVICAL DECOMP/DISCECTOMY FUSION  in the 90s   CHOLECYSTECTOMY  03/27/2019    COLONOSCOPY  2021   COLONOSCOPY WITH ESOPHAGOGASTRODUODENOSCOPY (EGD)  10/2019   CORONARY ANGIOPLASTY  12/15/2012; 12/22/2012   CORONARY ANGIOPLASTY WITH STENT PLACEMENT  07/2011   "1" (12/22/2012)   GASTRIC BYPASS  12/21/2017   KNEE ARTHROSCOPY Left 12/31/2016   LEFT HEART CATHETERIZATION WITH CORONARY ANGIOGRAM N/A 08/06/2011   Procedure: LEFT HEART CATHETERIZATION WITH CORONARY ANGIOGRAM;  Surgeon: Burnell Blanks, MD;  Location: [facility] CATH LAB;  Service: Cardiovascular;  Laterality: N/A;   LEFT HEART CATHETERIZATION WITH CORONARY ANGIOGRAM N/A 12/15/2012   Procedure: LEFT HEART CATHETERIZATION WITH CORONARY ANGIOGRAM;  Surgeon: Sherren Mocha, MD;  Location: [facility] CATH LAB;  Service: Cardiovascular;  Laterality: N/A;   LEFT HEART CATHETERIZATION WITH CORONARY ANGIOGRAM N/A 12/22/2012   Procedure: LEFT HEART CATHETERIZATION WITH CORONARY ANGIOGRAM;  Surgeon: Sherren Mocha, MD;  Location: [facility] CATH LAB;  Service: Cardiovascular;  Laterality: N/A;   PERCUTANEOUS CORONARY STENT INTERVENTION (PCI-S) Right 12/15/2012   Procedure: PERCUTANEOUS CORONARY STENT INTERVENTION (PCI-S);  Surgeon: Sherren Mocha, MD;  Location: [facility] CATH LAB;  Service: Cardiovascular;  Laterality: Right;    Current Outpatient Medications  Medication Instructions   aspirin EC 81 mg, Oral, Daily   atorvastatin (LIPITOR) 80 MG tablet TAKE ONE TABLET BY MOUTH EVERYDAY AT BEDTIME   Blood Glucose Monitoring Suppl (ONE TOUCH ULTRA 2) w/Device KIT Does not apply  carvedilol (COREG) 12.5 MG tablet TAKE ONE TABLET BY MOUTH EVERY MORNING and TAKE ONE TABLET BY MOUTH EVERYDAY AT BEDTIME   clotrimazole-betamethasone (LOTRISONE) cream Topical, 2 times daily PRN   Continuous Blood Gluc Sensor (FREESTYLE LIBRE 3 SENSOR) MISC 1 each, Does not apply, Every 14 days, Place 1 sensor on the skin every 14 days. Use to check glucose continuously. Diagnosis E:11.65, Z79.4   FARXIGA 5 MG TABS tablet TAKE ONE TABLET BY MOUTH ONCE DAILY BEFORE BREAKFAST    FEROSUL 325 (65 Fe) MG tablet TAKE 1 TABLET BY MOUTH DAILY   FLUoxetine (PROZAC) 40 mg, Oral, Daily   glucose blood (ONETOUCH ULTRA) test strip Check blood sugar 2-3 times daily.  Dx Code: E11.9   losartan (COZAAR) 50 MG tablet TAKE ONE TABLET BY MOUTH EVERYDAY AT BEDTIME   magnesium oxide (MAG-OX) 400 MG tablet TAKE ONE TABLET BY MOUTH EVERY MORNING and TAKE ONE TABLET BY MOUTH EVERYDAY AT BEDTIME   metFORMIN (GLUCOPHAGE) 1000 MG tablet TAKE ONE TABLET BY MOUTH BY MOUTH EVERY MORNING and TAKE ONE TABLET BY MOUTH EVERYDAY AT BEDTIME   nitroGLYCERIN (NITROSTAT) 0.4 mg, Sublingual, Every 5 min PRN   NOVOLIN 70/30 KWIKPEN (70-30) 100 UNIT/ML KwikPen INJECT 18 UNITS into THE SKIN TWICE DAILY WITH A MEAL   pantoprazole (PROTONIX) 40 MG tablet TAKE ONE TABLET BY MOUTH EVERY MORNING and TAKE ONE TABLET BY MOUTH EVERYDAY AT BEDTIME   Potassium 99 MG TABS 1 tablet, Oral, Daily   primidone (MYSOLINE) 50 MG tablet TAKE THREE TABLETS BY MOUTH EVERY MORNING and TAKE TWO TABLETS BY MOUTH EVERYDAY AT BEDTIME   vitamin B-12 (CYANOCOBALAMIN) 1,000 mcg, Oral, Daily       Objective:   Physical Exam BP (!) 144/74 (BP Location: Left Arm, Patient Position: Sitting, Cuff Size: Small)    Pulse 62    Temp 98 F (36.7 C) (Oral)    Resp 16    Ht _0  (1.88 m)    Wt 214 lb 4 oz (97.2 kg)    SpO2 98%    BMI 27.51 kg/m  General:   Well developed, NAD, BMI noted. HEENT:  Normocephalic . Face symmetric, atraumatic Neck: Normal carotid pulses Lungs:  CTA B Normal respiratory effort, no intercostal retractions, no accessory muscle use. Heart: RRR,  no murmur.  Lower extremities: no pretibial edema bilaterally  Skin: Not pale. Not jaundice Neurologic:  alert & oriented X3.  Speech normal, gait appropriate for age.  Today he did not use a cane EOMI without nystagmus. Pupils equal and reactive. Motor symmetric. + Romberg? Psych--  Cognition and judgment appear intact.  Cooperative with normal attention span and  concentration.  Behavior appropriate. No anxious or depressed appearing.      Assessment     Assessment   DM : insulin dependent, uncontrolled, w/ CAD CHF. Intol Trulicity, used to see [facility] until 08/2019 Neuropathy, severe, documented by [facility] 2022 HTN Hyperlipidemia CAD, CHF, ischemic cardiomyopathy, PVCs (Holter 2019 show 13.6% burden) NSTEMI in 07/2011; unstable angina in 11/2012 and 12/2012 .  OSA dx ~08-2017, on Cpap Anxiety-depression Essential Tremor dx 2019  Skin cancer: Sees dermatology Morbid obesity:gastric sleeve, 11-2017 Dr Raul Del 09-2019 GI bleed, gastric ulcer, required transfusion B12 deficiency Dx 2022   PLAN Dizziness: As described above, worse for several weeks, orthostatic vital signs negative.  No obvious overcontrolled BP or DM.  On clinical grounds dizziness is peripheral, however he has multiple CV RF. Also question of Romberg being positive. Plan: Brain MRI, further advised  with results, see AVS HTN: Check BMP and CBC.  In the ambulatory settings BPs are in the 140s. CAD: No chest pain, palpitations or edema. B12 deficiency: Checking labs RTC 6 weeks    This visit occurred during the SARS-CoV-2 public health emergency.  Safety protocols were in place, including screening questions prior to the visit, additional usage of staff PPE, and extensive cleaning of exam room while observing appropriate contact time as indicated for disinfecting solutions.

## 2021-11-05 ENCOUNTER — Other Ambulatory Visit: Payer: Self-pay

## 2021-11-05 ENCOUNTER — Ambulatory Visit (HOSPITAL_COMMUNITY)
Admission: RE | Admit: 2021-11-05 | Discharge: 2021-11-05 | Disposition: A | Discharge status: Home / Self Care | Payer: PPO | Source: Ambulatory Visit | Attending: Internal Medicine | Admitting: Internal Medicine

## 2021-11-05 DIAGNOSIS — R42 Dizziness and giddiness: Secondary | ICD-10-CM | POA: Insufficient documentation

## 2021-11-11 DIAGNOSIS — H8111 Benign paroxysmal vertigo, right ear: Secondary | ICD-10-CM | POA: Diagnosis not present

## 2021-11-11 DIAGNOSIS — H8112 Benign paroxysmal vertigo, left ear: Secondary | ICD-10-CM | POA: Diagnosis not present

## 2021-11-11 DIAGNOSIS — R262 Difficulty in walking, not elsewhere classified: Secondary | ICD-10-CM | POA: Diagnosis not present

## 2021-11-12 ENCOUNTER — Other Ambulatory Visit: Payer: Self-pay | Admitting: Internal Medicine

## 2021-11-13 DIAGNOSIS — H8111 Benign paroxysmal vertigo, right ear: Secondary | ICD-10-CM | POA: Diagnosis not present

## 2021-11-13 DIAGNOSIS — H8112 Benign paroxysmal vertigo, left ear: Secondary | ICD-10-CM | POA: Diagnosis not present

## 2021-11-13 DIAGNOSIS — R262 Difficulty in walking, not elsewhere classified: Secondary | ICD-10-CM | POA: Diagnosis not present

## 2021-11-14 ENCOUNTER — Telehealth: Payer: Self-pay

## 2021-11-14 NOTE — Telephone Encounter (Signed)
Plan of care signed and faxed to [facility] at [phone]. Form sent for scanning. ?

## 2021-11-17 DIAGNOSIS — H8112 Benign paroxysmal vertigo, left ear: Secondary | ICD-10-CM | POA: Diagnosis not present

## 2021-11-17 DIAGNOSIS — H8111 Benign paroxysmal vertigo, right ear: Secondary | ICD-10-CM | POA: Diagnosis not present

## 2021-11-17 DIAGNOSIS — R262 Difficulty in walking, not elsewhere classified: Secondary | ICD-10-CM | POA: Diagnosis not present

## 2021-11-18 ENCOUNTER — Ambulatory Visit: Payer: PPO | Admitting: Internal Medicine

## 2021-11-18 ENCOUNTER — Other Ambulatory Visit: Payer: Self-pay

## 2021-11-18 ENCOUNTER — Encounter: Payer: Self-pay | Admitting: Internal Medicine

## 2021-11-18 VITALS — BP 128/74 | HR 63 | Ht 74.0 in | Wt 218.0 lb

## 2021-11-18 DIAGNOSIS — E1159 Type 2 diabetes mellitus with other circulatory complications: Secondary | ICD-10-CM | POA: Diagnosis not present

## 2021-11-18 DIAGNOSIS — E1165 Type 2 diabetes mellitus with hyperglycemia: Secondary | ICD-10-CM | POA: Insufficient documentation

## 2021-11-18 DIAGNOSIS — Z794 Long term (current) use of insulin: Secondary | ICD-10-CM

## 2021-11-18 DIAGNOSIS — E1142 Type 2 diabetes mellitus with diabetic polyneuropathy: Secondary | ICD-10-CM | POA: Diagnosis not present

## 2021-11-18 DIAGNOSIS — R739 Hyperglycemia, unspecified: Secondary | ICD-10-CM

## 2021-11-18 LAB — POCT GLYCOSYLATED HEMOGLOBIN (HGB A1C): Hemoglobin A1C: 7.8 % — AB (ref 4.0–5.6)

## 2021-11-18 LAB — POCT GLUCOSE (DEVICE FOR HOME USE): Glucose Fasting, POC: 123 mg/dL — AB (ref 70–99)

## 2021-11-18 MED ORDER — METFORMIN HCL 1000 MG PO TABS: 1000.0000 mg | ORAL_TABLET | Freq: Two times a day (BID) | ORAL | 3 refills | Status: DC

## 2021-11-18 MED ORDER — FREESTYLE LIBRE 2 SENSOR MISC: 1.0000 | 3 refills | Status: DC

## 2021-11-18 MED ORDER — LANTUS SOLOSTAR 100 UNIT/ML ~~LOC~~ SOPN
20.0000 [IU] | PEN_INJECTOR | Freq: Every day | SUBCUTANEOUS | 3 refills | Status: DC
Start: 2021-11-18 — End: 2022-05-27

## 2021-11-18 MED ORDER — DAPAGLIFLOZIN PROPANEDIOL 5 MG PO TABS: 5.0000 mg | ORAL_TABLET | Freq: Every day | ORAL | 3 refills | Status: DC

## 2021-11-18 NOTE — Progress Notes (Signed)
?   ?Name: Randy Castro.  ?Age/ Sex: 71 y.o., male   ?MRN/ DOB: [id number], 04-24-1951    ? ?PCP: Colon Branch, MD   ?Reason for Endocrinology Evaluation: Type 2 Diabetes Mellitus  ?Initial Endocrine Consultative Visit: 04/30/2020  ? ? ?PATIENT IDENTIFIER: [name]. is a 71 y.o. male with a past medical history of T2DM, CAD, Dyslipidemia and HTN. The patient has followed with Endocrinology clinic since 04/30/2020 for consultative assistance with management of his diabetes. ? ?DIABETIC HISTORY:  ?Mr. Napoli was diagnosed with DM in 1998 , he has been on Janumet,  Glimepiride and Trulicity with reported N/V with Trulicity.Was put on insulin in 2012 following admission for NSTEMI . His hemoglobin A1c has ranged from 7.3% in 2013, peaking at 13.4% in 2019 ? ? ?Pt was established with Dr. Cruzita Lederer from 2013 to 2014 ? ? ?On his initial visit to our clinic his A1c was 9.7 % . We continued Metformin and changed NPH insulin to insulin Mix  ? ? ?Was unable to get the dexcom 06/2021 ?Wilder Glade started 06/2021 ?SUBJECTIVE:  ? ?During the last visit (07/15/2021): A1c 9.1% . We increased insulin mix and continued metformin started [place] ? ? ? ? ?Today (11/18/2021): [name] is here for a follow up on diabetes.  He checks his blood sugars occasionally   . The patient has had hypoglycemic episodes since the last clinic visit, he did not bring his meter today.   ? ? ?Was seen by cardiology 05/12/2021 ?Saw Dr. Carles Collet ( neurology )04/2021 for essential tremors ?Had vertigo for 3 years, resolved recently through PT  ? ? ?Denies nausea, vomiting and diarrhea  ? ? ? ?HOME DIABETES REGIMEN:  ?Metformin 1000 mg BID  ?Novolin Mix 20 units before breakfast and 18 units before supper - takes 18 units BID  ?Farxiga 5 mg daily ? ? ? ?Statin: yes ?ACE-I/ARB: yes ? ? ? ?METER DOWNLOAD SUMMARY: Did not bring  ? ? ? ? ?DIABETIC COMPLICATIONS: ?Microvascular complications:  ?Neuropathy ?Denies: CKD, retinopathy ?Last Eye Exam: Completed  08/2021 ? ?Macrovascular complications:  ?CAD (S/P PCI )  ?Denies: CVA, PVD ? ? ?HISTORY:  ?Past Medical History:  ?Past Medical History:  ?Diagnosis Date  ? Anxiety   ? Arthritis   ? BCC (basal cell carcinoma of skin) 12/2020  ? CAD (coronary artery disease)   ? a. NSTEMI 12/12: EF 40-45%. DJS:HFWYOVZ balloon PTCA + Promus DES x 1 to mid LAD;  b. 11/2012: Cath with Cutting balloon PTCA  LAD for ISR to LAD, EF 55%;  c. 12/2012 Cath/PCI: LAD stents patent, 80 ost Diag (jailed)->PTCA, LCX/RCA patent.  ? Essential hypertension   ? GI bleed   ? History of blood transfusion   ? was getting 4 units  ? Hyperlipidemia   ? Ischemic cardiomyopathy   ? a.  echo 08/06/11: dist ant wall, apical and septal and infero-apical HK, mild LVH, EF 40%, mild LAE, PASP 34, asc aorta mildly dilated, mild TR;  b.  Echo (3/13) showed recovery of LV systolic function with EF 85-88%, grade I diastolic dysfunction, mild MR.   ? Myocardial infarction [facility])   ? twice with NSTEMI 2012  ? Palpitations   ? Type 2 diabetes mellitus ([place])   ? ?Past Surgical History:  ?Past Surgical History:  ?Procedure Laterality Date  ? ANTERIOR CERVICAL DECOMP/DISCECTOMY FUSION  in the 90s  ? CHOLECYSTECTOMY  03/27/2019  ? COLONOSCOPY  2021  ? COLONOSCOPY WITH ESOPHAGOGASTRODUODENOSCOPY (EGD)  10/2019  ? CORONARY ANGIOPLASTY  12/15/2012; 12/22/2012  ? CORONARY ANGIOPLASTY WITH STENT PLACEMENT  07/2011  ? "1" (12/22/2012)  ? GASTRIC BYPASS  12/21/2017  ? KNEE ARTHROSCOPY Left 12/31/2016  ? LEFT HEART CATHETERIZATION WITH CORONARY ANGIOGRAM N/A 08/06/2011  ? Procedure: LEFT HEART CATHETERIZATION WITH CORONARY ANGIOGRAM;  Surgeon: Burnell Blanks, MD;  Location: [facility] CATH LAB;  Service: Cardiovascular;  Laterality: N/A;  ? LEFT HEART CATHETERIZATION WITH CORONARY ANGIOGRAM N/A 12/15/2012  ? Procedure: LEFT HEART CATHETERIZATION WITH CORONARY ANGIOGRAM;  Surgeon: Sherren Mocha, MD;  Location: [facility] CATH LAB;  Service: Cardiovascular;  Laterality: N/A;  ? LEFT HEART  CATHETERIZATION WITH CORONARY ANGIOGRAM N/A 12/22/2012  ? Procedure: LEFT HEART CATHETERIZATION WITH CORONARY ANGIOGRAM;  Surgeon: Sherren Mocha, MD;  Location: [facility] CATH LAB;  Service: Cardiovascular;  Laterality: N/A;  ? PERCUTANEOUS CORONARY STENT INTERVENTION (PCI-S) Right 12/15/2012  ? Procedure: PERCUTANEOUS CORONARY STENT INTERVENTION (PCI-S);  Surgeon: Sherren Mocha, MD;  Location: [facility] CATH LAB;  Service: Cardiovascular;  Laterality: Right;  ? ?Social History:  reports that he quit smoking about 29 years ago. His smoking use included cigarettes. He has a 25.00 pack-year smoking history. He has never used smokeless tobacco. He reports current alcohol use. He reports that he does not use drugs. ?Family History:  ?Family History  ?Problem Relation Age of Onset  ? Diabetes Brother   ? Lung cancer Brother   ? Diabetes Mother   ? Coronary artery disease Father   ? Heart attack Father 75  ? Diabetes Sister   ? Coronary artery disease Sister   ? Colon cancer Neg Hx   ? Prostate cancer Neg Hx   ? Stroke Neg Hx   ? Esophageal cancer Neg Hx   ? Colon polyps Neg Hx   ? Rectal cancer Neg Hx   ? Stomach cancer Neg Hx   ? ? ? ?HOME MEDICATIONS: ?Allergies as of 11/18/2021   ? ?   Reactions  ? Trulicity [dulaglutide] Nausea And Vomiting  ? ?  ? ?  ?Medication List  ?  ? ?  ? Accurate as of November 18, 2021  9:46 AM. If you have any questions, ask your nurse or doctor.  ?  ?  ? ?  ? ?aspirin EC 81 MG tablet ?Take 81 mg by mouth daily. ?  ?atorvastatin 80 MG tablet ?Commonly known as: LIPITOR ?TAKE ONE TABLET BY MOUTH EVERYDAY AT BEDTIME ?  ?carvedilol 12.5 MG tablet ?Commonly known as: COREG ?TAKE ONE TABLET BY MOUTH EVERY MORNING and TAKE ONE TABLET BY MOUTH EVERYDAY AT BEDTIME ?  ?clotrimazole-betamethasone cream ?Commonly known as: LOTRISONE ?Apply topically 2 (two) times daily as needed. ?  ?Farxiga 5 MG Tabs tablet ?Generic drug: dapagliflozin propanediol ?TAKE ONE TABLET BY MOUTH ONCE DAILY BEFORE BREAKFAST ?  ?FeroSul 325  (65 FE) MG tablet ?Generic drug: ferrous sulfate ?TAKE 1 TABLET BY MOUTH DAILY ?  ?FLUoxetine 40 MG capsule ?Commonly known as: PROZAC ?Take 1 capsule (40 mg total) by mouth daily. ?  ?FreeStyle Libre 3 Sensor Misc ?1 each by Does not apply route every 14 (fourteen) days. Place 1 sensor on the skin every 14 days. Use to check glucose continuously. Diagnosis E:11.65, Z79.4 ?  ?losartan 50 MG tablet ?Commonly known as: COZAAR ?TAKE ONE TABLET BY MOUTH EVERYDAY AT BEDTIME ?  ?magnesium oxide 400 MG tablet ?Commonly known as: MAG-OX ?TAKE ONE TABLET BY MOUTH EVERY MORNING and TAKE ONE TABLET BY MOUTH EVERYDAY AT BEDTIME ?  ?metFORMIN 1000 MG  tablet ?Commonly known as: GLUCOPHAGE ?TAKE ONE TABLET BY MOUTH BY MOUTH EVERY MORNING and TAKE ONE TABLET BY MOUTH EVERYDAY AT BEDTIME ?  ?nitroGLYCERIN 0.4 MG SL tablet ?Commonly known as: NITROSTAT ?Place 1 tablet (0.4 mg total) under the tongue every 5 (five) minutes as needed for chest pain. ?  ?NovoLIN 70/30 Kwikpen (70-30) 100 UNIT/ML KwikPen ?Generic drug: insulin isophane & regular human KwikPen ?INJECT 18 UNITS into THE SKIN TWICE DAILY WITH A MEAL ?What changed: See the new instructions. ?  ?ONE TOUCH ULTRA 2 w/Device Kit ?by Does not apply route. ?  ?OneTouch Ultra test strip ?Generic drug: glucose blood ?Check blood sugar 2-3 times daily.  Dx Code: E11.9 ?  ?pantoprazole 40 MG tablet ?Commonly known as: PROTONIX ?TAKE ONE TABLET BY MOUTH EVERY MORNING and TAKE ONE TABLET BY MOUTH EVERYDAY AT BEDTIME ?  ?Potassium 99 MG Tabs ?Take 1 tablet by mouth daily. ?  ?primidone 50 MG tablet ?Commonly known as: MYSOLINE ?TAKE THREE TABLETS BY MOUTH EVERY MORNING and TAKE TWO TABLETS BY MOUTH EVERYDAY AT BEDTIME ?  ?vitamin B-12 1000 MCG tablet ?Commonly known as: CYANOCOBALAMIN ?Take 1,000 mcg by mouth daily. ?  ? ?  ? ? ? ?OBJECTIVE:  ? ?Vital Signs: BP 128/74 (BP Location: Left Arm, Patient Position: Sitting, Cuff Size: Small)   Pulse 63   Ht 6' 2" (1.88 m)   Wt 218 lb (98.9  kg)   SpO2 98%   BMI 27.99 kg/m?   ?Wt Readings from Last 3 Encounters:  ?11/18/21 218 lb (98.9 kg)  ?10/21/21 214 lb 4 oz (97.2 kg)  ?07/23/21 219 lb 2 oz (99.4 kg)  ? ? ? ?Exam: ?General: Pt appears well and is in NAD  ?Laurena Spies

## 2021-11-18 NOTE — Patient Instructions (Signed)
-   Stop Novolin mix  ?- Start Lantus 20 units ONCE daily  ?- Continue Metformin 1000 mg, 1 tablet with Breakfast and Supper ?- Continue Farxiga 5 mg, 1 tablet every morning  ? ? ? ? ? ?HOW TO TREAT LOW BLOOD SUGARS (Blood sugar LESS THAN 70 MG/DL) ?Please follow the RULE OF 15 for the treatment of hypoglycemia treatment (when your (blood sugars are less than 70 mg/dL)  ? ?STEP 1: Take 15 grams of carbohydrates when your blood sugar is low, which includes:  ?3-4 GLUCOSE TABS  OR ?3-4 OZ OF JUICE OR REGULAR SODA OR ?ONE TUBE OF GLUCOSE GEL   ? ?STEP 2: RECHECK blood sugar in 15 MINUTES ?STEP 3: If your blood sugar is still low at the 15 minute recheck --> then, go back to STEP 1 and treat AGAIN with another 15 grams of carbohydrates. ? ?

## 2021-11-19 NOTE — Progress Notes (Signed)
? ? ?Cardiology Office Note ? ?Date: 11/20/2021  ? ?ID: Randy Castro., DOB 11-13-1950, MRN [id number] ? ?PCP:  Colon Branch, MD  ?Cardiologist:  Rozann Lesches, MD ?Electrophysiologist:  None  ? ?Chief Complaint  ?Patient presents with  ? Cardiac follow-up  ? ? ?History of Present Illness: ?Randy Castro. is a 71 y.o. male last seen in September 2022.  He is here for a routine visit.  Reports no active angina symptoms nitroglycerin use, currently NYHA class II dyspnea.  Still enjoys working in his [employer], also outdoor chores in his garden. ? ?I reviewed his medications which are noted below.  He continues to follow with Dr. Larose Kells on a regular basis.  I went over his most recent lab work which is noted below. ? ?I personally viewed his ECG today which shows sinus rhythm with second-degree type I block and IVCD.  He reports no sudden dizziness or syncope. ? ?His last echocardiogram was in 2021 at which point LVEF was 50 to 55%.  We discussed getting an updated study. ? ?Past Medical History:  ?Diagnosis Date  ? Anxiety   ? Arthritis   ? BCC (basal cell carcinoma of skin) 12/2020  ? CAD (coronary artery disease)   ? a. NSTEMI 12/12: EF 40-45%. [id number]:[id number] balloon PTCA + Promus DES x 1 to mid LAD;  b. 11/2012: Cath with Cutting balloon PTCA  LAD for ISR to LAD, EF 55%;  c. 12/2012 Cath/PCI: LAD stents patent, 80 ost Diag (jailed)->PTCA, LCX/RCA patent.  ? Essential hypertension   ? GI bleed   ? History of blood transfusion   ? was getting 4 units  ? Hyperlipidemia   ? Ischemic cardiomyopathy   ? a.  echo 08/06/11: dist ant wall, apical and septal and infero-apical HK, mild LVH, EF 40%, mild LAE, PASP 34, asc aorta mildly dilated, mild TR;  b.  Echo (3/13) showed recovery of LV systolic function with EF 69-79%, grade I diastolic dysfunction, mild MR.   ? Myocardial infarction [facility])   ? twice with NSTEMI 2012  ? Palpitations   ? Type 2 diabetes mellitus ([place])   ? ? ?Past Surgical History:  ?Procedure Laterality Date   ? ANTERIOR CERVICAL DECOMP/DISCECTOMY FUSION  in the 90s  ? CHOLECYSTECTOMY  03/27/2019  ? COLONOSCOPY  2021  ? COLONOSCOPY WITH ESOPHAGOGASTRODUODENOSCOPY (EGD)  10/2019  ? CORONARY ANGIOPLASTY  12/15/2012; 12/22/2012  ? CORONARY ANGIOPLASTY WITH STENT PLACEMENT  07/2011  ? "1" (12/22/2012)  ? GASTRIC BYPASS  12/21/2017  ? KNEE ARTHROSCOPY Left 12/31/2016  ? LEFT HEART CATHETERIZATION WITH CORONARY ANGIOGRAM N/A 08/06/2011  ? Procedure: LEFT HEART CATHETERIZATION WITH CORONARY ANGIOGRAM;  Surgeon: Burnell Blanks, MD;  Location: [facility] CATH LAB;  Service: Cardiovascular;  Laterality: N/A;  ? LEFT HEART CATHETERIZATION WITH CORONARY ANGIOGRAM N/A 12/15/2012  ? Procedure: LEFT HEART CATHETERIZATION WITH CORONARY ANGIOGRAM;  Surgeon: Sherren Mocha, MD;  Location: [facility] CATH LAB;  Service: Cardiovascular;  Laterality: N/A;  ? LEFT HEART CATHETERIZATION WITH CORONARY ANGIOGRAM N/A 12/22/2012  ? Procedure: LEFT HEART CATHETERIZATION WITH CORONARY ANGIOGRAM;  Surgeon: Sherren Mocha, MD;  Location: [facility] CATH LAB;  Service: Cardiovascular;  Laterality: N/A;  ? PERCUTANEOUS CORONARY STENT INTERVENTION (PCI-S) Right 12/15/2012  ? Procedure: PERCUTANEOUS CORONARY STENT INTERVENTION (PCI-S);  Surgeon: Sherren Mocha, MD;  Location: [facility] CATH LAB;  Service: Cardiovascular;  Laterality: Right;  ? ? ?Current Outpatient Medications  ?Medication Sig Dispense Refill  ? aspirin EC 81 MG tablet Take 81 mg  by mouth daily.    ? atorvastatin (LIPITOR) 80 MG tablet TAKE ONE TABLET BY MOUTH EVERYDAY AT BEDTIME 90 tablet 1  ? Blood Glucose Monitoring Suppl (ONE TOUCH ULTRA 2) w/Device KIT by Does not apply route.    ? carvedilol (COREG) 12.5 MG tablet TAKE ONE TABLET BY MOUTH EVERY MORNING and TAKE ONE TABLET BY MOUTH EVERYDAY AT BEDTIME 180 tablet 1  ? Continuous Blood Gluc Sensor (FREESTYLE LIBRE 2 SENSOR) MISC 1 Device by Does not apply route every 14 (fourteen) days. 6 each 3  ? dapagliflozin propanediol (FARXIGA) 5 MG TABS tablet Take 1 tablet (5  mg total) by mouth daily. 90 tablet 3  ? FEROSUL 325 (65 Fe) MG tablet TAKE 1 TABLET BY MOUTH DAILY 30 tablet 3  ? FLUoxetine (PROZAC) 40 MG capsule Take 1 capsule (40 mg total) by mouth daily. 30 capsule 10  ? glucose blood (ONETOUCH ULTRA) test strip Check blood sugar 2-3 times daily.  Dx Code: E11.9 300 each 12  ? insulin glargine (LANTUS SOLOSTAR) 100 UNIT/ML Solostar Pen Inject 20 Units into the skin daily. 30 mL 3  ? losartan (COZAAR) 50 MG tablet TAKE ONE TABLET BY MOUTH EVERYDAY AT BEDTIME 90 tablet 1  ? magnesium oxide (MAG-OX) 400 MG tablet TAKE ONE TABLET BY MOUTH EVERY MORNING and TAKE ONE TABLET BY MOUTH EVERYDAY AT BEDTIME 180 tablet 1  ? metFORMIN (GLUCOPHAGE) 1000 MG tablet Take 1 tablet (1,000 mg total) by mouth 2 (two) times daily with a meal. 180 tablet 3  ? nitroGLYCERIN (NITROSTAT) 0.4 MG SL tablet Place 1 tablet (0.4 mg total) under the tongue every 5 (five) minutes as needed for chest pain. 100 tablet 3  ? pantoprazole (PROTONIX) 40 MG tablet TAKE ONE TABLET BY MOUTH EVERY MORNING and TAKE ONE TABLET BY MOUTH EVERYDAY AT BEDTIME 180 tablet 1  ? Potassium 99 MG TABS Take 1 tablet by mouth daily.    ? primidone (MYSOLINE) 50 MG tablet TAKE THREE TABLETS BY MOUTH EVERY MORNING and TAKE TWO TABLETS BY MOUTH EVERYDAY AT BEDTIME 450 tablet 1  ? vitamin B-12 (CYANOCOBALAMIN) 1000 MCG tablet Take 1,000 mcg by mouth daily.    ? ?No current facility-administered medications for this visit.  ? ?Allergies:  Trulicity [dulaglutide]  ? ?ROS: No syncope, no orthopnea or PND. ? ?Physical Exam: ?VS:  BP (!) 132/58   Pulse 76   Ht 6' 2"  (1.88 m)   Wt 219 lb 3.2 oz (99.4 kg)   SpO2 97%   BMI 28.14 kg/m? , BMI Body mass index is 28.14 kg/m?. ? ?Wt Readings from Last 3 Encounters:  ?11/20/21 219 lb 3.2 oz (99.4 kg)  ?11/18/21 218 lb (98.9 kg)  ?10/21/21 214 lb 4 oz (97.2 kg)  ?  ?General: Patient appears comfortable at rest. ?HEENT: Conjunctiva and lids normal. ?Neck: Supple, no elevated JVP or carotid  bruits, no thyromegaly. ?Lungs: Clear to auscultation, nonlabored breathing at rest. ?Cardiac: Regular rate and rhythm, no S3, 1/6 systolic murmur, no pericardial rub. ?Extremities: No pitting edema. ? ?ECG:  An ECG dated 11/06/2020 was personally reviewed today and demonstrated:  Sinus rhythm with LVH and IVCD. ? ?Recent Labwork: ?04/17/2021: TSH 1.11 ?07/23/2021: ALT 37; AST 26 ?10/21/2021: BUN 11; Creatinine, Ser 0.80; Hemoglobin 12.4; Platelets 258.0; Potassium 4.5; Sodium 143  ?   ?Component Value Date/Time  ? CHOL 110 04/17/2021 0919  ? TRIG 102.0 04/17/2021 0919  ? HDL 43.80 04/17/2021 0919  ? CHOLHDL 3 04/17/2021 0919  ? VLDL  20.4 04/17/2021 0919  ? LDLCALC 46 04/17/2021 0919  ? LDLDIRECT 145.0 04/26/2018 1026  ? ? ?Other Studies Reviewed Today: ? ?Echocardiogram 03/21/2020: ? 1. Left ventricular ejection fraction, by estimation, is 50 to 55%. The  ?left ventricle has low normal function. The left ventricle has no regional  ?wall motion abnormalities. Left ventricular diastolic parameters are  ?consistent with Grade I diastolic  ?dysfunction (impaired relaxation).  ? 2. Right ventricular systolic function is normal. The right ventricular  ?size is normal. There is normal pulmonary artery systolic pressure.  ? 3. Left atrial size was severely dilated.  ? 4. The mitral valve is normal in structure. Mild mitral valve  ?regurgitation. No evidence of mitral stenosis.  ? 5. The aortic valve is tricuspid. Aortic valve regurgitation is not  ?visualized. No aortic stenosis is present.  ? 6. Aortic dilatation noted. There is mild dilatation of the aortic root  ?measuring 41 mm.  ? 7. The inferior vena cava is normal in size with greater than 50%  ?respiratory variability, suggesting right atrial pressure of 3 mmHg.  ? 8. Probable small PFO with left to right shunt.  ? ?Assessment and Plan: ? ?1.  HFrecEF with history of ischemic cardiomyopathy and improvement in LVEF, most recently 50 to 55% as of 2021.  He reports NYHA  class II dyspnea on medical therapy.  We will obtain a follow-up echocardiogram for reevaluation.  Continue Coreg, Cozaar, and [place]. ? ?2.  CAD status post PCI of the LAD and more recently Cutting Balloon angi

## 2021-11-20 ENCOUNTER — Ambulatory Visit: Payer: PPO | Admitting: Cardiology

## 2021-11-20 ENCOUNTER — Encounter: Payer: Self-pay | Admitting: Cardiology

## 2021-11-20 VITALS — BP 132/58 | HR 76 | Ht 74.0 in | Wt 219.2 lb

## 2021-11-20 DIAGNOSIS — E782 Mixed hyperlipidemia: Secondary | ICD-10-CM

## 2021-11-20 DIAGNOSIS — I25119 Atherosclerotic heart disease of native coronary artery with unspecified angina pectoris: Secondary | ICD-10-CM

## 2021-11-20 DIAGNOSIS — Z8679 Personal history of other diseases of the circulatory system: Secondary | ICD-10-CM | POA: Diagnosis not present

## 2021-11-20 NOTE — Patient Instructions (Addendum)

## 2021-11-24 DIAGNOSIS — H8112 Benign paroxysmal vertigo, left ear: Secondary | ICD-10-CM | POA: Diagnosis not present

## 2021-11-24 DIAGNOSIS — H8111 Benign paroxysmal vertigo, right ear: Secondary | ICD-10-CM | POA: Diagnosis not present

## 2021-11-24 DIAGNOSIS — R262 Difficulty in walking, not elsewhere classified: Secondary | ICD-10-CM | POA: Diagnosis not present

## 2021-11-25 ENCOUNTER — Ambulatory Visit: Payer: PPO | Admitting: Internal Medicine

## 2021-11-26 ENCOUNTER — Ambulatory Visit (INDEPENDENT_AMBULATORY_CARE_PROVIDER_SITE_OTHER): Payer: PPO

## 2021-11-26 DIAGNOSIS — I25119 Atherosclerotic heart disease of native coronary artery with unspecified angina pectoris: Secondary | ICD-10-CM

## 2021-11-26 DIAGNOSIS — E1165 Type 2 diabetes mellitus with hyperglycemia: Secondary | ICD-10-CM | POA: Diagnosis not present

## 2021-11-26 LAB — ECHOCARDIOGRAM COMPLETE
Area-P 1/2: 2.02 cm2
Calc EF: 44.8 %
MV M vel: 4.77 m/s
MV Peak grad: 91 mmHg
S' Lateral: 4.01 cm
Single Plane A2C EF: 44.9 %
Single Plane A4C EF: 46.2 %

## 2021-11-27 ENCOUNTER — Ambulatory Visit (INDEPENDENT_AMBULATORY_CARE_PROVIDER_SITE_OTHER): Payer: PPO | Admitting: Internal Medicine

## 2021-11-27 ENCOUNTER — Encounter: Payer: Self-pay | Admitting: Internal Medicine

## 2021-11-27 VITALS — BP 142/70 | HR 62 | Temp 97.8°F | Resp 18 | Ht 74.0 in | Wt 217.8 lb

## 2021-11-27 DIAGNOSIS — Z0000 Encounter for general adult medical examination without abnormal findings: Secondary | ICD-10-CM

## 2021-11-27 DIAGNOSIS — E7800 Pure hypercholesterolemia, unspecified: Secondary | ICD-10-CM | POA: Diagnosis not present

## 2021-11-27 LAB — LIPID PANEL
Cholesterol: 110 mg/dL (ref 0–200)
HDL: 45.4 mg/dL (ref >39.00)
LDL Cholesterol: 40 mg/dL (ref 0–99)
NonHDL: 64.22
Total CHOL/HDL Ratio: 2
Triglycerides: 121 mg/dL (ref 0.0–149.0)
VLDL: 24.2 mg/dL (ref 0.0–40.0)

## 2021-11-27 LAB — PSA: PSA: 0.26 ng/mL (ref 0.10–4.00)

## 2021-11-27 NOTE — Patient Instructions (Signed)
Check the  blood pressure regularly ?BP GOAL is between 110/65 and  135/85. ?If it is consistently higher or lower, let me know ? ? ?Please read information about advance care planning ? ?GO TO THE LAB : Get the blood work   ? ? ?[place], [place] ?Come back for a checkup in 6 months ? ? ? ? ? ?Preventing Skin Cancer, Adult ?Skin cancer is the most common type of cancer. There are three main types: ?Squamous cell. ?Basal cell. ?Melanoma. ?Squamous cell and basal cell skin cancer are the most common types. Melanoma is the most dangerous type. Most skin cancers are caused by skin damage from exposure to ultraviolet (UV) light. UV light comes from the sun and from artificial tanning beds. Suntans and sunburns result from exposure to UV light. ?Skin cancer occurs in people of all skin colors. Skin cancer occurs most often in older people, but it is usually the result of damage done earlier in life. The tans and sunburns you get at any age can lead to skin cancer in the future. To help prevent this, you can take steps to protect yourself. ?What actions can I take to protect myself from skin cancer? ?Many people like to get a tan, especially in the summer or when on vacation. However, tan or burned skin is a sign of skin damage and increases your risk for skin cancer. To lower your risk: ?Avoid exposure to UV light ? ?Try to stay out of the sun between 10 a.m. and 4 p.m. whenever possible. This is when the sun is at its strongest. Seek the shade during this time. ?Remember that you can also be exposed to UV rays on cloudy or hazy days. Nancy Fetter exposure can be risky year-round, not just in the summer. ?Do not use a sunlamp, tanning bed, or tanning booth to get a tan. If you really want a tan, use an artificial tanning lotion. ?Avoid getting sunburned. Sunburns are more common on bright sunny days, especially when you are in areas where the sun is reflected off water or snow. ?Use sunscreen  and protective clothing ? ?Always use sunscreen--either a cream, lotion, or spray--when you are out in the sun. Keep sunscreen handy, such as in your gym bag or in your car, so that you will have it when you need it. ?Use sunscreen with a sun protection factor (SPF) of 30 or higher. ?Make sure your sunscreen protects you from UVA and UVB light. It should also be water-resistant. ?Use enough sunscreen to cover all exposed areas of your skin. Put it on 15-30 minutes before you go out. Reapply sunscreen every 2 hours or anytime you come out of the water. ?When you are out in the sun, wear a broad-brimmed hat, clothing that covers your arms and legs, and wraparound sunglasses. ?Protect your lips by wearing a lip balm or lip stick with an SPF of at least 30. ?Check your skin for changes ?Check your skin often from head to toe to look for any changes in the size, color, or shape of any moles or freckles. Check for any new moles or moles that bleed or become itchy. See your health care provider if you notice any changes. ?Ask your health care provider about a total skin check. Ask if it should be part of your yearly physical or if you need to see a skin specialist (dermatologist). ?Take other preventive measures ? ?Avoid exposure to harmful chemicals, such as arsenic.  You can do this by: ?Having your home's water tested for arsenic and other chemicals. ?Taking protective measures to avoid exposure to chemicals at work. ?Do not use any products that contain nicotine or tobacco. These products include cigarettes, chewing tobacco, and vaping devices, such as e-cigarettes. If you need help quitting, ask your health care provider. ?Keep your immune system healthy. Take steps, such as: ?Staying up to date on all vaccines, including the human papillomavirus (HPV) vaccine. ?Eating at least 5 servings of fruits and vegetables every day. ?Why are these changes important? ?About 1 of every 5 people will get skin cancer. The best way  to reduce your risk is to avoid skin damage from UV light. If you have teenagers in your house, they should know that just five bad sunburns as a teen could double their risk of skin cancer in the future. If you have younger children, always make sure to protect their skin from the sun. ?These changes can help reduce your risk of skin cancer. They will also provide other health benefits, such as: ?Protecting your skin from the sun can help prevent painful sunburns, sun poisoning, and other skin damage and blemishes. This is especially important if: ?You have pale white skin, freckles, and red hair. ?You burn easily. ?Avoiding exposure to harmful chemicals can help prevent damage to other tissues in your body, such as your lungs, and prevent other types of cancer. ?Avoiding smoking tobacco can reduce your risk for other types of cancer and other health problems. ?Eating a healthy diet is good for your overall health. ?What can happen if changes are not made? ?If you do not make these changes, you will be at higher risk for skin cancer. If you develop skin cancer, the treatments could result in lost time from work and changes in your appearance from scars. The most dangerous type of skin cancer, melanoma, can be deadly if not found early. ?Where to find support ?For more support, talk to your health care provider or dermatologist. ?Where to find more information ?Learn more about skin cancer from: ?The [place]: www.skincancer.org/prevention ?The Centers for Disease Control and Prevention: FabVets.se ?The American Academy of Dermatology: http://jones-macias.info/ ?Summary ?Skin cancer is the most common type of cancer. ?Melanoma skin cancer can be deadly if not found early. ?Sunburns and tanning increase your risk for skin cancer. ?Protecting your skin from UV light is the best way to prevent skin cancer. ?This information is not intended to replace advice given to you by your health care provider. Make  sure you discuss any questions you have with your health care provider. ?Document Revised: 02/04/2021 Document Reviewed: 02/04/2021 ?Elsevier Patient Education ? [place]. ? ?

## 2021-11-27 NOTE — Progress Notes (Signed)
? ?Subjective:  ? ? Patient ID: Randy Foots., male    DOB: [birth year], 71 y.o.   MRN: [id number] ? ?DOS:  11/27/2021 ?Type of visit - description: CPX ? ?Since the last office visit is doing well. ?Today we did a physical exam and discussed all his chronic medical problems. ?Notes from cardiology and  Endo reviewed  ? ?BP Readings from Last 3 Encounters:  ?11/27/21 (!) 142/70  ?11/20/21 (!) 132/58  ?11/18/21 128/74  ? ? ? ?Review of Systems ? ?Other than above, a 14 point review of systems is negative  ? ? ? ? ?Past Medical History:  ?Diagnosis Date  ? Anxiety   ? Arthritis   ? BCC (basal cell carcinoma of skin) 12/2020  ? CAD (coronary artery disease)   ? a. NSTEMI 12/12: EF 40-45%. [id number] balloon PTCA + Promus DES x 1 to mid LAD;  b. 11/2012: Cath with Cutting balloon PTCA  LAD for ISR to LAD, EF 55%;  c. 12/2012 Cath/PCI: LAD stents patent, 80 ost Diag (jailed)->PTCA, LCX/RCA patent.  ? Essential hypertension   ? GI bleed   ? History of blood transfusion   ? was getting 4 units  ? Hyperlipidemia   ? Ischemic cardiomyopathy   ? a.  echo 08/06/11: dist ant wall, apical and septal and infero-apical HK, mild LVH, EF 40%, mild LAE, PASP 34, asc aorta mildly dilated, mild TR;  b.  Echo (3/13) showed recovery of LV systolic function with EF 70-17%, grade I diastolic dysfunction, mild MR.   ? Myocardial infarction [facility])   ? twice with NSTEMI 2012  ? Palpitations   ? Type 2 diabetes mellitus ([place])   ? ? ?Past Surgical History:  ?Procedure Laterality Date  ? ANTERIOR CERVICAL DECOMP/DISCECTOMY FUSION  in the 90s  ? CHOLECYSTECTOMY  03/27/2019  ? COLONOSCOPY  2021  ? COLONOSCOPY WITH ESOPHAGOGASTRODUODENOSCOPY (EGD)  10/2019  ? CORONARY ANGIOPLASTY  12/15/2012; 12/22/2012  ? CORONARY ANGIOPLASTY WITH STENT PLACEMENT  07/2011  ? "1" (12/22/2012)  ? GASTRIC BYPASS  12/21/2017  ? KNEE ARTHROSCOPY Left 12/31/2016  ? LEFT HEART CATHETERIZATION WITH CORONARY ANGIOGRAM N/A 08/06/2011  ? Procedure: LEFT HEART CATHETERIZATION WITH  CORONARY ANGIOGRAM;  Surgeon: Burnell Blanks, MD;  Location: [facility] CATH LAB;  Service: Cardiovascular;  Laterality: N/A;  ? LEFT HEART CATHETERIZATION WITH CORONARY ANGIOGRAM N/A 12/15/2012  ? Procedure: LEFT HEART CATHETERIZATION WITH CORONARY ANGIOGRAM;  Surgeon: Sherren Mocha, MD;  Location: [facility] CATH LAB;  Service: Cardiovascular;  Laterality: N/A;  ? LEFT HEART CATHETERIZATION WITH CORONARY ANGIOGRAM N/A 12/22/2012  ? Procedure: LEFT HEART CATHETERIZATION WITH CORONARY ANGIOGRAM;  Surgeon: Sherren Mocha, MD;  Location: [facility] CATH LAB;  Service: Cardiovascular;  Laterality: N/A;  ? PERCUTANEOUS CORONARY STENT INTERVENTION (PCI-S) Right 12/15/2012  ? Procedure: PERCUTANEOUS CORONARY STENT INTERVENTION (PCI-S);  Surgeon: Sherren Mocha, MD;  Location: [facility] CATH LAB;  Service: Cardiovascular;  Laterality: Right;  ? ?Social History  ? ?Socioeconomic History  ? Marital status: Married  ?  Spouse name: Not on file  ? Number of children: 2   ? Years of education: Not on file  ? Highest education level: Not on file  ?Occupational History  ? Occupation: Audiological scientist  ?Tobacco Use  ? Smoking status: Former  ?  Packs/day: 1.00  ?  Years: 25.00  ?  Pack years: 25.00  ?  Types: Cigarettes  ?  Quit date: 04/12/1992  ?  Years since quitting: 29.6  ? Smokeless tobacco: Never  ?Vaping Use  ?  Vaping Use: Never used  ?Substance and Sexual Activity  ? Alcohol use: Yes  ?  Comment: 12/22/2012 "rarely maybe every 3-4 months, beer"  ? Drug use: No  ? Sexual activity: Yes  ?Other Topics Concern  ? Not on file  ?Social History Narrative  ? Moved back to [place] from [place]  ? Household-- pt and wife  ? Hobby: Advance carpentry  ? ?Social Determinants of Health  ? ?Financial Resource Strain: Low Risk   ? Difficulty of Paying Living Expenses: Not very hard  ?Food Insecurity: No Food Insecurity  ? Worried About Charity fundraiser in the Last Year: Never true  ? Ran Out of Food in the Last Year: Never true  ?Transportation Needs: No  Transportation Needs  ? Lack of Transportation (Medical): No  ? Lack of Transportation (Non-Medical): No  ?Physical Activity: Insufficiently Active  ? Days of Exercise per Week: 3 days  ? Minutes of Exercise per Session: 30 min  ?Stress: No Stress Concern Present  ? Feeling of Stress : Not at all  ?Social Connections: Moderately Integrated  ? Frequency of Communication with Friends and Family: More than three times a week  ? Frequency of Social Gatherings with Friends and Family: More than three times a week  ? Attends Religious Services: More than 4 times per year  ? Active Member of Clubs or Organizations: No  ? Attends Archivist Meetings: Never  ? Marital Status: Married  ?Intimate Partner Violence: Not At Risk  ? Fear of Current or Ex-Partner: No  ? Emotionally Abused: No  ? Physically Abused: No  ? Sexually Abused: No  ? ? ?Current Outpatient Medications  ?Medication Instructions  ? aspirin EC 81 mg, Oral, Daily  ? atorvastatin (LIPITOR) 80 MG tablet TAKE ONE TABLET BY MOUTH EVERYDAY AT BEDTIME  ? Blood Glucose Monitoring Suppl (ONE TOUCH ULTRA 2) w/Device KIT Does not apply  ? carvedilol (COREG) 12.5 MG tablet TAKE ONE TABLET BY MOUTH EVERY MORNING and TAKE ONE TABLET BY MOUTH EVERYDAY AT BEDTIME  ? Continuous Blood Gluc Sensor (FREESTYLE LIBRE 2 SENSOR) MISC 1 Device, Does not apply, Every 14 days  ? dapagliflozin propanediol (FARXIGA) 5 mg, Oral, Daily  ? FEROSUL 325 (65 Fe) MG tablet TAKE 1 TABLET BY MOUTH DAILY  ? FLUoxetine (PROZAC) 40 mg, Oral, Daily  ? glucose blood (ONETOUCH ULTRA) test strip Check blood sugar 2-3 times daily.  Dx Code: E11.9  ? Lantus SoloStar 20 Units, Subcutaneous, Daily  ? losartan (COZAAR) 50 MG tablet TAKE ONE TABLET BY MOUTH EVERYDAY AT BEDTIME  ? magnesium oxide (MAG-OX) 400 MG tablet TAKE ONE TABLET BY MOUTH EVERY MORNING and TAKE ONE TABLET BY MOUTH EVERYDAY AT BEDTIME  ? metFORMIN (GLUCOPHAGE) 1,000 mg, Oral, 2 times daily with meals  ? nitroGLYCERIN (NITROSTAT)  0.4 mg, Sublingual, Every 5 min PRN  ? pantoprazole (PROTONIX) 40 MG tablet TAKE ONE TABLET BY MOUTH EVERY MORNING and TAKE ONE TABLET BY MOUTH EVERYDAY AT BEDTIME  ? Potassium 99 MG TABS 1 tablet, Oral, Daily  ? primidone (MYSOLINE) 50 MG tablet TAKE THREE TABLETS BY MOUTH EVERY MORNING and TAKE TWO TABLETS BY MOUTH EVERYDAY AT BEDTIME  ? vitamin B-12 (CYANOCOBALAMIN) 1,000 mcg, Oral, Daily  ? ? ?   ?Objective:  ? Physical Exam ?BP (!) 142/70   Pulse 62   Temp 97.8 ?F (36.6 ?C) (Oral)   Resp 18   Ht _0  (1.88 m)   Wt 217 lb 12.8 oz (  98.8 kg)   SpO2 97%   BMI 27.96 kg/m?  ?General: ?Well developed, NAD, BMI noted ?Neck: No  thyromegaly.  Normal carotid pulses. ?HEENT:  ?Normocephalic . Face symmetric, atraumatic ?Lungs:  ?CTA B ?Normal respiratory effort, no intercostal retractions, no accessory muscle use. ?Heart: RRR,  no murmur.  ?Abdomen:  ?Not distended, soft, non-tender. No rebound or rigidity.   ?Lower extremities: no pretibial edema bilaterally  ?Skin: Exposed areas without rash. Not pale. Not jaundice ?Neurologic:  ?alert & oriented X3.  ?Speech normal, gait appropriate for age and unassisted ?Strength symmetric and appropriate for age.  ?Psych: ?Cognition and judgment appear intact.  ?Cooperative with normal attention span and concentration.  ?Behavior appropriate. ?No anxious or depressed appearing. ? ?   ?Assessment   ? ?Assessment   ?DM : insulin dependent,  w/ CAD CHF. 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  ?CHF, ischemic cardiomyopathy, PVCs (Holter 2019 show 13.6% burden) ?OSA dx ~08-2017, on Cpap ?Anxiety-depression ?Essential Tremor dx 2019  ?Skin cancer: ?Morbid obesity:gastric sleeve, 11-2017 Dr Raul Del ?09-2019 GI bleed, gastric ulcer, required transfusion ?B12 deficiency Dx 2022 ? ? ?PLAN ?Here for CPX ?DM: Saw endocrinology 11/18/2021, A1c 7.8 ?HTN: Good med compliance,  ambulatory BPs typically better than today (142/70).  No change ?CAD, CHF ?Saw cardiology 11/20/2021: Felt to be clinically stable, echo 11/26/2021: EF 45 to 50%, stable. ?History of skin cancer: Has not seen Derm re

## 2021-11-28 ENCOUNTER — Encounter: Payer: Self-pay | Admitting: Internal Medicine

## 2021-11-28 NOTE — Assessment & Plan Note (Signed)
--  Td  2014 ?-PNM 23: 2012, 2020;  prevnar 2016 ?- zostavax 2014 ?-s/p shingrex   ?-COVID VAX- UTD ?--CCS:   Cologuard NEG 01/24/17, C-scope 10/23/19 (done in [place]) , 12/26/19 Dr Bryan Lemma, next per GI ?-- Prostate ca screening: declined DRE, check PSA ?--Patient education: Diet, exercise.  ACP information provided ?--Labs: FLP, PSA ? ?  ?

## 2021-11-28 NOTE — Assessment & Plan Note (Signed)
Here for CPX ?DM: Saw endocrinology 11/18/2021, A1c 7.8 ?HTN: Good med compliance, ambulatory BPs typically better than today (142/70).  No change ?CAD, CHF ?Saw cardiology 11/20/2021: Felt to be clinically stable, echo 11/26/2021: EF 45 to 50%, stable. ?History of skin cancer: Has not seen Derm regularly, recommend self skin check. ?B12 deficiency: On supplements, last levels okay. ?RTC 6 months ?

## 2021-12-03 ENCOUNTER — Other Ambulatory Visit: Payer: Self-pay | Admitting: Neurology

## 2021-12-03 DIAGNOSIS — G250 Essential tremor: Secondary | ICD-10-CM

## 2021-12-05 ENCOUNTER — Other Ambulatory Visit: Payer: Self-pay

## 2021-12-05 ENCOUNTER — Encounter (HOSPITAL_COMMUNITY): Payer: Self-pay

## 2021-12-05 ENCOUNTER — Emergency Department (HOSPITAL_COMMUNITY): Payer: PPO

## 2021-12-05 ENCOUNTER — Inpatient Hospital Stay (HOSPITAL_COMMUNITY)
Admission: EM | Admit: 2021-12-05 | Discharge: 2021-12-09 | DRG: 247 | Disposition: A | Discharge status: Home / Self Care | Payer: PPO | Attending: Internal Medicine | Admitting: Internal Medicine

## 2021-12-05 DIAGNOSIS — Z9049 Acquired absence of other specified parts of digestive tract: Secondary | ICD-10-CM | POA: Diagnosis not present

## 2021-12-05 DIAGNOSIS — E7800 Pure hypercholesterolemia, unspecified: Secondary | ICD-10-CM | POA: Diagnosis not present

## 2021-12-05 DIAGNOSIS — R7989 Other specified abnormal findings of blood chemistry: Secondary | ICD-10-CM | POA: Diagnosis present

## 2021-12-05 DIAGNOSIS — I2510 Atherosclerotic heart disease of native coronary artery without angina pectoris: Secondary | ICD-10-CM | POA: Diagnosis not present

## 2021-12-05 DIAGNOSIS — I25118 Atherosclerotic heart disease of native coronary artery with other forms of angina pectoris: Secondary | ICD-10-CM | POA: Diagnosis not present

## 2021-12-05 DIAGNOSIS — M1990 Unspecified osteoarthritis, unspecified site: Secondary | ICD-10-CM | POA: Diagnosis present

## 2021-12-05 DIAGNOSIS — I25110 Atherosclerotic heart disease of native coronary artery with unstable angina pectoris: Secondary | ICD-10-CM | POA: Diagnosis present

## 2021-12-05 DIAGNOSIS — K219 Gastro-esophageal reflux disease without esophagitis: Secondary | ICD-10-CM | POA: Diagnosis not present

## 2021-12-05 DIAGNOSIS — J302 Other seasonal allergic rhinitis: Secondary | ICD-10-CM | POA: Diagnosis present

## 2021-12-05 DIAGNOSIS — E785 Hyperlipidemia, unspecified: Secondary | ICD-10-CM | POA: Diagnosis present

## 2021-12-05 DIAGNOSIS — E1165 Type 2 diabetes mellitus with hyperglycemia: Secondary | ICD-10-CM | POA: Diagnosis not present

## 2021-12-05 DIAGNOSIS — Z794 Long term (current) use of insulin: Secondary | ICD-10-CM

## 2021-12-05 DIAGNOSIS — Z7984 Long term (current) use of oral hypoglycemic drugs: Secondary | ICD-10-CM

## 2021-12-05 DIAGNOSIS — Z955 Presence of coronary angioplasty implant and graft: Secondary | ICD-10-CM

## 2021-12-05 DIAGNOSIS — Z981 Arthrodesis status: Secondary | ICD-10-CM

## 2021-12-05 DIAGNOSIS — I10 Essential (primary) hypertension: Secondary | ICD-10-CM | POA: Diagnosis present

## 2021-12-05 DIAGNOSIS — Z85828 Personal history of other malignant neoplasm of skin: Secondary | ICD-10-CM | POA: Diagnosis not present

## 2021-12-05 DIAGNOSIS — Z9884 Bariatric surgery status: Secondary | ICD-10-CM

## 2021-12-05 DIAGNOSIS — I252 Old myocardial infarction: Secondary | ICD-10-CM | POA: Diagnosis not present

## 2021-12-05 DIAGNOSIS — I447 Left bundle-branch block, unspecified: Secondary | ICD-10-CM | POA: Diagnosis present

## 2021-12-05 DIAGNOSIS — Z888 Allergy status to other drugs, medicaments and biological substances status: Secondary | ICD-10-CM | POA: Diagnosis not present

## 2021-12-05 DIAGNOSIS — I214 Non-ST elevation (NSTEMI) myocardial infarction: Secondary | ICD-10-CM | POA: Diagnosis not present

## 2021-12-05 DIAGNOSIS — Z87891 Personal history of nicotine dependence: Secondary | ICD-10-CM

## 2021-12-05 DIAGNOSIS — Z833 Family history of diabetes mellitus: Secondary | ICD-10-CM

## 2021-12-05 DIAGNOSIS — I255 Ischemic cardiomyopathy: Secondary | ICD-10-CM | POA: Diagnosis present

## 2021-12-05 DIAGNOSIS — I77810 Thoracic aortic ectasia: Secondary | ICD-10-CM | POA: Diagnosis present

## 2021-12-05 DIAGNOSIS — F419 Anxiety disorder, unspecified: Secondary | ICD-10-CM | POA: Diagnosis present

## 2021-12-05 DIAGNOSIS — R42 Dizziness and giddiness: Secondary | ICD-10-CM | POA: Diagnosis present

## 2021-12-05 DIAGNOSIS — Z8249 Family history of ischemic heart disease and other diseases of the circulatory system: Secondary | ICD-10-CM

## 2021-12-05 DIAGNOSIS — R778 Other specified abnormalities of plasma proteins: Secondary | ICD-10-CM | POA: Diagnosis not present

## 2021-12-05 DIAGNOSIS — R079 Chest pain, unspecified: Secondary | ICD-10-CM | POA: Diagnosis not present

## 2021-12-05 DIAGNOSIS — Z79899 Other long term (current) drug therapy: Secondary | ICD-10-CM | POA: Diagnosis not present

## 2021-12-05 DIAGNOSIS — Z7982 Long term (current) use of aspirin: Secondary | ICD-10-CM

## 2021-12-05 LAB — BASIC METABOLIC PANEL
Anion gap: 7 (ref 5–15)
BUN: 16 mg/dL (ref 8–23)
CO2: 26 mmol/L (ref 22–32)
Calcium: 9 mg/dL (ref 8.9–10.3)
Chloride: 105 mmol/L (ref 98–111)
Creatinine, Ser: 0.67 mg/dL (ref 0.61–1.24)
GFR, Estimated: >60 mL/min (ref >60)
Glucose, Bld: 163 mg/dL — ABNORMAL HIGH (ref 70–99)
Potassium: 3.5 mmol/L (ref 3.5–5.1)
Sodium: 138 mmol/L (ref 135–145)

## 2021-12-05 LAB — TROPONIN I (HIGH SENSITIVITY)
Troponin I (High Sensitivity): 22 ng/L — ABNORMAL HIGH (ref <18)
Troponin I (High Sensitivity): 85 ng/L — ABNORMAL HIGH (ref <18)

## 2021-12-05 LAB — CBC WITH DIFFERENTIAL/PLATELET
Abs Immature Granulocytes: 0.02 10*3/uL (ref 0.00–0.07)
Basophils Absolute: 0.1 10*3/uL (ref 0.0–0.1)
Basophils Relative: 1 %
Eosinophils Absolute: 0.4 10*3/uL (ref 0.0–0.5)
Eosinophils Relative: 6 %
HCT: 37.7 % — ABNORMAL LOW (ref 39.0–52.0)
Hemoglobin: 12.4 g/dL — ABNORMAL LOW (ref 13.0–17.0)
Immature Granulocytes: 0 %
Lymphocytes Relative: 30 %
Lymphs Abs: 1.9 10*3/uL (ref 0.7–4.0)
MCH: 28.2 pg (ref 26.0–34.0)
MCHC: 32.9 g/dL (ref 30.0–36.0)
MCV: 85.9 fL (ref 80.0–100.0)
Monocytes Absolute: 0.6 10*3/uL (ref 0.1–1.0)
Monocytes Relative: 9 %
Neutro Abs: 3.3 10*3/uL (ref 1.7–7.7)
Neutrophils Relative %: 54 %
Platelets: 264 10*3/uL (ref 150–400)
RBC: 4.39 MIL/uL (ref 4.22–5.81)
RDW: 13.7 % (ref 11.5–15.5)
WBC: 6.3 10*3/uL (ref 4.0–10.5)
nRBC: 0 % (ref 0.0–0.2)

## 2021-12-05 MED ORDER — ASPIRIN 81 MG PO CHEW
324.0000 mg | CHEWABLE_TABLET | Freq: Once | ORAL | Status: AC
  Administered 2021-12-06: 324 mg via ORAL
  Filled 2021-12-05: qty 4

## 2021-12-05 NOTE — ED Triage Notes (Signed)
Pt arrived via POV c/o dull, aching chest pain that started apprx 1 hr PTA. Pt states the pain has eased off some, but still is experiencing discomfort. Denies N/V, SOB.  ?

## 2021-12-05 NOTE — ED Provider Notes (Signed)
?[place] ?Provider Note ? ? ?CSN: [id number] ?Arrival date & time: 12/05/21  1904 ? ?  ? ?History ? ?Chief Complaint  ?Patient presents with  ? Chest Pain  ? ? ?Randy Castro. is a 71 y.o. male. ? ?HPI ?Patient here for evaluation of chest pain described as dull and aching.  It started 1 hour prior to arrival and has improved somewhat.  He states he was doing some light work in his basement when the pain started.  He has had this kind of pain previously but not often.  He has nitroglycerin but did not take any today.  He states the pain is almost completely resolved at this time.  There is no associated diaphoresis, nausea or dizziness.  He is here with his wife who helps to give history. ? ?PCP evaluation 11/27/2021 for ongoing management of diabetes hypertension hyperlipidemia.  ? ?Cardiology evaluation 11/20/2021.  Routine visit.  EKG at that time was sinus rhythm with second-degree type I block and interventricular conduction delay.  Last echocardiogram 2021 with an EF of 50 to 55%.  Patient has heart failure with reduced ejection fraction.  He has a history of ischemic cardiomyopathy.  His EF has improved.  He has prior PCA of the LAD coronary artery.  He was not having anginal symptoms. ?  ? ?Home Medications ?Prior to Admission medications   ?Medication Sig Start Date End Date Taking? Authorizing Provider  ?aspirin EC 81 MG tablet Take 81 mg by mouth daily.    [provider]  ?atorvastatin (LIPITOR) 80 MG tablet TAKE ONE TABLET BY MOUTH EVERYDAY AT BEDTIME 09/18/21   Colon Branch, MD  ?Blood Glucose Monitoring Suppl (ONE TOUCH ULTRA 2) w/Device KIT by Does not apply route.    [provider]  ?carvedilol (COREG) 12.5 MG tablet TAKE ONE TABLET BY MOUTH EVERY MORNING and TAKE ONE TABLET BY MOUTH EVERYDAY AT BEDTIME 09/18/21   Colon Branch, MD  ?Continuous Blood Gluc Sensor (FREESTYLE LIBRE 2 SENSOR) MISC 1 Device by Does not apply route every 14 (fourteen) days. 11/18/21    Shamleffer, Melanie Crazier, MD  ?dapagliflozin propanediol (FARXIGA) 5 MG TABS tablet Take 1 tablet (5 mg total) by mouth daily. 11/18/21   Shamleffer, Melanie Crazier, MD  ?FEROSUL 325 (65 Fe) MG tablet TAKE 1 TABLET BY MOUTH DAILY 06/10/20   Cirigliano, Vito V, DO  ?FLUoxetine (PROZAC) 40 MG capsule Take 1 capsule (40 mg total) by mouth daily. 07/23/21   Colon Branch, MD  ?glucose blood (ONETOUCH ULTRA) test strip Check blood sugar 2-3 times daily.  Dx Code: E11.9 05/27/20   Colon Branch, MD  ?insulin glargine (LANTUS SOLOSTAR) 100 UNIT/ML Solostar Pen Inject 20 Units into the skin daily. 11/18/21   Shamleffer, Melanie Crazier, MD  ?losartan (COZAAR) 50 MG tablet TAKE ONE TABLET BY MOUTH EVERYDAY AT BEDTIME 09/18/21   Colon Branch, MD  ?magnesium oxide (MAG-OX) 400 MG tablet TAKE ONE TABLET BY MOUTH EVERY MORNING and TAKE ONE TABLET BY MOUTH EVERYDAY AT BEDTIME 09/18/21   Colon Branch, MD  ?metFORMIN (GLUCOPHAGE) 1000 MG tablet Take 1 tablet (1,000 mg total) by mouth 2 (two) times daily with a meal. 11/18/21   Shamleffer, Melanie Crazier, MD  ?nitroGLYCERIN (NITROSTAT) 0.4 MG SL tablet Place 1 tablet (0.4 mg total) under the tongue every 5 (five) minutes as needed for chest pain. 11/03/18   Satira Sark, MD  ?pantoprazole (PROTONIX) 40 MG tablet TAKE ONE TABLET  BY MOUTH EVERY MORNING and TAKE ONE TABLET BY MOUTH EVERYDAY AT BEDTIME 09/18/21   Colon Branch, MD  ?Potassium 99 MG TABS Take 1 tablet by mouth daily.    [provider]  ?primidone (MYSOLINE) 50 MG tablet TAKE THREE TABLETS BY MOUTH EVERY MORNING and TAKE TWO TABLETS BY MOUTH EVERYDAY AT BEDTIME 12/03/21   Narda Amber K, DO  ?vitamin B-12 (CYANOCOBALAMIN) 1000 MCG tablet Take 1,000 mcg by mouth daily.    [provider]  ?   ? ?Allergies    ?Trulicity [dulaglutide]   ? ?Review of Systems   ?Review of Systems ? ?Physical Exam ?Updated Vital Signs ?BP (!) 168/72 (BP Location: Right Arm)   Pulse 61   Temp 97.9 ?F (36.6 ?C) (Oral)   Resp 18    Ht 6' 2"  (1.88 m)   Wt 97.5 kg   SpO2 99%   BMI 27.60 kg/m?  ?Physical Exam ?Vitals and nursing note reviewed.  ?Constitutional:   ?   General: He is not in acute distress. ?   Appearance: He is well-developed. He is not ill-appearing.  ?HENT:  ?   Head: Normocephalic and atraumatic.  ?   Right Ear: External ear normal.  ?   Left Ear: External ear normal.  ?   Nose: No congestion.  ?Eyes:  ?   Conjunctiva/sclera: Conjunctivae normal.  ?   Pupils: Pupils are equal, round, and reactive to light.  ?Neck:  ?   Trachea: Phonation normal.  ?Cardiovascular:  ?   Rate and Rhythm: Normal rate and regular rhythm.  ?   Heart sounds: Normal heart sounds.  ?Pulmonary:  ?   Effort: Pulmonary effort is normal. No respiratory distress.  ?   Breath sounds: Normal breath sounds. No stridor.  ?Abdominal:  ?   General: There is no distension.  ?   Palpations: Abdomen is soft.  ?   Tenderness: There is no abdominal tenderness.  ?Musculoskeletal:     ?   General: Normal range of motion.  ?   Cervical back: Normal range of motion and neck supple.  ?Skin: ?   General: Skin is warm and dry.  ?Neurological:  ?   Mental Status: He is alert and oriented to person, place, and time.  ?   Cranial Nerves: No cranial nerve deficit.  ?   Sensory: No sensory deficit.  ?   Motor: No abnormal muscle tone.  ?   Coordination: Coordination normal.  ?Psychiatric:     ?   Mood and Affect: Mood normal.     ?   Behavior: Behavior normal.     ?   Thought Content: Thought content normal.     ?   Judgment: Judgment normal.  ? ? ?ED Results / Procedures / Treatments   ?Labs ?(all labs ordered are listed, but only abnormal results are displayed) ?Labs Reviewed  ?CBC WITH DIFFERENTIAL/PLATELET - Abnormal; Notable for the following components:  ?    Result Value  ? Hemoglobin 12.4 (*)   ? HCT 37.7 (*)   ? All other components within normal limits  ?BASIC METABOLIC PANEL  ?TROPONIN I (HIGH SENSITIVITY)  ? ? ?EKG ?None ? ?Radiology ?DG Chest 2 View ? ?Result  Date: 12/05/2021 ?CLINICAL DATA:  Chest pain EXAM: CHEST - 2 VIEW COMPARISON:  Two-view chest x-ray 11/14/2017 FINDINGS: Heart size is normal. Mild interstitial coarsening is stable. No focal airspace disease present. No edema or effusion is present. IMPRESSION: No acute cardiopulmonary disease  or significant interval change. Electronically Signed   By: San Morelle M.D.   On: 12/05/2021 19:32   ? ?Procedures ?Procedures  ? ? ?Medications Ordered in ED ?Medications - No data to display ? ?ED Course/ Medical Decision Making/ A&P ?Clinical Course as of 12/05/21 2332  ?Fri Dec 05, 2021  ?2332 At this time he remains pain-free. [EW]  ?  ?Clinical Course User Index ?[EW] Daleen Bo, MD  ? ?                        ?Medical Decision Making ?To be evaluated for transient chest pain.  He has a remote history of coronary disease with LAD stenting.  Pain was transient today.  No associated symptoms of diaphoresis or nausea.  Pain resolved by the time he was seen in the ED. ? ?Problems Addressed: ?NSTEMI (non-ST elevated myocardial infarction) [facility]): acute illness or injury ? ?Amount and/or Complexity of Data Reviewed ?External Data Reviewed:  ?   Details: He is a cogent historian ?Labs: ordered. ?   Details: CBC, metabolic panel, delta troponin-hemoglobin low, glucose high, initial troponin mildly elevated, increased to 85 with delta checking. ?Radiology: ordered. ?   Details: Chest x-ray-no infiltrate or edema ?Discussion of management or test interpretation with external provider(s): Case discussed with on-call cardiologist, Dr. Chancy Milroy.  He recommends transfer to [facility], admission to hospitalist, his service will see as consultant, and plan on doing cardiac stress test, tomorrow.  He does not recommend any other medications at this time. ? ?Consult hospitalist for admission ? ?Risk ?OTC drugs. ?Decision regarding hospitalization. ?Risk Details: Patient with NSTEMI, pain resolved, has known coronary disease.   He requires hospitalization.  Per cardiology, admit to hospitalist and transfer to [facility] for stress testing next day.  Patient did not require further treatment in the ED as his pain resolved.  Hospitalis

## 2021-12-06 ENCOUNTER — Observation Stay (HOSPITAL_COMMUNITY): Payer: PPO

## 2021-12-06 ENCOUNTER — Other Ambulatory Visit (HOSPITAL_COMMUNITY): Payer: Self-pay | Admitting: *Deleted

## 2021-12-06 DIAGNOSIS — R778 Other specified abnormalities of plasma proteins: Secondary | ICD-10-CM | POA: Diagnosis present

## 2021-12-06 DIAGNOSIS — I255 Ischemic cardiomyopathy: Secondary | ICD-10-CM

## 2021-12-06 DIAGNOSIS — E1165 Type 2 diabetes mellitus with hyperglycemia: Secondary | ICD-10-CM

## 2021-12-06 DIAGNOSIS — I2510 Atherosclerotic heart disease of native coronary artery without angina pectoris: Secondary | ICD-10-CM | POA: Diagnosis not present

## 2021-12-06 DIAGNOSIS — K219 Gastro-esophageal reflux disease without esophagitis: Secondary | ICD-10-CM

## 2021-12-06 DIAGNOSIS — Z794 Long term (current) use of insulin: Secondary | ICD-10-CM | POA: Diagnosis not present

## 2021-12-06 DIAGNOSIS — Z981 Arthrodesis status: Secondary | ICD-10-CM | POA: Diagnosis not present

## 2021-12-06 DIAGNOSIS — I25110 Atherosclerotic heart disease of native coronary artery with unstable angina pectoris: Secondary | ICD-10-CM | POA: Diagnosis not present

## 2021-12-06 DIAGNOSIS — Z79899 Other long term (current) drug therapy: Secondary | ICD-10-CM | POA: Diagnosis not present

## 2021-12-06 DIAGNOSIS — F419 Anxiety disorder, unspecified: Secondary | ICD-10-CM | POA: Diagnosis not present

## 2021-12-06 DIAGNOSIS — Z8249 Family history of ischemic heart disease and other diseases of the circulatory system: Secondary | ICD-10-CM | POA: Diagnosis not present

## 2021-12-06 DIAGNOSIS — I252 Old myocardial infarction: Secondary | ICD-10-CM | POA: Diagnosis not present

## 2021-12-06 DIAGNOSIS — Z7984 Long term (current) use of oral hypoglycemic drugs: Secondary | ICD-10-CM | POA: Diagnosis not present

## 2021-12-06 DIAGNOSIS — Z85828 Personal history of other malignant neoplasm of skin: Secondary | ICD-10-CM | POA: Diagnosis not present

## 2021-12-06 DIAGNOSIS — E785 Hyperlipidemia, unspecified: Secondary | ICD-10-CM | POA: Diagnosis not present

## 2021-12-06 DIAGNOSIS — I77810 Thoracic aortic ectasia: Secondary | ICD-10-CM | POA: Diagnosis not present

## 2021-12-06 DIAGNOSIS — Z9049 Acquired absence of other specified parts of digestive tract: Secondary | ICD-10-CM | POA: Diagnosis not present

## 2021-12-06 DIAGNOSIS — R42 Dizziness and giddiness: Secondary | ICD-10-CM | POA: Diagnosis not present

## 2021-12-06 DIAGNOSIS — M1990 Unspecified osteoarthritis, unspecified site: Secondary | ICD-10-CM | POA: Diagnosis not present

## 2021-12-06 DIAGNOSIS — I214 Non-ST elevation (NSTEMI) myocardial infarction: Secondary | ICD-10-CM

## 2021-12-06 DIAGNOSIS — I10 Essential (primary) hypertension: Secondary | ICD-10-CM | POA: Diagnosis not present

## 2021-12-06 DIAGNOSIS — Z888 Allergy status to other drugs, medicaments and biological substances status: Secondary | ICD-10-CM | POA: Diagnosis not present

## 2021-12-06 DIAGNOSIS — I447 Left bundle-branch block, unspecified: Secondary | ICD-10-CM | POA: Diagnosis not present

## 2021-12-06 DIAGNOSIS — E7800 Pure hypercholesterolemia, unspecified: Secondary | ICD-10-CM

## 2021-12-06 DIAGNOSIS — Z7982 Long term (current) use of aspirin: Secondary | ICD-10-CM | POA: Diagnosis not present

## 2021-12-06 DIAGNOSIS — Z955 Presence of coronary angioplasty implant and graft: Secondary | ICD-10-CM | POA: Diagnosis not present

## 2021-12-06 DIAGNOSIS — J302 Other seasonal allergic rhinitis: Secondary | ICD-10-CM | POA: Diagnosis not present

## 2021-12-06 LAB — TROPONIN I (HIGH SENSITIVITY)
Troponin I (High Sensitivity): 4601 ng/L (ref <18)
Troponin I (High Sensitivity): 4420 ng/L (ref <18)
Troponin I (High Sensitivity): 4513 ng/L (ref <18)
Troponin I (High Sensitivity): 3053 ng/L (ref <18)

## 2021-12-06 LAB — MAGNESIUM: Magnesium: 1.8 mg/dL (ref 1.7–2.4)

## 2021-12-06 LAB — ECHOCARDIOGRAM LIMITED
Calc EF: 28.9 %
Height: 74 in
S' Lateral: 5.2 cm
Single Plane A2C EF: 32.9 %
Single Plane A4C EF: 33.3 %
Weight: 3440 oz

## 2021-12-06 LAB — COMPREHENSIVE METABOLIC PANEL
ALT: 42 U/L (ref 0–44)
AST: 43 U/L — ABNORMAL HIGH (ref 15–41)
Albumin: 3.6 g/dL (ref 3.5–5.0)
Alkaline Phosphatase: 61 U/L (ref 38–126)
Anion gap: 5 (ref 5–15)
BUN: 11 mg/dL (ref 8–23)
CO2: 30 mmol/L (ref 22–32)
Calcium: 8.9 mg/dL (ref 8.9–10.3)
Chloride: 104 mmol/L (ref 98–111)
Creatinine, Ser: 0.53 mg/dL — ABNORMAL LOW (ref 0.61–1.24)
GFR, Estimated: >60 mL/min (ref >60)
Glucose, Bld: 155 mg/dL — ABNORMAL HIGH (ref 70–99)
Potassium: 3.7 mmol/L (ref 3.5–5.1)
Sodium: 139 mmol/L (ref 135–145)
Total Bilirubin: 0.4 mg/dL (ref 0.3–1.2)
Total Protein: 6.3 g/dL — ABNORMAL LOW (ref 6.5–8.1)

## 2021-12-06 LAB — GLUCOSE, CAPILLARY
Glucose-Capillary: 155 mg/dL — ABNORMAL HIGH (ref 70–99)
Glucose-Capillary: 211 mg/dL — ABNORMAL HIGH (ref 70–99)

## 2021-12-06 LAB — CBC
HCT: 37.9 % — ABNORMAL LOW (ref 39.0–52.0)
Hemoglobin: 12.3 g/dL — ABNORMAL LOW (ref 13.0–17.0)
MCH: 28.2 pg (ref 26.0–34.0)
MCHC: 32.5 g/dL (ref 30.0–36.0)
MCV: 86.9 fL (ref 80.0–100.0)
Platelets: 238 10*3/uL (ref 150–400)
RBC: 4.36 MIL/uL (ref 4.22–5.81)
RDW: 13.9 % (ref 11.5–15.5)
WBC: 6.4 10*3/uL (ref 4.0–10.5)
nRBC: 0 % (ref 0.0–0.2)

## 2021-12-06 LAB — CBG MONITORING, ED
Glucose-Capillary: 271 mg/dL — ABNORMAL HIGH (ref 70–99)
Glucose-Capillary: 164 mg/dL — ABNORMAL HIGH (ref 70–99)

## 2021-12-06 LAB — HIV ANTIBODY (ROUTINE TESTING W REFLEX): HIV Screen 4th Generation wRfx: NONREACTIVE

## 2021-12-06 LAB — HEPARIN LEVEL (UNFRACTIONATED): Heparin Unfractionated: 0.11 IU/mL — ABNORMAL LOW (ref 0.30–0.70)

## 2021-12-06 MED ORDER — PRIMIDONE 50 MG PO TABS
50.0000 mg | ORAL_TABLET | Freq: Two times a day (BID) | ORAL | Status: DC
  Administered 2021-12-06 – 2021-12-09 (×6): 50 mg via ORAL
  Filled 2021-12-06 (×7): qty 1

## 2021-12-06 MED ORDER — ATORVASTATIN CALCIUM 80 MG PO TABS
80.0000 mg | ORAL_TABLET | Freq: Every day | ORAL | Status: DC
  Administered 2021-12-06 – 2021-12-09 (×3): 80 mg via ORAL
  Filled 2021-12-06: qty 1
  Filled 2021-12-06: qty 2
  Filled 2021-12-06: qty 1

## 2021-12-06 MED ORDER — NITROGLYCERIN IN D5W 200-5 MCG/ML-% IV SOLN
0.0000 ug/min | INTRAVENOUS | Status: DC
  Administered 2021-12-06: 5 ug/min via INTRAVENOUS
  Administered 2021-12-07: 30 ug/min via INTRAVENOUS
  Filled 2021-12-06 (×2): qty 250

## 2021-12-06 MED ORDER — ONDANSETRON HCL 4 MG/2ML IJ SOLN
4.0000 mg | Freq: Four times a day (QID) | INTRAMUSCULAR | Status: DC | PRN
  Administered 2021-12-07: 4 mg via INTRAVENOUS
  Filled 2021-12-06: qty 2

## 2021-12-06 MED ORDER — HEPARIN (PORCINE) 25000 UT/250ML-% IV SOLN
1600.0000 [IU]/h | INTRAVENOUS | Status: DC
Start: 2021-12-06 — End: 2021-12-08
  Administered 2021-12-06: 1300 [IU]/h via INTRAVENOUS
  Administered 2021-12-06 – 2021-12-08 (×2): 1600 [IU]/h via INTRAVENOUS
  Filled 2021-12-06 (×4): qty 250

## 2021-12-06 MED ORDER — INSULIN ASPART 100 UNIT/ML IJ SOLN: 0.0000 [IU] | Freq: Every day | INTRAMUSCULAR | Status: DC

## 2021-12-06 MED ORDER — OXYCODONE HCL 5 MG PO TABS
5.0000 mg | ORAL_TABLET | ORAL | Status: DC | PRN
  Administered 2021-12-06 – 2021-12-08 (×5): 5 mg via ORAL
  Filled 2021-12-06 (×5): qty 1

## 2021-12-06 MED ORDER — HEPARIN SODIUM (PORCINE) 5000 UNIT/ML IJ SOLN: 5000.0000 [IU] | Freq: Three times a day (TID) | INTRAMUSCULAR | Status: DC

## 2021-12-06 MED ORDER — HEPARIN BOLUS VIA INFUSION
2000.0000 [IU] | Freq: Once | INTRAVENOUS | Status: AC
Start: 2021-12-06 — End: 2021-12-06
  Administered 2021-12-06: 2000 [IU] via INTRAVENOUS

## 2021-12-06 MED ORDER — PANTOPRAZOLE SODIUM 40 MG PO TBEC
40.0000 mg | DELAYED_RELEASE_TABLET | Freq: Every day | ORAL | Status: DC
  Administered 2021-12-06 – 2021-12-09 (×3): 40 mg via ORAL
  Filled 2021-12-06 (×4): qty 1

## 2021-12-06 MED ORDER — ASPIRIN EC 81 MG PO TBEC
81.0000 mg | DELAYED_RELEASE_TABLET | Freq: Every day | ORAL | Status: DC
  Administered 2021-12-06 – 2021-12-09 (×4): 81 mg via ORAL
  Filled 2021-12-06 (×4): qty 1

## 2021-12-06 MED ORDER — HEPARIN BOLUS VIA INFUSION
4000.0000 [IU] | Freq: Once | INTRAVENOUS | Status: AC
  Administered 2021-12-06: 4000 [IU] via INTRAVENOUS

## 2021-12-06 MED ORDER — INSULIN ASPART 100 UNIT/ML IJ SOLN
0.0000 [IU] | Freq: Three times a day (TID) | INTRAMUSCULAR | Status: DC
  Administered 2021-12-06: 3 [IU] via SUBCUTANEOUS
  Administered 2021-12-06: 8 [IU] via SUBCUTANEOUS
  Administered 2021-12-06 – 2021-12-07 (×2): 3 [IU] via SUBCUTANEOUS
  Administered 2021-12-07: 5 [IU] via SUBCUTANEOUS
  Administered 2021-12-07: 8 [IU] via SUBCUTANEOUS
  Administered 2021-12-08: 3 [IU] via SUBCUTANEOUS
  Administered 2021-12-08: 5 [IU] via SUBCUTANEOUS
  Administered 2021-12-09: 3 [IU] via SUBCUTANEOUS
  Filled 2021-12-06 (×2): qty 1

## 2021-12-06 MED ORDER — LOSARTAN POTASSIUM 50 MG PO TABS
50.0000 mg | ORAL_TABLET | Freq: Every day | ORAL | Status: DC
  Administered 2021-12-06 – 2021-12-07 (×2): 50 mg via ORAL
  Filled 2021-12-06 (×3): qty 1
  Filled 2021-12-06: qty 2

## 2021-12-06 MED ORDER — FLUOXETINE HCL 20 MG PO CAPS
40.0000 mg | ORAL_CAPSULE | Freq: Every day | ORAL | Status: DC
  Administered 2021-12-06 – 2021-12-09 (×3): 40 mg via ORAL
  Filled 2021-12-06 (×4): qty 2

## 2021-12-06 MED ORDER — ACETAMINOPHEN 325 MG PO TABS
650.0000 mg | ORAL_TABLET | ORAL | Status: DC | PRN
  Administered 2021-12-07 – 2021-12-08 (×2): 650 mg via ORAL
  Filled 2021-12-06 (×2): qty 2

## 2021-12-06 MED ORDER — MELATONIN 3 MG PO TABS
6.0000 mg | ORAL_TABLET | Freq: Every evening | ORAL | Status: DC | PRN
  Administered 2021-12-07: 6 mg via ORAL
  Filled 2021-12-06: qty 2

## 2021-12-06 MED ORDER — CARVEDILOL 12.5 MG PO TABS
12.5000 mg | ORAL_TABLET | Freq: Two times a day (BID) | ORAL | Status: DC
  Administered 2021-12-06 – 2021-12-09 (×5): 12.5 mg via ORAL
  Filled 2021-12-06 (×6): qty 1

## 2021-12-06 MED ORDER — INSULIN DETEMIR 100 UNIT/ML ~~LOC~~ SOLN
10.0000 [IU] | Freq: Every day | SUBCUTANEOUS | Status: DC
  Administered 2021-12-06 – 2021-12-08 (×3): 10 [IU] via SUBCUTANEOUS
  Filled 2021-12-06 (×6): qty 0.1

## 2021-12-06 NOTE — Assessment & Plan Note (Signed)
-   Resume home medications, beta-blocker, statin, ARB, aspirin ?-Patient has had 2 stents placed 22,012 and 08/2012 ?-Recent follow-up with cardiology at the end of March ?-Echo done at the beginning of April that shows ejection fraction of 45-50 percent, with LV mildly decreased function.  LV demonstrates global hypokinesis.  LVH present.  Diastolic parameters are consistent with grade 2 diastolic dysfunction. ?-Monitor on telemetry ? ?

## 2021-12-06 NOTE — Progress Notes (Addendum)
Patient seen and examined; admitted after midnight secondary to chest pain; patient also expressed having dyspnea on exertion.  Troponin initially in the 20s and then 85; follow cyclin demonstrating troponin up to 4061; EKG without acute ischemic changes.  Patient with a criteria for NSTEMI.  Discussed with cardiology service who has recommended transfer to [facility] for further evaluation and management.  Heparin drip started.  Please refer to H&P written by Dr. Clearence Ped for further info/details on admission. ? ?Plan: ?-Currently chest pain-free and hemodynamically stable. ?-Continue aspirin, beta-blocker, ARB and statin. ?-If patient develops any further chest discomfort will initiate IV nitroglycerin. ?-Follow cardiology service recommendations. ?-will cycle troponin again to find peak elevation ?-Continue telemetry monitoring  ?-following recommendations by cardiology will repeat echocardiogram (limited), to rule out WMA. ? ? ? ?Randy Dubois MD ?[phone] ? ?

## 2021-12-06 NOTE — Consult Note (Addendum)
?Cardiology Consultation:  ? ?Patient ID: Randy Castro. ?MRN: [id number]; DOB: Nov 06, 1950 ? ?Admit date: 12/05/2021 ?Date of Consult: 12/06/2021 ? ?PCP:  Colon Branch, MD ?  ?[facility] Providers ?Cardiologist:  Rozann Lesches, MD   ? ? ?Patient Profile:  ? ?Randy Castro. is a 71 y.o. male with a history of CAD s/p PTCA/DES to mid LAD in 07/2011 with repeat PTCA in 11/2012 for in-stent restenosis and then PTCA of jailed ostial diagonal in 12/2012, ischemic cardiomyopathy with EF as low as 40% in 2012 improved to 45 to 50% on last on 11/26/2021, hyperlipidemia, type 2 diabetes, prior GI bleed quiring transfusions, and anxiety who is being seen 12/06/2021 for the evaluation of chest pain at the request of Dr. Erlinda Hong. ? ?History of Present Illness:  ? ?[name] is a 71 year old male with the above history who is followed by Dr. Domenic Polite.  He has a history of NSTEMI in 07/2019 12 and underwent PTCA and DES to mid LAD.  Cardiac cath in 11/2002 showed in-stent to the mid LAD she underwent repeat PTCA.  Last cath in 12/2012 showed patent LAD stent with 80% ostial diagonal lesion that was jailed.  She underwent PTCA of this lesion as well.  Ischemia evaluation was a Myoview in 11/2017 for preop evaluation which showed findings consistent with prior MI but no ischemia.  Has known ischemic cardiomyopathy with EF as low as 40% at time of NSTEMI in 07/2011 with normalization of EF following this.  Recently seen by Dr. Domenic Polite on 11/20/2021 at which time he was doing well with stable NYHA class II dyspnea and no active angina.  Echo was ordered to reassess LV function and showed EF of 45-50% with global hypokinesis, moderate LVH, and grade 2 diastolic dysfunction.  Also showed normal RV, severely dilated left atrium, mild MR, and moderate dilatation of the aortic root measuring 43 mm. ? ?Patient presented to [facility] ED on 12/05/2021 further evaluation of chest pain. .  EKG showed normal sinus rhythm with epsilon wave noted in  V1 and LVH. High-sensitivity troponin elevated at 22 >> 85 >> 4,601 >> 4,420 >> 4,514.  Chest x-ray showed no acute findings. WBC 6.3, Hgb 12.4, Plts 264. Na 138, K 3.5, Glucose 163, BUN 16, Cr 0.67. Patient was started on IV Heparin and transferred to [facility] for further evaluation. ? ?Upon arrival to [facility], he is resting comfortably in no acute distress. He reports he was in his usual state of health until last night when he was working in his work shop and started having substernal 6/10 chest pressure that radiated to his left shoulder/arm. He had some associated shortness of breath with this but no associated nausea, vomiting, or diaphoresis.  He states this pain reminds him of the pain he had prior to his PCI's in 2012 and 2004 but not as severe.  Pain persisted for about 30 minutes and then he decided to go to the ED for further evaluation.  Pain has improved but he is still currently having 3/10 chest pain right.  He reports some chronic shortness of breath with exertion if he is walking quickly near after climbing stairs but this is not new.  No shortness of breath at rest.  No orthopnea, PND, lower extremity edema.  He reports occasional palpitations that he describes as a hard and fast beat that comes out of nowhere and last for 10 minutes and then resolves.  He also has some chronic  dizziness felt to be due to vertigo. It sounds like he had the Epley maneuvers done with resolution of dizziness.  No falls or syncope.  He has some seasonal allergies but denies any other recent illnesses.  No abnormal bleeding in urine or stools. ? ?Past Medical History:  ?Diagnosis Date  ? Anxiety   ? Arthritis   ? BCC (basal cell carcinoma of skin) 12/2020  ? CAD (coronary artery disease)   ? a. NSTEMI 12/12: EF 40-45%. EHU:[id number] balloon PTCA + Promus DES x 1 to mid LAD;  b. 11/2012: Cath with [place] balloon PTCA  LAD for ISR to LAD, EF 55%;  c. 12/2012 Cath/PCI: LAD stents patent, 80 ost Diag (jailed)->PTCA,  LCX/RCA patent.  ? Essential hypertension   ? GI bleed   ? History of blood transfusion   ? was getting 4 units  ? Hyperlipidemia   ? Ischemic cardiomyopathy   ? a.  echo 08/06/11: dist ant wall, apical and septal and infero-apical HK, mild LVH, EF 40%, mild LAE, PASP 34, asc aorta mildly dilated, mild TR;  b.  Echo (3/13) showed recovery of LV systolic function with EF 63-78%, grade I diastolic dysfunction, mild MR.   ? Myocardial infarction [facility])   ? twice with NSTEMI 2012  ? Palpitations   ? Type 2 diabetes mellitus ([place])   ? ? ?Past Surgical History:  ?Procedure Laterality Date  ? ANTERIOR CERVICAL DECOMP/DISCECTOMY FUSION  in the 90s  ? CHOLECYSTECTOMY  03/27/2019  ? COLONOSCOPY  2021  ? COLONOSCOPY WITH ESOPHAGOGASTRODUODENOSCOPY (EGD)  10/2019  ? CORONARY ANGIOPLASTY  12/15/2012; 12/22/2012  ? CORONARY ANGIOPLASTY WITH STENT PLACEMENT  07/2011  ? "1" (12/22/2012)  ? GASTRIC BYPASS  12/21/2017  ? KNEE ARTHROSCOPY Left 12/31/2016  ? LEFT HEART CATHETERIZATION WITH CORONARY ANGIOGRAM N/A 08/06/2011  ? Procedure: LEFT HEART CATHETERIZATION WITH CORONARY ANGIOGRAM;  Surgeon: Burnell Blanks, MD;  Location: [facility] CATH LAB;  Service: Cardiovascular;  Laterality: N/A;  ? LEFT HEART CATHETERIZATION WITH CORONARY ANGIOGRAM N/A 12/15/2012  ? Procedure: LEFT HEART CATHETERIZATION WITH CORONARY ANGIOGRAM;  Surgeon: Sherren Mocha, MD;  Location: [facility] CATH LAB;  Service: Cardiovascular;  Laterality: N/A;  ? LEFT HEART CATHETERIZATION WITH CORONARY ANGIOGRAM N/A 12/22/2012  ? Procedure: LEFT HEART CATHETERIZATION WITH CORONARY ANGIOGRAM;  Surgeon: Sherren Mocha, MD;  Location: [facility] CATH LAB;  Service: Cardiovascular;  Laterality: N/A;  ? PERCUTANEOUS CORONARY STENT INTERVENTION (PCI-S) Right 12/15/2012  ? Procedure: PERCUTANEOUS CORONARY STENT INTERVENTION (PCI-S);  Surgeon: Sherren Mocha, MD;  Location: [facility] CATH LAB;  Service: Cardiovascular;  Laterality: Right;  ?  ? ?Home Medications:  ?Prior to Admission medications    ?Medication Sig Start Date End Date Taking? Authorizing Provider  ?aspirin EC 81 MG tablet Take 81 mg by mouth daily.   Yes [provider]  ?atorvastatin (LIPITOR) 80 MG tablet TAKE ONE TABLET BY MOUTH EVERYDAY AT BEDTIME ?Patient taking differently: Take 80 mg by mouth daily. 09/18/21  Yes Paz, Alda Berthold, MD  ?carvedilol (COREG) 12.5 MG tablet TAKE ONE TABLET BY MOUTH EVERY MORNING and TAKE ONE TABLET BY MOUTH EVERYDAY AT BEDTIME ?Patient taking differently: Take 12.5 mg by mouth 2 (two) times daily with a meal. 09/18/21  Yes Paz, Alda Berthold, MD  ?dapagliflozin propanediol (FARXIGA) 5 MG TABS tablet Take 1 tablet (5 mg total) by mouth daily. 11/18/21  Yes Shamleffer, Melanie Crazier, MD  ?FEROSUL 325 (65 Fe) MG tablet TAKE 1 TABLET BY MOUTH DAILY ?Patient taking differently: Take 325 mg by mouth daily.  06/10/20  Yes Cirigliano, Vito V, DO  ?FLUoxetine (PROZAC) 40 MG capsule Take 1 capsule (40 mg total) by mouth daily. 07/23/21  Yes Paz, Alda Berthold, MD  ?insulin glargine (LANTUS SOLOSTAR) 100 UNIT/ML Solostar Pen Inject 20 Units into the skin daily. ?Patient taking differently: Inject 18 Units into the skin daily. 11/18/21  Yes Shamleffer, Melanie Crazier, MD  ?losartan (COZAAR) 50 MG tablet TAKE ONE TABLET BY MOUTH EVERYDAY AT BEDTIME ?Patient taking differently: Take 50 mg by mouth daily. 09/18/21  Yes Paz, Alda Berthold, MD  ?magnesium oxide (MAG-OX) 400 MG tablet TAKE ONE TABLET BY MOUTH EVERY MORNING and TAKE ONE TABLET BY MOUTH EVERYDAY AT BEDTIME ?Patient taking differently: Take 400 mg by mouth 2 (two) times daily. 09/18/21  Yes Colon Branch, MD  ?metFORMIN (GLUCOPHAGE) 1000 MG tablet Take 1 tablet (1,000 mg total) by mouth 2 (two) times daily with a meal. 11/18/21  Yes Shamleffer, Melanie Crazier, MD  ?nitroGLYCERIN (NITROSTAT) 0.4 MG SL tablet Place 1 tablet (0.4 mg total) under the tongue every 5 (five) minutes as needed for chest pain. 11/03/18  Yes Satira Sark, MD  ?pantoprazole (PROTONIX) 40 MG tablet TAKE ONE  TABLET BY MOUTH EVERY MORNING and TAKE ONE TABLET BY MOUTH EVERYDAY AT BEDTIME ?Patient taking differently: Take 40 mg by mouth 2 (two) times daily. 09/18/21  Yes Colon Branch, MD  ?Potassium 99 MG TABS Take 1 tabl

## 2021-12-06 NOTE — Care Management Obs Status (Signed)
MEDICARE OBSERVATION STATUS NOTIFICATION ? ? ?Patient Details  ?Name: Randy Castro. ?MRN: [id number] ?Date of Birth: [birth year] ? ? ?Medicare Observation Status Notification Given:  Yes ? ? ? ?Iona Beard, LCSWA ?12/06/2021, 10:33 AM ?

## 2021-12-06 NOTE — Progress Notes (Signed)
?  Transition of Care (TOC) Screening Note ? ? ?Patient Details  ?Name: Randy Castro. ?Date of Birth: July 14, 1951 ? ? ?Transition of Care (TOC) CM/SW Contact:    ?Iona Beard, LCSWA ?Phone Number: ?12/06/2021, 10:33 AM ? ? ? ?Transition of Care Department [facility]) has reviewed patient and no TOC needs have been identified at this time. We will continue to monitor patient advancement through interdisciplinary progression rounds. If new patient transition needs arise, please place a TOC consult. ?  ?

## 2021-12-06 NOTE — Progress Notes (Signed)
ANTICOAGULATION CONSULT NOTE - Follow Up Consult ? ?Pharmacy Consult for heparin ?Indication: chest pain/ACS ? ?Allergies  ?Allergen Reactions  ? Trulicity [Dulaglutide] Nausea And Vomiting  ? ? ?Patient Measurements: ?Height: '6\' 2"'$  (188 cm) ?Weight: 97.5 kg (215 lb) ?IBW/kg (Calculated) : 82.2 ? ?Vital Signs: ?Temp: 97.9 ?F (36.6 ?C) (04/14 1915) ?Temp Source: Oral (04/14 1915) ?BP: 166/71 (04/15 0630) ?Pulse Rate: 56 (04/15 0630) ? ?Labs: ?Recent Labs  ?  12/05/21 ?1920 12/05/21 ?2136 12/06/21 ?0355  ?HGB 12.4*  --  12.3*  ?HCT 37.7*  --  37.9*  ?PLT 264  --  238  ?CREATININE 0.67  --   --   ?TROPONINIHS 22* 85* 4,601*  ? ? ?Estimated Creatinine Clearance: 99.9 mL/min (by C-G formula based on SCr of 0.67 mg/dL). ? ? ?Assessment: ?71yo male c/o dull aching CP, troponin elevated and rising (97>41>6384) >> to begin heparin. ? ?Goal of Therapy:  ?Heparin level 0.3-0.7 units/ml ?Monitor platelets by anticoagulation protocol: Yes ?  ?Plan:  ?Heparin 4000 units IV bolus x1 followed by infusion at 1300 units/hr. ?Monitor heparin levels and CBC. ? ?Wynona Neat, PharmD, BCPS  ?12/06/2021,6:43 AM ? ? ?

## 2021-12-06 NOTE — Assessment & Plan Note (Signed)
-   20 units of basal insulin at home ?-Reduce to 10 units while in the hospital ?-Sliding scale coverage ?-Carb modified diet ?-Last hemoglobin A1c 7.8 ?-Continue to monitor CBGs ?

## 2021-12-06 NOTE — Progress Notes (Signed)
ANTICOAGULATION CONSULT NOTE -  Consult ? ?Pharmacy Consult for heparin ?Indication: chest pain/ACS ? ?Allergies  ?Allergen Reactions  ? Trulicity [Dulaglutide] Nausea And Vomiting  ? ? ?Patient Measurements: ?Height: '6\' 2"'$  (188 cm) ?Weight: 97.5 kg (215 lb) ?IBW/kg (Calculated) : 82.2 ? ?Vital Signs: ?Temp: 98.2 ?F (36.8 ?C) (04/15 1304) ?Temp Source: Oral (04/15 1304) ?BP: 157/76 (04/15 1330) ?Pulse Rate: 59 (04/15 1330) ? ?Labs: ?Recent Labs  ?  12/05/21 ?1920 12/05/21 ?2136 12/06/21 ?2878 12/06/21 ?6767 12/06/21 ?1146 12/06/21 ?1308  ?HGB 12.4*  --  12.3*  --   --   --   ?HCT 37.7*  --  37.9*  --   --   --   ?PLT 264  --  238  --   --   --   ?HEPARINUNFRC  --   --   --   --   --  0.11*  ?CREATININE 0.67  --  0.53*  --   --   --   ?TROPONINIHS 22*   < > 4,601* 4,420* 4,513*  --   ? < > = values in this interval not displayed.  ? ? ? ?Estimated Creatinine Clearance: 99.9 mL/min (A) (by C-G formula based on SCr of 0.53 mg/dL (L)). ? ? ?Assessment: ?71yo male c/o dull aching CP, troponin elevated and rising (20>94>7096) >> to begin heparin. ? ?HL 0.11, subtherapeutic  ? ?Goal of Therapy:  ?Heparin level 0.3-0.7 units/ml ?Monitor platelets by anticoagulation protocol: Yes ?  ?Plan:  ?Bolus Heparin 2000 units IV bolus x1 followed by infusion increase heparin iv to 1600 units/hr. ?Monitor heparin levels in 8 hours and CBC. ? ?Thomasenia Sales, PharmD, MBA ?Clinical Pharmacist ? ?12/06/2021,2:25 PM ? ? ?

## 2021-12-06 NOTE — Plan of Care (Signed)

## 2021-12-06 NOTE — Assessment & Plan Note (Signed)
-   Troponin elevation from 20 to --> 85 ?-Currently chest pain-free ?-Cardiology on-call recommends admission to [facility] and stress test in a.m. ?-Care communication to notify cardiology when patient arrives at [facility] ?-EKG is without acute ischemic changes ?-We will cycle troponin again in 2 hours ?-EKG for any further chest pain ?-324 mg aspirin given in the ED ?-Resume home medications for medical optimization with beta-blocker, statin, ARB, aspirin ?-Continue to monitor ?

## 2021-12-06 NOTE — Progress Notes (Addendum)
Pt arrived to [facility]E from [facility] via [facility]. VSS. A&Ox4. CHG bath done. Tele applied and CCMD called. Skin c/d/I. Pt reports HA 4/10, but denies any chest pain. Pt brought home nitroglycerin to hospital. Sent med home with wife. Will continue to monitor. ? ?Raelyn Number, RN ? ?

## 2021-12-06 NOTE — Progress Notes (Signed)
*  PRELIMINARY RESULTS* ?Echocardiogram ?Limited 2-D Echocardiogram  has been performed with Color Doppler. ? ?Randy Castro ?12/06/2021, 11:52 AM ?

## 2021-12-06 NOTE — ED Notes (Signed)
CRITICAL VALUE STICKER ? ?CRITICAL VALUE: Trop [id number] ? ?RECEIVER (on-site recipient of call):I.Shannon Kirkendall ? ?DATE & TIME NOTIFIED: 12/06/2021 ? ?MESSENGER (representative from lab):1036 ? ?MD NOTIFIED: Dyann Kief ? ?TIME OF NOTIFICATION:1037 ? ?RESPONSE:  ? ?

## 2021-12-06 NOTE — ED Notes (Signed)
CRITICAL VALUE STICKER ? ?CRITICAL VALUE:Trop [id number] ? ?RECEIVER (on-site recipient of call):Alyssa, RN ? ?DATE & TIME NOTIFIED: 12/06/21 1233 ? ?MESSENGER (representative from lab):Lab ? ?MD NOTIFIED: Dyann Kief ?1235 ?TIME OF NOTIFICATION: ? ?RESPONSE:   ?

## 2021-12-06 NOTE — Assessment & Plan Note (Signed)
-   Continue Coreg and losartan ?-Continue to monitor ?

## 2021-12-06 NOTE — H&P (Signed)
?History and Physical  ? ? ?Patient: Randy Castro. YBW:[id number] DOB: 1951/06/04 ?DOA: 12/05/2021 ?DOS: the patient was seen and examined on 12/06/2021 ?PCP: Colon Branch, MD  ?Patient coming from: Home ? ?Chief Complaint:  ?Chief Complaint  ?Patient presents with  ? Chest Pain  ? ?HPI: Hayato Guaman. is a 71 y.o. male with medical history significant of coronary artery disease status post 2 stents in 2012 and 2014, hypertension, ischemic cardiomyopathy, type 2 diabetes mellitus, GERD, and more presents the ED with a chief complaint of chest pain.  Patient reports that the pain was gradual in onset while he was doing light exertion.  He describes the pain is moderate and dull.  It is in his left chest and intermittently radiates to his arm.  When he has pain in his arm it also feels like a dull ache.  He denies nausea, vomiting, diaphoresis, dyspnea, palpitations.  He admits to dizziness-but reports that he has had an inner ear ongoing problem.  Patient's only other complaint is that he has felt fatigued more than normal.  He thinks has been going on about a year.  Patient has no other complaints at this time. ? ?Patient does not smoke, does not drink alcohol regularly, does not use illicit drugs.  He is vaccinated for COVID.  Patient is full code. ?Review of Systems: As mentioned in the history of present illness. All other systems reviewed and are negative. ?Past Medical History:  ?Diagnosis Date  ? Anxiety   ? Arthritis   ? BCC (basal cell carcinoma of skin) 12/2020  ? CAD (coronary artery disease)   ? a. NSTEMI 12/12: EF 40-45%. JGO:TLXBWIO balloon PTCA + Promus DES x 1 to mid LAD;  b. 11/2012: Cath with Cutting balloon PTCA  LAD for ISR to LAD, EF 55%;  c. 12/2012 Cath/PCI: LAD stents patent, 80 ost Diag (jailed)->PTCA, LCX/RCA patent.  ? Essential hypertension   ? GI bleed   ? History of blood transfusion   ? was getting 4 units  ? Hyperlipidemia   ? Ischemic cardiomyopathy   ? a.  echo 08/06/11: dist ant  wall, apical and septal and infero-apical HK, mild LVH, EF 40%, mild LAE, PASP 34, asc aorta mildly dilated, mild TR;  b.  Echo (3/13) showed recovery of LV systolic function with EF 03-55%, grade I diastolic dysfunction, mild MR.   ? Myocardial infarction [facility])   ? twice with NSTEMI 2012  ? Palpitations   ? Type 2 diabetes mellitus ([place])   ? ?Past Surgical History:  ?Procedure Laterality Date  ? ANTERIOR CERVICAL DECOMP/DISCECTOMY FUSION  in the 90s  ? CHOLECYSTECTOMY  03/27/2019  ? COLONOSCOPY  2021  ? COLONOSCOPY WITH ESOPHAGOGASTRODUODENOSCOPY (EGD)  10/2019  ? CORONARY ANGIOPLASTY  12/15/2012; 12/22/2012  ? CORONARY ANGIOPLASTY WITH STENT PLACEMENT  07/2011  ? "1" (12/22/2012)  ? GASTRIC BYPASS  12/21/2017  ? KNEE ARTHROSCOPY Left 12/31/2016  ? LEFT HEART CATHETERIZATION WITH CORONARY ANGIOGRAM N/A 08/06/2011  ? Procedure: LEFT HEART CATHETERIZATION WITH CORONARY ANGIOGRAM;  Surgeon: Burnell Blanks, MD;  Location: [facility] CATH LAB;  Service: Cardiovascular;  Laterality: N/A;  ? LEFT HEART CATHETERIZATION WITH CORONARY ANGIOGRAM N/A 12/15/2012  ? Procedure: LEFT HEART CATHETERIZATION WITH CORONARY ANGIOGRAM;  Surgeon: Sherren Mocha, MD;  Location: [facility] CATH LAB;  Service: Cardiovascular;  Laterality: N/A;  ? LEFT HEART CATHETERIZATION WITH CORONARY ANGIOGRAM N/A 12/22/2012  ? Procedure: LEFT HEART CATHETERIZATION WITH CORONARY ANGIOGRAM;  Surgeon: Sherren Mocha, MD;  Location:  [place] CATH LAB;  Service: Cardiovascular;  Laterality: N/A;  ? PERCUTANEOUS CORONARY STENT INTERVENTION (PCI-S) Right 12/15/2012  ? Procedure: PERCUTANEOUS CORONARY STENT INTERVENTION (PCI-S);  Surgeon: Sherren Mocha, MD;  Location: [facility] CATH LAB;  Service: Cardiovascular;  Laterality: Right;  ? ?Social History:  reports that he quit smoking about 29 years ago. His smoking use included cigarettes. He has a 25.00 pack-year smoking history. He has never used smokeless tobacco. He reports current alcohol use. He reports that he does not use  drugs. ? ?Allergies  ?Allergen Reactions  ? Trulicity [Dulaglutide] Nausea And Vomiting  ? ? ?Family History  ?Problem Relation Age of Onset  ? Diabetes Brother   ? Lung cancer Brother   ? Diabetes Mother   ? Coronary artery disease Father   ? Heart attack Father 44  ? Diabetes Sister   ? Coronary artery disease Sister   ? Colon cancer Neg Hx   ? Prostate cancer Neg Hx   ? Stroke Neg Hx   ? Esophageal cancer Neg Hx   ? Colon polyps Neg Hx   ? Rectal cancer Neg Hx   ? Stomach cancer Neg Hx   ? ? ?Prior to Admission medications   ?Medication Sig Start Date End Date Taking? Authorizing Provider  ?aspirin EC 81 MG tablet Take 81 mg by mouth daily.    [provider]  ?atorvastatin (LIPITOR) 80 MG tablet TAKE ONE TABLET BY MOUTH EVERYDAY AT BEDTIME 09/18/21   Colon Branch, MD  ?Blood Glucose Monitoring Suppl (ONE TOUCH ULTRA 2) w/Device KIT by Does not apply route.    [provider]  ?carvedilol (COREG) 12.5 MG tablet TAKE ONE TABLET BY MOUTH EVERY MORNING and TAKE ONE TABLET BY MOUTH EVERYDAY AT BEDTIME 09/18/21   Colon Branch, MD  ?Continuous Blood Gluc Sensor (FREESTYLE LIBRE 2 SENSOR) MISC 1 Device by Does not apply route every 14 (fourteen) days. 11/18/21   Shamleffer, Melanie Crazier, MD  ?dapagliflozin propanediol (FARXIGA) 5 MG TABS tablet Take 1 tablet (5 mg total) by mouth daily. 11/18/21   Shamleffer, Melanie Crazier, MD  ?FEROSUL 325 (65 Fe) MG tablet TAKE 1 TABLET BY MOUTH DAILY 06/10/20   Cirigliano, Vito V, DO  ?FLUoxetine (PROZAC) 40 MG capsule Take 1 capsule (40 mg total) by mouth daily. 07/23/21   Colon Branch, MD  ?glucose blood (ONETOUCH ULTRA) test strip Check blood sugar 2-3 times daily.  Dx Code: E11.9 05/27/20   Colon Branch, MD  ?insulin glargine (LANTUS SOLOSTAR) 100 UNIT/ML Solostar Pen Inject 20 Units into the skin daily. 11/18/21   Shamleffer, Melanie Crazier, MD  ?losartan (COZAAR) 50 MG tablet TAKE ONE TABLET BY MOUTH EVERYDAY AT BEDTIME 09/18/21   Colon Branch, MD  ?magnesium oxide  (MAG-OX) 400 MG tablet TAKE ONE TABLET BY MOUTH EVERY MORNING and TAKE ONE TABLET BY MOUTH EVERYDAY AT BEDTIME 09/18/21   Colon Branch, MD  ?metFORMIN (GLUCOPHAGE) 1000 MG tablet Take 1 tablet (1,000 mg total) by mouth 2 (two) times daily with a meal. 11/18/21   Shamleffer, Melanie Crazier, MD  ?nitroGLYCERIN (NITROSTAT) 0.4 MG SL tablet Place 1 tablet (0.4 mg total) under the tongue every 5 (five) minutes as needed for chest pain. 11/03/18   Satira Sark, MD  ?pantoprazole (PROTONIX) 40 MG tablet TAKE ONE TABLET BY MOUTH EVERY MORNING and TAKE ONE TABLET BY MOUTH EVERYDAY AT BEDTIME 09/18/21   Colon Branch, MD  ?Potassium 99 MG TABS Take 1 tablet by mouth  daily.    [provider]  ?primidone (MYSOLINE) 50 MG tablet TAKE THREE TABLETS BY MOUTH EVERY MORNING and TAKE TWO TABLETS BY MOUTH EVERYDAY AT BEDTIME 12/03/21   Narda Amber K, DO  ?vitamin B-12 (CYANOCOBALAMIN) 1000 MCG tablet Take 1,000 mcg by mouth daily.    [provider]  ? ? ?Physical Exam: ?Vitals:  ? 12/05/21 2321 12/05/21 2330 12/05/21 2345 12/06/21 0000  ?BP:    (!) 165/60  ?Pulse:  (!) 57 (!) 58 (!) 56  ?Resp: _0 ?Temp:      ?TempSrc:      ?SpO2:  97% 95% 94%  ?Weight:      ?Height:      ? ?1.  General: ?Patient lying supine in bed,  no acute distress ?  ?2. Psychiatric: ?Alert and oriented x 3, mood and behavior normal for situation, pleasant and cooperative with exam ?  ?3. Neurologic: ?Speech and language are normal, face is symmetric, moves all 4 extremities voluntarily, at baseline without acute deficits on limited exam ?  ?4. HEENMT:  ?Head is atraumatic, normocephalic, pupils reactive to light, neck is supple, trachea is midline, mucous membranes are moist ?  ?5. Respiratory : ?Lungs are clear to auscultation bilaterally without wheezing, rhonchi, rales, no cyanosis, no increase in work of breathing or accessory muscle use ?  ?6. Cardiovascular : ?Heart rate normal, rhythm is regular, no murmurs, rubs or gallops,  no peripheral edema, peripheral pulses palpated ?  ?7. Gastrointestinal:  ?Abdomen is soft, nondistended, nontender to palpation bowel sounds active, no masses or organomegaly palpated ?  ?8. Skin:  ?Skin is warm,

## 2021-12-06 NOTE — Assessment & Plan Note (Signed)
-   Continue statin medication ?-Continue to monitor ?

## 2021-12-06 NOTE — Assessment & Plan Note (Signed)
-   Continue Protonix - Continue to monitor  

## 2021-12-07 DIAGNOSIS — I214 Non-ST elevation (NSTEMI) myocardial infarction: Secondary | ICD-10-CM | POA: Diagnosis not present

## 2021-12-07 DIAGNOSIS — Z85828 Personal history of other malignant neoplasm of skin: Secondary | ICD-10-CM | POA: Diagnosis not present

## 2021-12-07 DIAGNOSIS — M1990 Unspecified osteoarthritis, unspecified site: Secondary | ICD-10-CM | POA: Diagnosis not present

## 2021-12-07 DIAGNOSIS — Z7984 Long term (current) use of oral hypoglycemic drugs: Secondary | ICD-10-CM | POA: Diagnosis not present

## 2021-12-07 DIAGNOSIS — I25110 Atherosclerotic heart disease of native coronary artery with unstable angina pectoris: Secondary | ICD-10-CM | POA: Diagnosis not present

## 2021-12-07 DIAGNOSIS — Z7982 Long term (current) use of aspirin: Secondary | ICD-10-CM | POA: Diagnosis not present

## 2021-12-07 DIAGNOSIS — K219 Gastro-esophageal reflux disease without esophagitis: Secondary | ICD-10-CM | POA: Diagnosis not present

## 2021-12-07 DIAGNOSIS — Z794 Long term (current) use of insulin: Secondary | ICD-10-CM | POA: Diagnosis not present

## 2021-12-07 DIAGNOSIS — I252 Old myocardial infarction: Secondary | ICD-10-CM | POA: Diagnosis not present

## 2021-12-07 DIAGNOSIS — R778 Other specified abnormalities of plasma proteins: Secondary | ICD-10-CM | POA: Diagnosis present

## 2021-12-07 DIAGNOSIS — Z981 Arthrodesis status: Secondary | ICD-10-CM | POA: Diagnosis not present

## 2021-12-07 DIAGNOSIS — F419 Anxiety disorder, unspecified: Secondary | ICD-10-CM | POA: Diagnosis present

## 2021-12-07 DIAGNOSIS — I77810 Thoracic aortic ectasia: Secondary | ICD-10-CM | POA: Diagnosis present

## 2021-12-07 DIAGNOSIS — Z8249 Family history of ischemic heart disease and other diseases of the circulatory system: Secondary | ICD-10-CM | POA: Diagnosis not present

## 2021-12-07 DIAGNOSIS — E1165 Type 2 diabetes mellitus with hyperglycemia: Secondary | ICD-10-CM | POA: Diagnosis not present

## 2021-12-07 DIAGNOSIS — I10 Essential (primary) hypertension: Secondary | ICD-10-CM | POA: Diagnosis not present

## 2021-12-07 DIAGNOSIS — E785 Hyperlipidemia, unspecified: Secondary | ICD-10-CM | POA: Diagnosis not present

## 2021-12-07 DIAGNOSIS — I255 Ischemic cardiomyopathy: Secondary | ICD-10-CM | POA: Diagnosis present

## 2021-12-07 DIAGNOSIS — Z888 Allergy status to other drugs, medicaments and biological substances status: Secondary | ICD-10-CM | POA: Diagnosis not present

## 2021-12-07 DIAGNOSIS — R42 Dizziness and giddiness: Secondary | ICD-10-CM | POA: Diagnosis present

## 2021-12-07 DIAGNOSIS — I2510 Atherosclerotic heart disease of native coronary artery without angina pectoris: Secondary | ICD-10-CM | POA: Diagnosis not present

## 2021-12-07 DIAGNOSIS — I25118 Atherosclerotic heart disease of native coronary artery with other forms of angina pectoris: Secondary | ICD-10-CM | POA: Diagnosis not present

## 2021-12-07 DIAGNOSIS — Z955 Presence of coronary angioplasty implant and graft: Secondary | ICD-10-CM | POA: Diagnosis not present

## 2021-12-07 DIAGNOSIS — E7800 Pure hypercholesterolemia, unspecified: Secondary | ICD-10-CM | POA: Diagnosis not present

## 2021-12-07 DIAGNOSIS — I447 Left bundle-branch block, unspecified: Secondary | ICD-10-CM | POA: Diagnosis present

## 2021-12-07 DIAGNOSIS — Z9049 Acquired absence of other specified parts of digestive tract: Secondary | ICD-10-CM | POA: Diagnosis not present

## 2021-12-07 DIAGNOSIS — Z79899 Other long term (current) drug therapy: Secondary | ICD-10-CM | POA: Diagnosis not present

## 2021-12-07 DIAGNOSIS — J302 Other seasonal allergic rhinitis: Secondary | ICD-10-CM | POA: Diagnosis present

## 2021-12-07 LAB — CBC
HCT: 35.8 % — ABNORMAL LOW (ref 39.0–52.0)
Hemoglobin: 11.8 g/dL — ABNORMAL LOW (ref 13.0–17.0)
MCH: 28.3 pg (ref 26.0–34.0)
MCHC: 33 g/dL (ref 30.0–36.0)
MCV: 85.9 fL (ref 80.0–100.0)
Platelets: 225 10*3/uL (ref 150–400)
RBC: 4.17 MIL/uL — ABNORMAL LOW (ref 4.22–5.81)
RDW: 13.8 % (ref 11.5–15.5)
WBC: 6.4 10*3/uL (ref 4.0–10.5)
nRBC: 0 % (ref 0.0–0.2)

## 2021-12-07 LAB — COMPREHENSIVE METABOLIC PANEL
ALT: 32 U/L (ref 0–44)
AST: 30 U/L (ref 15–41)
Albumin: 3 g/dL — ABNORMAL LOW (ref 3.5–5.0)
Alkaline Phosphatase: 53 U/L (ref 38–126)
Anion gap: 6 (ref 5–15)
BUN: 8 mg/dL (ref 8–23)
CO2: 26 mmol/L (ref 22–32)
Calcium: 8.4 mg/dL — ABNORMAL LOW (ref 8.9–10.3)
Chloride: 103 mmol/L (ref 98–111)
Creatinine, Ser: 0.69 mg/dL (ref 0.61–1.24)
GFR, Estimated: >60 mL/min (ref >60)
Glucose, Bld: 183 mg/dL — ABNORMAL HIGH (ref 70–99)
Potassium: 3.5 mmol/L (ref 3.5–5.1)
Sodium: 135 mmol/L (ref 135–145)
Total Bilirubin: 0.6 mg/dL (ref 0.3–1.2)
Total Protein: 5.3 g/dL — ABNORMAL LOW (ref 6.5–8.1)

## 2021-12-07 LAB — GLUCOSE, CAPILLARY
Glucose-Capillary: 250 mg/dL — ABNORMAL HIGH (ref 70–99)
Glucose-Capillary: 209 mg/dL — ABNORMAL HIGH (ref 70–99)
Glucose-Capillary: 252 mg/dL — ABNORMAL HIGH (ref 70–99)
Glucose-Capillary: 160 mg/dL — ABNORMAL HIGH (ref 70–99)
Glucose-Capillary: 168 mg/dL — ABNORMAL HIGH (ref 70–99)

## 2021-12-07 LAB — HEPARIN LEVEL (UNFRACTIONATED)
Heparin Unfractionated: 0.43 IU/mL (ref 0.30–0.70)
Heparin Unfractionated: 0.54 IU/mL (ref 0.30–0.70)

## 2021-12-07 LAB — MAGNESIUM: Magnesium: 1.7 mg/dL (ref 1.7–2.4)

## 2021-12-07 MED ORDER — ACETAMINOPHEN 500 MG PO TABS
1000.0000 mg | ORAL_TABLET | Freq: Once | ORAL | Status: AC
  Administered 2021-12-07: 1000 mg via ORAL
  Filled 2021-12-07: qty 2

## 2021-12-07 MED ORDER — SODIUM CHLORIDE 0.9% FLUSH
3.0000 mL | Freq: Two times a day (BID) | INTRAVENOUS | Status: DC
  Administered 2021-12-07 – 2021-12-08 (×2): 3 mL via INTRAVENOUS

## 2021-12-07 MED ORDER — SODIUM CHLORIDE 0.9% FLUSH: 3.0000 mL | INTRAVENOUS | Status: DC | PRN

## 2021-12-07 MED ORDER — DAPAGLIFLOZIN PROPANEDIOL 10 MG PO TABS
10.0000 mg | ORAL_TABLET | Freq: Every day | ORAL | Status: DC
  Administered 2021-12-07 – 2021-12-09 (×2): 10 mg via ORAL
  Filled 2021-12-07 (×3): qty 1

## 2021-12-07 MED ORDER — SODIUM CHLORIDE 0.9 % IV SOLN: INTRAVENOUS | Status: DC

## 2021-12-07 MED ORDER — SODIUM CHLORIDE 0.9 % IV SOLN: 250.0000 mL | INTRAVENOUS | Status: DC | PRN

## 2021-12-07 NOTE — Progress Notes (Signed)
ANTICOAGULATION CONSULT NOTE -  Consult ? ?Pharmacy Consult for heparin ?Indication: chest pain/ACS ? ?Allergies  ?Allergen Reactions  ? Trulicity [Dulaglutide] Nausea And Vomiting  ? ? ?Patient Measurements: ?Height: '6\' 2"'$  (188 cm) ?Weight: 98.1 kg (216 lb 4.3 oz) ?IBW/kg (Calculated) : 82.2 ? ?Vital Signs: ?Temp: 97.6 ?F (36.4 ?C) (04/16 0802) ?Temp Source: Oral (04/16 0802) ?BP: 146/67 (04/16 0802) ?Pulse Rate: 60 (04/16 0802) ? ?Labs: ?Recent Labs  ?  12/05/21 ?1920 12/05/21 ?2136 12/06/21 ?9935 12/06/21 ?7017 12/06/21 ?1146 12/06/21 ?1308 12/06/21 ?1812 12/07/21 ?0040 12/07/21 ?0747  ?HGB 12.4*  --  12.3*  --   --   --   --  11.8*  --   ?HCT 37.7*  --  37.9*  --   --   --   --  35.8*  --   ?PLT 264  --  238  --   --   --   --  225  --   ?HEPARINUNFRC  --   --   --   --   --  0.11*  --  0.43 0.54  ?CREATININE 0.67  --  0.53*  --   --   --   --  0.69  --   ?TROPONINIHS 22*   < > 4,601* 4,420* 4,513*  --  3,053*  --   --   ? < > = values in this interval not displayed.  ? ? ? ?Estimated Creatinine Clearance: 99.9 mL/min (by C-G formula based on SCr of 0.69 mg/dL). ? ? ?Assessment: ?71yo male c/o dull aching CP, troponin elevated and rising (79>39>0300) >> to begin heparin. ? ?HL 0.54, therapeutic. CBC stable. Per RN there have not been any issues with the infusion or s/sx of bleeding.  ? ?Goal of Therapy:  ?Heparin level 0.3-0.7 units/ml ?Monitor platelets by anticoagulation protocol: Yes ?  ?Plan:  ?Continue heparin infusion at 1600 units/hr. ?Daily HL and CBC. ?Monitor for s/sx of bleeding ? ?Marzella Schlein [place], Student Pharmacist  ? ?12/07/2021,8:43 AM ? ? ?

## 2021-12-07 NOTE — H&P (View-Only) (Signed)
? ?Progress Note ? ?Patient Name: Randy Castro. ?Date of Encounter: 12/07/2021 ? ?[facility] Cardiologist: Rozann Lesches, MD  ? ?Subjective  ? ?No significant chest pain, some headache from NG drip ? ?Inpatient Medications  ?  ?Scheduled Meds: ? aspirin EC  81 mg Oral Daily  ? atorvastatin  80 mg Oral Daily  ? carvedilol  12.5 mg Oral BID WC  ? FLUoxetine  40 mg Oral Daily  ? insulin aspart  0-15 Units Subcutaneous TID WC  ? insulin aspart  0-5 Units Subcutaneous QHS  ? insulin detemir  10 Units Subcutaneous QHS  ? losartan  50 mg Oral Daily  ? pantoprazole  40 mg Oral Daily  ? primidone  50 mg Oral BID  ? ?Continuous Infusions: ? heparin 1,600 Units/hr (12/06/21 2047)  ? nitroGLYCERIN 15 mcg/min (12/06/21 2047)  ? ?PRN Meds: ?acetaminophen, melatonin, ondansetron (ZOFRAN) IV, oxyCODONE  ? ?Vital Signs  ?  ?Vitals:  ? 12/07/21 0500 12/07/21 0600 12/07/21 0700 12/07/21 0802  ?BP: 123/66 128/66  (!) 146/67  ?Pulse: (!) 50 (!) 54  60  ?Resp: '19 15 11 19  '$ ?Temp:    97.6 ?F (36.4 ?C)  ?TempSrc:    Oral  ?SpO2: 94% 96%  96%  ?Weight:      ?Height:      ? ? ?Intake/Output Summary (Last 24 hours) at 12/07/2021 0835 ?Last data filed at 12/06/2021 2047 ?Gross per 24 hour  ?Intake 501.75 ml  ?Output --  ?Net 501.75 ml  ? ? ?  12/07/2021  ?  3:00 AM 12/05/2021  ?  7:16 PM 11/27/2021  ?  8:45 AM  ?Last 3 Weights  ?Weight (lbs) 216 lb 4.3 oz 215 lb 217 lb 12.8 oz  ?Weight (kg) 98.1 kg 97.523 kg 98.793 kg  ?   ? ?Telemetry  ?  ?SR - Personally Reviewed ? ?ECG  ?  ?N/a - Personally Reviewed ? ?Physical Exam  ? ?GEN: No acute distress.   ?Neck: No JVD ?Cardiac: RRR, no murmurs, rubs, or gallops.  ?Respiratory: Clear to auscultation bilaterally. ?GI: Soft, nontender, non-distended  ?MS: No edema; No deformity. ?Neuro:  Nonfocal  ?Psych: Normal affect  ? ?Labs  ?  ?High Sensitivity Troponin:   ?Recent Labs  ?Lab 12/05/21 ?2136 12/06/21 ?6283 12/06/21 ?1517 12/06/21 ?1146 12/06/21 ?1812  ?TROPONINIHS 85* 4,601* 4,420* 4,513* 3,053*   ?   ?Chemistry ?Recent Labs  ?Lab 12/05/21 ?1920 12/06/21 ?6160 12/07/21 ?0040  ?NA 138 139 135  ?K 3.5 3.7 3.5  ?CL 105 104 103  ?CO2 '26 30 26  '$ ?GLUCOSE 163* 155* 183*  ?BUN '16 11 8  '$ ?CREATININE 0.67 0.53* 0.69  ?CALCIUM 9.0 8.9 8.4*  ?MG  --  1.8 1.7  ?PROT  --  6.3* 5.3*  ?ALBUMIN  --  3.6 3.0*  ?AST  --  43* 30  ?ALT  --  42 32  ?ALKPHOS  --  61 53  ?BILITOT  --  0.4 0.6  ?GFRNONAA >60 >60 >60  ?ANIONGAP '7 5 6  '$ ?  ?Lipids No results for input(s): CHOL, TRIG, HDL, LABVLDL, LDLCALC, CHOLHDL in the last 168 hours.  ?Hematology ?Recent Labs  ?Lab 12/05/21 ?1920 12/06/21 ?7371 12/07/21 ?0040  ?WBC 6.3 6.4 6.4  ?RBC 4.39 4.36 4.17*  ?HGB 12.4* 12.3* 11.8*  ?HCT 37.7* 37.9* 35.8*  ?MCV 85.9 86.9 85.9  ?MCH 28.2 28.2 28.3  ?MCHC 32.9 32.5 33.0  ?RDW 13.7 13.9 13.8  ?PLT 264 238 225  ? ?Thyroid  No results for input(s): TSH, FREET4 in the last 168 hours.  ?BNPNo results for input(s): BNP, PROBNP in the last 168 hours.  ?DDimer No results for input(s): DDIMER in the last 168 hours.  ? ?Radiology  ?  ?DG Chest 2 View ? ?Result Date: 12/05/2021 ?CLINICAL DATA:  Chest pain EXAM: CHEST - 2 VIEW COMPARISON:  Two-view chest x-ray 11/14/2017 FINDINGS: Heart size is normal. Mild interstitial coarsening is stable. No focal airspace disease present. No edema or effusion is present. IMPRESSION: No acute cardiopulmonary disease or significant interval change. Electronically Signed   By: San Morelle M.D.   On: 12/05/2021 19:32  ? ?ECHOCARDIOGRAM LIMITED ? ?Result Date: 12/06/2021 ?   ECHOCARDIOGRAM LIMITED REPORT   Patient Name:   Randy Castro. Date of Exam: 12/06/2021 Medical Rec #:  [id number]         Height:       74.0 in Accession #:    [id number]        Weight:       215.0 lb Date of Birth:  March 05, 1951          BSA:          2.241 m? Patient Age:    71 years          BP:           148/75 mmHg Patient Gender: M                 HR:           57 bpm. Exam Location:  [facility] Procedure: Limited Echo and Color Doppler  Indications:    I21.4 NSTEMI  History:        Patient has prior history of Echocardiogram examinations, most                 recent 11/26/2021. Cardiomyopathy, Previous Myocardial Infarction                 and CAD; Risk Factors:Hypertension, Diabetes and Dyslipidemia.  Sonographer:    Alvino Chapel RCS Referring Phys: [place]  1. Left ventricular ejection fraction, by estimation, is 35 to 40%. The left ventricle has moderately decreased function. The left ventricle demonstrates global hypokinesis. There is mild left ventricular hypertrophy. The average left ventricular global  longitudinal strain is -10.1 %. The global longitudinal strain is abnormal.  2. Right ventricular systolic function is normal. The right ventricular size is normal. Tricuspid regurgitation signal is inadequate for assessing PA pressure.  3. Left atrial size was mildly dilated.  4. The mitral valve is grossly normal. Mild mitral valve regurgitation.  5. The aortic valve is tricuspid. There is mild thickening of the aortic valve.  6. Aortic dilatation noted. 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  7. The inferior vena cava is normal in size with greater than 50% respiratory variability, suggesting right atrial pressure of 3 mmHg. Comparison(s): A prior study was performed on 11/26/21. Prior images reviewed side by side. Compared to prior study, LVEF appears globally reduced. FINDINGS  Left Ventricle: Left ventricular ejection fraction, by estimation, is 35 to 40%. The left ventricle has moderately decreased function. The left ventricle demonstrates global hypokinesis. The average left ventricular global longitudinal strain is -10.1 %. The global longitudinal strain is abnormal. There is mild left ventricular hypertrophy. Right Ventricle: The right ventricular size is normal. No increase in right ventricular wall thickness. Right ventricular systolic function is normal. Tricuspid  regurgitation signal  is inadequate for assessing PA pressure. Left Atrium: Left atrial size was mildly dilated. Mitral Valve: The mitral valve is grossly normal. Mild mitral valve regurgitation. Tricuspid Valve: Tricuspid valve regurgitation is trivial. Aortic Valve: The aortic valve is tricuspid. There is mild thickening of the aortic valve. Aorta: Aortic dilatation noted. 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 Venous: The inferior vena cava is normal in size with greater than 50% respiratory variability, suggesting right atrial pressure of 3 mmHg. LEFT VENTRICLE PLAX 2D LVIDd:         6.00 cm LVIDs:         5.20 cm      2D Longitudinal Strain LV PW:         1.20 cm      2D Strain GLS Avg:     -10.1 % LV IVS:        1.20 cm LVOT diam:     2.50 cm LVOT Area:     4.91 cm?  LV Volumes (MOD) LV vol d, MOD A2C: 170.0 ml LV vol d, MOD A4C: 162.0 ml LV vol s, MOD A2C: 114.0 ml LV vol s, MOD A4C: 108.0 ml LV SV MOD A2C:     56.0 ml LV SV MOD A4C:     162.0 ml LV SV MOD BP:      48.2 ml LEFT ATRIUM         Index LA diam:    4.80 cm 2.14 cm/m?   AORTA Ao Root diam: 4.70 cm  SHUNTS Systemic Diam: 2.50 cm Cherlynn Kaiser MD Electronically signed by Cherlynn Kaiser MD Signature Date/Time: 12/06/2021/2:14:01 PM    Final    ? ?Cardiac Studies  ? ? ? ?Patient Profile  ?   ?Evens Meno. is a 71 y.o. male with a history of CAD s/p PTCA/DES to mid LAD in 07/2011 with repeat PTCA in 11/2012 for in-stent restenosis and then PTCA of jailed ostial diagonal in 12/2012, ischemic cardiomyopathy with EF as low as 40% in 2012 improved to 45 to 50% on last on 11/26/2021, hyperlipidemia, type 2 diabetes, prior GI bleed quiring transfusions, and anxiety who is being seen 12/06/2021 for the evaluation of chest pain at the request of Dr. Erlinda Hong. ? ?Assessment & Plan  ?  ?1.CAD/NSTEMI/ICM ?- PTCA/DES to mid LAD in 07/2011 with repeat PTCA in 11/2012 for in-stent restenosis and then PTCA of jailed ostial diagonal in 12/2012 ?- presents with chest  pain, peak trop 4600 declining. EKG chronic LBBB ?- limited echo LVEF 35-40% this admit. 11/26/2021 outpatient echo LVEF 45-50% ? ?- medical therpay with ASA 81, atorva 80, core 12.'5mg'$  bid, hep gtt, losartan 50

## 2021-12-07 NOTE — Progress Notes (Signed)
? ?Progress Note ? ?Patient Name: Randy Castro. ?Date of Encounter: 12/07/2021 ? ?[facility] Cardiologist: Rozann Lesches, MD  ? ?Subjective  ? ?No significant chest pain, some headache from NG drip ? ?Inpatient Medications  ?  ?Scheduled Meds: ? aspirin EC  81 mg Oral Daily  ? atorvastatin  80 mg Oral Daily  ? carvedilol  12.5 mg Oral BID WC  ? FLUoxetine  40 mg Oral Daily  ? insulin aspart  0-15 Units Subcutaneous TID WC  ? insulin aspart  0-5 Units Subcutaneous QHS  ? insulin detemir  10 Units Subcutaneous QHS  ? losartan  50 mg Oral Daily  ? pantoprazole  40 mg Oral Daily  ? primidone  50 mg Oral BID  ? ?Continuous Infusions: ? heparin 1,600 Units/hr (12/06/21 2047)  ? nitroGLYCERIN 15 mcg/min (12/06/21 2047)  ? ?PRN Meds: ?acetaminophen, melatonin, ondansetron (ZOFRAN) IV, oxyCODONE  ? ?Vital Signs  ?  ?Vitals:  ? 12/07/21 0500 12/07/21 0600 12/07/21 0700 12/07/21 0802  ?BP: 123/66 128/66  (!) 146/67  ?Pulse: (!) 50 (!) 54  60  ?Resp: '19 15 11 19  '$ ?Temp:    97.6 ?F (36.4 ?C)  ?TempSrc:    Oral  ?SpO2: 94% 96%  96%  ?Weight:      ?Height:      ? ? ?Intake/Output Summary (Last 24 hours) at 12/07/2021 0835 ?Last data filed at 12/06/2021 2047 ?Gross per 24 hour  ?Intake 501.75 ml  ?Output --  ?Net 501.75 ml  ? ? ?  12/07/2021  ?  3:00 AM 12/05/2021  ?  7:16 PM 11/27/2021  ?  8:45 AM  ?Last 3 Weights  ?Weight (lbs) 216 lb 4.3 oz 215 lb 217 lb 12.8 oz  ?Weight (kg) 98.1 kg 97.523 kg 98.793 kg  ?   ? ?Telemetry  ?  ?SR - Personally Reviewed ? ?ECG  ?  ?N/a - Personally Reviewed ? ?Physical Exam  ? ?GEN: No acute distress.   ?Neck: No JVD ?Cardiac: RRR, no murmurs, rubs, or gallops.  ?Respiratory: Clear to auscultation bilaterally. ?GI: Soft, nontender, non-distended  ?MS: No edema; No deformity. ?Neuro:  Nonfocal  ?Psych: Normal affect  ? ?Labs  ?  ?High Sensitivity Troponin:   ?Recent Labs  ?Lab 12/05/21 ?2136 12/06/21 ?7902 12/06/21 ?4097 12/06/21 ?1146 12/06/21 ?1812  ?TROPONINIHS 85* 4,601* 4,420* 4,513* 3,053*   ?   ?Chemistry ?Recent Labs  ?Lab 12/05/21 ?1920 12/06/21 ?3532 12/07/21 ?0040  ?NA 138 139 135  ?K 3.5 3.7 3.5  ?CL 105 104 103  ?CO2 '26 30 26  '$ ?GLUCOSE 163* 155* 183*  ?BUN '16 11 8  '$ ?CREATININE 0.67 0.53* 0.69  ?CALCIUM 9.0 8.9 8.4*  ?MG  --  1.8 1.7  ?PROT  --  6.3* 5.3*  ?ALBUMIN  --  3.6 3.0*  ?AST  --  43* 30  ?ALT  --  42 32  ?ALKPHOS  --  61 53  ?BILITOT  --  0.4 0.6  ?GFRNONAA >60 >60 >60  ?ANIONGAP '7 5 6  '$ ?  ?Lipids No results for input(s): CHOL, TRIG, HDL, LABVLDL, LDLCALC, CHOLHDL in the last 168 hours.  ?Hematology ?Recent Labs  ?Lab 12/05/21 ?1920 12/06/21 ?9924 12/07/21 ?0040  ?WBC 6.3 6.4 6.4  ?RBC 4.39 4.36 4.17*  ?HGB 12.4* 12.3* 11.8*  ?HCT 37.7* 37.9* 35.8*  ?MCV 85.9 86.9 85.9  ?MCH 28.2 28.2 28.3  ?MCHC 32.9 32.5 33.0  ?RDW 13.7 13.9 13.8  ?PLT 264 238 225  ? ?Thyroid  No results for input(s): TSH, FREET4 in the last 168 hours.  ?BNPNo results for input(s): BNP, PROBNP in the last 168 hours.  ?DDimer No results for input(s): DDIMER in the last 168 hours.  ? ?Radiology  ?  ?DG Chest 2 View ? ?Result Date: 12/05/2021 ?CLINICAL DATA:  Chest pain EXAM: CHEST - 2 VIEW COMPARISON:  Two-view chest x-ray 11/14/2017 FINDINGS: Heart size is normal. Mild interstitial coarsening is stable. No focal airspace disease present. No edema or effusion is present. IMPRESSION: No acute cardiopulmonary disease or significant interval change. Electronically Signed   By: San Morelle M.D.   On: 12/05/2021 19:32  ? ?ECHOCARDIOGRAM LIMITED ? ?Result Date: 12/06/2021 ?   ECHOCARDIOGRAM LIMITED REPORT   Patient Name:   Randy Castro. Date of Exam: 12/06/2021 Medical Rec #:  [id number]         Height:       74.0 in Accession #:    [id number]        Weight:       215.0 lb Date of Birth:  Jan 26, 1951          BSA:          2.241 m? Patient Age:    54 years          BP:           148/75 mmHg Patient Gender: M                 HR:           57 bpm. Exam Location:  [facility] Procedure: Limited Echo and Color Doppler  Indications:    I21.4 NSTEMI  History:        Patient has prior history of Echocardiogram examinations, most                 recent 11/26/2021. Cardiomyopathy, Previous Myocardial Infarction                 and CAD; Risk Factors:Hypertension, Diabetes and Dyslipidemia.  Sonographer:    Alvino Chapel RCS Referring Phys: [place]  1. Left ventricular ejection fraction, by estimation, is 35 to 40%. The left ventricle has moderately decreased function. The left ventricle demonstrates global hypokinesis. There is mild left ventricular hypertrophy. The average left ventricular global  longitudinal strain is -10.1 %. The global longitudinal strain is abnormal.  2. Right ventricular systolic function is normal. The right ventricular size is normal. Tricuspid regurgitation signal is inadequate for assessing PA pressure.  3. Left atrial size was mildly dilated.  4. The mitral valve is grossly normal. Mild mitral valve regurgitation.  5. The aortic valve is tricuspid. There is mild thickening of the aortic valve.  6. Aortic dilatation noted. 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  7. The inferior vena cava is normal in size with greater than 50% respiratory variability, suggesting right atrial pressure of 3 mmHg. Comparison(s): A prior study was performed on 11/26/21. Prior images reviewed side by side. Compared to prior study, LVEF appears globally reduced. FINDINGS  Left Ventricle: Left ventricular ejection fraction, by estimation, is 35 to 40%. The left ventricle has moderately decreased function. The left ventricle demonstrates global hypokinesis. The average left ventricular global longitudinal strain is -10.1 %. The global longitudinal strain is abnormal. There is mild left ventricular hypertrophy. Right Ventricle: The right ventricular size is normal. No increase in right ventricular wall thickness. Right ventricular systolic function is normal. Tricuspid  regurgitation signal  is inadequate for assessing PA pressure. Left Atrium: Left atrial size was mildly dilated. Mitral Valve: The mitral valve is grossly normal. Mild mitral valve regurgitation. Tricuspid Valve: Tricuspid valve regurgitation is trivial. Aortic Valve: The aortic valve is tricuspid. There is mild thickening of the aortic valve. Aorta: Aortic dilatation noted. 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 Venous: The inferior vena cava is normal in size with greater than 50% respiratory variability, suggesting right atrial pressure of 3 mmHg. LEFT VENTRICLE PLAX 2D LVIDd:         6.00 cm LVIDs:         5.20 cm      2D Longitudinal Strain LV PW:         1.20 cm      2D Strain GLS Avg:     -10.1 % LV IVS:        1.20 cm LVOT diam:     2.50 cm LVOT Area:     4.91 cm?  LV Volumes (MOD) LV vol d, MOD A2C: 170.0 ml LV vol d, MOD A4C: 162.0 ml LV vol s, MOD A2C: 114.0 ml LV vol s, MOD A4C: 108.0 ml LV SV MOD A2C:     56.0 ml LV SV MOD A4C:     162.0 ml LV SV MOD BP:      48.2 ml LEFT ATRIUM         Index LA diam:    4.80 cm 2.14 cm/m?   AORTA Ao Root diam: 4.70 cm  SHUNTS Systemic Diam: 2.50 cm Cherlynn Kaiser MD Electronically signed by Cherlynn Kaiser MD Signature Date/Time: 12/06/2021/2:14:01 PM    Final    ? ?Cardiac Studies  ? ? ? ?Patient Profile  ?   ?Raoul Ciano. is a 71 y.o. male with a history of CAD s/p PTCA/DES to mid LAD in 07/2011 with repeat PTCA in 11/2012 for in-stent restenosis and then PTCA of jailed ostial diagonal in 12/2012, ischemic cardiomyopathy with EF as low as 40% in 2012 improved to 45 to 50% on last on 11/26/2021, hyperlipidemia, type 2 diabetes, prior GI bleed quiring transfusions, and anxiety who is being seen 12/06/2021 for the evaluation of chest pain at the request of Dr. Erlinda Hong. ? ?Assessment & Plan  ?  ?1.CAD/NSTEMI/ICM ?- PTCA/DES to mid LAD in 07/2011 with repeat PTCA in 11/2012 for in-stent restenosis and then PTCA of jailed ostial diagonal in 12/2012 ?- presents with chest  pain, peak trop 4600 declining. EKG chronic LBBB ?- limited echo LVEF 35-40% this admit. 11/26/2021 outpatient echo LVEF 45-50% ? ?- medical therpay with ASA 81, atorva 80, core 12.'5mg'$  bid, hep gtt, losartan 50

## 2021-12-07 NOTE — Progress Notes (Signed)
ANTICOAGULATION CONSULT NOTE - Follow Up Consult ? ?Pharmacy Consult for heparin ?Indication:  NSTEMI ? ?Labs: ?Recent Labs  ?  12/05/21 ?1920 12/05/21 ?2136 12/06/21 ?1610 12/06/21 ?9604 12/06/21 ?1146 12/06/21 ?1308 12/06/21 ?1812 12/07/21 ?0040  ?HGB 12.4*  --  12.3*  --   --   --   --  11.8*  ?HCT 37.7*  --  37.9*  --   --   --   --  35.8*  ?PLT 264  --  238  --   --   --   --  225  ?HEPARINUNFRC  --   --   --   --   --  0.11*  --  0.43  ?CREATININE 0.67  --  0.53*  --   --   --   --   --   ?TROPONINIHS 22*   < > 4,601* 4,420* 4,513*  --  3,053*  --   ? < > = values in this interval not displayed.  ? ? ?Assessment/Plan:  ?71yo male therapeutic on heparin after rate change. Will continue infusion at current rate of 1600 units/hr and confirm stable with additional level.  ? ?Wynona Neat, PharmD, BCPS  ?12/07/2021,1:30 AM ? ? ?

## 2021-12-07 NOTE — Plan of Care (Signed)

## 2021-12-07 NOTE — Progress Notes (Signed)
?PROGRESS NOTE ? ? ? ?Randy  Castro DOB: 1950-12-19 DOA: 12/05/2021 ?PCP: Colon Branch, MD  ? ? ? ?Brief Narrative:  ?Randy Castro. is a 71 y.o. male with medical history significant of coronary artery disease status post 2 stents in 2012 and 2014, hypertension, ischemic cardiomyopathy, type 2 diabetes mellitus, GERD, and more presents the ED with a chief complaint of chest pain ? ? ?Subjective: ? ?He reports nitrodrip give him some headache, tylenol helped,  ?He denies chest pain, no sob, no edema ? ?Assessment & Plan: ? Principal Problem: ?  Elevated troponin ?Active Problems: ?  Hyperlipidemia ?  HTN (hypertension) ?  NSTEMI (non-ST elevated myocardial infarction) ([place]) ?  Coronary Artery Disease ?  Type 2 diabetes mellitus with hyperglycemia, with long-term current use of insulin ([place]) ?  GERD (gastroesophageal reflux disease) ? ? ? ?Assessment and Plan: ? ?NSTEMI ?On heparin drip and nitrodrip ?Cath in am ?Plan per cardiology ? ?Coronary Artery Disease ?- Resume home medications, beta-blocker, statin, ARB, aspirin ?-Patient has had 2 stents placed 22,012 and 08/2012 ?--Echo done at the beginning of April that shows ejection fraction of 45-50 percent, with LV mildly decreased function.  LV demonstrates global hypokinesis.  LVH present.  Diastolic parameters are consistent with grade 2 diastolic dysfunction. ? ? ?Type 2 diabetes mellitus with hyperglycemia, with long-term current use of insulin ([facility]) ?- 20 units of basal insulin at home ?-Reduce to 10 units while in the hospital ?-Sliding scale coverage ?-Carb modified diet ?-Last hemoglobin A1c 7.8 ?-Continue to monitor CBGs ? ? ? ?HTN (hypertension) ?- bp stable on current regimen ?-Continue to monitor ? ?Hyperlipidemia ?- Continue statin medication ?-Continue to monitor ? ?GERD (gastroesophageal reflux disease) ?- Continue Protonix ?-Continue to monitor ? ? ? ?I have Reviewed nursing notes, Vitals, pain scores, I/o's, Lab results and   imaging results since pt's last encounter, details please see discussion above  ?I ordered the following labs:  ?Unresulted Labs (From admission, onward)  ? ?  Start     Ordered  ? 12/08/21 0500  Heparin level (unfractionated)  Daily,   R     ?See Hyperspace for full Linked Orders Report.  ? 12/07/21 0130  ? 12/08/21 0500  CBC  Daily,   R     ?See Hyperspace for full Linked Orders Report.  ? 12/07/21 0130  ? 12/08/21 1224  Basic metabolic panel  Tomorrow morning,   R       ?Question:  Specimen collection method  Answer:  Lab=Lab collect  ? 12/07/21 2251  ? ?  ?  ? ?  ? ? ? ?DVT prophylaxis: SCDs Start: 12/06/21 0454 ? ? ?Code Status:   Code Status: Full Code ? ?Family Communication: patient  ?Disposition:  ? ?Status is: Inpatient ? ? ?Dispo: The patient is from: home ?             Anticipated d/c is to: home ?             Anticipated d/c date is: >24hrs ? ? ? ? ?Objective: ?Vitals:  ? 12/07/21 1130 12/07/21 1639 12/07/21 1702 12/07/21 1953  ?BP: 132/60 (!) 141/67  140/67  ?Pulse: 60   62  ?Resp: 17   16  ?Temp: 97.7 ?F (36.5 ?C) 98.3 ?F (36.8 ?C)  98.1 ?F (36.7 ?C)  ?TempSrc: Oral Oral  Oral  ?SpO2: 98% 95%  98%  ?Weight:   98.1 kg   ?Height:      ? ? ?  Intake/Output Summary (Last 24 hours) at 12/07/2021 2252 ?Last data filed at 12/07/2021 2229 ?Gross per 24 hour  ?Intake 480 ml  ?Output 150 ml  ?Net 330 ml  ? ?Filed Weights  ? 12/05/21 1916 12/07/21 0300 12/07/21 1702  ?Weight: 97.5 kg 98.1 kg 98.1 kg  ? ? ?Examination: ? ?General exam: alert, awake, communicative,calm, NAD ?Respiratory system: Clear to auscultation. Respiratory effort normal. ?Cardiovascular system:  RRR.  ?Gastrointestinal system: Abdomen is nondistended, soft and nontender.  Normal bowel sounds heard. ?Central nervous system: Alert and oriented. No focal neurological deficits. ?Extremities:  no edema ?Skin: No rashes, lesions or ulcers ?Psychiatry: Judgement and insight appear normal. Mood & affect appropriate.  ? ? ? ?Data Reviewed: I have  personally reviewed  labs and visualized  imaging studies since the last encounter and formulate the plan  ? ? ? ? ? ? ?Scheduled Meds: ? aspirin EC  81 mg Oral Daily  ? atorvastatin  80 mg Oral Daily  ? carvedilol  12.5 mg Oral BID WC  ? dapagliflozin propanediol  10 mg Oral Daily  ? FLUoxetine  40 mg Oral Daily  ? insulin aspart  0-15 Units Subcutaneous TID WC  ? insulin aspart  0-5 Units Subcutaneous QHS  ? insulin detemir  10 Units Subcutaneous QHS  ? losartan  50 mg Oral Daily  ? pantoprazole  40 mg Oral Daily  ? primidone  50 mg Oral BID  ? sodium chloride flush  3 mL Intravenous Q12H  ? ?Continuous Infusions: ? sodium chloride    ? [START ON 12/08/2021] sodium chloride    ? heparin 1,600 Units/hr (12/06/21 2047)  ? nitroGLYCERIN 15 mcg/min (12/06/21 2047)  ? ? ? LOS: 0 days  ? ? ? ?Florencia Reasons, MD PhD FACP ?[facility] ? ?Available via Epic secure chat 7am-7pm for nonurgent issues ?Please page for urgent issues ?To page the attending provider between 7A-7P or the covering provider during after hours 7P-7A, please log into the web site [email or web address] and access using universal [place] password for that web site. If you do not have the password, please call the hospital operator. ? ? ? ?12/07/2021, 10:52 PM  ? ? ?

## 2021-12-08 ENCOUNTER — Other Ambulatory Visit (HOSPITAL_COMMUNITY): Payer: Self-pay

## 2021-12-08 ENCOUNTER — Encounter (HOSPITAL_COMMUNITY): Payer: Self-pay | Admitting: Internal Medicine

## 2021-12-08 ENCOUNTER — Encounter (HOSPITAL_COMMUNITY)
Admission: EM | Disposition: A | Discharge status: Home / Self Care | Payer: Self-pay | Source: Home / Self Care | Attending: Internal Medicine

## 2021-12-08 DIAGNOSIS — I214 Non-ST elevation (NSTEMI) myocardial infarction: Secondary | ICD-10-CM | POA: Diagnosis not present

## 2021-12-08 DIAGNOSIS — K219 Gastro-esophageal reflux disease without esophagitis: Secondary | ICD-10-CM | POA: Diagnosis not present

## 2021-12-08 DIAGNOSIS — I2510 Atherosclerotic heart disease of native coronary artery without angina pectoris: Secondary | ICD-10-CM | POA: Diagnosis not present

## 2021-12-08 DIAGNOSIS — R778 Other specified abnormalities of plasma proteins: Secondary | ICD-10-CM | POA: Diagnosis not present

## 2021-12-08 HISTORY — PX: CORONARY STENT INTERVENTION: CATH118234

## 2021-12-08 HISTORY — PX: LEFT HEART CATH AND CORONARY ANGIOGRAPHY: CATH118249

## 2021-12-08 LAB — BASIC METABOLIC PANEL
Anion gap: 8 (ref 5–15)
BUN: 11 mg/dL (ref 8–23)
CO2: 29 mmol/L (ref 22–32)
Calcium: 8.9 mg/dL (ref 8.9–10.3)
Chloride: 101 mmol/L (ref 98–111)
Creatinine, Ser: 0.81 mg/dL (ref 0.61–1.24)
GFR, Estimated: >60 mL/min (ref >60)
Glucose, Bld: 161 mg/dL — ABNORMAL HIGH (ref 70–99)
Potassium: 3.4 mmol/L — ABNORMAL LOW (ref 3.5–5.1)
Sodium: 138 mmol/L (ref 135–145)

## 2021-12-08 LAB — CBC
HCT: 34.6 % — ABNORMAL LOW (ref 39.0–52.0)
Hemoglobin: 11.4 g/dL — ABNORMAL LOW (ref 13.0–17.0)
MCH: 28.6 pg (ref 26.0–34.0)
MCHC: 32.9 g/dL (ref 30.0–36.0)
MCV: 86.9 fL (ref 80.0–100.0)
Platelets: 236 10*3/uL (ref 150–400)
RBC: 3.98 MIL/uL — ABNORMAL LOW (ref 4.22–5.81)
RDW: 13.8 % (ref 11.5–15.5)
WBC: 8 10*3/uL (ref 4.0–10.5)
nRBC: 0 % (ref 0.0–0.2)

## 2021-12-08 LAB — HEPARIN LEVEL (UNFRACTIONATED): Heparin Unfractionated: 0.53 IU/mL (ref 0.30–0.70)

## 2021-12-08 LAB — GLUCOSE, CAPILLARY
Glucose-Capillary: 157 mg/dL — ABNORMAL HIGH (ref 70–99)
Glucose-Capillary: 136 mg/dL — ABNORMAL HIGH (ref 70–99)
Glucose-Capillary: 240 mg/dL — ABNORMAL HIGH (ref 70–99)
Glucose-Capillary: 169 mg/dL — ABNORMAL HIGH (ref 70–99)

## 2021-12-08 LAB — POCT ACTIVATED CLOTTING TIME
Activated Clotting Time: 257 seconds
Activated Clotting Time: 329 s

## 2021-12-08 SURGERY — LEFT HEART CATH AND CORONARY ANGIOGRAPHY
Anesthesia: LOCAL

## 2021-12-08 MED ORDER — LABETALOL HCL 5 MG/ML IV SOLN: 10.0000 mg | INTRAVENOUS | Status: AC | PRN

## 2021-12-08 MED ORDER — FENTANYL CITRATE (PF) 100 MCG/2ML IJ SOLN
INTRAMUSCULAR | Status: DC | PRN
  Administered 2021-12-08 (×2): 25 ug via INTRAVENOUS

## 2021-12-08 MED ORDER — HEPARIN SODIUM (PORCINE) 1000 UNIT/ML IJ SOLN
INTRAMUSCULAR | Status: AC
  Filled 2021-12-08: qty 10

## 2021-12-08 MED ORDER — MIDAZOLAM HCL 2 MG/2ML IJ SOLN
INTRAMUSCULAR | Status: DC | PRN
  Administered 2021-12-08 (×2): 1 mg via INTRAVENOUS

## 2021-12-08 MED ORDER — MIDAZOLAM HCL 2 MG/2ML IJ SOLN
INTRAMUSCULAR | Status: AC
  Filled 2021-12-08: qty 2

## 2021-12-08 MED ORDER — CLOPIDOGREL BISULFATE 75 MG PO TABS
75.0000 mg | ORAL_TABLET | Freq: Every day | ORAL | Status: DC
  Administered 2021-12-09: 75 mg via ORAL
  Filled 2021-12-08: qty 1

## 2021-12-08 MED ORDER — HEPARIN (PORCINE) IN NACL 1000-0.9 UT/500ML-% IV SOLN
INTRAVENOUS | Status: AC
  Filled 2021-12-08: qty 1000

## 2021-12-08 MED ORDER — SODIUM CHLORIDE 0.9 % IV SOLN: 250.0000 mL | INTRAVENOUS | Status: DC | PRN

## 2021-12-08 MED ORDER — CLOPIDOGREL BISULFATE 75 MG PO TABS
600.0000 mg | ORAL_TABLET | Freq: Once | ORAL | Status: AC
  Administered 2021-12-08: 600 mg via ORAL
  Filled 2021-12-08: qty 8

## 2021-12-08 MED ORDER — HYDRALAZINE HCL 20 MG/ML IJ SOLN: 10.0000 mg | INTRAMUSCULAR | Status: AC | PRN

## 2021-12-08 MED ORDER — LIDOCAINE HCL (PF) 1 % IJ SOLN
INTRAMUSCULAR | Status: DC | PRN
  Administered 2021-12-08: 2 mL

## 2021-12-08 MED ORDER — ACETAMINOPHEN 325 MG PO TABS: 650.0000 mg | ORAL_TABLET | ORAL | Status: DC | PRN

## 2021-12-08 MED ORDER — VERAPAMIL HCL 2.5 MG/ML IV SOLN
INTRAVENOUS | Status: AC
  Filled 2021-12-08: qty 2

## 2021-12-08 MED ORDER — HEPARIN SODIUM (PORCINE) 1000 UNIT/ML IJ SOLN
INTRAMUSCULAR | Status: DC | PRN
Start: 2021-12-08 — End: 2021-12-08
  Administered 2021-12-08: 3000 [IU] via INTRAVENOUS
  Administered 2021-12-08 (×2): 5000 [IU] via INTRAVENOUS

## 2021-12-08 MED ORDER — SODIUM CHLORIDE 0.9% FLUSH
3.0000 mL | Freq: Two times a day (BID) | INTRAVENOUS | Status: DC
  Administered 2021-12-08 (×2): 3 mL via INTRAVENOUS

## 2021-12-08 MED ORDER — HEPARIN (PORCINE) IN NACL 1000-0.9 UT/500ML-% IV SOLN
INTRAVENOUS | Status: DC | PRN
  Administered 2021-12-08 (×2): 500 mL

## 2021-12-08 MED ORDER — TICAGRELOR 90 MG PO TABS: 90.0000 mg | ORAL_TABLET | Freq: Two times a day (BID) | ORAL | Status: DC

## 2021-12-08 MED ORDER — TICAGRELOR 90 MG PO TABS
ORAL_TABLET | ORAL | Status: AC
  Filled 2021-12-08: qty 2

## 2021-12-08 MED ORDER — POTASSIUM CHLORIDE CRYS ER 20 MEQ PO TBCR
40.0000 meq | EXTENDED_RELEASE_TABLET | Freq: Once | ORAL | Status: AC
  Administered 2021-12-08: 40 meq via ORAL
  Filled 2021-12-08: qty 2

## 2021-12-08 MED ORDER — SODIUM CHLORIDE 0.9 % IV SOLN: INTRAVENOUS | Status: AC

## 2021-12-08 MED ORDER — TICAGRELOR 90 MG PO TABS
ORAL_TABLET | ORAL | Status: DC | PRN
  Administered 2021-12-08: 180 mg via ORAL

## 2021-12-08 MED ORDER — LIDOCAINE HCL (PF) 1 % IJ SOLN
INTRAMUSCULAR | Status: AC
  Filled 2021-12-08: qty 30

## 2021-12-08 MED ORDER — FENTANYL CITRATE (PF) 100 MCG/2ML IJ SOLN
INTRAMUSCULAR | Status: AC
  Filled 2021-12-08: qty 2

## 2021-12-08 MED ORDER — ONDANSETRON HCL 4 MG/2ML IJ SOLN: 4.0000 mg | Freq: Four times a day (QID) | INTRAMUSCULAR | Status: DC | PRN

## 2021-12-08 MED ORDER — SODIUM CHLORIDE 0.9% FLUSH: 3.0000 mL | INTRAVENOUS | Status: DC | PRN

## 2021-12-08 MED ORDER — IOHEXOL 350 MG/ML SOLN
INTRAVENOUS | Status: DC | PRN
  Administered 2021-12-08: 120 mL

## 2021-12-08 MED ORDER — VERAPAMIL HCL 2.5 MG/ML IV SOLN
INTRAVENOUS | Status: DC | PRN
  Administered 2021-12-08: 10 mL via INTRA_ARTERIAL

## 2021-12-08 SURGICAL SUPPLY — 25 items
BALL SAPPHIRE NC24 3.0X12 (BALLOONS) ×2
BALL SAPPHIRE NC24 3.0X15 (BALLOONS) ×4
BALLN SAPPHIRE 2.5X15 (BALLOONS) ×2
BALLOON SAPPHIRE 2.5X15 (BALLOONS) IMPLANT
BALLOON SAPPHIRE NC24 3.0X12 (BALLOONS) IMPLANT
BALLOON SAPPHIRE NC24 3.0X15 (BALLOONS) IMPLANT
CATH INFINITI 6F AL1 (CATHETERS) ×1 IMPLANT
CATH INFINITI 6F ANG MULTIPACK (CATHETERS) ×1 IMPLANT
CATH JL3.5 FR DIAG (CATHETERS) ×1 IMPLANT
CATH LAUNCHER 6FR AL1 (CATHETERS) IMPLANT
CATHETER LAUNCHER 6FR AL1 (CATHETERS) ×2
DEVICE RAD COMP TR BAND LRG (VASCULAR PRODUCTS) ×1 IMPLANT
GLIDESHEATH SLEND SS 6F .021 (SHEATH) ×1 IMPLANT
GUIDEWIRE INQWIRE 1.5J.035X260 (WIRE) IMPLANT
GUIDEWIRE VAS SION BLUE 190 (WIRE) ×1 IMPLANT
INQWIRE 1.5J .035X260CM (WIRE) ×4
KIT ENCORE 26 ADVANTAGE (KITS) ×2 IMPLANT
KIT ESSENTIALS PG (KITS) IMPLANT
KIT HEART LEFT (KITS) ×2 IMPLANT
KIT HEMO VALVE WATCHDOG (MISCELLANEOUS) ×1 IMPLANT
PACK CARDIAC CATHETERIZATION (CUSTOM PROCEDURE TRAY) ×2 IMPLANT
STENT ONYX FRONTIER 2.75X18 (Permanent Stent) ×1 IMPLANT
TRANSDUCER W/STOPCOCK (MISCELLANEOUS) ×2 IMPLANT
TUBING CIL FLEX 10 FLL-RA (TUBING) ×2 IMPLANT
WIRE ASAHI PROWATER 180CM (WIRE) ×1 IMPLANT

## 2021-12-08 NOTE — Hospital Course (Addendum)
Randy Castro. is a 71 y.o. male with past medical history of coronary artery disease status post 2 stents in 2000 1214, hypertension, ischemic cardiomyopathy, type 2 diabetes, GERD presented to hospital with chest pain.  Patient had mildly elevated troponins.  Cardiology was consulted and patient was admitted to the hospital for further evaluation and treatment. ? ?Assessment and plan  ? ?NSTEMI ?Patient presented with chest pain and significantly elevated troponins.  Seen by cardiology.  Was initially on heparin drip and nitrodrip.  Patient underwent cardiac catheterization on 12/08/2021 with findings of high-grade restenosis of the first diagonal stent and a stent was placed in.  We will continue to monitor the patient overnight.  No current chest pain at this time. ? ?Coronary Artery Disease ?On beta-blocker, statin, ARB, aspirin status post history of stent placement in the past but.  2D echocardiogram from 12/06/2021 showed LV ejection fraction of 35 to 40% with global hypokinesis.  Status post stent placement on 12/08/2021. ?  ?Type 2 diabetes mellitus with hyperglycemia, with long-term current use of insulin ([place]) ?On 20 units of basal insulin at home.  Continue sliding scale insulin.  Latest hemoglobin A1c of 7.8.  Currently on 10 units while in the hospital  ?  ?HTN (hypertension) ?On Coreg and losartan. ? ?Hyperlipidemia ?Continue statins ?  ?GERD (gastroesophageal reflux disease) ?- Continue Protonix ? ?

## 2021-12-08 NOTE — Progress Notes (Signed)
? ?Progress Note ? ?Patient Name: Randy Castro. ?Date of Encounter: 12/08/2021 ? ?[facility] Cardiologist: Rozann Lesches, MD  ? ?Subjective  ? ?No CP ? ?Inpatient Medications  ?  ?Scheduled Meds: ? aspirin EC  81 mg Oral Daily  ? atorvastatin  80 mg Oral Daily  ? carvedilol  12.5 mg Oral BID WC  ? [START ON 12/09/2021] clopidogrel  75 mg Oral Daily  ? dapagliflozin propanediol  10 mg Oral Daily  ? FLUoxetine  40 mg Oral Daily  ? insulin aspart  0-15 Units Subcutaneous TID WC  ? insulin aspart  0-5 Units Subcutaneous QHS  ? insulin detemir  10 Units Subcutaneous QHS  ? losartan  50 mg Oral Daily  ? pantoprazole  40 mg Oral Daily  ? primidone  50 mg Oral BID  ? sodium chloride flush  3 mL Intravenous Q12H  ? ?Continuous Infusions: ? sodium chloride    ? nitroGLYCERIN Stopped (12/08/21 1315)  ? ?PRN Meds: ?sodium chloride, acetaminophen, hydrALAZINE, labetalol, melatonin, ondansetron (ZOFRAN) IV, ondansetron (ZOFRAN) IV, oxyCODONE, sodium chloride flush  ? ?Vital Signs  ?  ?Vitals:  ? 12/08/21 1155 12/08/21 1210 12/08/21 1402 12/08/21 1642  ?BP: (!) 137/55 (!) 143/60 (!) 165/74 (!) 149/40  ?Pulse: (!) 57 (!) 58 64 62  ?Resp: '18 17 17 17  '$ ?Temp:   98 ?F (36.7 ?C) 98.7 ?F (37.1 ?C)  ?TempSrc:   Oral Oral  ?SpO2: 98% 97% 97%   ?Weight:      ?Height:      ? ? ?Intake/Output Summary (Last 24 hours) at 12/08/2021 1829 ?Last data filed at 12/08/2021 1100 ?Gross per 24 hour  ?Intake 540 ml  ?Output 600 ml  ?Net -60 ml  ? ? ?  12/07/2021  ?  5:02 PM 12/07/2021  ?  3:00 AM 12/05/2021  ?  7:16 PM  ?Last 3 Weights  ?Weight (lbs) 216 lb 4.3 oz 216 lb 4.3 oz 215 lb  ?Weight (kg) 98.1 kg 98.1 kg 97.523 kg  ?   ? ?Telemetry  ?  ?NSR - Personally Reviewed ? ?ECG  ?  ? ? ?Physical Exam  ? ?GEN: No acute distress.   ?Neck: No JVD ?Cardiac: RRR, no murmurs, rubs, or gallops.  ?Respiratory: Clear to auscultation bilaterally. ?GI: Soft, nontender, non-distended  ?MS: No edema; No deformity. TR band in place. Palpable radial  pulse ?Neuro:  Nonfocal  ?Psych: Normal affect  ? ?Labs  ?  ?High Sensitivity Troponin:   ?Recent Labs  ?Lab 12/05/21 ?2136 12/06/21 ?0940 12/06/21 ?7680 12/06/21 ?1146 12/06/21 ?1812  ?TROPONINIHS 85* 4,601* 4,420* 4,513* 3,053*  ?   ?Chemistry ?Recent Labs  ?Lab 12/06/21 ?8811 12/07/21 ?0040 12/08/21 ?0412  ?NA 139 135 138  ?K 3.7 3.5 3.4*  ?CL 104 103 101  ?CO2 '30 26 29  '$ ?GLUCOSE 155* 183* 161*  ?BUN '11 8 11  '$ ?CREATININE 0.53* 0.69 0.81  ?CALCIUM 8.9 8.4* 8.9  ?MG 1.8 1.7  --   ?PROT 6.3* 5.3*  --   ?ALBUMIN 3.6 3.0*  --   ?AST 43* 30  --   ?ALT 42 32  --   ?ALKPHOS 61 53  --   ?BILITOT 0.4 0.6  --   ?GFRNONAA >60 >60 >60  ?ANIONGAP '5 6 8  '$ ?  ?Lipids No results for input(s): CHOL, TRIG, HDL, LABVLDL, LDLCALC, CHOLHDL in the last 168 hours.  ?Hematology ?Recent Labs  ?Lab 12/06/21 ?0315 12/07/21 ?0040 12/08/21 ?0412  ?WBC 6.4 6.4 8.0  ?  RBC 4.36 4.17* 3.98*  ?HGB 12.3* 11.8* 11.4*  ?HCT 37.9* 35.8* 34.6*  ?MCV 86.9 85.9 86.9  ?MCH 28.2 28.3 28.6  ?MCHC 32.5 33.0 32.9  ?RDW 13.9 13.8 13.8  ?PLT 238 225 236  ? ?Thyroid No results for input(s): TSH, FREET4 in the last 168 hours.  ?BNPNo results for input(s): BNP, PROBNP in the last 168 hours.  ?DDimer No results for input(s): DDIMER in the last 168 hours.  ? ?Radiology  ?  ?CARDIAC CATHETERIZATION ? ?Result Date: 12/08/2021 ?  Prox LAD lesion is 10% stenosed.   1st Diag lesion is 99% stenosed.   A stent was successfully placed.   Post intervention, there is a 0% residual stenosis.   LV end diastolic pressure is normal. 1.  High-grade restenosis of the ostium of jailed first diagonal treated with 1 drug-eluting stent with POT and kissing balloon inflation in a reverse [place] configuration. 2.  LVEDP of 7 mmHg. Recommendation: Dual antiplatelet therapy for ideally 1 year and medical management of coronary artery disease.   ? ?Cardiac Studies  ? ?Cath films reviewed ? ?Patient Profile  ?   ?71 y.o. male with CAD/NSTEMI ? ?Assessment & Plan  ?  ?Successful PCI of diagonal  with stent, PTCA to prior LAD stent. Continue DAPT for 12 months and clopidogrel monotherapy after that time.  ?Plan discharge in AM.  ?   ? ?For questions or updates, please contact [place] ?Please consult [email or web address] for contact info under  ? ?  ?   ?Signed, ?Larae Grooms, MD  ?12/08/2021, 6:29 PM   ? ?

## 2021-12-08 NOTE — TOC Benefit Eligibility Note (Addendum)
Patient Advocate Encounter ? ?Insurance verification completed.   ? ?The patient is currently admitted and upon discharge could be taking Brilinta 90 mg. ? ?The current 30 day co-pay is, $45.00.  ? ?The patient is currently admitted and upon discharge could be taking Entresto 24-26 mg. ? ?The current 30 day co-pay is, $45.00.  ? ?The patient is insured through [employer] Medicare Part D  ? ? ? ?Lyndel Safe, CPhT ?Pharmacy Patient Advocate Specialist ?[place] Patient Advocate Team ?Direct Number: [phone]  Fax: [phone] ? ? ? ? ? ?  ?

## 2021-12-08 NOTE — Interval H&P Note (Signed)
History and Physical Interval Note: ? ?12/08/2021 ?6:42 AM ? ?[name]  has presented today for surgery, with the diagnosis of NSTEMI.  The various methods of treatment have been discussed with the patient and family. After consideration of risks, benefits and other options for treatment, the patient has consented to  Procedure(s): ?LEFT HEART CATH AND CORONARY ANGIOGRAPHY (N/A) as a surgical intervention.  The patient's history has been reviewed, patient examined, no change in status, stable for surgery.  I have reviewed the patient's chart and labs.  Questions were answered to the patient's satisfaction.   ? ?Cath Lab Visit (complete for each Cath Lab visit) ? ?Clinical Evaluation Leading to the Procedure:  ? ?ACS: Yes.   ? ?Non-ACS:   ? ?Anginal Classification: CCS IV ? ?Anti-ischemic medical therapy: Maximal Therapy (2 or more classes of medications) ? ?Non-Invasive Test Results: No non-invasive testing performed ? ?Prior CABG: No previous CABG ? ? ? ? ? ? ? ?[place] ? ? ?

## 2021-12-08 NOTE — Progress Notes (Signed)
TR BAND REMOVAL ? ?LOCATION:    Right radial  ? ?DEFLATED PER PROTOCOL:    Yes.   ? ?TIME BAND OFF / DRESSING APPLIED:    1320pm A clean dry dressing applied with gauze and tegaderm  ? ?SITE UPON ARRIVAL:    Level 0 ? ?SITE AFTER BAND REMOVAL:    Level 0 ? ?CIRCULATION SENSATION AND MOVEMENT:    Within Normal Limits   Yes.   ? ?COMMENTS:   Care instruction given to patient  ?

## 2021-12-08 NOTE — Progress Notes (Signed)
?PROGRESS NOTE ? ? ? ?Randy  Castro DOB: 1951-05-21 DOA: 12/05/2021 ?PCP: Colon Branch, MD  ? ? ?Brief Narrative:  ?Randy Castro. is a 71 y.o. male with past medical history of coronary artery disease status post 2 stents in 2000 1214, hypertension, ischemic cardiomyopathy, type 2 diabetes, GERD presented to hospital with chest pain.  Patient had mildly elevated troponins.  Cardiology was consulted and patient was admitted to the hospital for further evaluation and treatment. ? ?Assessment and plan  ? ?NSTEMI ?Patient presented with chest pain and significantly elevated troponins.  Seen by cardiology.  Was initially on heparin drip and nitrodrip.  Patient underwent cardiac catheterization on 12/08/2021 with findings of high-grade restenosis of the first diagonal stent and a stent was placed in.  We will continue to monitor the patient overnight.  No current chest pain at this time. ? ?Coronary Artery Disease ?On beta-blocker, statin, ARB, aspirin status post history of stent placement in the past but.  2D echocardiogram from 12/06/2021 showed LV ejection fraction of 35 to 40% with global hypokinesis.  Status post stent placement on 12/08/2021. ?  ?Type 2 diabetes mellitus with hyperglycemia, with long-term current use of insulin ([place]) ?On 20 units of basal insulin at home.  Continue sliding scale insulin.  Latest hemoglobin A1c of 7.8.  Currently on 10 units while in the hospital  ?  ?HTN (hypertension) ?On Coreg and losartan. ? ?Hyperlipidemia ?Continue statins ?  ?GERD (gastroesophageal reflux disease) ?- Continue Protonix ?  ? ? DVT prophylaxis: SCDs Start: 12/06/21 0454 ? ? ?Code Status:   ?  Code Status: Full Code ? ?Disposition: Home likely 12/09/2021 if okay with cardiology. ? ?Status is: Inpatient ? ?Remains inpatient appropriate because: Non-ST elevation MI status postcardiac cath and stent placement. ? ? Family Communication:  ?Spoke with the patient's family including sons and wife at  bedside ? ?Consultants:  ?Cardiology ? ?Procedures:  ?Cardiac catheterization and stent placement on 12/08/2021 ? ?Antimicrobials:  ?None ? ?Anti-infectives (From admission, onward)  ? ? None  ? ?  ? ? ?Subjective: ?Today, patient was seen and examined at bedside.  Seen after cardiac catheterization.  Patient denies any chest pain, shortness of breath, dyspnea. ? ?Objective: ?Vitals:  ? 12/08/21 1155 12/08/21 1210 12/08/21 1402 12/08/21 1642  ?BP: (!) 137/55 (!) 143/60 (!) 165/74 (!) 149/40  ?Pulse: (!) 57 (!) 58 64 62  ?Resp: '18 17 17 17  '$ ?Temp:   98 ?F (36.7 ?C) 98.7 ?F (37.1 ?C)  ?TempSrc:   Oral Oral  ?SpO2: 98% 97% 97%   ?Weight:      ?Height:      ? ? ?Intake/Output Summary (Last 24 hours) at 12/08/2021 1701 ?Last data filed at 12/08/2021 1100 ?Gross per 24 hour  ?Intake 540 ml  ?Output 600 ml  ?Net -60 ml  ? ?Filed Weights  ? 12/05/21 1916 12/07/21 0300 12/07/21 1702  ?Weight: 97.5 kg 98.1 kg 98.1 kg  ? ? ?Physical Examination: ?Body mass index is 27.77 kg/m?.  ? ?General:  Average built, not in obvious distress ?HENT:   No scleral pallor or icterus noted. Oral mucosa is moist.  ?Chest:  Clear breath sounds.  Diminished breath sounds bilaterally. No crackles or wheezes.  ?CVS: S1 &S2 heard. No murmur.  Regular rate and rhythm. ?Abdomen: Soft, nontender, nondistended.  Bowel sounds are heard.   ?Extremities: No cyanosis, clubbing or edema.  Peripheral pulses are palpable. ?Psych: Alert, awake and oriented, normal mood ?CNS:  No cranial nerve deficits.  Power equal in all extremities.   ?Skin: Warm and dry.  No rashes noted. ? ?Data Reviewed:  ? ?CBC: ?Recent Labs  ?Lab 12/05/21 ?1920 12/06/21 ?2263 12/07/21 ?0040 12/08/21 ?0412  ?WBC 6.3 6.4 6.4 8.0  ?NEUTROABS 3.3  --   --   --   ?HGB 12.4* 12.3* 11.8* 11.4*  ?HCT 37.7* 37.9* 35.8* 34.6*  ?MCV 85.9 86.9 85.9 86.9  ?PLT 264 238 225 236  ? ? ?Basic Metabolic Panel: ?Recent Labs  ?Lab 12/05/21 ?1920 12/06/21 ?3354 12/07/21 ?0040 12/08/21 ?0412  ?NA 138 139 135 138   ?K 3.5 3.7 3.5 3.4*  ?CL 105 104 103 101  ?CO2 '26 30 26 29  '$ ?GLUCOSE 163* 155* 183* 161*  ?BUN '16 11 8 11  '$ ?CREATININE 0.67 0.53* 0.69 0.81  ?CALCIUM 9.0 8.9 8.4* 8.9  ?MG  --  1.8 1.7  --   ? ? ?Liver Function Tests: ?Recent Labs  ?Lab 12/06/21 ?5625 12/07/21 ?0040  ?AST 43* 30  ?ALT 42 32  ?ALKPHOS 61 53  ?BILITOT 0.4 0.6  ?PROT 6.3* 5.3*  ?ALBUMIN 3.6 3.0*  ? ? ? ?Radiology Studies: ?CARDIAC CATHETERIZATION ? ?Result Date: 12/08/2021 ?  Prox LAD lesion is 10% stenosed.   1st Diag lesion is 99% stenosed.   A stent was successfully placed.   Post intervention, there is a 0% residual stenosis.   LV end diastolic pressure is normal. 1.  High-grade restenosis of the ostium of jailed first diagonal treated with 1 drug-eluting stent with POT and kissing balloon inflation in a reverse Culotte configuration. 2.  LVEDP of 7 mmHg. Recommendation: Dual antiplatelet therapy for ideally 1 year and medical management of coronary artery disease.   ? ? ? LOS: 1 day  ? ? ?Flora Lipps, MD ?[facility] ?Available via Epic secure chat 7am-7pm ?After these hours, please refer to coverage provider listed on [email or web address] ?12/08/2021, 5:01 PM  ? ? ?

## 2021-12-09 ENCOUNTER — Other Ambulatory Visit (HOSPITAL_COMMUNITY): Payer: Self-pay

## 2021-12-09 DIAGNOSIS — I25118 Atherosclerotic heart disease of native coronary artery with other forms of angina pectoris: Secondary | ICD-10-CM

## 2021-12-09 DIAGNOSIS — K219 Gastro-esophageal reflux disease without esophagitis: Secondary | ICD-10-CM | POA: Diagnosis not present

## 2021-12-09 DIAGNOSIS — I214 Non-ST elevation (NSTEMI) myocardial infarction: Secondary | ICD-10-CM | POA: Diagnosis not present

## 2021-12-09 DIAGNOSIS — R778 Other specified abnormalities of plasma proteins: Secondary | ICD-10-CM | POA: Diagnosis not present

## 2021-12-09 LAB — CBC
HCT: 34.3 % — ABNORMAL LOW (ref 39.0–52.0)
Hemoglobin: 11.5 g/dL — ABNORMAL LOW (ref 13.0–17.0)
MCH: 28.9 pg (ref 26.0–34.0)
MCHC: 33.5 g/dL (ref 30.0–36.0)
MCV: 86.2 fL (ref 80.0–100.0)
Platelets: 207 10*3/uL (ref 150–400)
RBC: 3.98 MIL/uL — ABNORMAL LOW (ref 4.22–5.81)
RDW: 14 % (ref 11.5–15.5)
WBC: 6.1 10*3/uL (ref 4.0–10.5)
nRBC: 0 % (ref 0.0–0.2)

## 2021-12-09 LAB — GLUCOSE, CAPILLARY: Glucose-Capillary: 177 mg/dL — ABNORMAL HIGH (ref 70–99)

## 2021-12-09 LAB — BASIC METABOLIC PANEL
Anion gap: 9 (ref 5–15)
BUN: 7 mg/dL — ABNORMAL LOW (ref 8–23)
CO2: 26 mmol/L (ref 22–32)
Calcium: 8.8 mg/dL — ABNORMAL LOW (ref 8.9–10.3)
Chloride: 101 mmol/L (ref 98–111)
Creatinine, Ser: 0.72 mg/dL (ref 0.61–1.24)
GFR, Estimated: >60 mL/min (ref >60)
Glucose, Bld: 146 mg/dL — ABNORMAL HIGH (ref 70–99)
Potassium: 4.1 mmol/L (ref 3.5–5.1)
Sodium: 136 mmol/L (ref 135–145)

## 2021-12-09 MED ORDER — SACUBITRIL-VALSARTAN 24-26 MG PO TABS
1.0000 | ORAL_TABLET | Freq: Two times a day (BID) | ORAL | Status: DC
  Administered 2021-12-09: 1 via ORAL
  Filled 2021-12-09: qty 1

## 2021-12-09 MED ORDER — CLOPIDOGREL BISULFATE 75 MG PO TABS: 75.0000 mg | ORAL_TABLET | Freq: Every day | ORAL | 2 refills | Status: DC

## 2021-12-09 MED ORDER — SACUBITRIL-VALSARTAN 24-26 MG PO TABS: 1.0000 | ORAL_TABLET | Freq: Two times a day (BID) | ORAL | 2 refills | Status: DC

## 2021-12-09 NOTE — Progress Notes (Signed)
CARDIAC REHAB PHASE I  ? ?PRE:  Rate/Rhythm: 60 SR ? ?  BP: sitting 139/73 ? ?  SaO2:  ? ?MODE:  Ambulation: 400 ft  ? ?POST:  Rate/Rhythm: 80 SR ? ?  BP: sitting 158/71  ? ?  SaO2:  ? ?Tolerated well at slow pace, has bad knees. Discussed with pt MI, stent, Brilinta importance, restrictions, diet, exercise, NTG and CRPII. Pt receptive. Will refer to [facility].  ?[phone] ? ?Yves Dill CES, ACSM ?12/09/2021 ?9:08 AM ? ? ? ? ?

## 2021-12-09 NOTE — Discharge Summary (Signed)
? ?Physician Discharge Summary  ?Randy Castro DOB: [birth year] DOA: 12/05/2021 ? ?PCP: Colon Branch, MD ? ?Admit date: 12/05/2021 ?Discharge date: 12/09/2021 ? ?Admitted From: Home ? ?Discharge disposition: Home ? ?Recommendations for Outpatient Follow-Up:  ? ?Follow up with your primary care provider in one week.  ?Check CBC, BMP, magnesium in the next visit ?Follow-up with cardiology as scheduled by the clinic. ? ?Discharge Diagnosis:  ? ?Principal Problem: ?  Elevated troponin ?Active Problems: ?  Hyperlipidemia ?  HTN (hypertension) ?  NSTEMI (non-ST elevated myocardial infarction) ([place]) ?  Coronary Artery Disease ?  Type 2 diabetes mellitus with hyperglycemia, with long-term current use of insulin ([place]) ?  GERD (gastroesophageal reflux disease) ? ?Discharge Condition: Improved. ? ?Diet recommendation: Low sodium, heart healthy.  Carbohydrate-modified.   ? ?Wound care: None. ? ?Code status: Full. ? ? ?History of Present Illness:  ? ?Randy Nishi. is a 71 y.o. male with past medical history of coronary artery disease status post 2 stents in 2000 1214, hypertension, ischemic cardiomyopathy, type 2 diabetes, GERD presented to hospital with chest pain.  Patient had mildly elevated troponins.  Cardiology was consulted and patient was admitted to the hospital for further evaluation and treatment. ? ?Hospital Course:  ? ?Following conditions were addressed during hospitalization as listed below, ? ?NSTEMI ?Patient presented with chest pain and significantly elevated troponins.  Seen by cardiology.  Was initially on heparin drip and nitrodrip.  Patient underwent cardiac catheterization on 12/08/2021 with findings of high-grade restenosis of the first diagonal stent and a stent was placed in.  Patient will continue Plavix for 1 year with aspirin indefinitely.  I spoke with cardiology prior to discharge. ? ? coronary Artery Disease ?On beta-blocker, statin, ARB, aspirin at home with history of  stent  placement in the past.  Plavix will be added to the regimen, status post new intervention this time.  2D echocardiogram from 12/06/2021 showed LV ejection fraction of 35 to 40% with global hypokinesis.  Status post stent placement on 12/08/2021.  Patient has been started on Entresto instead of losartan at this time. ?  ?Type 2 diabetes mellitus with hyperglycemia, with long-term current use of insulin ([place]) ?On 20 units of basal insulin at home.   ?  ?HTN (hypertension) ?On Coreg and losartan at home.  Losartan will be changed to [facility] on discharge. ?  ?Hyperlipidemia ?Continue statins ?  ?GERD (gastroesophageal reflux disease) ?- Continue Protonix ?  ?Disposition.  At this time, patient is stable for disposition home with outpatient PCP and cardiology follow-up ? ?Medical Consultants:  ? ?Cardiology ? ?Procedures: ?  ? ?cardiac catheterization with placement of a stent  on 12/08/2021  ?Subjective:  ? ?Today, patient was seen and examined at bedside.  Denies any chest pain shortness of breath dyspnea.  Has ambulated without issues.  Wishes to go home ? ?Discharge Exam:  ? ?Vitals:  ? 12/09/21 0421 12/09/21 0923  ?BP: (!) 151/75 (!) 158/71  ?Pulse: 61 61  ?Resp: 16   ?Temp: 98.7 ?F (37.1 ?C)   ?SpO2: 97%   ? ?Vitals:  ? 12/08/21 2142 12/09/21 0042 12/09/21 0421 12/09/21 2130  ?BP: (!) 162/72 140/60 (!) 151/75 (!) 158/71  ?Pulse: 62 64 61 61  ?Resp:  18 16   ?Temp: 98.2 ?F (36.8 ?C) 98.4 ?F (36.9 ?C) 98.7 ?F (37.1 ?C)   ?TempSrc: Oral Oral Oral   ?SpO2: 97% 98% 97%   ?Weight:      ?Height:      ? ?  General: Alert awake, not in obvious distress ?HENT: pupils equally reacting to light,  No scleral pallor or icterus noted. Oral mucosa is moist.  ?Chest:  Clear breath sounds.  Diminished breath sounds bilaterally. No crackles or wheezes.  ?CVS: S1 &S2 heard. No murmur.  Regular rate and rhythm. ?Abdomen: Soft, nontender, nondistended.  Bowel sounds are heard.   ?Extremities: No cyanosis, clubbing or edema.  Peripheral  pulses are palpable. ?Psych: Alert, awake and oriented, normal mood ?CNS:  No cranial nerve deficits.  Power equal in all extremities.   ?Skin: Warm and dry.  No rashes noted. ? ?The results of significant diagnostics from this hospitalization (including imaging, microbiology, ancillary and laboratory) are listed below for reference.   ? ? ?Diagnostic Studies:  ? ?DG Chest 2 View ? ?Result Date: 12/05/2021 ?CLINICAL DATA:  Chest pain EXAM: CHEST - 2 VIEW COMPARISON:  Two-view chest x-ray 11/14/2017 FINDINGS: Heart size is normal. Mild interstitial coarsening is stable. No focal airspace disease present. No edema or effusion is present. IMPRESSION: No acute cardiopulmonary disease or significant interval change. Electronically Signed   By: San Morelle M.D.   On: 12/05/2021 19:32  ? ?ECHOCARDIOGRAM LIMITED ? ?Result Date: 12/06/2021 ?   ECHOCARDIOGRAM LIMITED REPORT   Patient Name:   Randy Castro. Date of Exam: 12/06/2021 Medical Rec #:  [id number]         Height:       74.0 in Accession #:    [id number]        Weight:       215.0 lb Date of Birth:  17-Sep-1950          BSA:          2.241 m? Patient Age:    2 years          BP:           148/75 mmHg Patient Gender: M                 HR:           57 bpm. Exam Location:  [facility] Procedure: Limited Echo and Color Doppler Indications:    I21.4 NSTEMI  History:        Patient has prior history of Echocardiogram examinations, most                 recent 11/26/2021. Cardiomyopathy, Previous Myocardial Infarction                 and CAD; Risk Factors:Hypertension, Diabetes and Dyslipidemia.  Sonographer:    Alvino Chapel RCS Referring Phys: [place]  1. Left ventricular ejection fraction, by estimation, is 35 to 40%. The left ventricle has moderately decreased function. The left ventricle demonstrates global hypokinesis. There is mild left ventricular hypertrophy. The average left ventricular global  longitudinal strain is -10.1 %. The global  longitudinal strain is abnormal.  2. Right ventricular systolic function is normal. The right ventricular size is normal. Tricuspid regurgitation signal is inadequate for assessing PA pressure.  3. Left atrial size was mildly dilated.  4. The mitral valve is grossly normal. Mild mitral valve regurgitation.  5. The aortic valve is tricuspid. There is mild thickening of the aortic valve.  6. Aortic dilatation noted. 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  7. The inferior vena cava is normal in size with greater than 50% respiratory variability, suggesting right atrial pressure of 3 mmHg. Comparison(s): A prior  study was performed on 11/26/21. Prior images reviewed side by side. Compared to prior study, LVEF appears globally reduced. FINDINGS  Left Ventricle: Left ventricular ejection fraction, by estimation, is 35 to 40%. The left ventricle has moderately decreased function. The left ventricle demonstrates global hypokinesis. The average left ventricular global longitudinal strain is -10.1 %. The global longitudinal strain is abnormal. There is mild left ventricular hypertrophy. Right Ventricle: The right ventricular size is normal. No increase in right ventricular wall thickness. Right ventricular systolic function is normal. Tricuspid regurgitation signal is inadequate for assessing PA pressure. Left Atrium: Left atrial size was mildly dilated. Mitral Valve: The mitral valve is grossly normal. Mild mitral valve regurgitation. Tricuspid Valve: Tricuspid valve regurgitation is trivial. Aortic Valve: The aortic valve is tricuspid. There is mild thickening of the aortic valve. Aorta: Aortic dilatation noted. 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 Venous: The inferior vena cava is normal in size with greater than 50% respiratory variability, suggesting right atrial pressure of 3 mmHg. LEFT VENTRICLE PLAX 2D LVIDd:         6.00 cm LVIDs:         5.20 cm      2D  Longitudinal Strain LV PW:         1.20 cm      2D Strain GLS Avg:     -10.1 % LV IVS:        1.20 cm LVOT diam:     2.50 cm LVOT Area:     4.91 cm?  LV Volumes (MOD) LV vol d, MOD A2C: 170.0 ml LV vol d, MOD A4C: 162.0

## 2021-12-09 NOTE — Plan of Care (Signed)
  Problem: Education: Goal: Knowledge of General Education information will improve Description: Including pain rating scale, medication(s)/side effects and non-pharmacologic comfort measures Outcome: Adequate for Discharge   Problem: Health Behavior/Discharge Planning: Goal: Ability to manage health-related needs will improve Outcome: Adequate for Discharge   Problem: Clinical Measurements: Goal: Ability to maintain clinical measurements within normal limits will improve Outcome: Adequate for Discharge Goal: Will remain free from infection Outcome: Adequate for Discharge Goal: Diagnostic test results will improve Outcome: Adequate for Discharge Goal: Respiratory complications will improve Outcome: Adequate for Discharge Goal: Cardiovascular complication will be avoided Outcome: Adequate for Discharge   Problem: Activity: Goal: Risk for activity intolerance will decrease Outcome: Adequate for Discharge   Problem: Pain Managment: Goal: General experience of comfort will improve Outcome: Adequate for Discharge   Problem: Safety: Goal: Ability to remain free from injury will improve Outcome: Adequate for Discharge   Problem: Skin Integrity: Goal: Risk for impaired skin integrity will decrease Outcome: Adequate for Discharge   

## 2021-12-09 NOTE — Progress Notes (Addendum)
? ?Progress Note ? ?Patient Name: Randy Castro. ?Date of Encounter: 12/09/2021 ? ?[facility] Cardiologist: Rozann Lesches, MD  ? ?Subjective  ? ?No complaints. Hoping to DC home soon.  ? ?Inpatient Medications  ?  ?Scheduled Meds: ? aspirin EC  81 mg Oral Daily  ? atorvastatin  80 mg Oral Daily  ? carvedilol  12.5 mg Oral BID WC  ? clopidogrel  75 mg Oral Daily  ? dapagliflozin propanediol  10 mg Oral Daily  ? FLUoxetine  40 mg Oral Daily  ? insulin aspart  0-15 Units Subcutaneous TID WC  ? insulin aspart  0-5 Units Subcutaneous QHS  ? insulin detemir  10 Units Subcutaneous QHS  ? losartan  50 mg Oral Daily  ? pantoprazole  40 mg Oral Daily  ? primidone  50 mg Oral BID  ? sodium chloride flush  3 mL Intravenous Q12H  ? ?Continuous Infusions: ? sodium chloride    ? ?PRN Meds: ?sodium chloride, acetaminophen, melatonin, ondansetron (ZOFRAN) IV, ondansetron (ZOFRAN) IV, oxyCODONE, sodium chloride flush  ? ?Vital Signs  ?  ?Vitals:  ? 12/08/21 2015 12/08/21 2142 12/09/21 0042 12/09/21 0421  ?BP: (!) 145/70 (!) 162/72 140/60 (!) 151/75  ?Pulse:  62 64 61  ?Resp: '15  18 16  '$ ?Temp: 98.5 ?F (36.9 ?C) 98.2 ?F (36.8 ?C) 98.4 ?F (36.9 ?C) 98.7 ?F (37.1 ?C)  ?TempSrc: Oral Oral Oral Oral  ?SpO2: 95% 97% 98% 97%  ?Weight:      ?Height:      ? ? ?Intake/Output Summary (Last 24 hours) at 12/09/2021 0913 ?Last data filed at 12/08/2021 1100 ?Gross per 24 hour  ?Intake --  ?Output 400 ml  ?Net -400 ml  ? ? ?  12/07/2021  ?  5:02 PM 12/07/2021  ?  3:00 AM 12/05/2021  ?  7:16 PM  ?Last 3 Weights  ?Weight (lbs) 216 lb 4.3 oz 216 lb 4.3 oz 215 lb  ?Weight (kg) 98.1 kg 98.1 kg 97.523 kg  ?   ? ?Telemetry  ?  ?SR - Personally Reviewed ? ?ECG  ?  ?SB, 1st degree AVB, LBBB - Personally Reviewed ? ?Physical Exam  ? ?GEN: No acute distress.   ?Neck: No JVD ?Cardiac: RRR, no murmurs, rubs, or gallops.  ?Respiratory: Clear to auscultation bilaterally. ?GI: Soft, nontender, non-distended  ?MS: No edema; No deformity. Right radial cath site  stable.  ?Neuro:  Nonfocal  ?Psych: Normal affect  ? ?Labs  ?  ?High Sensitivity Troponin:   ?Recent Labs  ?Lab 12/05/21 ?2136 12/06/21 ?9924 12/06/21 ?2683 12/06/21 ?1146 12/06/21 ?1812  ?TROPONINIHS 85* 4,601* 4,420* 4,513* 3,053*  ?   ?Chemistry ?Recent Labs  ?Lab 12/06/21 ?4196 12/07/21 ?0040 12/08/21 ?2229 12/09/21 ?0413  ?NA 139 135 138 136  ?K 3.7 3.5 3.4* 4.1  ?CL 104 103 101 101  ?CO2 '30 26 29 26  '$ ?GLUCOSE 155* 183* 161* 146*  ?BUN '11 8 11 '$ 7*  ?CREATININE 0.53* 0.69 0.81 0.72  ?CALCIUM 8.9 8.4* 8.9 8.8*  ?MG 1.8 1.7  --   --   ?PROT 6.3* 5.3*  --   --   ?ALBUMIN 3.6 3.0*  --   --   ?AST 43* 30  --   --   ?ALT 42 32  --   --   ?ALKPHOS 61 53  --   --   ?BILITOT 0.4 0.6  --   --   ?GFRNONAA >60 >60 >60 >60  ?ANIONGAP '5 6 8 9  '$ ?  ?  Lipids No results for input(s): CHOL, TRIG, HDL, LABVLDL, LDLCALC, CHOLHDL in the last 168 hours.  ?Hematology ?Recent Labs  ?Lab 12/07/21 ?0040 12/08/21 ?7510 12/09/21 ?0413  ?WBC 6.4 8.0 6.1  ?RBC 4.17* 3.98* 3.98*  ?HGB 11.8* 11.4* 11.5*  ?HCT 35.8* 34.6* 34.3*  ?MCV 85.9 86.9 86.2  ?MCH 28.3 28.6 28.9  ?MCHC 33.0 32.9 33.5  ?RDW 13.8 13.8 14.0  ?PLT 225 236 207  ? ?Thyroid No results for input(s): TSH, FREET4 in the last 168 hours.  ?BNPNo results for input(s): BNP, PROBNP in the last 168 hours.  ?DDimer No results for input(s): DDIMER in the last 168 hours.  ? ?Radiology  ?  ?CARDIAC CATHETERIZATION ? ?Result Date: 12/08/2021 ?  Prox LAD lesion is 10% stenosed.   1st Diag lesion is 99% stenosed.   A stent was successfully placed.   Post intervention, there is a 0% residual stenosis.   LV end diastolic pressure is normal. 1.  High-grade restenosis of the ostium of jailed first diagonal treated with 1 drug-eluting stent with POT and kissing balloon inflation in a reverse [place] configuration. 2.  LVEDP of 7 mmHg. Recommendation: Dual antiplatelet therapy for ideally 1 year and medical management of coronary artery disease.   ? ?Cardiac Studies  ? ?Cath: 12/08/21 ? ?  Prox LAD  lesion is 10% stenosed. ?  1st Diag lesion is 99% stenosed. ?  A stent was successfully placed. ?  Post intervention, there is a 0% residual stenosis. ?  LV end diastolic pressure is normal. ?  ?1.  High-grade restenosis of the ostium of jailed first diagonal treated with 1 drug-eluting stent with POT and kissing balloon inflation in a reverse [place] configuration. ?2.  LVEDP of 7 mmHg. ?  ?Recommendation: Dual antiplatelet therapy for ideally 1 year and medical management of coronary artery disease. ? ?Diagnostic ?Dominance: Left ?Intervention ? ? ? ?Echo: 12/06/21 ? ?IMPRESSIONS  ? ? ? 1. Left ventricular ejection fraction, by estimation, is 35 to 40%. The  ?left ventricle has moderately decreased function. The left ventricle  ?demonstrates global hypokinesis. There is mild left ventricular  ?hypertrophy. The average left ventricular global  ? longitudinal strain is -10.1 %. The global longitudinal strain is  ?abnormal.  ? 2. Right ventricular systolic function is normal. The right ventricular  ?size is normal. Tricuspid regurgitation signal is inadequate for assessing  ?PA pressure.  ? 3. Left atrial size was mildly dilated.  ? 4. The mitral valve is grossly normal. Mild mitral valve regurgitation.  ? 5. The aortic valve is tricuspid. There is mild thickening of the aortic  ?valve.  ? 6. Aortic dilatation noted. There is moderate dilatation of the aortic  ?root, measuring 47 mm.  ? 7. The inferior vena cava is normal in size with greater than 50%  ?respiratory variability, suggesting right atrial pressure of 3 mmHg.  ? ?Comparison(s): A prior study was performed on 11/26/21. Prior images  ?reviewed side by side. Compared to prior study, LVEF appears globally  ?reduced.  ? ?FINDINGS  ? Left Ventricle: Left ventricular ejection fraction, by estimation, is 35  ?to 40%. The left ventricle has moderately decreased function. The left  ?ventricle demonstrates global hypokinesis. The average left ventricular  ?global  longitudinal strain is -10.1  ?%. The global longitudinal strain is abnormal. There is mild left  ?ventricular hypertrophy.  ? ?Right Ventricle: The right ventricular size is normal. No increase in  ?right ventricular wall thickness. Right ventricular systolic function is  ?  normal. Tricuspid regurgitation signal is inadequate for assessing PA  ?pressure.  ? ?Left Atrium: Left atrial size was mildly dilated.  ? ?Mitral Valve: The mitral valve is grossly normal. Mild mitral valve  ?regurgitation.  ? ?Tricuspid Valve: Tricuspid valve regurgitation is trivial.  ? ?Aortic Valve: The aortic valve is tricuspid. There is mild thickening of  ?the aortic valve.  ? ?Aorta: Aortic dilatation noted. There is moderate dilatation of the aortic  ?root, measuring 47 mm.  ? ?Venous: The inferior vena cava is normal in size with greater than 50%  ?respiratory variability, suggesting right atrial pressure of 3 mmHg.  ? ?Patient Profile  ?   ?71 y.o. male a history of CAD s/p PTCA/DES to mid LAD in 07/2011 with repeat PTCA in 11/2012 for in-stent restenosis and then PTCA of jailed ostial diagonal in 12/2012, ischemic cardiomyopathy with EF as low as 40% in 2012 improved to 45 to 50% on last on 11/26/2021, hyperlipidemia, type 2 diabetes, prior GI bleed requiring transfusions, and anxiety who is being seen 12/06/2021 for the evaluation of chest pain at the request of Dr. Erlinda Hong. ? ?Assessment & Plan  ?  ?NSTEMI: prior PTCA/DES to mid LAD in 07/2011 with repeat PTCA in 11/2012 for in-stent restenosis and then PTCA of jailed ostial diagonal in 12/2012. Presented with chest pain, hsTn peaked at 4600. Underwent cardiac cath noted above with high grade stenosis of ostium of jailed 1st diag treated with PCI/DESx1. Initially placed on Brilinta, but switched to plavix given interaction with primidone. Loaded plavix '600mg'$  x1 post cath.  ?-- DAPT with ASA/plavix for at least one year ?-- continue statin, coreg, farxiga, will transition from losartan to  entresto ? 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 ?HLD: LDL 40 ?-- on atorvastatin '80mg'$  daily ? ?D

## 2021-12-18 ENCOUNTER — Other Ambulatory Visit: Payer: PPO

## 2021-12-22 ENCOUNTER — Ambulatory Visit (HOSPITAL_BASED_OUTPATIENT_CLINIC_OR_DEPARTMENT_OTHER): Payer: PPO | Admitting: Family

## 2021-12-22 ENCOUNTER — Encounter (HOSPITAL_BASED_OUTPATIENT_CLINIC_OR_DEPARTMENT_OTHER): Payer: Self-pay | Admitting: Family

## 2021-12-22 VITALS — BP 136/80 | HR 56 | Ht 74.0 in | Wt 213.4 lb

## 2021-12-22 DIAGNOSIS — I10 Essential (primary) hypertension: Secondary | ICD-10-CM

## 2021-12-22 DIAGNOSIS — E1165 Type 2 diabetes mellitus with hyperglycemia: Secondary | ICD-10-CM

## 2021-12-22 DIAGNOSIS — I2510 Atherosclerotic heart disease of native coronary artery without angina pectoris: Secondary | ICD-10-CM

## 2021-12-22 DIAGNOSIS — E785 Hyperlipidemia, unspecified: Secondary | ICD-10-CM

## 2021-12-22 DIAGNOSIS — I255 Ischemic cardiomyopathy: Secondary | ICD-10-CM | POA: Diagnosis not present

## 2021-12-22 DIAGNOSIS — I5022 Chronic systolic (congestive) heart failure: Secondary | ICD-10-CM

## 2021-12-22 MED ORDER — SACUBITRIL-VALSARTAN 24-26 MG PO TABS: 1.0000 | ORAL_TABLET | Freq: Two times a day (BID) | ORAL | 3 refills | Status: DC

## 2021-12-22 MED ORDER — CLOPIDOGREL BISULFATE 75 MG PO TABS: 75.0000 mg | ORAL_TABLET | Freq: Every day | ORAL | 3 refills | Status: DC

## 2021-12-22 MED ORDER — NITROGLYCERIN 0.4 MG SL SUBL: 0.4000 mg | SUBLINGUAL_TABLET | SUBLINGUAL | 3 refills | Status: DC | PRN

## 2021-12-22 NOTE — Patient Instructions (Addendum)
Medication Instructions:  ?Continue your current medications.  ? ?Your Wilder Glade, Carvedilol (Coreg), and Entresto all help to strengthen your heart muscle. If your lab work is stable we may add a medication called Spironolactone. Loel Dubonnet, NP is also going to reach out to Dr. Kelton Pillar about increasing your Wilder Glade dose to help strengthen your heart muscle.  ? ?*If you need a refill on your cardiac medications before your next appointment, please call your pharmacy* ? ? ?Lab Work: ?Your physician recommends that you return for lab work in: CBC, BMP, magnesium  ?If you have labs (blood work) drawn today and your tests are completely normal, you will receive your results only by: ?MyChart Message (if you have MyChart) OR ?A paper copy in the mail ?If you have any lab test that is abnormal or we need to change your treatment, we will call you to review the results. ? ? ?Testing/Procedures: ?Your physician has requested that you have an echocardiogram in September prior to your appointment with Dr. Domenic Polite. Echocardiography is a painless test that uses sound waves to create images of your heart. It provides your doctor with information about the size and shape of your heart and how well your heart?s chambers and valves are working. This procedure takes approximately one hour. There are no restrictions for this procedure. ? ? ?Follow-Up: ?At [facility], you and your health needs are our priority.  As part of our continuing mission to provide you with exceptional heart care, we have created designated Provider Care Teams.  These Care Teams include your primary Cardiologist (physician) and Advanced Practice Providers (APPs -  Physician Assistants and Nurse Practitioners) who all work together to provide you with the care you need, when you need it. ? ?We recommend signing up for the patient portal called "MyChart".  Sign up information is provided on this After Visit Summary.  MyChart is used to connect with  patients for Virtual Visits (Telemedicine).  Patients are able to view lab/test results, encounter notes, upcoming appointments, etc.  Non-urgent messages can be sent to your provider as well.   ?To learn more about what you can do with MyChart, go to [email or web address].   ? ?Your next appointment:   ?As scheduled with Dr. Domenic Polite ? ? ?Other Instructions ? ?Tips to Measure your Blood Pressure Correctly ? ?Check blood pressure three times per week and contact our office if consistently  more than 130/80.  ? ?Here's what you can do to ensure a correct reading: ? Don't drink a caffeinated beverage or smoke during the 30 minutes before the test. ? Sit quietly for five minutes before the test begins. ? During the measurement, sit in a chair with your feet on the floor and your arm supported so your elbow is at about heart level. ? The inflatable part of the cuff should completely cover at least 80% of your upper arm, and the cuff should be placed on bare skin, not over a shirt. ? Don't talk during the measurement. ? ?Blood pressure categories  ?Blood pressure category SYSTOLIC ?(upper number)  DIASTOLIC ?(lower number)  ?Normal Less than 120 mm Hg and Less than 80 mm Hg  ?Elevated 120-129 mm Hg and Less than 80 mm Hg  ?High blood pressure: Stage 1 hypertension 130-139 mm Hg or 80-89 mm Hg  ?High blood pressure: Stage 2 hypertension 140 mm Hg or higher or 90 mm Hg or higher  ?Hypertensive crisis (consult your doctor immediately) Higher than 180 mm Hg and/or Higher  than 120 mm Hg  ?Source: American Heart Association and American Stroke Association. ?For more on getting your blood pressure under control, buy Controlling Your Blood Pressure, a Special Health Report from [facility]. ? ? ?Blood Pressure Log ? ? ?Date ?  ?Time  ?Blood Pressure  ?Position  ?Example: Nov 1 9 AM 124/78 sitting  ? ?     ? ?     ? ?     ? ?     ? ?     ? ?     ? ?     ? ?     ? ? ?  ? ?Heart Healthy Diet Recommendations: ?A  low-salt diet is recommended. Meats should be grilled, baked, or boiled. Avoid fried foods. Focus on lean protein sources like fish or chicken with vegetables and fruits. The American Heart Association is a Microbiologist!  American Heart Association Diet and Lifeystyle Recommendations  ? ?Exercise recommendations: ?The American Heart Association recommends 150 minutes of moderate intensity exercise weekly. ?Try 30 minutes of moderate intensity exercise 4-5 times per week. ?This could include walking, jogging, or swimming. ? ? ?Important Information About Sugar ? ? ? ? ?  ?

## 2021-12-22 NOTE — Progress Notes (Signed)
? ?Office Visit  ?  ?Patient Name: Randy Castro. ?Date of Encounter: 12/22/2021 ? ?PCP:  Colon Branch, MD ?  ?[place]  ?Cardiologist:  Rozann Lesches, MD  ?Advanced Practice Provider:  No care team member to display ?Electrophysiologist:  None  ?   ? ?Chief Complaint  ?  ?Randy Norgaard. is a 71 y.o. male with a hx of CAD (s/p PCI of CAD and cutting balloon angioplasty for ISR and angioplasty diagonal 2014, 11/2021 DES to 1st diagonal), HTN, ICM, DM2, GERD presents today for hospital follow up  ? ?Past Medical History  ?  ?Past Medical History:  ?Diagnosis Date  ? Anxiety   ? Arthritis   ? BCC (basal cell carcinoma of skin) 12/2020  ? CAD (coronary artery disease)   ? a. NSTEMI 12/12: EF 40-45%. MPN:TIRWERX balloon PTCA + Promus DES x 1 to mid LAD;  b. 11/2012: Cath with Cutting balloon PTCA  LAD for ISR to LAD, EF 55%;  c. 12/2012 Cath/PCI: LAD stents patent, 80 ost Diag (jailed)->PTCA, LCX/RCA patent.  ? Essential hypertension   ? GI bleed   ? History of blood transfusion   ? was getting 4 units  ? Hyperlipidemia   ? Ischemic cardiomyopathy   ? a.  echo 08/06/11: dist ant wall, apical and septal and infero-apical HK, mild LVH, EF 40%, mild LAE, PASP 34, asc aorta mildly dilated, mild TR;  b.  Echo (3/13) showed recovery of LV systolic function with EF 54-00%, grade I diastolic dysfunction, mild MR.   ? Myocardial infarction [facility])   ? twice with NSTEMI 2012  ? Palpitations   ? Type 2 diabetes mellitus ([place])   ? ?Past Surgical History:  ?Procedure Laterality Date  ? ANTERIOR CERVICAL DECOMP/DISCECTOMY FUSION  in the 90s  ? CHOLECYSTECTOMY  03/27/2019  ? COLONOSCOPY  2021  ? COLONOSCOPY WITH ESOPHAGOGASTRODUODENOSCOPY (EGD)  10/2019  ? CORONARY ANGIOPLASTY  12/15/2012; 12/22/2012  ? CORONARY ANGIOPLASTY WITH STENT PLACEMENT  07/2011  ? "1" (12/22/2012)  ? CORONARY STENT INTERVENTION N/A 12/08/2021  ? Procedure: CORONARY STENT INTERVENTION;  Surgeon: Early Osmond, MD;  Location: [place] CV LAB;  Service: Cardiovascular;  Laterality: N/A;  ? GASTRIC BYPASS  12/21/2017  ? KNEE ARTHROSCOPY Left 12/31/2016  ? LEFT HEART CATH AND CORONARY ANGIOGRAPHY N/A 12/08/2021  ? Procedure: LEFT HEART CATH AND CORONARY ANGIOGRAPHY;  Surgeon: Early Osmond, MD;  Location: [place] [facility];  Service: Cardiovascular;  Laterality: N/A;  ? LEFT HEART CATHETERIZATION WITH CORONARY ANGIOGRAM N/A 08/06/2011  ? Procedure: LEFT HEART CATHETERIZATION WITH CORONARY ANGIOGRAM;  Surgeon: Burnell Blanks, MD;  Location: [facility] CATH LAB;  Service: Cardiovascular;  Laterality: N/A;  ? LEFT HEART CATHETERIZATION WITH CORONARY ANGIOGRAM N/A 12/15/2012  ? Procedure: LEFT HEART CATHETERIZATION WITH CORONARY ANGIOGRAM;  Surgeon: Sherren Mocha, MD;  Location: [facility] CATH LAB;  Service: Cardiovascular;  Laterality: N/A;  ? LEFT HEART CATHETERIZATION WITH CORONARY ANGIOGRAM N/A 12/22/2012  ? Procedure: LEFT HEART CATHETERIZATION WITH CORONARY ANGIOGRAM;  Surgeon: Sherren Mocha, MD;  Location: [facility] CATH LAB;  Service: Cardiovascular;  Laterality: N/A;  ? PERCUTANEOUS CORONARY STENT INTERVENTION (PCI-S) Right 12/15/2012  ? Procedure: PERCUTANEOUS CORONARY STENT INTERVENTION (PCI-S);  Surgeon: Sherren Mocha, MD;  Location: [facility] CATH LAB;  Service: Cardiovascular;  Laterality: Right;  ? ? ?Allergies ? ?Allergies  ?Allergen Reactions  ? Trulicity [Dulaglutide] Nausea And Vomiting  ? ? ?History of Present Illness  ?  ?Randy Pacer  Brooke Bonito. is a 71 y.o. male with a hx of CAD (s/p PCI of CAD and cutting balloon angioplasty for ISR and angioplasty diagonal 2014, 11/2021 DES to 1st diagonal), HTN, ICM, DM2, GERD  last seen while hospitalized. ? ?He is followed routinely with Dr. Domenic Polite.  He had an NSTEMI in 2012 with DES to the mid LAD.  Recurrent cath in 2014 with PTCA alone to ISR of LAD.  Cardiac catheterization 12/2012 with patent stents along LAD with jailed 80% ostial diagonal stenosis.  He has had ischemic cardiomyopathy dating back to  2012 with a EF 40% which improved to 55 to 60% in 2013.  He was last seen in clinic by Dr. Domenic Polite 11/20/2021 with stable NYHA II symptoms.  Updated echo 11/26/2021 revealed LVEF 45-50%, moderate LVH, grade 2 diastolic dysfunction, mild MR, moderate dilation of aortic root 43 mm. ? ?Admitted 12/05/2021 - 12/09/2021 after presenting with chest pain.  He had mildly elevated troponins.  He underwent cardiac catheterization 12/08/2021 with findings of high-grade restenosis of first diagonal stent and stent was placed.  Recommended continue aspirin indefinitely and Plavix for 1 year.  Echo revealed LVEF 35-40% with global hypokinesis. ? ?He presents today for follow-up his wife.  We reviewed cardiac testing including catheterization and echocardiogram in detail.  Notes catheterization site healing well with only mild bruising.  Since discharge reports no dyspnea nor chest pain.  He has been walking for exercise at the [employer] as well as in the wood shop.  He is excited to participate in cardiac rehab. ? ?EKGs/Labs/Other Studies Reviewed:  ? ?The following studies were reviewed today: ? ?Echo 11/26/21 ? ? 1. Left ventricular ejection fraction, by estimation, is 45 to 50%. The  ?left ventricle has mildly decreased function. The left ventricle  ?demonstrates global hypokinesis. There is moderate concentric left  ?ventricular hypertrophy. Left ventricular  ?diastolic parameters are consistent with Grade II diastolic dysfunction  ?(pseudonormalization). The average left ventricular global longitudinal  ?strain is -11.3 %. The global longitudinal strain is abnormal.  ? 2. Right ventricular systolic function is normal. The right ventricular  ?size is normal. There is normal pulmonary artery systolic pressure.  ? 3. Left atrial size was severely dilated.  ? 4. The mitral valve is grossly normal. Mild mitral valve regurgitation.  ? 5. The aortic valve is tricuspid. Aortic valve regurgitation is not  ?visualized.  ? 6. Aortic  dilatation noted. There is moderate dilatation of the aortic  ?root, measuring 43 mm.  ? ?Comparison(s): Prior images reviewed side by side. LVEF mildly reduced at  ?45-50% at this point. Aortic root is moderately dilated at 43 mm.  ? ?Echo 12/06/21 ? 1. Left ventricular ejection fraction, by estimation, is 35 to 40%. The  ?left ventricle has moderately decreased function. The left ventricle  ?demonstrates global hypokinesis. There is mild left ventricular  ?hypertrophy. The average left ventricular global  ? longitudinal strain is -10.1 %. The global longitudinal strain is  ?abnormal.  ? 2. Right ventricular systolic function is normal. The right ventricular  ?size is normal. Tricuspid regurgitation signal is inadequate for assessing  ?PA pressure.  ? 3. Left atrial size was mildly dilated.  ? 4. The mitral valve is grossly normal. Mild mitral valve regurgitation.  ? 5. The aortic valve is tricuspid. There is mild thickening of the aortic  ?valve.  ? 6. Aortic dilatation noted. There is moderate dilatation of the aortic  ?root, measuring 47 mm.  ? 7. The inferior vena cava  is normal in size with greater than 50%  ?respiratory variability, suggesting right atrial pressure of 3 mmHg.  ? ?Comparison(s): A prior study was performed on 11/26/21. Prior images  ?reviewed side by side. Compared to prior study, LVEF appears globally  ?reduced.  ? ?LHC 12/08/21 ?  ?  Prox LAD lesion is 10% stenosed. ?  1st Diag lesion is 99% stenosed. ?  A stent was successfully placed. ?  Post intervention, there is a 0% residual stenosis. ?  LV end diastolic pressure is normal. ?  ?1.  High-grade restenosis of the ostium of jailed first diagonal treated with 1 drug-eluting stent with POT and kissing balloon inflation in a reverse [place] configuration. ?2.  LVEDP of 7 mmHg. ?  ?Recommendation: Dual antiplatelet therapy for ideally 1 year and medical management of coronary artery disease. ?  ?EKG:  EKG is ordered today.  The ekg ordered today  demonstrates SB 56 bpm with 1st degree AV block with LBBB. Occasional PAC. ? ?Recent Labs: ?04/17/2021: TSH 1.11 ?12/07/2021: ALT 32; Magnesium 1.7 ?12/09/2021: BUN 7; Creatinine, Ser 0.72; Hemoglobin 11.5; Platelets 2

## 2021-12-23 ENCOUNTER — Telehealth (HOSPITAL_BASED_OUTPATIENT_CLINIC_OR_DEPARTMENT_OTHER): Payer: Self-pay

## 2021-12-23 DIAGNOSIS — I5022 Chronic systolic (congestive) heart failure: Secondary | ICD-10-CM

## 2021-12-23 LAB — CBC
Hematocrit: 41.7 % (ref 37.5–51.0)
Hemoglobin: 13.6 g/dL (ref 13.0–17.7)
MCH: 28.8 pg (ref 26.6–33.0)
MCHC: 32.6 g/dL (ref 31.5–35.7)
MCV: 88 fL (ref 79–97)
Platelets: 293 10*3/uL (ref 150–450)
RBC: 4.73 x10E6/uL (ref 4.14–5.80)
RDW: 13.4 % (ref 11.6–15.4)
WBC: 7.9 10*3/uL (ref 3.4–10.8)

## 2021-12-23 LAB — BASIC METABOLIC PANEL
BUN/Creatinine Ratio: 12 (ref 10–24)
BUN: 8 mg/dL (ref 8–27)
CO2: 26 mmol/L (ref 20–29)
Calcium: 9 mg/dL (ref 8.6–10.2)
Chloride: 105 mmol/L (ref 96–106)
Creatinine, Ser: 0.67 mg/dL — ABNORMAL LOW (ref 0.76–1.27)
Glucose: 126 mg/dL — ABNORMAL HIGH (ref 70–99)
Potassium: 4.6 mmol/L (ref 3.5–5.2)
Sodium: 145 mmol/L — ABNORMAL HIGH (ref 134–144)
eGFR: 100 mL/min/{1.73_m2} (ref >59)

## 2021-12-23 LAB — MAGNESIUM: Magnesium: 1.8 mg/dL (ref 1.6–2.3)

## 2021-12-23 MED ORDER — DAPAGLIFLOZIN PROPANEDIOL 10 MG PO TABS: 10.0000 mg | ORAL_TABLET | Freq: Every day | ORAL | 3 refills | Status: DC

## 2021-12-23 MED ORDER — SPIRONOLACTONE 25 MG PO TABS: 12.5000 mg | ORAL_TABLET | Freq: Every day | ORAL | 3 refills | Status: DC

## 2021-12-23 NOTE — Telephone Encounter (Addendum)
Results called to patient who verbalizes understanding!  ? ?Labs ordered and mailed, prescriptions updated and sent to pharmacy on file  ? ? ? ? ? ?----- Message from Loel Dubonnet, NP sent at 12/23/2021  7:52 AM EDT ----- ?Stable renal function.  Normal potassium.  Sodium mildly elevated which is not of concern.  Normal magnesium. CBC with no evidence of anemia nor infection.   ? ?Recommend start Spironolactone 12.'5mg'$  daily for optimization of heart failure medications with BMP in 2 weeks ? ? ? ?Loel Dubonnet, NP  Gerald Stabs, RN ?Received note from Dr. Kelton Pillar that okay to increase Farxiga to 10 mg daily.  This will get him to the optimized dose for heart failure therapy.  ? ?Please instruct to increase Farxiga to '10mg'$  daily (provide new Rx) as well as start Spironolactone 12.'5mg'$  QD.  ? ?

## 2022-01-01 DIAGNOSIS — L918 Other hypertrophic disorders of the skin: Secondary | ICD-10-CM | POA: Diagnosis not present

## 2022-01-01 DIAGNOSIS — L821 Other seborrheic keratosis: Secondary | ICD-10-CM | POA: Diagnosis not present

## 2022-01-01 DIAGNOSIS — D225 Melanocytic nevi of trunk: Secondary | ICD-10-CM | POA: Diagnosis not present

## 2022-01-01 DIAGNOSIS — Z85828 Personal history of other malignant neoplasm of skin: Secondary | ICD-10-CM | POA: Diagnosis not present

## 2022-01-01 DIAGNOSIS — L570 Actinic keratosis: Secondary | ICD-10-CM | POA: Diagnosis not present

## 2022-01-06 ENCOUNTER — Encounter (HOSPITAL_COMMUNITY): Payer: Self-pay

## 2022-01-06 ENCOUNTER — Encounter (HOSPITAL_COMMUNITY)
Admission: RE | Admit: 2022-01-06 | Discharge: 2022-01-06 | Disposition: A | Discharge status: Home / Self Care | Payer: PPO | Source: Ambulatory Visit | Attending: Cardiology | Admitting: Cardiology

## 2022-01-06 VITALS — BP 120/76 | HR 62

## 2022-01-06 DIAGNOSIS — I214 Non-ST elevation (NSTEMI) myocardial infarction: Secondary | ICD-10-CM | POA: Insufficient documentation

## 2022-01-06 DIAGNOSIS — Z955 Presence of coronary angioplasty implant and graft: Secondary | ICD-10-CM | POA: Insufficient documentation

## 2022-01-06 NOTE — Progress Notes (Signed)
Cardiac Individual Treatment Plan ? ?Patient Details  ?Name: Randy Castro. ?MRN: [id number] ?Date of Birth: [birth year] ?Referring Provider:   ?Flowsheet Row CARDIAC REHAB PHASE II ORIENTATION from 01/06/2022 in [place]  ?Referring Provider Dr. Louanne Belton  ? ?  ? ? ?Initial Encounter Date:  ?Flowsheet Row CARDIAC REHAB PHASE II ORIENTATION from 01/06/2022 in [place]  ?Date 01/06/22  ? ?  ? ? ?Visit Diagnosis: NSTEMI (non-ST elevated myocardial infarction) ([place]) ? ?Status post coronary artery stent placement ? ?Patient's Home Medications on Admission: ? ?Current Outpatient Medications:  ?  aspirin EC 81 MG tablet, Take 81 mg by mouth daily., Disp: , Rfl:  ?  atorvastatin (LIPITOR) 80 MG tablet, TAKE ONE TABLET BY MOUTH EVERYDAY AT BEDTIME (Patient taking differently: Take 80 mg by mouth daily.), Disp: 90 tablet, Rfl: 1 ?  carvedilol (COREG) 12.5 MG tablet, TAKE ONE TABLET BY MOUTH EVERY MORNING and TAKE ONE TABLET BY MOUTH EVERYDAY AT BEDTIME (Patient taking differently: Take 12.5 mg by mouth 2 (two) times daily with a meal.), Disp: 180 tablet, Rfl: 1 ?  clopidogrel (PLAVIX) 75 MG tablet, Take 1 tablet (75 mg total) by mouth daily., Disp: 90 tablet, Rfl: 3 ?  dapagliflozin propanediol (FARXIGA) 10 MG TABS tablet, Take 1 tablet (10 mg total) by mouth daily., Disp: 90 tablet, Rfl: 3 ?  FEROSUL 325 (65 Fe) MG tablet, TAKE 1 TABLET BY MOUTH DAILY (Patient taking differently: Take 325 mg by mouth daily.), Disp: 30 tablet, Rfl: 3 ?  FLUoxetine (PROZAC) 40 MG capsule, Take 1 capsule (40 mg total) by mouth daily., Disp: 30 capsule, Rfl: 10 ?  insulin glargine (LANTUS SOLOSTAR) 100 UNIT/ML Solostar Pen, Inject 20 Units into the skin daily. (Patient taking differently: Inject 18 Units into the skin daily.), Disp: 30 mL, Rfl: 3 ?  magnesium oxide (MAG-OX) 400 MG tablet, TAKE ONE TABLET BY MOUTH EVERY MORNING and TAKE ONE TABLET BY MOUTH EVERYDAY AT BEDTIME (Patient taking  differently: Take 400 mg by mouth 2 (two) times daily.), Disp: 180 tablet, Rfl: 1 ?  metFORMIN (GLUCOPHAGE) 1000 MG tablet, Take 1 tablet (1,000 mg total) by mouth 2 (two) times daily with a meal., Disp: 180 tablet, Rfl: 3 ?  Multiple Vitamins-Minerals (MULTIVITAMIN WITH MINERALS) tablet, Take 1 tablet by mouth daily., Disp: , Rfl:  ?  nitroGLYCERIN (NITROSTAT) 0.4 MG SL tablet, Place 1 tablet (0.4 mg total) under the tongue every 5 (five) minutes as needed for chest pain., Disp: 25 tablet, Rfl: 3 ?  pantoprazole (PROTONIX) 40 MG tablet, TAKE ONE TABLET BY MOUTH EVERY MORNING and TAKE ONE TABLET BY MOUTH EVERYDAY AT BEDTIME (Patient taking differently: Take 40 mg by mouth 2 (two) times daily.), Disp: 180 tablet, Rfl: 1 ?  Potassium 99 MG TABS, Take 99 mg by mouth daily., Disp: , Rfl:  ?  primidone (MYSOLINE) 50 MG tablet, TAKE THREE TABLETS BY MOUTH EVERY MORNING and TAKE TWO TABLETS BY MOUTH EVERYDAY AT BEDTIME (Patient taking differently: Take 100-150 mg by mouth See admin instructions. TAKE THREE TABLETS BY MOUTH EVERY MORNING and TAKE TWO TABLETS BY MOUTH EVERYDAY AT BEDTIME), Disp: 450 tablet, Rfl: 1 ?  sacubitril-valsartan (ENTRESTO) 24-26 MG, Take 1 tablet by mouth 2 (two) times daily., Disp: 180 tablet, Rfl: 3 ?  spironolactone (ALDACTONE) 25 MG tablet, Take 0.5 tablets (12.5 mg total) by mouth daily., Disp: 45 tablet, Rfl: 3 ?  vitamin B-12 (CYANOCOBALAMIN) 1000 MCG tablet, Take 1,000 mcg by mouth daily., Disp: ,  Rfl:  ?  Blood Glucose Monitoring Suppl (ONE TOUCH ULTRA 2) w/Device KIT, by Does not apply route., Disp: , Rfl:  ?  Continuous Blood Gluc Sensor (FREESTYLE LIBRE 2 SENSOR) MISC, 1 Device by Does not apply route every 14 (fourteen) days., Disp: 6 each, Rfl: 3 ?  glucose blood (ONETOUCH ULTRA) test strip, Check blood sugar 2-3 times daily.  Dx Code: E11.9, Disp: 300 each, Rfl: 12 ? ?Past Medical History: ?Past Medical History:  ?Diagnosis Date  ? Anxiety   ? Arthritis   ? BCC (basal cell carcinoma  of skin) 12/2020  ? CAD (coronary artery disease)   ? a. NSTEMI 12/12: EF 40-45%. ZOX:[id number] balloon PTCA + Promus DES x 1 to mid LAD;  b. 11/2012: Cath with Cutting balloon PTCA  LAD for ISR to LAD, EF 55%;  c. 12/2012 Cath/PCI: LAD stents patent, 80 ost Diag (jailed)->PTCA, LCX/RCA patent.  ? Essential hypertension   ? GI bleed   ? History of blood transfusion   ? was getting 4 units  ? Hyperlipidemia   ? Ischemic cardiomyopathy   ? a.  echo 08/06/11: dist ant wall, apical and septal and infero-apical HK, mild LVH, EF 40%, mild LAE, PASP 34, asc aorta mildly dilated, mild TR;  b.  Echo (3/13) showed recovery of LV systolic function with EF 09-81%, grade I diastolic dysfunction, mild MR.   ? Myocardial infarction [facility])   ? twice with NSTEMI 2012  ? Palpitations   ? Type 2 diabetes mellitus ([place])   ? ? ?Tobacco Use: ?Social History  ? ?Tobacco Use  ?Smoking Status Former  ? Packs/day: 1.00  ? Years: 25.00  ? Pack years: 25.00  ? Types: Cigarettes  ? Quit date: 04/12/1992  ? Years since quitting: 29.7  ?Smokeless Tobacco Never  ? ? ?Labs: ?Review Flowsheet   ? ?  ?  Latest Ref Rng & Units 01/07/2021 04/17/2021 07/15/2021 11/18/2021  ?Labs for ITP Cardiac and Pulmonary Rehab  ?Cholestrol 0 - 200 mg/dL  110      ?LDL (calc) 0 - 99 mg/dL  46      ?HDL-C >39.00 mg/dL  43.80      ?Trlycerides 0.0 - 149.0 mg/dL  102.0      ?Hemoglobin A1c 4.0 - 5.6 % 8.4    9.1   7.8    ? ?  11/27/2021  ?Labs for ITP Cardiac and Pulmonary Rehab  ?Cholestrol 110    ?LDL (calc) 40    ?HDL-C 45.40    ?Trlycerides 121.0    ?Hemoglobin A1c   ?  ?  ?  ? ? ?Capillary Blood Glucose: ?Lab Results  ?Component Value Date  ? GLUCAP 177 (H) 12/09/2021  ? GLUCAP 169 (H) 12/08/2021  ? GLUCAP 240 (H) 12/08/2021  ? GLUCAP 136 (H) 12/08/2021  ? GLUCAP 157 (H) 12/08/2021  ? ? POCT Glucose   ? ? [place] Name 01/06/22 1914  ?  ?  ?  ?  ?  ? POCT Blood Glucose  ? Pre-Exercise 147 mg/dL      ? ?  ?  ? ?  ? ? ?Exercise Target Goals: ?Exercise Program Goal: ?Individual  exercise prescription set using results from initial 6 min walk test and THRR while considering  patient?s activity barriers and safety.  ? ?Exercise Prescription Goal: ?Starting with aerobic activity 30 plus minutes a day, 3 days per week for initial exercise prescription. Provide home exercise prescription and guidelines that participant acknowledges understanding prior to discharge. ? ?  Activity Barriers & Risk Stratification: ? Activity Barriers & Cardiac Risk Stratification - 01/06/22 0833   ? ?  ? Activity Barriers & Cardiac Risk Stratification  ? Activity Barriers Arthritis;Joint Problems;Deconditioning;Balance Concerns;History of Falls   ? Cardiac Risk Stratification High   ? ?  ?  ? ?  ? ? ?6 Minute Walk: ? 6 Minute Walk   ? ? [place] Name 01/06/22 0834  ?  ?  ?  ? 6 Minute Walk  ? Phase Initial    ? Distance 1000 feet    ? Walk Time 6 minutes    ? # of Rest Breaks 0    ? MPH 1.89    ? METS 2.18    ? RPE 13    ? VO2 Peak 63    ? Symptoms Yes (comment)    ? Comments L knee pain 3/10    ? Resting HR 62 bpm    ? Resting BP 120/76    ? Resting Oxygen Saturation  98 %    ? Exercise Oxygen Saturation  during 6 min walk 98 %    ? Max Ex. HR 69 bpm    ? Max Ex. BP 130/70    ? 2 Minute Post BP 120/66    ? ?  ?  ? ?  ? ? ?Oxygen Initial Assessment: ? ? ?Oxygen Re-Evaluation: ? ? ?Oxygen Discharge (Final Oxygen Re-Evaluation): ? ? ?Initial Exercise Prescription: ? Initial Exercise Prescription - 01/06/22 0800   ? ?  ? Date of Initial Exercise RX and Referring Provider  ? Date 01/06/22   ? Referring Provider Dr. Louanne Belton   ? Expected Discharge Date 04/03/22   ?  ? Treadmill  ? MPH 1   ? Grade 0   ? Minutes 17   ?  ? Recumbant Elliptical  ? Level 1   ? RPM 40   ? Minutes 22   ?  ? Prescription Details  ? Frequency (times per week) 3   ? Duration Progress to 30 minutes of continuous aerobic without signs/symptoms of physical distress   ?  ? Intensity  ? THRR 40-80% of Max Heartrate 60-120   ? Ratings of Perceived Exertion  11-13   ?  ? Resistance Training  ? Training Prescription Yes   ? Weight 4   ? Reps 10-15   ? ?  ?  ? ?  ? ? ?Perform Capillary Blood Glucose checks as needed. ? ?Exercise Prescription Changes: ? ? ?Exercise Comments:

## 2022-01-12 ENCOUNTER — Encounter (HOSPITAL_COMMUNITY)
Admission: RE | Admit: 2022-01-12 | Discharge: 2022-01-12 | Disposition: A | Discharge status: Home / Self Care | Payer: PPO | Source: Ambulatory Visit | Attending: Cardiology | Admitting: Cardiology

## 2022-01-12 VITALS — Wt 212.3 lb

## 2022-01-12 DIAGNOSIS — Z955 Presence of coronary angioplasty implant and graft: Secondary | ICD-10-CM

## 2022-01-12 DIAGNOSIS — I214 Non-ST elevation (NSTEMI) myocardial infarction: Secondary | ICD-10-CM | POA: Diagnosis not present

## 2022-01-12 NOTE — Progress Notes (Signed)
Daily Session Note  Patient Details  Name: Randy Castro. MRN: [id number] Date of Birth: 1950-12-28 Referring Provider:   Flowsheet Row CARDIAC REHAB PHASE II ORIENTATION from 01/06/2022 in [place]  Referring Provider Dr. Louanne Belton       Encounter Date: 01/12/2022  Check In:  Session Check In - 01/12/22 0930       Check-In   Supervising physician immediately available to respond to emergencies [facility] MD immediately available    Physician(s) Dr. Alda Lea    Location [facility]    Staff Present Geanie Cooley, RN;Heather Otho Ket, BS, Exercise Physiologist;Daphyne Hassell Done, RN, BSN    Virtual Visit No    Medication changes reported     No    Fall or balance concerns reported    Yes    Comments He has fallen twice due to dizziness and vertigo. He also has diabetic neuropathy which alters his balance as well.    Tobacco Cessation No Change    Warm-up and Cool-down Performed as group-led instruction    Resistance Training Performed Yes    VAD Patient? No    PAD/SET Patient? No      Pain Assessment   Currently in Pain? No/denies    Pain Score 0-No pain    Multiple Pain Sites No             Capillary Blood Glucose: No results found for this or any previous visit (from the past 24 hour(s)).    Social History   Tobacco Use  Smoking Status Former   Packs/day: 1.00   Years: 25.00   Pack years: 25.00   Types: Cigarettes   Quit date: 04/12/1992   Years since quitting: 29.7  Smokeless Tobacco Never    Goals Met:  Independence with exercise equipment Exercise tolerated well No report of concerns or symptoms today Strength training completed today  Goals Unmet:  Not Applicable  Comments: check out @ 10:30am   Dr. Carlyle Dolly is Medical Director for [facility]

## 2022-01-14 ENCOUNTER — Encounter (HOSPITAL_COMMUNITY)
Admission: RE | Admit: 2022-01-14 | Discharge: 2022-01-14 | Disposition: A | Discharge status: Home / Self Care | Payer: PPO | Source: Ambulatory Visit | Attending: Cardiology | Admitting: Cardiology

## 2022-01-14 ENCOUNTER — Telehealth: Payer: PPO

## 2022-01-14 ENCOUNTER — Encounter (HOSPITAL_COMMUNITY): Payer: PPO

## 2022-01-14 DIAGNOSIS — I214 Non-ST elevation (NSTEMI) myocardial infarction: Secondary | ICD-10-CM | POA: Diagnosis not present

## 2022-01-14 DIAGNOSIS — Z955 Presence of coronary angioplasty implant and graft: Secondary | ICD-10-CM

## 2022-01-14 NOTE — Progress Notes (Signed)
Cardiac Individual Treatment Plan  Patient Details  Name: Randy Castro. MRN: [id number] Date of Birth: [birth year] Referring Provider:   Flowsheet Row CARDIAC REHAB PHASE II ORIENTATION from 01/06/2022 in [place]  Referring Provider Dr. Louanne Belton       Initial Encounter Date:  Flowsheet Row CARDIAC REHAB PHASE II ORIENTATION from 01/06/2022 in [place]  Date 01/06/22       Visit Diagnosis: NSTEMI (non-ST elevated myocardial infarction) [facility])  Status post coronary artery stent placement  Patient's Home Medications on Admission:  Current Outpatient Medications:    aspirin EC 81 MG tablet, Take 81 mg by mouth daily., Disp: , Rfl:    atorvastatin (LIPITOR) 80 MG tablet, TAKE ONE TABLET BY MOUTH EVERYDAY AT BEDTIME (Patient taking differently: Take 80 mg by mouth daily.), Disp: 90 tablet, Rfl: 1   Blood Glucose Monitoring Suppl (ONE TOUCH ULTRA 2) w/Device KIT, by Does not apply route., Disp: , Rfl:    carvedilol (COREG) 12.5 MG tablet, TAKE ONE TABLET BY MOUTH EVERY MORNING and TAKE ONE TABLET BY MOUTH EVERYDAY AT BEDTIME (Patient taking differently: Take 12.5 mg by mouth 2 (two) times daily with a meal.), Disp: 180 tablet, Rfl: 1   clopidogrel (PLAVIX) 75 MG tablet, Take 1 tablet (75 mg total) by mouth daily., Disp: 90 tablet, Rfl: 3   Continuous Blood Gluc Sensor (FREESTYLE LIBRE 2 SENSOR) MISC, 1 Device by Does not apply route every 14 (fourteen) days., Disp: 6 each, Rfl: 3   dapagliflozin propanediol (FARXIGA) 10 MG TABS tablet, Take 1 tablet (10 mg total) by mouth daily., Disp: 90 tablet, Rfl: 3   FEROSUL 325 (65 Fe) MG tablet, TAKE 1 TABLET BY MOUTH DAILY (Patient taking differently: Take 325 mg by mouth daily.), Disp: 30 tablet, Rfl: 3   FLUoxetine (PROZAC) 40 MG capsule, Take 1 capsule (40 mg total) by mouth daily., Disp: 30 capsule, Rfl: 10   glucose blood (ONETOUCH ULTRA) test strip, Check blood sugar 2-3 times daily.  Dx Code:  E11.9, Disp: 300 each, Rfl: 12   insulin glargine (LANTUS SOLOSTAR) 100 UNIT/ML Solostar Pen, Inject 20 Units into the skin daily. (Patient taking differently: Inject 18 Units into the skin daily.), Disp: 30 mL, Rfl: 3   magnesium oxide (MAG-OX) 400 MG tablet, TAKE ONE TABLET BY MOUTH EVERY MORNING and TAKE ONE TABLET BY MOUTH EVERYDAY AT BEDTIME (Patient taking differently: Take 400 mg by mouth 2 (two) times daily.), Disp: 180 tablet, Rfl: 1   metFORMIN (GLUCOPHAGE) 1000 MG tablet, Take 1 tablet (1,000 mg total) by mouth 2 (two) times daily with a meal., Disp: 180 tablet, Rfl: 3   Multiple Vitamins-Minerals (MULTIVITAMIN WITH MINERALS) tablet, Take 1 tablet by mouth daily., Disp: , Rfl:    nitroGLYCERIN (NITROSTAT) 0.4 MG SL tablet, Place 1 tablet (0.4 mg total) under the tongue every 5 (five) minutes as needed for chest pain., Disp: 25 tablet, Rfl: 3   pantoprazole (PROTONIX) 40 MG tablet, TAKE ONE TABLET BY MOUTH EVERY MORNING and TAKE ONE TABLET BY MOUTH EVERYDAY AT BEDTIME (Patient taking differently: Take 40 mg by mouth 2 (two) times daily.), Disp: 180 tablet, Rfl: 1   Potassium 99 MG TABS, Take 99 mg by mouth daily., Disp: , Rfl:    primidone (MYSOLINE) 50 MG tablet, TAKE THREE TABLETS BY MOUTH EVERY MORNING and TAKE TWO TABLETS BY MOUTH EVERYDAY AT BEDTIME (Patient taking differently: Take 100-150 mg by mouth See admin instructions. TAKE THREE TABLETS BY MOUTH EVERY  MORNING and TAKE TWO TABLETS BY MOUTH EVERYDAY AT BEDTIME), Disp: 450 tablet, Rfl: 1   sacubitril-valsartan (ENTRESTO) 24-26 MG, Take 1 tablet by mouth 2 (two) times daily., Disp: 180 tablet, Rfl: 3   spironolactone (ALDACTONE) 25 MG tablet, Take 0.5 tablets (12.5 mg total) by mouth daily., Disp: 45 tablet, Rfl: 3   vitamin B-12 (CYANOCOBALAMIN) 1000 MCG tablet, Take 1,000 mcg by mouth daily., Disp: , Rfl:   Past Medical History: Past Medical History:  Diagnosis Date   Anxiety    Arthritis    BCC (basal cell carcinoma of skin)  12/2020   CAD (coronary artery disease)    a. NSTEMI 12/12: EF 40-45%. [id number] balloon PTCA + Promus DES x 1 to mid LAD;  b. 11/2012: Cath with Cutting balloon PTCA  LAD for ISR to LAD, EF 55%;  c. 12/2012 Cath/PCI: LAD stents patent, 80 ost Diag (jailed)->PTCA, LCX/RCA patent.   Essential hypertension    GI bleed    History of blood transfusion    was getting 4 units   Hyperlipidemia    Ischemic cardiomyopathy    a.  echo 08/06/11: dist ant wall, apical and septal and infero-apical HK, mild LVH, EF 40%, mild LAE, PASP 34, asc aorta mildly dilated, mild TR;  b.  Echo (3/13) showed recovery of LV systolic function with EF 44-96%, grade I diastolic dysfunction, mild MR.    Myocardial infarction [facility]    twice with NSTEMI 2012   Palpitations    Type 2 diabetes mellitus ([place])     Tobacco Use: Social History   Tobacco Use  Smoking Status Former   Packs/day: 1.00   Years: 25.00   Pack years: 25.00   Types: Cigarettes   Quit date: 04/12/1992   Years since quitting: 29.7  Smokeless Tobacco Never    Labs: Review Flowsheet        Latest Ref Rng & Units 01/07/2021 04/17/2021 07/15/2021 11/18/2021  Labs for ITP Cardiac and Pulmonary Rehab  Cholestrol 0 - 200 mg/dL  110      LDL (calc) 0 - 99 mg/dL  46      HDL-C >39.00 mg/dL  43.80      Trlycerides 0.0 - 149.0 mg/dL  102.0      Hemoglobin A1c 4.0 - 5.6 % 8.4    9.1   7.8       11/27/2021  Labs for ITP Cardiac and Pulmonary Rehab  Cholestrol 110    LDL (calc) 40    HDL-C 45.40    Trlycerides 121.0    Hemoglobin A1c           Capillary Blood Glucose: Lab Results  Component Value Date   GLUCAP 177 (H) 12/09/2021   GLUCAP 169 (H) 12/08/2021   GLUCAP 240 (H) 12/08/2021   GLUCAP 136 (H) 12/08/2021   GLUCAP 157 (H) 12/08/2021    POCT Glucose     Row Name 01/06/22 0819             POCT Blood Glucose   Pre-Exercise 147 mg/dL                Exercise Target Goals: Exercise Program Goal: Individual exercise  prescription set using results from initial 6 min walk test and THRR while considering  patient's activity barriers and safety.   Exercise Prescription Goal: Starting with aerobic activity 30 plus minutes a day, 3 days per week for initial exercise prescription. Provide home exercise prescription and guidelines that participant acknowledges understanding prior to  discharge.  Activity Barriers & Risk Stratification:  Activity Barriers & Cardiac Risk Stratification - 01/06/22 0833       Activity Barriers & Cardiac Risk Stratification   Activity Barriers Arthritis;Joint Problems;Deconditioning;Balance Concerns;History of Falls    Cardiac Risk Stratification High             6 Minute Walk:  6 Minute Walk     Row Name 01/06/22 0834         6 Minute Walk   Phase Initial     Distance 1000 feet     Walk Time 6 minutes     # of Rest Breaks 0     MPH 1.89     METS 2.18     RPE 13     VO2 Peak 63     Symptoms Yes (comment)     Comments L knee pain 3/10     Resting HR 62 bpm     Resting BP 120/76     Resting Oxygen Saturation  98 %     Exercise Oxygen Saturation  during 6 min walk 98 %     Max Ex. HR 69 bpm     Max Ex. BP 130/70     2 Minute Post BP 120/66              Oxygen Initial Assessment:   Oxygen Re-Evaluation:   Oxygen Discharge (Final Oxygen Re-Evaluation):   Initial Exercise Prescription:  Initial Exercise Prescription - 01/06/22 0800       Date of Initial Exercise RX and Referring Provider   Date 01/06/22    Referring Provider Dr. Louanne Belton    Expected Discharge Date 04/03/22      Treadmill   MPH 1    Grade 0    Minutes 17      Recumbant Elliptical   Level 1    RPM 40    Minutes 22      Prescription Details   Frequency (times per week) 3    Duration Progress to 30 minutes of continuous aerobic without signs/symptoms of physical distress      Intensity   THRR 40-80% of Max Heartrate 60-120    Ratings of Perceived Exertion 11-13       Resistance Training   Training Prescription Yes    Weight 4    Reps 10-15             Perform Capillary Blood Glucose checks as needed.  Exercise Prescription Changes:   Exercise Prescription Changes     Row Name 01/12/22 1100             Response to Exercise   Blood Pressure (Admit) 120/56       Blood Pressure (Exercise) 130/60       Blood Pressure (Exit) 120/58       Heart Rate (Admit) 61 bpm       Heart Rate (Exercise) 71 bpm       Heart Rate (Exit) 63 bpm       Rating of Perceived Exertion (Exercise) 12       Duration Continue with 30 min of aerobic exercise without signs/symptoms of physical distress.       Intensity THRR unchanged         Progression   Progression Continue to progress workloads to maintain intensity without signs/symptoms of physical distress.         Resistance Training   Training Prescription Yes       Weight  3       Reps 10-15       Time 10 Minutes         Treadmill   MPH 1       Grade 0       Minutes 17       METs 1.77         Recumbant Elliptical   Level 1       RPM 42       Minutes 22       METs 2.3                Exercise Comments:   Exercise Goals and Review:   Exercise Goals     Row Name 01/12/22 1142             Exercise Goals   Increase Physical Activity Yes       Intervention Provide advice, education, support and counseling about physical activity/exercise needs.;Develop an individualized exercise prescription for aerobic and resistive training based on initial evaluation findings, risk stratification, comorbidities and participant's personal goals.       Expected Outcomes Short Term: Attend rehab on a regular basis to increase amount of physical activity.;Long Term: Add in home exercise to make exercise part of routine and to increase amount of physical activity.;Long Term: Exercising regularly at least 3-5 days a week.       Increase Strength and Stamina Yes       Intervention Provide advice,  education, support and counseling about physical activity/exercise needs.;Develop an individualized exercise prescription for aerobic and resistive training based on initial evaluation findings, risk stratification, comorbidities and participant's personal goals.       Expected Outcomes Short Term: Increase workloads from initial exercise prescription for resistance, speed, and METs.;Short Term: Perform resistance training exercises routinely during rehab and add in resistance training at home;Long Term: Improve cardiorespiratory fitness, muscular endurance and strength as measured by increased METs and functional capacity (6MWT)       Able to understand and use rate of perceived exertion (RPE) scale Yes       Intervention Provide education and explanation on how to use RPE scale       Expected Outcomes Short Term: Able to use RPE daily in rehab to express subjective intensity level;Long Term:  Able to use RPE to guide intensity level when exercising independently       Knowledge and understanding of Target Heart Rate Range (THRR) Yes       Intervention Provide education and explanation of THRR including how the numbers were predicted and where they are located for reference       Expected Outcomes Short Term: Able to state/look up THRR;Long Term: Able to use THRR to govern intensity when exercising independently;Short Term: Able to use daily as guideline for intensity in rehab       Able to check pulse independently Yes       Intervention Provide education and demonstration on how to check pulse in carotid and radial arteries.;Review the importance of being able to check your own pulse for safety during independent exercise       Expected Outcomes Short Term: Able to explain why pulse checking is important during independent exercise;Long Term: Able to check pulse independently and accurately       Understanding of Exercise Prescription Yes       Intervention Provide education, explanation, and written  materials on patient's individual exercise prescription  Expected Outcomes Short Term: Able to explain program exercise prescription;Long Term: Able to explain home exercise prescription to exercise independently                Exercise Goals Re-Evaluation :  Exercise Goals Re-Evaluation     [place] Name 01/12/22 1151             Exercise Goal Re-Evaluation   Exercise Goals Review Increase Physical Activity;Increase Strength and Stamina;Able to understand and use rate of perceived exertion (RPE) scale;Knowledge and understanding of Target Heart Rate Range (THRR);Able to check pulse independently;Understanding of Exercise Prescription       Comments Pt has completed his first session of cardiac rehab. He is motviated amd ready to improve his strength. He is currently exercising at 2.3 METs on the ellp. Will continue to monitor and progress as able.       Expected Outcomes Through exercise at home and at rehab, the patient will meet their stated goals.                 Discharge Exercise Prescription (Final Exercise Prescription Changes):  Exercise Prescription Changes - 01/12/22 1100       Response to Exercise   Blood Pressure (Admit) 120/56    Blood Pressure (Exercise) 130/60    Blood Pressure (Exit) 120/58    Heart Rate (Admit) 61 bpm    Heart Rate (Exercise) 71 bpm    Heart Rate (Exit) 63 bpm    Rating of Perceived Exertion (Exercise) 12    Duration Continue with 30 min of aerobic exercise without signs/symptoms of physical distress.    Intensity THRR unchanged      Progression   Progression Continue to progress workloads to maintain intensity without signs/symptoms of physical distress.      Resistance Training   Training Prescription Yes    Weight 3    Reps 10-15    Time 10 Minutes      Treadmill   MPH 1    Grade 0    Minutes 17    METs 1.77      Recumbant Elliptical   Level 1    RPM 42    Minutes 22    METs 2.3             Nutrition:   Target Goals: Understanding of nutrition guidelines, daily intake of sodium <1536m, cholesterol <2056m calories 30% from fat and 7% or less from saturated fats, daily to have 5 or more servings of fruits and vegetables.  Biometrics:    Nutrition Therapy Plan and Nutrition Goals:  Nutrition Therapy & Goals - 01/12/22 0911       Personal Nutrition Goals   Comments Patient scored 61 on his diet assessment. We offer 2 educational sessions on heart healthy nutrition with handouts.      Intervention Plan   Intervention Nutrition handout(s) given to patient.    Expected Outcomes Short Term Goal: Understand basic principles of dietary content, such as calories, fat, sodium, cholesterol and nutrients.             Nutrition Assessments:  Nutrition Assessments - 01/06/22 0809       MEDFICTS Scores   Pre Score 61            MEDIFICTS Score Key: ?70 Need to make dietary changes  40-70 Heart Healthy Diet ? 40 Therapeutic Level Cholesterol Diet   Picture Your Plate Scores: <4<97nhealthy dietary pattern with much room for improvement. 41-50 Dietary pattern unlikely  to meet recommendations for good health and room for improvement. 51-60 More healthful dietary pattern, with some room for improvement.  >60 Healthy dietary pattern, although there may be some specific behaviors that could be improved.    Nutrition Goals Re-Evaluation:   Nutrition Goals Discharge (Final Nutrition Goals Re-Evaluation):   Psychosocial: Target Goals: Acknowledge presence or absence of significant depression and/or stress, maximize coping skills, provide positive support system. Participant is able to verbalize types and ability to use techniques and skills needed for reducing stress and depression.  Initial Review & Psychosocial Screening:  Initial Psych Review & Screening - 01/06/22 0837       Initial Review   Current issues with Current Anxiety/Panic      Family Dynamics   Good Support  System? Yes    Comments His wife and two children are his support system.      Barriers   Psychosocial barriers to participate in program There are no identifiable barriers or psychosocial needs.      Screening Interventions   Interventions Encouraged to exercise    Expected Outcomes Long Term goal: The participant improves quality of Life and PHQ9 Scores as seen by post scores and/or verbalization of changes;Short Term goal: Identification and review with participant of any Quality of Life or Depression concerns found by scoring the questionnaire.             Quality of Life Scores:  Quality of Life - 01/06/22 0837       Quality of Life   Select Quality of Life      Quality of Life Scores   Health/Function Pre 25.2 %    Socioeconomic Pre 28.29 %    Psych/Spiritual Pre 30 %    Family Pre 27.6 %    GLOBAL Pre 27.18 %            Scores of 19 and below usually indicate a poorer quality of life in these areas.  A difference of  2-3 points is a clinically meaningful difference.  A difference of 2-3 points in the total score of the Quality of Life Index has been associated with significant improvement in overall quality of life, self-image, physical symptoms, and general health in studies assessing change in quality of life.  PHQ-9: Review Flowsheet        01/06/2022 11/27/2021 10/21/2021 07/23/2021 06/02/2021  Depression screen PHQ 2/9  Decreased Interest 0 0 1 0 0  Down, Depressed, Hopeless 0 0 0 0 0  PHQ - 2 Score 0 0 1 0 0  Altered sleeping 0  1    Tired, decreased energy 2  2    Change in appetite 1  1    Feeling bad or failure about yourself  0  0    Trouble concentrating 0  1    Moving slowly or fidgety/restless 0  0    Suicidal thoughts 0  0    PHQ-9 Score 3  6    Difficult doing work/chores Somewhat difficult  Somewhat difficult           Interpretation of Total Score  Total Score Depression Severity:  1-4 = Minimal depression, 5-9 = Mild depression, 10-14  = Moderate depression, 15-19 = Moderately severe depression, 20-27 = Severe depression   Psychosocial Evaluation and Intervention:  Psychosocial Evaluation - 01/06/22 0904       Psychosocial Evaluation & Interventions   Interventions Encouraged to exercise with the program and follow exercise prescription    Comments  Pt has no barriers to participating in CR. He is treated for anxiety with fluoxetine 40 mg. He has been treated for anxiety for several years now, and this medication helps him and that his anxiety is under control. He has no other identifiable psychosocial issues. 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 He also reports not having much of an appetite. He underwent a gastric bypass surgery several years ago and lost over 100 lbs. He reports that his appetite is small due to this. He reports that he has a good support system with his wife, children, and grandchildren. He lives with his wife, and his the rest of his family lives close by. He states that his goals while in the program are to improve his strength and stamina. He is eager to start the program.    Expected Outcomes Pt's anxiety will continue to be medically managed, and he will have no other identifiable psychosocial issues.    Continue Psychosocial Services  No Follow up required             Psychosocial Re-Evaluation:  Psychosocial Re-Evaluation     [place] Name 01/12/22 [phone]             Psychosocial Re-Evaluation   Current issues with Current Anxiety/Panic       Comments Patient is new to the program. He plans to start today. His anxiety is managed with Fluoxetine. We will continue to monitor his progress.       Expected Outcomes Patient will continue to have no psychosocial barriers identified and his anxiety will continue to be managed.       Interventions Stress management education;Encouraged to attend Cardiac Rehabilitation for the exercise;Relaxation  education       Continue Psychosocial Services  No Follow up required                Psychosocial Discharge (Final Psychosocial Re-Evaluation):  Psychosocial Re-Evaluation - 01/12/22 3716       Psychosocial Re-Evaluation   Current issues with Current Anxiety/Panic    Comments Patient is new to the program. He plans to start today. His anxiety is managed with Fluoxetine. We will continue to monitor his progress.    Expected Outcomes Patient will continue to have no psychosocial barriers identified and his anxiety will continue to be managed.    Interventions Stress management education;Encouraged to attend Cardiac Rehabilitation for the exercise;Relaxation education    Continue Psychosocial Services  No Follow up required             Vocational Rehabilitation: Provide vocational rehab assistance to qualifying candidates.   Vocational Rehab Evaluation & Intervention:  Vocational Rehab - 01/06/22 0808       Initial Vocational Rehab Evaluation & Intervention   Assessment shows need for Vocational Rehabilitation No      Vocational Rehab Re-Evaulation   Comments He is retired and not interested in returning to work             Education: Education Goals: Education classes will be provided on a weekly basis, covering required topics. Participant will state understanding/return demonstration of topics presented.  Learning Barriers/Preferences:  Learning Barriers/Preferences - 01/06/22 0840       Learning Barriers/Preferences   Learning Barriers None    Learning Preferences Skilled Demonstration             Education Topics: Hypertension, Hypertension Reduction -Define heart disease and high  blood pressure. Discus how high blood pressure affects the body and ways to reduce high blood pressure.   Exercise and Your Heart -Discuss why it is important to exercise, the FITT principles of exercise, normal and abnormal responses to exercise, and how to exercise  safely.   Angina -Discuss definition of angina, causes of angina, treatment of angina, and how to decrease risk of having angina.   Cardiac Medications -Review what the following cardiac medications are used for, how they affect the body, and side effects that may occur when taking the medications.  Medications include Aspirin, Beta blockers, calcium channel blockers, ACE Inhibitors, angiotensin receptor blockers, diuretics, digoxin, and antihyperlipidemics.   Congestive Heart Failure -Discuss the definition of CHF, how to live with CHF, the signs and symptoms of CHF, and how keep track of weight and sodium intake.   Heart Disease and Intimacy -Discus the effect sexual activity has on the heart, how changes occur during intimacy as we age, and safety during sexual activity.   Smoking Cessation / COPD -Discuss different methods to quit smoking, the health benefits of quitting smoking, and the definition of COPD.   Nutrition I: Fats -Discuss the types of cholesterol, what cholesterol does to the heart, and how cholesterol levels can be controlled.   Nutrition II: Labels -Discuss the different components of food labels and how to read food label   Heart Parts/Heart Disease and PAD -Discuss the anatomy of the heart, the pathway of blood circulation through the heart, and these are affected by heart disease.   Stress I: Signs and Symptoms -Discuss the causes of stress, how stress may lead to anxiety and depression, and ways to limit stress.   Stress II: Relaxation -Discuss different types of relaxation techniques to limit stress.   Warning Signs of Stroke / TIA -Discuss definition of a stroke, what the signs and symptoms are of a stroke, and how to identify when someone is having stroke.   Knowledge Questionnaire Score:  Knowledge Questionnaire Score - 01/06/22 0808       Knowledge Questionnaire Score   Pre Score 20/24             Core Components/Risk  Factors/Patient Goals at Admission:  Personal Goals and Risk Factors at Admission - 01/06/22 0843       Core Components/Risk Factors/Patient Goals on Admission   Diabetes Yes    Intervention Provide education about signs/symptoms and action to take for hypo/hyperglycemia.;Provide education about proper nutrition, including hydration, and aerobic/resistive exercise prescription along with prescribed medications to achieve blood glucose in normal ranges: Fasting glucose 65-99 mg/dL    Expected Outcomes Short Term: Participant verbalizes understanding of the signs/symptoms and immediate care of hyper/hypoglycemia, proper foot care and importance of medication, aerobic/resistive exercise and nutrition plan for blood glucose control.;Long Term: Attainment of HbA1C < 7%.    Hypertension Yes    Intervention Provide education on lifestyle modifcations including regular physical activity/exercise, weight management, moderate sodium restriction and increased consumption of fresh fruit, vegetables, and low fat dairy, alcohol moderation, and smoking cessation.;Monitor prescription use compliance.    Expected Outcomes Short Term: Continued assessment and intervention until BP is < 140/36m HG in hypertensive participants. < 130/866mHG in hypertensive participants with diabetes, heart failure or chronic kidney disease.;Long Term: Maintenance of blood pressure at goal levels.    Lipids Yes    Intervention Provide education and support for participant on nutrition & aerobic/resistive exercise along with prescribed medications to achieve LDL <7022mHDL >56m5m  Expected Outcomes Short Term: Participant states understanding of desired cholesterol values and is compliant with medications prescribed. Participant is following exercise prescription and nutrition guidelines.;Long Term: Cholesterol controlled with medications as prescribed, with individualized exercise RX and with personalized nutrition plan. Value goals:  LDL < 67m, HDL > 40 mg.    Personal Goal Other Yes    Personal Goal Improve strength and stamina    Intervention Attend CR three days per week and exercise at home 2-4 days per week.    Expected Outcomes He will report improvements in his strength and stamina. He will be able to improve his six minute walk test distance at discharge.             Core Components/Risk Factors/Patient Goals Review:   Goals and Risk Factor Review     Row Name 01/12/22 0714             Core Components/Risk Factors/Patient Goals Review   Personal Goals Review Diabetes;Lipids;Hypertension;Other;Weight Management/Obesity       Review Patient was referred to CR with NSTEMI/DES. He has mutiple risk factors for CAD and is participating in the program for risk modification. He plans to start today. His DM is managed with insulin, metformin, and farxiga. His personal goals for the program are to improve his strength and stamina. We will continue to monior his progress as he works towards meeting these goals.       Expected Outcomes Patient will complete the program meeting both personal and program goals.                Core Components/Risk Factors/Patient Goals at Discharge (Final Review):   Goals and Risk Factor Review - 01/12/22 0714       Core Components/Risk Factors/Patient Goals Review   Personal Goals Review Diabetes;Lipids;Hypertension;Other;Weight Management/Obesity    Review Patient was referred to CR with NSTEMI/DES. He has mutiple risk factors for CAD and is participating in the program for risk modification. He plans to start today. His DM is managed with insulin, metformin, and farxiga. His personal goals for the program are to improve his strength and stamina. We will continue to monior his progress as he works towards meeting these goals.    Expected Outcomes Patient will complete the program meeting both personal and program goals.             ITP Comments:   Comments: ITP  REVIEW Pt is making expected progress toward Cardiac Rehab goals after completing 2 sessions. Recommend continued exercise, life style modification, education, and increased stamina and strength.

## 2022-01-14 NOTE — Progress Notes (Signed)
Daily Session Note  Patient Details  Name: Randy Castro. MRN: [id number] Date of Birth: 12/29/1950 Referring Provider:   Flowsheet Row CARDIAC REHAB PHASE II ORIENTATION from 01/06/2022 in [place]  Referring Provider Dr. Louanne Belton       Encounter Date: 01/14/2022  Check In:  Session Check In - 01/14/22 0930       Check-In   Supervising physician immediately available to respond to emergencies [facility] MD immediately available    Physician(s) Dr Domenic Polite    Location [facility]    Staff Present Redge Gainer, BS, Exercise Physiologist;Baleigh Rennaker Hassell Done, RN, Bjorn Loser, MS, ACSM-CEP, Exercise Physiologist    Virtual Visit No    Medication changes reported     No    Fall or balance concerns reported    Yes    Comments He has fallen twice due to dizziness and vertigo. He also has diabetic neuropathy which alters his balance as well.    Tobacco Cessation No Change    Warm-up and Cool-down Performed as group-led instruction    Resistance Training Performed Yes    VAD Patient? No    PAD/SET Patient? No      Pain Assessment   Currently in Pain? No/denies    Pain Score 0-No pain    Multiple Pain Sites No             Capillary Blood Glucose: No results found for this or any previous visit (from the past 24 hour(s)).    Social History   Tobacco Use  Smoking Status Former   Packs/day: 1.00   Years: 25.00   Pack years: 25.00   Types: Cigarettes   Quit date: 04/12/1992   Years since quitting: 29.7  Smokeless Tobacco Never    Goals Met:  Independence with exercise equipment Exercise tolerated well No report of concerns or symptoms today Strength training completed today  Goals Unmet:  Not Applicable  Comments: Checkout at 1030.   Dr. Carlyle Dolly is Medical Director for [facility] Cardiac Rehab

## 2022-01-16 ENCOUNTER — Encounter (HOSPITAL_COMMUNITY)
Admission: RE | Admit: 2022-01-16 | Discharge: 2022-01-16 | Disposition: A | Discharge status: Home / Self Care | Payer: PPO | Source: Ambulatory Visit | Attending: Cardiology | Admitting: Cardiology

## 2022-01-16 DIAGNOSIS — I214 Non-ST elevation (NSTEMI) myocardial infarction: Secondary | ICD-10-CM

## 2022-01-16 DIAGNOSIS — Z955 Presence of coronary angioplasty implant and graft: Secondary | ICD-10-CM

## 2022-01-16 NOTE — Progress Notes (Signed)
Daily Session Note  Patient Details  Name: Randy Castro. MRN: [id number] Date of Birth: 07/18/1951 Referring Provider:   Flowsheet Row CARDIAC REHAB PHASE II ORIENTATION from 01/06/2022 in [place]  Referring Provider Dr. Louanne Belton       Encounter Date: 01/16/2022  Check In:  Session Check In - 01/16/22 0922       Check-In   Supervising physician immediately available to respond to emergencies [facility] MD immediately available    Physician(s) Dr Debara Pickett    Location [facility]    Staff Present Redge Gainer, BS, Exercise Physiologist;Christy Oletta Lamas, RN, Joanette Gula, RN, BSN    Virtual Visit No    Medication changes reported     No    Fall or balance concerns reported    No    Comments He has fallen twice due to dizziness and vertigo. He also has diabetic neuropathy which alters his balance as well.    Tobacco Cessation No Change    Warm-up and Cool-down Performed as group-led instruction    Resistance Training Performed Yes    VAD Patient? No    PAD/SET Patient? No      Pain Assessment   Currently in Pain? No/denies    Pain Score 0-No pain    Multiple Pain Sites No             Capillary Blood Glucose: No results found for this or any previous visit (from the past 24 hour(s)).    Social History   Tobacco Use  Smoking Status Former   Packs/day: 1.00   Years: 25.00   Pack years: 25.00   Types: Cigarettes   Quit date: 04/12/1992   Years since quitting: 29.7  Smokeless Tobacco Never    Goals Met:  Independence with exercise equipment Exercise tolerated well No report of concerns or symptoms today Strength training completed today  Goals Unmet:  Not Applicable  Comments: Checkout at 1030.   Dr. Carlyle Dolly is Medical Director for [facility] Cardiac Rehab

## 2022-01-16 NOTE — Progress Notes (Signed)
I have reviewed a Home Exercise Prescription with Randy Castro. Randy Castro is  currently exercising at home.  The patient was advised to walk 5 days a week for 30-45 minutes.  [name] and I discussed how to progress their exercise prescription.  The patient stated that their goals were build back up his strength and endurance.  The patient stated that they understand the exercise prescription.  We reviewed exercise guidelines, target heart rate during exercise, RPE Scale, weather conditions, NTG use, endpoints for exercise, warmup and cool down.  Patient is encouraged to come to me with any questions. I will continue to follow up with the patient to assist them with progression and safety.

## 2022-01-19 ENCOUNTER — Encounter (HOSPITAL_COMMUNITY): Payer: PPO

## 2022-01-20 ENCOUNTER — Ambulatory Visit (INDEPENDENT_AMBULATORY_CARE_PROVIDER_SITE_OTHER): Payer: PPO | Admitting: Pharmacist

## 2022-01-20 DIAGNOSIS — I10 Essential (primary) hypertension: Secondary | ICD-10-CM

## 2022-01-20 DIAGNOSIS — R42 Dizziness and giddiness: Secondary | ICD-10-CM

## 2022-01-20 DIAGNOSIS — I5022 Chronic systolic (congestive) heart failure: Secondary | ICD-10-CM | POA: Diagnosis not present

## 2022-01-20 DIAGNOSIS — E1165 Type 2 diabetes mellitus with hyperglycemia: Secondary | ICD-10-CM

## 2022-01-20 DIAGNOSIS — E7800 Pure hypercholesterolemia, unspecified: Secondary | ICD-10-CM

## 2022-01-20 DIAGNOSIS — I2510 Atherosclerotic heart disease of native coronary artery without angina pectoris: Secondary | ICD-10-CM

## 2022-01-20 NOTE — Patient Instructions (Signed)
Randy Castro, It was a pleasure speaking with you  Below is a summary of your health goals and care plan  Patient Goals/Self-Care Activities take medications as prescribed Check weight daily. Contact office if weight increase by more than 3lbs in 24 hours of 5 lbs in 1 week; or if you experience  signs and symptoms of heart failure - weight gain, shortness of breath, abdominal fullness, swelling in legs or abdomen, Fatigue and weakness, changes in ability to perform usual activities, persistent cough or wheezing with white or pink blood-tinged mucus, nausea and lack of appetite check glucose with Continuous Glucose Monitor.Contact office if any blood glucose reading < 80 or > 200. check blood pressure 1 to 2 times per week, document, and provide at future appointments engage in dietary modifications by decreasing intake of high sugar / CHO snacks and limiting serving sizes Complete application for Entresto and return to either [name] at Dr Ethel Rana office or cardiology office.    If you have any questions or concerns, please feel free to contact me either at the phone number below or with a MyChart message.   Keep up the good work!  [name], PharmD Clinical Pharmacist [facility] [place] [phone] (direct line)  [phone] (main office number)   The patient verbalized understanding of instructions, educational materials, and care plan provided today and agreed to receive a mailed copy of patient instructions, educational materials, and care plan.

## 2022-01-20 NOTE — Chronic Care Management (AMB) (Signed)
Chronic Care Management Pharmacy Note  01/20/2022 Name:  Randy Castro. MRN:  [id number] DOB:  01-31-1951  Summary:  Unfortunately since our last visit patient was hospitalization for NSTEMI and had revascularization with stent. Prior to discharge clopidogrel 27m daily and Entresto 24/28mtwice daily was started. Additionally patient was started spironolactone 2566m take 0.5 tablet = 12.5mg50mily and increased Farxiga to 10mg9mly to optimize CHF guideline directed therapy. Patient is tolerating new medication well. He is attending cardio rehab - has had 3 visits so far. His wife reports that he has started using Continuous Glucose Monitor and is helping a lot to identify lows that patient did not know he was having.  Wife does report that he will enter Medicare coverage gap soon. She is concerned about cost of [place] for patient assistance program - household income is too high for Farxi[place] will likely qualify for [facility] assistance program. Mailed application to patient.  Also checked for disease state funds but CHF funding is currently closed. Will continue to monitor.  Reviewed how to interpret blood glucose arrows on Continuous Glucose Monitor to help guide treatment / meals.   Subjective: Randy Castro 71 y.71 year old male who is a primary patient of Paz, Randy Castro  The CCM team was consulted for assistance with disease management and care coordination needs.    Engaged with patient by telephone for follow up visit in response to provider referral for pharmacy case management and/or care coordination services.   Consent to Services:  The patient was given information about Chronic Care Management services, agreed to services, and gave verbal consent prior to initiation of services.  Please see initial visit note for detailed documentation.   Patient Care Team: Paz, Colon Branchas PCP - General (Internal Medicine) McDowSatira Sarkas PCP - Cardiology (Cardiology) McLeaLarey Dresseras Consulting Physician (Cardiology) Le, TLake Bells as Referring Physician (Gastroenterology) MaierGearlean AlfC as Physician Assistant (Pulmonary Disease) TeppaDemetrius Revelas Consulting Physician (Bariatrics) [name], RebecEustace Quailas Consulting Physician (Neurology) EckarCherre Robins-CFour Cornersrmacist) CotteMadelin Headings(Optometry)  Recent office visits: 11/27/2021 - Int Med (Dr Paz) Larose Kellsual physical exam. Labs checked. No med changes noted.  10/21/2021 - Int Med (Dr Paz) Larose Kellsd for acute visit - dizziness. Ordered MRI. MRI showed No acute intracranial process. No etiology is seen for the patient's dizziness. Referred to physical therapy for benign paroxysmal vertigo. 07/23/2021 - Int Med (Dr Paz) Larose Kells anxiety and depression. Improved with fluoxetine 40mg 66my. BP was at goal. F/U 3 to 4 months  Recent consult visits: 12/22/2021 - Cardio (WalkeGilford RileF/U hospitalization. Increased farxiga to 10mg d36m. Labs checked. After normal CMP- spironolactone 12.5mg dai24mstarted.  11/20/2021 - Cardio (Dr McDowellDomenic PoliteD. No med changes noted.  11/18/2021 - Endo (Dr ShamleffKelton Pillarabetes. A1c down form 9.2% to 7.8%. Changed from 70/30 mix insulin to Lantus 20 units daily. Continued metformin and Farxiga at current dosese. Plan to get Libre 2.[place] months. 07/15/2021 - Endo (Dr ShamleffKelton Pillarabetes. A1c was 9.1. Started Farxiga 5mg dail75mefore breakfast. BP also noted to be elevated.   [facility] 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  New medications stated at discharge: clopidogrel 75mg dail24mr 1 year. Entresto 24/26mg twice66may  Medications stopped at discharge: losartan 14m  Objective:  Lab Results  Component Value Date   CREATININE 0.67 (L) 12/22/2021   CREATININE 0.72 12/09/2021   CREATININE 0.81  12/08/2021    Lab Results  Component Value Date   HGBA1C 7.8 (A) 11/18/2021   Last diabetic Eye exam:  Lab Results  Component Value Date/Time   HMDIABEYEEXA No Retinopathy 06/18/2021 12:00 AM    Last diabetic Foot exam:  Lab Results  Component Value Date/Time   HMDIABFOOTEX Done 03/19/2019 12:00 AM        Component Value Date/Time   CHOL 110 11/27/2021 0930   TRIG 121.0 11/27/2021 0930   HDL 45.40 11/27/2021 0930   CHOLHDL 2 11/27/2021 0930   VLDL 24.2 11/27/2021 0930   LDLCALC 40 11/27/2021 0930   LDLDIRECT 145.0 04/26/2018 1026       Latest Ref Rng & Units 12/07/2021   12:40 AM 12/06/2021    5:26 AM 07/23/2021    9:20 AM  Hepatic Function  Total Protein 6.5 - 8.1 g/dL 5.3   6.3   5.9    Albumin 3.5 - 5.0 g/dL 3.0   3.6   3.9    AST 15 - 41 U/L 30   43   26    ALT 0 - 44 U/L 32   42   37    Alk Phosphatase 38 - 126 U/L 53   61   60    Total Bilirubin 0.3 - 1.2 mg/dL 0.6   0.4   0.5    Bilirubin, Direct 0.0 - 0.3 mg/dL   0.1      Lab Results  Component Value Date/Time   TSH 1.11 04/17/2021 09:19 AM   TSH 0.92 04/26/2018 10:26 AM       Latest Ref Rng & Units 12/22/2021   11:48 AM 12/09/2021    4:13 AM 12/08/2021    4:12 AM  CBC  WBC 3.4 - 10.8 x10E3/uL 7.9   6.1   8.0    Hemoglobin 13.0 - 17.7 g/dL 13.6   11.5   11.4    Hematocrit 37.5 - 51.0 % 41.7   34.3   34.6    Platelets 150 - 450 x10E3/uL 293   207   236      No results found for: VD25OH  Clinical ASCVD: Yes  The ASCVD Risk score (Arnett DK, et al., 2019) failed to calculate for the following reasons:   The patient has a prior MI or stroke diagnosis     Social History   Tobacco Use  Smoking Status Former   Packs/day: 1.00   Years: 25.00   Pack years: 25.00   Types: Cigarettes   Quit date: 04/12/1992   Years since quitting: 29.7  Smokeless Tobacco Never   BP Readings from Last 3 Encounters:  01/06/22 120/76  12/22/21 136/80  12/09/21 (!) 158/71   Pulse Readings from Last 3 Encounters:   01/06/22 62  12/22/21 (!) 56  12/09/21 61   Wt Readings from Last 3 Encounters:  01/12/22 212 lb 4.9 oz (96.3 kg)  12/22/21 213 lb 6.4 oz (96.8 kg)  12/07/21 216 lb 4.3 oz (98.1 kg)    Assessment: Review of patient past medical history, allergies, medications, health status, including review of consultants reports, laboratory and other test data, was performed as part of comprehensive evaluation and provision of chronic care management services.   SDOH:  (Social Determinants of Health) assessments and interventions performed:     CCM  Care Plan  Allergies  Allergen Reactions   Trulicity [Dulaglutide] Nausea And Vomiting    Medications Reviewed Today     Reviewed by Merceda Elks, CPhT (Pharmacy Technician) on 01/05/22 at [place]  Med List Status: Complete   Medication Order Taking? Sig Documenting Provider Last Dose Status Informant  aspirin EC 81 MG tablet [id number] Yes Take 81 mg by mouth daily. [provider]  Active Spouse/Significant Other  atorvastatin (LIPITOR) 80 MG tablet [id number] Yes TAKE ONE TABLET BY MOUTH EVERYDAY AT BEDTIME  Patient taking differently: Take 80 mg by mouth daily.   Colon Branch, MD  Active Spouse/Significant Other  Blood Glucose Monitoring Suppl (ONE TOUCH ULTRA 2) w/Device KIT [id number]  by Does not apply route. [provider]  Active Spouse/Significant Other  carvedilol (COREG) 12.5 MG tablet [id number] Yes TAKE ONE TABLET BY MOUTH EVERY MORNING and TAKE ONE TABLET BY MOUTH EVERYDAY AT BEDTIME  Patient taking differently: Take 12.5 mg by mouth 2 (two) times daily with a meal.   Colon Branch, MD  Active Spouse/Significant Other  clopidogrel (PLAVIX) 75 MG tablet [id number] Yes Take 1 tablet (75 mg total) by mouth daily. Loel Dubonnet, NP  Active Spouse/Significant Other  Continuous Blood Gluc Sensor (FREESTYLE LIBRE 2 SENSOR) MISC [id number]  1 Device by Does not apply route every 14 (fourteen) days. Shamleffer, Melanie Crazier, MD  Active Spouse/Significant Other  dapagliflozin propanediol (FARXIGA) 10 MG TABS tablet [id number] Yes Take 1 tablet (10 mg total) by mouth daily. Loel Dubonnet, NP  Active Spouse/Significant Other  FEROSUL 325 (65 Fe) MG tablet [id number] Yes TAKE 1 TABLET BY MOUTH DAILY  Patient taking differently: Take 325 mg by mouth daily.   Cirigliano, Vito V, DO  Active Spouse/Significant Other  FLUoxetine (PROZAC) 40 MG capsule [id number] Yes Take 1 capsule (40 mg total) by mouth daily. Colon Branch, MD  Active Spouse/Significant Other  glucose blood (ONETOUCH ULTRA) test strip [id number]  Check blood sugar 2-3 times daily.  Dx Code: E11.9 Colon Branch, MD  Active Spouse/Significant Other  insulin glargine (LANTUS SOLOSTAR) 100 UNIT/ML Solostar Pen [id number] Yes Inject 20 Units into the skin daily.  Patient taking differently: Inject 18 Units into the skin daily.   Shamleffer, Melanie Crazier, MD  Active Spouse/Significant Other  magnesium oxide (MAG-OX) 400 MG tablet [id number] Yes TAKE ONE TABLET BY MOUTH EVERY MORNING and TAKE ONE TABLET BY MOUTH EVERYDAY AT BEDTIME  Patient taking differently: Take 400 mg by mouth 2 (two) times daily.   Colon Branch, MD  Active Spouse/Significant Other  metFORMIN (GLUCOPHAGE) 1000 MG tablet [id number] Yes Take 1 tablet (1,000 mg total) by mouth 2 (two) times daily with a meal. Shamleffer, Melanie Crazier, MD  Active Spouse/Significant Other  Multiple Vitamins-Minerals (MULTIVITAMIN WITH MINERALS) tablet [id number] Yes Take 1 tablet by mouth daily. [provider]  Active Spouse/Significant Other  nitroGLYCERIN (NITROSTAT) 0.4 MG SL tablet [id number] Yes Place 1 tablet (0.4 mg total) under the tongue every 5 (five) minutes as needed for chest pain. Loel Dubonnet, NP  Active Spouse/Significant Other  pantoprazole (PROTONIX) 40 MG tablet [id number] Yes TAKE ONE TABLET BY MOUTH EVERY MORNING and TAKE ONE TABLET BY MOUTH EVERYDAY AT BEDTIME  Patient taking  differently: Take 40 mg by mouth 2 (two) times daily.   Colon Branch, MD  Active Spouse/Significant Other  Potassium 99 MG TABS [id number] Yes Take 99 mg by mouth daily. [provider]  Active Spouse/Significant Other  primidone (MYSOLINE) 50 MG tablet [id number] Yes TAKE THREE TABLETS BY MOUTH EVERY MORNING and TAKE TWO TABLETS BY MOUTH EVERYDAY AT BEDTIME  Patient taking differently: Take 100-150 mg by mouth See admin instructions. TAKE THREE TABLETS BY MOUTH EVERY MORNING and TAKE TWO TABLETS BY MOUTH EVERYDAY AT BEDTIME   Narda Amber K, DO  Active Spouse/Significant Other  sacubitril-valsartan (ENTRESTO) 24-26 MG [id number] Yes Take 1 tablet by mouth 2 (two) times daily. Loel Dubonnet, NP  Active Spouse/Significant Other  spironolactone (ALDACTONE) 25 MG tablet [id number] Yes Take 0.5 tablets (12.5 mg total) by mouth daily. Loel Dubonnet, NP  Active Spouse/Significant Other  vitamin B-12 (CYANOCOBALAMIN) 1000 MCG tablet [id number] Yes Take 1,000 mcg by mouth daily. 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   Abdominal pain 11/17/2018   Cardiac arrhythmia, unspecified 11/14/2017   Hypomagnesemia 11/14/2017   Anemia 11/14/2017   Bleeding skin mole 11/14/2017   OSA on CPAP 09/21/2017   Obesity 01/23/2016   PCP NOTES >>> 06/08/2015   Anxiety and depression 05/31/2013   Intermediate coronary syndrome ([place]) 12/22/2012   Unstable  angina ([place]) 12/16/2012   Fatigue 04/26/2012   Annual physical exam 10/28/2011   Rash 10/28/2011   Coronary Artery Disease 08/28/2011   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   Palpitations 08/28/2011   NSTEMI (non-ST elevated myocardial infarction) ([place]) 08/07/2011   DJD (degenerative joint disease) 05/28/2011   Poorly controlled type 2 diabetes mellitus with circulatory disorder ([place])    Hyperlipidemia    HTN (hypertension)     Immunization History  Administered Date(s) Administered   Fluad Quad(high Dose 65+) 05/04/2019   Influenza Split 06/30/2011   Influenza, High Dose Seasonal PF 05/31/2013, 05/27/2017, 06/07/2018   Influenza, Seasonal, Injecte, Preservative Fre 07/28/2012   Influenza,inj,Quad PF,6+ Mos 05/29/2014, 06/07/2015, 04/23/2016   Influenza-Unspecified 05/24/2020, 06/24/2021   Moderna Covid-19 Vaccine Bivalent Booster 49yr & up 08/29/2021   PFIZER(Purple Top)SARS-COV-2 Vaccination 10/11/2019, 11/01/2019, 05/20/2020   Pneumococcal Conjugate-13 12/05/2014   Pneumococcal Polysaccharide-23 08/07/2011, 10/12/2018   Tdap 05/31/2013   Zoster Recombinat (Shingrix) 04/17/2021, 06/24/2021   Zoster, Live 11/28/2012    Conditions to be addressed/monitored: CAD, HTN, HLD, DMII, Anxiety, Depression and B12 deficiency; iron def anemia; ;essential tremor; peripheral neuropathy; hypomagnesemia; h/o gastric bypass surgery; h/o lower GI bleed and GERD  There are no care plans that you recently modified to display for this patient.    Medication Assistance:  Screened for FIranpatient assistance program - household income did not qualify (patient's wife is still working and income too high)   Patient's preferred pharmacy is:  [facility] [place] [address] [place]: [phone]: [address]  [address]Phone: [phone]: [phone]  Uses  pill box? No - using packaging from [employer] Adherence is about 98% - working with patient and his wife to increase adherence.    Follow Up:  Patient agrees to Care Plan and Follow-up.  Plan: Telephone follow up appointment with care management team member scheduled for:  1 to 2 months  [name], PharmD Clinical Pharmacist [place] [phone]

## 2022-01-21 ENCOUNTER — Encounter (HOSPITAL_COMMUNITY)
Admission: RE | Admit: 2022-01-21 | Discharge: 2022-01-21 | Disposition: A | Discharge status: Home / Self Care | Payer: PPO | Source: Ambulatory Visit | Attending: Cardiology | Admitting: Cardiology

## 2022-01-21 DIAGNOSIS — E1140 Type 2 diabetes mellitus with diabetic neuropathy, unspecified: Secondary | ICD-10-CM

## 2022-01-21 DIAGNOSIS — E785 Hyperlipidemia, unspecified: Secondary | ICD-10-CM | POA: Diagnosis not present

## 2022-01-21 DIAGNOSIS — F32A Depression, unspecified: Secondary | ICD-10-CM | POA: Diagnosis not present

## 2022-01-21 DIAGNOSIS — I10 Essential (primary) hypertension: Secondary | ICD-10-CM | POA: Diagnosis not present

## 2022-01-21 DIAGNOSIS — Z955 Presence of coronary angioplasty implant and graft: Secondary | ICD-10-CM

## 2022-01-21 DIAGNOSIS — Z794 Long term (current) use of insulin: Secondary | ICD-10-CM

## 2022-01-21 DIAGNOSIS — Z87891 Personal history of nicotine dependence: Secondary | ICD-10-CM | POA: Diagnosis not present

## 2022-01-21 DIAGNOSIS — D509 Iron deficiency anemia, unspecified: Secondary | ICD-10-CM

## 2022-01-21 DIAGNOSIS — I2510 Atherosclerotic heart disease of native coronary artery without angina pectoris: Secondary | ICD-10-CM

## 2022-01-21 DIAGNOSIS — I214 Non-ST elevation (NSTEMI) myocardial infarction: Secondary | ICD-10-CM | POA: Diagnosis not present

## 2022-01-21 LAB — BASIC METABOLIC PANEL
BUN/Creatinine Ratio: 11 (ref 10–24)
BUN: 9 mg/dL (ref 8–27)
CO2: 22 mmol/L (ref 20–29)
Calcium: 8.9 mg/dL (ref 8.6–10.2)
Chloride: 103 mmol/L (ref 96–106)
Creatinine, Ser: 0.84 mg/dL (ref 0.76–1.27)
Glucose: 403 mg/dL — ABNORMAL HIGH (ref 70–99)
Potassium: 4.4 mmol/L (ref 3.5–5.2)
Sodium: 141 mmol/L (ref 134–144)
eGFR: 94 mL/min/{1.73_m2} (ref >59)

## 2022-01-21 NOTE — Progress Notes (Signed)
Daily Session Note  Patient Details  Name: Randy Castro. MRN: [id number] Date of Birth: 1951-02-10 Referring Provider:   Flowsheet Row CARDIAC REHAB PHASE II ORIENTATION from 01/06/2022 in [place]  Referring Provider Dr. Louanne Belton       Encounter Date: 01/21/2022  Check In:  Session Check In - 01/21/22 0929       Check-In   Supervising physician immediately available to respond to emergencies [facility] MD immediately available    Physician(s) Dr Harl Bowie    Location [facility]    Staff Present Rashod Gougeon Hassell Done, RN, Bjorn Loser, MS, ACSM-CEP, Exercise Physiologist    Virtual Visit No    Medication changes reported     No    Fall or balance concerns reported    No    Comments He has fallen twice due to dizziness and vertigo. He also has diabetic neuropathy which alters his balance as well.    Tobacco Cessation No Change    Warm-up and Cool-down Performed as group-led instruction    Resistance Training Performed Yes    VAD Patient? No    PAD/SET Patient? No      Pain Assessment   Currently in Pain? No/denies    Pain Score 0-No pain    Multiple Pain Sites No             Capillary Blood Glucose: Results for orders placed or performed in visit on 12/23/21 (from the past 24 hour(s))  Basic metabolic panel     Status: Abnormal   Collection Time: 01/20/22 10:56 AM  Result Value Ref Range   Glucose 403 (H) 70 - 99 mg/dL   BUN 9 8 - 27 mg/dL   Creatinine, Ser 0.84 0.76 - 1.27 mg/dL   eGFR 94 >59 mL/min/1.73   BUN/Creatinine Ratio 11 10 - 24   Sodium 141 134 - 144 mmol/L   Potassium 4.4 3.5 - 5.2 mmol/L   Chloride 103 96 - 106 mmol/L   CO2 22 20 - 29 mmol/L   Calcium 8.9 8.6 - 10.2 mg/dL   Narrative   Performed at:  [facility] [address], [place], [place]  [id number] Lab Director: Rush Farmer MD, Phone:  [phone]      Social History   Tobacco Use  Smoking Status Former   Packs/day: 1.00   Years:  25.00   Pack years: 25.00   Types: Cigarettes   Quit date: 04/12/1992   Years since quitting: 29.7  Smokeless Tobacco Never    Goals Met:  Independence with exercise equipment Exercise tolerated well No report of concerns or symptoms today Strength training completed today  Goals Unmet:  Not Applicable  Comments: check out at 1030.   Dr. Carlyle Dolly is Medical Director for [facility] Cardiac Re[facility]

## 2022-01-22 ENCOUNTER — Telehealth (HOSPITAL_BASED_OUTPATIENT_CLINIC_OR_DEPARTMENT_OTHER): Payer: Self-pay

## 2022-01-22 NOTE — Telephone Encounter (Addendum)
Results called to patient who verbalizes understanding!      ----- Message from Loel Dubonnet, NP sent at 01/21/2022  7:38 AM EDT ----- Normal kidney function, electrolytes. Continue current medications. Glucose very elevated. Please ensure monitoring glucose carefully and following with endocrinology.

## 2022-01-23 ENCOUNTER — Encounter (HOSPITAL_COMMUNITY)
Admission: RE | Admit: 2022-01-23 | Discharge: 2022-01-23 | Disposition: A | Discharge status: Home / Self Care | Payer: PPO | Source: Ambulatory Visit | Attending: Cardiology | Admitting: Cardiology

## 2022-01-23 DIAGNOSIS — I214 Non-ST elevation (NSTEMI) myocardial infarction: Secondary | ICD-10-CM | POA: Diagnosis not present

## 2022-01-23 DIAGNOSIS — Z955 Presence of coronary angioplasty implant and graft: Secondary | ICD-10-CM | POA: Insufficient documentation

## 2022-01-23 NOTE — Progress Notes (Signed)
Daily Session Note  Patient Details  Name: Randy Castro. MRN: [id number] Date of Birth: [birth year] Referring Provider:   Flowsheet Row CARDIAC REHAB PHASE II ORIENTATION from 01/06/2022 in [place]  Referring Provider Dr. Louanne Belton       Encounter Date: 01/23/2022  Check In:  Session Check In - 01/23/22 0927       Check-In   Supervising physician immediately available to respond to emergencies [facility] MD immediately available    Physician(s) Dr Gardiner Rhyme    Location [facility]    Staff Present Redge Gainer, BS, Exercise Physiologist;Oluwasemilore Bahl Hassell Done, RN, Bjorn Loser, MS, ACSM-CEP, Exercise Physiologist    Virtual Visit No    Medication changes reported     No    Fall or balance concerns reported    No    Tobacco Cessation No Change    Warm-up and Cool-down Performed as group-led instruction    Resistance Training Performed Yes    VAD Patient? No    PAD/SET Patient? No      Pain Assessment   Currently in Pain? No/denies    Pain Score 0-No pain    Multiple Pain Sites No             Capillary Blood Glucose: No results found for this or any previous visit (from the past 24 hour(s)).    Social History   Tobacco Use  Smoking Status Former   Packs/day: 1.00   Years: 25.00   Pack years: 25.00   Types: Cigarettes   Quit date: 04/12/1992   Years since quitting: 29.8  Smokeless Tobacco Never    Goals Met:  Independence with exercise equipment Exercise tolerated well No report of concerns or symptoms today Strength training completed today  Goals Unmet:  Not Applicable  Comments: Checkout at 1030.   Dr. Carlyle Dolly is Medical Director for [facility] Cardiac Rehab

## 2022-01-26 ENCOUNTER — Encounter (HOSPITAL_COMMUNITY)
Admission: RE | Admit: 2022-01-26 | Discharge: 2022-01-26 | Disposition: A | Discharge status: Home / Self Care | Payer: PPO | Source: Ambulatory Visit | Attending: Cardiology | Admitting: Cardiology

## 2022-01-26 VITALS — Wt 209.0 lb

## 2022-01-26 DIAGNOSIS — I214 Non-ST elevation (NSTEMI) myocardial infarction: Secondary | ICD-10-CM | POA: Diagnosis not present

## 2022-01-26 DIAGNOSIS — Z955 Presence of coronary angioplasty implant and graft: Secondary | ICD-10-CM

## 2022-01-26 NOTE — Progress Notes (Signed)
Daily Session Note  Patient Details  Name: Randy Castro. MRN: [id number] Date of Birth: 07/19/1951 Referring Provider:   Flowsheet Row CARDIAC REHAB PHASE II ORIENTATION from 01/06/2022 in [place]  Referring Provider Dr. Louanne Belton       Encounter Date: 01/26/2022  Check In:  Session Check In - 01/26/22 0930       Check-In   Supervising physician immediately available to respond to emergencies [facility] MD immediately available    Physician(s) Dr. Marlou Porch    Location [facility]    Staff Present Redge Gainer, BS, Exercise Physiologist;Debra Wynetta Emery, RN, Bjorn Loser, MS, ACSM-CEP, Exercise Physiologist    Virtual Visit No    Medication changes reported     No    Fall or balance concerns reported    No    Tobacco Cessation No Change    Warm-up and Cool-down Performed as group-led instruction    Resistance Training Performed Yes    VAD Patient? No    PAD/SET Patient? No      Pain Assessment   Currently in Pain? No/denies    Pain Score 0-No pain    Multiple Pain Sites No             Capillary Blood Glucose: No results found for this or any previous visit (from the past 24 hour(s)).    Social History   Tobacco Use  Smoking Status Former   Packs/day: 1.00   Years: 25.00   Pack years: 25.00   Types: Cigarettes   Quit date: 04/12/1992   Years since quitting: 29.8  Smokeless Tobacco Never    Goals Met:  Independence with exercise equipment Exercise tolerated well No report of concerns or symptoms today Strength training completed today  Goals Unmet:  Not Applicable  Comments: checkout time is 1030   Dr. Carlyle Dolly is Medical Director for [facility]

## 2022-01-28 ENCOUNTER — Encounter (HOSPITAL_COMMUNITY)
Admission: RE | Admit: 2022-01-28 | Discharge: 2022-01-28 | Disposition: A | Discharge status: Home / Self Care | Payer: PPO | Source: Ambulatory Visit | Attending: Cardiology | Admitting: Cardiology

## 2022-01-28 DIAGNOSIS — Z955 Presence of coronary angioplasty implant and graft: Secondary | ICD-10-CM

## 2022-01-28 DIAGNOSIS — I214 Non-ST elevation (NSTEMI) myocardial infarction: Secondary | ICD-10-CM

## 2022-01-28 NOTE — Progress Notes (Signed)
Daily Session Note  Patient Details  Name: Randy Castro. MRN: [id number] Date of Birth: 1951-04-20 Referring Provider:   Flowsheet Row CARDIAC REHAB PHASE II ORIENTATION from 01/06/2022 in [place]  Referring Provider Dr. Louanne Belton       Encounter Date: 01/28/2022  Check In:  Session Check In - 01/28/22 0930       Check-In   Supervising physician immediately available to respond to emergencies [facility] MD immediately available    Physician(s) Dr Johnsie Cancel    Location [facility]    Staff Present Redge Gainer, BS, Exercise Physiologist;Dalton Kris Mouton, MS, ACSM-CEP, Exercise Physiologist;Morrison Masser Hassell Done, RN, BSN    Virtual Visit No    Medication changes reported     No    Fall or balance concerns reported    No    Comments He has fallen twice due to dizziness and vertigo. He also has diabetic neuropathy which alters his balance as well.    Tobacco Cessation No Change    Warm-up and Cool-down Performed as group-led instruction    Resistance Training Performed Yes    VAD Patient? No    PAD/SET Patient? No      Pain Assessment   Currently in Pain? No/denies    Pain Score 0-No pain    Multiple Pain Sites No             Capillary Blood Glucose: No results found for this or any previous visit (from the past 24 hour(s)).    Social History   Tobacco Use  Smoking Status Former   Packs/day: 1.00   Years: 25.00   Pack years: 25.00   Types: Cigarettes   Quit date: 04/12/1992   Years since quitting: 29.8  Smokeless Tobacco Never    Goals Met:  Independence with exercise equipment Exercise tolerated well No report of concerns or symptoms today Strength training completed today  Goals Unmet:  Not Applicable  Comments: checkout at 1030.   Dr. Carlyle Dolly is Medical Director for [facility] Card[facility] Rehab

## 2022-01-30 ENCOUNTER — Encounter (HOSPITAL_COMMUNITY)
Admission: RE | Admit: 2022-01-30 | Discharge: 2022-01-30 | Disposition: A | Discharge status: Home / Self Care | Payer: PPO | Source: Ambulatory Visit | Attending: Cardiology | Admitting: Cardiology

## 2022-01-30 DIAGNOSIS — I214 Non-ST elevation (NSTEMI) myocardial infarction: Secondary | ICD-10-CM | POA: Diagnosis not present

## 2022-01-30 DIAGNOSIS — Z955 Presence of coronary angioplasty implant and graft: Secondary | ICD-10-CM

## 2022-01-30 NOTE — Progress Notes (Signed)
Daily Session Note  Patient Details  Name: Randy Castro. MRN: [id number] Date of Birth: September 07, 1950 Referring Provider:   Flowsheet Row CARDIAC REHAB PHASE II ORIENTATION from 01/06/2022 in [place]  Referring Provider Dr. Louanne Belton       Encounter Date: 01/30/2022  Check In:  Session Check In - 01/30/22 0930       Check-In   Supervising physician immediately available to respond to emergencies [facility] MD immediately available    Physician(s) Dr. Domenic Polite    Location [facility]    Staff Present Redge Gainer, BS, Exercise Physiologist;Emerie Vanderkolk Wynetta Emery, RN, Bjorn Loser, MS, ACSM-CEP, Exercise Physiologist    Virtual Visit No    Medication changes reported     No    Fall or balance concerns reported    Yes    Comments He has fallen twice due to dizziness and vertigo. He also has diabetic neuropathy which alters his balance as well.    Tobacco Cessation No Change    Warm-up and Cool-down Performed as group-led instruction    Resistance Training Performed Yes    VAD Patient? No    PAD/SET Patient? No      Pain Assessment   Currently in Pain? No/denies    Pain Score 0-No pain    Multiple Pain Sites No             Capillary Blood Glucose: No results found for this or any previous visit (from the past 24 hour(s)).    Social History   Tobacco Use  Smoking Status Former   Packs/day: 1.00   Years: 25.00   Total pack years: 25.00   Types: Cigarettes   Quit date: 04/12/1992   Years since quitting: 29.8  Smokeless Tobacco Never    Goals Met:  Independence with exercise equipment Exercise tolerated well No report of concerns or symptoms today Strength training completed today  Goals Unmet:  Not Applicable  Comments: Check out 1030.   Dr. Carlyle Dolly is Medical Director for [facility] Card[facility] Re[facility]

## 2022-02-02 ENCOUNTER — Encounter (HOSPITAL_COMMUNITY)
Admission: RE | Admit: 2022-02-02 | Discharge: 2022-02-02 | Disposition: A | Discharge status: Home / Self Care | Payer: PPO | Source: Ambulatory Visit | Attending: Cardiology | Admitting: Cardiology

## 2022-02-02 DIAGNOSIS — I214 Non-ST elevation (NSTEMI) myocardial infarction: Secondary | ICD-10-CM

## 2022-02-02 DIAGNOSIS — Z955 Presence of coronary angioplasty implant and graft: Secondary | ICD-10-CM

## 2022-02-02 NOTE — Progress Notes (Signed)
Daily Session Note  Patient Details  Name: Randy Castro. MRN: [id number] Date of Birth: August 02, 1951 Referring Provider:   Flowsheet Row CARDIAC REHAB PHASE II ORIENTATION from 01/06/2022 in [place]  Referring Provider Dr. Louanne Belton       Encounter Date: 02/02/2022  Check In:  Session Check In - 02/02/22 0930       Check-In   Supervising physician immediately available to respond to emergencies [facility] MD immediately available    Physician(s) Dr. Debara Pickett    Location [facility]    Staff Present Hoy Register, MS, ACSM-CEP, Exercise Physiologist;Heather Zigmund Daniel, Exercise Physiologist    Virtual Visit No    Medication changes reported     No    Fall or balance concerns reported    Yes    Comments He has fallen twice due to dizziness and vertigo. He also has diabetic neuropathy which alters his balance as well.    Tobacco Cessation No Change    Warm-up and Cool-down Performed as group-led instruction    Resistance Training Performed Yes    VAD Patient? No    PAD/SET Patient? No      Pain Assessment   Currently in Pain? No/denies    Pain Score 0-No pain    Multiple Pain Sites No             Capillary Blood Glucose: No results found for this or any previous visit (from the past 24 hour(s)).    Social History   Tobacco Use  Smoking Status Former   Packs/day: 1.00   Years: 25.00   Total pack years: 25.00   Types: Cigarettes   Quit date: 04/12/1992   Years since quitting: 29.8  Smokeless Tobacco Never    Goals Met:  Independence with exercise equipment Exercise tolerated well No report of concerns or symptoms today Strength training completed today  Goals Unmet:  Not Applicable  Comments: checkout time is 1030   Dr. Carlyle Dolly is Medical Director for [facility]

## 2022-02-04 ENCOUNTER — Encounter (HOSPITAL_COMMUNITY): Payer: PPO

## 2022-02-06 ENCOUNTER — Encounter (HOSPITAL_COMMUNITY)
Admission: RE | Admit: 2022-02-06 | Discharge: 2022-02-06 | Disposition: A | Discharge status: Home / Self Care | Payer: PPO | Source: Ambulatory Visit | Attending: Cardiology | Admitting: Cardiology

## 2022-02-06 DIAGNOSIS — Z955 Presence of coronary angioplasty implant and graft: Secondary | ICD-10-CM

## 2022-02-06 DIAGNOSIS — I214 Non-ST elevation (NSTEMI) myocardial infarction: Secondary | ICD-10-CM | POA: Diagnosis not present

## 2022-02-06 NOTE — Progress Notes (Signed)
Daily Session Note  Patient Details  Name: Randy S Bischof Jr. MRN: [id number] Date of Birth: 09/09/1950 Referring Provider:   Flowsheet Row CARDIAC REHAB PHASE II ORIENTATION from 01/06/2022 in [place] [facility]  Referring Provider Dr. Pokhrel       Encounter Date: 02/06/2022  Check In:  Session Check In - 02/06/22 0926       Check-In   Supervising physician immediately available to respond to emergencies [facility] MD immediately available    Physician(s) Dr. Ross    Location [facility]    Staff Present Dalton Fletcher, MS, ACSM-CEP, Exercise Physiologist;Heather Jachimiak, BS, Exercise Physiologist    Virtual Visit No    Medication changes reported     No    Fall or balance concerns reported    Yes    Comments He has fallen twice due to dizziness and vertigo. He also has diabetic neuropathy which alters his balance as well.    Tobacco Cessation No Change    Warm-up and Cool-down Performed as group-led instruction    Resistance Training Performed Yes    VAD Patient? No    PAD/SET Patient? No      Pain Assessment   Currently in Pain? No/denies    Pain Score 0-No pain    Multiple Pain Sites No             Capillary Blood Glucose: No results found for this or any previous visit (from the past 24 hour(s)).    Social History   Tobacco Use  Smoking Status Former   Packs/day: 1.00   Years: 25.00   Total pack years: 25.00   Types: Cigarettes   Quit date: 04/12/1992   Years since quitting: 29.8  Smokeless Tobacco Never    Goals Met:  Independence with exercise equipment Exercise tolerated well No report of concerns or symptoms today Strength training completed today  Goals Unmet:  Not Applicable  Comments: checkout time is 1030   Dr. Jonathan Branch is Medical Director for [facility] Card[facility] Re[facility] 

## 2022-02-09 ENCOUNTER — Encounter (HOSPITAL_COMMUNITY)
Admission: RE | Admit: 2022-02-09 | Discharge: 2022-02-09 | Disposition: A | Discharge status: Home / Self Care | Payer: PPO | Source: Ambulatory Visit | Attending: Cardiology | Admitting: Cardiology

## 2022-02-09 VITALS — Wt 210.3 lb

## 2022-02-09 DIAGNOSIS — Z955 Presence of coronary angioplasty implant and graft: Secondary | ICD-10-CM

## 2022-02-09 DIAGNOSIS — I214 Non-ST elevation (NSTEMI) myocardial infarction: Secondary | ICD-10-CM | POA: Diagnosis not present

## 2022-02-09 NOTE — Progress Notes (Signed)
Daily Session Note  Patient Details  Name: Randy S Cratty Jr. MRN: [id number] Date of Birth: 05/21/1951 Referring Provider:   Flowsheet Row CARDIAC REHAB PHASE II ORIENTATION from 01/06/2022 in [place] CARDI[facility] RE[facility]ILITATION  Referring Provider Dr. Pokhrel       Encounter Date: 02/09/2022  Check In:  Session Check In - 02/09/22 0930       Check-In   Supervising physician immediately available to respond to emergencies [facility] MD immediately available    Physician(s) Dr. Pemberton    Location [facility]    Staff Present Dalton Fletcher, MS, ACSM-CEP, Exercise Physiologist;Heather Jachimiak, BS, Exercise Physiologist;Daphyne Martin, RN, BSN    Medication changes reported     No    Tobacco Cessation No Change    Warm-up and Cool-down Performed as group-led instruction    Resistance Training Performed Yes    VAD Patient? No    PAD/SET Patient? No      Pain Assessment   Currently in Pain? No/denies    Pain Score 0-No pain    Multiple Pain Sites No             Capillary Blood Glucose: No results found for this or any previous visit (from the past 24 hour(s)).    Social History   Tobacco Use  Smoking Status Former   Packs/day: 1.00   Years: 25.00   Total pack years: 25.00   Types: Cigarettes   Quit date: 04/12/1992   Years since quitting: 29.8  Smokeless Tobacco Never    Goals Met:  Independence with exercise equipment Exercise tolerated well No report of concerns or symptoms today Strength training completed today  Goals Unmet:  Not Applicable  Comments: check out 1030   Dr. Jonathan Branch is Medical Director for [facility] 

## 2022-02-11 ENCOUNTER — Encounter (HOSPITAL_COMMUNITY)
Admission: RE | Admit: 2022-02-11 | Discharge: 2022-02-11 | Disposition: A | Discharge status: Home / Self Care | Payer: PPO | Source: Ambulatory Visit | Attending: Cardiology | Admitting: Cardiology

## 2022-02-11 DIAGNOSIS — I214 Non-ST elevation (NSTEMI) myocardial infarction: Secondary | ICD-10-CM

## 2022-02-11 DIAGNOSIS — Z955 Presence of coronary angioplasty implant and graft: Secondary | ICD-10-CM

## 2022-02-11 NOTE — Progress Notes (Signed)
Cardiac Individual Treatment Plan  Patient Details  Name: Hasan Douse. MRN: [id number] Date of Birth: 03/14/1951 Referring Provider:   Flowsheet Row CARDIAC REHAB PHASE II ORIENTATION from 01/06/2022 in [facility]  Referring Provider Dr. Louanne Belton       Initial Encounter Date:  Flowsheet Row CARDIAC REHAB PHASE II ORIENTATION from 01/06/2022 in [facility]  Date 01/06/22       Visit Diagnosis: NSTEMI (non-ST elevated myocardial infarction) [facility])  Status post coronary artery stent placement  Patient's Home Medications on Admission:  Current Outpatient Medications:    aspirin EC 81 MG tablet, Take 81 mg by mouth daily., Disp: , Rfl:    atorvastatin (LIPITOR) 80 MG tablet, TAKE ONE TABLET BY MOUTH EVERYDAY AT BEDTIME, Disp: 90 tablet, Rfl: 1   Blood Glucose Monitoring Suppl (ONE TOUCH ULTRA 2) w/Device KIT, by Does not apply route. (Patient not taking: Reported on 01/20/2022), Disp: , Rfl:    carvedilol (COREG) 12.5 MG tablet, TAKE ONE TABLET BY MOUTH EVERY MORNING and TAKE ONE TABLET BY MOUTH EVERYDAY AT BEDTIME (Patient taking differently: Take 12.5 mg by mouth 2 (two) times daily with a meal.), Disp: 180 tablet, Rfl: 1   clopidogrel (PLAVIX) 75 MG tablet, Take 1 tablet (75 mg total) by mouth daily., Disp: 90 tablet, Rfl: 3   Continuous Blood Gluc Sensor (FREESTYLE LIBRE 2 SENSOR) MISC, 1 Device by Does not apply route every 14 (fourteen) days., Disp: 6 each, Rfl: 3   dapagliflozin propanediol (FARXIGA) 10 MG TABS tablet, Take 1 tablet (10 mg total) by mouth daily., Disp: 90 tablet, Rfl: 3   FEROSUL 325 (65 Fe) MG tablet, TAKE 1 TABLET BY MOUTH DAILY, Disp: 30 tablet, Rfl: 3   FLUoxetine (PROZAC) 40 MG capsule, Take 1 capsule (40 mg total) by mouth daily., Disp: 30 capsule, Rfl: 10   glucose blood (ONETOUCH ULTRA) test strip, Check blood sugar 2-3 times daily.  Dx Code: E11.9 (Patient not taking: Reported on 01/20/2022), Disp: 300 each, Rfl:  12   insulin glargine (LANTUS SOLOSTAR) 100 UNIT/ML Solostar Pen, Inject 20 Units into the skin daily., Disp: 30 mL, Rfl: 3   magnesium oxide (MAG-OX) 400 MG tablet, TAKE ONE TABLET BY MOUTH EVERY MORNING and TAKE ONE TABLET BY MOUTH EVERYDAY AT BEDTIME (Patient taking differently: Take 400 mg by mouth 2 (two) times daily.), Disp: 180 tablet, Rfl: 1   metFORMIN (GLUCOPHAGE) 1000 MG tablet, Take 1 tablet (1,000 mg total) by mouth 2 (two) times daily with a meal., Disp: 180 tablet, Rfl: 3   Multiple Vitamins-Minerals (MULTIVITAMIN WITH MINERALS) tablet, Take 1 tablet by mouth daily., Disp: , Rfl:    nitroGLYCERIN (NITROSTAT) 0.4 MG SL tablet, Place 1 tablet (0.4 mg total) under the tongue every 5 (five) minutes as needed for chest pain., Disp: 25 tablet, Rfl: 3   pantoprazole (PROTONIX) 40 MG tablet, TAKE ONE TABLET BY MOUTH EVERY MORNING and TAKE ONE TABLET BY MOUTH EVERYDAY AT BEDTIME (Patient taking differently: Take 40 mg by mouth 2 (two) times daily.), Disp: 180 tablet, Rfl: 1   Potassium 99 MG TABS, Take 99 mg by mouth daily., Disp: , Rfl:    primidone (MYSOLINE) 50 MG tablet, TAKE THREE TABLETS BY MOUTH EVERY MORNING and TAKE TWO TABLETS BY MOUTH EVERYDAY AT BEDTIME (Patient taking differently: Take 100-150 mg by mouth See admin instructions. TAKE THREE TABLETS BY MOUTH EVERY MORNING and TAKE TWO TABLETS BY MOUTH EVERYDAY AT BEDTIME), Disp: 450 tablet, Rfl: 1  sacubitril-valsartan (ENTRESTO) 24-26 MG, Take 1 tablet by mouth 2 (two) times daily., Disp: 180 tablet, Rfl: 3   spironolactone (ALDACTONE) 25 MG tablet, Take 0.5 tablets (12.5 mg total) by mouth daily., Disp: 45 tablet, Rfl: 3   vitamin B-12 (CYANOCOBALAMIN) 1000 MCG tablet, Take 1,000 mcg by mouth daily., Disp: , Rfl:   Past Medical History: Past Medical History:  Diagnosis Date   Anxiety    Arthritis    BCC (basal cell carcinoma of skin) 12/2020   CAD (coronary artery disease)    a. NSTEMI 12/12: EF 40-45%. TKP:[id number] balloon  PTCA + Promus DES x 1 to mid LAD;  b. 11/2012: Cath with Cutting balloon PTCA  LAD for ISR to LAD, EF 55%;  c. 12/2012 Cath/PCI: LAD stents patent, 80 ost Diag (jailed)->PTCA, LCX/RCA patent.   Essential hypertension    GI bleed    History of blood transfusion    was getting 4 units   Hyperlipidemia    Ischemic cardiomyopathy    a.  echo 08/06/11: dist ant wall, apical and septal and infero-apical HK, mild LVH, EF 40%, mild LAE, PASP 34, asc aorta mildly dilated, mild TR;  b.  Echo (3/13) showed recovery of LV systolic function with EF 27-51%, grade I diastolic dysfunction, mild MR.    Myocardial infarction [facility])    twice with NSTEMI 2012   Palpitations    Type 2 diabetes mellitus (HCC)     Tobacco Use: Social History   Tobacco Use  Smoking Status Former   Packs/day: 1.00   Years: 25.00   Total pack years: 25.00   Types: Cigarettes   Quit date: 04/12/1992   Years since quitting: 29.8  Smokeless Tobacco Never    Labs: Review Flowsheet  More data exists      Latest Ref Rng & Units 01/07/2021 04/17/2021 07/15/2021 11/18/2021  Labs for ITP Cardiac and Pulmonary Rehab  Cholestrol 0 - 200 mg/dL - 110  - -  LDL (calc) 0 - 99 mg/dL - 46  - -  HDL-C >39.00 mg/dL - 43.80  - -  Trlycerides 0.0 - 149.0 mg/dL - 102.0  - -  Hemoglobin A1c 4.0 - 5.6 % 8.4  - 9.1  7.8       11/27/2021  Labs for ITP Cardiac and Pulmonary Rehab  Cholestrol 110   LDL (calc) 40   HDL-C 45.40   Trlycerides 121.0   Hemoglobin A1c -    Capillary Blood Glucose: Lab Results  Component Value Date   GLUCAP 177 (H) 12/09/2021   GLUCAP 169 (H) 12/08/2021   GLUCAP 240 (H) 12/08/2021   GLUCAP 136 (H) 12/08/2021   GLUCAP 157 (H) 12/08/2021    POCT Glucose     Row Name 01/06/22 0819             POCT Blood Glucose   Pre-Exercise 147 mg/dL                Exercise Target Goals: Exercise Program Goal: Individual exercise prescription set using results from initial 6 min walk test and THRR while  considering  patient's activity barriers and safety.   Exercise Prescription Goal: Starting with aerobic activity 30 plus minutes a day, 3 days per week for initial exercise prescription. Provide home exercise prescription and guidelines that participant acknowledges understanding prior to discharge.  Activity Barriers & Risk Stratification:  Activity Barriers & Cardiac Risk Stratification - 01/06/22 0833       Activity Barriers & Cardiac Risk Stratification  Activity Barriers Arthritis;Joint Problems;Deconditioning;Balance Concerns;History of Falls    Cardiac Risk Stratification High             6 Minute Walk:  6 Minute Walk     Row Name 01/06/22 0834         6 Minute Walk   Phase Initial     Distance 1000 feet     Walk Time 6 minutes     # of Rest Breaks 0     MPH 1.89     METS 2.18     RPE 13     VO2 Peak 63     Symptoms Yes (comment)     Comments L knee pain 3/10     Resting HR 62 bpm     Resting BP 120/76     Resting Oxygen Saturation  98 %     Exercise Oxygen Saturation  during 6 min walk 98 %     Max Ex. HR 69 bpm     Max Ex. BP 130/70     2 Minute Post BP 120/66              Oxygen Initial Assessment:   Oxygen Re-Evaluation:   Oxygen Discharge (Final Oxygen Re-Evaluation):   Initial Exercise Prescription:  Initial Exercise Prescription - 01/06/22 0800       Date of Initial Exercise RX and Referring Provider   Date 01/06/22    Referring Provider Dr. Louanne Belton    Expected Discharge Date 04/03/22      Treadmill   MPH 1    Grade 0    Minutes 17      Recumbant Elliptical   Level 1    RPM 40    Minutes 22      Prescription Details   Frequency (times per week) 3    Duration Progress to 30 minutes of continuous aerobic without signs/symptoms of physical distress      Intensity   THRR 40-80% of Max Heartrate 60-120    Ratings of Perceived Exertion 11-13      Resistance Training   Training Prescription Yes    Weight 4    Reps  10-15             Perform Capillary Blood Glucose checks as needed.  Exercise Prescription Changes:   Exercise Prescription Changes     Row Name 01/12/22 1100 01/16/22 1000 01/26/22 1500 02/09/22 1000       Response to Exercise   Blood Pressure (Admit) 120/56 -- 140/70 120/60    Blood Pressure (Exercise) 130/60 -- 127/62 122/60    Blood Pressure (Exit) 120/58 -- 118/54 92/56    Heart Rate (Admit) 61 bpm -- 57 bpm 62 bpm    Heart Rate (Exercise) 71 bpm -- 77 bpm 77 bpm    Heart Rate (Exit) 63 bpm -- 66 bpm 69 bpm    Rating of Perceived Exertion (Exercise) 12 -- 12 13    Duration Continue with 30 min of aerobic exercise without signs/symptoms of physical distress. -- Continue with 30 min of aerobic exercise without signs/symptoms of physical distress. Continue with 30 min of aerobic exercise without signs/symptoms of physical distress.    Intensity THRR unchanged -- THRR unchanged THRR unchanged      Progression   Progression Continue to progress workloads to maintain intensity without signs/symptoms of physical distress. -- Continue to progress workloads to maintain intensity without signs/symptoms of physical distress. Continue to progress workloads to maintain intensity without signs/symptoms of  physical distress.      Resistance Training   Training Prescription Yes -- Yes Yes    Weight 3 -- 5 5    Reps 10-15 -- 10-15 10-15    Time 10 Minutes -- 10 Minutes 10 Minutes      Treadmill   MPH 1 -- 1.8 2    Grade 0 -- 0 1    Minutes 17 -- 17 17    METs 1.77 -- 2.38 2.81      Recumbant Elliptical   Level 1 -- 1 3    RPM 42 -- 58 57    Minutes 22 -- 22 22    METs 2.3 -- 3.4 3.4      Home Exercise Plan   Plans to continue exercise at -- Home (comment) -- --    Frequency -- Add 2 additional days to program exercise sessions. -- --    Initial Home Exercises Provided -- 01/16/22 -- --             Exercise Comments:   Exercise Comments     Row Name 01/16/22 1018            Exercise Comments home exercise reviewed                Exercise Goals and Review:   Exercise Goals     [place] Name 01/12/22 1142 02/09/22 1042           Exercise Goals   Increase Physical Activity Yes Yes      Intervention Provide advice, education, support and counseling about physical activity/exercise needs.;Develop an individualized exercise prescription for aerobic and resistive training based on initial evaluation findings, risk stratification, comorbidities and participant's personal goals. Provide advice, education, support and counseling about physical activity/exercise needs.;Develop an individualized exercise prescription for aerobic and resistive training based on initial evaluation findings, risk stratification, comorbidities and participant's personal goals.      Expected Outcomes Short Term: Attend rehab on a regular basis to increase amount of physical activity.;Long Term: Add in home exercise to make exercise part of routine and to increase amount of physical activity.;Long Term: Exercising regularly at least 3-5 days a week. Short Term: Attend rehab on a regular basis to increase amount of physical activity.;Long Term: Add in home exercise to make exercise part of routine and to increase amount of physical activity.;Long Term: Exercising regularly at least 3-5 days a week.      Increase Strength and Stamina Yes Yes      Intervention Provide advice, education, support and counseling about physical activity/exercise needs.;Develop an individualized exercise prescription for aerobic and resistive training based on initial evaluation findings, risk stratification, comorbidities and participant's personal goals. Provide advice, education, support and counseling about physical activity/exercise needs.;Develop an individualized exercise prescription for aerobic and resistive training based on initial evaluation findings, risk stratification, comorbidities and participant's  personal goals.      Expected Outcomes Short Term: Increase workloads from initial exercise prescription for resistance, speed, and METs.;Short Term: Perform resistance training exercises routinely during rehab and add in resistance training at home;Long Term: Improve cardiorespiratory fitness, muscular endurance and strength as measured by increased METs and functional capacity (6MWT) Short Term: Increase workloads from initial exercise prescription for resistance, speed, and METs.;Short Term: Perform resistance training exercises routinely during rehab and add in resistance training at home;Long Term: Improve cardiorespiratory fitness, muscular endurance and strength as measured by increased METs and functional capacity (6MWT)      Able to  understand and use rate of perceived exertion (RPE) scale Yes Yes      Intervention Provide education and explanation on how to use RPE scale Provide education and explanation on how to use RPE scale      Expected Outcomes Short Term: Able to use RPE daily in rehab to express subjective intensity level;Long Term:  Able to use RPE to guide intensity level when exercising independently Short Term: Able to use RPE daily in rehab to express subjective intensity level;Long Term:  Able to use RPE to guide intensity level when exercising independently      Knowledge and understanding of Target Heart Rate Range (THRR) Yes Yes      Intervention Provide education and explanation of THRR including how the numbers were predicted and where they are located for reference Provide education and explanation of THRR including how the numbers were predicted and where they are located for reference      Expected Outcomes Short Term: Able to state/look up THRR;Long Term: Able to use THRR to govern intensity when exercising independently;Short Term: Able to use daily as guideline for intensity in rehab Short Term: Able to state/look up THRR;Long Term: Able to use THRR to govern intensity when  exercising independently;Short Term: Able to use daily as guideline for intensity in rehab      Able to check pulse independently Yes Yes      Intervention Provide education and demonstration on how to check pulse in carotid and radial arteries.;Review the importance of being able to check your own pulse for safety during independent exercise Provide education and demonstration on how to check pulse in carotid and radial arteries.;Review the importance of being able to check your own pulse for safety during independent exercise      Expected Outcomes Short Term: Able to explain why pulse checking is important during independent exercise;Long Term: Able to check pulse independently and accurately Short Term: Able to explain why pulse checking is important during independent exercise;Long Term: Able to check pulse independently and accurately      Understanding of Exercise Prescription Yes Yes      Intervention Provide education, explanation, and written materials on patient's individual exercise prescription Provide education, explanation, and written materials on patient's individual exercise prescription      Expected Outcomes Short Term: Able to explain program exercise prescription;Long Term: Able to explain home exercise prescription to exercise independently Short Term: Able to explain program exercise prescription;Long Term: Able to explain home exercise prescription to exercise independently               Exercise Goals Re-Evaluation :  Exercise Goals Re-Evaluation     Row Name 01/12/22 1151 02/09/22 1042           Exercise Goal Re-Evaluation   Exercise Goals Review Increase Physical Activity;Increase Strength and Stamina;Able to understand and use rate of perceived exertion (RPE) scale;Knowledge and understanding of Target Heart Rate Range (THRR);Able to check pulse independently;Understanding of Exercise Prescription Increase Physical Activity;Increase Strength and Stamina;Able to  understand and use rate of perceived exertion (RPE) scale;Knowledge and understanding of Target Heart Rate Range (THRR);Able to check pulse independently;Understanding of Exercise Prescription      Comments Pt has completed his first session of cardiac rehab. He is motviated amd ready to improve his strength. He is currently exercising at 2.3 METs on the ellp. Will continue to monitor and progress as able. Pt has completed 12 sessions of PR. He has been able to progress his  workloads on both exercise stations and is motivated to continue to progress in the program. He is currently exercising at 3.4 METs on the seated elliptical. Will continue to monitor and progress as able.      Expected Outcomes Through exercise at home and at rehab, the patient will meet their stated goals. Through exercise at home and at rehab, the patient will meet their stated goals.                Discharge Exercise Prescription (Final Exercise Prescription Changes):  Exercise Prescription Changes - 02/09/22 1000       Response to Exercise   Blood Pressure (Admit) 120/60    Blood Pressure (Exercise) 122/60    Blood Pressure (Exit) 92/56    Heart Rate (Admit) 62 bpm    Heart Rate (Exercise) 77 bpm    Heart Rate (Exit) 69 bpm    Rating of Perceived Exertion (Exercise) 13    Duration Continue with 30 min of aerobic exercise without signs/symptoms of physical distress.    Intensity THRR unchanged      Progression   Progression Continue to progress workloads to maintain intensity without signs/symptoms of physical distress.      Resistance Training   Training Prescription Yes    Weight 5    Reps 10-15    Time 10 Minutes      Treadmill   MPH 2    Grade 1    Minutes 17    METs 2.81      Recumbant Elliptical   Level 3    RPM 57    Minutes 22    METs 3.4             Nutrition:  Target Goals: Understanding of nutrition guidelines, daily intake of sodium <1548m, cholesterol <2077m calories 30% from  fat and 7% or less from saturated fats, daily to have 5 or more servings of fruits and vegetables.  Biometrics:    Nutrition Therapy Plan and Nutrition Goals:  Nutrition Therapy & Goals - 01/12/22 0911       Personal Nutrition Goals   Comments Patient scored 61 on his diet assessment. We offer 2 educational sessions on heart healthy nutrition with handouts.      Intervention Plan   Intervention Nutrition handout(s) given to patient.    Expected Outcomes Short Term Goal: Understand basic principles of dietary content, such as calories, fat, sodium, cholesterol and nutrients.             Nutrition Assessments:  Nutrition Assessments - 01/06/22 0809       MEDFICTS Scores   Pre Score 61            MEDIFICTS Score Key: ?70 Need to make dietary changes  40-70 Heart Healthy Diet ? 40 Therapeutic Level Cholesterol Diet   Picture Your Plate Scores: <4<31nhealthy dietary pattern with much room for improvement. 41-50 Dietary pattern unlikely to meet recommendations for good health and room for improvement. 51-60 More healthful dietary pattern, with some room for improvement.  >60 Healthy dietary pattern, although there may be some specific behaviors that could be improved.    Nutrition Goals Re-Evaluation:   Nutrition Goals Discharge (Final Nutrition Goals Re-Evaluation):   Psychosocial: Target Goals: Acknowledge presence or absence of significant depression and/or stress, maximize coping skills, provide positive support system. Participant is able to verbalize types and ability to use techniques and skills needed for reducing stress and depression.  Initial Review & Psychosocial Screening:  Initial  Psych Review & Screening - 01/06/22 0837       Initial Review   Current issues with Current Anxiety/Panic      Family Dynamics   Good Support System? Yes    Comments His wife and two children are his support system.      Barriers   Psychosocial barriers to  participate in program There are no identifiable barriers or psychosocial needs.      Screening Interventions   Interventions Encouraged to exercise    Expected Outcomes Long Term goal: The participant improves quality of Life and PHQ9 Scores as seen by post scores and/or verbalization of changes;Short Term goal: Identification and review with participant of any Quality of Life or Depression concerns found by scoring the questionnaire.             Quality of Life Scores:  Quality of Life - 01/06/22 0837       Quality of Life   Select Quality of Life      Quality of Life Scores   Health/Function Pre 25.2 %    Socioeconomic Pre 28.29 %    Psych/Spiritual Pre 30 %    Family Pre 27.6 %    GLOBAL Pre 27.18 %            Scores of 19 and below usually indicate a poorer quality of life in these areas.  A difference of  2-3 points is a clinically meaningful difference.  A difference of 2-3 points in the total score of the Quality of Life Index has been associated with significant improvement in overall quality of life, self-image, physical symptoms, and general health in studies assessing change in quality of life.  PHQ-9: Review Flowsheet  More data exists      01/06/2022 11/27/2021 10/21/2021 07/23/2021 06/02/2021  Depression screen PHQ 2/9  Decreased Interest 0 0 1 0 0  Down, Depressed, Hopeless 0 0 0 0 0  PHQ - 2 Score 0 0 1 0 0  Altered sleeping 0 - 1 - -  Tired, decreased energy 2 - 2 - -  Change in appetite 1 - 1 - -  Feeling bad or failure about yourself  0 - 0 - -  Trouble concentrating 0 - 1 - -  Moving slowly or fidgety/restless 0 - 0 - -  Suicidal thoughts 0 - 0 - -  PHQ-9 Score 3 - 6 - -  Difficult doing work/chores Somewhat difficult - Somewhat difficult - -   Interpretation of Total Score  Total Score Depression Severity:  1-4 = Minimal depression, 5-9 = Mild depression, 10-14 = Moderate depression, 15-19 = Moderately severe depression, 20-27 = Severe  depression   Psychosocial Evaluation and Intervention:  Psychosocial Evaluation - 01/06/22 0904       Psychosocial Evaluation & Interventions   Interventions Encouraged to exercise with the program and follow exercise prescription    Comments Pt has no barriers to participating in CR. He is treated for anxiety with fluoxetine 40 mg. He has been treated for anxiety for several years now, and this medication helps him and that his anxiety is under control. He has no other identifiable psychosocial issues. 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 He also reports not having much of an appetite. He underwent a gastric bypass surgery several years ago and lost over 100 lbs. He reports that his appetite is small due to this. He reports that  he has a good support system with his wife, children, and grandchildren. He lives with his wife, and his the rest of his family lives close by. He states that his goals while in the program are to improve his strength and stamina. He is eager to start the program.    Expected Outcomes Pt's anxiety will continue to be medically managed, and he will have no other identifiable psychosocial issues.    Continue Psychosocial Services  No Follow up required             Psychosocial Re-Evaluation:  Psychosocial Re-Evaluation     Albert Lea Name 01/12/22 [phone] 02/02/22 0719           Psychosocial Re-Evaluation   Current issues with Current Anxiety/Panic Current Anxiety/Panic      Comments Patient is new to the program. He plans to start today. His anxiety is managed with Fluoxetine. We will continue to monitor his progress. Patient has completed 9 sessions. He is doing well enjoying the program interating with staff and other patient's in his class. His anxiety continues to be managed with Fluoxetine and he continues to have no psychosocial barriers identified. We will continue to monitor.      Expected Outcomes  Patient will continue to have no psychosocial barriers identified and his anxiety will continue to be managed. Patient will continue to have no psychosocial barriers identified and his anxiety will continue to be managed.      Interventions Stress management education;Encouraged to attend Cardiac Rehabilitation for the exercise;Relaxation education Stress management education;Encouraged to attend Cardiac Rehabilitation for the exercise;Relaxation education      Continue Psychosocial Services  No Follow up required No Follow up required               Psychosocial Discharge (Final Psychosocial Re-Evaluation):  Psychosocial Re-Evaluation - 02/02/22 0719       Psychosocial Re-Evaluation   Current issues with Current Anxiety/Panic    Comments Patient has completed 9 sessions. He is doing well enjoying the program interating with staff and other patient's in his class. His anxiety continues to be managed with Fluoxetine and he continues to have no psychosocial barriers identified. We will continue to monitor.    Expected Outcomes Patient will continue to have no psychosocial barriers identified and his anxiety will continue to be managed.    Interventions Stress management education;Encouraged to attend Cardiac Rehabilitation for the exercise;Relaxation education    Continue Psychosocial Services  No Follow up required             Vocational Rehabilitation: Provide vocational rehab assistance to qualifying candidates.   Vocational Rehab Evaluation & Intervention:  Vocational Rehab - 01/06/22 0808       Initial Vocational Rehab Evaluation & Intervention   Assessment shows need for Vocational Rehabilitation No      Vocational Rehab Re-Evaulation   Comments He is retired and not interested in returning to work             Education: Education Goals: Education classes will be provided on a weekly basis, covering required topics. Participant will state understanding/return  demonstration of topics presented.  Learning Barriers/Preferences:  Learning Barriers/Preferences - 01/06/22 0840       Learning Barriers/Preferences   Learning Barriers None    Learning Preferences Skilled Demonstration             Education Topics: Hypertension, Hypertension Reduction -Define heart disease and high blood pressure. Discus how high blood pressure affects the body and  ways to reduce high blood pressure.   Exercise and Your Heart -Discuss why it is important to exercise, the FITT principles of exercise, normal and abnormal responses to exercise, and how to exercise safely.   Angina -Discuss definition of angina, causes of angina, treatment of angina, and how to decrease risk of having angina.   Cardiac Medications -Review what the following cardiac medications are used for, how they affect the body, and side effects that may occur when taking the medications.  Medications include Aspirin, Beta blockers, calcium channel blockers, ACE Inhibitors, angiotensin receptor blockers, diuretics, digoxin, and antihyperlipidemics.   Congestive Heart Failure -Discuss the definition of CHF, how to live with CHF, the signs and symptoms of CHF, and how keep track of weight and sodium intake.   Heart Disease and Intimacy -Discus the effect sexual activity has on the heart, how changes occur during intimacy as we age, and safety during sexual activity.   Smoking Cessation / COPD -Discuss different methods to quit smoking, the health benefits of quitting smoking, and the definition of COPD.   Nutrition I: Fats -Discuss the types of cholesterol, what cholesterol does to the heart, and how cholesterol levels can be controlled. Flowsheet Row CARDIAC REHAB PHASE II EXERCISE from 01/28/2022 in [place]  Date 01/14/22  Educator [place]  Instruction Review Code 1- Verbalizes Understanding       Nutrition II: Labels -Discuss the different components of  food labels and how to read food label [place] from 01/28/2022 in [place]  Date 01/28/22  Educator [place]  Instruction Review Code 1- Verbalizes Understanding       Heart Parts/Heart Disease and PAD -Discuss the anatomy of the heart, the pathway of blood circulation through the heart, and these are affected by heart disease.   Stress I: Signs and Symptoms -Discuss the causes of stress, how stress may lead to anxiety and depression, and ways to limit stress.   Stress II: Relaxation -Discuss different types of relaxation techniques to limit stress.   Warning Signs of Stroke / TIA -Discuss definition of a stroke, what the signs and symptoms are of a stroke, and how to identify when someone is having stroke.   Knowledge Questionnaire Score:  Knowledge Questionnaire Score - 01/06/22 0808       Knowledge Questionnaire Score   Pre Score 20/24             Core Components/Risk Factors/Patient Goals at Admission:  Personal Goals and Risk Factors at Admission - 01/06/22 0843       Core Components/Risk Factors/Patient Goals on Admission   Diabetes Yes    Intervention Provide education about signs/symptoms and action to take for hypo/hyperglycemia.;Provide education about proper nutrition, including hydration, and aerobic/resistive exercise prescription along with prescribed medications to achieve blood glucose in normal ranges: Fasting glucose 65-99 mg/dL    Expected Outcomes Short Term: Participant verbalizes understanding of the signs/symptoms and immediate care of hyper/hypoglycemia, proper foot care and importance of medication, aerobic/resistive exercise and nutrition plan for blood glucose control.;Long Term: Attainment of HbA1C < 7%.    Hypertension Yes    Intervention Provide education on lifestyle modifcations including regular physical activity/exercise, weight management, moderate sodium restriction and increased  consumption of fresh fruit, vegetables, and low fat dairy, alcohol moderation, and smoking cessation.;Monitor prescription use compliance.    Expected Outcomes Short Term: Continued assessment and intervention until BP is < 140/40m HG in hypertensive participants. < 130/850m  HG in hypertensive participants with diabetes, heart failure or chronic kidney disease.;Long Term: Maintenance of blood pressure at goal levels.    Lipids Yes    Intervention Provide education and support for participant on nutrition & aerobic/resistive exercise along with prescribed medications to achieve LDL <51m, HDL >44m    Expected Outcomes Short Term: Participant states understanding of desired cholesterol values and is compliant with medications prescribed. Participant is following exercise prescription and nutrition guidelines.;Long Term: Cholesterol controlled with medications as prescribed, with individualized exercise RX and with personalized nutrition plan. Value goals: LDL < 7028mHDL > 40 mg.    Personal Goal Other Yes    Personal Goal Improve strength and stamina    Intervention Attend CR three days per week and exercise at home 2-4 days per week.    Expected Outcomes He will report improvements in his strength and stamina. He will be able to improve his six minute walk test distance at discharge.             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 He has mutiple risk factors for CAD and is participating in the program for risk modification. He plans to start today. His DM is managed with insulin, metformin, and farxiga. His personal goals for the program are to  improve his strength and stamina. We will continue to monior his progress as he works towards meeting these goals. Patient has completed 9 sessions with his current weight 209.1 lbs losing 5 lbs since last 30 day review. He is doing well in the program with consistent attendance and progressions. Last A1C on file was 5/22 at 8.4%. He is on Lantus and Metformin for DM control. His reported home readings have been labile. His blood pressure is at goal. His personal goals for the program continue to be to improve his strength and stamina. We will continue to monitor his progress as he works towards meeting these personal goals.      Expected Outcomes Patient will complete the program meeting both personal and program goals. Patient will complete the program meeting both personal and program goals.               Core Components/Risk Factors/Patient Goals at Discharge (Final Review):   Goals and Risk Factor Review - 02/02/22 0721       Core Components/Risk Factors/Patient Goals Review   Personal Goals Review Diabetes;Lipids;Hypertension;Other;Weight Management/Obesity    Review Patient has completed 9 sessions with his current weight 209.1 lbs losing 5 lbs since last 30 day review. He is doing well in the program with consistent attendance and progressions. Last A1C on file was 5/22 at 8.4%. He is on Lantus and Metformin for DM control. His reported home readings have been labile. His blood pressure is at goal. His personal goals for the program continue to be to improve his strength and stamina. We will continue to monitor his progress as he works towards meeting these personal goals.    Expected Outcomes Patient will complete the program meeting both personal and program goals.             ITP Comments:   Comments: ITP REVIEW Pt is making expected progress toward  Cardiac Rehab goals after completing 12 sessions. Recommend continued exercise, life style modification, education, and increased  stamina and strength.

## 2022-02-11 NOTE — Progress Notes (Signed)
Daily Session Note  Patient Details  Name: Randy Castro. MRN: [id number] Date of Birth: November 13, 1950 Referring Provider:   Flowsheet Row CARDIAC REHAB PHASE II ORIENTATION from 01/06/2022 in [place]  Referring Provider Dr. Louanne Belton       Encounter Date: 02/11/2022  Check In:  Session Check In - 02/11/22 0930       Check-In   Supervising physician immediately available to respond to emergencies [facility] MD immediately available    Physician(s) Dr. Gasper Sells    Location [facility]    Staff Present Redge Gainer, BS, Exercise Physiologist;Dalton Kris Mouton, MS, ACSM-CEP, Exercise Physiologist    Virtual Visit No    Medication changes reported     No    Fall or balance concerns reported    No    Tobacco Cessation No Change    Warm-up and Cool-down Performed as group-led instruction    Resistance Training Performed Yes    VAD Patient? No    PAD/SET Patient? No      Pain Assessment   Currently in Pain? No/denies    Pain Score 0-No pain    Multiple Pain Sites No             Capillary Blood Glucose: No results found for this or any previous visit (from the past 24 hour(s)).    Social History   Tobacco Use  Smoking Status Former   Packs/day: 1.00   Years: 25.00   Total pack years: 25.00   Types: Cigarettes   Quit date: 04/12/1992   Years since quitting: 29.8  Smokeless Tobacco Never    Goals Met:  Independence with exercise equipment Exercise tolerated well No report of concerns or symptoms today Strength training completed today  Goals Unmet:  Not Applicable  Comments: check out 1030   Dr. Carlyle Dolly is Medical Director for [facility]

## 2022-02-13 ENCOUNTER — Encounter (HOSPITAL_COMMUNITY)
Admission: RE | Admit: 2022-02-13 | Discharge: 2022-02-13 | Disposition: A | Discharge status: Home / Self Care | Payer: PPO | Source: Ambulatory Visit | Attending: Cardiology | Admitting: Cardiology

## 2022-02-13 DIAGNOSIS — Z955 Presence of coronary angioplasty implant and graft: Secondary | ICD-10-CM

## 2022-02-13 DIAGNOSIS — I214 Non-ST elevation (NSTEMI) myocardial infarction: Secondary | ICD-10-CM | POA: Diagnosis not present

## 2022-02-16 ENCOUNTER — Other Ambulatory Visit: Payer: Self-pay | Admitting: Pharmacist

## 2022-02-16 ENCOUNTER — Encounter (HOSPITAL_COMMUNITY)
Admission: RE | Admit: 2022-02-16 | Discharge: 2022-02-16 | Disposition: A | Discharge status: Home / Self Care | Payer: PPO | Source: Ambulatory Visit | Attending: Cardiology | Admitting: Cardiology

## 2022-02-16 ENCOUNTER — Ambulatory Visit: Payer: PPO | Admitting: Pharmacist

## 2022-02-16 DIAGNOSIS — I214 Non-ST elevation (NSTEMI) myocardial infarction: Secondary | ICD-10-CM | POA: Diagnosis not present

## 2022-02-16 DIAGNOSIS — Z955 Presence of coronary angioplasty implant and graft: Secondary | ICD-10-CM

## 2022-02-16 DIAGNOSIS — I10 Essential (primary) hypertension: Secondary | ICD-10-CM

## 2022-02-16 DIAGNOSIS — E1165 Type 2 diabetes mellitus with hyperglycemia: Secondary | ICD-10-CM

## 2022-02-16 DIAGNOSIS — I5022 Chronic systolic (congestive) heart failure: Secondary | ICD-10-CM

## 2022-02-16 MED ORDER — SACUBITRIL-VALSARTAN 24-26 MG PO TABS: 1.0000 | ORAL_TABLET | Freq: Two times a day (BID) | ORAL | 1 refills | Status: DC

## 2022-02-18 ENCOUNTER — Encounter (HOSPITAL_COMMUNITY): Payer: PPO

## 2022-02-19 ENCOUNTER — Ambulatory Visit (INDEPENDENT_AMBULATORY_CARE_PROVIDER_SITE_OTHER): Payer: PPO | Admitting: Pharmacist

## 2022-02-19 DIAGNOSIS — Z794 Long term (current) use of insulin: Secondary | ICD-10-CM

## 2022-02-20 ENCOUNTER — Encounter (HOSPITAL_COMMUNITY): Payer: PPO

## 2022-02-20 DIAGNOSIS — I2510 Atherosclerotic heart disease of native coronary artery without angina pectoris: Secondary | ICD-10-CM

## 2022-02-20 DIAGNOSIS — Z794 Long term (current) use of insulin: Secondary | ICD-10-CM | POA: Diagnosis not present

## 2022-02-20 DIAGNOSIS — I509 Heart failure, unspecified: Secondary | ICD-10-CM | POA: Diagnosis not present

## 2022-02-20 DIAGNOSIS — E1159 Type 2 diabetes mellitus with other circulatory complications: Secondary | ICD-10-CM | POA: Diagnosis not present

## 2022-02-20 DIAGNOSIS — F32A Depression, unspecified: Secondary | ICD-10-CM

## 2022-02-20 DIAGNOSIS — Z7984 Long term (current) use of oral hypoglycemic drugs: Secondary | ICD-10-CM | POA: Diagnosis not present

## 2022-02-20 DIAGNOSIS — E1140 Type 2 diabetes mellitus with diabetic neuropathy, unspecified: Secondary | ICD-10-CM | POA: Diagnosis not present

## 2022-02-20 DIAGNOSIS — I110 Hypertensive heart disease with heart failure: Secondary | ICD-10-CM

## 2022-02-20 NOTE — Chronic Care Management (AMB) (Signed)
Chronic Care Management Pharmacy Note  02/18/2022 Name:  Randy Castro. MRN:  [id number] DOB:  1950-11-11  Summary:  Patient asks about his wife  "attaching" to his Elenor Legato account to be able to review his readings. He has the Freestyle 2 and it does not have the ability for more then 1 device to connect to the sensor. Discussed that she could use the [employer] app to connect to his LibreView account and she would then be able to see his reports / readings.  Mailed directions on how to [employer] and then connect to patient's [place].   Subjective: Randy Castro. is an 71 y.o. year old male who is a primary patient of Paz, Alda Berthold, MD.  The CCM team was consulted for assistance with disease management and care coordination needs.    Engaged with patient and his wife by telephone for  follow up and Continuous Glucose Monitor education  in response to provider referral for pharmacy case management and/or care coordination services.   Consent to Services:  The patient was given information about Chronic Care Management services, agreed to services, and gave verbal consent prior to initiation of services.  Please see initial visit note for detailed documentation.   Patient Care Team: Colon Branch, MD as PCP - General (Internal Medicine) Satira Sark, MD as PCP - Cardiology (Cardiology) Larey Dresser, MD as Consulting Physician (Cardiology) Lake Bells., MD as Referring Physician (Gastroenterology) Gearlean Alf, PA-C as Physician Assistant (Pulmonary Disease) Demetrius Revel, MD as Consulting Physician (Bariatrics) [name], [name], DO as Consulting Physician (Neurology) [name], [place] (Pharmacist) Madelin Headings, DO (Optometry)  Recent office visits: 11/27/2021 - Int Med (Dr Larose Kells) annual physical exam. Labs checked. No med changes noted.  10/21/2021 - Int Med (Dr Larose Kells) [place] for acute visit - dizziness. Ordered MRI. MRI showed No acute intracranial  process. No etiology is seen for the patient's dizziness. Referred to physical therapy for benign paroxysmal vertigo. 07/23/2021 - Int Med (Dr Larose Kells) F/U anxiety and depression. Improved with fluoxetine 31m daily. BP was at goal. F/U 3 to 4 months  Recent consult visits: 12/22/2021 - Cardio (Gilford Rile NP) F/U hospitalization. Increased farxiga to 198mdaily. Labs checked. After normal CMP- spironolactone 12.39m31maily started.  11/20/2021 - Cardio (Dr McDDomenic Polite/U CAD. No med changes noted.  11/18/2021 - Endo (Dr ShaKelton Pillar/U diabetes. A1c down form 9.2% to 7.8%. Changed from 70/30 mix insulin to Lantus 20 units daily. Continued metformin and Farxiga at current dosese. Plan to get [place] F/U 6 months. 07/15/2021 - Endo (Dr ShaKelton Pillar/U diabetes. A1c was 9.1. Started Farxiga 39mg37mily before breakfast. BP also noted to be elevated.   [facility]: [facility] 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  New medications stated at discharge: clopidogrel 739mg73mly for 1 year. Entresto 24/26mg 71me a day Medications stopped at discharge: losartan 50mg  56mctive:  Lab Results  Component Value Date   CREATININE 0.84 01/20/2022   CREATININE 0.67 (L) 12/22/2021   CREATININE 0.72 12/09/2021    Lab Results  Component Value Date   HGBA1C 7.8 (A) 11/18/2021   Last diabetic Eye exam:  Lab Results  Component Value Date/Time   HMDIABEYEEXA No Retinopathy 06/18/2021 12:00 AM    Last diabetic Foot exam:  Lab Results  Component Value Date/Time   HMDIABFOOTEX Done 03/19/2019 12:00 AM  Component Value Date/Time   CHOL 110 11/27/2021 0930   TRIG 121.0 11/27/2021 0930   HDL 45.40 11/27/2021 0930   CHOLHDL 2 11/27/2021 0930   VLDL 24.2 11/27/2021 0930   LDLCALC 40 11/27/2021 0930   LDLDIRECT 145.0 04/26/2018 1026       Latest Ref Rng & Units 12/07/2021   12:40 AM 12/06/2021    5:26 AM  07/23/2021    9:20 AM  Hepatic Function  Total Protein 6.5 - 8.1 g/dL 5.3  6.3  5.9   Albumin 3.5 - 5.0 g/dL 3.0  3.6  3.9   AST 15 - 41 U/L 30  43  26   ALT 0 - 44 U/L 32  42  37   Alk Phosphatase 38 - 126 U/L 53  61  60   Total Bilirubin 0.3 - 1.2 mg/dL 0.6  0.4  0.5   Bilirubin, Direct 0.0 - 0.3 mg/dL   0.1     Lab Results  Component Value Date/Time   TSH 1.11 04/17/2021 09:19 AM   TSH 0.92 04/26/2018 10:26 AM       Latest Ref Rng & Units 12/22/2021   11:48 AM 12/09/2021    4:13 AM 12/08/2021    4:12 AM  CBC  WBC 3.4 - 10.8 x10E3/uL 7.9  6.1  8.0   Hemoglobin 13.0 - 17.7 g/dL 13.6  11.5  11.4   Hematocrit 37.5 - 51.0 % 41.7  34.3  34.6   Platelets 150 - 450 x10E3/uL 293  207  236     No results found for: "VD25OH"  Clinical ASCVD: Yes  The ASCVD Risk score (Arnett DK, et al., 2019) failed to calculate for the following reasons:   The patient has a prior MI or stroke diagnosis     Social History   Tobacco Use  Smoking Status Former   Packs/day: 1.00   Years: 25.00   Total pack years: 25.00   Types: Cigarettes   Quit date: 04/12/1992   Years since quitting: 29.8  Smokeless Tobacco Never   BP Readings from Last 3 Encounters:  01/06/22 120/76  12/22/21 136/80  12/09/21 (!) 158/71   Pulse Readings from Last 3 Encounters:  01/06/22 62  12/22/21 (!) 56  12/09/21 61   Wt Readings from Last 3 Encounters:  02/09/22 210 lb 5.1 oz (95.4 kg)  01/26/22 208 lb 15.9 oz (94.8 kg)  01/12/22 212 lb 4.9 oz (96.3 kg)    Assessment: Review of patient past medical history, allergies, medications, health status, including review of consultants reports, laboratory and other test data, was performed as part of comprehensive evaluation and provision of chronic care management services.   SDOH:  (Social Determinants of Health) assessments and interventions performed:      CCM Care Plan  Allergies  Allergen Reactions   Trulicity [Dulaglutide] Nausea And Vomiting     Medications Reviewed Today     Reviewed by [name], RPH-CPP (Pharmacist) on 02/16/22 at [place] List Status: <None>   Medication Order Taking? Sig Documenting Provider Last Dose Status Informant  aspirin EC 81 MG tablet [id number] No Take 81 mg by mouth daily. [provider] Taking Active Spouse/Significant Other  atorvastatin (LIPITOR) 80 MG tablet [id number] No TAKE ONE TABLET BY MOUTH EVERYDAY AT BEDTIME Colon Branch, MD Taking Active Spouse/Significant Other  Blood Glucose Monitoring Suppl (ONE TOUCH ULTRA 2) w/Device KIT [id number] No by Does not apply route.  Patient not taking: Reported on 01/20/2022   [provider] Not Taking Active Spouse/Significant Other  carvedilol (COREG) 12.5 MG tablet [id number] No TAKE ONE TABLET BY MOUTH EVERY MORNING and TAKE ONE TABLET BY MOUTH EVERYDAY AT BEDTIME  Patient taking differently: Take 12.5 mg by mouth 2 (two) times daily with a meal.   Colon Branch, MD Taking Active Spouse/Significant Other  clopidogrel (PLAVIX) 75 MG tablet [id number] No Take 1 tablet (75 mg total) by mouth daily. Loel Dubonnet, NP Taking Active Spouse/Significant Other  Continuous Blood Gluc Sensor (FREESTYLE LIBRE 2 SENSOR) MISC [id number] No 1 Device by Does not apply route every 14 (fourteen) days. Shamleffer, Melanie Crazier, MD Taking Active Spouse/Significant Other  dapagliflozin propanediol (FARXIGA) 10 MG TABS tablet [id number] No Take 1 tablet (10 mg total) by mouth daily. Loel Dubonnet, NP Taking Active Spouse/Significant Other  FEROSUL 325 (65 Fe) MG tablet [id number] No TAKE 1 TABLET BY MOUTH DAILY Cirigliano, Vito V, DO Taking Active Spouse/Significant Other  FLUoxetine (PROZAC) 40 MG capsule [id number] No Take 1 capsule (40 mg total) by mouth daily. Colon Branch, MD Taking Active Spouse/Significant Other  glucose blood (ONETOUCH ULTRA) test strip [id number] No Check blood sugar 2-3 times daily.  Dx Code: E11.9  Patient not taking:  Reported on 01/20/2022   Colon Branch, MD Not Taking Active Spouse/Significant Other  insulin glargine (LANTUS SOLOSTAR) 100 UNIT/ML Solostar Pen [id number] No Inject 20 Units into the skin daily. Shamleffer, Melanie Crazier, MD Taking Active Spouse/Significant Other  magnesium oxide (MAG-OX) 400 MG tablet [id number] No TAKE ONE TABLET BY MOUTH EVERY MORNING and TAKE ONE TABLET BY MOUTH EVERYDAY AT BEDTIME  Patient taking differently: Take 400 mg by mouth 2 (two) times daily.   Colon Branch, MD Taking Active Spouse/Significant Other  metFORMIN (GLUCOPHAGE) 1000 MG tablet [id number] No Take 1 tablet (1,000 mg total) by mouth 2 (two) times daily with a meal. Shamleffer, Melanie Crazier, MD Taking Active Spouse/Significant Other  Multiple Vitamins-Minerals (MULTIVITAMIN WITH MINERALS) tablet [id number] No Take 1 tablet by mouth daily. [provider] Taking Active Spouse/Significant Other  nitroGLYCERIN (NITROSTAT) 0.4 MG SL tablet [id number] No Place 1 tablet (0.4 mg total) under the tongue every 5 (five) minutes as needed for chest pain. Loel Dubonnet, NP Taking Active Spouse/Significant Other  pantoprazole (PROTONIX) 40 MG tablet [id number] No TAKE ONE TABLET BY MOUTH EVERY MORNING and TAKE ONE TABLET BY MOUTH EVERYDAY AT BEDTIME  Patient taking differently: Take 40 mg by mouth 2 (two) times daily.   Colon Branch, MD Taking Active Spouse/Significant Other  Potassium 99 MG TABS [id number] No Take 99 mg by mouth daily. [provider] Taking Active Spouse/Significant Other  primidone (MYSOLINE) 50 MG tablet [id number] No TAKE THREE TABLETS BY MOUTH EVERY MORNING and TAKE TWO TABLETS BY MOUTH EVERYDAY AT BEDTIME  Patient taking differently: Take 100-150 mg by mouth See admin instructions. TAKE THREE TABLETS BY MOUTH EVERY MORNING and TAKE TWO TABLETS BY MOUTH EVERYDAY AT BEDTIME   Narda Amber K, DO Taking Active Spouse/Significant Other  sacubitril-valsartan (ENTRESTO) 24-26 MG [id number] No  Take 1 tablet by mouth 2 (two) times daily. Loel Dubonnet, NP Taking Active Spouse/Significant Other  spironolactone (ALDACTONE) 25 MG tablet [id number] No Take 0.5 tablets (12.5 mg total) by mouth daily. Loel Dubonnet, NP Taking Active Spouse/Significant Other  vitamin B-12 (CYANOCOBALAMIN) 1000 MCG tablet [id number] No Take 1,000 mcg by mouth daily. 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   Abdominal pain 11/17/2018   Cardiac arrhythmia, unspecified 11/14/2017   Hypomagnesemia 11/14/2017   Anemia 11/14/2017   Bleeding skin mole 11/14/2017   OSA on CPAP 09/21/2017   Obesity 01/23/2016   PCP NOTES >>> 06/08/2015   Anxiety and depression 05/31/2013   Intermediate coronary syndrome ([place]) 12/22/2012   Unstable angina ([place]) 12/16/2012   Fatigue 04/26/2012   Annual physical exam 10/28/2011   Rash 10/28/2011   Coronary Artery Disease 08/28/2011   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   Palpitations 08/28/2011   NSTEMI (non-ST elevated myocardial infarction) ([place]) 08/07/2011   DJD (degenerative joint disease) 05/28/2011   Poorly controlled type 2 diabetes mellitus with circulatory disorder ([place])    Hyperlipidemia    HTN (hypertension)     Immunization History  Administered Date(s) Administered   Fluad Quad(high Dose 65+) 05/04/2019   Influenza Split 06/30/2011   Influenza, High Dose Seasonal PF 05/31/2013, 05/27/2017, 06/07/2018    Influenza, Seasonal, Injecte, Preservative Fre 07/28/2012   Influenza,inj,Quad PF,6+ Mos 05/29/2014, 06/07/2015, 04/23/2016   Influenza-Unspecified 05/24/2020, 06/24/2021   Moderna Covid-19 Vaccine Bivalent Booster 30yr & up 08/29/2021   PFIZER(Purple Top)SARS-COV-2 Vaccination 10/11/2019, 11/01/2019, 05/20/2020   Pneumococcal Conjugate-13 12/05/2014   Pneumococcal Polysaccharide-23 08/07/2011, 10/12/2018   Tdap 05/31/2013   Zoster Recombinat (Shingrix) 04/17/2021, 06/24/2021   Zoster, Live 11/28/2012    Conditions to be addressed/monitored: CAD, HTN, HLD, DMII, Anxiety, Depression and B12 deficiency; iron def anemia; ;essential tremor; peripheral neuropathy; hypomagnesemia; h/o gastric bypass surgery; h/o lower GI bleed and GERD  Care Plan : General Pharmacy (Adult)  Updates made by ECherre Robins RPH-CPP since 02/20/2022 12:00 AM     Problem: Management of Chronic Conditions: Hypertension, Hyperlipidemia, Heart Disease, Diabetes, Depression, GERD/History of GI Bleed, Essential Tremor, Hypomagnesemia, neuropathy   Priority: High  Onset Date: 11/25/2020  Note:   Current Barriers:  Unable to independently afford treatment regimen Unable to achieve control of type 2 DM and HTN   Pharmacist Clinical Goal(s):  Over the next 90 days, patient will verbalize ability to afford treatment regimen achieve adherence to monitoring guidelines and medication adherence to achieve therapeutic efficacy achieve control of diabetes as evidenced by A1c <8.0 and BP <140/90 adhere to prescribed medication regimen as evidenced by refill history  through collaboration with PharmD and provider.   Interventions: 1:1 collaboration with PColon Branch MD regarding development and update of comprehensive plan of care as evidenced by provider attestation and co-signature Inter-disciplinary care team collaboration (see longitudinal plan of care) Comprehensive medication review performed; medication list updated  in electronic medical record  Diabetes: Uncontrolled - last A1c was 8.4% (goal is A1c < 8.0%) Current treatment:  Lantus 20 units daily Metformin 10013mtwice daily Farxiga 1015maily (for blood glucose and heart)  Has tried Trulicity in past - caused nausea (also has h/o gastric bypass surgery and lower GI bleed). Tried jardiance and invokana - Jadiance stopped due to cost and Invokana was non formulary; Novoloin 70/30 - stopped due to hypoglycemia Current glucose readings:  Lowest in last 2 weeks 80 (per patient) Highest 160 At beginning of 2023 Screened for [place] patient assistance program - Household income does not qualify (patient's wife still working) but cost will be $47 per month (at least until reaches coverage gap) and patient is [place] with that cost. (I also had discussed Heart and Diabetes plan with [employer] that covered all diabetes meds at $0 but patient decided not continue with current plan)  Was prescribed DEXCom CGM per Dr Kelton Pillar but patient reports his insuarnce declined because he was not injecting insulin 3 or more times per day. Rechecked 2023 benefits and patient can get Freestyle Libre 2 or 3 at $0 at pharmacy under his Part B benefits. Patient is working with [facility] to get Continuous Glucose Monitor. Reviewed 2023 benefits for [employer] again. Verified they are covering Freestyle Libre sensors for $0.  Diet - reports he has been trying to avoid sweets and high carbohydrate foods like bread and potatoes.  Current exercise: walking some, renovating home, yardwork, working in his shop Interventions:  Reviewed home blood glucose readings and reviewed goals  Fasting blood glucose goal (before meals) = 80 to 130 Blood glucose goal after a meal = less than 180   Discussed how to use treatment indicators / arrows to determine when blood glucose is changing quickly and how to respond.  How to Treat a Low Glucose Level:  If you have a low blood glucose less than 70,  please eat / drink 15 grams of carbohydrates (4 oz of juice, soda, 4 glucose tablets, or 3-4 pieces of hard candy).  It is best so choose a "quick" source of sugar that does no contain fat (chocolate and peanut butter might take longer to increase your blood glucose) Wait 15 minutes and then recheck your blood glucose. If your blood glucose is still less than 70, eat another 15 grams of carbohydrates.  Wait another 15 minutes and recheck your glucose.  Continue this until your blood glucose is over 70. Once you blood glucose is over 70, eat a snack with protein in it to prevent your blood glucose from dropping again.   Hypertension / CHF: Uncontrolled but improving. BP Readings from Last 3 Encounters:  01/06/22 120/76  12/22/21 136/80  12/09/21 (!) 158/71  Current treatment: Carvedilol 12.17m twice daily Entresto 24/275mtwice a day Spironolactone 2531m take 0.5 tablet = 12.5mg4mily Farxiga 10mg78mly (for blood glucose and heart)  Current home readings: not provided today Previous medications tried: losartan - therapy changed to Entresto Applying for Entreto medication assistance program  Interventions:  Counseled on checking blood pressure  at home to get better idea of BP outside of office.  Recommended check weight daily. Contact office if weight increase by more than 3lbs in 24 hours of 5 lbs in 1 week.   Hyperlipidemia / CAD: Controlled;  Current treatment:  Aspirin 81mg 66my Clopidogrel 75mg d72m (to continue for at least 1 year after recent stent 12/07/2021)  Atorvastatin 80mg da24m Nitroglycerin 0.4mg as n72med Interventions:  Recommended continue current therapy Discussed adherence to maintenance medications - adherence has improved since patient started monthly packaging.   Depression/Anxiety/: Uncontrolled but improved with change from citalopram to fluoxetine 04/17/2021 Current treatment: Fluoxetine 40mg - ta4m capsule daily      01/06/2022    8:35 AM  11/27/2021    8:47 AM 10/21/2021   11:02 AM  PHQ9 SCORE ONLY  PHQ-9 Total Score 3 0 6  Patient reports  he has more energy and has more interests in hobbies and getting out since switch from citalopram to fluoxetine.  Interventions  Recommended continue current therapy  Neuropathy Controlled  Lab Results  Component Value Date   [id number] 160 10/21/2021  Current regimen:  Over-the-counter vitamin B12 1034mg daily  Interventions:  Continue physical therapy / cardio rehab.    Hypomagnesemia Current regimen:  Magnesium oxide 4083mtwice daily Interventions: Continue to monitor magnesium level every 6 to 12 months.   Medication management Current [facility]: [facility] Interventions Comprehensive medication review performed. Utilize  pharmacy for medication synchronization, packaging and delivery Reviewed refill history and assesses adherence. Discussed adherence with patient and his wife.  Provided application for Entresto patient assistance program at last visit. Has been returned and will fax today.   Patient Goals/Self-Care Activities Over the next 90 days, patient will:  take medications as prescribed Check weight daily. Contact office if weight increase by more than 3lbs in 24 hours of 5 lbs in 1 week; or if you experience  signs and symptoms of heart failure - weight gain, shortness of breath, abdominal fullness, swelling in legs or abdomen, Fatigue and weakness, changes in ability to perform usual activities, persistent cough or wheezing with white or pink blood-tinged mucus, nausea and lack of appetite check glucose with Continuous Glucose Monitor. Contact office if any blood glucose reading < 80 or > 200. check blood pressure 1 to 2 times per week, document, and provide at future appointments engage in dietary modifications by decreasing intake of high sugar / CHO snacks and limiting serving sizes  Follow Up Plan: Telephone follow up appointment with care  management team member scheduled for:  1 to 2 months         Medication Assistance:  FaWilder Gladebtained through [facility] [employer] [place] medication assistance program.  Enrollment ends 08/23/2022; We are applying [facility] program.    Patient's preferred pharmacy is:  [facility] [place] [address] [place]one: [phone]: [id number]: [phone]: [phone] Uses pill box? No - using packaging from [facility] Adherence is about 98% - working with patient and his wife to increase adherence.    Follow Up:  Patient agrees to Care Plan and Follow-up.  Plan: Telephone follow up appointment with care management team member scheduled for:  1 to 2 months  TaCherre RobinsPharmD Clinical Pharmacist [phone]

## 2022-02-23 ENCOUNTER — Encounter (HOSPITAL_COMMUNITY)
Admission: RE | Admit: 2022-02-23 | Discharge: 2022-02-23 | Disposition: A | Discharge status: Home / Self Care | Payer: PPO | Source: Ambulatory Visit | Attending: Cardiology | Admitting: Cardiology

## 2022-02-23 VITALS — Wt 207.7 lb

## 2022-02-23 DIAGNOSIS — Z955 Presence of coronary angioplasty implant and graft: Secondary | ICD-10-CM | POA: Insufficient documentation

## 2022-02-23 DIAGNOSIS — I214 Non-ST elevation (NSTEMI) myocardial infarction: Secondary | ICD-10-CM | POA: Diagnosis not present

## 2022-02-23 DIAGNOSIS — E1165 Type 2 diabetes mellitus with hyperglycemia: Secondary | ICD-10-CM | POA: Diagnosis not present

## 2022-02-23 NOTE — Progress Notes (Signed)
Daily Session Note  Patient Details  Name: Randy Castro. MRN: [id number] Date of Birth: 12-Dec-1950 Referring Provider:   Flowsheet Row CARDIAC REHAB PHASE II ORIENTATION from 01/06/2022 in [place]  Referring Provider Dr. Louanne Belton       Encounter Date: 02/23/2022  Check In:  Session Check In - 02/23/22 0930       Check-In   Supervising physician immediately available to respond to emergencies [facility] MD immediately available    Physician(s) Dr. Johnsie Cancel    Location [facility]    Staff Present Redge Gainer, BS, Exercise Physiologist;Dalton Kris Mouton, MS, ACSM-CEP, Exercise Physiologist    Virtual Visit No    Medication changes reported     No    Fall or balance concerns reported    No    Tobacco Cessation No Change    Warm-up and Cool-down Performed as group-led instruction    Resistance Training Performed Yes    VAD Patient? No    PAD/SET Patient? No      Pain Assessment   Currently in Pain? No/denies    Pain Score 0-No pain    Multiple Pain Sites No             Capillary Blood Glucose: No results found for this or any previous visit (from the past 24 hour(s)).    Social History   Tobacco Use  Smoking Status Former   Packs/day: 1.00   Years: 25.00   Total pack years: 25.00   Types: Cigarettes   Quit date: 04/12/1992   Years since quitting: 29.8  Smokeless Tobacco Never    Goals Met:  Independence with exercise equipment Exercise tolerated well No report of concerns or symptoms today Strength training completed today  Goals Unmet:  Not Applicable  Comments: check out 1030   Dr. Carlyle Dolly is Medical Director for [place]

## 2022-02-25 ENCOUNTER — Encounter (HOSPITAL_COMMUNITY)
Admission: RE | Admit: 2022-02-25 | Discharge: 2022-02-25 | Disposition: A | Discharge status: Home / Self Care | Payer: PPO | Source: Ambulatory Visit | Attending: Cardiology | Admitting: Cardiology

## 2022-02-25 DIAGNOSIS — Z955 Presence of coronary angioplasty implant and graft: Secondary | ICD-10-CM | POA: Diagnosis not present

## 2022-02-25 DIAGNOSIS — I214 Non-ST elevation (NSTEMI) myocardial infarction: Secondary | ICD-10-CM

## 2022-02-25 NOTE — Progress Notes (Signed)
Daily Session Note  Patient Details  Name: Randy Castro. MRN: [id number] Date of Birth: [birth year] Referring Provider:   Flowsheet Row CARDIAC REHAB PHASE II ORIENTATION from 01/06/2022 in [place]  Referring Provider Dr. Louanne Belton       Encounter Date: 02/25/2022  Check In:  Session Check In - 02/25/22 0927       Check-In   Supervising physician immediately available to respond to emergencies [facility] MD immediately available    Physician(s) Dr. Johnsie Cancel    Staff Present Redge Gainer, BS, Exercise Physiologist;Zachary Nole Wynetta Emery, RN, Bjorn Loser, MS, ACSM-CEP, Exercise Physiologist    Virtual Visit No    Medication changes reported     No    Fall or balance concerns reported    Yes    Comments He has fallen twice due to dizziness and vertigo. He also has diabetic neuropathy which alters his balance as well.    Tobacco Cessation No Change    Resistance Training Performed Yes    VAD Patient? No    PAD/SET Patient? No      Pain Assessment   Currently in Pain? No/denies    Pain Score 0-No pain    Multiple Pain Sites No             Capillary Blood Glucose: No results found for this or any previous visit (from the past 24 hour(s)).    Social History   Tobacco Use  Smoking Status Former   Packs/day: 1.00   Years: 25.00   Total pack years: 25.00   Types: Cigarettes   Quit date: 04/12/1992   Years since quitting: 29.8  Smokeless Tobacco Never    Goals Met:  Independence with exercise equipment Exercise tolerated well No report of concerns or symptoms today Strength training completed today  Goals Unmet:  Not Applicable  Comments: Check out 1030   Dr. Carlyle Dolly is Medical Director for [place]

## 2022-02-27 ENCOUNTER — Encounter (HOSPITAL_COMMUNITY)
Admission: RE | Admit: 2022-02-27 | Discharge: 2022-02-27 | Disposition: A | Discharge status: Home / Self Care | Payer: PPO | Source: Ambulatory Visit | Attending: Cardiology | Admitting: Cardiology

## 2022-02-27 DIAGNOSIS — Z955 Presence of coronary angioplasty implant and graft: Secondary | ICD-10-CM

## 2022-02-27 DIAGNOSIS — I214 Non-ST elevation (NSTEMI) myocardial infarction: Secondary | ICD-10-CM

## 2022-02-27 NOTE — Progress Notes (Signed)
Daily Session Note  Patient Details  Name: Randy Castro. MRN: [id number] Date of Birth: 09/01/1950 Referring Provider:   Flowsheet Row CARDIAC REHAB PHASE II ORIENTATION from 01/06/2022 in [place]  Referring Provider Dr. Louanne Belton       Encounter Date: 02/27/2022  Check In:  Session Check In - 02/27/22 0927       Check-In   Supervising physician immediately available to respond to emergencies [facility] MD immediately available    Physician(s) Dr. Marlou Porch    Location [facility]    Staff Present Aundra Dubin, RN, BSN;Heather Otho Ket, BS, Exercise Physiologist;Dalton Kris Mouton, MS, ACSM-CEP, Exercise Physiologist    Virtual Visit No    Medication changes reported     No    Fall or balance concerns reported    Yes    Comments He has fallen twice due to dizziness and vertigo. He also has diabetic neuropathy which alters his balance as well.    Tobacco Cessation No Change    Warm-up and Cool-down Performed as group-led instruction    Resistance Training Performed Yes    VAD Patient? No    PAD/SET Patient? No      Pain Assessment   Currently in Pain? No/denies    Pain Score 0-No pain    Multiple Pain Sites No             Capillary Blood Glucose: No results found for this or any previous visit (from the past 24 hour(s)).    Social History   Tobacco Use  Smoking Status Former   Packs/day: 1.00   Years: 25.00   Total pack years: 25.00   Types: Cigarettes   Quit date: 04/12/1992   Years since quitting: 29.8  Smokeless Tobacco Never    Goals Met:  Independence with exercise equipment Exercise tolerated well No report of concerns or symptoms today Strength training completed today  Goals Unmet:  Not Applicable  Comments: Check out 1030.   Dr. Carlyle Dolly is Medical Director for [facility] Cardiac [facility]

## 2022-03-02 ENCOUNTER — Encounter (HOSPITAL_COMMUNITY): Payer: PPO

## 2022-03-03 ENCOUNTER — Ambulatory Visit (INDEPENDENT_AMBULATORY_CARE_PROVIDER_SITE_OTHER): Payer: PPO | Admitting: Pharmacist

## 2022-03-03 DIAGNOSIS — I5022 Chronic systolic (congestive) heart failure: Secondary | ICD-10-CM

## 2022-03-03 DIAGNOSIS — E1165 Type 2 diabetes mellitus with hyperglycemia: Secondary | ICD-10-CM

## 2022-03-03 NOTE — Chronic Care Management (AMB) (Signed)
Chronic Care Management Pharmacy Note  03/02/2022 Name:  Randy Castro. MRN:  [id number] DOB:  1951/04/28  Summary:  Patient's wife states that they received letter from [employer] patient assistance program stating the patient's application was denied due to patient having employer coverage. Patient is not employed and only coverage is HTA. Patient's wife is still working but to add him to her employer coverage would cost $600 per month - more that cost of Entresto. Called [employer] patient assistance program to Geographical information systems officer for [employer]. Informed that was denied due to income.  The first [employer] patient assistance program rep has stated income limit was $118,000 for household of 2, however the second representative states that income limit is $78,800 therefore patient does not qualify for [facility] patient assistance program.  Applied for [employer] for cardiomegaly. Approved thru 02/2023. Wife states that they did receive information about [facility]. She did not have any questions but not sure that she will use the Link Up option.   Subjective: Randy Castro. is an 71 y.o. year old male who is a primary patient of Paz, Alda Berthold, MD.  The CCM team was consulted for assistance with disease management and care coordination needs.    Engaged with patient and his wife by telephone for  follow up and Continuous Glucose Monitor education  in response to provider referral for pharmacy case management and/or care coordination services.   Consent to Services:  The patient was given information about Chronic Care Management services, agreed to services, and gave verbal consent prior to initiation of services.  Please see initial visit note for detailed documentation.   Patient Care Team: Colon Branch, MD as PCP - General (Internal Medicine) Satira Sark, MD as PCP - Cardiology (Cardiology) Larey Dresser, MD as Consulting Physician (Cardiology) Lake Bells., MD as  Referring Physician (Gastroenterology) Gearlean Alf, PA-C as Physician Assistant (Pulmonary Disease) Demetrius Revel, MD as Consulting Physician (Bariatrics) [name], [name], DO as Consulting Physician (Neurology) [name], [place] (Pharmacist) Madelin Headings, DO (Optometry)  Recent office visits: 11/27/2021 - Int Med (Dr Larose Kells) annual physical exam. Labs checked. No med changes noted.  10/21/2021 - Int Med (Dr Larose Kells) Seed for acute visit - dizziness. Ordered MRI. MRI showed No acute intracranial process. No etiology is seen for the patient's dizziness. Referred to physical therapy for benign paroxysmal vertigo. 07/23/2021 - Int Med (Dr Larose Kells) F/U anxiety and depression. Improved with fluoxetine 33m daily. BP was at goal. F/U 3 to 4 months  Recent consult visits: 12/22/2021 - Cardio (Gilford Rile NP) F/U hospitalization. Increased farxiga to 153mdaily. Labs checked. After normal CMP- spironolactone 12.2m12maily started.  11/20/2021 - Cardio (Dr McDDomenic Polite/U CAD. No med changes noted.  11/18/2021 - Endo (Dr ShaKelton Pillar/U diabetes. A1c down form 9.2% to 7.8%. Changed from 70/30 mix insulin to Lantus 20 units daily. Continued metformin and Farxiga at current dosese. Plan to get [place] F/U 6 months. 07/15/2021 - Endo (Dr ShaKelton Pillar/U diabetes. A1c was 9.1. Started Farxiga 2mg17mily before breakfast. BP also noted to be elevated.   [facility]: [facility] 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  New medications stated at discharge: clopidogrel 72mg53mly for 1 year. Entresto 24/26mg 93me a day Medications stopped at discharge: losartan 50mg  66mctive:  Lab Results  Component Value Date   CREATININE 0.84 01/20/2022   CREATININE  0.67 (L) 12/22/2021   CREATININE 0.72 12/09/2021    Lab Results  Component Value Date   HGBA1C 7.8 (A) 11/18/2021   Last diabetic Eye exam:  Lab Results   Component Value Date/Time   HMDIABEYEEXA No Retinopathy 06/18/2021 12:00 AM    Last diabetic Foot exam:  Lab Results  Component Value Date/Time   HMDIABFOOTEX Done 03/19/2019 12:00 AM        Component Value Date/Time   CHOL 110 11/27/2021 0930   TRIG 121.0 11/27/2021 0930   HDL 45.40 11/27/2021 0930   CHOLHDL 2 11/27/2021 0930   VLDL 24.2 11/27/2021 0930   LDLCALC 40 11/27/2021 0930   LDLDIRECT 145.0 04/26/2018 1026       Latest Ref Rng & Units 12/07/2021   12:40 AM 12/06/2021    5:26 AM 07/23/2021    9:20 AM  Hepatic Function  Total Protein 6.5 - 8.1 g/dL 5.3  6.3  5.9   Albumin 3.5 - 5.0 g/dL 3.0  3.6  3.9   AST 15 - 41 U/L 30  43  26   ALT 0 - 44 U/L 32  42  37   Alk Phosphatase 38 - 126 U/L 53  61  60   Total Bilirubin 0.3 - 1.2 mg/dL 0.6  0.4  0.5   Bilirubin, Direct 0.0 - 0.3 mg/dL   0.1     Lab Results  Component Value Date/Time   TSH 1.11 04/17/2021 09:19 AM   TSH 0.92 04/26/2018 10:26 AM       Latest Ref Rng & Units 12/22/2021   11:48 AM 12/09/2021    4:13 AM 12/08/2021    4:12 AM  CBC  WBC 3.4 - 10.8 x10E3/uL 7.9  6.1  8.0   Hemoglobin 13.0 - 17.7 g/dL 13.6  11.5  11.4   Hematocrit 37.5 - 51.0 % 41.7  34.3  34.6   Platelets 150 - 450 x10E3/uL 293  207  236     No results found for: "VD25OH"  Clinical ASCVD: Yes  The ASCVD Risk score (Arnett DK, et al., 2019) failed to calculate for the following reasons:   The patient has a prior MI or stroke diagnosis     Social History   Tobacco Use  Smoking Status Former   Packs/day: 1.00   Years: 25.00   Total pack years: 25.00   Types: Cigarettes   Quit date: 04/12/1992   Years since quitting: 29.9  Smokeless Tobacco Never   BP Readings from Last 3 Encounters:  01/06/22 120/76  12/22/21 136/80  12/09/21 (!) 158/71   Pulse Readings from Last 3 Encounters:  01/06/22 62  12/22/21 (!) 56  12/09/21 61   Wt Readings from Last 3 Encounters:  02/23/22 207 lb 10.8 oz (94.2 kg)  02/09/22 210 lb 5.1  oz (95.4 kg)  01/26/22 208 lb 15.9 oz (94.8 kg)    Assessment: Review of patient past medical history, allergies, medications, health status, including review of consultants reports, laboratory and other test data, was performed as part of comprehensive evaluation and provision of chronic care management services.   SDOH:  (Social Determinants of Health) assessments and interventions performed:      CCM Care Plan  Allergies  Allergen Reactions   Trulicity [Dulaglutide] Nausea And Vomiting    Medications Reviewed Today     Reviewed by [name], RPH-CPP (Pharmacist) on 03/03/22 at [phone]  Med List Status: <None>   Medication Order Taking? Sig Documenting Provider Last Dose Status Informant  aspirin EC 81 MG tablet [id number] No Take 81 mg by mouth daily. [provider] Taking Active Spouse/Significant Other  atorvastatin (LIPITOR) 80 MG tablet [id number] No TAKE ONE TABLET BY MOUTH EVERYDAY AT BEDTIME Colon Branch, MD Taking Active Spouse/Significant Other  Blood Glucose Monitoring Suppl (ONE TOUCH ULTRA 2) w/Device KIT [id number] No by Does not apply route.  Patient not taking: Reported on 01/20/2022   [provider] Not Taking Active Spouse/Significant Other  carvedilol (COREG) 12.5 MG tablet [id number] No TAKE ONE TABLET BY MOUTH EVERY MORNING and TAKE ONE TABLET BY MOUTH EVERYDAY AT BEDTIME  Patient taking differently: Take 12.5 mg by mouth 2 (two) times daily with a meal.   Colon Branch, MD Taking Active Spouse/Significant Other  clopidogrel (PLAVIX) 75 MG tablet [id number] No Take 1 tablet (75 mg total) by mouth daily. Loel Dubonnet, NP Taking Active Spouse/Significant Other  Continuous Blood Gluc Sensor (FREESTYLE LIBRE 2 SENSOR) MISC [id number] No 1 Device by Does not apply route every 14 (fourteen) days. Shamleffer, Melanie Crazier, MD Taking Active Spouse/Significant Other  dapagliflozin propanediol (FARXIGA) 10 MG TABS tablet [id number] No Take 1 tablet (10  mg total) by mouth daily. Loel Dubonnet, NP Taking Active Spouse/Significant Other  FEROSUL 325 (65 Fe) MG tablet [id number] No TAKE 1 TABLET BY MOUTH DAILY Cirigliano, Vito V, DO Taking Active Spouse/Significant Other  FLUoxetine (PROZAC) 40 MG capsule [id number] No Take 1 capsule (40 mg total) by mouth daily. Colon Branch, MD Taking Active Spouse/Significant Other  glucose blood (ONETOUCH ULTRA) test strip [id number] No Check blood sugar 2-3 times daily.  Dx Code: E11.9  Patient not taking: Reported on 01/20/2022   Colon Branch, MD Not Taking Active Spouse/Significant Other  insulin glargine (LANTUS SOLOSTAR) 100 UNIT/ML Solostar Pen [id number] No Inject 20 Units into the skin daily. Shamleffer, Melanie Crazier, MD Taking Active Spouse/Significant Other  magnesium oxide (MAG-OX) 400 MG tablet [id number] No TAKE ONE TABLET BY MOUTH EVERY MORNING and TAKE ONE TABLET BY MOUTH EVERYDAY AT BEDTIME  Patient taking differently: Take 400 mg by mouth 2 (two) times daily.   Colon Branch, MD Taking Active Spouse/Significant Other  metFORMIN (GLUCOPHAGE) 1000 MG tablet [id number] No Take 1 tablet (1,000 mg total) by mouth 2 (two) times daily with a meal. Shamleffer, Melanie Crazier, MD Taking Active Spouse/Significant Other  Multiple Vitamins-Minerals (MULTIVITAMIN WITH MINERALS) tablet [id number] No Take 1 tablet by mouth daily. [provider] Taking Active Spouse/Significant Other  nitroGLYCERIN (NITROSTAT) 0.4 MG SL tablet [id number] No Place 1 tablet (0.4 mg total) under the tongue every 5 (five) minutes as needed for chest pain. Loel Dubonnet, NP Taking Active Spouse/Significant Other  pantoprazole (PROTONIX) 40 MG tablet [id number] No TAKE ONE TABLET BY MOUTH EVERY MORNING and TAKE ONE TABLET BY MOUTH EVERYDAY AT BEDTIME  Patient taking differently: Take 40 mg by mouth 2 (two) times daily.   Colon Branch, MD Taking Active Spouse/Significant Other  Potassium 99 MG TABS [id number] No Take 99 mg by mouth  daily. [provider] Taking Active Spouse/Significant Other  primidone (MYSOLINE) 50 MG tablet [id number] No TAKE THREE TABLETS BY MOUTH EVERY MORNING and TAKE TWO TABLETS BY MOUTH EVERYDAY AT BEDTIME  Patient taking differently: Take 100-150 mg by mouth See admin instructions. TAKE THREE TABLETS BY MOUTH EVERY MORNING and TAKE TWO TABLETS BY MOUTH EVERYDAY AT BEDTIME   Alda Berthold, DO Taking Active Spouse/Significant Other  sacubitril-valsartan (ENTRESTO) 24-26 MG [id number]  Take 1 tablet  by mouth 2 (two) times daily. Colon Branch, MD  Active   spironolactone (ALDACTONE) 25 MG tablet [id number] No Take 0.5 tablets (12.5 mg total) by mouth daily. Loel Dubonnet, NP Taking Active Spouse/Significant Other  vitamin B-12 (CYANOCOBALAMIN) 1000 MCG tablet [id number] No Take 1,000 mcg by mouth daily. 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   Abdominal pain 11/17/2018   Cardiac arrhythmia, unspecified 11/14/2017   Hypomagnesemia 11/14/2017   Anemia 11/14/2017   Bleeding skin mole 11/14/2017   OSA on CPAP 09/21/2017   Obesity 01/23/2016   PCP NOTES >>> 06/08/2015   Anxiety and depression 05/31/2013   Intermediate coronary syndrome ([place]) 12/22/2012   Unstable angina ([place]) 12/16/2012   Fatigue 04/26/2012   Annual physical exam 10/28/2011   Rash 10/28/2011   Coronary Artery Disease 08/28/2011   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    Palpitations 08/28/2011   NSTEMI (non-ST elevated myocardial infarction) ([place]) 08/07/2011   DJD (degenerative joint disease) 05/28/2011   Poorly controlled type 2 diabetes mellitus with circulatory disorder ([place])    Hyperlipidemia    HTN (hypertension)     Immunization History  Administered Date(s) Administered   Fluad Quad(high Dose 65+) 05/04/2019   Influenza Split 06/30/2011   Influenza, High Dose Seasonal PF 05/31/2013, 05/27/2017, 06/07/2018   Influenza, Seasonal, Injecte, Preservative Fre 07/28/2012   Influenza,inj,Quad PF,6+ Mos 05/29/2014, 06/07/2015, 04/23/2016   Influenza-Unspecified 05/24/2020, 06/24/2021   Moderna Covid-19 Vaccine Bivalent Booster 27yrs & up 08/29/2021   PFIZER(Purple Top)SARS-COV-2 Vaccination 10/11/2019, 11/01/2019, 05/20/2020   Pneumococcal Conjugate-13 12/05/2014   Pneumococcal Polysaccharide-23 08/07/2011, 10/12/2018   Tdap 05/31/2013   Zoster Recombinat (Shingrix) 04/17/2021, 06/24/2021   Zoster, Live 11/28/2012    Conditions to be addressed/monitored: CAD, HTN, HLD, DMII, Anxiety, Depression and B12 deficiency; iron def anemia; ;essential tremor; peripheral neuropathy; hypomagnesemia; h/o gastric bypass surgery; h/o lower GI bleed and GERD  Care Plan : General Pharmacy (Adult)  Updates made by [name], RPH-CPP since 03/03/2022 12:00 AM     Problem: Management of Chronic Conditions: Hypertension, Hyperlipidemia, Heart Disease, Diabetes, Depression, GERD/History of GI Bleed, Essential Tremor, Hypomagnesemia, neuropathy   Priority: High  Onset Date: 11/25/2020  Note:   Current Barriers:  Unable to independently afford treatment regimen Unable to achieve control of type 2 DM and HTN   Pharmacist Clinical Goal(s):  Over the next 90 days, patient will verbalize ability to afford treatment regimen achieve adherence to monitoring guidelines and medication adherence to achieve therapeutic efficacy achieve control of diabetes as evidenced by A1c  <8.0 and BP <140/90 adhere to prescribed medication regimen as evidenced by refill history  through collaboration with PharmD and provider.   Interventions: 1:1 collaboration with Colon Branch, MD regarding development and update of comprehensive plan of care as evidenced by provider attestation and co-signature Inter-disciplinary care team collaboration (see longitudinal plan of care) Comprehensive medication review performed; medication list updated in electronic medical record  Diabetes: Controlled - A1c  goal is < 8.0% Current treatment:  Lantus 20 units daily Metformin 1044m twice daily Farxiga 158mdaily (for blood glucose and heart)  Has tried Trulicity in past - caused nausea (also has h/o gastric bypass surgery and lower GI bleed). Tried jardiance and invokana - Jadiance stopped due to cost and Invokana was non formulary; Novoloin 70/30 - stopped due to hypoglycemia Current glucose readings: Lowest in last 2 weeks 80 (per patient) Highest 160 At beginning of 2023 Screened for Fa[employer]atient assistance program - Household income does not qualify (patient's wife still working) but cost will be $47 per month (at least until reaches coverage gap) and patient is Ok[employer] that cost. (I also had discussed Heart and Diabetes plan with [employer] that covered all diabetes meds at $0 but patient decided not continue with current plan)  Continue to use Freestyle Libre 2 -  sensors $0 with [employer] plan for 2023. Diet - reports he has been trying to avoid sweets and high carbohydrate foods like bread and potatoes.  Current exercise: walking some, renovating home, yardwork, working in his shop Interventions:  Reviewed home blood glucose readings and reviewed goals  Fasting blood glucose goal (before meals) = 80 to 130 Blood glucose goal after a meal = less than 180   Discussed how to use treatment indicators / arrows to determine when blood glucose is changing quickly and how to respond.  How to Treat a  Low Glucose Level:  If you have a low blood glucose less than 70, please eat / drink 15 grams of carbohydrates (4 oz of juice, soda, 4 glucose tablets, or 3-4 pieces of hard candy).  It is best so choose a "quick" source of sugar that does no contain fat (chocolate and peanut butter might take longer to increase your blood glucose) Wait 15 minutes and then recheck your blood glucose. If your blood glucose is still less than 70, eat another 15 grams of carbohydrates.  Wait another 15 minutes and recheck your glucose.  Continue this until your blood glucose is over 70. Once you blood glucose is over 70, eat a snack with protein in it to prevent your blood glucose from dropping again.   Hypertension / CHF: Uncontrolled but improving. BP Readings from Last 3 Encounters:  01/06/22 120/76  12/22/21 136/80  12/09/21 (!) 158/71  Current treatment: Carvedilol 12.33m133mwice daily Entresto 24/31m50mice a day Spironolactone 233mg6make 0.5 tablet = 12.33mg d31my Farxiga 10mg d93m (for blood glucose and heart)  Current home readings: not provided today Previous medications tried: losartan - therapy changed to [employer] assistance program - initially denied Interventions:  Counseled on checking blood pressure  at home to get better idea of BP outside of office.  Recommended check weight daily. Contact office if weight increase by more than 3lbs in 24 hours of 5 lbs in 1 week.  Will try to appeal [facility] assistance program decision.  Hyperlipidemia / CAD: Controlled;  Current treatment:  Aspirin 81mg da51mClopidogrel 733mg dai76mto continue for at least 1 year after recent stent 12/07/2021)  Atorvastatin 80mg dail81mitroglycerin 0.4mg as nee89m Interventions:  Recommended continue current therapy Discussed adherence to maintenance medications - adherence has improved since patient started monthly packaging.   Depression/Anxiety/: Uncontrolled but improved  with change from citalopram to fluoxetine 04/17/2021 Current treatment: Fluoxetine 40mg - take62mapsule daily      01/06/2022    8:35 AM 11/27/2021    8:47 AM  10/21/2021   11:02 AM  PHQ9 SCORE ONLY  PHQ-9 Total Score 3 0 6  Patient reports he has more energy and has more interests in hobbies and getting out since switch from citalopram to fluoxetine.  Interventions  Recommended continue current therapy  Neuropathy Controlled  Lab Results  Component Value Date   [id number] 867 10/21/2021  Current regimen:  Over-the-counter vitamin B12 1036mg daily  Interventions:  Continue physical therapy / cardio rehab.    Hypomagnesemia Current regimen:  Magnesium oxide 4088mtwice daily Interventions: Continue to monitor magnesium level every 6 to 12 months.   Medication management Current pharmacy: [facility] Interventions Comprehensive medication review performed. Utilize  pharmacy for medication synchronization, packaging and delivery Reviewed refill history and assesses adherence. Discussed adherence with patient and his wife.  Provided application for [employer] patient assistance program at last visit. Has been returned and will fax today.   Patient Goals/Self-Care Activities Over the next 90 days, patient will:  take medications as prescribed Check weight daily. Contact office if weight increase by more than 3lbs in 24 hours of 5 lbs in 1 week; or if you experience  signs and symptoms of heart failure - weight gain, shortness of breath, abdominal fullness, swelling in legs or abdomen, Fatigue and weakness, changes in ability to perform usual activities, persistent cough or wheezing with white or pink blood-tinged mucus, nausea and lack of appetite check glucose with Continuous Glucose Monitor. Contact office if any blood glucose reading < 80 or > 200. check blood pressure 1 to 2 times per week, document, and provide at future appointments engage in dietary  modifications by decreasing intake of high sugar / CHO snacks and limiting serving sizes  Follow Up Plan: Telephone follow up appointment with care management team member scheduled for:  1 to 2 months          Medication Assistance:  FaWilder Gladebtained [facility] program.  Enrollment ends 08/23/2022; Applied for EnPraxairatient assistance program but patient did not quCivil Service fast streameror He[employer]ée Lauderor cardiomegaly. Approved thru 02/2023.  Patient's preferred pharmacy is:  [facility] [place] [address] [place]: [phone]: [id number]: [phone]: [phone] Uses pill box? No - using packaging from [facility] Adherence is about 98% - working with patient and his wife to increase adherence.    Follow Up:  Patient agrees to Care Plan and Follow-up.  Plan: Telephone follow up appointment with care management team member scheduled for:  1 to 2 months  TaCherre RobinsPharmD Clinical Pharmacist [phone]

## 2022-03-04 ENCOUNTER — Encounter (HOSPITAL_COMMUNITY)
Admission: RE | Admit: 2022-03-04 | Discharge: 2022-03-04 | Disposition: A | Discharge status: Home / Self Care | Payer: PPO | Source: Ambulatory Visit | Attending: Cardiology | Admitting: Cardiology

## 2022-03-04 DIAGNOSIS — Z955 Presence of coronary angioplasty implant and graft: Secondary | ICD-10-CM

## 2022-03-04 DIAGNOSIS — I214 Non-ST elevation (NSTEMI) myocardial infarction: Secondary | ICD-10-CM

## 2022-03-04 NOTE — Progress Notes (Signed)
Daily Session Note  Patient Details  Name: Randy Castro. MRN: [id number] Date of Birth: 04/19/1951 Referring Provider:   Flowsheet Row CARDIAC REHAB PHASE II ORIENTATION from 01/06/2022 in [place]  Referring Provider Dr. Louanne Belton       Encounter Date: 03/04/2022  Check In:  Session Check In - 03/04/22 0930       Check-In   Supervising physician immediately available to respond to emergencies [facility] MD immediately available    Physician(s) Dr Audie Box    Location [facility]    Staff Present Hoy Register, MS, ACSM-CEP, Exercise Physiologist;Heather Otho Ket, BS, Exercise Physiologist;Javeion Cannedy Hassell Done, RN, BSN    Virtual Visit No    Medication changes reported     No    Fall or balance concerns reported    Yes    Comments He has fallen twice due to dizziness and vertigo. He also has diabetic neuropathy which alters his balance as well.    Tobacco Cessation No Change    Warm-up and Cool-down Performed as group-led instruction    Resistance Training Performed Yes    VAD Patient? No    PAD/SET Patient? No      Pain Assessment   Currently in Pain? No/denies    Pain Score 0-No pain    Multiple Pain Sites No             Capillary Blood Glucose: No results found for this or any previous visit (from the past 24 hour(s)).    Social History   Tobacco Use  Smoking Status Former   Packs/day: 1.00   Years: 25.00   Total pack years: 25.00   Types: Cigarettes   Quit date: 04/12/1992   Years since quitting: 29.9  Smokeless Tobacco Never    Goals Met:  Independence with exercise equipment Exercise tolerated well No report of concerns or symptoms today Strength training completed today  Goals Unmet:  Not Applicable  Comments: Checkout at 1030.   Dr. Carlyle Dolly is Medical Director for [facility]

## 2022-03-05 ENCOUNTER — Other Ambulatory Visit: Payer: Self-pay | Admitting: Internal Medicine

## 2022-03-06 ENCOUNTER — Encounter (HOSPITAL_COMMUNITY)
Admission: RE | Admit: 2022-03-06 | Discharge: 2022-03-06 | Disposition: A | Discharge status: Home / Self Care | Payer: PPO | Source: Ambulatory Visit | Attending: Cardiology | Admitting: Cardiology

## 2022-03-06 DIAGNOSIS — Z955 Presence of coronary angioplasty implant and graft: Secondary | ICD-10-CM

## 2022-03-06 DIAGNOSIS — I214 Non-ST elevation (NSTEMI) myocardial infarction: Secondary | ICD-10-CM

## 2022-03-06 NOTE — Progress Notes (Signed)
Daily Session Note  Patient Details  Name: Randy S Blucher Jr. MRN: [id number] Date of Birth: 09/18/1950 Referring Provider:   Flowsheet Row CARDIAC REHAB PHASE II ORIENTATION from 01/06/2022 in [place] [facility]  Referring Provider Dr. Pokhrel       Encounter Date: 03/06/2022  Check In:  Session Check In - 03/06/22 0930       Check-In   Supervising physician immediately available to respond to emergencies [facility] MD immediately available    Physician(s) Dr. Turner    Location [facility]    Staff Present Heather Jachimiak, BS, Exercise Physiologist;Debra Johnson, RN, BSN;Dalton Fletcher, MS, ACSM-CEP, Exercise Physiologist    Virtual Visit No    Medication changes reported     No    Fall or balance concerns reported    Yes    Comments He has fallen twice due to dizziness and vertigo. He also has diabetic neuropathy which alters his balance as well.    Tobacco Cessation No Change    Warm-up and Cool-down Performed as group-led instruction    Resistance Training Performed Yes    VAD Patient? No    PAD/SET Patient? No      Pain Assessment   Currently in Pain? No/denies    Pain Score 0-No pain    Multiple Pain Sites No             Capillary Blood Glucose: No results found for this or any previous visit (from the past 24 hour(s)).    Social History   Tobacco Use  Smoking Status Former   Packs/day: 1.00   Years: 25.00   Total pack years: 25.00   Types: Cigarettes   Quit date: 04/12/1992   Years since quitting: 29.9  Smokeless Tobacco Never    Goals Met:  Independence with exercise equipment Personal goals reviewed No report of concerns or symptoms today Strength training completed today  Goals Unmet:  Not Applicable  Comments: Check out 1030.   Dr. Jonathan Branch is Medical Director for [facility] 

## 2022-03-09 ENCOUNTER — Encounter (HOSPITAL_COMMUNITY)
Admission: RE | Admit: 2022-03-09 | Discharge: 2022-03-09 | Disposition: A | Discharge status: Home / Self Care | Payer: PPO | Source: Ambulatory Visit | Attending: Cardiology | Admitting: Cardiology

## 2022-03-09 VITALS — Wt 209.9 lb

## 2022-03-09 DIAGNOSIS — Z955 Presence of coronary angioplasty implant and graft: Secondary | ICD-10-CM

## 2022-03-09 DIAGNOSIS — I214 Non-ST elevation (NSTEMI) myocardial infarction: Secondary | ICD-10-CM

## 2022-03-09 NOTE — Progress Notes (Signed)
Daily Session Note  Patient Details  Name: Randy Castro. MRN: [id number] Date of Birth: 01-02-1951 Referring Provider:   Flowsheet Row CARDIAC REHAB PHASE II ORIENTATION from 01/06/2022 in [place]  Referring Provider Dr. Louanne Belton       Encounter Date: 03/09/2022  Check In:  Session Check In - 03/09/22 0930       Check-In   Supervising physician immediately available to respond to emergencies [facility] immediately available    Physician(s) Dr. Phineas Inches    Location [facility]    Staff Present Redge Gainer, BS, Exercise Physiologist;Gerica Koble Wynetta Emery, RN, Bjorn Loser, MS, ACSM-CEP, Exercise Physiologist    Virtual Visit No    Medication changes reported     No    Fall or balance concerns reported    Yes    Comments He has fallen twice due to dizziness and vertigo. He also has diabetic neuropathy which alters his balance as well.    Tobacco Cessation No Change    Warm-up and Cool-down Performed as group-led instruction    Resistance Training Performed Yes    VAD Patient? No    PAD/SET Patient? No      Pain Assessment   Currently in Pain? No/denies    Pain Score 0-No pain    Multiple Pain Sites No             Capillary Blood Glucose: No results found for this or any previous visit (from the past 24 hour(s)).    Social History   Tobacco Use  Smoking Status Former   Packs/day: 1.00   Years: 25.00   Total pack years: 25.00   Types: Cigarettes   Quit date: 04/12/1992   Years since quitting: 29.9  Smokeless Tobacco Never    Goals Met:  Independence with exercise equipment Exercise tolerated well No report of concerns or symptoms today Strength training completed today  Goals Unmet:  Not Applicable  Comments: Check out 1030.   Dr. Carlyle Dolly is Medical Director for [facility]

## 2022-03-11 ENCOUNTER — Encounter (HOSPITAL_COMMUNITY)
Admission: RE | Admit: 2022-03-11 | Discharge: 2022-03-11 | Disposition: A | Discharge status: Home / Self Care | Payer: PPO | Source: Ambulatory Visit | Attending: Cardiology | Admitting: Cardiology

## 2022-03-11 DIAGNOSIS — Z955 Presence of coronary angioplasty implant and graft: Secondary | ICD-10-CM | POA: Diagnosis not present

## 2022-03-11 DIAGNOSIS — I214 Non-ST elevation (NSTEMI) myocardial infarction: Secondary | ICD-10-CM

## 2022-03-11 NOTE — Progress Notes (Signed)
Daily Session Note  Patient Details  Name: Randy Castro. MRN: [id number] Date of Birth: February 16, 1951 Referring Provider:   Flowsheet Row CARDIAC REHAB PHASE II ORIENTATION from 01/06/2022 in [place]  Referring Provider Dr. Louanne Belton       Encounter Date: 03/11/2022  Check In:  Session Check In - 03/11/22 0930       Check-In   Supervising physician immediately available to respond to emergencies [facility] MD immediately available    Physician(s) Dr Zandra Abts    Location [facility]    Staff Present Redge Gainer, BS, Exercise Physiologist;Debra Wynetta Emery, RN, Bjorn Loser, MS, ACSM-CEP, Exercise Physiologist;Phenix Grein Hassell Done, RN, BSN    Virtual Visit No    Medication changes reported     No    Fall or balance concerns reported    Yes    Comments He has fallen twice due to dizziness and vertigo. He also has diabetic neuropathy which alters his balance as well.    Tobacco Cessation No Change    Warm-up and Cool-down Performed as group-led instruction    Resistance Training Performed No    VAD Patient? No    PAD/SET Patient? No      Pain Assessment   Currently in Pain? No/denies    Pain Score 0-No pain    Multiple Pain Sites No             Capillary Blood Glucose: No results found for this or any previous visit (from the past 24 hour(s)).    Social History   Tobacco Use  Smoking Status Former   Packs/day: 1.00   Years: 25.00   Total pack years: 25.00   Types: Cigarettes   Quit date: 04/12/1992   Years since quitting: 29.9  Smokeless Tobacco Never    Goals Met:  Independence with exercise equipment Exercise tolerated well No report of concerns or symptoms today Strength training completed today  Goals Unmet:  Not Applicable  Comments: checkout at 1030.    Dr. Carlyle Dolly is Medical Director for [facility]

## 2022-03-11 NOTE — Progress Notes (Signed)
Cardiac Individual Treatment Plan  Patient Details  Name: Randy Castro. MRN: [id number] Date of Birth: 1951-03-30 Referring Provider:   Flowsheet Row CARDIAC REHAB PHASE II ORIENTATION from 01/06/2022 in [place]  Referring Provider Dr. Louanne Belton       Initial Encounter Date:  Flowsheet Row CARDIAC REHAB PHASE II ORIENTATION from 01/06/2022 in [place]  Date 01/06/22       Visit Diagnosis: NSTEMI (non-ST elevated myocardial infarction) [facility])  Status post coronary artery stent placement  Patient's Home Medications on Admission:  Current Outpatient Medications:    aspirin EC 81 MG tablet, Take 81 mg by mouth daily., Disp: , Rfl:    atorvastatin (LIPITOR) 80 MG tablet, Take 1 tablet (80 mg total) by mouth at bedtime., Disp: 90 tablet, Rfl: 1   Blood Glucose Monitoring Suppl (ONE TOUCH ULTRA 2) w/Device KIT, by Does not apply route. (Patient not taking: Reported on 01/20/2022), Disp: , Rfl:    carvedilol (COREG) 12.5 MG tablet, Take 1 tablet (12.5 mg total) by mouth 2 (two) times daily with a meal., Disp: 180 tablet, Rfl: 1   clopidogrel (PLAVIX) 75 MG tablet, Take 1 tablet (75 mg total) by mouth daily., Disp: 90 tablet, Rfl: 3   Continuous Blood Gluc Sensor (FREESTYLE LIBRE 2 SENSOR) MISC, 1 Device by Does not apply route every 14 (fourteen) days., Disp: 6 each, Rfl: 3   dapagliflozin propanediol (FARXIGA) 10 MG TABS tablet, Take 1 tablet (10 mg total) by mouth daily., Disp: 90 tablet, Rfl: 3   FEROSUL 325 (65 Fe) MG tablet, TAKE 1 TABLET BY MOUTH DAILY, Disp: 30 tablet, Rfl: 3   FLUoxetine (PROZAC) 40 MG capsule, Take 1 capsule (40 mg total) by mouth daily., Disp: 30 capsule, Rfl: 10   glucose blood (ONETOUCH ULTRA) test strip, Check blood sugar 2-3 times daily.  Dx Code: E11.9 (Patient not taking: Reported on 01/20/2022), Disp: 300 each, Rfl: 12   insulin glargine (LANTUS SOLOSTAR) 100 UNIT/ML Solostar Pen, Inject 20 Units into the skin  daily., Disp: 30 mL, Rfl: 3   magnesium oxide (MAG-OX) 400 MG tablet, Take 1 tablet (400 mg total) by mouth 2 (two) times daily., Disp: 180 tablet, Rfl: 1   metFORMIN (GLUCOPHAGE) 1000 MG tablet, Take 1 tablet (1,000 mg total) by mouth 2 (two) times daily with a meal., Disp: 180 tablet, Rfl: 1   Multiple Vitamins-Minerals (MULTIVITAMIN WITH MINERALS) tablet, Take 1 tablet by mouth daily., Disp: , Rfl:    nitroGLYCERIN (NITROSTAT) 0.4 MG SL tablet, Place 1 tablet (0.4 mg total) under the tongue every 5 (five) minutes as needed for chest pain., Disp: 25 tablet, Rfl: 3   pantoprazole (PROTONIX) 40 MG tablet, Take 1 tablet (40 mg total) by mouth 2 (two) times daily., Disp: 180 tablet, Rfl: 1   Potassium 99 MG TABS, Take 99 mg by mouth daily., Disp: , Rfl:    primidone (MYSOLINE) 50 MG tablet, TAKE THREE TABLETS BY MOUTH EVERY MORNING and TAKE TWO TABLETS BY MOUTH EVERYDAY AT BEDTIME (Patient taking differently: Take 100-150 mg by mouth See admin instructions. TAKE THREE TABLETS BY MOUTH EVERY MORNING and TAKE TWO TABLETS BY MOUTH EVERYDAY AT BEDTIME), Disp: 450 tablet, Rfl: 1   sacubitril-valsartan (ENTRESTO) 24-26 MG, Take 1 tablet by mouth 2 (two) times daily., Disp: 180 tablet, Rfl: 1   spironolactone (ALDACTONE) 25 MG tablet, Take 0.5 tablets (12.5 mg total) by mouth daily., Disp: 45 tablet, Rfl: 3   vitamin B-12 (CYANOCOBALAMIN) 1000  MCG tablet, Take 1,000 mcg by mouth daily., Disp: , Rfl:   Past Medical History: Past Medical History:  Diagnosis Date   Anxiety    Arthritis    BCC (basal cell carcinoma of skin) 12/2020   CAD (coronary artery disease)    a. NSTEMI 12/12: EF 40-45%. [id number]:[id number] balloon PTCA + Promus DES x 1 to mid LAD;  b. 11/2012: Cath with Cutting balloon PTCA  LAD for ISR to LAD, EF 55%;  c. 12/2012 Cath/PCI: LAD stents patent, 80 ost Diag (jailed)->PTCA, LCX/RCA patent.   Essential hypertension    GI bleed    History of blood transfusion    was getting 4 units    Hyperlipidemia    Ischemic cardiomyopathy    a.  echo 08/06/11: dist ant wall, apical and septal and infero-apical HK, mild LVH, EF 40%, mild LAE, PASP 34, asc aorta mildly dilated, mild TR;  b.  Echo (3/13) showed recovery of LV systolic function with EF 34-19%, grade I diastolic dysfunction, mild MR.    Myocardial infarction [facility])    twice with NSTEMI 2012   Palpitations    Type 2 diabetes mellitus ([place])     Tobacco Use: Social History   Tobacco Use  Smoking Status Former   Packs/day: 1.00   Years: 25.00   Total pack years: 25.00   Types: Cigarettes   Quit date: 04/12/1992   Years since quitting: 29.9  Smokeless Tobacco Never    Labs: Review Flowsheet  More data exists      Latest Ref Rng & Units 01/07/2021 04/17/2021 07/15/2021 11/18/2021 11/27/2021  Labs for ITP Cardiac and Pulmonary Rehab  Cholestrol 0 - 200 mg/dL - 110  - - 110   LDL (calc) 0 - 99 mg/dL - 46  - - 40   HDL-C >39.00 mg/dL - 43.80  - - 45.40   Trlycerides 0.0 - 149.0 mg/dL - 102.0  - - 121.0   Hemoglobin A1c 4.0 - 5.6 % 8.4  - 9.1  7.8  -    Capillary Blood Glucose: Lab Results  Component Value Date   GLUCAP 177 (H) 12/09/2021   GLUCAP 169 (H) 12/08/2021   GLUCAP 240 (H) 12/08/2021   GLUCAP 136 (H) 12/08/2021   GLUCAP 157 (H) 12/08/2021    POCT Glucose     Row Name 01/06/22 0819             POCT Blood Glucose   Pre-Exercise 147 mg/dL                Exercise Target Goals: Exercise Program Goal: Individual exercise prescription set using results from initial 6 min walk test and THRR while considering  patient's activity barriers and safety.   Exercise Prescription Goal: Starting with aerobic activity 30 plus minutes a day, 3 days per week for initial exercise prescription. Provide home exercise prescription and guidelines that participant acknowledges understanding prior to discharge.  Activity Barriers & Risk Stratification:  Activity Barriers & Cardiac Risk Stratification - 01/06/22  0833       Activity Barriers & Cardiac Risk Stratification   Activity Barriers Arthritis;Joint Problems;Deconditioning;Balance Concerns;History of Falls    Cardiac Risk Stratification High             6 Minute Walk:  6 Minute Walk     Row Name 01/06/22 0834         6 Minute Walk   Phase Initial     Distance 1000 feet     Walk Time  6 minutes     # of Rest Breaks 0     MPH 1.89     METS 2.18     RPE 13     VO2 Peak 63     Symptoms Yes (comment)     Comments L knee pain 3/10     Resting HR 62 bpm     Resting BP 120/76     Resting Oxygen Saturation  98 %     Exercise Oxygen Saturation  during 6 min walk 98 %     Max Ex. HR 69 bpm     Max Ex. BP 130/70     2 Minute Post BP 120/66              Oxygen Initial Assessment:   Oxygen Re-Evaluation:   Oxygen Discharge (Final Oxygen Re-Evaluation):   Initial Exercise Prescription:  Initial Exercise Prescription - 01/06/22 0800       Date of Initial Exercise RX and Referring Provider   Date 01/06/22    Referring Provider Dr. Louanne Belton    Expected Discharge Date 04/03/22      Treadmill   MPH 1    Grade 0    Minutes 17      Recumbant Elliptical   Level 1    RPM 40    Minutes 22      Prescription Details   Frequency (times per week) 3    Duration Progress to 30 minutes of continuous aerobic without signs/symptoms of physical distress      Intensity   THRR 40-80% of Max Heartrate 60-120    Ratings of Perceived Exertion 11-13      Resistance Training   Training Prescription Yes    Weight 4    Reps 10-15             Perform Capillary Blood Glucose checks as needed.  Exercise Prescription Changes:   Exercise Prescription Changes     Row Name 01/12/22 1100 01/16/22 1000 01/26/22 1500 02/09/22 1000 02/23/22 1000     Response to Exercise   Blood Pressure (Admit) 120/56 -- 140/70 120/60 128/70   Blood Pressure (Exercise) 130/60 -- 127/62 122/60 130/60   Blood Pressure (Exit) 120/58 -- 118/54  92/56 116/60   Heart Rate (Admit) 61 bpm -- 57 bpm 62 bpm 60 bpm   Heart Rate (Exercise) 71 bpm -- 77 bpm 77 bpm 87 bpm   Heart Rate (Exit) 63 bpm -- 66 bpm 69 bpm 69 bpm   Rating of Perceived Exertion (Exercise) 12 -- 12 13 13    Duration Continue with 30 min of aerobic exercise without signs/symptoms of physical distress. -- Continue with 30 min of aerobic exercise without signs/symptoms of physical distress. Continue with 30 min of aerobic exercise without signs/symptoms of physical distress. Continue with 30 min of aerobic exercise without signs/symptoms of physical distress.   Intensity THRR unchanged -- THRR unchanged THRR unchanged THRR unchanged     Progression   Progression Continue to progress workloads to maintain intensity without signs/symptoms of physical distress. -- Continue to progress workloads to maintain intensity without signs/symptoms of physical distress. Continue to progress workloads to maintain intensity without signs/symptoms of physical distress. Continue to progress workloads to maintain intensity without signs/symptoms of physical distress.     Resistance Training   Training Prescription Yes -- Yes Yes Yes   Weight 3 -- 5 5 5    Reps 10-15 -- 10-15 10-15 10-15   Time 10 Minutes -- 10 Minutes 10  Minutes 10 Minutes     Treadmill   MPH 1 -- 1.8 2 2.1   Grade 0 -- 0 1 3   Minutes 17 -- 17 17 17    METs 1.77 -- 2.38 2.81 3.48     Recumbant Elliptical   Level 1 -- 1 3 4    RPM 42 -- 58 57 61   Minutes 22 -- 22 22 22    METs 2.3 -- 3.4 3.4 3.8     Home Exercise Plan   Plans to continue exercise at -- Home (comment) -- -- --   Frequency -- Add 2 additional days to program exercise sessions. -- -- --   Initial Home Exercises Provided -- 01/16/22 -- -- --    [place] Name 03/09/22 1000             Response to Exercise   Blood Pressure (Admit) 130/60       Blood Pressure (Exercise) 116/60       Blood Pressure (Exit) 100/60       Heart Rate (Admit) 56 bpm        Heart Rate (Exercise) 84 bpm       Heart Rate (Exit) 65 bpm       Rating of Perceived Exertion (Exercise) 13       Duration Continue with 30 min of aerobic exercise without signs/symptoms of physical distress.       Intensity THRR unchanged         Progression   Progression Continue to progress workloads to maintain intensity without signs/symptoms of physical distress.         Resistance Training   Training Prescription Yes       Weight 5       Reps 10-15       Time 10 Minutes         Treadmill   MPH 2.1       Grade 3.5       Minutes 17       METs 3.62         Recumbant Elliptical   Level 6       RPM 57       Minutes 22       METs 3.8                Exercise Comments:   Exercise Comments     Row Name 01/16/22 1018           Exercise Comments home exercise reviewed                Exercise Goals and Review:   Exercise Goals     Row Name 01/12/22 1142 02/09/22 1042 03/09/22 1036         Exercise Goals   Increase Physical Activity Yes Yes Yes     Intervention Provide advice, education, support and counseling about physical activity/exercise needs.;Develop an individualized exercise prescription for aerobic and resistive training based on initial evaluation findings, risk stratification, comorbidities and participant's personal goals. Provide advice, education, support and counseling about physical activity/exercise needs.;Develop an individualized exercise prescription for aerobic and resistive training based on initial evaluation findings, risk stratification, comorbidities and participant's personal goals. Provide advice, education, support and counseling about physical activity/exercise needs.;Develop an individualized exercise prescription for aerobic and resistive training based on initial evaluation findings, risk stratification, comorbidities and participant's personal goals.     Expected Outcomes Short Term: Attend rehab on a regular basis to increase  amount of physical  activity.;Long Term: Add in home exercise to make exercise part of routine and to increase amount of physical activity.;Long Term: Exercising regularly at least 3-5 days a week. Short Term: Attend rehab on a regular basis to increase amount of physical activity.;Long Term: Add in home exercise to make exercise part of routine and to increase amount of physical activity.;Long Term: Exercising regularly at least 3-5 days a week. Short Term: Attend rehab on a regular basis to increase amount of physical activity.;Long Term: Add in home exercise to make exercise part of routine and to increase amount of physical activity.;Long Term: Exercising regularly at least 3-5 days a week.     Increase Strength and Stamina Yes Yes Yes     Intervention Provide advice, education, support and counseling about physical activity/exercise needs.;Develop an individualized exercise prescription for aerobic and resistive training based on initial evaluation findings, risk stratification, comorbidities and participant's personal goals. Provide advice, education, support and counseling about physical activity/exercise needs.;Develop an individualized exercise prescription for aerobic and resistive training based on initial evaluation findings, risk stratification, comorbidities and participant's personal goals. Provide advice, education, support and counseling about physical activity/exercise needs.;Develop an individualized exercise prescription for aerobic and resistive training based on initial evaluation findings, risk stratification, comorbidities and participant's personal goals.     Expected Outcomes Short Term: Increase workloads from initial exercise prescription for resistance, speed, and METs.;Short Term: Perform resistance training exercises routinely during rehab and add in resistance training at home;Long Term: Improve cardiorespiratory fitness, muscular endurance and strength as measured by increased METs  and functional capacity (6MWT) Short Term: Increase workloads from initial exercise prescription for resistance, speed, and METs.;Short Term: Perform resistance training exercises routinely during rehab and add in resistance training at home;Long Term: Improve cardiorespiratory fitness, muscular endurance and strength as measured by increased METs and functional capacity (6MWT) Short Term: Increase workloads from initial exercise prescription for resistance, speed, and METs.;Short Term: Perform resistance training exercises routinely during rehab and add in resistance training at home;Long Term: Improve cardiorespiratory fitness, muscular endurance and strength as measured by increased METs and functional capacity (6MWT)     Able to understand and use rate of perceived exertion (RPE) scale Yes Yes Yes     Intervention Provide education and explanation on how to use RPE scale Provide education and explanation on how to use RPE scale Provide education and explanation on how to use RPE scale     Expected Outcomes Short Term: Able to use RPE daily in rehab to express subjective intensity level;Long Term:  Able to use RPE to guide intensity level when exercising independently Short Term: Able to use RPE daily in rehab to express subjective intensity level;Long Term:  Able to use RPE to guide intensity level when exercising independently Short Term: Able to use RPE daily in rehab to express subjective intensity level;Long Term:  Able to use RPE to guide intensity level when exercising independently     Knowledge and understanding of Target Heart Rate Range (THRR) Yes Yes Yes     Intervention Provide education and explanation of THRR including how the numbers were predicted and where they are located for reference Provide education and explanation of THRR including how the numbers were predicted and where they are located for reference Provide education and explanation of THRR including how the numbers were predicted  and where they are located for reference     Expected Outcomes Short Term: Able to state/look up THRR;Long Term: Able to use THRR to govern  intensity when exercising independently;Short Term: Able to use daily as guideline for intensity in rehab Short Term: Able to state/look up THRR;Long Term: Able to use THRR to govern intensity when exercising independently;Short Term: Able to use daily as guideline for intensity in rehab Short Term: Able to state/look up THRR;Long Term: Able to use THRR to govern intensity when exercising independently;Short Term: Able to use daily as guideline for intensity in rehab     Able to check pulse independently Yes Yes Yes     Intervention Provide education and demonstration on how to check pulse in carotid and radial arteries.;Review the importance of being able to check your own pulse for safety during independent exercise Provide education and demonstration on how to check pulse in carotid and radial arteries.;Review the importance of being able to check your own pulse for safety during independent exercise Provide education and demonstration on how to check pulse in carotid and radial arteries.;Review the importance of being able to check your own pulse for safety during independent exercise     Expected Outcomes Short Term: Able to explain why pulse checking is important during independent exercise;Long Term: Able to check pulse independently and accurately Short Term: Able to explain why pulse checking is important during independent exercise;Long Term: Able to check pulse independently and accurately Short Term: Able to explain why pulse checking is important during independent exercise;Long Term: Able to check pulse independently and accurately     Understanding of Exercise Prescription Yes Yes Yes     Intervention Provide education, explanation, and written materials on patient's individual exercise prescription Provide education, explanation, and written materials on  patient's individual exercise prescription Provide education, explanation, and written materials on patient's individual exercise prescription     Expected Outcomes Short Term: Able to explain program exercise prescription;Long Term: Able to explain home exercise prescription to exercise independently Short Term: Able to explain program exercise prescription;Long Term: Able to explain home exercise prescription to exercise independently Short Term: Able to explain program exercise prescription;Long Term: Able to explain home exercise prescription to exercise independently              Exercise Goals Re-Evaluation :  Exercise Goals Re-Evaluation     Row Name 01/12/22 1151 02/09/22 1042 03/09/22 1033         Exercise Goal Re-Evaluation   Exercise Goals Review Increase Physical Activity;Increase Strength and Stamina;Able to understand and use rate of perceived exertion (RPE) scale;Knowledge and understanding of Target Heart Rate Range (THRR);Able to check pulse independently;Understanding of Exercise Prescription Increase Physical Activity;Increase Strength and Stamina;Able to understand and use rate of perceived exertion (RPE) scale;Knowledge and understanding of Target Heart Rate Range (THRR);Able to check pulse independently;Understanding of Exercise Prescription Increase Physical Activity;Increase Strength and Stamina;Able to understand and use rate of perceived exertion (RPE) scale;Knowledge and understanding of Target Heart Rate Range (THRR);Able to check pulse independently;Understanding of Exercise Prescription     Comments Pt has completed his first session of cardiac rehab. He is motviated amd ready to improve his strength. He is currently exercising at 2.3 METs on the ellp. Will continue to monitor and progress as able. Pt has completed 12 sessions of PR. He has been able to progress his workloads on both exercise stations and is motivated to continue to progress in the program. He is  currently exercising at 3.4 METs on the seated elliptical. Will continue to monitor and progress as able. Pt has completed 21 sessions of cardiac rehab. He is verymotivated during  class and increase his workload. He is walking on his day outside of class. He is currently exercising at 3.8 METs on the ellp. Will continue to montior and progress as able.     Expected Outcomes Through exercise at home and at rehab, the patient will meet their stated goals. Through exercise at home and at rehab, the patient will meet their stated goals. Through exercise at home and at rehab, the patient will meet their stated goals.               Discharge Exercise Prescription (Final Exercise Prescription Changes):  Exercise Prescription Changes - 03/09/22 1000       Response to Exercise   Blood Pressure (Admit) 130/60    Blood Pressure (Exercise) 116/60    Blood Pressure (Exit) 100/60    Heart Rate (Admit) 56 bpm    Heart Rate (Exercise) 84 bpm    Heart Rate (Exit) 65 bpm    Rating of Perceived Exertion (Exercise) 13    Duration Continue with 30 min of aerobic exercise without signs/symptoms of physical distress.    Intensity THRR unchanged      Progression   Progression Continue to progress workloads to maintain intensity without signs/symptoms of physical distress.      Resistance Training   Training Prescription Yes    Weight 5    Reps 10-15    Time 10 Minutes      Treadmill   MPH 2.1    Grade 3.5    Minutes 17    METs 3.62      Recumbant Elliptical   Level 6    RPM 57    Minutes 22    METs 3.8             Nutrition:  Target Goals: Understanding of nutrition guidelines, daily intake of sodium <1559m, cholesterol <2075m calories 30% from fat and 7% or less from saturated fats, daily to have 5 or more servings of fruits and vegetables.  Biometrics:    Nutrition Therapy Plan and Nutrition Goals:  Nutrition Therapy & Goals - 01/12/22 0911       Personal Nutrition Goals    Comments Patient scored 61 on his diet assessment. We offer 2 educational sessions on heart healthy nutrition with handouts.      Intervention Plan   Intervention Nutrition handout(s) given to patient.    Expected Outcomes Short Term Goal: Understand basic principles of dietary content, such as calories, fat, sodium, cholesterol and nutrients.             Nutrition Assessments:  Nutrition Assessments - 01/06/22 0809       MEDFICTS Scores   Pre Score 61            MEDIFICTS Score Key: ?70 Need to make dietary changes  40-70 Heart Healthy Diet ? 40 Therapeutic Level Cholesterol Diet   Picture Your Plate Scores: <4<37nhealthy dietary pattern with much room for improvement. 41-50 Dietary pattern unlikely to meet recommendations for good health and room for improvement. 51-60 More healthful dietary pattern, with some room for improvement.  >60 Healthy dietary pattern, although there may be some specific behaviors that could be improved.    Nutrition Goals Re-Evaluation:   Nutrition Goals Discharge (Final Nutrition Goals Re-Evaluation):   Psychosocial: Target Goals: Acknowledge presence or absence of significant depression and/or stress, maximize coping skills, provide positive support system. Participant is able to verbalize types and ability to use techniques and skills needed for reducing stress  and depression.  Initial Review & Psychosocial Screening:  Initial Psych Review & Screening - 01/06/22 0837       Initial Review   Current issues with Current Anxiety/Panic      Family Dynamics   Good Support System? Yes    Comments His wife and two children are his support system.      Barriers   Psychosocial barriers to participate in program There are no identifiable barriers or psychosocial needs.      Screening Interventions   Interventions Encouraged to exercise    Expected Outcomes Long Term goal: The participant improves quality of Life and PHQ9 Scores as  seen by post scores and/or verbalization of changes;Short Term goal: Identification and review with participant of any Quality of Life or Depression concerns found by scoring the questionnaire.             Quality of Life Scores:  Quality of Life - 01/06/22 0837       Quality of Life   Select Quality of Life      Quality of Life Scores   Health/Function Pre 25.2 %    Socioeconomic Pre 28.29 %    Psych/Spiritual Pre 30 %    Family Pre 27.6 %    GLOBAL Pre 27.18 %            Scores of 19 and below usually indicate a poorer quality of life in these areas.  A difference of  2-3 points is a clinically meaningful difference.  A difference of 2-3 points in the total score of the Quality of Life Index has been associated with significant improvement in overall quality of life, self-image, physical symptoms, and general health in studies assessing change in quality of life.  PHQ-9: Review Flowsheet  More data exists      01/06/2022 11/27/2021 10/21/2021 07/23/2021 06/02/2021  Depression screen PHQ 2/9  Decreased Interest 0 0 1 0 0  Down, Depressed, Hopeless 0 0 0 0 0  PHQ - 2 Score 0 0 1 0 0  Altered sleeping 0 - 1 - -  Tired, decreased energy 2 - 2 - -  Change in appetite 1 - 1 - -  Feeling bad or failure about yourself  0 - 0 - -  Trouble concentrating 0 - 1 - -  Moving slowly or fidgety/restless 0 - 0 - -  Suicidal thoughts 0 - 0 - -  PHQ-9 Score 3 - 6 - -  Difficult doing work/chores Somewhat difficult - Somewhat difficult - -   Interpretation of Total Score  Total Score Depression Severity:  1-4 = Minimal depression, 5-9 = Mild depression, 10-14 = Moderate depression, 15-19 = Moderately severe depression, 20-27 = Severe depression   Psychosocial Evaluation and Intervention:  Psychosocial Evaluation - 01/06/22 0904       Psychosocial Evaluation & Interventions   Interventions Encouraged to exercise with the program and follow exercise prescription    Comments Pt has  no barriers to participating in CR. He is treated for anxiety with fluoxetine 40 mg. He has been treated for anxiety for several years now, and this medication helps him and that his anxiety is under control. He has no other identifiable psychosocial issues. 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 He also reports not having much of an appetite. He underwent a gastric bypass surgery several years ago and lost over 100 lbs. He reports that  his appetite is small due to this. He reports that he has a good support system with his wife, children, and grandchildren. He lives with his wife, and his the rest of his family lives close by. He states that his goals while in the program are to improve his strength and stamina. He is eager to start the program.    Expected Outcomes Pt's anxiety will continue to be medically managed, and he will have no other identifiable psychosocial issues.    Continue Psychosocial Services  No Follow up required             Psychosocial Re-Evaluation:  Psychosocial Re-Evaluation     [place] Name 01/12/22 [phone] 02/02/22 0719 03/02/22 4403         Psychosocial Re-Evaluation   Current issues with Current Anxiety/Panic Current Anxiety/Panic --     Comments Patient is new to the program. He plans to start today. His anxiety is managed with Fluoxetine. We will continue to monitor his progress. Patient has completed 9 sessions. He is doing well enjoying the program interating with staff and other patient's in his class. His anxiety continues to be managed with Fluoxetine and he continues to have no psychosocial barriers identified. We will continue to monitor. Patient has completed 18 sessions. He continues to do well enjoying the program interating with staff and other patient's in his class. His anxiety continues to be managed with Fluoxetine and he continues to have no psychosocial barriers identified. We will continue to  monitor.     Expected Outcomes Patient will continue to have no psychosocial barriers identified and his anxiety will continue to be managed. Patient will continue to have no psychosocial barriers identified and his anxiety will continue to be managed. Patient will continue to have no psychosocial barriers identified and his anxiety will continue to be managed.     Interventions Stress management education;Encouraged to attend Cardiac Rehabilitation for the exercise;Relaxation education Stress management education;Encouraged to attend Cardiac Rehabilitation for the exercise;Relaxation education Stress management education;Encouraged to attend Cardiac Rehabilitation for the exercise;Relaxation education     Continue Psychosocial Services  No Follow up required No Follow up required No Follow up required              Psychosocial Discharge (Final Psychosocial Re-Evaluation):  Psychosocial Re-Evaluation - 03/02/22 0727       Psychosocial Re-Evaluation   Comments Patient has completed 18 sessions. He continues to do well enjoying the program interating with staff and other patient's in his class. His anxiety continues to be managed with Fluoxetine and he continues to have no psychosocial barriers identified. We will continue to monitor.    Expected Outcomes Patient will continue to have no psychosocial barriers identified and his anxiety will continue to be managed.    Interventions Stress management education;Encouraged to attend Cardiac Rehabilitation for the exercise;Relaxation education    Continue Psychosocial Services  No Follow up required             Vocational Rehabilitation: Provide vocational rehab assistance to qualifying candidates.   Vocational Rehab Evaluation & Intervention:  Vocational Rehab - 01/06/22 0808       Initial Vocational Rehab Evaluation & Intervention   Assessment shows need for Vocational Rehabilitation No      Vocational Rehab Re-Evaulation   Comments  He is retired and not interested in returning to work             Education: Education Goals: Education classes will be provided on a  weekly basis, covering required topics. Participant will state understanding/return demonstration of topics presented.  Learning Barriers/Preferences:  Learning Barriers/Preferences - 01/06/22 0840       Learning Barriers/Preferences   Learning Barriers None    Learning Preferences Skilled Demonstration             Education Topics: Hypertension, Hypertension Reduction -Define heart disease and high blood pressure. Discus how high blood pressure affects the body and ways to reduce high blood pressure. Flowsheet Row CARDIAC REHAB PHASE II EXERCISE from 03/04/2022 in [place]  Date 02/25/22  Educator [place]  Instruction Review Code 1- Verbalizes Understanding       Exercise and Your Heart -Discuss why it is important to exercise, the FITT principles of exercise, normal and abnormal responses to exercise, and how to exercise safely. Flowsheet Row CARDIAC REHAB PHASE II EXERCISE from 03/04/2022 in [place]  Date 03/04/22  Educator [place]  Instruction Review Code 1- Verbalizes Understanding       Angina -Discuss definition of angina, causes of angina, treatment of angina, and how to decrease risk of having angina.   Cardiac Medications -Review what the following cardiac medications are used for, how they affect the body, and side effects that may occur when taking the medications.  Medications include Aspirin, Beta blockers, calcium channel blockers, ACE Inhibitors, angiotensin receptor blockers, diuretics, digoxin, and antihyperlipidemics.   Congestive Heart Failure -Discuss the definition of CHF, how to live with CHF, the signs and symptoms of CHF, and how keep track of weight and sodium intake.   Heart Disease and Intimacy -Discus the effect sexual activity has on the heart, how changes  occur during intimacy as we age, and safety during sexual activity.   Smoking Cessation / COPD -Discuss different methods to quit smoking, the health benefits of quitting smoking, and the definition of COPD.   Nutrition I: Fats -Discuss the types of cholesterol, what cholesterol does to the heart, and how cholesterol levels can be controlled. Flowsheet Row CARDIAC REHAB PHASE II EXERCISE from 03/04/2022 in [place]  Date 01/14/22  Educator [place]  Instruction Review Code 1- Verbalizes Understanding       Nutrition II: Labels -Discuss the different components of food labels and how to read food label [place] from 03/04/2022 in [place]  Date 01/28/22  Educator DF  Instruction Review Code 1- Verbalizes Understanding       Heart Parts/Heart Disease and PAD -Discuss the anatomy of the heart, the pathway of blood circulation through the heart, and these are affected by heart disease.   Stress I: Signs and Symptoms -Discuss the causes of stress, how stress may lead to anxiety and depression, and ways to limit stress. Flowsheet Row CARDIAC REHAB PHASE II EXERCISE from 03/04/2022 in [place]  Date 02/11/22  Educator hj  Instruction Review Code 2- Demonstrated Understanding       Stress II: Relaxation -Discuss different types of relaxation techniques to limit stress.   Warning Signs of Stroke / TIA -Discuss definition of a stroke, what the signs and symptoms are of a stroke, and how to identify when someone is having stroke.   Knowledge Questionnaire Score:  Knowledge Questionnaire Score - 01/06/22 0808       Knowledge Questionnaire Score   Pre Score 20/24             Core Components/Risk Factors/Patient Goals at Admission:  Personal Goals  and Risk Factors at Admission - 01/06/22 0843       Core Components/Risk Factors/Patient Goals on Admission    Diabetes Yes    Intervention Provide education about signs/symptoms and action to take for hypo/hyperglycemia.;Provide education about proper nutrition, including hydration, and aerobic/resistive exercise prescription along with prescribed medications to achieve blood glucose in normal ranges: Fasting glucose 65-99 mg/dL    Expected Outcomes Short Term: Participant verbalizes understanding of the signs/symptoms and immediate care of hyper/hypoglycemia, proper foot care and importance of medication, aerobic/resistive exercise and nutrition plan for blood glucose control.;Long Term: Attainment of HbA1C < 7%.    Hypertension Yes    Intervention Provide education on lifestyle modifcations including regular physical activity/exercise, weight management, moderate sodium restriction and increased consumption of fresh fruit, vegetables, and low fat dairy, alcohol moderation, and smoking cessation.;Monitor prescription use compliance.    Expected Outcomes Short Term: Continued assessment and intervention until BP is < 140/74m HG in hypertensive participants. < 130/882mHG in hypertensive participants with diabetes, heart failure or chronic kidney disease.;Long Term: Maintenance of blood pressure at goal levels.    Lipids Yes    Intervention Provide education and support for participant on nutrition & aerobic/resistive exercise along with prescribed medications to achieve LDL <7031mHDL >35m58m  Expected Outcomes Short Term: Participant states understanding of desired cholesterol values and is compliant with medications prescribed. Participant is following exercise prescription and nutrition guidelines.;Long Term: Cholesterol controlled with medications as prescribed, with individualized exercise RX and with personalized nutrition plan. Value goals: LDL < 70mg8mL > 40 mg.    Personal Goal Other Yes    Personal Goal Improve strength and stamina    Intervention Attend CR three days per week and exercise at home  2-4 days per week.    Expected Outcomes He will report improvements in his strength and stamina. He will be able to improve his six minute walk test distance at discharge.             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 He has mutiple risk factors for CAD and is participating in the program for risk modification. He plans to start today. His DM is managed with insulin, metformin, and farxiga. His personal goals for the program are to improve his strength and stamina. We will continue to monior his progress as he works towards meeting these goals. Patient has completed 9 sessions with his current weight 209.1 lbs losing 5 lbs since last 30 day review. He is doing well in the program with consistent attendance and progressions. Last A1C on file was 5/22 at 8.4%. He is on Lantus and Metformin for DM control. His reported home readings have been labile. His blood pressure is at goal. His personal goals for the program continue to be to improve his strength and stamina. We will continue to monitor his progress as he works towards meeting these personal goals. Patient has completed 18 sessions with his current weight 207.9 lbs losing 2 lbs since last 30 day review. He continues to do well in the program with consistent attendance and progressions. Last A1C on file was 11/22 at 9.7% trending up. He is on Lantus,  Farxiga and Metformin for DM control. His blood pressure is at goal with a few high readings. His personal goals for the program continue to be to improve his strength and stamina. He  says he is feeling stronger with more energy. We will continue to monitor his progress as he works towards meeting these personal goals.     Expected Outcomes Patient will complete the program meeting both personal and program goals. Patient will complete the program meeting both personal and program goals. Patient will complete the program meeting both personal and program goals.              Core Components/Risk Factors/Patient Goals at Discharge (Final Review):   Goals and Risk Factor Review - 03/02/22 0728       Core Components/Risk Factors/Patient Goals Review   Personal Goals Review Diabetes;Lipids;Hypertension;Other;Weight Management/Obesity    Review Patient has completed 18 sessions with his current weight 207.9 lbs losing 2 lbs since last 30 day review. He continues to do well in the program with consistent attendance and progressions. Last A1C on file was 11/22 at 9.7% trending up. He is on Lantus, Farxiga and Metformin for DM control. His blood pressure is at goal with a few high readings. His personal goals for the program continue to be to improve his strength and stamina. He says he is feeling stronger with more energy. We will continue to monitor his progress as he works towards meeting these personal goals.    Expected Outcomes Patient will complete the program meeting both personal and program goals.             ITP Comments:   Comments: ITP REVIEW Pt is making expected progress toward Cardiac Rehab goals after completing 21 sessions. Recommend continued exercise, life style modification, education, and increased stamina and strength.

## 2022-03-13 ENCOUNTER — Encounter (HOSPITAL_COMMUNITY)
Admission: RE | Admit: 2022-03-13 | Discharge: 2022-03-13 | Disposition: A | Discharge status: Home / Self Care | Payer: PPO | Source: Ambulatory Visit | Attending: Cardiology | Admitting: Cardiology

## 2022-03-13 DIAGNOSIS — Z955 Presence of coronary angioplasty implant and graft: Secondary | ICD-10-CM | POA: Diagnosis not present

## 2022-03-13 DIAGNOSIS — I214 Non-ST elevation (NSTEMI) myocardial infarction: Secondary | ICD-10-CM

## 2022-03-13 NOTE — Progress Notes (Signed)
Daily Session Note  Patient Details  Name: Randy S Kopko Jr. MRN: [id number] Date of Birth: 04/13/1951 Referring Provider:   Flowsheet Row CARDIAC REHAB PHASE II ORIENTATION from 01/06/2022 in [place] [facility]  Referring Provider Dr. Pokhrel       Encounter Date: 03/13/2022  Check In:  Session Check In - 03/13/22 0924       Check-In   Supervising physician immediately available to respond to emergencies [facility] MD immediately available    Physician(s) Dr J. Branch    Location [facility]    Staff Present Heather Jachimiak, BS, Exercise Physiologist;Dalton Fletcher, MS, ACSM-CEP, Exercise Physiologist;Daphyne Martin, RN, BSN    Virtual Visit No    Medication changes reported     No    Fall or balance concerns reported    Yes    Comments He has fallen twice due to dizziness and vertigo. He also has diabetic neuropathy which alters his balance as well.    Tobacco Cessation No Change    Warm-up and Cool-down Performed as group-led instruction    Resistance Training Performed Yes    VAD Patient? No    PAD/SET Patient? No      Pain Assessment   Currently in Pain? No/denies    Pain Score 0-No pain    Multiple Pain Sites No             Capillary Blood Glucose: No results found for this or any previous visit (from the past 24 hour(s)).    Social History   Tobacco Use  Smoking Status Former   Packs/day: 1.00   Years: 25.00   Total pack years: 25.00   Types: Cigarettes   Quit date: 04/12/1992   Years since quitting: 29.9  Smokeless Tobacco Never    Goals Met:  Independence with exercise equipment Exercise tolerated well No report of concerns or symptoms today Strength training completed today  Goals Unmet:  Not Applicable  Comments: checkout at 1030.   Dr. Jonathan Branch is Medical Director for [facility] 

## 2022-03-15 NOTE — Patient Instructions (Signed)
Randy Castro,  It was a pleasure speaking with you  Below is a summary of your health goals and care plan   Patient Goals/Self-Care Activities Over the next 90 days, patient will:  take medications as prescribed Adherence strategies to try Place morning and evening medications by toothbrush to remember to take Drink a glass of water every morning and evening and place medications by sink in kitchen to improve adherence Set an alarm / reminder on your phone for morning and evening medication doses.  check glucose using Continuous Glucose Monitor prior to meals and at other times during the day when needed. Will review Continuous Glucose Monitor reports during appointments in future. Remember to bring reader / cell phone so our office can review Continuous Glucose Monitor reports.  check blood pressure 1 to 2 times per week, document, and provide at future appointments engage in dietary modifications by decreasing intake of high sugar / CHO snacks and limiting serving sizes Applied for [employer] - approved for $1500 thru 02/2023. Take [place] to pharmacy to use when you refill Entresto.     If you have any questions or concerns, please feel free to contact me either at the phone number below or with a MyChart message.   Keep up the good work!  [name], PharmD Clinical Pharmacist [facility] [phone] (direct line)  [phone] (main office number)   Patient verbalizes understanding of instructions and care plan provided today and agrees to view in [place]. Active MyChart status and patient understanding of how to access instructions and care plan via MyChart confirmed with patient.

## 2022-03-16 ENCOUNTER — Telehealth: Payer: Self-pay | Admitting: Pharmacist

## 2022-03-16 ENCOUNTER — Encounter (HOSPITAL_COMMUNITY)
Admission: RE | Admit: 2022-03-16 | Discharge: 2022-03-16 | Disposition: A | Discharge status: Home / Self Care | Payer: PPO | Source: Ambulatory Visit | Attending: Cardiology | Admitting: Cardiology

## 2022-03-16 DIAGNOSIS — Z955 Presence of coronary angioplasty implant and graft: Secondary | ICD-10-CM | POA: Diagnosis not present

## 2022-03-16 DIAGNOSIS — I214 Non-ST elevation (NSTEMI) myocardial infarction: Secondary | ICD-10-CM

## 2022-03-16 NOTE — Telephone Encounter (Signed)
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 ($10,000 through 02/13/2023) Also contacted pharmacy with needed information to process his upcoming refill for Entresto with [employer]. Also sent to [employer] to be scanned into his chart.

## 2022-03-16 NOTE — Progress Notes (Signed)
Daily Session Note  Patient Details  Name: Randy Castro. MRN: [id number] Date of Birth: 02-Feb-1951 Referring Provider:   Flowsheet Row CARDIAC REHAB PHASE II ORIENTATION from 01/06/2022 in [place]  Referring Provider Dr. Louanne Belton       Encounter Date: 03/16/2022  Check In:  Session Check In - 03/16/22 0927       Check-In   Supervising physician immediately available to respond to emergencies [facility] MD immediately available    Physician(s) Dr Domenic Polite    Location [facility]    Staff Present Hoy Register, MS, ACSM-CEP, Exercise Physiologist;Moataz Tavis Hassell Done, RN, Jennye Moccasin, RN, BSN    Virtual Visit No    Medication changes reported     No    Fall or balance concerns reported    Yes    Comments He has fallen twice due to dizziness and vertigo. He also has diabetic neuropathy which alters his balance as well.    Tobacco Cessation No Change    Warm-up and Cool-down Performed as group-led instruction    Resistance Training Performed Yes    VAD Patient? No    PAD/SET Patient? No      Pain Assessment   Currently in Pain? No/denies    Pain Score 0-No pain    Multiple Pain Sites No             Capillary Blood Glucose: No results found for this or any previous visit (from the past 24 hour(s)).    Social History   Tobacco Use  Smoking Status Former   Packs/day: 1.00   Years: 25.00   Total pack years: 25.00   Types: Cigarettes   Quit date: 04/12/1992   Years since quitting: 29.9  Smokeless Tobacco Never    Goals Met:  Independence with exercise equipment Exercise tolerated well No report of concerns or symptoms today Strength training completed today  Goals Unmet:  Not Applicable  Comments: Checkout at 1030.   Dr. Carlyle Dolly is Medical Director for [facility]

## 2022-03-18 ENCOUNTER — Encounter (HOSPITAL_COMMUNITY)
Admission: RE | Admit: 2022-03-18 | Discharge: 2022-03-18 | Disposition: A | Discharge status: Home / Self Care | Payer: PPO | Source: Ambulatory Visit | Attending: Cardiology | Admitting: Cardiology

## 2022-03-18 DIAGNOSIS — I214 Non-ST elevation (NSTEMI) myocardial infarction: Secondary | ICD-10-CM

## 2022-03-18 DIAGNOSIS — Z955 Presence of coronary angioplasty implant and graft: Secondary | ICD-10-CM

## 2022-03-18 NOTE — Progress Notes (Signed)
Daily Session Note  Patient Details  Name: Randy Castro. MRN: [id number] Date of Birth: [birth year] Referring Provider:   Flowsheet Row CARDIAC REHAB PHASE II ORIENTATION from 01/06/2022 in [place]  Referring Provider Dr. Louanne Belton       Encounter Date: 03/18/2022  Check In:  Session Check In - 03/18/22 0924       Check-In   Supervising physician immediately available to respond to emergencies [facility] MD immediately available    Physician(s) Dr Johnsie Cancel    Location [facility]    Staff Present Hoy Register, MS, ACSM-CEP, Exercise Physiologist;Fenix Ruppe Hassell Done, RN, BSN    Virtual Visit No    Medication changes reported     No    Fall or balance concerns reported    Yes    Comments He has fallen twice due to dizziness and vertigo. He also has diabetic neuropathy which alters his balance as well.    Tobacco Cessation No Change    Warm-up and Cool-down Performed as group-led instruction    Resistance Training Performed Yes    VAD Patient? No    PAD/SET Patient? No      Pain Assessment   Currently in Pain? No/denies    Pain Score 0-No pain    Multiple Pain Sites No             Capillary Blood Glucose: No results found for this or any previous visit (from the past 24 hour(s)).    Social History   Tobacco Use  Smoking Status Former   Packs/day: 1.00   Years: 25.00   Total pack years: 25.00   Types: Cigarettes   Quit date: 04/12/1992   Years since quitting: 29.9  Smokeless Tobacco Never    Goals Met:  Independence with exercise equipment Exercise tolerated well No report of concerns or symptoms today Strength training completed today  Goals Unmet:  Not Applicable  Comments: Checkout at 1030.   Dr. Carlyle Dolly is Medical Director for [facility]

## 2022-03-20 ENCOUNTER — Encounter (HOSPITAL_COMMUNITY): Payer: PPO

## 2022-03-23 ENCOUNTER — Encounter (HOSPITAL_COMMUNITY)
Admission: RE | Admit: 2022-03-23 | Discharge: 2022-03-23 | Disposition: A | Discharge status: Home / Self Care | Payer: PPO | Source: Ambulatory Visit | Attending: Cardiology | Admitting: Cardiology

## 2022-03-23 VITALS — Wt 211.2 lb

## 2022-03-23 DIAGNOSIS — I214 Non-ST elevation (NSTEMI) myocardial infarction: Secondary | ICD-10-CM

## 2022-03-23 DIAGNOSIS — Z955 Presence of coronary angioplasty implant and graft: Secondary | ICD-10-CM | POA: Diagnosis not present

## 2022-03-23 DIAGNOSIS — Z794 Long term (current) use of insulin: Secondary | ICD-10-CM

## 2022-03-23 DIAGNOSIS — I5022 Chronic systolic (congestive) heart failure: Secondary | ICD-10-CM | POA: Diagnosis not present

## 2022-03-23 DIAGNOSIS — E1165 Type 2 diabetes mellitus with hyperglycemia: Secondary | ICD-10-CM | POA: Diagnosis not present

## 2022-03-23 NOTE — Progress Notes (Signed)
Daily Session Note  Patient Details  Name: Randy S Mcbrien Jr. MRN: [id number] Date of Birth: 04/27/1951 Referring Provider:   Flowsheet Row CARDIAC REHAB PHASE II ORIENTATION from 01/06/2022 in [place] [facility]  Referring Provider Dr. Pokhrel       Encounter Date: 03/23/2022  Check In:  Session Check In - 03/23/22 0930       Check-In   Supervising physician immediately available to respond to emergencies [facility] MD immediately available    Physician(s) Dr. Ross    Location [facility]    Staff Present Heather Jachimiak, BS, Exercise Physiologist;Dalton Fletcher, MS, ACSM-CEP, Exercise Physiologist    Virtual Visit No    Medication changes reported     No    Fall or balance concerns reported    Yes    Comments He has fallen twice due to dizziness and vertigo. He also has diabetic neuropathy which alters his balance as well.    Tobacco Cessation No Change    Warm-up and Cool-down Performed as group-led instruction    Resistance Training Performed Yes    VAD Patient? No    PAD/SET Patient? No      Pain Assessment   Currently in Pain? No/denies    Pain Score 0-No pain    Multiple Pain Sites No             Capillary Blood Glucose: No results found for this or any previous visit (from the past 24 hour(s)).    Social History   Tobacco Use  Smoking Status Former   Packs/day: 1.00   Years: 25.00   Total pack years: 25.00   Types: Cigarettes   Quit date: 04/12/1992   Years since quitting: 29.9  Smokeless Tobacco Never    Goals Met:  Independence with exercise equipment Exercise tolerated well No report of concerns or symptoms today Strength training completed today  Goals Unmet:  Not Applicable  Comments: check out 1030   Dr. Jonathan Branch is Medical Director for [facility] 

## 2022-03-25 ENCOUNTER — Encounter (HOSPITAL_COMMUNITY)
Admission: RE | Admit: 2022-03-25 | Discharge: 2022-03-25 | Disposition: A | Discharge status: Home / Self Care | Payer: PPO | Source: Ambulatory Visit | Attending: Cardiology | Admitting: Cardiology

## 2022-03-25 DIAGNOSIS — I214 Non-ST elevation (NSTEMI) myocardial infarction: Secondary | ICD-10-CM | POA: Diagnosis not present

## 2022-03-25 DIAGNOSIS — Z955 Presence of coronary angioplasty implant and graft: Secondary | ICD-10-CM | POA: Insufficient documentation

## 2022-03-25 NOTE — Progress Notes (Signed)
Daily Session Note  Patient Details  Name: Randy Castro. MRN: [id number] Date of Birth: 08-Feb-1951 Referring Provider:   Flowsheet Row CARDIAC REHAB PHASE II ORIENTATION from 01/06/2022 in [place]  Referring Provider Dr. Louanne Belton       Encounter Date: 03/25/2022  Check In:  Session Check In - 03/25/22 0930       Check-In   Supervising physician immediately available to respond to emergencies [facility] MD immediately available    Physician(s) Dr. Johney Frame    Location [facility]    Staff Present Geanie Cooley, RN;Dalton Kris Mouton, MS, ACSM-CEP, Exercise Physiologist;Daphyne Hassell Done, RN, BSN;Heather Otho Ket, BS, Exercise Physiologist    Virtual Visit No    Medication changes reported     No    Fall or balance concerns reported    Yes    Comments He has fallen twice due to dizziness and vertigo. He also has diabetic neuropathy which alters his balance as well.    Tobacco Cessation No Change    Warm-up and Cool-down Performed as group-led instruction    Resistance Training Performed Yes    VAD Patient? No    PAD/SET Patient? No      Pain Assessment   Currently in Pain? No/denies    Pain Score 0-No pain    Multiple Pain Sites No             Capillary Blood Glucose: No results found for this or any previous visit (from the past 24 hour(s)).    Social History   Tobacco Use  Smoking Status Former   Packs/day: 1.00   Years: 25.00   Total pack years: 25.00   Types: Cigarettes   Quit date: 04/12/1992   Years since quitting: 29.9  Smokeless Tobacco Never    Goals Met:  Independence with exercise equipment Exercise tolerated well No report of concerns or symptoms today Strength training completed today  Goals Unmet:  Not Applicable  Comments: check out @ 10:30am   Dr. Carlyle Dolly is Medical Director for [facility]

## 2022-03-27 ENCOUNTER — Encounter (HOSPITAL_COMMUNITY)
Admission: RE | Admit: 2022-03-27 | Discharge: 2022-03-27 | Disposition: A | Discharge status: Home / Self Care | Payer: PPO | Source: Ambulatory Visit | Attending: Cardiology | Admitting: Cardiology

## 2022-03-27 DIAGNOSIS — Z955 Presence of coronary angioplasty implant and graft: Secondary | ICD-10-CM | POA: Diagnosis not present

## 2022-03-27 DIAGNOSIS — I214 Non-ST elevation (NSTEMI) myocardial infarction: Secondary | ICD-10-CM

## 2022-03-27 NOTE — Progress Notes (Signed)
Daily Session Note  Patient Details  Name: Randy Castro. MRN: [id number] Date of Birth: Oct 20, 1950 Referring Provider:   Flowsheet Row CARDIAC REHAB PHASE II ORIENTATION from 01/06/2022 in [place]  Referring Provider Dr. Louanne Belton       Encounter Date: 03/27/2022  Check In:  Session Check In - 03/27/22 0928       Check-In   Supervising physician immediately available to respond to emergencies [facility] MD immediately available    Physician(s) Dr Audie Box    Location [facility]    Staff Present Geanie Cooley, RN;Dalton Kris Mouton, MS, ACSM-CEP, Exercise Physiologist;Sangita Zani Hassell Done, RN, BSN;Heather Otho Ket, BS, Exercise Physiologist    Virtual Visit No    Medication changes reported     No    Fall or balance concerns reported    Yes    Comments He has fallen twice due to dizziness and vertigo. He also has diabetic neuropathy which alters his balance as well.    Tobacco Cessation No Change    Warm-up and Cool-down Performed as group-led instruction    Resistance Training Performed Yes    VAD Patient? No    PAD/SET Patient? No      Pain Assessment   Currently in Pain? No/denies    Pain Score 0-No pain    Multiple Pain Sites No             Capillary Blood Glucose: No results found for this or any previous visit (from the past 24 hour(s)).    Social History   Tobacco Use  Smoking Status Former   Packs/day: 1.00   Years: 25.00   Total pack years: 25.00   Types: Cigarettes   Quit date: 04/12/1992   Years since quitting: 29.9  Smokeless Tobacco Never    Goals Met:  Independence with exercise equipment Exercise tolerated well No report of concerns or symptoms today Strength training completed today  Goals Unmet:  Not Applicable  Comments: Checkout at 1030.   Dr. Carlyle Dolly is Medical Director for [facility]

## 2022-03-30 ENCOUNTER — Encounter (HOSPITAL_COMMUNITY)
Admission: RE | Admit: 2022-03-30 | Discharge: 2022-03-30 | Disposition: A | Discharge status: Home / Self Care | Payer: PPO | Source: Ambulatory Visit | Attending: Cardiology | Admitting: Cardiology

## 2022-03-30 DIAGNOSIS — Z955 Presence of coronary angioplasty implant and graft: Secondary | ICD-10-CM | POA: Diagnosis not present

## 2022-03-30 DIAGNOSIS — I214 Non-ST elevation (NSTEMI) myocardial infarction: Secondary | ICD-10-CM

## 2022-03-30 NOTE — Progress Notes (Signed)
Daily Session Note  Patient Details  Name: Randy Castro. MRN: [id number] Date of Birth: 21-Jun-1951 Referring Provider:   Flowsheet Row CARDIAC REHAB PHASE II ORIENTATION from 01/06/2022 in [place]  Referring Provider Dr. Louanne Belton       Encounter Date: 03/30/2022  Check In:  Session Check In - 03/30/22 0930       Check-In   Supervising physician immediately available to respond to emergencies [facility] MD immediately available    Physician(s) Dr. Debara Pickett    Location [facility]    Staff Present Geanie Cooley, RN;Heather Otho Ket, BS, Exercise Physiologist;Debra Wynetta Emery, RN, Bjorn Loser, MS, ACSM-CEP, Exercise Physiologist    Virtual Visit No    Medication changes reported     No    Fall or balance concerns reported    Yes    Comments He has fallen twice due to dizziness and vertigo. He also has diabetic neuropathy which alters his balance as well.    Tobacco Cessation No Change    Warm-up and Cool-down Performed as group-led instruction    Resistance Training Performed Yes    VAD Patient? No    PAD/SET Patient? No      Pain Assessment   Currently in Pain? No/denies    Pain Score 0-No pain    Multiple Pain Sites No             Capillary Blood Glucose: No results found for this or any previous visit (from the past 24 hour(s)).    Social History   Tobacco Use  Smoking Status Former   Packs/day: 1.00   Years: 25.00   Total pack years: 25.00   Types: Cigarettes   Quit date: 04/12/1992   Years since quitting: 29.9  Smokeless Tobacco Never    Goals Met:  Independence with exercise equipment Exercise tolerated well No report of concerns or symptoms today Strength training completed today  Goals Unmet:  Not Applicable  Comments: check out @ 10:30   Dr. Carlyle Dolly is Medical Director for [place]

## 2022-04-01 ENCOUNTER — Encounter (HOSPITAL_COMMUNITY)
Admission: RE | Admit: 2022-04-01 | Discharge: 2022-04-01 | Disposition: A | Discharge status: Home / Self Care | Payer: PPO | Source: Ambulatory Visit | Attending: Cardiology | Admitting: Cardiology

## 2022-04-01 DIAGNOSIS — Z955 Presence of coronary angioplasty implant and graft: Secondary | ICD-10-CM

## 2022-04-01 DIAGNOSIS — I214 Non-ST elevation (NSTEMI) myocardial infarction: Secondary | ICD-10-CM

## 2022-04-01 NOTE — Progress Notes (Signed)
Daily Session Note  Patient Details  Name: Randy Castro. MRN: [id number] Date of Birth: 06-24-1951 Referring Provider:   Flowsheet Row CARDIAC REHAB PHASE II ORIENTATION from 01/06/2022 in [place]  Referring Provider Dr. Louanne Belton       Encounter Date: 04/01/2022  Check In:  Session Check In - 04/01/22 0930       Check-In   Supervising physician immediately available to respond to emergencies [facility] MD immediately available    Physician(s) Dr. Phineas Inches    Location [facility]    Staff Present Geanie Cooley, RN;Heather Otho Ket, BS, Exercise Physiologist;Debra Wynetta Emery, RN, Bjorn Loser, MS, ACSM-CEP, Exercise Physiologist;Daphyne Hassell Done, RN, BSN    Virtual Visit No    Medication changes reported     No    Fall or balance concerns reported    Yes    Comments He has fallen twice due to dizziness and vertigo. He also has diabetic neuropathy which alters his balance as well.    Tobacco Cessation No Change    Warm-up and Cool-down Performed as group-led instruction    Resistance Training Performed Yes    VAD Patient? No    PAD/SET Patient? No      Pain Assessment   Currently in Pain? No/denies    Pain Score 0-No pain    Multiple Pain Sites No             Capillary Blood Glucose: No results found for this or any previous visit (from the past 24 hour(s)).    Social History   Tobacco Use  Smoking Status Former   Packs/day: 1.00   Years: 25.00   Total pack years: 25.00   Types: Cigarettes   Quit date: 04/12/1992   Years since quitting: 29.9  Smokeless Tobacco Never    Goals Met:  Independence with exercise equipment Exercise tolerated well No report of concerns or symptoms today Strength training completed today  Goals Unmet:  Not Applicable  Comments: check out @ 10:30am   Dr. Carlyle Dolly is Medical Director for [place]

## 2022-04-03 ENCOUNTER — Encounter (HOSPITAL_COMMUNITY)
Admission: RE | Admit: 2022-04-03 | Discharge: 2022-04-03 | Disposition: A | Discharge status: Home / Self Care | Payer: PPO | Source: Ambulatory Visit | Attending: Cardiology | Admitting: Cardiology

## 2022-04-03 DIAGNOSIS — E1165 Type 2 diabetes mellitus with hyperglycemia: Secondary | ICD-10-CM | POA: Diagnosis not present

## 2022-04-03 DIAGNOSIS — Z955 Presence of coronary angioplasty implant and graft: Secondary | ICD-10-CM | POA: Diagnosis not present

## 2022-04-03 DIAGNOSIS — I214 Non-ST elevation (NSTEMI) myocardial infarction: Secondary | ICD-10-CM

## 2022-04-03 NOTE — Progress Notes (Signed)
Daily Session Note  Patient Details  Name: Randy Castro. MRN: [id number] Date of Birth: [birth year] Referring Provider:   Flowsheet Row CARDIAC REHAB PHASE II ORIENTATION from 01/06/2022 in [place]  Referring Provider Dr. Louanne Belton       Encounter Date: 04/03/2022  Check In:  Session Check In - 04/03/22 0930       Check-In   Supervising physician immediately available to respond to emergencies [facility] MD immediately available    Physician(s) Dr. Harl Bowie    Location [facility]    Staff Present Geanie Cooley, RN;Heather Otho Ket, BS, Exercise Physiologist;Debra Wynetta Emery, RN, Bjorn Loser, MS, ACSM-CEP, Exercise Physiologist    Virtual Visit No    Medication changes reported     No    Fall or balance concerns reported    Yes    Comments He has fallen twice due to dizziness and vertigo. He also has diabetic neuropathy which alters his balance as well.    Tobacco Cessation No Change    Warm-up and Cool-down Performed as group-led instruction    Resistance Training Performed Yes    VAD Patient? No    PAD/SET Patient? No      Pain Assessment   Currently in Pain? No/denies    Pain Score 0-No pain    Multiple Pain Sites No             Capillary Blood Glucose: No results found for this or any previous visit (from the past 24 hour(s)).    Social History   Tobacco Use  Smoking Status Former   Packs/day: 1.00   Years: 25.00   Total pack years: 25.00   Types: Cigarettes   Quit date: 04/12/1992   Years since quitting: 29.9  Smokeless Tobacco Never    Goals Met:  Independence with exercise equipment Exercise tolerated well No report of concerns or symptoms today Strength training completed today  Goals Unmet:  Not Applicable  Comments: checkout @ 10:30am   Dr. Carlyle Dolly is Medical Director for [place]

## 2022-04-03 NOTE — Progress Notes (Signed)
Discharge Progress Report  Patient Details  Name: Randy Castro. MRN: [id number] Date of Birth: 1951/01/02 Referring Provider:   Flowsheet Row CARDIAC REHAB PHASE II ORIENTATION from 01/06/2022 in [place]  Referring Provider Dr. Louanne Belton        Number of Visits: 31  Reason for Discharge:  Patient reached a stable level of exercise. Patient independent in their exercise. Patient has met program and personal goals.  Smoking History:  Social History   Tobacco Use  Smoking Status Former   Packs/day: 1.00   Years: 25.00   Total pack years: 25.00   Types: Cigarettes   Quit date: 04/12/1992   Years since quitting: 29.9  Smokeless Tobacco Never    Diagnosis:  Status post coronary artery stent placement  NSTEMI (non-ST elevated myocardial infarction) ([place])  ADL [facility]:   Initial Exercise Prescription:  Initial Exercise Prescription - 01/06/22 0800       Date of Initial Exercise RX and Referring Provider   Date 01/06/22    Referring Provider Dr. Louanne Belton    Expected Discharge Date 04/03/22      Treadmill   MPH 1    Grade 0    Minutes 17      Recumbant Elliptical   Level 1    RPM 40    Minutes 22      Prescription Details   Frequency (times per week) 3    Duration Progress to 30 minutes of continuous aerobic without signs/symptoms of physical distress      Intensity   THRR 40-80% of Max Heartrate 60-120    Ratings of Perceived Exertion 11-13      Resistance Training   Training Prescription Yes    Weight 4    Reps 10-15             Discharge Exercise Prescription (Final Exercise Prescription Changes):  Exercise Prescription Changes - 03/23/22 1000       Response to Exercise   Blood Pressure (Admit) 110/62    Blood Pressure (Exercise) 122/60    Blood Pressure (Exit) 120/60    Heart Rate (Admit) 57 bpm    Heart Rate (Exercise) 80 bpm    Heart Rate (Exit) 66 bpm    Rating of Perceived Exertion (Exercise) 12    Duration  Continue with 30 min of aerobic exercise without signs/symptoms of physical distress.    Intensity THRR unchanged      Progression   Progression Continue to progress workloads to maintain intensity without signs/symptoms of physical distress.      Resistance Training   Training Prescription Yes    Weight 5    Reps 10-15    Time 10 Minutes      Treadmill   MPH 2    Grade 4    Minutes 17    METs 3.63      Recumbant Elliptical   Level 6    RPM 54    Minutes 22    METs 3.5             Functional Capacity:  6 Minute Walk     Row Name 01/06/22 0834 04/03/22 1005       6 Minute Walk   Phase Initial Discharge    Distance 1000 feet 1200 feet    Distance Feet Change -- 200 ft    Walk Time 6 minutes 6 minutes    # of Rest Breaks 0 0    MPH 1.89 2.27  METS 2.18 2.73    RPE 13 12    VO2 Peak 63 9.55    Symptoms Yes (comment) No    Comments L knee pain 3/10 --    Resting HR 62 bpm 60 bpm    Resting BP 120/76 130/60    Resting Oxygen Saturation  98 % 97 %    Exercise Oxygen Saturation  during 6 min walk 98 % 97 %    Max Ex. HR 69 bpm 77 bpm    Max Ex. BP 130/70 150/60    2 Minute Post BP 120/66 132/60             Psychological, QOL, Others - Outcomes: PHQ 2/9:    04/03/2022    1:59 PM 01/06/2022    8:35 AM 11/27/2021    8:47 AM 10/21/2021   11:02 AM 07/23/2021    8:58 AM  Depression screen PHQ 2/9  Decreased Interest 1 0 0 1 0  Down, Depressed, Hopeless 0 0 0 0 0  PHQ - 2 Score 1 0 0 1 0  Altered sleeping 0 0  1   Tired, decreased energy _0 Change in appetite 0 1  1   Feeling bad or failure about yourself  0 0  0   Trouble concentrating 0 0  1   Moving slowly or fidgety/restless 0 0  0   Suicidal thoughts 0 0  0   PHQ-9 Score _1 Difficult doing work/chores Not difficult at all Somewhat difficult  Somewhat difficult     Quality of Life:  Quality of Life - 04/03/22 1006       Quality of Life   Select Quality of Life      Quality of  Life Scores   Health/Function Pre 25.2 %    Health/Function Post 26.8 %    Health/Function % Change 6.35 %    Socioeconomic Pre 28.29 %    Socioeconomic Post 30 %    Socioeconomic % Change  6.04 %    Psych/Spiritual Pre 30 %    Psych/Spiritual Post 30 %    Psych/Spiritual % Change 0 %    Family Pre 27.6 %    Family Post 30 %    Family % Change 8.7 %    GLOBAL Pre 27.18 %    GLOBAL Post 28.59 %    GLOBAL % Change 5.19 %             Personal Goals: Goals established at orientation with interventions provided to work toward goal.  Personal Goals and Risk Factors at Admission - 01/06/22 0843       Core Components/Risk Factors/Patient Goals on Admission   Diabetes Yes    Intervention Provide education about signs/symptoms and action to take for hypo/hyperglycemia.;Provide education about proper nutrition, including hydration, and aerobic/resistive exercise prescription along with prescribed medications to achieve blood glucose in normal ranges: Fasting glucose 65-99 mg/dL    Expected Outcomes Short Term: Participant verbalizes understanding of the signs/symptoms and immediate care of hyper/hypoglycemia, proper foot care and importance of medication, aerobic/resistive exercise and nutrition plan for blood glucose control.;Long Term: Attainment of HbA1C < 7%.    Hypertension Yes    Intervention Provide education on lifestyle modifcations including regular physical activity/exercise, weight management, moderate sodium restriction and increased consumption of fresh fruit, vegetables, and low fat dairy, alcohol moderation, and smoking cessation.;Monitor prescription use compliance.    Expected Outcomes Short Term: Continued  assessment and intervention until BP is < 140/59m HG in hypertensive participants. < 130/835mHG in hypertensive participants with diabetes, heart failure or chronic kidney disease.;Long Term: Maintenance of blood pressure at goal levels.    Lipids Yes    Intervention  Provide education and support for participant on nutrition & aerobic/resistive exercise along with prescribed medications to achieve LDL <7025mHDL >66m59m  Expected Outcomes Short Term: Participant states understanding of desired cholesterol values and is compliant with medications prescribed. Participant is following exercise prescription and nutrition guidelines.;Long Term: Cholesterol controlled with medications as prescribed, with individualized exercise RX and with personalized nutrition plan. Value goals: LDL < 70mg23mL > 40 mg.    Personal Goal Other Yes    Personal Goal Improve strength and stamina    Intervention Attend CR three days per week and exercise at home 2-4 days per week.    Expected Outcomes He will report improvements in his strength and stamina. He will be able to improve his six minute walk test distance at discharge.              Personal Goals Discharge:  Goals and Risk Factor Review     Row Name 01/12/22 0714 02/02/22 0721 03/02/22 0728 04/03/22 1401       Core Components/Risk Factors/Patient Goals Review   Personal Goals Review Diabetes;Lipids;Hypertension;Other;Weight Management/Obesity Diabetes;Lipids;Hypertension;Other;Weight Management/Obesity Diabetes;Lipids;Hypertension;Other;Weight Management/Obesity Diabetes;Lipids;Hypertension;Other;Weight Management/Obesity    Review Patient was referred to CR with NSTEMI/DES. He has mutiple risk factors for CAD and is participating in the program for risk modification. He plans to start today. His DM is managed with insulin, metformin, and farxiga. His personal goals for the program are to improve his strength and stamina. We will continue to monior his progress as he works towards meeting these goals. Patient has completed 9 sessions with his current weight 209.1 lbs losing 5 lbs since last 30 day review. He is doing well in the program with consistent attendance and progressions. Last A1C on file was 5/22 at 8.4%. He is  on Lantus and Metformin for DM control. His reported home readings have been labile. His blood pressure is at goal. His personal goals for the program continue to be to improve his strength and stamina. We will continue to monitor his progress as he works towards meeting these personal goals. Patient has completed 18 sessions with his current weight 207.9 lbs losing 2 lbs since last 30 day review. He continues to do well in the program with consistent attendance and progressions. Last A1C on file was 11/22 at 9.7% trending up. He is on Lantus, Farxiga and Metformin for DM control. His blood pressure is at goal with a few high readings. His personal goals for the program continue to be to improve his strength and stamina. He says he is feeling stronger with more energy. We will continue to monitor his progress as he works towards meeting these personal goals. Pt graduated from CR after 31 sessions. His weight was stable while in the programs and his vitals were WNLs. His attendance was consistent and he was able to progress his workloads while he was in the program. He was able to improve his strength and stamina as evidenced by his 20% increase in his walk test distance and his 5.8 kg improvement in his grip strength.    Expected Outcomes Patient will complete the program meeting both personal and program goals. Patient will complete the program meeting both personal and program goals. Patient will complete the  program meeting both personal and program goals. Pt will continue to work towards his personal goals post discharge.             Exercise Goals and Review:  Exercise Goals     Row Name 01/12/22 1142 02/09/22 1042 03/09/22 1036         Exercise Goals   Increase Physical Activity Yes Yes Yes     Intervention Provide advice, education, support and counseling about physical activity/exercise needs.;Develop an individualized exercise prescription for aerobic and resistive training based on  initial evaluation findings, risk stratification, comorbidities and participant's personal goals. Provide advice, education, support and counseling about physical activity/exercise needs.;Develop an individualized exercise prescription for aerobic and resistive training based on initial evaluation findings, risk stratification, comorbidities and participant's personal goals. Provide advice, education, support and counseling about physical activity/exercise needs.;Develop an individualized exercise prescription for aerobic and resistive training based on initial evaluation findings, risk stratification, comorbidities and participant's personal goals.     Expected Outcomes Short Term: Attend rehab on a regular basis to increase amount of physical activity.;Long Term: Add in home exercise to make exercise part of routine and to increase amount of physical activity.;Long Term: Exercising regularly at least 3-5 days a week. Short Term: Attend rehab on a regular basis to increase amount of physical activity.;Long Term: Add in home exercise to make exercise part of routine and to increase amount of physical activity.;Long Term: Exercising regularly at least 3-5 days a week. Short Term: Attend rehab on a regular basis to increase amount of physical activity.;Long Term: Add in home exercise to make exercise part of routine and to increase amount of physical activity.;Long Term: Exercising regularly at least 3-5 days a week.     Increase Strength and Stamina Yes Yes Yes     Intervention Provide advice, education, support and counseling about physical activity/exercise needs.;Develop an individualized exercise prescription for aerobic and resistive training based on initial evaluation findings, risk stratification, comorbidities and participant's personal goals. Provide advice, education, support and counseling about physical activity/exercise needs.;Develop an individualized exercise prescription for aerobic and resistive  training based on initial evaluation findings, risk stratification, comorbidities and participant's personal goals. Provide advice, education, support and counseling about physical activity/exercise needs.;Develop an individualized exercise prescription for aerobic and resistive training based on initial evaluation findings, risk stratification, comorbidities and participant's personal goals.     Expected Outcomes Short Term: Increase workloads from initial exercise prescription for resistance, speed, and METs.;Short Term: Perform resistance training exercises routinely during rehab and add in resistance training at home;Long Term: Improve cardiorespiratory fitness, muscular endurance and strength as measured by increased METs and functional capacity (6MWT) Short Term: Increase workloads from initial exercise prescription for resistance, speed, and METs.;Short Term: Perform resistance training exercises routinely during rehab and add in resistance training at home;Long Term: Improve cardiorespiratory fitness, muscular endurance and strength as measured by increased METs and functional capacity (6MWT) Short Term: Increase workloads from initial exercise prescription for resistance, speed, and METs.;Short Term: Perform resistance training exercises routinely during rehab and add in resistance training at home;Long Term: Improve cardiorespiratory fitness, muscular endurance and strength as measured by increased METs and functional capacity (6MWT)     Able to understand and use rate of perceived exertion (RPE) scale Yes Yes Yes     Intervention Provide education and explanation on how to use RPE scale Provide education and explanation on how to use RPE scale Provide education and explanation on how to use RPE scale  Expected Outcomes Short Term: Able to use RPE daily in rehab to express subjective intensity level;Long Term:  Able to use RPE to guide intensity level when exercising independently Short Term: Able  to use RPE daily in rehab to express subjective intensity level;Long Term:  Able to use RPE to guide intensity level when exercising independently Short Term: Able to use RPE daily in rehab to express subjective intensity level;Long Term:  Able to use RPE to guide intensity level when exercising independently     Knowledge and understanding of Target Heart Rate Range (THRR) Yes Yes Yes     Intervention Provide education and explanation of THRR including how the numbers were predicted and where they are located for reference Provide education and explanation of THRR including how the numbers were predicted and where they are located for reference Provide education and explanation of THRR including how the numbers were predicted and where they are located for reference     Expected Outcomes Short Term: Able to state/look up THRR;Long Term: Able to use THRR to govern intensity when exercising independently;Short Term: Able to use daily as guideline for intensity in rehab Short Term: Able to state/look up THRR;Long Term: Able to use THRR to govern intensity when exercising independently;Short Term: Able to use daily as guideline for intensity in rehab Short Term: Able to state/look up THRR;Long Term: Able to use THRR to govern intensity when exercising independently;Short Term: Able to use daily as guideline for intensity in rehab     Able to check pulse independently Yes Yes Yes     Intervention Provide education and demonstration on how to check pulse in carotid and radial arteries.;Review the importance of being able to check your own pulse for safety during independent exercise Provide education and demonstration on how to check pulse in carotid and radial arteries.;Review the importance of being able to check your own pulse for safety during independent exercise Provide education and demonstration on how to check pulse in carotid and radial arteries.;Review the importance of being able to check your own pulse  for safety during independent exercise     Expected Outcomes Short Term: Able to explain why pulse checking is important during independent exercise;Long Term: Able to check pulse independently and accurately Short Term: Able to explain why pulse checking is important during independent exercise;Long Term: Able to check pulse independently and accurately Short Term: Able to explain why pulse checking is important during independent exercise;Long Term: Able to check pulse independently and accurately     Understanding of Exercise Prescription Yes Yes Yes     Intervention Provide education, explanation, and written materials on patient's individual exercise prescription Provide education, explanation, and written materials on patient's individual exercise prescription Provide education, explanation, and written materials on patient's individual exercise prescription     Expected Outcomes Short Term: Able to explain program exercise prescription;Long Term: Able to explain home exercise prescription to exercise independently Short Term: Able to explain program exercise prescription;Long Term: Able to explain home exercise prescription to exercise independently Short Term: Able to explain program exercise prescription;Long Term: Able to explain home exercise prescription to exercise independently              Exercise Goals Re-Evaluation:  Exercise Goals Re-Evaluation     Row Name 01/12/22 1151 02/09/22 1042 03/09/22 1033         Exercise Goal Re-Evaluation   Exercise Goals Review Increase Physical Activity;Increase Strength and Stamina;Able to understand and use rate of perceived exertion (  RPE) scale;Knowledge and understanding of Target Heart Rate Range (THRR);Able to check pulse independently;Understanding of Exercise Prescription Increase Physical Activity;Increase Strength and Stamina;Able to understand and use rate of perceived exertion (RPE) scale;Knowledge and understanding of Target Heart  Rate Range (THRR);Able to check pulse independently;Understanding of Exercise Prescription Increase Physical Activity;Increase Strength and Stamina;Able to understand and use rate of perceived exertion (RPE) scale;Knowledge and understanding of Target Heart Rate Range (THRR);Able to check pulse independently;Understanding of Exercise Prescription     Comments Pt has completed his first session of cardiac rehab. He is motviated amd ready to improve his strength. He is currently exercising at 2.3 METs on the ellp. Will continue to monitor and progress as able. Pt has completed 12 sessions of PR. He has been able to progress his workloads on both exercise stations and is motivated to continue to progress in the program. He is currently exercising at 3.4 METs on the seated elliptical. Will continue to monitor and progress as able. Pt has completed 21 sessions of cardiac rehab. He is verymotivated during class and increase his workload. He is walking on his day outside of class. He is currently exercising at 3.8 METs on the ellp. Will continue to montior and progress as able.     Expected Outcomes Through exercise at home and at rehab, the patient will meet their stated goals. Through exercise at home and at rehab, the patient will meet their stated goals. Through exercise at home and at rehab, the patient will meet their stated goals.              Nutrition & Weight - Outcomes:    Nutrition:  Nutrition Therapy & Goals - 01/12/22 0911       Personal Nutrition Goals   Comments Patient scored 61 on his diet assessment. We offer 2 educational sessions on heart healthy nutrition with handouts.      Intervention Plan   Intervention Nutrition handout(s) given to patient.    Expected Outcomes Short Term Goal: Understand basic principles of dietary content, such as calories, fat, sodium, cholesterol and nutrients.             Nutrition Discharge:  Nutrition Assessments - 04/03/22 1358        MEDFICTS Scores   Pre Score 61    Post Score 21    Score Difference -40             Education Questionnaire Score:  Knowledge Questionnaire Score - 04/03/22 1357       Knowledge Questionnaire Score   Pre Score 20/24    Post Score 19/24             Goals reviewed with patient; copy given to patient. Pt graduated from CR after 31 sessions. He was able to improve his walk test distance by 20%, and his MET level at graduation was 4.1. He plans on joining a gym post discharge to continue his exercise.

## 2022-04-23 ENCOUNTER — Ambulatory Visit (HOSPITAL_COMMUNITY)
Admission: RE | Admit: 2022-04-23 | Discharge: 2022-04-23 | Disposition: A | Discharge status: Home / Self Care | Payer: PPO | Source: Ambulatory Visit | Attending: Family | Admitting: Family

## 2022-04-23 DIAGNOSIS — I5022 Chronic systolic (congestive) heart failure: Secondary | ICD-10-CM | POA: Insufficient documentation

## 2022-04-23 LAB — ECHOCARDIOGRAM COMPLETE
AR max vel: 2.75 cm2
AV Area VTI: 2.66 cm2
AV Area mean vel: 2.58 cm2
AV Mean grad: 3 mmHg
AV Peak grad: 6 mmHg
Ao pk vel: 1.22 m/s
Area-P 1/2: 3.3 cm2
Calc EF: 49.3 %
MV VTI: 2.27 cm2
S' Lateral: 4.4 cm
Single Plane A2C EF: 55 %
Single Plane A4C EF: 47.5 %

## 2022-04-23 NOTE — Progress Notes (Signed)
*  PRELIMINARY RESULTS* Echocardiogram 2D Echocardiogram has been performed.  Elpidio Anis 04/23/2022, 11:37 AM

## 2022-04-23 NOTE — Progress Notes (Signed)
Virtual Visit Via Video       Consent was obtained for video visit:  Yes.   Answered questions that patient had about telehealth interaction:  Yes.   I discussed the limitations, risks, security and privacy concerns of performing an evaluation and management service by telemedicine. I also discussed with the patient that there may be a patient responsible charge related to this service. The patient expressed understanding and agreed to proceed.  Pt location: Home Physician Location: office Name of referring provider:  Colon Branch, MD I connected with Randy Castro. at patients initiation/request on 04/28/2022 at 10:45 AM EDT by video enabled telemedicine application and verified that I am speaking with the correct person using two identifiers. Pt MRN:  [id number] Pt DOB:  1951-05-26 Video Participants:  Randy Castro.;    Assessment/Plan:    1.  Essential Tremor  -increase primidone, 50 mg, 4 tablets in the morning, 2 tablets in the evening for 1 week and then increase to 4 tablets in the morning, 3 tablets in the evening  -Has developed just a little bit of resting tremor on the right, likely due to longstanding essential tremor.  We will be able to better evaluate this when I see him in person.  2.  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  3.  B12 deficiency  -Now on B12 supplementation.  4.  F/u 4-6 months   Subjective:   Randy Castro. was seen today in follow up for essential tremor.  My previous records were reviewed prior to todays visit. Pt with wife who supplements hx. patient continues on primidone, 3 tablets in the morning and 2 tablets at night.  Essential tremor has been doing fairly well with this medication but he has noted worsening with time - "I shake quite a bit now."  More trouble with woodworking when doing fine motor detail.    Last visit,  we discussed taking B12 and he reports that he is faithfully taking that, without any difficulty.  Medical records are reviewed since last visit.  Patient in the hospital in April with NSTEMI and he is currently in cardiac rehab.  Current prescribed movement disorder medications: Primidone, 50 mg, 3 in the morning, 2 at night B12  ALLERGIES:   Allergies  Allergen Reactions   Trulicity [Dulaglutide] Nausea And Vomiting    CURRENT MEDICATIONS:  Outpatient Encounter Medications as of 04/28/2022  Medication Sig   aspirin EC 81 MG tablet Take 81 mg by mouth daily.   atorvastatin (LIPITOR) 80 MG tablet Take 1 tablet (80 mg total) by mouth at bedtime.   Blood Glucose Monitoring Suppl (ONE TOUCH ULTRA 2) w/Device KIT by Does not apply route.   carvedilol (COREG) 12.5 MG tablet Take 1 tablet (12.5 mg total) by mouth 2 (two) times daily with a meal.   clopidogrel (PLAVIX) 75 MG tablet Take 1 tablet (75 mg total) by mouth daily.   Continuous Blood Gluc Sensor (FREESTYLE LIBRE 2 SENSOR) MISC 1 Device by Does not apply route every 14 (fourteen) days.   dapagliflozin propanediol (FARXIGA) 10 MG TABS tablet Take 1 tablet (10 mg total) by mouth daily.   FEROSUL 325 (65 Fe) MG tablet TAKE 1 TABLET BY MOUTH DAILY   FLUoxetine (PROZAC) 40 MG capsule Take 1 capsule (40 mg total) by mouth daily.   glucose blood (ONETOUCH ULTRA) test strip Check blood  sugar 2-3 times daily.  Dx Code: E11.9   insulin glargine (LANTUS SOLOSTAR) 100 UNIT/ML Solostar Pen Inject 20 Units into the skin daily. (Patient taking differently: Inject 30 Units into the skin daily.)   magnesium oxide (MAG-OX) 400 MG tablet Take 1 tablet (400 mg total) by mouth 2 (two) times daily.   metFORMIN (GLUCOPHAGE) 1000 MG tablet Take 1 tablet (1,000 mg total) by mouth 2 (two) times daily with a meal.   Multiple Vitamins-Minerals (MULTIVITAMIN WITH MINERALS) tablet Take 1 tablet by mouth daily.   nitroGLYCERIN (NITROSTAT) 0.4 MG SL tablet Place 1  tablet (0.4 mg total) under the tongue every 5 (five) minutes as needed for chest pain.   pantoprazole (PROTONIX) 40 MG tablet Take 1 tablet (40 mg total) by mouth 2 (two) times daily.   Potassium 99 MG TABS Take 99 mg by mouth daily.   sacubitril-valsartan (ENTRESTO) 24-26 MG Take 1 tablet by mouth 2 (two) times daily.   spironolactone (ALDACTONE) 25 MG tablet Take 0.5 tablets (12.5 mg total) by mouth daily.   vitamin B-12 (CYANOCOBALAMIN) 1000 MCG tablet Take 1,000 mcg by mouth daily.   [DISCONTINUED] primidone (MYSOLINE) 50 MG tablet TAKE THREE TABLETS BY MOUTH EVERY MORNING and TAKE TWO TABLETS BY MOUTH EVERYDAY AT BEDTIME (Patient taking differently: Take 100-150 mg by mouth See admin instructions. TAKE THREE TABLETS BY MOUTH EVERY MORNING and TAKE TWO TABLETS BY MOUTH EVERYDAY AT BEDTIME)   primidone (MYSOLINE) 50 MG tablet 4 in the AM, 3 at night   No facility-administered encounter medications on file as of 04/28/2022.     Objective:    PHYSICAL EXAMINATION:    VITALS:   Vitals:   04/28/22 0901  Weight: 212 lb (96.2 kg)  Height: 6' 2"  (1.88 m)      GEN:  The patient appears stated age and is in NAD. HEENT:  Normocephalic, atraumatic.    Neurological examination:  Orientation: The patient is alert and oriented x3. Cranial nerves: There is good facial symmetry. The speech is fluent and clear. Soft palate rises symmetrically and there is no tongue deviation. Hearing is intact to conversational tone. Sensation: unable Motor: Strength is at least antigravity x4.  Movement examination: Tone: Unable Abnormal movements: There was some rest tremor seen in the right thumb today on video.  He has moderate postural tremor on the right. Coordination:  There is no decremation with RAM's,  Gait and Station: Not tested today I have reviewed and interpreted the following labs independently   Lab Results  Component Value Date   TSH 1.11 04/17/2021     Chemistry      Component  Value Date/Time   NA 141 01/20/2022 1056   K 4.4 01/20/2022 1056   CL 103 01/20/2022 1056   CO2 22 01/20/2022 1056   BUN 9 01/20/2022 1056   CREATININE 0.84 01/20/2022 1056   CREATININE 1.20 06/15/2016 0849      Component Value Date/Time   CALCIUM 8.9 01/20/2022 1056   ALKPHOS 53 12/07/2021 0040   AST 30 12/07/2021 0040   ALT 32 12/07/2021 0040   BILITOT 0.6 12/07/2021 0040       Lab Results  Component Value Date   HGBA1C 7.8 (A) 11/18/2021    Lab Results  Component Value Date   VITAMINB12 559 10/21/2021    Follow up Instructions      -I discussed the assessment and treatment plan with the patient. The patient was provided an opportunity to ask questions and all were  answered. The patient agreed with the plan and demonstrated an understanding of the instructions.   The patient was advised to call back or seek an in-person evaluation if the symptoms worsen or if the condition fails to improve as anticipated.    Alonza Bogus, DO   Cc:  Colon Branch, MD

## 2022-04-24 ENCOUNTER — Telehealth (HOSPITAL_BASED_OUTPATIENT_CLINIC_OR_DEPARTMENT_OTHER): Payer: Self-pay

## 2022-04-24 ENCOUNTER — Other Ambulatory Visit (HOSPITAL_BASED_OUTPATIENT_CLINIC_OR_DEPARTMENT_OTHER): Payer: PPO

## 2022-04-24 DIAGNOSIS — I77810 Thoracic aortic ectasia: Secondary | ICD-10-CM

## 2022-04-24 NOTE — Telephone Encounter (Addendum)
Results called to patient who verbalizes understanding! Echo ordered.     ----- Message from Loel Dubonnet, NP sent at 04/24/2022 12:58 PM EDT ----- Echocardiogram with mildly reduced heart pumping function.  Slightly improved from previous (previous 35-40% now 40-45%).  Moderate thickening of the heart muscle.  Heart mildly stiff.  RV normal size and function.  Mild leaking of the mitral valve.  Mild dilation aortic root 43 mm.  Continue current medications.  Follow-up with Dr. Domenic Polite as scheduled.  Repeat echo in 1 year for monitoring.

## 2022-04-28 ENCOUNTER — Telehealth (INDEPENDENT_AMBULATORY_CARE_PROVIDER_SITE_OTHER): Payer: PPO | Admitting: Neurology

## 2022-04-28 ENCOUNTER — Encounter: Payer: Self-pay | Admitting: Neurology

## 2022-04-28 DIAGNOSIS — G250 Essential tremor: Secondary | ICD-10-CM | POA: Diagnosis not present

## 2022-04-28 MED ORDER — PRIMIDONE 50 MG PO TABS: ORAL_TABLET | ORAL | 5 refills | Status: DC

## 2022-04-28 NOTE — Patient Instructions (Signed)
It was good to see you today!  As we discussed, you will increase primidone, 50 mg, 4 tablets in the morning, 2 tablets in the evening for 1 week and then increase to 4 tablets in the morning, 3 tablets in the evening.  This has been [facility] to [facility]!  Take care,  Alonza Bogus, DO Director of [facility]

## 2022-05-05 ENCOUNTER — Ambulatory Visit: Payer: PPO | Admitting: Pharmacist

## 2022-05-05 DIAGNOSIS — E7800 Pure hypercholesterolemia, unspecified: Secondary | ICD-10-CM

## 2022-05-05 DIAGNOSIS — I5022 Chronic systolic (congestive) heart failure: Secondary | ICD-10-CM

## 2022-05-05 DIAGNOSIS — Z794 Long term (current) use of insulin: Secondary | ICD-10-CM

## 2022-05-05 DIAGNOSIS — I10 Essential (primary) hypertension: Secondary | ICD-10-CM

## 2022-05-05 NOTE — Patient Instructions (Signed)
Randy Castro It was a pleasure speaking with you today.  Below is a summary of your health goals and summary of our recent visit. You can also view your updated Chronic Care Management Care plan through your MyChart account.   Patient Goals/Self-Care Activities Over the next 90 days, patient will:  take medications as prescribed Adherence strategies to try Place morning and evening medications by toothbrush to remember to take Drink a glass of water every morning and evening and place medications by sink in kitchen to improve adherence Set an alarm / reminder on your phone for morning and evening medication doses.  check glucose using Continuous Glucose Monitor prior to meals and at other times during the day when needed. Will review Continuous Glucose Monitor reports during appointments in future. Remember to bring reader / cell phone to office visits so our office can review Continuous Glucose Monitor reports.  check blood pressure 1 to 2 times per week, document, and provide at future appointments engage in dietary modifications by decreasing intake of high sugar / CHO snacks and limiting serving sizes.   As always if you have any questions or concerns especially regarding medications, please feel free to contact me either at the phone number below or with a MyChart message.   Keep up the good work!  [name], PharmD Clinical Pharmacist [facility] [phone] (direct line)  [phone] (main office number)   Patient verbalizes understanding of instructions and care plan provided today and agrees to view in [facility]. Active MyChart status and patient understanding of how to access instructions and care plan via MyChart confirmed with patient.

## 2022-05-05 NOTE — Chronic Care Management (AMB) (Signed)
Chronic Care Management Pharmacy Note  03/02/2022 Name:  Randy Castro. MRN:  [id number] DOB:  05-03-1951  Summary:  Type 2 DM: A1c has been improving over the last 6 months. Last A1c was 7.8%. He is using Continuous Glucose Monitor to check blood glucose. We were also able to get [employer] to cover cost of Randy Castro because patient also has CHF / cardiomyopathy. Grant available thru 02/2023. Other diabetes medications are Lantus 30 units once a day and metformin 1076m twice a day. eGFR = 94 (01/20/2022) Patient denies blood glucose < 80 or > 250. He has appointment with endocrinologist 05/27/2022.  CHF: patient has been more compliant with EDelene Lollsince qualified for [employer] Cost is now $0. EF 04/23/2022 was 40-45% which was improved from 35-40%. Denied edema or shortness of breath.  Medication Management: dose of primidone was increased 04/28/2022 by Dr [name]. They have not received call from pharmacy or updated Rx delivered since change. Patient's wife endorses that they had extra primidone at home and they have enough to get thru with new dose until next adherence packaging is filled 05/15/2022. Verified with [facility] that they have new Rx for primidone 50g - take 4 tablets each morning and 2 each evening for 1 weeks, then take 4 tabls each morning and 3 tablets each evening.   Patient has completed cardiac rehab. [name], Encouraged increase frequency as able -plans to try to go 3 times per week over the next few months.   Subjective: [name] is an 71y.o. year old male who is a primary patient of Paz, JAlda Berthold MD.  The CCM team was consulted for assistance with disease management and care coordination needs.    Engaged with patient and his wife by telephone for  follow up and Continuous Glucose Monitor education  in response to provider referral for pharmacy case management and/or care coordination  services.   Consent to Services:  The patient was given information about Chronic Care Management services, agreed to services, and gave verbal consent prior to initiation of services.  Please see initial visit note for detailed documentation.   Patient Care Team: PColon Branch MD as PCP - General (Internal Medicine) MSatira Sark MD as PCP - Cardiology (Cardiology) MLarey Dresser MD as Consulting Physician (Cardiology) LLake Bells, MD as Referring Physician (Gastroenterology) MGearlean Alf PA-C as Physician Assistant (Pulmonary Disease) TDemetrius Revel MD as Consulting Physician (Bariatrics) [name], REustace Quail DO as Consulting Physician (Neurology) ECherre Robins RShaft(Pharmacist) CMadelin Headings DO (Optometry)  Recent office visits: 11/27/2021 - Int Med (Dr PLarose Kells annual physical exam. Labs checked. No med changes noted.  10/21/2021 - Int Med (Dr PLarose Kells [place] for acute visit - dizziness. Ordered MRI. MRI showed No acute intracranial process. No etiology is seen for the patient's dizziness. Referred to physical therapy for benign paroxysmal vertigo.  Recent consult visits: 04/28/2022 - Neuro (Dr [name]) F/U essential tremor. Primidone dose increased to 4 tablets each morning and 2 tablets each night for 1 week, then increase to 4 tablets each moring and 3 tablet each evening. Continued B12 supplement. F/U 4 to 6 months.  04/23/2022 - ECHO results. Echocardiogram with mildly reduced heart pumping function.  Slightly improved from previous (previous 35-40% now 40-45%).  Moderate thickening of the heart muscle.  Heart mildly stiff.  RV normal size and function.  Mild leaking of the mitral valve.  Mild dilation aortic root 43 mm.Continue current medications.  Follow-up with Dr. Domenic Polite as scheduled. Repeat ECHO in 1 year.  12/22/2021 - Cardio Gilford Rile, NP) F/U hospitalization. Increased farxiga to 340m daily. Labs checked. After normal CMP- spironolactone 12.531mdaily started.  11/20/2021 -  Cardio (Dr McDomenic PoliteF/U CAD. No med changes noted.  11/18/2021 - Endo (Dr ShKelton PillarF/U diabetes. A1c down form 9.2% to 7.8%. Changed from 70/30 mix insulin to Lantus 20 units daily. Continued metformin and Farxiga at current dosese. Plan to get [place]. F/U 6 months. 07/15/2021 - Endo (Dr ShKelton PillarF/U diabetes. A1c was 9.1. Started Farxiga 40m43maily before breakfast. BP also noted to be elevated.   [facility]05[facility]20[facility] 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  New medications stated at discharge: clopidogrel 740m33mily for 1 year. Entresto 24/26mg57mce a day Medications stopped at discharge: losartan 50mg 32mective:  Lab Results  Component Value Date   CREATININE 0.84 01/20/2022   CREATININE 0.67 (L) 12/22/2021   CREATININE 0.72 12/09/2021    Lab Results  Component Value Date   HGBA1C 7.8 (A) 11/18/2021   Last diabetic Eye exam:  Lab Results  Component Value Date/Time   HMDIABEYEEXA No Retinopathy 06/18/2021 12:00 AM    Last diabetic Foot exam:  Lab Results  Component Value Date/Time   HMDIABFOOTEX Done 03/19/2019 12:00 AM        Component Value Date/Time   CHOL 110 11/27/2021 0930   TRIG 121.0 11/27/2021 0930   HDL 45.40 11/27/2021 0930   CHOLHDL 2 11/27/2021 0930   VLDL 24.2 11/27/2021 0930   LDLCALC 40 11/27/2021 0930   LDLDIRECT 145.0 04/26/2018 1026       Latest Ref Rng & Units 12/07/2021   12:40 AM 12/06/2021    5:26 AM 07/23/2021    9:20 AM  Hepatic Function  Total Protein 6.5 - 8.1 g/dL 5.3  6.3  5.9   Albumin 3.5 - 5.0 g/dL 3.0  3.6  3.9   AST 15 - 41 U/L 30  43  26   ALT 0 - 44 U/L 32  42  37   Alk Phosphatase 38 - 126 U/L 53  61  60   Total Bilirubin 0.3 - 1.2 mg/dL 0.6  0.4  0.5   Bilirubin, Direct 0.0 - 0.3 mg/dL   0.1     Lab Results  Component Value Date/Time   TSH 1.11 04/17/2021 09:19 AM   TSH 0.92 04/26/2018 10:26 AM       Latest  Ref Rng & Units 12/22/2021   11:48 AM 12/09/2021    4:13 AM 12/08/2021    4:12 AM  CBC  WBC 3.4 - 10.8 x10E3/uL 7.9  6.1  8.0   Hemoglobin 13.0 - 17.7 g/dL 13.6  11.5  11.4   Hematocrit 37.5 - 51.0 % 41.7  34.3  34.6   Platelets 150 - 450 x10E3/uL 293  207  236     No results found for: "VD25OH"  Clinical ASCVD: Yes  The ASCVD Risk score (Arnett DK, et al., 2019) failed to calculate for the following reasons:   The patient has a prior MI or stroke diagnosis     Social History   Tobacco Use  Smoking Status Former   Packs/day: 1.00   Years: 25.00   Total pack years: 25.00   Types: Cigarettes   Quit date: 04/12/1992   Years since quitting: 30.0  Smokeless Tobacco Never   BP Readings from Last 3 Encounters:  01/06/22 120/76  12/22/21 136/80  12/09/21 (!) 158/71   Pulse Readings from Last 3 Encounters:  01/06/22 62  12/22/21 (!) 56  12/09/21 61   Wt Readings from Last 3 Encounters:  04/28/22 212 lb (96.2 kg)  03/23/22 211 lb 3.2 oz (95.8 kg)  03/09/22 209 lb 14.1 oz (95.2 kg)    Assessment: Review of patient past medical history, allergies, medications, health status, including review of consultants reports, laboratory and other test data, was performed as part of comprehensive evaluation and provision of chronic care management services.   SDOH:  (Social Determinants of Health) assessments and interventions performed:  SDOH Interventions    Flowsheet Row Chronic Care Management from 05/05/2022 in [place] [facility] from 04/03/2022 in [place] Management from 02/16/2022 in [name] at [place] from 01/06/2022 in [place] Office Visit from 10/21/2021 in [facility] at [place] Management from 08/29/2021 in [place] at [place] Interventions        Depression Interventions/Treatment  -- Currently on Treatment, Medication -- Medication, Currently on Treatment Counseling, Currently on Treatment --  Financial Strain Interventions -- -- Other (Comment)  [Patient is getting Farxiga from MAP,  assisting with completion of MAP application for Entresto today.] -- -- Other (Comment)  [agreeable to $47/month for Farxiga]  Physical Activity Interventions Other (Comments)  [patient and wife encouraged and planning in increase frequency of exerice over the next few months.] -- -- -- -- --         CCM Care Plan  Allergies  Allergen Reactions   Trulicity [Dulaglutide] Nausea And Vomiting    Medications Reviewed Today     Reviewed by [name], RPH-CPP (Pharmacist) on 05/05/22 at [place]  Med List Status: <None>   Medication Order Taking? Sig Documenting Provider Last Dose Status Informant  aspirin EC 81 MG tablet [id number] Yes Take 81 mg by mouth daily. [provider] Taking Active Spouse/Significant Other  atorvastatin (LIPITOR) 80 MG tablet [id number] Yes Take 1 tablet (80 mg total) by mouth at bedtime. Colon Branch, MD Taking Active   Blood Glucose Monitoring Suppl (ONE TOUCH ULTRA 2) w/Device KIT [id number] Yes by Does not apply route. [provider] Taking Active Spouse/Significant Other  carvedilol (COREG) 12.5 MG tablet [id number] Yes Take 1 tablet (12.5 mg total) by mouth 2 (two) times daily with a meal. Colon Branch, MD Taking Active   clopidogrel (PLAVIX) 75 MG tablet [id number] Yes Take 1 tablet (75 mg total) by mouth daily. Loel Dubonnet, NP Taking Active Spouse/Significant Other  Continuous Blood Gluc Sensor (FREESTYLE LIBRE 2 SENSOR) MISC [id number] Yes 1 Device by Does not apply route every 14 (fourteen) days. Shamleffer, Melanie Crazier, MD Taking Active Spouse/Significant Other  dapagliflozin propanediol (FARXIGA) 10 MG TABS tablet [id number] Yes Take 1 tablet (10 mg  total) by mouth daily. Loel Dubonnet, NP Taking Active Spouse/Significant Other  FEROSUL 325 (65 Fe) MG tablet [id number] Yes TAKE 1 TABLET BY MOUTH DAILY Cirigliano, Vito V, DO Taking Active Spouse/Significant Other  FLUoxetine (PROZAC) 40 MG capsule [id number] Yes Take 1 capsule (40 mg total) by mouth daily. Colon Branch, MD Taking Active Spouse/Significant Other  glucose blood (ONETOUCH ULTRA) test strip [id number] Yes  Check blood sugar 2-3 times daily.  Dx Code: E11.9 Colon Branch, MD Taking Active Spouse/Significant Other  insulin glargine (LANTUS SOLOSTAR) 100 UNIT/ML Solostar Pen [id number] Yes Inject 20 Units into the skin daily.  Patient taking differently: Inject 30 Units into the skin daily.   Shamleffer, Melanie Crazier, MD Taking Active Spouse/Significant Other  magnesium oxide (MAG-OX) 400 MG tablet [id number] Yes Take 1 tablet (400 mg total) by mouth 2 (two) times daily. Colon Branch, MD Taking Active   metFORMIN (GLUCOPHAGE) 1000 MG tablet [id number] Yes Take 1 tablet (1,000 mg total) by mouth 2 (two) times daily with a meal. Colon Branch, MD Taking Active   Multiple Vitamins-Minerals (MULTIVITAMIN WITH MINERALS) tablet [id number] Yes Take 1 tablet by mouth daily. [provider] Taking Active Spouse/Significant Other  nitroGLYCERIN (NITROSTAT) 0.4 MG SL tablet [id number] Yes Place 1 tablet (0.4 mg total) under the tongue every 5 (five) minutes as needed for chest pain. Loel Dubonnet, NP Taking Active Spouse/Significant Other  pantoprazole (PROTONIX) 40 MG tablet [id number] Yes Take 1 tablet (40 mg total) by mouth 2 (two) times daily. Colon Branch, MD Taking Active   Potassium 99 MG TABS [id number] Yes Take 99 mg by mouth daily. [provider] Taking Active Spouse/Significant Other  primidone (MYSOLINE) 50 MG tablet [id number] Yes 4 in the AM, 3 at night [name], [name], DO Taking Active   sacubitril-valsartan (ENTRESTO) 24-26 MG [id number] Yes Take 1 tablet by mouth 2 (two)  times daily. Colon Branch, MD Taking Active   spironolactone (ALDACTONE) 25 MG tablet [id number] Yes Take 0.5 tablets (12.5 mg total) by mouth daily. Loel Dubonnet, NP Taking Active Spouse/Significant Other  vitamin B-12 (CYANOCOBALAMIN) 1000 MCG tablet [id number] Yes Take 1,000 mcg by mouth daily. 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   Abdominal pain 11/17/2018   Cardiac arrhythmia, unspecified 11/14/2017   Hypomagnesemia 11/14/2017   Anemia 11/14/2017   Bleeding skin mole 11/14/2017   OSA on CPAP 09/21/2017   Obesity 01/23/2016   PCP NOTES >>> 06/08/2015   Anxiety and depression 05/31/2013   Intermediate coronary syndrome ([place]) 12/22/2012   Unstable angina ([place]) 12/16/2012   Fatigue 04/26/2012   Annual physical exam 10/28/2011   Rash 10/28/2011   Coronary Artery Disease 08/28/2011   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   Palpitations 08/28/2011   NSTEMI (non-ST elevated myocardial infarction) ([place]) 08/07/2011   DJD (degenerative joint disease) 05/28/2011   Poorly controlled type 2 diabetes mellitus with circulatory disorder ([place])    Hyperlipidemia    HTN (hypertension)     Immunization History  Administered Date(s) Administered   Fluad Quad(high Dose 65+) 05/04/2019   Influenza Split 06/30/2011   Influenza, High Dose Seasonal PF 05/31/2013, 05/27/2017, 06/07/2018   Influenza, Seasonal, Injecte, Preservative Fre  07/28/2012   Influenza,inj,Quad PF,6+ Mos 05/29/2014, 06/07/2015, 04/23/2016   Influenza-Unspecified 05/24/2020, 06/24/2021   Moderna Covid-19 Vaccine Bivalent Booster 28yr & up 08/29/2021  PFIZER(Purple Top)SARS-COV-2 Vaccination 10/11/2019, 11/01/2019, 05/20/2020   Pneumococcal Conjugate-13 12/05/2014   Pneumococcal Polysaccharide-23 08/07/2011, 10/12/2018   Tdap 05/31/2013   Zoster Recombinat (Shingrix) 04/17/2021, 06/24/2021   Zoster, Live 11/28/2012    Conditions to be addressed/monitored: CAD, HTN, HLD, DMII, Anxiety, Depression and B12 deficiency; iron def anemia; ;essential tremor; peripheral neuropathy; hypomagnesemia; h/o gastric bypass surgery; h/o lower GI bleed and GERD  Care Plan : General Pharmacy (Adult)  Updates made by [name], RPH-CPP since 05/05/2022 12:00 AM     Problem: Management of Chronic Conditions: Hypertension, Hyperlipidemia, Heart Disease, Diabetes, Depression, GERD/History of GI Bleed, Essential Tremor, Hypomagnesemia, neuropathy Resolved 05/05/2022  Priority: High  Onset Date: 11/25/2020  Note:   Current Barriers:  Unable to independently afford treatment regimen Unable to achieve control of type 2 DM and HTN   Pharmacist Clinical Goal(s):  Over the next 90 days, patient will verbalize ability to afford treatment regimen achieve adherence to monitoring guidelines and medication adherence to achieve therapeutic efficacy achieve control of diabetes as evidenced by A1c <8.0 and BP <140/90 adhere to prescribed medication regimen as evidenced by refill history  through collaboration with PharmD and provider.   Interventions: 1:1 collaboration with Colon Branch, MD regarding development and update of comprehensive plan of care as evidenced by provider attestation and co-signature Inter-disciplinary care team collaboration (see longitudinal plan of care) Comprehensive medication review performed; medication list updated in electronic medical  record  Diabetes: Controlled - A1c goal is < 8.0% Current treatment:  Lantus 20 units daily Metformin 1072m twice daily Farxiga 145mdaily (for blood glucose and heart)  Has tried Trulicity in past - caused nausea (also has h/o gastric bypass surgery and lower GI bleed). Tried jardiance and invokana - Jadiance stopped due to cost and Invokana was non formulary; Novoloin 70/30 - stopped due to hypoglycemia Current glucose readings: Lowest in last 2 weeks 80 (per patient) Highest 160 At beginning of 2023 Screened for [place] assistance program - Household income does not qualify (patient's wife still working) but cost will be $47 per month (at least until reaches coverage gap) and patient is [place] that cost. (I also had discussed Heart and Diabetes plan with [employer] that covered all diabetes meds at $0 but patient decided not continue with current plan)  Continue to use Freestyle Libre 2 -  sensors $0 with [employer] plan for 2023. Diet - reports he has been trying to avoid sweets and high carbohydrate foods like bread and potatoes.  Current exercise: walking some, renovating home, yardwork, working in his shop Interventions:  Reviewed home blood glucose readings and reviewed goals  Fasting blood glucose goal (before meals) = 80 to 130 Blood glucose goal after a meal = less than 180   Discussed how to use treatment indicators / arrows to determine when blood glucose is changing quickly and how to respond.  How to Treat a Low Glucose Level:  If you have a low blood glucose less than 70, please eat / drink 15 grams of carbohydrates (4 oz of juice, soda, 4 glucose tablets, or 3-4 pieces of hard candy).  It is best so choose a "quick" source of sugar that does no contain fat (chocolate and peanut butter might take longer to increase your blood glucose) Wait 15 minutes and then recheck your blood glucose. If your blood glucose is still less than 70, eat another 15 grams of carbohydrates.  Wait  another 15 minutes and recheck your glucose.  Continue this until  your blood glucose is over 70. Once you blood glucose is over 70, eat a snack with protein in it to prevent your blood glucose from dropping again.   Hypertension / CHF: Uncontrolled but improving. BP Readings from Last 3 Encounters:  01/06/22 120/76  12/22/21 136/80  12/09/21 (!) 158/71  Current treatment: Carvedilol 12.67m twice daily Entresto 24/251mtwice a day Spironolactone 2520m take 0.5 tablet = 12.5mg62mily Farxiga 10mg44mly (for blood glucose and heart)  Current home readings: not provided today Previous medications tried: losartan - therapy changed to [employer] assistance program - denied due to household income. Interventions:  Counseled on checking blood pressure  at home to get better idea of BP outside of office.  Recommended check weight daily. Contact office if weight increase by more than 3lbs in 24 hours of 5 lbs in 1 week.  Applied for [employer] for $1500 thru 02/2023  Hyperlipidemia / CAD: Controlled;  Current treatment:  Aspirin 81mg 12my Clopidogrel 75mg d78m (to continue for at least 1 year after recent stent 12/07/2021)  Atorvastatin 80mg da57m Nitroglycerin 0.4mg as n78med Interventions:  Recommended continue current therapy Discussed adherence to maintenance medications - adherence has improved since patient started monthly packaging.   Depression/Anxiety/: Uncontrolled but improved with change from citalopram to fluoxetine 04/17/2021 Current treatment: Fluoxetine 40mg - ta36m capsule daily      01/06/2022    8:35 AM 11/27/2021    8:47 AM 10/21/2021   11:02 AM  PHQ9 SCORE ONLY  PHQ-9 Total Score 3 0 6  Patient reports he has more energy and has more interests in hobbies and getting out since switch from citalopram to fluoxetine.  Interventions  Recommended continue current therapy  Neuropathy Controlled  Lab Results   Component Value Date   [id number]  Current regimen:  Over-the-counter vitamin B12 1000mcg dail9mnterventions:  Continue physical therapy / cardio rehab.    Hypomagnesemia Current regimen:  Magnesium oxide 400mg twice 26my Interventions: Continue to monitor magnesium level every 6 to 12 months.   Medication management Current pharmacy: [facility] Interventions Comprehensive medication review performed. Utilize  pharmacy for medication synchronization, packaging and delivery Reviewed refill history and assesses adherence. Discussed adherence with patient and his wife.  Provided application for Entresto patient assistance program at last visit. Has been returned and will fax today.   Patient Goals/Self-Care Activities Over the next 90 days, patient will:  take medications as prescribed Check weight daily. Contact office if weight increase by more than 3lbs in 24 hours of 5 lbs in 1 week; or if you experience  signs and symptoms of heart failure - weight gain, shortness of breath, abdominal fullness, swelling in legs or abdomen, Fatigue and weakness, changes in ability to perform usual activities, persistent cough or wheezing with white or pink blood-tinged mucus, nausea and lack of appetite check glucose with Continuous Glucose Monitor. Contact office if any blood glucose reading < 80 or > 200. check blood pressure 1 to 2 times per week, document, and provide at future appointments engage in dietary modifications by decreasing intake of high sugar / CHO snacks and limiting serving sizes  Follow Up Plan: Patient decline intervention from Nurse Care Management and does not need intervention from Social worker at this time. He and his wife would like to continue assistance with diabetes control and medication management from Clinical Pharmacist Practitioner. Un-enrolled from Chronic Care Management. Care Coordination phone follow up  planned for 08/2022. Sees  PCP 05/2022       Medication Assistance:  Wilder Glade obtained through [facility] and Me medication assistance program.  Enrollment ends 08/23/2022; Applied for [employer] patient assistance program but patient did not [employer] fast streamer for [employer] for cardiomegaly. Approved thru 02/2023.  Patient's preferred pharmacy is:  [facility] - [place], [place] - [address] [place] [address] Phone: [phone] Fax: [place], [place] [place] [address] Phone: [phone] Fax: [phone]   Uses pill box? No - using packaging from [facility] Adherence is about 98% - working with patient and his wife to increase adherence.    Follow Up:  Patient agrees to Care Plan and Follow-up.  Plan: : Patient declined intervention from Nurse Care Management 03/2022 and does not need intervention from Social worker at this time. He and his wife would like to continue assistance with diabetes control and medication management from Clinical Pharmacist Practitioner. Un-enrolled from Chronic Care Management. Care Coordination phone follow up planned for 08/2022. Sees PCP 05/2022  [name], PharmD Clinical Pharmacist [facility] [phone]

## 2022-05-12 ENCOUNTER — Encounter: Payer: Self-pay | Admitting: Internal Medicine

## 2022-05-14 ENCOUNTER — Ambulatory Visit: Payer: PPO | Admitting: Cardiology

## 2022-05-22 DIAGNOSIS — Z0289 Encounter for other administrative examinations: Secondary | ICD-10-CM | POA: Diagnosis not present

## 2022-05-26 NOTE — Progress Notes (Unsigned)
Name: Randy Castro.  Age/ Sex: 71 y.o., male   MRN/ DOB: [id number], 21-Apr-1951     PCP: Colon Branch, MD   Reason for Endocrinology Evaluation: Type 2 Diabetes Mellitus  Initial Endocrine Consultative Visit: 04/30/2020    PATIENT IDENTIFIER: Randy Castro. is a 71 y.o. male with a past medical history of T2DM, CAD, Dyslipidemia and HTN. The patient has followed with Endocrinology clinic since 04/30/2020 for consultative assistance with management of his diabetes.  DIABETIC HISTORY:  Randy Castro was diagnosed with DM in 1998 , he has been on Janumet,  Glimepiride and Trulicity with reported N/V with Trulicity.Was put on insulin in 2012 following admission for NSTEMI . His hemoglobin A1c has ranged from 7.3% in 2013, peaking at 13.4% in 2019   Pt was established with Dr. Cruzita Lederer from 2013 to 2014   On his initial visit to our clinic his A1c was 9.7 % . We continued Metformin and changed NPH insulin to insulin Mix    Was unable to get the dexcom 06/2021 Farxiga started 06/2021   Switch insulin mix to basal insulin 10/2021   SUBJECTIVE:   During the last visit (11/18/2021): A1c 7.8% . We increased insulin mix and continued metformin started [place]     Today (05/26/2022): Randy Castro is here for a follow up on diabetes.  He checks his blood sugars occasionally   . The patient has had hypoglycemic episodes since the last clinic visit, he did not bring his meter today.    He continues to follow-up with neurology (Dr. Carles Collet) for essential tremors 04/2022 He is undergoing cardiac rehab for an STEMI April 2023 Was seen by cardiology 05/12/2021 Saw Dr. Carles Collet ( neurology )04/2021 for essential tremors Had vertigo for 3 years, resolved recently through PT    Denies nausea, vomiting and diarrhea     HOME DIABETES REGIMEN:  Lantus 20 units daily  Metformin 1000 mg, 1 tablet with Breakfast and Supper Farxiga 5 mg daily    Statin: yes ACE-I/ARB: yes    METER DOWNLOAD SUMMARY:  Did not bring      DIABETIC COMPLICATIONS: Microvascular complications:  Neuropathy Denies: CKD, retinopathy Last Eye Exam: Completed 08/2021  Macrovascular complications:  CAD (S/P PCI )  Denies: CVA, PVD   HISTORY:  Past Medical History:  Past Medical History:  Diagnosis Date   Anxiety    Arthritis    BCC (basal cell carcinoma of skin) 12/2020   CAD (coronary artery disease)    a. NSTEMI 12/12: EF 40-45%. DUK:GURKYHC balloon PTCA + Promus DES x 1 to mid LAD;  b. 11/2012: Cath with Cutting balloon PTCA  LAD for ISR to LAD, EF 55%;  c. 12/2012 Cath/PCI: LAD stents patent, 80 ost Diag (jailed)->PTCA, LCX/RCA patent.   Essential hypertension    GI bleed    History of blood transfusion    was getting 4 units   Hyperlipidemia    Ischemic cardiomyopathy    a.  echo 08/06/11: dist ant wall, apical and septal and infero-apical HK, mild LVH, EF 40%, mild LAE, PASP 34, asc aorta mildly dilated, mild TR;  b.  Echo (3/13) showed recovery of LV systolic function with EF 62-37%, grade I diastolic dysfunction, mild MR.    Myocardial infarction [facility])    twice with NSTEMI 2012   Palpitations    Type 2 diabetes mellitus [facility])    Past Surgical History:  Past Surgical History:  Procedure Laterality Date  ANTERIOR CERVICAL DECOMP/DISCECTOMY FUSION  in the 90s   CHOLECYSTECTOMY  03/27/2019   COLONOSCOPY  2021   COLONOSCOPY WITH ESOPHAGOGASTRODUODENOSCOPY (EGD)  10/2019   CORONARY ANGIOPLASTY  12/15/2012; 12/22/2012   CORONARY ANGIOPLASTY WITH STENT PLACEMENT  07/2011   "1" (12/22/2012)   CORONARY STENT INTERVENTION N/A 12/08/2021   Procedure: CORONARY STENT INTERVENTION;  Surgeon: Early Osmond, MD;  Location: [place] CV [facility];  Service: Cardiovascular;  Laterality: N/A;   GASTRIC BYPASS  12/21/2017   KNEE ARTHROSCOPY Left 12/31/2016   LEFT HEART CATH AND CORONARY ANGIOGRAPHY N/A 12/08/2021   Procedure: LEFT HEART CATH AND CORONARY ANGIOGRAPHY;  Surgeon: Early Osmond, MD;  Location:  [place] CV [facility];  Service: Cardiovascular;  Laterality: N/A;   LEFT HEART CATHETERIZATION WITH CORONARY ANGIOGRAM N/A 08/06/2011   Procedure: LEFT HEART CATHETERIZATION WITH CORONARY ANGIOGRAM;  Surgeon: Burnell Blanks, MD;  Location: [facility] CATH LAB;  Service: Cardiovascular;  Laterality: N/A;   LEFT HEART CATHETERIZATION WITH CORONARY ANGIOGRAM N/A 12/15/2012   Procedure: LEFT HEART CATHETERIZATION WITH CORONARY ANGIOGRAM;  Surgeon: Sherren Mocha, MD;  Location: [facility] CATH LAB;  Service: Cardiovascular;  Laterality: N/A;   LEFT HEART CATHETERIZATION WITH CORONARY ANGIOGRAM N/A 12/22/2012   Procedure: LEFT HEART CATHETERIZATION WITH CORONARY ANGIOGRAM;  Surgeon: Sherren Mocha, MD;  Location: [facility] CATH LAB;  Service: Cardiovascular;  Laterality: N/A;   PERCUTANEOUS CORONARY STENT INTERVENTION (PCI-S) Right 12/15/2012   Procedure: PERCUTANEOUS CORONARY STENT INTERVENTION (PCI-S);  Surgeon: Sherren Mocha, MD;  Location: [facility] CATH LAB;  Service: Cardiovascular;  Laterality: Right;   Social History:  reports that he quit smoking about 30 years ago. His smoking use included cigarettes. He has a 25.00 pack-year smoking history. He has never used smokeless tobacco. He reports current alcohol use. He reports that he does not use drugs. Family History:  Family History  Problem Relation Age of Onset   Diabetes Brother    Lung cancer Brother    Diabetes Mother    Coronary artery disease Father    Heart attack Father 51   Diabetes Sister    Coronary artery disease Sister    Colon cancer Neg Hx    Prostate cancer Neg Hx    Stroke Neg Hx    Esophageal cancer Neg Hx    Colon polyps Neg Hx    Rectal cancer Neg Hx    Stomach cancer Neg Hx      HOME MEDICATIONS: Allergies as of 05/27/2022       Reactions   Trulicity [dulaglutide] Nausea And Vomiting        Medication List        Accurate as of May 26, 2022 11:08 AM. If you have any questions, ask your nurse or doctor.           aspirin EC 81 MG tablet Take 81 mg by mouth daily.   atorvastatin 80 MG tablet Commonly known as: LIPITOR Take 1 tablet (80 mg total) by mouth at bedtime.   carvedilol 12.5 MG tablet Commonly known as: COREG Take 1 tablet (12.5 mg total) by mouth 2 (two) times daily with a meal.   clopidogrel 75 MG tablet Commonly known as: PLAVIX Take 1 tablet (75 mg total) by mouth daily.   cyanocobalamin 1000 MCG tablet Commonly known as: VITAMIN B12 Take 1,000 mcg by mouth daily.   dapagliflozin propanediol 10 MG Tabs tablet Commonly known as: FARXIGA Take 1 tablet (10 mg total) by mouth daily.   FeroSul 325 (65 FE) MG tablet  Generic drug: ferrous sulfate TAKE 1 TABLET BY MOUTH DAILY   FLUoxetine 40 MG capsule Commonly known as: PROZAC Take 1 capsule (40 mg total) by mouth daily.   FreeStyle Libre 2 Sensor Misc 1 Device by Does not apply route every 14 (fourteen) days.   Lantus SoloStar 100 UNIT/ML Solostar Pen Generic drug: insulin glargine Inject 20 Units into the skin daily. What changed: how much to take   magnesium oxide 400 MG tablet Commonly known as: MAG-OX Take 1 tablet (400 mg total) by mouth 2 (two) times daily.   metFORMIN 1000 MG tablet Commonly known as: GLUCOPHAGE Take 1 tablet (1,000 mg total) by mouth 2 (two) times daily with a meal.   multivitamin with minerals tablet Take 1 tablet by mouth daily.   nitroGLYCERIN 0.4 MG SL tablet Commonly known as: NITROSTAT Place 1 tablet (0.4 mg total) under the tongue every 5 (five) minutes as needed for chest pain.   ONE TOUCH ULTRA 2 w/Device Kit by Does not apply route.   OneTouch Ultra test strip Generic drug: glucose blood Check blood sugar 2-3 times daily.  Dx Code: E11.9   pantoprazole 40 MG tablet Commonly known as: PROTONIX Take 1 tablet (40 mg total) by mouth 2 (two) times daily.   Potassium 99 MG Tabs Take 99 mg by mouth daily.   primidone 50 MG tablet Commonly known as: MYSOLINE 4 in the  AM, 3 at night   sacubitril-valsartan 24-26 MG Commonly known as: ENTRESTO Take 1 tablet by mouth 2 (two) times daily.   spironolactone 25 MG tablet Commonly known as: ALDACTONE Take 0.5 tablets (12.5 mg total) by mouth daily.         OBJECTIVE:   Vital Signs: There were no vitals taken for this visit.  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 The sensation is decreased to a screening 5.07, 10 gram monofilament bilaterally  DATA REVIEWED:  Lab Results  Component Value Date   HGBA1C 7.8 (A) 11/18/2021   HGBA1C 9.1 (A) 07/15/2021   HGBA1C 8.4 (A) 01/07/2021   Lab Results  Component Value Date   MICROALBUR 72.9 (H) 01/07/2021   LDLCALC 40 11/27/2021   CREATININE 0.84 01/20/2022   Lab Results  Component Value Date   MICRALBCREAT 67.3 (H) 01/07/2021     Lab Results  Component Value Date   CHOL 110 11/27/2021   HDL 45.40 11/27/2021   LDLCALC 40 11/27/2021   LDLDIRECT 145.0 04/26/2018   TRIG 121.0 11/27/2021   CHOLHDL 2 11/27/2021       Results for QUENTIN, SHOREY (MRN [id number]) as of 01/07/2021 14:17  Ref. Range 01/07/2021 09:36  Creatinine,U Latest Units: mg/dL 108.4  Microalb, Ur Latest Ref Range: 0.0 - 1.9 mg/dL 72.9 (H)  MICROALB/CREAT RATIO Latest Ref Range: 0.0 - 30.0 mg/g 67.3 (H)    In-Office BG  123 mg/dL  ASSESSMENT / PLAN / RECOMMENDATIONS:   1) Type 2 Diabetes Mellitus, Sub-optimally controlled, With neuropathic, and macrovascular   complications and microalbuminuria  - Most recent A1c of 7.8%. Goal A1c < 7.0 %.    - A1c down from 9.2% to 7.8 %  - Praised the pt on improved glycemic control  - He is not consistently taking his insulin mix correctly, at times he will take it post-prandial and today he took it without eating breakfast, discussed risk of hypoglycemia with taking it that way. I am going to stop the insulin mix and start him on basal insulin  -- Unable to obtain dexcom , will prescribe freestyle libre 2 - Will not increase Farxiga at this time due to dizziness, will consider increasing on next visit    MEDICATIONS: - Stop Insulin MIx  - Start Lantus 20 units daily  - Continue Metformin 1000 mg, 1 tablet with Breakfast and Supper - Continue  Farxiga 5 mg daily   EDUCATION / INSTRUCTIONS: BG monitoring instructions: Patient is instructed to check his blood sugars 2 times a day, before breakfast and Supper . Call [facility] if: BG persistently < 70 I reviewed the Rule of 15 for the treatment of hypoglycemia in detail with the patient. Literature supplied.     2) Diabetic complications:  Eye: Does not have known diabetic retinopathy.  Neuro/ Feet: Does  have known diabetic peripheral neuropathy  Renal: Patient does not have known baseline CKD, but has microalbuminuria. He is  on an ACEI/ARB at present.      F/U in 6 months    Signed electronically by: Mack Guise, MD  [facility]  [facility] [address] [place], [place] [address] Phone: [phone] FAX: [phone]   CC: Colon Branch, [address] [place] [address] Phone: [phone]  Fax: [phone]  Return to Endocrinology clinic as below: Future Appointments  Date Time Provider [place]  05/27/2022  9:30 AM Kee Drudge, Melanie Crazier, MD [facility] None  05/29/2022  9:20 AM Colon Branch, MD [facility]  06/10/2022 11:00 AM  [facility] HEALTH COACH [facility]  09/01/2022  8:30 AM [facility] PHARMACIST [facility]  09/02/2022  1:00 PM Charlie Pitter, PA-C [facility] [place] [facility]  09/29/2022 10:45 AM [name], [name], DO [facility] None

## 2022-05-27 ENCOUNTER — Encounter: Payer: Self-pay | Admitting: Internal Medicine

## 2022-05-27 ENCOUNTER — Encounter: Payer: Self-pay | Admitting: Student

## 2022-05-27 ENCOUNTER — Encounter: Payer: PPO | Attending: Cardiology | Admitting: Student

## 2022-05-27 ENCOUNTER — Ambulatory Visit: Payer: PPO | Admitting: Internal Medicine

## 2022-05-27 VITALS — BP 132/78 | HR 55 | Ht 74.0 in | Wt 204.4 lb

## 2022-05-27 VITALS — BP 122/68 | HR 65 | Ht 74.0 in | Wt 204.0 lb

## 2022-05-27 DIAGNOSIS — E1142 Type 2 diabetes mellitus with diabetic polyneuropathy: Secondary | ICD-10-CM

## 2022-05-27 DIAGNOSIS — I77810 Thoracic aortic ectasia: Secondary | ICD-10-CM | POA: Diagnosis not present

## 2022-05-27 DIAGNOSIS — E785 Hyperlipidemia, unspecified: Secondary | ICD-10-CM | POA: Diagnosis not present

## 2022-05-27 DIAGNOSIS — I255 Ischemic cardiomyopathy: Secondary | ICD-10-CM | POA: Insufficient documentation

## 2022-05-27 DIAGNOSIS — E1159 Type 2 diabetes mellitus with other circulatory complications: Secondary | ICD-10-CM

## 2022-05-27 DIAGNOSIS — Z794 Long term (current) use of insulin: Secondary | ICD-10-CM

## 2022-05-27 DIAGNOSIS — I5022 Chronic systolic (congestive) heart failure: Secondary | ICD-10-CM | POA: Insufficient documentation

## 2022-05-27 DIAGNOSIS — E119 Type 2 diabetes mellitus without complications: Secondary | ICD-10-CM | POA: Insufficient documentation

## 2022-05-27 DIAGNOSIS — E559 Vitamin D deficiency, unspecified: Secondary | ICD-10-CM

## 2022-05-27 DIAGNOSIS — G9331 Postviral fatigue syndrome: Secondary | ICD-10-CM

## 2022-05-27 DIAGNOSIS — I2510 Atherosclerotic heart disease of native coronary artery without angina pectoris: Secondary | ICD-10-CM | POA: Diagnosis not present

## 2022-05-27 DIAGNOSIS — Z79899 Other long term (current) drug therapy: Secondary | ICD-10-CM | POA: Insufficient documentation

## 2022-05-27 DIAGNOSIS — I10 Essential (primary) hypertension: Secondary | ICD-10-CM | POA: Diagnosis not present

## 2022-05-27 DIAGNOSIS — E1165 Type 2 diabetes mellitus with hyperglycemia: Secondary | ICD-10-CM | POA: Diagnosis not present

## 2022-05-27 LAB — POCT GLUCOSE (DEVICE FOR HOME USE): POC Glucose: 262 mg/dl — AB (ref 70–99)

## 2022-05-27 LAB — CBC
HCT: 36.9 % — ABNORMAL LOW (ref 39.0–52.0)
Hemoglobin: 12.3 g/dL — ABNORMAL LOW (ref 13.0–17.0)
MCHC: 33.4 g/dL (ref 30.0–36.0)
MCV: 85 fl (ref 78.0–100.0)
Platelets: 302 10*3/uL (ref 150.0–400.0)
RBC: 4.35 Mil/uL (ref 4.22–5.81)
RDW: 15 % (ref 11.5–15.5)
WBC: 7.4 10*3/uL (ref 4.0–10.5)

## 2022-05-27 LAB — BASIC METABOLIC PANEL
BUN: 13 mg/dL (ref 6–23)
CO2: 24 mEq/L (ref 19–32)
Calcium: 8.9 mg/dL (ref 8.4–10.5)
Chloride: 104 mEq/L (ref 96–112)
Creatinine, Ser: 0.91 mg/dL (ref 0.40–1.50)
GFR: 85.25 mL/min (ref >60.00)
Glucose, Bld: 236 mg/dL — ABNORMAL HIGH (ref 70–99)
Potassium: 3.3 mEq/L — ABNORMAL LOW (ref 3.5–5.1)
Sodium: 138 mEq/L (ref 135–145)

## 2022-05-27 LAB — VITAMIN D 25 HYDROXY (VIT D DEFICIENCY, FRACTURES): VITD: 18.29 ng/mL — ABNORMAL LOW (ref 30.00–100.00)

## 2022-05-27 LAB — POCT GLYCOSYLATED HEMOGLOBIN (HGB A1C): Hemoglobin A1C: 8.3 % — AB (ref 4.0–5.6)

## 2022-05-27 LAB — TSH: TSH: 1.61 u[IU]/mL (ref 0.35–5.50)

## 2022-05-27 LAB — T4, FREE: Free T4: 0.99 ng/dL (ref 0.60–1.60)

## 2022-05-27 MED ORDER — METFORMIN HCL 1000 MG PO TABS: 1000.0000 mg | ORAL_TABLET | Freq: Two times a day (BID) | ORAL | 3 refills | Status: DC

## 2022-05-27 MED ORDER — DAPAGLIFLOZIN PROPANEDIOL 10 MG PO TABS: 10.0000 mg | ORAL_TABLET | Freq: Every day | ORAL | 3 refills | Status: DC

## 2022-05-27 MED ORDER — SPIRONOLACTONE 25 MG PO TABS: 25.0000 mg | ORAL_TABLET | Freq: Every day | ORAL | 3 refills | Status: DC

## 2022-05-27 MED ORDER — INSULIN PEN NEEDLE 32G X 4 MM MISC: 1.0000 | Freq: Every day | 3 refills | Status: DC

## 2022-05-27 MED ORDER — LANTUS SOLOSTAR 100 UNIT/ML ~~LOC~~ SOPN: 36.0000 [IU] | PEN_INJECTOR | Freq: Every day | SUBCUTANEOUS | 3 refills | Status: DC

## 2022-05-27 NOTE — Progress Notes (Signed)
Cardiology Office Note    Date:  05/27/2022   ID:  Randy Castro., DOB 20-Nov-1950, MRN [id number]  PCP:  Colon Branch, MD  Cardiologist: Rozann Lesches, MD    Chief Complaint  Patient presents with   Follow-up    5 month visit    History of Present Illness:    Randy Castro. is a 71 y.o. male with past medical history of CAD (s/p NSTEMI in 2012 with DES to mid LAD, recurrent cath in 2014 with PTCA alone to ISR of LAD, cath in 12/2012 showing patent stents along LAD with jailed 80% ostial diagonal stenosis, s/p NSTEMI in 11/2021 with DES to D1), HFrEF (EF 40% in 2012, improved to 55-60% by 2013, at 35-40% in 11/2021), HTN, HLD, Type 2 DM, and OSA (on CPAP) who presents to the office today for 61-monthfollow-up.  He was last examined by Randy Castro in 12/2021 following his recent hospitalization for an NSTEMI during which he received a DES to the D1. He denied any recurrent anginal symptoms at the time of his office visit and was planning to participate in cardiac rehab. He was continued on Coreg, [place] plans for an updated echocardiogram in 3 months. This was obtained in 03/2022 and showed that his EF had improved to 40 to 45% with grade 1 diastolic dysfunction and mild MR. Was noted to have mild dilation of the aortic root measuring 43 mm.  In talking with the patient today, he reports overall feeling well from a cardiac perspective since his last office visit. He did have COVID-19 and the flu last month which caused a lot of respiratory issues but says this is now improving. Prior to his respiratory infections, he was exercising at a local gym and denies any anginal symptoms with this. No recent orthopnea, PND or pitting edema. He remains on ASA and Plavix with no reports of active bleeding.   Past Medical History:  Diagnosis Date   Anxiety    Arthritis    BCC (basal cell carcinoma of skin) 12/2020   CAD (coronary artery disease)    a. NSTEMI 12/12: EF  40-45%. PWHQ:PRFFMBWballoon PTCA + Promus DES x 1 to mid LAD;  b. 11/2012: Cath with [place] balloon PTCA  LAD for ISR to LAD, EF 55%;  c. 12/2012 Cath/PCI: LAD stents patent, 80 ost Diag (jailed)->PTCA, LCX/RCA patent. d. s/p NSTEMI in 11/2021 with DES to D1   Essential hypertension    GI bleed    History of blood transfusion    was getting 4 units   Hyperlipidemia    Ischemic cardiomyopathy    a.  echo 08/06/11: dist ant wall, apical and septal and infero-apical HK, mild LVH, EF 40%, mild LAE, PASP 34, asc aorta mildly dilated, mild TR;  b.  Echo (3/13) showed recovery of LV systolic function with EF 546-65% grade I diastolic dysfunction, mild MR.    Myocardial infarction ([facility]    twice with NSTEMI 2012   Palpitations    Type 2 diabetes mellitus ([facility]     Past Surgical History:  Procedure Laterality Date   ANTERIOR CERVICAL DECOMP/DISCECTOMY FUSION  in the 90s   CHOLECYSTECTOMY  03/27/2019   COLONOSCOPY  2021   COLONOSCOPY WITH ESOPHAGOGASTRODUODENOSCOPY (EGD)  10/2019   CORONARY ANGIOPLASTY  12/15/2012; 12/22/2012   CORONARY ANGIOPLASTY WITH STENT PLACEMENT  07/2011   "1" (12/22/2012)   CORONARY STENT INTERVENTION N/A 12/08/2021   Procedure: CORONARY STENT INTERVENTION;  Surgeon: Early Osmond, MD;  Location: [place] [facility];  Service: Cardiovascular;  Laterality: N/A;   GASTRIC BYPASS  12/21/2017   KNEE ARTHROSCOPY Left 12/31/2016   LEFT HEART CATH AND CORONARY ANGIOGRAPHY N/A 12/08/2021   Procedure: LEFT HEART CATH AND CORONARY ANGIOGRAPHY;  Surgeon: Early Osmond, MD;  Location: [place] [facility];  Service: Cardiovascular;  Laterality: N/A;   LEFT HEART CATHETERIZATION WITH CORONARY ANGIOGRAM N/A 08/06/2011   Procedure: LEFT HEART CATHETERIZATION WITH CORONARY ANGIOGRAM;  Surgeon: Burnell Blanks, MD;  Location: [facility] CATH LAB;  Service: Cardiovascular;  Laterality: N/A;   LEFT HEART CATHETERIZATION WITH CORONARY ANGIOGRAM N/A 12/15/2012   Procedure: LEFT HEART  CATHETERIZATION WITH CORONARY ANGIOGRAM;  Surgeon: Sherren Mocha, MD;  Location: [facility] CATH LAB;  Service: Cardiovascular;  Laterality: N/A;   LEFT HEART CATHETERIZATION WITH CORONARY ANGIOGRAM N/A 12/22/2012   Procedure: LEFT HEART CATHETERIZATION WITH CORONARY ANGIOGRAM;  Surgeon: Sherren Mocha, MD;  Location: [facility] CATH LAB;  Service: Cardiovascular;  Laterality: N/A;   PERCUTANEOUS CORONARY STENT INTERVENTION (PCI-S) Right 12/15/2012   Procedure: PERCUTANEOUS CORONARY STENT INTERVENTION (PCI-S);  Surgeon: Sherren Mocha, MD;  Location: [facility] CATH LAB;  Service: Cardiovascular;  Laterality: Right;    Current Medications: Outpatient Medications Prior to Visit  Medication Sig Dispense Refill   aspirin EC 81 MG tablet Take 81 mg by mouth daily.     atorvastatin (LIPITOR) 80 MG tablet Take 1 tablet (80 mg total) by mouth at bedtime. 90 tablet 1   Blood Glucose Monitoring Suppl (ONE TOUCH ULTRA 2) w/Device KIT by Does not apply route.     carvedilol (COREG) 12.5 MG tablet Take 1 tablet (12.5 mg total) by mouth 2 (two) times daily with a meal. 180 tablet 1   clopidogrel (PLAVIX) 75 MG tablet Take 1 tablet (75 mg total) by mouth daily. 90 tablet 3   Continuous Blood Gluc Sensor (FREESTYLE LIBRE 2 SENSOR) MISC 1 Device by Does not apply route every 14 (fourteen) days. 6 each 3   dapagliflozin propanediol (FARXIGA) 10 MG TABS tablet Take 1 tablet (10 mg total) by mouth daily. 90 tablet 3   FEROSUL 325 (65 Fe) MG tablet TAKE 1 TABLET BY MOUTH DAILY 30 tablet 3   FLUoxetine (PROZAC) 40 MG capsule Take 1 capsule (40 mg total) by mouth daily. 30 capsule 10   glucose blood (ONETOUCH ULTRA) test strip Check blood sugar 2-3 times daily.  Dx Code: E11.9 300 each 12   insulin glargine (LANTUS SOLOSTAR) 100 UNIT/ML Solostar Pen Inject 36 Units into the skin daily. 45 mL 3   Insulin Pen Needle 32G X 4 MM MISC 1 Device by Does not apply route daily in the afternoon. 100 each 3   magnesium oxide (MAG-OX) 400 MG tablet Take  1 tablet (400 mg total) by mouth 2 (two) times daily. 180 tablet 1   metFORMIN (GLUCOPHAGE) 1000 MG tablet Take 1 tablet (1,000 mg total) by mouth 2 (two) times daily with a meal. 180 tablet 3   Multiple Vitamins-Minerals (MULTIVITAMIN WITH MINERALS) tablet Take 1 tablet by mouth daily.     nitroGLYCERIN (NITROSTAT) 0.4 MG SL tablet Place 1 tablet (0.4 mg total) under the tongue every 5 (five) minutes as needed for chest pain. 25 tablet 3   pantoprazole (PROTONIX) 40 MG tablet Take 1 tablet (40 mg total) by mouth 2 (two) times daily. 180 tablet 1   Potassium 99 MG TABS Take 99 mg by mouth daily.     primidone (MYSOLINE)  50 MG tablet 4 in the AM, 3 at night 210 tablet 5   sacubitril-valsartan (ENTRESTO) 24-26 MG Take 1 tablet by mouth 2 (two) times daily. 180 tablet 1   vitamin B-12 (CYANOCOBALAMIN) 1000 MCG tablet Take 1,000 mcg by mouth daily.     spironolactone (ALDACTONE) 25 MG tablet Take 0.5 tablets (12.5 mg total) by mouth daily. 45 tablet 3   No facility-administered medications prior to visit.     Allergies:   Trulicity [dulaglutide]   Social History   Socioeconomic History   Marital status: Married    Spouse name: Not on file   Number of children: 2    Years of education: Not on file   Highest education level: Not on file  Occupational History   Occupation: retired--maintenance tech  Tobacco Use   Smoking status: Former    Packs/day: 1.00    Years: 25.00    Total pack years: 25.00    Types: Cigarettes    Quit date: 04/12/1992    Years since quitting: 30.1   Smokeless tobacco: Never  Vaping Use   Vaping Use: Never used  Substance and Sexual Activity   Alcohol use: Not Currently    Comment: 12/22/2012 "rarely maybe every 3-4 months, beer"   Drug use: No   Sexual activity: Yes  Other Topics Concern   Not on file  Social History Narrative   Moved back to [place] from [place]-- pt and wife   Hobby: Dietitian   Social Determinants of Health    Financial Resource Strain: Medium Risk (02/16/2022)   Overall Financial Resource Strain (CARDIA)    Difficulty of Paying Living Expenses: Somewhat hard  Food Insecurity: No Food Insecurity (06/02/2021)   Hunger Vital Sign    Worried About Running Out of Food in the Last Year: Never true    [place] in the Last Year: Never true  Transportation Needs: No Transportation Needs (05/26/2021)   PRAPARE - Hydrologist (Medical): No    Lack of Transportation (Non-Medical): No  Physical Activity: Insufficiently Active (05/05/2022)   Exercise Vital Sign    Days of Exercise per Week: 2 days    Minutes of Exercise per Session: 40 min  Stress: No Stress Concern Present (06/02/2021)   [place]    Feeling of Stress : Not at all  Social Connections: Moderately Integrated (06/02/2021)   Social Connection and Isolation Panel [NHANES]    Frequency of Communication with Friends and Family: More than three times a week    Frequency of Social Gatherings with Friends and Family: More than three times a week    Attends Religious Services: More than 4 times per year    Active Member of [employer] or Organizations: No    Attends Archivist Meetings: Never    Marital Status: Married     Family History:  The patient's family history includes Coronary artery disease in his father and sister; Diabetes in his brother, mother, and sister; Heart attack (age of onset: 68) in his father; Lung cancer in his brother.   Review of Systems:    Please see the history of present illness.     All other systems reviewed and are otherwise negative except as noted above.   Physical Exam:    VS:  BP 132/78   Pulse (!) 55   Ht _0  (1.88 m)   Wt 204 lb  6.4 oz (92.7 kg)   SpO2 96%   BMI 26.24 kg/m    General: Pleasant male appearing in no acute distress. Head: Normocephalic, atraumatic. Neck: No carotid  bruits. JVD not elevated.  Lungs: Respirations regular and unlabored, without wheezes or rales.  Heart: Regular rate and rhythm. No S3 or S4.  No murmur, no rubs, or gallops appreciated. Abdomen: Appears non-distended. No obvious abdominal masses. Msk:  Strength and tone appear normal for age. No obvious joint deformities or effusions. Extremities: No clubbing or cyanosis. No pitting edema.  Distal pedal pulses are 2+ bilaterally. Neuro: Alert and oriented X 3. Moves all extremities spontaneously. No focal deficits noted. Psych:  Responds to questions appropriately with a normal affect. Skin: No rashes or lesions noted  Wt Readings from Last 3 Encounters:  05/27/22 204 lb 6.4 oz (92.7 kg)  05/27/22 204 lb (92.5 kg)  04/28/22 212 lb (96.2 kg)     Studies/Labs Reviewed:   EKG:  EKG is not ordered today.   Recent Labs: 12/07/2021: ALT 32 12/22/2021: Magnesium 1.8 05/27/2022: BUN 13; Creatinine, Ser 0.91; Hemoglobin 12.3; Platelets 302.0; Potassium 3.3; Sodium 138; TSH 1.61   Lipid Panel    Component Value Date/Time   CHOL 110 11/27/2021 0930   TRIG 121.0 11/27/2021 0930   HDL 45.40 11/27/2021 0930   CHOLHDL 2 11/27/2021 0930   VLDL 24.2 11/27/2021 0930   LDLCALC 40 11/27/2021 0930   LDLDIRECT 145.0 04/26/2018 1026    Additional studies/ records that were reviewed today include:   LHC: 11/2021 Prox LAD lesion is 10% stenosed.   1st Diag lesion is 99% stenosed.   A stent was successfully placed.   Post intervention, there is a 0% residual stenosis.   LV end diastolic pressure is normal.   1.  High-grade restenosis of the ostium of jailed first diagonal treated with 1 drug-eluting stent with POT and kissing balloon inflation in a reverse [place] configuration. 2.  LVEDP of 7 mmHg.   Recommendation: Dual antiplatelet therapy for ideally 1 year and medical management of coronary artery disease.    Echocardiogram: 03/2022 IMPRESSIONS     1. Left ventricular ejection  fraction, by estimation, is 40 to 45%. The  left ventricle has mildly decreased function. The left ventricle  demonstrates global hypokinesis. There is moderate concentric left  ventricular hypertrophy. Left ventricular  diastolic parameters are consistent with Grade I diastolic dysfunction  (impaired relaxation).   2. Right ventricular systolic function is normal. The right ventricular  size is normal. There is normal pulmonary artery systolic pressure. The  estimated right ventricular systolic pressure is 68.3 mmHg.   3. Left atrial size was severely dilated.   4. The mitral valve is grossly normal. Mild mitral valve regurgitation.   5. The aortic valve is tricuspid. Aortic valve regurgitation is not  visualized. Aortic valve mean gradient measures 3.0 mmHg.   6. Aortic dilatation noted. There is mild dilatation of the aortic root,  measuring 43 mm.   7. The inferior vena cava is normal in size with greater than 50%  respiratory variability, suggesting right atrial pressure of 3 mmHg.   Comparison(s): Prior images reviewed side by side. LVEF has improved  somewhat in comparison.   Assessment:    1. Coronary artery disease involving native coronary artery of native heart without angina pectoris   2. Chronic systolic (congestive) heart failure ([place])   3. Ischemic cardiomyopathy   4. Essential hypertension   5. Hyperlipidemia LDL goal <70  6. Aortic root dilatation (HCC)   7. Medication management      Plan:   In order of problems listed above:  1. CAD - He is s/p NSTEMI in 2012 with DES to mid LAD, recurrent cath in 2014 with PTCA alone to ISR of LAD, cath in 12/2012 showing patent stents along LAD with jailed 80% ostial diagonal stenosis and most recent was an NSTEMI in 11/2021 with DES to D1.  - He remains active at baseline and denies any recent anginal symptoms. - Continue current medical therapy with ASA 81 mg daily, Plavix 75 mg daily, Atorvastatin 80 mg daily and Coreg  12.5 mg twice daily.  2. HFrEF  - His EF was at 35-40% in 11/2021, improved to 40-45% by recent imaging in 03/2022. He appears euvolemic on examination today and denies any recent orthopnea, PND or pitting edema. He is currently taking Coreg 12.5 mg twice daily, Farxiga 10 mg daily, Entresto 24-26 mg twice daily and Spironolactone 12.5 mg daily. Will plan to titrate Spironolactone to 25 mg daily. BMET earlier this morning showed his K+ was low at 3.3 and hopefully dose adjustment of Spironolactone will help with his hypokalemia as well. Will recheck a BMET in 3-4 weeks.   3. HTN - His blood pressure was initially recorded at 142/70, rechecked and at 132/78. Will continue Coreg 12.5 mg twice daily and Entresto 24-26 mg twice daily but titrate Spironolactone to 25 mg daily as outlined above.  4. HLD - FLP in 11/2021 showed total cholesterol 110, triglycerides 121, HDL 45 and LDL 40. Continue Atorvastatin 80 mg daily.  5. Aortic Root Dilation - His aortic root measured 43 mm by echo in 03/2022. Would plan for repeat imaging later next year.   Medication Adjustments/Labs and Tests Ordered: Current medicines are reviewed at length with the patient today.  Concerns regarding medicines are outlined above.  Medication changes, Labs and Tests ordered today are listed in the Patient Instructions below. Patient Instructions  Medication Instructions:   Increase Spironolactone to 25 mg Daily    *If you need a refill on your cardiac medications before your next appointment, please call your pharmacy*   Lab Work: Your physician recommends that you return for lab work in: 1 Month   If you have labs (blood work) drawn today and your tests are completely normal, you will receive your results only by: [place] (if you have MyChart) OR A paper copy in the mail If you have any lab test that is abnormal or we need to change your treatment, we will call you to review the  results.   Testing/Procedures: NONE    Follow-Up: At [facility], you and your health needs are our priority.  As part of our continuing mission to provide you with exceptional heart care, we have created designated Provider Care Teams.  These Care Teams include your primary Cardiologist (physician) and Advanced Practice Providers (APPs -  Physician Assistants and Nurse Practitioners) who all work together to provide you with the care you need, when you need it.  We recommend signing up for the patient portal called "MyChart".  Sign up information is provided on this After Visit Summary.  MyChart is used to connect with patients for Virtual Visits (Telemedicine).  Patients are able to view lab/test results, encounter notes, upcoming appointments, etc.  Non-urgent messages can be sent to your provider as well.   To learn more about what you can do with MyChart, go to [email or web address].  Your next appointment:   5 -6 month(s)  The format for your next appointment:   In Person  Provider:   You may see Rozann Lesches, MD or one of the following Advanced Practice Providers on your designated Care Team:   Bernerd Pho, PA-C  Ermalinda Barrios, PA-C     Other Instructions Thank you for choosing [facility]!    Important Information About Sugar         Signed, Erma Heritage, PA-C  05/27/2022 3:14 PM    [facility] [address] [place], [place] [address] Phone: [phone] Fax: [phone]

## 2022-05-27 NOTE — Patient Instructions (Signed)
-  Increase Lantus 36 units ONCE daily  - Continue Metformin 1000 mg, 1 tablet with Breakfast and Supper - Continue Farxiga 10 mg, 1 tablet every morning       HOW TO TREAT LOW BLOOD SUGARS (Blood sugar LESS THAN 70 MG/DL) Please follow the RULE OF 15 for the treatment of hypoglycemia treatment (when your (blood sugars are less than 70 mg/dL)   STEP 1: Take 15 grams of carbohydrates when your blood sugar is low, which includes:  3-4 GLUCOSE TABS  OR 3-4 OZ OF JUICE OR REGULAR SODA OR ONE TUBE OF GLUCOSE GEL    STEP 2: RECHECK blood sugar in 15 MINUTES STEP 3: If your blood sugar is still low at the 15 minute recheck --> then, go back to STEP 1 and treat AGAIN with another 15 grams of carbohydrates.

## 2022-05-27 NOTE — Patient Instructions (Signed)
Medication Instructions:   Increase Spironolactone to 25 mg Daily    *If you need a refill on your cardiac medications before your next appointment, please call your pharmacy*   Lab Work: Your physician recommends that you return for lab work in: 1 Month   If you have labs (blood work) drawn today and your tests are completely normal, you will receive your results only by: [place] (if you have MyChart) OR A paper copy in the mail If you have any lab test that is abnormal or we need to change your treatment, we will call you to review the results.   Testing/Procedures: NONE    Follow-Up: At [facility], you and your health needs are our priority.  As part of our continuing mission to provide you with exceptional heart care, we have created designated Provider Care Teams.  These Care Teams include your primary Cardiologist (physician) and Advanced Practice Providers (APPs -  Physician Assistants and Nurse Practitioners) who all work together to provide you with the care you need, when you need it.  We recommend signing up for the patient portal called "MyChart".  Sign up information is provided on this After Visit Summary.  MyChart is used to connect with patients for Virtual Visits (Telemedicine).  Patients are able to view lab/test results, encounter notes, upcoming appointments, etc.  Non-urgent messages can be sent to your provider as well.   To learn more about what you can do with MyChart, go to [email or web address].    Your next appointment:   5 -6 month(s)  The format for your next appointment:   In Person  Provider:   You may see Rozann Lesches, MD or one of the following Advanced Practice Providers on your designated Care Team:   Bernerd Pho, PA-C  Ermalinda Barrios, PA-C     Other Instructions Thank you for choosing [facility]!    Important Information About Sugar

## 2022-05-28 ENCOUNTER — Telehealth: Payer: Self-pay | Admitting: Internal Medicine

## 2022-05-28 MED ORDER — VITAMIN D (ERGOCALCIFEROL) 1.25 MG (50000 UNIT) PO CAPS: 50000.0000 [IU] | ORAL_CAPSULE | ORAL | 2 refills | Status: DC

## 2022-05-28 MED ORDER — POTASSIUM CHLORIDE CRYS ER 20 MEQ PO TBCR: 20.0000 meq | EXTENDED_RELEASE_TABLET | Freq: Every day | ORAL | 0 refills | Status: DC

## 2022-05-28 NOTE — Telephone Encounter (Signed)
Please let the patient know that his vitamins are low  Vitamin D is low-I prescribed ergocalciferol ONCE weekly for the next 6 months   Potassium is low-I have prescribed potassium 1 tablet daily for 1 month only    He now has anemia-he needs to start a multivitamin  I believe all this is due to his decreased appetite after the virus infection    Thanks

## 2022-05-29 ENCOUNTER — Ambulatory Visit (INDEPENDENT_AMBULATORY_CARE_PROVIDER_SITE_OTHER): Payer: PPO | Admitting: Internal Medicine

## 2022-05-29 ENCOUNTER — Encounter: Payer: Self-pay | Admitting: Internal Medicine

## 2022-05-29 VITALS — BP 136/76 | HR 48 | Temp 97.8°F | Resp 18 | Ht 74.0 in | Wt 206.5 lb

## 2022-05-29 DIAGNOSIS — E559 Vitamin D deficiency, unspecified: Secondary | ICD-10-CM

## 2022-05-29 DIAGNOSIS — I2510 Atherosclerotic heart disease of native coronary artery without angina pectoris: Secondary | ICD-10-CM | POA: Diagnosis not present

## 2022-05-29 DIAGNOSIS — Z23 Encounter for immunization: Secondary | ICD-10-CM

## 2022-05-29 DIAGNOSIS — E1140 Type 2 diabetes mellitus with diabetic neuropathy, unspecified: Secondary | ICD-10-CM

## 2022-05-29 DIAGNOSIS — E1165 Type 2 diabetes mellitus with hyperglycemia: Secondary | ICD-10-CM

## 2022-05-29 DIAGNOSIS — E538 Deficiency of other specified B group vitamins: Secondary | ICD-10-CM

## 2022-05-29 MED ORDER — GABAPENTIN 100 MG PO CAPS: 100.0000 mg | ORAL_CAPSULE | Freq: Three times a day (TID) | ORAL | 1 refills | Status: DC

## 2022-05-29 NOTE — Patient Instructions (Addendum)
For neuropathy:  Please read the information below about feet care  Start gabapentin 100 mg: The first few days take it only twice daily, once you get used to the medication take it 3 times a day Call if you have side effects or problems.   GO TO THE FRONT DESK, PLEASE SCHEDULE YOUR APPOINTMENTS Come back for   a checkup in 4 months    Per our records you are due for your diabetic eye exam. Please contact your eye doctor to schedule an appointment. Please have them send copies of your office visit notes to [place]. Our fax number is [phone]. If you need a referral to an eye doctor please let us know.   Diabetes Mellitus and Foot Care Foot care is an important part of your health, especially when you have diabetes. Diabetes may cause you to have problems because of poor blood flow (circulation) to your feet and legs, which can cause your skin to: Become thinner and drier. Break more easily. Heal more slowly. Peel and crack. You may also have nerve damage (neuropathy) in your legs and feet, causing decreased feeling in them. This means that you may not notice minor injuries to your feet that could lead to more serious problems. Noticing and addressing any potential problems early is the best way to prevent future foot problems. How to care for your feet Foot hygiene  Wash your feet daily with warm water and mild soap. Do not use hot water. Then, pat your feet and the areas between your toes until they are completely dry. Do not soak your feet as this can dry your skin. Trim your toenails straight across. Do not dig under them or around the cuticle. File the edges of your nails with an emery board or nail file. Apply a moisturizing lotion or petroleum jelly to the skin on your feet and to dry, brittle toenails. Use lotion that does not contain alcohol and is unscented. Do not apply lotion between your toes. Shoes and socks Wear clean socks or stockings every day. Make sure they are not  too tight. Do not wear knee-high stockings since they may decrease blood flow to your legs. Wear shoes that fit properly and have enough cushioning. Always look in your shoes before you put them on to be sure there are no objects inside. To break in new shoes, wear them for just a few hours a day. This prevents injuries on your feet. Wounds, scrapes, corns, and calluses  Check your feet daily for blisters, cuts, bruises, sores, and redness. If you cannot see the bottom of your feet, use a mirror or ask someone for help. Do not cut corns or calluses or try to remove them with medicine. If you find a minor scrape, cut, or break in the skin on your feet, keep it and the skin around it clean and dry. You may clean these areas with mild soap and water. Do not clean the area with peroxide, alcohol, or iodine. If you have a wound, scrape, corn, or callus on your foot, look at it several times a day to make sure it is healing and not infected. Check for: Redness, swelling, or pain. Fluid or blood. Warmth. Pus or a bad smell. General tips Do not cross your legs. This may decrease blood flow to your feet. Do not use heating pads or hot water bottles on your feet. They may burn your skin. If you have lost feeling in your feet or legs, you  may not know this is happening until it is too late. Protect your feet from hot and cold by wearing shoes, such as at the beach or on hot pavement. Schedule a complete foot exam at least once a year (annually) or more often if you have foot problems. Report any cuts, sores, or bruises to your health care provider immediately. Where to find more information American Diabetes Association: www.diabetes.org Association of Diabetes Care & Education Specialists: www.diabeteseducator.org Contact a health care provider if: You have a medical condition that increases your risk of infection and you have any cuts, sores, or bruises on your feet. You have an injury that is not  healing. You have redness on your legs or feet. You feel burning or tingling in your legs or feet. You have pain or cramps in your legs and feet. Your legs or feet are numb. Your feet always feel cold. You have pain around any toenails. Get help right away if: You have a wound, scrape, corn, or callus on your foot and: You have pain, swelling, or redness that gets worse. You have fluid or blood coming from the wound, scrape, corn, or callus. Your wound, scrape, corn, or callus feels warm to the touch. You have pus or a bad smell coming from the wound, scrape, corn, or callus. You have a fever. You have a red line going up your leg. Summary Check your feet every day for blisters, cuts, bruises, sores, and redness. Apply a moisturizing lotion or petroleum jelly to the skin on your feet and to dry, brittle toenails. Wear shoes that fit properly and have enough cushioning. If you have foot problems, report any cuts, sores, or bruises to your health care provider immediately. Schedule a complete foot exam at least once a year (annually) or more often if you have foot problems. This information is not intended to replace advice given to you by your health care provider. Make sure you discuss any questions you have with your health care provider. Document Revised: 02/29/2020 Document Reviewed: 02/29/2020 Elsevier Patient Education  [place].

## 2022-05-29 NOTE — Progress Notes (Signed)
Subjective:    Patient ID: Randy Foots., male    DOB: 20-Mar-1951, 71 y.o.   MRN: [id number]  DOS:  05/29/2022 Type of visit - description: f/u  Since LOV had a STEMI, currently doing well Also had COVID 05/12/2022, felt pretty poorly for several days, shortly after developed another viral syndrome. He is finally feeling better. Currently with no fever or chills.  No cough No nausea vomiting. No diarrhea or blood in the stools.  History of neuropathy, still an issue, sxs are not particularly worse at night, they are essentially 24/7.  Described as burning sensation and numbness.    Review of Systems See above   Past Medical History:  Diagnosis Date   Anxiety    Arthritis    BCC (basal cell carcinoma of skin) 12/2020   CAD (coronary artery disease)    a. NSTEMI 12/12: EF 40-45%. ZDG:LOVFIEP balloon PTCA + Promus DES x 1 to mid LAD;  b. 11/2012: Cath with Cutting balloon PTCA  LAD for ISR to LAD, EF 55%;  c. 12/2012 Cath/PCI: LAD stents patent, 80 ost Diag (jailed)->PTCA, LCX/RCA patent. d. s/p NSTEMI in 11/2021 with DES to D1   Essential hypertension    GI bleed    History of blood transfusion    was getting 4 units   Hyperlipidemia    Ischemic cardiomyopathy    a.  echo 08/06/11: dist ant wall, apical and septal and infero-apical HK, mild LVH, EF 40%, mild LAE, PASP 34, asc aorta mildly dilated, mild TR;  b.  Echo (3/13) showed recovery of LV systolic function with EF 32-95%, grade I diastolic dysfunction, mild MR.    Myocardial infarction [facility])    twice with NSTEMI 2012   Palpitations    Type 2 diabetes mellitus [facility])     Past Surgical History:  Procedure Laterality Date   ANTERIOR CERVICAL DECOMP/DISCECTOMY FUSION  in the 90s   CHOLECYSTECTOMY  03/27/2019   COLONOSCOPY  2021   COLONOSCOPY WITH ESOPHAGOGASTRODUODENOSCOPY (EGD)  10/2019   CORONARY ANGIOPLASTY  12/15/2012; 12/22/2012   CORONARY ANGIOPLASTY WITH STENT PLACEMENT  07/2011   "1" (12/22/2012)   CORONARY STENT  INTERVENTION N/A 12/08/2021   Procedure: CORONARY STENT INTERVENTION;  Surgeon: Early Osmond, MD;  Location: [place] CV LAB;  Service: Cardiovascular;  Laterality: N/A;   GASTRIC BYPASS  12/21/2017   KNEE ARTHROSCOPY Left 12/31/2016   LEFT HEART CATH AND CORONARY ANGIOGRAPHY N/A 12/08/2021   Procedure: LEFT HEART CATH AND CORONARY ANGIOGRAPHY;  Surgeon: Early Osmond, MD;  Location: [place] CV LAB;  Service: Cardiovascular;  Laterality: N/A;   LEFT HEART CATHETERIZATION WITH CORONARY ANGIOGRAM N/A 08/06/2011   Procedure: LEFT HEART CATHETERIZATION WITH CORONARY ANGIOGRAM;  Surgeon: Burnell Blanks, MD;  Location: [facility] CATH LAB;  Service: Cardiovascular;  Laterality: N/A;   LEFT HEART CATHETERIZATION WITH CORONARY ANGIOGRAM N/A 12/15/2012   Procedure: LEFT HEART CATHETERIZATION WITH CORONARY ANGIOGRAM;  Surgeon: Sherren Mocha, MD;  Location: [facility] CATH LAB;  Service: Cardiovascular;  Laterality: N/A;   LEFT HEART CATHETERIZATION WITH CORONARY ANGIOGRAM N/A 12/22/2012   Procedure: LEFT HEART CATHETERIZATION WITH CORONARY ANGIOGRAM;  Surgeon: Sherren Mocha, MD;  Location: [facility] CATH LAB;  Service: Cardiovascular;  Laterality: N/A;   PERCUTANEOUS CORONARY STENT INTERVENTION (PCI-S) Right 12/15/2012   Procedure: PERCUTANEOUS CORONARY STENT INTERVENTION (PCI-S);  Surgeon: Sherren Mocha, MD;  Location: [facility] CATH LAB;  Service: Cardiovascular;  Laterality: Right;    Current Outpatient Medications  Medication Instructions   aspirin EC  81 mg, Oral, Daily   atorvastatin (LIPITOR) 80 mg, Oral, Daily at bedtime   Blood Glucose Monitoring Suppl (ONE TOUCH ULTRA 2) w/Device KIT Does not apply   carvedilol (COREG) 12.5 mg, Oral, 2 times daily with meals   clopidogrel (PLAVIX) 75 mg, Oral, Daily   Continuous Blood Gluc Sensor (FREESTYLE LIBRE 2 SENSOR) MISC 1 Device, Does not apply, Every 14 days   cyanocobalamin (VITAMIN B12) 1,000 mcg, Oral, Daily   dapagliflozin propanediol (FARXIGA) 10 mg, Oral,  Daily   FEROSUL 325 (65 Fe) MG tablet TAKE 1 TABLET BY MOUTH DAILY   FLUoxetine (PROZAC) 40 mg, Oral, Daily   glucose blood (ONETOUCH ULTRA) test strip Check blood sugar 2-3 times daily.  Dx Code: E11.9   Insulin Pen Needle 32G X 4 MM MISC 1 Device, Does not apply, Daily   Lantus SoloStar 36 Units, Subcutaneous, Daily   magnesium oxide (MAG-OX) 400 mg, Oral, 2 times daily   metFORMIN (GLUCOPHAGE) 1,000 mg, Oral, 2 times daily with meals   Multiple Vitamins-Minerals (MULTIVITAMIN WITH MINERALS) tablet 1 tablet, Oral, Daily   nitroGLYCERIN (NITROSTAT) 0.4 mg, Sublingual, Every 5 min PRN   pantoprazole (PROTONIX) 40 mg, Oral, 2 times daily   potassium chloride SA (KLOR-CON M) 20 MEQ tablet 20 mEq, Oral, Daily   Potassium 99 mg, Oral, Daily   primidone (MYSOLINE) 50 MG tablet 4 in the AM, 3 at night   sacubitril-valsartan (ENTRESTO) 24-26 MG 1 tablet, Oral, 2 times daily   spironolactone (ALDACTONE) 25 mg, Oral, Daily   Vitamin D (Ergocalciferol) (DRISDOL) 50,000 Units, Oral, Every 7 days       Objective:   Physical Exam BP 136/76   Pulse (!) 48   Temp 97.8 F (36.6 C) (Oral)   Resp 18   Ht 6' 2" (1.88 m)   Wt 206 lb 8 oz (93.7 kg)   SpO2 96%   BMI 26.51 kg/m  General:   Well developed, NAD, BMI noted. HEENT:  Normocephalic . Face symmetric, atraumatic Lungs:  CTA B Normal respiratory effort, no intercostal retractions, no accessory muscle use. Heart: RRR,  no murmur.  DM foot exam: No edema, good pedal pulses, pinprick examination normal Skin: Not pale. Not jaundice Neurologic:  alert & oriented X3.  Speech normal, gait appropriate for age and unassisted Psych--  Cognition and judgment appear intact.  Cooperative with normal attention span and concentration.  Behavior appropriate. No anxious or depressed appearing.      Assessment     Assessment   DM : insulin dependent,  w/ CAD CHF. Intol Trulicity, per endo Neuropathy, severe, documented by NCS  2022 HTN Hyperlipidemia CAD -- NSTEMI in 07/2011; unstable angina in 11/2012 and 12/2012.NSTMI [id number] .  CHF, ischemic cardiomyopathy, PVCs (Holter 2019 show 13.6% burden) OSA dx ~08-2017, on Cpap Anxiety-depression Essential Tremor dx 2019  Skin cancer: Morbid obesity:gastric sleeve, 11-2017 Dr Raul Del 09-2019 GI bleed, gastric ulcer, required transfusion B12 deficiency Dx 2022 Vitamin D deficiency Dx 05-2022  PLAN CAD, CHF: Shortly after the last visit, had a non-STEMI, currently doing well, saw cardiology 2 days ago, they noted no anginal symptoms.  For heart failure, they recommended titrate up spironolactone, BMP to be done in few weeks. Aortic root dilatation: Per cardiology, next imaging 03-2023  DM: Saw Endo 2 days ago, A1c 8.3, higher than before. Neuropathy: Previously was not bothering him much, today he reports constant burning and some numbness.  On exam pinprick examination is normal but he has documented neuropathy. Plan:  Feet care discussed, see AVS, start gabapentin.  Reassess on RTC B12 deficiency: Encourage compliance with oral supplements, last level satisfactory Vitamin D deficiency: Recently diagnosed by Endo, on supplements Other issues: Patient declined intervention from Nurse Care Management and does not need intervention from Social worker at this time. He and his wife would like to continue assistance with diabetes control and medication management from Clinical Pharmacist Practitioner. COVID infection: Developed COVID infection last month, did not get any medication, currently feeling better. Preventive care: Flu shot today, declined COVID-vaccine. RTC 4 months   Time spent 34 minutes.  Extensive chart review, and a non-STEMI since the last visit. We also talk about neuropathy, feet care. Notes from current management reviewed, has declined their services.  Vaccine advice provided as well.

## 2022-05-29 NOTE — Telephone Encounter (Signed)
Patient advised and verbalized understanding 

## 2022-05-31 NOTE — Assessment & Plan Note (Signed)
CAD, CHF: Shortly after the last visit, had a non-STEMI, currently doing well, saw cardiology 2 days ago, they noted no anginal symptoms.  For heart failure, they recommended titrate up spironolactone, BMP to be done in few weeks. Aortic root dilatation: Per cardiology, next imaging 03-2023  DM: Saw Endo 2 days ago, A1c 8.3, higher than before. Neuropathy: Previously was not bothering him much, today he reports constant burning and some numbness.  On exam pinprick examination is normal but he has documented neuropathy. Plan: Feet care discussed, see AVS, start gabapentin.  Reassess on RTC B12 deficiency: Encourage compliance with oral supplements, last level satisfactory Vitamin D deficiency: Recently diagnosed by Endo, on supplements Other issues: Patient declined intervention from Nurse Care Management and does not need intervention from Social worker at this time. He and his wife would like to continue assistance with diabetes control and medication management from Clinical Pharmacist Practitioner. COVID infection: Developed COVID infection last month, did not get any medication, currently feeling better. Preventive care: Flu shot today, declined COVID-vaccine. RTC 4 months

## 2022-06-04 ENCOUNTER — Ambulatory Visit: Payer: PPO

## 2022-06-10 ENCOUNTER — Ambulatory Visit: Payer: PPO

## 2022-06-10 ENCOUNTER — Other Ambulatory Visit: Payer: Self-pay | Admitting: Internal Medicine

## 2022-06-10 ENCOUNTER — Ambulatory Visit (INDEPENDENT_AMBULATORY_CARE_PROVIDER_SITE_OTHER): Payer: PPO | Admitting: *Deleted

## 2022-06-10 DIAGNOSIS — Z0000 Encounter for general adult medical examination without abnormal findings: Secondary | ICD-10-CM | POA: Diagnosis not present

## 2022-06-10 NOTE — Patient Instructions (Signed)
[name] , Thank you for taking time to come for your Medicare Wellness Visit. I appreciate your ongoing commitment to your health goals. Please review the following plan we discussed and let me know if I can assist you in the future.   These are the goals we discussed:  Goals      Increase physical activity     Exercise daily         This is a list of the screening recommended for you and due dates:  Health Maintenance  Topic Date Due   Yearly kidney health urinalysis for diabetes  05/29/2027*   Eye exam for diabetics  06/18/2022   Hemoglobin A1C  11/26/2022   Colon Cancer Screening  12/26/2022   Yearly kidney function blood test for diabetes  05/28/2023   Complete foot exam   05/30/2023   Tetanus Vaccine  06/01/2023   Pneumonia Vaccine  Completed   Flu Shot  Completed   Hepatitis C Screening: USPSTF Recommendation to screen - Ages 18-79 yo.  Completed   Zoster (Shingles) Vaccine  Completed   HPV Vaccine  Aged Out   COVID-19 Vaccine  Discontinued   Cologuard (Stool DNA test)  Discontinued  *Topic was postponed. The date shown is not the original due date.     Next appointment: Follow up in one year for your annual wellness visit.   Preventive Care 66 Years and Older, Male Preventive care refers to lifestyle choices and visits with your health care provider that can promote health and wellness. What does preventive care include? A yearly physical exam. This is also called an annual well check. Dental exams once or twice a year. Routine eye exams. Ask your health care provider how often you should have your eyes checked. Personal lifestyle choices, including: Daily care of your teeth and gums. Regular physical activity. Eating a healthy diet. Avoiding tobacco and drug use. Limiting alcohol use. Practicing safe sex. Taking low doses of aspirin every day. Taking vitamin and mineral supplements as recommended by your health care provider. What happens during an annual  well check? The services and screenings done by your health care provider during your annual well check will depend on your age, overall health, lifestyle risk factors, and family history of disease. Counseling  Your health care provider may ask you questions about your: Alcohol use. Tobacco use. Drug use. Emotional well-being. Home and relationship well-being. Sexual activity. Eating habits. History of falls. Memory and ability to understand (cognition). Work and work Statistician. Screening  You may have the following tests or measurements: Height, weight, and BMI. Blood pressure. Lipid and cholesterol levels. These may be checked every 5 years, or more frequently if you are over 62 years old. Skin check. Lung cancer screening. You may have this screening every year starting at age 5 if you have a 30-pack-year history of smoking and currently smoke or have quit within the past 15 years. Fecal occult blood test (FOBT) of the stool. You may have this test every year starting at age 85. Flexible sigmoidoscopy or colonoscopy. You may have a sigmoidoscopy every 5 years or a colonoscopy every 10 years starting at age 40. Prostate cancer screening. Recommendations will vary depending on your family history and other risks. Hepatitis C blood test. Hepatitis B blood test. Sexually transmitted disease (STD) testing. Diabetes screening. This is done by checking your blood sugar (glucose) after you have not eaten for a while (fasting). You may have this done every 1-3 years. Abdominal aortic  aneurysm (AAA) screening. You may need this if you are a current or former smoker. Osteoporosis. You may be screened starting at age 51 if you are at high risk. Talk with your health care provider about your test results, treatment options, and if necessary, the need for more tests. Vaccines  Your health care provider may recommend certain vaccines, such as: Influenza vaccine. This is recommended every  year. Tetanus, diphtheria, and acellular pertussis (Tdap, Td) vaccine. You may need a Td booster every 10 years. Zoster vaccine. You may need this after age 96. Pneumococcal 13-valent conjugate (PCV13) vaccine. One dose is recommended after age 65. Pneumococcal polysaccharide (PPSV23) vaccine. One dose is recommended after age 55. Talk to your health care provider about which screenings and vaccines you need and how often you need them. This information is not intended to replace advice given to you by your health care provider. Make sure you discuss any questions you have with your health care provider. Document Released: 09/06/2015 Document Revised: 04/29/2016 Document Reviewed: 06/11/2015 Elsevier Interactive Patient Education  2017 [place] Prevention in the Home Falls can cause injuries. They can happen to people of all ages. There are many things you can do to make your home safe and to help prevent falls. What can I do on the outside of my home? Regularly fix the edges of walkways and driveways and fix any cracks. Remove anything that might make you trip as you walk through a door, such as a raised step or threshold. Trim any bushes or trees on the path to your home. Use bright outdoor lighting. Clear any walking paths of anything that might make someone trip, such as rocks or tools. Regularly check to see if handrails are loose or broken. Make sure that both sides of any steps have handrails. Any raised decks and porches should have guardrails on the edges. Have any leaves, snow, or ice cleared regularly. Use sand or salt on walking paths during winter. Clean up any spills in your garage right away. This includes oil or grease spills. What can I do in the bathroom? Use night lights. Install grab bars by the toilet and in the tub and shower. Do not use towel bars as grab bars. Use non-skid mats or decals in the tub or shower. If you need to sit down in the shower, use a  plastic, non-slip stool. Keep the floor dry. Clean up any water that spills on the floor as soon as it happens. Remove soap buildup in the tub or shower regularly. Attach bath mats securely with double-sided non-slip rug tape. Do not have throw rugs and other things on the floor that can make you trip. What can I do in the bedroom? Use night lights. Make sure that you have a light by your bed that is easy to reach. Do not use any sheets or blankets that are too big for your bed. They should not hang down onto the floor. Have a firm chair that has side arms. You can use this for support while you get dressed. Do not have throw rugs and other things on the floor that can make you trip. What can I do in the kitchen? Clean up any spills right away. Avoid walking on wet floors. Keep items that you use a lot in easy-to-reach places. If you need to reach something above you, use a strong step stool that has a grab bar. Keep electrical cords out of the way. Do not use floor  polish or wax that makes floors slippery. If you must use wax, use non-skid floor wax. Do not have throw rugs and other things on the floor that can make you trip. What can I do with my stairs? Do not leave any items on the stairs. Make sure that there are handrails on both sides of the stairs and use them. Fix handrails that are broken or loose. Make sure that handrails are as long as the stairways. Check any carpeting to make sure that it is firmly attached to the stairs. Fix any carpet that is loose or worn. Avoid having throw rugs at the top or bottom of the stairs. If you do have throw rugs, attach them to the floor with carpet tape. Make sure that you have a light switch at the top of the stairs and the bottom of the stairs. If you do not have them, ask someone to add them for you. What else can I do to help prevent falls? Wear shoes that: Do not have high heels. Have rubber bottoms. Are comfortable and fit you  well. Are closed at the toe. Do not wear sandals. If you use a stepladder: Make sure that it is fully opened. Do not climb a closed stepladder. Make sure that both sides of the stepladder are locked into place. Ask someone to hold it for you, if possible. Clearly mark and make sure that you can see: Any grab bars or handrails. First and last steps. Where the edge of each step is. Use tools that help you move around (mobility aids) if they are needed. These include: Canes. Walkers. Scooters. Crutches. Turn on the lights when you go into a dark area. Replace any light bulbs as soon as they burn out. Set up your furniture so you have a clear path. Avoid moving your furniture around. If any of your floors are uneven, fix them. If there are any pets around you, be aware of where they are. Review your medicines with your doctor. Some medicines can make you feel dizzy. This can increase your chance of falling. Ask your doctor what other things that you can do to help prevent falls. This information is not intended to replace advice given to you by your health care provider. Make sure you discuss any questions you have with your health care provider. Document Released: 06/06/2009 Document Revised: 01/16/2016 Document Reviewed: 09/14/2014 Elsevier Interactive Patient Education  2017 [employer].

## 2022-06-10 NOTE — Progress Notes (Signed)
Subjective:   Randy Carmickle. is a 71 y.o. male who presents for Medicare Annual/Subsequent preventive examination.  I connected with  [name] on 06/10/22 by a audio enabled telemedicine application and verified that I am speaking with the correct person using two identifiers.  Patient Location: Home  Provider Location: Office/Clinic  I discussed the limitations of evaluation and management by telemedicine. The patient expressed understanding and agreed to proceed.   Review of Systems    Defer to PCP Cardiac Risk Factors include: advanced age (>94mn, >>66women);diabetes mellitus;dyslipidemia;male gender;hypertension     Objective:    There were no vitals filed for this visit. There is no height or weight on file to calculate BMI.     06/10/2022    2:23 PM 04/28/2022    9:04 AM 01/06/2022    8:37 AM 12/05/2021    7:17 PM 06/02/2021   11:46 AM 10/22/2020   11:14 AM 04/22/2020   11:11 AM  Advanced Directives  Does Patient Have a Medical Advance Directive? _0  No No  Would patient like information on creating a medical advance directive? No - Patient declined  No - Patient declined No - Patient declined Yes (MAU/Ambulatory/Procedural Areas - Information given)      Current Medications (verified) Outpatient Encounter Medications as of 06/10/2022  Medication Sig   aspirin EC 81 MG tablet Take 81 mg by mouth daily.   atorvastatin (LIPITOR) 80 MG tablet Take 1 tablet (80 mg total) by mouth at bedtime.   Blood Glucose Monitoring Suppl (ONE TOUCH ULTRA 2) w/Device KIT by Does not apply route.   carvedilol (COREG) 12.5 MG tablet Take 1 tablet (12.5 mg total) by mouth 2 (two) times daily with a meal.   clopidogrel (PLAVIX) 75 MG tablet Take 1 tablet (75 mg total) by mouth daily.   Continuous Blood Gluc Sensor (FREESTYLE LIBRE 2 SENSOR) MISC 1 Device by Does not apply route every 14 (fourteen) days.   dapagliflozin propanediol (FARXIGA) 10 MG TABS tablet Take 1  tablet (10 mg total) by mouth daily.   FEROSUL 325 (65 Fe) MG tablet TAKE 1 TABLET BY MOUTH DAILY   FLUoxetine (PROZAC) 40 MG capsule Take 1 capsule (40 mg total) by mouth daily.   gabapentin (NEURONTIN) 100 MG capsule Take 1 capsule (100 mg total) by mouth 3 (three) times daily.   glucose blood (ONETOUCH ULTRA) test strip Check blood sugar 2-3 times daily.  Dx Code: E11.9   insulin glargine (LANTUS SOLOSTAR) 100 UNIT/ML Solostar Pen Inject 36 Units into the skin daily.   Insulin Pen Needle 32G X 4 MM MISC 1 Device by Does not apply route daily in the afternoon.   magnesium oxide (MAG-OX) 400 MG tablet Take 1 tablet (400 mg total) by mouth 2 (two) times daily.   metFORMIN (GLUCOPHAGE) 1000 MG tablet Take 1 tablet (1,000 mg total) by mouth 2 (two) times daily with a meal.   Multiple Vitamins-Minerals (MULTIVITAMIN WITH MINERALS) tablet Take 1 tablet by mouth daily.   nitroGLYCERIN (NITROSTAT) 0.4 MG SL tablet Place 1 tablet (0.4 mg total) under the tongue every 5 (five) minutes as needed for chest pain. (Patient not taking: Reported on 05/29/2022)   pantoprazole (PROTONIX) 40 MG tablet Take 1 tablet (40 mg total) by mouth 2 (two) times daily.   Potassium 99 MG TABS Take 99 mg by mouth daily.   potassium chloride SA (KLOR-CON M) 20 MEQ tablet Take 1 tablet (20 mEq total)  by mouth daily.   primidone (MYSOLINE) 50 MG tablet 4 in the AM, 3 at night   sacubitril-valsartan (ENTRESTO) 24-26 MG Take 1 tablet by mouth 2 (two) times daily.   spironolactone (ALDACTONE) 25 MG tablet Take 1 tablet (25 mg total) by mouth daily.   vitamin B-12 (CYANOCOBALAMIN) 1000 MCG tablet Take 1,000 mcg by mouth daily.   Vitamin D, Ergocalciferol, (DRISDOL) 1.25 MG (50000 UNIT) CAPS capsule Take 1 capsule (50,000 Units total) by mouth every 7 (seven) days.   No facility-administered encounter medications on file as of 06/10/2022.    Allergies (verified) Trulicity [dulaglutide]   History: Past Medical History:   Diagnosis Date   Anxiety    Arthritis    BCC (basal cell carcinoma of skin) 12/2020   CAD (coronary artery disease)    a. NSTEMI 12/12: EF 40-45%. [id number] balloon PTCA + Promus DES x 1 to mid LAD;  b. 11/2012: Cath with Cutting balloon PTCA  LAD for ISR to LAD, EF 55%;  c. 12/2012 Cath/PCI: LAD stents patent, 80 ost Diag (jailed)->PTCA, LCX/RCA patent. d. s/p NSTEMI in 11/2021 with DES to D1   Essential hypertension    GI bleed    History of blood transfusion    was getting 4 units   Hyperlipidemia    Ischemic cardiomyopathy    a.  echo 08/06/11: dist ant wall, apical and septal and infero-apical HK, mild LVH, EF 40%, mild LAE, PASP 34, asc aorta mildly dilated, mild TR;  b.  Echo (3/13) showed recovery of LV systolic function with EF 25-95%, grade I diastolic dysfunction, mild MR.    Myocardial infarction [facility])    twice with NSTEMI 2012   Palpitations    Type 2 diabetes mellitus [facility])    Past Surgical History:  Procedure Laterality Date   ANTERIOR CERVICAL DECOMP/DISCECTOMY FUSION  in the 90s   CHOLECYSTECTOMY  03/27/2019   COLONOSCOPY  2021   COLONOSCOPY WITH ESOPHAGOGASTRODUODENOSCOPY (EGD)  10/2019   CORONARY ANGIOPLASTY  12/15/2012; 12/22/2012   CORONARY ANGIOPLASTY WITH STENT PLACEMENT  07/2011   "1" (12/22/2012)   CORONARY STENT INTERVENTION N/A 12/08/2021   Procedure: CORONARY STENT INTERVENTION;  Surgeon: Early Osmond, MD;  Location: [place] [facility] LAB;  Service: Cardiovascular;  Laterality: N/A;   GASTRIC BYPASS  12/21/2017   KNEE ARTHROSCOPY Left 12/31/2016   LEFT HEART CATH AND CORONARY ANGIOGRAPHY N/A 12/08/2021   Procedure: LEFT HEART CATH AND CORONARY ANGIOGRAPHY;  Surgeon: Early Osmond, MD;  Location: [place] [facility];  Service: Cardiovascular;  Laterality: N/A;   LEFT HEART CATHETERIZATION WITH CORONARY ANGIOGRAM N/A 08/06/2011   Procedure: LEFT HEART CATHETERIZATION WITH CORONARY ANGIOGRAM;  Surgeon: Burnell Blanks, MD;  Location: [facility] CATH LAB;   Service: Cardiovascular;  Laterality: N/A;   LEFT HEART CATHETERIZATION WITH CORONARY ANGIOGRAM N/A 12/15/2012   Procedure: LEFT HEART CATHETERIZATION WITH CORONARY ANGIOGRAM;  Surgeon: Sherren Mocha, MD;  Location: [facility] CATH LAB;  Service: Cardiovascular;  Laterality: N/A;   LEFT HEART CATHETERIZATION WITH CORONARY ANGIOGRAM N/A 12/22/2012   Procedure: LEFT HEART CATHETERIZATION WITH CORONARY ANGIOGRAM;  Surgeon: Sherren Mocha, MD;  Location: [facility] CATH LAB;  Service: Cardiovascular;  Laterality: N/A;   PERCUTANEOUS CORONARY STENT INTERVENTION (PCI-S) Right 12/15/2012   Procedure: PERCUTANEOUS CORONARY STENT INTERVENTION (PCI-S);  Surgeon: Sherren Mocha, MD;  Location: [facility] CATH LAB;  Service: Cardiovascular;  Laterality: Right;   Family History  Problem Relation Age of Onset   Diabetes Brother    Lung cancer Brother  Diabetes Mother    Coronary artery disease Father    Heart attack Father 8   Diabetes Sister    Coronary artery disease Sister    Colon cancer Neg Hx    Prostate cancer Neg Hx    Stroke Neg Hx    Esophageal cancer Neg Hx    Colon polyps Neg Hx    Rectal cancer Neg Hx    Stomach cancer Neg Hx    Social History   Socioeconomic History   Marital status: Married    Spouse name: Not on file   Number of children: 2    Years of education: Not on file   Highest education level: Not on file  Occupational History   Occupation: retired--maintenance tech  Tobacco Use   Smoking status: Former    Packs/day: 1.00    Years: 25.00    Total pack years: 25.00    Types: Cigarettes    Quit date: 04/12/1992    Years since quitting: 30.1   Smokeless tobacco: Never  Vaping Use   Vaping Use: Never used  Substance and Sexual Activity   Alcohol use: Not Currently    Comment: 12/22/2012 "rarely maybe every 3-4 months, beer"   Drug use: No   Sexual activity: Yes  Other Topics Concern   Not on file  Social History Narrative   Moved back to [place] from [place]-- pt and wife    Hobby: Dietitian   Social Determinants of Health   Financial Resource Strain: Medium Risk (02/16/2022)   Overall Financial Resource Strain (CARDIA)    Difficulty of Paying Living Expenses: Somewhat hard  Food Insecurity: No Food Insecurity (06/02/2021)   Hunger Vital Sign    Worried About Running Out of Food in the Last Year: Never true    Ran Out of Food in the Last Year: Never true  Transportation Needs: No Transportation Needs (05/26/2021)   PRAPARE - Hydrologist (Medical): No    Lack of Transportation (Non-Medical): No  Physical Activity: Insufficiently Active (05/05/2022)   Exercise Vital Sign    Days of Exercise per Week: 2 days    Minutes of Exercise per Session: 40 min  Stress: No Stress Concern Present (06/02/2021)   [place]    Feeling of Stress : Not at all  Social Connections: Moderately Integrated (06/02/2021)   Social Connection and Isolation Panel [NHANES]    Frequency of Communication with Friends and Family: More than three times a week    Frequency of Social Gatherings with Friends and Family: More than three times a week    Attends Religious Services: More than 4 times per year    Active Member of [employer] or Organizations: No    Attends Music therapist: Never    Marital Status: Married    Tobacco Counseling Counseling given: Not Answered   Clinical Intake:  Pre-visit preparation completed: Yes  Pain : No/denies pain  How often do you need to have someone help you when you read instructions, pamphlets, or other written materials from your doctor or pharmacy?: 1 - Never  Diabetic? Yes Nutrition Risk Assessment:  Has the patient had any N/V/D within the last 2 months?  No  Does the patient have any non-healing wounds?  No  Has the patient had any unintentional weight loss or weight gain?  No   Diabetes:  Is the patient diabetic?  Yes  If diabetic, was a CBG obtained today?  No  Did the patient bring in their glucometer from home?   Audio visit How often do you monitor your CBG's? Wears CGM.   Financial Strains and Diabetes Management:  Are you having any financial strains with the device, your supplies or your medication? No .  Does the patient want to be seen by Chronic Care Management for management of their diabetes?  No  Would the patient like to be referred to a Nutritionist or for Diabetic Management?  No   Diabetic Exams:  Diabetic Eye Exam: Completed 06/18/21 Diabetic Foot Exam: Completed 05/29/22    Interpreter Needed?: No  Information entered by :: Beatris Ship, CMA   Activities of Daily Living    06/10/2022    2:25 PM 12/06/2021    5:00 PM  In your present state of health, do you have any difficulty performing the following activities:  Hearing? 0 0  Vision? 0 0  Difficulty concentrating or making decisions? 0 0  Walking or climbing stairs? 1 1  Comment bad knees and balance issues   Dressing or bathing? 0 0  Doing errands, shopping? 0 0  Preparing Food and eating ? N   Using the Toilet? N   Managing your Medications? N   Managing your Finances? N   Housekeeping or managing your Housekeeping? N     Patient Care Team: Colon Branch, MD as PCP - General (Internal Medicine) Satira Sark, MD as PCP - Cardiology (Cardiology) Larey Dresser, MD as Consulting Physician (Cardiology) Lake Bells., MD as Referring Physician (Gastroenterology) Gearlean Alf, PA-C as Physician Assistant (Pulmonary Disease) Demetrius Revel, MD as Consulting Physician (Bariatrics) [name], [name], DO as Consulting Physician (Neurology) [name], RPH-CPP (Pharmacist) Madelin Headings, DO (Optometry)  Indicate any recent Medical Services you may have received from other than [facility] providers in the past year (date may be approximate).     Assessment:   This is a routine wellness examination for  Randy Castro.  Hearing/Vision screen No results found.  Dietary issues and exercise activities discussed: Current Exercise Habits: Home exercise routine, Type of exercise: treadmill;Other - see comments (stationary bike), Time (Minutes): 30, Frequency (Times/Week): 3, Weekly Exercise (Minutes/Week): 90, Intensity: Mild, Exercise limited by: orthopedic condition(s)   Goals Addressed             This Visit's Progress    Increase physical activity   On track    Exercise daily        Depression Screen    06/10/2022    2:24 PM 05/29/2022    9:28 AM 04/03/2022    1:59 PM 01/06/2022    8:35 AM 11/27/2021    8:47 AM 10/21/2021   11:02 AM 07/23/2021    8:58 AM  PHQ 2/9 Scores  PHQ - 2 Score 0 1 1 0 0 1 0  PHQ- 9 Score   _0 Fall Risk    06/10/2022    2:23 PM 05/29/2022    9:28 AM 04/28/2022    9:04 AM 01/06/2022    8:02 AM 11/27/2021    8:47 AM  Fall Risk   Falls in the past year? _1 Number falls in past yr: 1 0 _2 Injury with Fall? 0 1 0 0 1  Risk for fall due to : History of fall(s);Impaired balance/gait   History of fall(s);Impaired balance/gait;Other (Comment) Impaired balance/gait  Risk for fall due to: Comment    neuropathy in feet   Follow up Falls evaluation completed Falls evaluation completed  Falls evaluation completed Falls prevention discussed;Falls evaluation completed    FALL RISK PREVENTION PERTAINING TO THE HOME:  Any stairs in or around the home? Yes  If so, are there any without handrails? No  Home free of loose throw rugs in walkways, pet beds, electrical cords, etc? Yes  Adequate lighting in your home to reduce risk of falls? Yes   ASSISTIVE DEVICES UTILIZED TO PREVENT FALLS:  Life alert? No  Use of a cane, walker or w/c? Yes  Grab bars in the bathroom? No  Shower chair or bench in shower? No  Elevated toilet seat or a handicapped toilet? No   TIMED UP AND GO:  Was the test performed?  Audio visit .    Cognitive Function:     04/27/2017    9:35 AM  MMSE - Mini Mental State Exam  Orientation to time 5  Orientation to Place 5  Registration 3  Attention/ Calculation 5  Recall 3  Language- name 2 objects 2  Language- repeat 1  Language- follow 3 step command 3  Language- read & follow direction 1  Write a sentence 1  Copy design 1  Total score 30        06/10/2022    2:31 PM  6CIT Screen  What Year? 0 points  What month? 0 points  What time? 0 points  Count back from 20 0 points  Months in reverse 0 points  Repeat phrase 0 points  Total Score 0 points    Immunizations Immunization History  Administered Date(s) Administered   Fluad Quad(high Dose 65+) 05/04/2019, 05/29/2022   Influenza Split 06/30/2011   Influenza, High Dose Seasonal PF 05/31/2013, 05/27/2017, 06/07/2018   Influenza, Seasonal, Injecte, Preservative Fre 07/28/2012   Influenza,inj,Quad PF,6+ Mos 05/29/2014, 06/07/2015, 04/23/2016   Influenza-Unspecified 05/24/2020, 06/24/2021   Moderna Covid-19 Vaccine Bivalent Booster 80yr & up 08/29/2021   PFIZER(Purple Top)SARS-COV-2 Vaccination 10/11/2019, 11/01/2019, 05/20/2020   Pneumococcal Conjugate-13 12/05/2014   Pneumococcal Polysaccharide-23 08/07/2011, 10/12/2018   Tdap 05/31/2013   Zoster Recombinat (Shingrix) 04/17/2021, 06/24/2021   Zoster, Live 11/28/2012    TDAP status: Up to date  Flu Vaccine status: Up to date  Pneumococcal vaccine status: Up to date  Covid-19 vaccine status: Information provided on how to obtain vaccines.   Qualifies for Shingles Vaccine? Yes   Zostavax completed Yes   Shingrix Completed?: Yes  Screening Tests Health Maintenance  Topic Date Due   Diabetic kidney evaluation - Urine ACR  05/29/2027 (Originally 01/07/2022)   OPHTHALMOLOGY EXAM  06/18/2022   HEMOGLOBIN A1C  11/26/2022   COLONOSCOPY (Pts 45-463yrInsurance coverage will need to be confirmed)  12/26/2022   Diabetic kidney evaluation - GFR measurement  05/28/2023   FOOT EXAM   05/30/2023   TETANUS/TDAP  06/01/2023   Pneumonia Vaccine 70Years old  Completed   INFLUENZA VACCINE  Completed   Hepatitis C Screening  Completed   Zoster Vaccines- Shingrix  Completed   HPV VACCINES  Aged Out   COVID-19 Vaccine  Discontinued   Fecal DNA (Cologuard)  Discontinued    Health Maintenance  There are no preventive care reminders to display for this patient.  Colorectal cancer screening: Type of screening: Colonoscopy. Completed 12/26/19. Repeat every 3 years  Lung Cancer Screening: (Low Dose CT Chest recommended if Age 582-80ears, 30 pack-year currently smoking OR have quit w/in 15years.)  does not qualify.   Lung Cancer Screening Referral: N/a  Additional Screening:  Hepatitis C Screening: does qualify; Completed 06/07/15  Vision Screening: Recommended annual ophthalmology exams for early detection of glaucoma and other disorders of the eye. Is the patient up to date with their annual eye exam?  Yes  Who is the provider or what is the name of the office in which the patient attends annual eye exams? [facility] in [place] If pt is not established with a provider, would they like to be referred to a provider to establish care? No .   Dental Screening: Recommended annual dental exams for proper oral hygiene  Community Resource Referral / Chronic Care Management: CRR required this visit?  No   CCM required this visit?  No      Plan:     I have personally reviewed and noted the following in the patient's chart:   Medical and social history Use of alcohol, tobacco or illicit drugs  Current medications and supplements including opioid prescriptions. Patient is not currently taking opioid prescriptions. Functional ability and status Nutritional status Physical activity Advanced directives List of other physicians Hospitalizations, surgeries, and ER visits in previous 12 months Vitals Screenings to include cognitive, depression, and falls Referrals and  appointments  In addition, I have reviewed and discussed with patient certain preventive protocols, quality metrics, and best practice recommendations. A written personalized care plan for preventive services as well as general preventive health recommendations were provided to patient.   Due to this being a telephonic visit, the after visit summary with patients personalized plan was offered to patient via mail or my-chart.  Patient would like to access on my-chart.  Beatris Ship, [place]   06/10/2022   Nurse Notes: None

## 2022-06-11 ENCOUNTER — Other Ambulatory Visit: Payer: Self-pay

## 2022-06-11 ENCOUNTER — Encounter: Payer: Self-pay | Admitting: Internal Medicine

## 2022-06-11 MED ORDER — LANTUS SOLOSTAR 100 UNIT/ML ~~LOC~~ SOPN: 36.0000 [IU] | PEN_INJECTOR | Freq: Every day | SUBCUTANEOUS | 3 refills | Status: DC

## 2022-06-30 ENCOUNTER — Other Ambulatory Visit: Payer: Self-pay | Admitting: Internal Medicine

## 2022-09-01 ENCOUNTER — Ambulatory Visit: Payer: PPO | Admitting: Pharmacist

## 2022-09-01 DIAGNOSIS — I5022 Chronic systolic (congestive) heart failure: Secondary | ICD-10-CM

## 2022-09-01 DIAGNOSIS — E559 Vitamin D deficiency, unspecified: Secondary | ICD-10-CM

## 2022-09-01 DIAGNOSIS — E1165 Type 2 diabetes mellitus with hyperglycemia: Secondary | ICD-10-CM

## 2022-09-01 NOTE — Patient Instructions (Signed)
Randy Castro It was a pleasure speaking with you today.  Below is a summary of your health goals and summary of our recent visit.   Diabetes: Last A1c was 8.3% - above goal.  Continue Lantus to 36 units daily, Farxiga '10mg'$  daily and metformin '1000mg'$  twice a day. Restart been using Continuous Glucose Monitor to check blood glucose and monitor blood glucose trends. This is a good tool to help remind you how foods can affect blood glucose.  Limit intake of sugar and sweets. Eye Exam - last was 2022 at [facility]. Important to schedule annual eye exam.   Heart Failure: Controlled Continue spironolactone and Entresto   Low Potassium:   Contact office if you experience fatigue, changes in heart rate or  muscle spasms, to have potassium checked sooner.  Continue over-the-counter potassium '99mg'$  daily   Low vitamin D:  Continue vitamin D 50,000units weekly for 6 months (thru 11/2022). Due to recheck vitamin D level 11/2022.   Increase duration / frequency of exercise. Goal is to get at least 150 minutes per week  As always if you have any questions or concerns especially regarding medications, please feel free to contact me either at the phone number below or with a MyChart message.   Keep up the good work!  [name], PharmD Clinical Pharmacist [facility] [phone] (direct line)  [phone] (main office number)    How to Treat a Low Glucose Level:  If you have a low blood glucose less than 70, please eat / drink 15 grams of carbohydrates (4 oz of juice, soda, 4 glucose tablets, or 3-4 pieces of hard candy).  It is best so choose a "quick" source of sugar that does no contain fat (chocolate and peanut butter might take longer to increase your blood glucose) Wait 15 minutes and then recheck your blood glucose. If your blood glucose is still less than 70, eat another 15 grams of carbohydrates.  Wait another 15 minutes and recheck your glucose.  Continue  this until your blood glucose is over 70. Once you blood glucose is over 70, eat a snack with protein in it to prevent your blood glucose from dropping again.   Patient verbalizes understanding of instructions and care plan provided today and agrees to view in [place]. Active MyChart status and patient understanding of how to access instructions and care plan via MyChart confirmed with patient.

## 2022-09-01 NOTE — Progress Notes (Signed)
Pharmacy Note  09/01/2022 Name: Braelynn Lupton. MRN: [id number] DOB: 24-Feb-1951  Subjective: [name] is a 72 y.o. year old male who is a primary care patient of Colon Branch, MD. Clinical Pharmacist Practitioner referral was placed to assist with medication management.    Engaged with patient by telephone for follow up visit today.  Type 2 DM: Last A1c was 8.3%. Dr Kelton Pillar increased Lantus to 36 units daily. Also take [place] '10mg'$  daily and metformin '1000mg'$  twice a day. Patient has not been using Continuous Glucose Monitor to check blood glucose but he has sensors on hand. He has occasionally check blood glucose with fingerstick blood glucose at home. Reports reading ranged form 130's to 194.  He reports 1 or 2 episodes of hypoglycemia symptoms over the last 2 months. Occurred when he skipped a meal. He did not verify with fingerstick blood glucose check and was not wearing Continuous Glucose Monitor when events occurred but he reports he ate a snack and felt better after.  Eye Exam - last was 2022 at [place] - Patient admits to dietary indiscretions during the holidays. Eating sweets like cookies.   CHF: patient has been more compliant with Delene Loll since qualified for [employer]. Cost of Delene Loll is current $0. EF 04/23/2022 was 40-45% which was improved from 35-40%. Denied edema or shortness of breath.   Hypokalemia:  Potassium was low at 3.3 when checked 05/27/2022. Dr Kelton Pillar prescribed potassium 20 mEq daily for 1 month only. Patient has completed Rx and is back to taking over-the-counter potassium supplement '99mg'$  daily. Patient denies symptoms of low potassium - fatigue has improved, no muscles spasms, changes in HR.   Low vitamin D:  Noted that serum vitamin D was also low 05/27/2022 at 18.29. Dr Kelton Pillar started Rx vitamin D 50,000units weekly for 6 months. Has been added to his adherence packaging.   Medication management:  Continues to use  adhernece packaging with good results.  He has a Set designer for CHF medications to cover cost of [place]. Fatima Sanger is active thru 01/2023.    Exercise - reports walking about 2 times per week for about 20 to 30 minutes.    SDOH (Social Determinants of Health) assessments and interventions performed:  SDOH Interventions    Flowsheet Row Office Visit from 09/01/2022 in [employer] at [place] [facility] from 05/05/2022 in [facility] [place] from 04/03/2022 in [place] Management from 02/16/2022 in [facility] [place] from 01/06/2022 in [place] Office Visit from 10/21/2021 in [facility] at [place] Interventions        Depression Interventions/Treatment  -- -- Currently on Treatment, Medication -- Medication, Currently on Treatment Counseling, Currently on Treatment  Financial Strain Interventions -- -- -- Other (Comment)  [Patient is getting Farxiga from MAP,  assisting with completion of MAP application for Entresto today.] -- --  Physical Activity Interventions Other (Comments)  [discussed getting 150 minutes per week] Other (Comments)  [patient and wife encouraged and planning in increase frequency of exerice over the next few months.] -- -- -- --        Objective: Review of patient status, including review of consultants reports, laboratory and other test data, was performed as part of comprehensive.  Lab  Results  Component Value Date   CREATININE 0.91 05/27/2022   CREATININE 0.84 01/20/2022   CREATININE 0.67 (L) 12/22/2021    Lab Results  Component Value Date   HGBA1C 8.3 (A) 05/27/2022       Component Value Date/Time   CHOL 110 11/27/2021 0930   TRIG 121.0 11/27/2021 0930   HDL  45.40 11/27/2021 0930   CHOLHDL 2 11/27/2021 0930   VLDL 24.2 11/27/2021 0930   LDLCALC 40 11/27/2021 0930   LDLDIRECT 145.0 04/26/2018 1026     Clinical ASCVD: Yes  The ASCVD Risk score (Arnett DK, et al., 2019) failed to calculate for the following reasons:   The patient has a prior MI or stroke diagnosis    BP Readings from Last 3 Encounters:  05/29/22 136/76  05/27/22 132/78  05/27/22 122/68     Allergies  Allergen Reactions   Trulicity [Dulaglutide] Nausea And Vomiting    Medications Reviewed Today     Reviewed by [name], RPH-CPP (Pharmacist) on 09/01/22 at [place] List Status: <None>   Medication Order Taking? Sig Documenting Provider Last Dose Status Informant  aspirin EC 81 MG tablet [id number] Yes Take 81 mg by mouth daily. [provider] Taking Active Spouse/Significant Other  atorvastatin (LIPITOR) 80 MG tablet [id number] Yes Take 1 tablet (80 mg total) by mouth at bedtime. Colon Branch, MD Taking Active   Blood Glucose Monitoring Suppl (ONE TOUCH ULTRA 2) w/Device KIT [id number] Yes by Does not apply route. [provider] Taking Active Spouse/Significant Other  carvedilol (COREG) 12.5 MG tablet [id number] Yes Take 1 tablet (12.5 mg total) by mouth 2 (two) times daily with a meal. Colon Branch, MD Taking Active   clopidogrel (PLAVIX) 75 MG tablet [id number] Yes Take 1 tablet (75 mg total) by mouth daily. Loel Dubonnet, NP Taking Active Spouse/Significant Other  Continuous Blood Gluc Sensor (FREESTYLE LIBRE 2 SENSOR) MISC [id number] No 1 Device by Does not apply route every 14 (fourteen) days.  Patient not taking: Reported on 09/01/2022   Shamleffer, Melanie Crazier, MD Not Taking Active Spouse/Significant Other  dapagliflozin propanediol (FARXIGA) 10 MG TABS tablet [id number] Yes Take 1 tablet (10 mg total) by mouth daily. Shamleffer, Melanie Crazier, MD Taking Active   FEROSUL 325 [phone]) MG tablet [id number] Yes TAKE 1 TABLET BY MOUTH DAILY  Cirigliano, Vito V, DO Taking Active Spouse/Significant Other  FLUoxetine (PROZAC) 40 MG capsule [id number] Yes Take 1 capsule (40 mg total) by mouth daily. Colon Branch, MD Taking Active   gabapentin (NEURONTIN) 100 MG capsule [id number] Yes Take 1 capsule (100 mg total) by mouth 3 (three) times daily. Colon Branch, MD Taking Active   glucose blood [facility] ULTRA) test strip [id number] Yes Check blood sugar 2-3 times daily.  Dx Code: E11.9 Colon Branch, MD Taking Active Spouse/Significant Other  insulin glargine (LANTUS SOLOSTAR) 100 UNIT/ML Solostar Pen [id number] Yes Inject 36 Units into the skin daily. Shamleffer, Melanie Crazier, MD Taking Active   Insulin Pen Needle 32G X 4 MM MISC [id number] Yes 1 Device by Does not apply route daily in the afternoon. Shamleffer, Melanie Crazier, MD Taking Active   magnesium oxide (MAG-OX) 400 MG tablet [id number] Yes Take 1 tablet (400 mg total) by mouth 2 (two) times daily. Colon Branch, MD Taking Active   metFORMIN (GLUCOPHAGE) 1000 MG tablet [id number] Yes Take 1 tablet (1,000 mg total) by mouth 2 (two) times daily with a meal. Shamleffer, Melanie Crazier, MD Taking Active  Multiple Vitamins-Minerals (MULTIVITAMIN WITH MINERALS) tablet [id number] Yes Take 1 tablet by mouth daily. [provider] Taking Active Spouse/Significant Other  nitroGLYCERIN (NITROSTAT) 0.4 MG SL tablet [id number]  Place 1 tablet (0.4 mg total) under the tongue every 5 (five) minutes as needed for chest pain.  Patient not taking: Reported on 05/29/2022   Loel Dubonnet, NP  Active Spouse/Significant Other           Med Note (CANTER, Cheri Rous   Fri May 29, 2022  9:13 AM) PRN  pantoprazole (PROTONIX) 40 MG tablet [id number] Yes Take 1 tablet (40 mg total) by mouth 2 (two) times daily. Colon Branch, MD Taking Active   Potassium 99 MG TABS [id number] Yes Take 99 mg by mouth daily. [provider] Taking Active Spouse/Significant Other  potassium chloride SA (KLOR-CON M) 20 MEQ  tablet [id number] No Take 1 tablet (20 mEq total) by mouth daily.  Patient not taking: Reported on 09/01/2022   Shamleffer, Melanie Crazier, MD Not Taking Active            Med Note [facility], [place]   Tue Sep 01, 2022  9:00 AM) Evelina Bucy for 1 month only per Dr Kelton Pillar  primidone (MYSOLINE) 50 MG tablet [id number] Yes 4 in the AM, 3 at night [name], [name], DO Taking Active   sacubitril-valsartan (ENTRESTO) 24-26 MG [id number] Yes Take 1 tablet by mouth 2 (two) times daily. Colon Branch, MD Taking Active   spironolactone (ALDACTONE) 25 MG tablet [id number] Yes Take 1 tablet (25 mg total) by mouth daily. Erma Heritage, PA-C Taking Active   vitamin B-12 (CYANOCOBALAMIN) 1000 MCG tablet [id number] Yes Take 1,000 mcg by mouth daily. [provider] Taking Active Spouse/Significant Other  Vitamin D, Ergocalciferol, (DRISDOL) 1.25 MG (50000 UNIT) CAPS capsule [id number] Yes Take 1 capsule (50,000 Units total) by mouth every 7 (seven) days. 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   Abdominal pain 11/17/2018   Cardiac arrhythmia, unspecified 11/14/2017   Hypomagnesemia 11/14/2017   Anemia 11/14/2017   OSA on CPAP 09/21/2017   Obesity 01/23/2016   PCP NOTES >>> 06/08/2015   Anxiety and depression 05/31/2013   Intermediate coronary syndrome ([place]) 12/22/2012   Unstable angina ([place]) 12/16/2012   Fatigue 04/26/2012   Annual physical exam 10/28/2011    Rash 10/28/2011   Coronary Artery Disease 08/28/2011   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   Palpitations 08/28/2011   NSTEMI (non-ST elevated myocardial infarction) ([place]) 08/07/2011   DJD (degenerative joint disease) 05/28/2011   Poorly controlled type 2 diabetes mellitus with circulatory disorder ([place])    Hyperlipidemia    HTN (hypertension)      Medication Assistance:   Wilder Glade and Entresto obtained through [employer]  medication assistance program.  Enrollment ends 01/2023   Assessment / Plan: Type 2 DM: Last A1c was 8.3% - above goal.  Continue Lantus to 36 units daily, Farxiga '10mg'$  daily and metformin '1000mg'$  twice  a day. Recommended patient restart been using Continuous Glucose Monitor to check blood glucose and monitor blood glucose trends. This is a good tool to help remind him how foods can affect blood glucose.  Encouraged patient to limit intake of sugar and sweets. Reviewed how to treat hypoglycemia. Eye Exam - last was 2022 at [facility]. Reminded patient to scheduled annual eye exam and discuss importance of yearly eye exams for diabetic patient.   CHF: Controlled Continue spironolactone and Entresto   Hypokalemia:  Due to recheck BMET / potassium. Patient to see PCP in 1 month.  Reviewed s/s of low potassium. Patient to contact office if he experience fatigue, changed in HR, muscle spasms to have potassium checked sooner.  Continue over-the-counter potassium '99mg'$  daily   Low vitamin D:  Continue vitamin D 50,000units weekly for 6 months (thru 11/2022). Due to recheck vitamin D level 11/2022.   Medication management:  Continue to use adhernece packaging  Reviewed and updated medication list Reviewed refill history and adherence    Health Maintenance:  Reminded patient to scheduled annual eye exam and discuss importance of yearly eye exams for diabetic patient.  Encouraged increase duration / frequency of exercise. Goal is to get at least 150 minutes per  week  Follow Up:  Telephone follow up appointment with care management team member scheduled for:  April 2024.   [name], PharmD Clinical Pharmacist [facility] [place] [facility] [phone]

## 2022-09-02 ENCOUNTER — Ambulatory Visit: Payer: PPO | Admitting: Physician Assistant

## 2022-09-09 ENCOUNTER — Other Ambulatory Visit: Payer: Self-pay | Admitting: Internal Medicine

## 2022-09-14 ENCOUNTER — Other Ambulatory Visit: Payer: Self-pay

## 2022-09-28 NOTE — Progress Notes (Unsigned)
Assessment/Plan:    1.  Essential Tremor  Continue primidone, 50 mg, 4 tablets in the morning, 3 tablets in the evening  2.  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  3.  B12 deficiency  -On oral supplementation  4.  F/u 1 year   Subjective:   Randy Castro. was seen today in follow up for essential tremor.  My previous records were reviewed prior to todays visit. Pt with wife who supplements hx. when I last saw him, I saw him on video, but we increased his primidone.  We decided to see him in follow-up today in person, in part because I wanted to see how this increased did, and in part because I wanted to see the rest tremor that he has started to develop on the right.  He reports today that ***.  Current prescribed movement disorder medications: Primidone, 50 mg, 4 in the morning, 3 tablets in the evening (increased)   ALLERGIES:   Allergies  Allergen Reactions   Trulicity [Dulaglutide] Nausea And Vomiting    CURRENT MEDICATIONS:  Outpatient Encounter Medications as of 09/29/2022  Medication Sig   aspirin EC 81 MG tablet Take 81 mg by mouth daily.   atorvastatin (LIPITOR) 80 MG tablet Take 1 tablet (80 mg total) by mouth at bedtime.   Blood Glucose Monitoring Suppl (ONE TOUCH ULTRA 2) w/Device KIT by Does not apply route.   carvedilol (COREG) 12.5 MG tablet Take 1 tablet (12.5 mg total) by mouth 2 (two) times daily with a meal.   clopidogrel (PLAVIX) 75 MG tablet Take 1 tablet (75 mg total) by mouth daily.   Continuous Blood Gluc Sensor (FREESTYLE LIBRE 2 SENSOR) MISC 1 Device by Does not apply route every 14 (fourteen) days. (Patient not taking: Reported on 09/01/2022)   dapagliflozin propanediol (FARXIGA) 10 MG TABS tablet Take 1 tablet (10 mg total) by mouth daily.   FEROSUL 325 (65 Fe) MG tablet TAKE 1 TABLET BY MOUTH DAILY   FLUoxetine (PROZAC) 40 MG  capsule Take 1 capsule (40 mg total) by mouth daily.   gabapentin (NEURONTIN) 100 MG capsule Take 1 capsule (100 mg total) by mouth 3 (three) times daily.   glucose blood (ONETOUCH ULTRA) test strip Check blood sugar 2-3 times daily.  Dx Code: E11.9   insulin glargine (LANTUS SOLOSTAR) 100 UNIT/ML Solostar Pen Inject 36 Units into the skin daily.   Insulin Pen Needle 32G X 4 MM MISC 1 Device by Does not apply route daily in the afternoon.   magnesium oxide (MAG-OX) 400 MG tablet Take 1 tablet (400 mg total) by mouth 2 (two) times daily.   metFORMIN (GLUCOPHAGE) 1000 MG tablet Take 1 tablet (1,000 mg total) by mouth 2 (two) times daily with a meal.   Multiple Vitamins-Minerals (MULTIVITAMIN WITH MINERALS) tablet Take 1 tablet by mouth daily.   nitroGLYCERIN (NITROSTAT) 0.4 MG SL tablet Place 1 tablet (0.4 mg total) under the tongue every 5 (five) minutes as needed for chest pain. (Patient not taking: Reported on 05/29/2022)   pantoprazole (PROTONIX) 40 MG tablet Take 1 tablet (40 mg total) by mouth daily before breakfast.   Potassium 99 MG TABS Take 99 mg by mouth daily.   potassium chloride SA (KLOR-CON M) 20 MEQ tablet Take 1 tablet (20 mEq total) by mouth daily. (Patient not taking: Reported on 09/01/2022)   primidone (MYSOLINE) 50 MG tablet 4 in  the AM, 3 at night   sacubitril-valsartan (ENTRESTO) 24-26 MG Take 1 tablet by mouth 2 (two) times daily.   spironolactone (ALDACTONE) 25 MG tablet Take 1 tablet (25 mg total) by mouth daily.   vitamin B-12 (CYANOCOBALAMIN) 1000 MCG tablet Take 1,000 mcg by mouth daily.   Vitamin D, Ergocalciferol, (DRISDOL) 1.25 MG (50000 UNIT) CAPS capsule Take 1 capsule (50,000 Units total) by mouth every 7 (seven) days.   No facility-administered encounter medications on file as of 09/29/2022.     Objective:    PHYSICAL EXAMINATION:    VITALS:   There were no vitals filed for this visit.    GEN:  The patient appears stated age and is in NAD. HEENT:   Normocephalic, atraumatic.  The mucous membranes are moist. The superficial temporal arteries are without ropiness or tenderness. CV:  RRR Lungs:  CTAB Neck/HEME:  There are no carotid bruits bilaterally.  Neurological examination:  Orientation: The patient is alert and oriented x3. Cranial nerves: There is good facial symmetry. The speech is fluent and clear. Soft palate rises symmetrically and there is no tongue deviation. Hearing is intact to conversational tone. Sensation: Sensation is intact to light touch throughout.  There is absence of vibratory sensation in the big toe and decreased in the bilateral ankle and knee. Motor: Strength is at least antigravity x4.  Movement examination: Tone: There is normal tone in the UE/LE Abnormal movements: no rest tremor.  No postural or intention tremor today Coordination:  There is no decremation with RAM's,  Gait and Station: The patient has no difficulty arising out of a deep-seated chair without the use of the hands. The patient's gait is antalgic (bad knee per pt).  He is unable to ambulate in a tandem fashion. I have reviewed and interpreted the following labs independently   Lab Results  Component Value Date   TSH 1.61 05/27/2022     Chemistry      Component Value Date/Time   NA 138 05/27/2022 0937   NA 141 01/20/2022 1056   K 3.3 (L) 05/27/2022 0937   CL 104 05/27/2022 0937   CO2 24 05/27/2022 0937   BUN 13 05/27/2022 0937   BUN 9 01/20/2022 1056   CREATININE 0.91 05/27/2022 0937   CREATININE 1.20 06/15/2016 0849      Component Value Date/Time   CALCIUM 8.9 05/27/2022 0937   ALKPHOS 53 12/07/2021 0040   AST 30 12/07/2021 0040   ALT 32 12/07/2021 0040   BILITOT 0.6 12/07/2021 0040       Lab Results  Component Value Date   HGBA1C 8.3 (A) 05/27/2022    Lab Results  Component Value Date   VITAMINB12 559 10/21/2021      Total time spent on today's visit was *** minutes, including both face-to-face time and  nonface-to-face time.  Time included that spent on review of records (prior notes available to me/labs/imaging if pertinent), discussing treatment and goals, answering patient's questions and coordinating care.  Cc:  Colon Branch, MD

## 2022-09-29 ENCOUNTER — Ambulatory Visit (INDEPENDENT_AMBULATORY_CARE_PROVIDER_SITE_OTHER): Payer: PPO | Admitting: Neurology

## 2022-09-29 ENCOUNTER — Encounter: Payer: Self-pay | Admitting: Neurology

## 2022-09-29 VITALS — BP 140/80 | HR 60 | Ht 74.0 in | Wt 216.4 lb

## 2022-09-29 DIAGNOSIS — R251 Tremor, unspecified: Secondary | ICD-10-CM

## 2022-09-29 DIAGNOSIS — R270 Ataxia, unspecified: Secondary | ICD-10-CM | POA: Diagnosis not present

## 2022-09-30 ENCOUNTER — Encounter: Payer: Self-pay | Admitting: Neurology

## 2022-09-30 ENCOUNTER — Encounter: Payer: Self-pay | Admitting: Internal Medicine

## 2022-09-30 ENCOUNTER — Ambulatory Visit (INDEPENDENT_AMBULATORY_CARE_PROVIDER_SITE_OTHER): Payer: PPO | Admitting: Internal Medicine

## 2022-09-30 VITALS — BP 126/80 | HR 59 | Temp 97.5°F | Resp 18 | Ht 74.0 in | Wt 211.5 lb

## 2022-09-30 DIAGNOSIS — E1165 Type 2 diabetes mellitus with hyperglycemia: Secondary | ICD-10-CM | POA: Diagnosis not present

## 2022-09-30 DIAGNOSIS — E1140 Type 2 diabetes mellitus with diabetic neuropathy, unspecified: Secondary | ICD-10-CM

## 2022-09-30 DIAGNOSIS — I10 Essential (primary) hypertension: Secondary | ICD-10-CM

## 2022-09-30 DIAGNOSIS — R809 Proteinuria, unspecified: Secondary | ICD-10-CM | POA: Diagnosis not present

## 2022-09-30 DIAGNOSIS — Z794 Long term (current) use of insulin: Secondary | ICD-10-CM | POA: Diagnosis not present

## 2022-09-30 LAB — BASIC METABOLIC PANEL
BUN: 8 mg/dL (ref 6–23)
CO2: 27 mEq/L (ref 19–32)
Calcium: 8.8 mg/dL (ref 8.4–10.5)
Chloride: 104 mEq/L (ref 96–112)
Creatinine, Ser: 0.61 mg/dL (ref 0.40–1.50)
GFR: 96.92 mL/min (ref >60.00)
Glucose, Bld: 108 mg/dL — ABNORMAL HIGH (ref 70–99)
Potassium: 3.9 mEq/L (ref 3.5–5.1)
Sodium: 141 mEq/L (ref 135–145)

## 2022-09-30 LAB — MICROALBUMIN / CREATININE URINE RATIO
Creatinine,U: 56.7 mg/dL
Microalb Creat Ratio: 114.3 mg/g — ABNORMAL HIGH (ref 0.0–30.0)
Microalb, Ur: 64.8 mg/dL — ABNORMAL HIGH (ref 0.0–1.9)

## 2022-09-30 LAB — HEMOGLOBIN A1C: Hgb A1c MFr Bld: 7.2 % — ABNORMAL HIGH (ref 4.6–6.5)

## 2022-09-30 MED ORDER — GABAPENTIN 300 MG PO CAPS: 300.0000 mg | ORAL_CAPSULE | Freq: Two times a day (BID) | ORAL | 1 refills | Status: DC

## 2022-09-30 NOTE — Assessment & Plan Note (Signed)
DM: Follow-up by Endo, next visit in April 2024, last A1c increasing, at the time diet was not very good, since the beginning of the year that has improved. Ambulatory CBGs ranged from 90 to the 200s.  Rarely has low sugars. Current meds: Farxiga 10 mg, Lantus 36 units, metformin twice daily. Plan: Will check A1c, advised provided regarding hypoglycemic sxs  and treatment, see AVS.  Needs to keep the appointment with Endo but in the meantime will adjust meds if needed. HTN: On multiple medicines, last potassium slightly low, took potassium supplements temporarily, check a BMP. Essential tremor: Saw neurology yesterday, they noted increased ataxia, ordered  SCA panel.  Also was rec  a ambulatory assistive device.  He presents today using a cane and denies falls. Neuropathy: Started gabapentin 100 mg 3 times daily, no help, no side effects, increase gabapentin to 300 twice daily. Preventive care: See AVS RTC 4 months

## 2022-09-30 NOTE — Progress Notes (Signed)
Subjective:    Patient ID: Randy Foots., male    DOB: 1951/04/03, 72 y.o.   MRN: [id number]  DOS:  09/30/2022 Type of visit - description: Follow-up  Since the last office visit is doing okay, other than feeling tired sometimes. Denies chest pain or difficulty breathing No lower extremity edema  Review of Systems See above   Past Medical History:  Diagnosis Date   Anxiety    Arthritis    BCC (basal cell carcinoma of skin) 12/2020   CAD (coronary artery disease)    a. NSTEMI 12/12: EF 40-45%. TGG:YIRSWNI balloon PTCA + Promus DES x 1 to mid LAD;  b. 11/2012: Cath with Cutting balloon PTCA  LAD for ISR to LAD, EF 55%;  c. 12/2012 Cath/PCI: LAD stents patent, 80 ost Diag (jailed)->PTCA, LCX/RCA patent. d. s/p NSTEMI in 11/2021 with DES to D1   Essential hypertension    GI bleed    History of blood transfusion    was getting 4 units   Hyperlipidemia    Ischemic cardiomyopathy    a.  echo 08/06/11: dist ant wall, apical and septal and infero-apical HK, mild LVH, EF 40%, mild LAE, PASP 34, asc aorta mildly dilated, mild TR;  b.  Echo (3/13) showed recovery of LV systolic function with EF 62-70%, grade I diastolic dysfunction, mild MR.    Myocardial infarction [facility])    twice with NSTEMI 2012   Palpitations    Type 2 diabetes mellitus [facility])     Past Surgical History:  Procedure Laterality Date   ANTERIOR CERVICAL DECOMP/DISCECTOMY FUSION  in the 90s   CHOLECYSTECTOMY  03/27/2019   COLONOSCOPY  2021   COLONOSCOPY WITH ESOPHAGOGASTRODUODENOSCOPY (EGD)  10/2019   CORONARY ANGIOPLASTY  12/15/2012; 12/22/2012   CORONARY ANGIOPLASTY WITH STENT PLACEMENT  07/2011   "1" (12/22/2012)   CORONARY STENT INTERVENTION N/A 12/08/2021   Procedure: CORONARY STENT INTERVENTION;  Surgeon: Early Osmond, MD;  Location: [place] CV LAB;  Service: Cardiovascular;  Laterality: N/A;   GASTRIC BYPASS  12/21/2017   KNEE ARTHROSCOPY Left 12/31/2016   LEFT HEART CATH AND CORONARY ANGIOGRAPHY N/A  12/08/2021   Procedure: LEFT HEART CATH AND CORONARY ANGIOGRAPHY;  Surgeon: Early Osmond, MD;  Location: [place] CV LAB;  Service: Cardiovascular;  Laterality: N/A;   LEFT HEART CATHETERIZATION WITH CORONARY ANGIOGRAM N/A 08/06/2011   Procedure: LEFT HEART CATHETERIZATION WITH CORONARY ANGIOGRAM;  Surgeon: Burnell Blanks, MD;  Location: [facility] CATH LAB;  Service: Cardiovascular;  Laterality: N/A;   LEFT HEART CATHETERIZATION WITH CORONARY ANGIOGRAM N/A 12/15/2012   Procedure: LEFT HEART CATHETERIZATION WITH CORONARY ANGIOGRAM;  Surgeon: Sherren Mocha, MD;  Location: [facility] CATH LAB;  Service: Cardiovascular;  Laterality: N/A;   LEFT HEART CATHETERIZATION WITH CORONARY ANGIOGRAM N/A 12/22/2012   Procedure: LEFT HEART CATHETERIZATION WITH CORONARY ANGIOGRAM;  Surgeon: Sherren Mocha, MD;  Location: [facility] CATH LAB;  Service: Cardiovascular;  Laterality: N/A;   PERCUTANEOUS CORONARY STENT INTERVENTION (PCI-S) Right 12/15/2012   Procedure: PERCUTANEOUS CORONARY STENT INTERVENTION (PCI-S);  Surgeon: Sherren Mocha, MD;  Location: [facility] CATH LAB;  Service: Cardiovascular;  Laterality: Right;    Current Outpatient Medications  Medication Instructions   aspirin EC 81 mg, Oral, Daily   atorvastatin (LIPITOR) 80 mg, Oral, Daily at bedtime   Blood Glucose Monitoring Suppl (ONE TOUCH ULTRA 2) w/Device KIT Does not apply   carvedilol (COREG) 12.5 mg, Oral, 2 times daily with meals   clopidogrel (PLAVIX) 75 mg, Oral, Daily   cyanocobalamin (  VITAMIN B12) 1,000 mcg, Oral, Daily   dapagliflozin propanediol (FARXIGA) 10 mg, Oral, Daily   FEROSUL 325 (65 Fe) MG tablet TAKE 1 TABLET BY MOUTH DAILY   FLUoxetine (PROZAC) 40 mg, Oral, Daily   gabapentin (NEURONTIN) 100 mg, Oral, 3 times daily   glucose blood (ONETOUCH ULTRA) test strip Check blood sugar 2-3 times daily.  Dx Code: E11.9   Insulin Pen Needle 32G X 4 MM MISC 1 Device, Does not apply, Daily   Lantus SoloStar 36 Units, Subcutaneous, Daily   magnesium  oxide (MAG-OX) 400 mg, Oral, 2 times daily   metFORMIN (GLUCOPHAGE) 1,000 mg, Oral, 2 times daily with meals   Multiple Vitamins-Minerals (MULTIVITAMIN WITH MINERALS) tablet 1 tablet, Oral, Daily   nitroGLYCERIN (NITROSTAT) 0.4 mg, Sublingual, Every 5 min PRN   pantoprazole (PROTONIX) 40 mg, Oral, Daily before breakfast   potassium chloride SA (KLOR-CON M) 20 MEQ tablet 20 mEq, Oral, Daily   Potassium 99 mg, Oral, Daily   primidone (MYSOLINE) 50 MG tablet 4 in the AM, 3 at night   sacubitril-valsartan (ENTRESTO) 24-26 MG 1 tablet, Oral, 2 times daily   spironolactone (ALDACTONE) 25 mg, Oral, Daily   Vitamin D (Ergocalciferol) (DRISDOL) 50,000 Units, Oral, Every 7 days       Objective:   Physical Exam BP 126/80   Pulse (!) 59   Temp (!) 97.5 F (36.4 C) (Oral)   Resp 18   Ht '6\' 2"'$  (1.88 m)   Wt 211 lb 8 oz (95.9 kg)   SpO2 97%   BMI 27.15 kg/m  General:   Well developed, NAD, BMI noted. HEENT:  Normocephalic . Face symmetric, atraumatic Lungs:  CTA B Normal respiratory effort, no intercostal retractions, no accessory muscle use. Heart: RRR,  no murmur.  Lower extremities: no pretibial edema bilaterally  Skin: Not pale. Not jaundice Neurologic:  alert & oriented X3.  Speech normal, gait appropriate for age and unassisted Psych--  Cognition and judgment appear intact.  Cooperative with normal attention span and concentration.  Behavior appropriate. No anxious or depressed appearing.      Assessment   Assessment   DM : insulin dependent,  w/ CAD CHF. Intol Trulicity, per endo Neuropathy, severe, documented by NCS 2022 HTN Hyperlipidemia CAD --  NSTEMI in 07/2011; unstable angina in 11/2012 and 12/2012. NSTMI [id number] .  CHF, ischemic cardiomyopathy, PVCs (Holter 2019 show 13.6% burden) OSA dx ~08-2017, on Cpap Anxiety-depression Essential Tremor dx 2019  Skin cancer: Morbid obesity:gastric sleeve, 11-2017 Dr Raul Del 09-2019 GI bleed, gastric ulcer, required  transfusion B12 deficiency Dx 2022 Vitamin D deficiency Dx 05-2022  PLAN DM: Follow-up by Endo, next visit in April 2024, last A1c increasing, at the time diet was not very good, since the beginning of the year that has improved. Ambulatory CBGs ranged from 90 to the 200s.  Rarely has low sugars. Current meds: Farxiga 10 mg, Lantus 36 units, metformin twice daily. Plan: Will check A1c, advised provided regarding hypoglycemic sxs  and treatment, see AVS.  Needs to keep the appointment with Endo but in the meantime will adjust meds if needed. HTN: On multiple medicines, last potassium slightly low, took potassium supplements temporarily, check a BMP. Essential tremor: Saw neurology yesterday, they noted increased ataxia, ordered  SCA panel.  Also was rec  a ambulatory assistive device.  He presents today using a cane and denies falls. Neuropathy: Started gabapentin 100 mg 3 times daily, no help, no side effects, increase gabapentin to 300 twice daily. Preventive  care: See AVS RTC 4 months

## 2022-09-30 NOTE — Patient Instructions (Addendum)
Diabetes: You can check your sugars at different times  - early in AM fasting  ( blood sugar goal 90-130) - 2 hours after a meal (blood sugar goal less than 180)  If your blood sugars are mostly not in the range please let me know or reach out to endocrinology  Always carry glucose or sugar with you.  Please read the information about hypoglycemia (low sugar)  Change gabapentin to 300 mg 1 tablet twice daily, were sending prescription   Vaccines I recommend:  Tdap (tetanus) RSV vaccine   GO TO THE LAB : Get the blood work     [name], [name] back for   a checkup in 4 months   Per our records you are due for your diabetic eye exam. Please contact your eye doctor to schedule an appointment. Please have them send copies of your office visit notes to [place]. Our fax number is [phone]. If you need a referral to an eye doctor please let us know.

## 2022-10-02 NOTE — Addendum Note (Signed)
Addended byDamita Dunnings D on: 10/02/2022 07:52 AM   Modules accepted: Orders

## 2022-10-07 ENCOUNTER — Encounter: Payer: Self-pay | Admitting: Internal Medicine

## 2022-10-22 DIAGNOSIS — R251 Tremor, unspecified: Secondary | ICD-10-CM | POA: Diagnosis not present

## 2022-10-22 DIAGNOSIS — R270 Ataxia, unspecified: Secondary | ICD-10-CM | POA: Diagnosis not present

## 2022-11-03 ENCOUNTER — Other Ambulatory Visit: Payer: Self-pay | Admitting: Neurology

## 2022-11-03 DIAGNOSIS — G250 Essential tremor: Secondary | ICD-10-CM

## 2022-11-23 ENCOUNTER — Encounter: Payer: Self-pay | Admitting: Internal Medicine

## 2022-11-23 NOTE — Telephone Encounter (Signed)
Form completed and placed at front desk for pick up. Copy sent for scanning.

## 2022-11-23 NOTE — Telephone Encounter (Signed)
History of CAD, neuropathy, essential tremor.  Please arrange for a permanent disability parking

## 2022-11-25 ENCOUNTER — Ambulatory Visit: Payer: PPO | Admitting: Internal Medicine

## 2022-11-25 ENCOUNTER — Encounter: Payer: Self-pay | Admitting: Internal Medicine

## 2022-11-25 VITALS — BP 128/82 | HR 83 | Ht 74.0 in | Wt 218.0 lb

## 2022-11-25 DIAGNOSIS — E1165 Type 2 diabetes mellitus with hyperglycemia: Secondary | ICD-10-CM

## 2022-11-25 DIAGNOSIS — R809 Proteinuria, unspecified: Secondary | ICD-10-CM

## 2022-11-25 DIAGNOSIS — Z794 Long term (current) use of insulin: Secondary | ICD-10-CM

## 2022-11-25 DIAGNOSIS — E559 Vitamin D deficiency, unspecified: Secondary | ICD-10-CM | POA: Diagnosis not present

## 2022-11-25 DIAGNOSIS — E1142 Type 2 diabetes mellitus with diabetic polyneuropathy: Secondary | ICD-10-CM | POA: Diagnosis not present

## 2022-11-25 DIAGNOSIS — E1129 Type 2 diabetes mellitus with other diabetic kidney complication: Secondary | ICD-10-CM

## 2022-11-25 DIAGNOSIS — E1159 Type 2 diabetes mellitus with other circulatory complications: Secondary | ICD-10-CM | POA: Diagnosis not present

## 2022-11-25 LAB — VITAMIN D 25 HYDROXY (VIT D DEFICIENCY, FRACTURES): VITD: 33.24 ng/mL (ref 30.00–100.00)

## 2022-11-25 MED ORDER — FREESTYLE LIBRE 3 READER DEVI: 1.0000 | 0 refills | Status: DC

## 2022-11-25 MED ORDER — FREESTYLE LIBRE 3 SENSOR MISC: 1.0000 | 3 refills | Status: DC

## 2022-11-25 MED ORDER — SEMAGLUTIDE(0.25 OR 0.5MG/DOS) 2 MG/3ML ~~LOC~~ SOPN: 0.5000 mg | PEN_INJECTOR | SUBCUTANEOUS | 3 refills | Status: DC

## 2022-11-25 NOTE — Patient Instructions (Addendum)
-   Start Ozempic 0.25 mg once weekly for 6 weeks, if no nausea increase to 0.5 mg weekly after 6 weeks  - Decrease  Lantus 30 units ONCE daily  - Continue Metformin 1000 mg, 1 tablet with Breakfast and Supper - Continue Farxiga 10 mg, 1 tablet every morning       HOW TO TREAT LOW BLOOD SUGARS (Blood sugar LESS THAN 70 MG/DL) Please follow the RULE OF 15 for the treatment of hypoglycemia treatment (when your (blood sugars are less than 70 mg/dL)   STEP 1: Take 15 grams of carbohydrates when your blood sugar is low, which includes:  3-4 GLUCOSE TABS  OR 3-4 OZ OF JUICE OR REGULAR SODA OR ONE TUBE OF GLUCOSE GEL    STEP 2: RECHECK blood sugar in 15 MINUTES STEP 3: If your blood sugar is still low at the 15 minute recheck --> then, go back to STEP 1 and treat AGAIN with another 15 grams of carbohydrates.

## 2022-11-25 NOTE — Progress Notes (Unsigned)
Name: Randy Castro.  Age/ Sex: 72 y.o., male   MRN/ DOB: [id number], Jan 31, 1951     PCP: Colon Branch, MD   Reason for Endocrinology Evaluation: Type 2 Diabetes Mellitus  Initial Endocrine Consultative Visit: 04/30/2020    PATIENT IDENTIFIER: Randy Castro. is a 72 y.o. male with a past medical history of T2DM, CAD, Dyslipidemia and HTN. The patient has followed with Endocrinology clinic since 04/30/2020 for consultative assistance with management of his diabetes.  DIABETIC HISTORY:  Randy Castro was diagnosed with DM in 1998 , he has been on Janumet,  Glimepiride and Trulicity with reported N/V with Trulicity.Was put on insulin in 2012 following admission for NSTEMI . His hemoglobin A1c has ranged from 7.3% in 2013, peaking at 13.4% in 2019   Pt was established with Dr. Cruzita Lederer from 2013 to 2014   On his initial visit to our clinic his A1c was 9.7 % . We continued Metformin and changed NPH insulin to insulin Mix    Was unable to get the dexcom 06/2021 Farxiga started 06/2021   Switch insulin mix to basal insulin 10/2021   SUBJECTIVE:   During the last visit (05/27/2022): A1c 8.3% .     Today (11/25/2022): Randy Castro is here for a follow up on diabetes.He is accompanied by his wife today.  He has not used his freestyle libre in over a month because he has not been feeling well and tired due to [place] and flu infection   He continues to follow-up with neurology (Dr. Carles Collet) for essential tremors 09/2022 Denies nausea or diarrhea  Constitutes with fatigue and knee pain  He has a pending referral to nephrology    HOME DIABETES REGIMEN:  Lantus 36 units daily  Metformin 1000 mg, 1 tablet with Breakfast and Supper Farxiga 10 mg daily  Ergocalciferol 50,000 IU weekly     Statin: yes ACE-I/ARB: yes    CGM: We attempted to download freestyle libre 2 but the last date was a month ago and there was not a lot of information on the download     DIABETIC  COMPLICATIONS: Microvascular complications:  Neuropathy Denies: CKD, retinopathy Last Eye Exam: Completed 08/2021  Macrovascular complications:  CAD (S/P PCI )  Denies: CVA, PVD   HISTORY:  Past Medical History:  Past Medical History:  Diagnosis Date   Anxiety    Arthritis    BCC (basal cell carcinoma of skin) 12/2020   CAD (coronary artery disease)    a. NSTEMI 12/12: EF 40-45%. [id number] balloon PTCA + Promus DES x 1 to mid LAD;  b. 11/2012: Cath with Cutting balloon PTCA  LAD for ISR to LAD, EF 55%;  c. 12/2012 Cath/PCI: LAD stents patent, 80 ost Diag (jailed)->PTCA, LCX/RCA patent. d. s/p NSTEMI in 11/2021 with DES to D1   Essential hypertension    GI bleed    History of blood transfusion    was getting 4 units   Hyperlipidemia    Ischemic cardiomyopathy    a.  echo 08/06/11: dist ant wall, apical and septal and infero-apical HK, mild LVH, EF 40%, mild LAE, PASP 34, asc aorta mildly dilated, mild TR;  b.  Echo (3/13) showed recovery of LV systolic function with EF 0000000, grade I diastolic dysfunction, mild MR.    Myocardial infarction    twice with NSTEMI 2012   Palpitations    Type 2 diabetes mellitus    Past Surgical History:  Past Surgical History:  Procedure Laterality Date   ANTERIOR CERVICAL DECOMP/DISCECTOMY FUSION  in the 90s   CHOLECYSTECTOMY  03/27/2019   COLONOSCOPY  2021   COLONOSCOPY WITH ESOPHAGOGASTRODUODENOSCOPY (EGD)  10/2019   CORONARY ANGIOPLASTY  12/15/2012; 12/22/2012   CORONARY ANGIOPLASTY WITH STENT PLACEMENT  07/2011   "1" (12/22/2012)   CORONARY STENT INTERVENTION N/A 12/08/2021   Procedure: CORONARY STENT INTERVENTION;  Surgeon: Early Osmond, MD;  Location: [place] CV LAB;  Service: Cardiovascular;  Laterality: N/A;   GASTRIC BYPASS  12/21/2017   KNEE ARTHROSCOPY Left 12/31/2016   LEFT HEART CATH AND CORONARY ANGIOGRAPHY N/A 12/08/2021   Procedure: LEFT HEART CATH AND CORONARY ANGIOGRAPHY;  Surgeon: Early Osmond, MD;  Location: [place] CV LAB;  Service: Cardiovascular;  Laterality: N/A;   LEFT HEART CATHETERIZATION WITH CORONARY ANGIOGRAM N/A 08/06/2011   Procedure: LEFT HEART CATHETERIZATION WITH CORONARY ANGIOGRAM;  Surgeon: Burnell Blanks, MD;  Location: [facility] CATH LAB;  Service: Cardiovascular;  Laterality: N/A;   LEFT HEART CATHETERIZATION WITH CORONARY ANGIOGRAM N/A 12/15/2012   Procedure: LEFT HEART CATHETERIZATION WITH CORONARY ANGIOGRAM;  Surgeon: Sherren Mocha, MD;  Location:  [facility] CATH LAB;  Service: Cardiovascular;  Laterality: N/A;   LEFT HEART CATHETERIZATION WITH CORONARY ANGIOGRAM N/A 12/22/2012   Procedure: LEFT HEART CATHETERIZATION WITH CORONARY ANGIOGRAM;  Surgeon: Sherren Mocha, MD;  Location: [facility] CATH LAB;  Service: Cardiovascular;  Laterality: N/A;   PERCUTANEOUS CORONARY STENT INTERVENTION (PCI-S) Right 12/15/2012   Procedure: PERCUTANEOUS CORONARY STENT INTERVENTION (PCI-S);  Surgeon: Sherren Mocha, MD;  Location: [facility] CATH LAB;  Service: Cardiovascular;  Laterality: Right;   Social History:  reports that he quit smoking about 30 years ago. His smoking use included cigarettes. He has a 25.00 pack-year smoking history. He has never used smokeless tobacco. He reports that he does not currently use alcohol. He reports that he does not use drugs. Family History:  Family History  Problem Relation Age of Onset   Diabetes Brother    Lung cancer Brother    Diabetes Mother    Coronary artery disease Father    Heart attack Father 54   Diabetes Sister    Coronary artery disease Sister    Colon cancer Neg Hx    Prostate cancer Neg Hx    Stroke Neg Hx    Esophageal cancer Neg Hx    Colon polyps Neg Hx    Rectal cancer Neg Hx    Stomach cancer Neg Hx      HOME MEDICATIONS: Allergies as of 11/25/2022       Reactions   Trulicity [dulaglutide] Nausea And Vomiting        Medication List        Accurate as of November 25, 2022 10:09 AM. If you have any questions, ask your nurse or doctor.           aspirin EC 81 MG tablet Take 81 mg by mouth daily.   atorvastatin 80 MG tablet Commonly known as: LIPITOR Take 1 tablet (80 mg total) by mouth at bedtime.   carvedilol 12.5 MG tablet Commonly known as: COREG Take 1 tablet (12.5 mg total) by mouth 2 (two) times daily with a meal.   clopidogrel 75 MG tablet Commonly known as: PLAVIX Take 1 tablet (75 mg total) by mouth daily.   cyanocobalamin 1000 MCG tablet Commonly known as: VITAMIN B12 Take 1,000 mcg by mouth daily.   dapagliflozin propanediol 10 MG Tabs tablet Commonly known as: FARXIGA Take 1 tablet (10 mg total) by mouth  daily.   FeroSul 325 (65 FE) MG tablet Generic drug: ferrous sulfate TAKE 1 TABLET BY MOUTH DAILY   FLUoxetine 40 MG capsule Commonly known as: PROZAC Take 1 capsule (40 mg total) by mouth daily.   gabapentin 300 MG capsule Commonly known as: NEURONTIN Take 1 capsule (300 mg total) by mouth 2 (two) times daily.   Insulin Pen Needle 32G X 4 MM Misc 1 Device by Does not apply route daily in the afternoon.   Lantus SoloStar 100 UNIT/ML Solostar Pen Generic drug: insulin glargine Inject 36 Units into the skin daily.   magnesium oxide 400 MG tablet Commonly known as: MAG-OX Take 1 tablet (400 mg total) by mouth 2 (two) times daily.   metFORMIN 1000 MG tablet Commonly known as: GLUCOPHAGE Take 1 tablet (1,000 mg total) by mouth 2 (two) times daily with a meal.   multivitamin with minerals tablet Take 1 tablet by mouth daily.   nitroGLYCERIN 0.4 MG SL tablet Commonly known as: NITROSTAT Place 1 tablet (0.4 mg total) under the tongue every 5 (five) minutes as needed for chest pain.   ONE TOUCH ULTRA 2 w/Device Kit by Does not apply route.   OneTouch Ultra test strip Generic drug: glucose blood Check blood sugar 2-3 times daily.  Dx Code: E11.9   pantoprazole 40 MG tablet Commonly known as: PROTONIX Take 1 tablet (40 mg total) by mouth daily before breakfast.   Potassium 99 MG  Tabs Take 99 mg by mouth daily.   primidone 50 MG tablet Commonly known as: MYSOLINE TAKE 4 TABLETS BY MOUTH EVERY MORNING and TAKE THREE TABLETS EVERYDAY AT BEDTIME   sacubitril-valsartan 24-26 MG Commonly known as: ENTRESTO Take 1 tablet by mouth 2 (two) times daily.   spironolactone 25 MG tablet Commonly known as: ALDACTONE Take 1 tablet (25 mg total) by mouth daily. What changed: additional instructions         OBJECTIVE:   Vital Signs: BP 128/82   Pulse 83   Ht 6\' 2"  (1.88 m)   Wt 218 lb (98.9 kg)   SpO2 96%   BMI 27.99 kg/m   Wt Readings from Last 3 Encounters:  11/25/22 218 lb (98.9 kg)  09/30/22 211 lb 8 oz (95.9 kg)  09/29/22 216 lb 6.4 oz (98.2 kg)     Exam: General: Pt appears well and is in NAD  Lungs: Clear with good BS bilat with no rales, rhonchi, or wheezes  Heart: RRR   Extremities: No pretibial edema   Neuro: MS is good with appropriate affect, pt is alert and Ox3       DM foot exam:11/25/2022   The skin of the feet is without sores or ulcerations, 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 The sensation is decreased to a screening 5.07, 10 gram monofilament bilaterally  DATA REVIEWED:  Lab Results  Component Value Date   HGBA1C 7.2 (H) 09/30/2022   HGBA1C 8.3 (A) 05/27/2022   HGBA1C 7.8 (A) 11/18/2021     Latest Reference Range & Units 11/25/22 10:32  VITD 30.00 - 100.00 ng/mL 33.24     Latest Reference Range & Units 09/30/22 10:11  Sodium 135 - 145 mEq/L 141  Potassium 3.5 - 5.1 mEq/L 3.9  Chloride 96 - 112 mEq/L 104  CO2 19 - 32 mEq/L 27  Glucose 70 - 99 mg/dL 108 (H)  BUN 6 - 23 mg/dL 8  Creatinine 0.40 -  1.50 mg/dL 0.61  Calcium 8.4 - 10.5 mg/dL 8.8  GFR >60.00 mL/min 96.92    Latest Reference Range & Units 09/30/22 10:12  MICROALB/CREAT RATIO 0.0 - 30.0 mg/g 114.3 (H)     ASSESSMENT / PLAN / RECOMMENDATIONS:   1) Type 2 Diabetes Mellitus,  Sub-Optimally controlled, With neuropathic, and macrovascular  complications and microalbuminuria  - Most recent A1c of 7.2 %. Goal A1c < 7.0 %.    -His A1c is trending down -He has reported intolerance with Trulicity with nausea and vomiting, we discussed alternative therapy with other GLP-1 agonist, caution against GI side effects, patient in agreement to try Ozempic at this time -Will reduce Lantus as below to prevent hypoglycemia -Will continue metformin and Farxiga -A prescription for freestyle libre 3 will be faxed to [facility], to include sensors and a receiver    MEDICATIONS: - Start Ozempic 0.25 mg weekly than increase to 0.5 mg weekly after 6 weeks  - Decrease  Lantus 30 units daily  - Continue Metformin 1000 mg, 1 tablet with Breakfast and Supper - Continue  Farxiga 10 mg daily   EDUCATION / INSTRUCTIONS: BG monitoring instructions: Patient is instructed to check his blood sugars 2 times a day, before breakfast and Supper . Call [place] [facility] if: BG persistently < 70 I reviewed the Rule of 15 for the treatment of hypoglycemia in detail with the patient. Literature supplied.     2) Diabetic complications:  Eye: Does not have known diabetic retinopathy.  Neuro/ Feet: Does  have known diabetic peripheral neuropathy  Renal: Patient does not have known baseline CKD, but has microalbuminuria. He is  on an ACEI/ARB at present.      3) Vitamin D deficiency:   -This has normalized, patient may switch to OTC vitamin D3 2000 IU daily   4) Microalbuminuria: -His glucose control has been improving -He is already on valsartan -He has a pending referral to nephrology   F/U in 6 months    Signed electronically by: Mack Guise, MD  [facility] Endocrinology  [facility] [address] [place], [place] [address] Phone: [phone] FAX: [phone]   CC: Colon Branch, [address] [place]  [address] Phone: [phone]  Fax: [phone]  Return to Endocrinology clinic as below: Future Appointments  Date Time Provider [place]  12/02/2022  9:40 AM Satira Sark, MD [facility]  12/16/2022  8:30 AM [facility] CCM PHARMACIST [facility]  01/29/2023 10:20 AM Colon Branch, MD [facility]  06/15/2023  9:40 AM [facility] ANNUAL WELLNESS VISIT 1 [facility]

## 2022-11-26 ENCOUNTER — Telehealth: Payer: Self-pay | Admitting: Neurology

## 2022-11-26 NOTE — Telephone Encounter (Signed)
Let pt know that [facility] hereditary ataxia panel was neg/nl for any known genetic causes of ataxias.  This is good news.

## 2022-11-26 NOTE — Telephone Encounter (Signed)
Called patient and relayed message and patients wife very grateful for the testing and happy to get the results back

## 2022-11-28 NOTE — Progress Notes (Unsigned)
    Cardiology Office Note  Date: 11/28/2022   Randy: Randy Haws., DOB 23-Feb-1951, MRN Castro  History of Present Illness: Randy Bazan. is a 72 y.o. male last seen in October 2023 by Ms. Strader PA-C, I reviewed the note.  Physical Exam: VS:  There were no vitals taken for this visit., BMI There is no height or weight on file to calculate BMI.  Wt Readings from Last 3 Encounters:  11/25/22 218 lb (98.9 kg)  09/30/22 211 lb 8 oz (95.9 kg)  09/29/22 216 lb 6.4 oz (98.2 kg)    General: Patient appears comfortable at rest. HEENT: Conjunctiva and lids normal, oropharynx clear with moist mucosa. Neck: Supple, no elevated JVP or carotid bruits, no thyromegaly. Lungs: Clear to auscultation, nonlabored breathing at rest. Cardiac: Regular rate and rhythm, no S3 or significant systolic murmur, no pericardial rub. Abdomen: Soft, nontender, no hepatomegaly, bowel sounds present, no guarding or rebound. Extremities: No pitting edema, distal pulses 2+. Skin: Warm and dry. Musculoskeletal: No kyphosis. Neuropsychiatric: Alert and oriented x3, affect grossly appropriate.  ECG:  An ECG dated 12/22/2021 was personally reviewed today and demonstrated:  Sinus rhythm with prolonged PR interval, left bundle branch block, PAC.  Labwork: 12/07/2021: ALT 32; AST 30 12/22/2021: Magnesium 1.8 05/27/2022: Hemoglobin 12.3; Platelets 302.0; TSH 1.61 09/30/2022: BUN 8; Creatinine, Ser 0.61; Potassium 3.9; Sodium 141     Component Value Date/Time   CHOL 110 11/27/2021 0930   TRIG 121.0 11/27/2021 0930   HDL 45.40 11/27/2021 0930   CHOLHDL 2 11/27/2021 0930   VLDL 24.2 11/27/2021 0930   LDLCALC 40 11/27/2021 0930   LDLDIRECT 145.0 04/26/2018 1026   Other Studies Reviewed Today:  Echocardiogram 04/23/2022:  1. Left ventricular ejection fraction, by estimation, is 40 to 45%. The  left ventricle has mildly decreased function. The left ventricle  demonstrates global hypokinesis. There is moderate  concentric left  ventricular hypertrophy. Left ventricular  diastolic parameters are consistent with Grade I diastolic dysfunction  (impaired relaxation).   2. Right ventricular systolic function is normal. The right ventricular  size is normal. There is normal pulmonary artery systolic pressure. The  estimated right ventricular systolic pressure is 15.5 mmHg.   3. Left atrial size was severely dilated.   4. The mitral valve is grossly normal. Mild mitral valve regurgitation.   5. The aortic valve is tricuspid. Aortic valve regurgitation is not  visualized. Aortic valve mean gradient measures 3.0 mmHg.   6. Aortic dilatation noted. There is mild dilatation of the aortic root,  measuring 43 mm.   7. The inferior vena cava is normal in size with greater than 50%  respiratory variability, suggesting right atrial pressure of 3 mmHg.   Assessment and Plan:  1.  CAD status post DES to the mid LAD in 2012, angioplasty for treatment of ISR of the mid LAD in 2014, and DES to the first diagonal in April 2013.  2.  HFmrEF with ischemic cardiomyopathy, LVEF 40 to 45% by echocardiogram in August 2023.  3.  Essential hypertension.  4.  Mixed hyperlipidemia.  5.  Asymptomatic mildly dilated aortic root, 43 mm by echocardiogram in August 2023.  Disposition:  Follow up {follow up:15908}  Signed, Jonelle Sidle, M.D., F.A.C.C. [facility] at [facility]

## 2022-12-01 ENCOUNTER — Encounter: Payer: Self-pay | Admitting: Internal Medicine

## 2022-12-02 ENCOUNTER — Ambulatory Visit: Payer: PPO | Attending: Cardiology | Admitting: Cardiology

## 2022-12-02 ENCOUNTER — Encounter: Payer: Self-pay | Admitting: Cardiology

## 2022-12-02 ENCOUNTER — Telehealth: Payer: Self-pay | Admitting: *Deleted

## 2022-12-02 VITALS — BP 150/72 | HR 69 | Ht 74.0 in | Wt 220.0 lb

## 2022-12-02 DIAGNOSIS — E782 Mixed hyperlipidemia: Secondary | ICD-10-CM

## 2022-12-02 DIAGNOSIS — I2510 Atherosclerotic heart disease of native coronary artery without angina pectoris: Secondary | ICD-10-CM

## 2022-12-02 DIAGNOSIS — I10 Essential (primary) hypertension: Secondary | ICD-10-CM | POA: Diagnosis not present

## 2022-12-02 DIAGNOSIS — I5022 Chronic systolic (congestive) heart failure: Secondary | ICD-10-CM

## 2022-12-02 DIAGNOSIS — I25119 Atherosclerotic heart disease of native coronary artery with unspecified angina pectoris: Secondary | ICD-10-CM | POA: Diagnosis not present

## 2022-12-02 MED ORDER — CLOPIDOGREL BISULFATE 75 MG PO TABS
75.0000 mg | ORAL_TABLET | Freq: Every day | ORAL | 0 refills | Status: DC
Start: 2022-12-02 — End: 2022-12-16

## 2022-12-02 NOTE — Telephone Encounter (Signed)
Per Dr. Drucie Ip to mention to Mr. Echard that he can stop his Plavix at the end of April and otherwise continue regular medical therapy including aspirin.   Patient's wife informed and verbalized understanding of plan.

## 2022-12-02 NOTE — Patient Instructions (Addendum)
Medication Instructions:  Your physician recommends that you continue on your current medications as directed. Please refer to the Current Medication list given to you today.  Labwork: none  Testing/Procedures: Your physician has requested that you have an echocardiogram in 6 months just before your next visit. Echocardiography is a painless test that uses sound waves to create images of your heart. It provides your doctor with information about the size and shape of your heart and how well your heart's chambers and valves are working. This procedure takes approximately one hour. There are no restrictions for this procedure. Please do NOT wear cologne, perfume, aftershave, or lotions (deodorant is allowed). Please arrive 15 minutes prior to your appointment time.  Follow-Up: Your physician recommends that you schedule a follow-up appointment in: 6 months  Any Other Special Instructions Will Be Listed Below (If Applicable).  If you need a refill on your cardiac medications before your next appointment, please call your pharmacy. 

## 2022-12-07 DIAGNOSIS — E1165 Type 2 diabetes mellitus with hyperglycemia: Secondary | ICD-10-CM | POA: Diagnosis not present

## 2022-12-15 ENCOUNTER — Other Ambulatory Visit (HOSPITAL_COMMUNITY): Payer: Self-pay

## 2022-12-16 ENCOUNTER — Telehealth: Payer: Self-pay

## 2022-12-16 ENCOUNTER — Encounter: Payer: Self-pay | Admitting: Pharmacist

## 2022-12-16 ENCOUNTER — Ambulatory Visit (INDEPENDENT_AMBULATORY_CARE_PROVIDER_SITE_OTHER): Payer: PPO | Admitting: Pharmacist

## 2022-12-16 DIAGNOSIS — E559 Vitamin D deficiency, unspecified: Secondary | ICD-10-CM

## 2022-12-16 DIAGNOSIS — I5022 Chronic systolic (congestive) heart failure: Secondary | ICD-10-CM

## 2022-12-16 DIAGNOSIS — I10 Essential (primary) hypertension: Secondary | ICD-10-CM

## 2022-12-16 DIAGNOSIS — Z794 Long term (current) use of insulin: Secondary | ICD-10-CM

## 2022-12-16 DIAGNOSIS — E1165 Type 2 diabetes mellitus with hyperglycemia: Secondary | ICD-10-CM

## 2022-12-16 NOTE — Progress Notes (Signed)
Pharmacy Note  12/16/2022 Name: Randy Castro. MRN: [id number] DOB: 06/11/1951  Subjective: Randy Castro. is a 72 y.o. year old male who is a primary care patient of [name], MD. Clinical Pharmacist Practitioner referral was placed to assist with medication management.    Engaged with patient by telephone for follow up visit today.  Type 2 DM:  Last A1c was 7.2%.  Managed by [name]  - at last visit Ozempic was started - 0.25mg  weekly for 4 weeks, then increase to 0.5mg  weekly thereafter. Dose of Lantus was decreased to 30 units daily.  He also takes [place] 10mg  daily and metformin 1000mg  twice a day.  Patient has been using Continuous Glucose Monitor to check blood glucose. Reports blood glucose monstly in the 120's. Denies any blood glucose readings < 70.   Eye Exam - last was 2022 at [facility] / HTN:  Current therapy: Entresto 24/26mg  twice a day, Farxiga 10mg  daily, carvedilol 12.5mg  twice a day and spironolactone 25mg  was increased to 1 tablet daily 05/27/2022 but he continues to get 0.5 tablet daily in his adherence packaging.   Adherence with [name] improved since qualified he for [facility]. Cost of [name] is current $0. EF 04/23/2022 was 40-45% which was improved from 35-40%. Denied edema or shortness of breath.  Last follow up with cardiologist 12/02/2022. Dr [name] ordered ECHO.  Noted elevated blood pressure. Was to check blood pressure at home and report back to cardiology office but patient's wife reports they have not been checking blood pressure at home. They do have a blood pressure cuff.  Also verified with patient and pharmacy that he will stop clopidogrel at the end of this month per Dr [name]. recommendation. He will continue aspirin 81mg  daily.   Low vitamin D:  Noted that serum vitamin D was low 05/27/2022 at 18.29. [name] rechecked 11/25/2022 after 6 months of vitamin D 50,000units weekly and serum vitamin had  returned to normal range.   Last vitamin D Lab Results  Component Value Date   VD25OH 33.24 11/25/2022    Medication management:  Continues to use adhernece packaging with good results.  He has a Clinical research associate for CHF medications to cover cost of [place]. [name] is active thru 01/2023.    SDOH (Social Determinants of Health) assessments and interventions performed:  SDOH Interventions    Flowsheet Row Office Visit from 09/01/2022 in [facility] [place] [facility] at [facility] Chronic Care Management from 05/05/2022 in [facility] Primary Care at [facility] CARDIAC REHAB PHASE II EXERCISE from 04/03/2022 in [facility] Chronic Care Management from 02/16/2022 in [facility] Primary Care at [facility] CARDIAC REHAB PHASE II ORIENTATION from 01/06/2022 in [facility] CARDIAC REHABILITATION Office Visit from 10/21/2021 in [facility] Primary Care at [facility]  SDOH Interventions        Depression Interventions/Treatment  -- -- Currently on Treatment, Medication -- Medication, Currently on Treatment Counseling, Currently on Treatment  Financial Strain Interventions -- -- -- Other (Comment)  [Patient is getting Farxiga from MAP,  assisting with completion of MAP application for Entresto today.] -- --  Physical Activity Interventions Other (Comments)  [discussed getting 150 minutes per week] Other (Comments)  [patient and wife encouraged and planning in increase frequency of exerice over the next few months.] -- -- -- --        Objective: Review  of patient status, including review of consultants reports, laboratory and other test data, was performed as part of comprehensive.  Lab Results  Component Value Date   CREATININE 0.61 09/30/2022   CREATININE 0.91 05/27/2022   CREATININE 0.84 01/20/2022    Lab Results  Component Value Date   HGBA1C 7.2 (H) 09/30/2022       Component  Value Date/Time   CHOL 110 11/27/2021 0930   TRIG 121.0 11/27/2021 0930   HDL 45.40 11/27/2021 0930   CHOLHDL 2 11/27/2021 0930   VLDL 24.2 11/27/2021 0930   LDLCALC 40 11/27/2021 0930   LDLDIRECT 145.0 04/26/2018 1026     Clinical ASCVD: Yes  The ASCVD Risk score (Arnett DK, et al., 2019) failed to calculate for the following reasons:   The patient has a prior MI or stroke diagnosis    BP Readings from Last 3 Encounters:  12/02/22 (!) 150/72  11/25/22 128/82  09/30/22 126/80     Allergies  Allergen Reactions   Trulicity [Dulaglutide] Nausea And Vomiting    Medications Reviewed Today     Reviewed by Jonelle Sidle, MD (Physician) on 12/02/22 at [phone]  Med List Status: <None>   Medication Order Taking? Sig Documenting Provider Last Dose Status Informant  aspirin EC 81 MG tablet [id number] Yes Take 81 mg by mouth daily. [provider] Taking Active Spouse/Significant Other  atorvastatin (LIPITOR) 80 MG tablet [id number] Yes Take 1 tablet (80 mg total) by mouth at bedtime. [name], MD Taking Active   Blood Glucose Monitoring Suppl (ONE TOUCH ULTRA 2) w/Device KIT [id number] Yes by Does not apply route. [provider] Taking Active Spouse/Significant Other  carvedilol (COREG) 12.5 MG tablet [id number] Yes Take 1 tablet (12.5 mg total) by mouth 2 (two) times daily with a meal. [name], MD Taking Active   clopidogrel (PLAVIX) 75 MG tablet [id number] Yes Take 1 tablet (75 mg total) by mouth daily. Alver Sorrow, NP Taking Active Spouse/Significant Other  Continuous Blood Gluc Receiver (FREESTYLE LIBRE 3 READER) DEVI [id number] Yes 1 Device by Does not apply route every 14 (fourteen) days. Shamleffer, Konrad Dolores, MD Taking Active   Continuous Blood Gluc Sensor (FREESTYLE LIBRE 3 SENSOR) [place] [id number] Yes 1 Device by Does not apply route every 14 (fourteen) days. Shamleffer, Konrad Dolores, MD Taking Active   dapagliflozin propanediol (FARXIGA) 10 MG  TABS tablet [id number] Yes Take 1 tablet (10 mg total) by mouth daily. Shamleffer, Konrad Dolores, MD Taking Active   FEROSUL 325 [phone] Fe) MG tablet [id number] Yes TAKE 1 TABLET BY MOUTH DAILY Cirigliano, Vito V, DO Taking Active Spouse/Significant Other  FLUoxetine (PROZAC) 40 MG capsule [id number] Yes Take 1 capsule (40 mg total) by mouth daily. [name], MD Taking Active   gabapentin (NEURONTIN) 300 MG capsule [id number] Yes Take 1 capsule (300 mg total) by mouth 2 (two) times daily. [name], MD Taking Active   glucose blood [facility] ULTRA) test strip [id number] Yes Check blood sugar 2-3 times daily.  Dx Code: E11.9 [name], MD Taking Active Spouse/Significant Other  insulin glargine (LANTUS SOLOSTAR) 100 UNIT/ML Solostar Pen [id number] Yes Inject 36 Units into the skin daily. Shamleffer, Konrad Dolores, MD Taking Active   Insulin Pen Needle 32G X 4 MM MISC [id number] Yes 1 Device by Does not apply route daily in the afternoon. Shamleffer, Konrad Dolores, MD Taking Active   magnesium oxide (MAG-OX) 400 MG tablet [id number] Yes Take 1 tablet (400 mg total) by mouth  2 (two) times daily. [name], MD Taking Active   metFORMIN (GLUCOPHAGE) 1000 MG tablet [id number] Yes Take 1 tablet (1,000 mg total) by mouth 2 (two) times daily with a meal. Shamleffer, Konrad Dolores, MD Taking Active   Multiple Vitamins-Minerals (MULTIVITAMIN WITH MINERALS) tablet [id number] Yes Take 1 tablet by mouth daily. [provider] Taking Active Spouse/Significant Other  nitroGLYCERIN (NITROSTAT) 0.4 MG SL tablet [id number] Yes Place 1 tablet (0.4 mg total) under the tongue every 5 (five) minutes as needed for chest pain. Alver Sorrow, NP Taking Active Spouse/Significant Other           Med Note Desma Paganini M   Wed Dec 02, 2022  9:40 AM)    pantoprazole (PROTONIX) 40 MG tablet [id number] Yes Take 1 tablet (40 mg total) by mouth daily before breakfast. [name], MD Taking Active   Potassium 99 MG TABS  [id number] Yes Take 99 mg by mouth daily. [provider] Taking Active Spouse/Significant Other  primidone (MYSOLINE) 50 MG tablet [id number] Yes TAKE 4 TABLETS BY MOUTH EVERY MORNING and TAKE THREE TABLETS EVERYDAY AT BEDTIME [name], Octaviano Batty, DO Taking Active   sacubitril-valsartan (ENTRESTO) 24-26 MG [id number] Yes Take 1 tablet by mouth 2 (two) times daily. [name], MD Taking Active   Semaglutide,0.25 or 0.5MG /DOS, 2 MG/3ML Namon Cirri [id number] Yes Inject 0.5 mg into the skin once a week. Shamleffer, Konrad Dolores, MD Taking Active   spironolactone (ALDACTONE) 25 MG tablet [id number] Yes Take 1 tablet (25 mg total) by mouth daily.  Patient taking differently: Take 25 mg by mouth daily. Patient is only taking half due to pharmacy error   Ellsworth Lennox, PA-C Taking Active   vitamin B-12 (CYANOCOBALAMIN) 1000 MCG tablet [id number] Yes Take 1,000 mcg by mouth daily. 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   Abdominal pain 11/17/2018   Cardiac arrhythmia, unspecified 11/14/2017   Hypomagnesemia 11/14/2017   Anemia 11/14/2017   OSA on CPAP 09/21/2017   Obesity 01/23/2016   PCP NOTES >>> 06/08/2015   Anxiety and depression 05/31/2013   Intermediate coronary syndrome 12/22/2012   Unstable angina 12/16/2012   Fatigue 04/26/2012   Annual physical exam 10/28/2011   Rash 10/28/2011   Coronary Artery Disease 08/28/2011   Ischemic Cardiomyopathy  08/28/2011   Palpitations 08/28/2011   NSTEMI (non-ST elevated myocardial infarction) 08/07/2011   DJD (degenerative joint disease) 05/28/2011   Poorly controlled type 2 diabetes mellitus with circulatory disorder    Hyperlipidemia    HTN (hypertension)      Medication Assistance:   Marcelline Deist and Entresto obtained through [employer]  medication assistance program.  Enrollment ends 01/2023   Assessment / Plan: Type 2 DM: A1c improving - goal < 7.5% Continue Lantus to 30 units daily, Farxiga 10mg  daily and metformin 1000mg  twice a day. Checked on prior authorization for Ozempic and per [facility] prior authorization still has not been approved.  Will reach out to  endocrinology office regarding prior authorization.  Continue to use Continuous Glucose Monitor to check blood glucose and monitor blood glucose trends.  Eye Exam - last was 2022 at [facility]. Reminded patient to scheduled annual eye exam and discuss importance of yearly eye exams for diabetic patient.   HFmrEF / HTN:  HF controlled but last blood pressure elevated Continue spironolactone, carvedilol, Farxiga and Entresto  Restart checking blood pressure 2 or 3 times per week and report to Dr [name] or our office. If blood pressure at home is above goal of < 130/80 consider increasing spironolactone to full  daily or could increase either carvedilol or Entresto.   Low vitamin D: resolved Recommended vitamin D 2000 units daily   Medication management:  Continue to use adhernece packaging  Reviewed and updated medication list Reviewed refill history and adherence    Health Maintenance:  Reminded patient to scheduled annual eye exam and discuss importance of yearly eye exams for diabetic patient.    Follow Up:  Telephone follow up appointment with care management team member scheduled for:  June 2024.   Henrene Pastor, PharmD Clinical Pharmacist [facility] Primary Care  - [facility] [phone]

## 2022-12-16 NOTE — Telephone Encounter (Signed)
Patient needs PA on Ozempic. He has used all of the samples given.

## 2022-12-16 NOTE — Telephone Encounter (Signed)
Patient Advocate Encounter   Received notification from pt msgs that prior authorization is required for ozempic  Submitted: 12/16/22 Key [id number]  Status is pending

## 2022-12-16 NOTE — Telephone Encounter (Signed)
Patient will stop by and pick up sample of Ozempic. 

## 2022-12-21 DIAGNOSIS — R809 Proteinuria, unspecified: Secondary | ICD-10-CM | POA: Diagnosis not present

## 2022-12-21 DIAGNOSIS — I129 Hypertensive chronic kidney disease with stage 1 through stage 4 chronic kidney disease, or unspecified chronic kidney disease: Secondary | ICD-10-CM | POA: Diagnosis not present

## 2022-12-21 DIAGNOSIS — E1129 Type 2 diabetes mellitus with other diabetic kidney complication: Secondary | ICD-10-CM | POA: Diagnosis not present

## 2022-12-21 LAB — PROTEIN / CREATININE RATIO, URINE: Creatinine, Urine: 89.6

## 2022-12-21 NOTE — Telephone Encounter (Signed)
Patient came in to office today and picked up sample of 1 box of Ozempic.

## 2022-12-22 LAB — LAB REPORT - SCANNED: Creatinine, POC: 89.6 mg/dL

## 2022-12-23 NOTE — Telephone Encounter (Signed)
Pharmacy Patient Advocate Encounter  Prior Authorization has been approved  Effective dates: 12/22/22 through 12/22/23

## 2022-12-25 ENCOUNTER — Encounter: Payer: Self-pay | Admitting: Internal Medicine

## 2023-01-07 ENCOUNTER — Other Ambulatory Visit: Payer: Self-pay | Admitting: Internal Medicine

## 2023-01-08 ENCOUNTER — Other Ambulatory Visit (HOSPITAL_BASED_OUTPATIENT_CLINIC_OR_DEPARTMENT_OTHER): Payer: Self-pay | Admitting: Internal Medicine

## 2023-01-08 DIAGNOSIS — I5022 Chronic systolic (congestive) heart failure: Secondary | ICD-10-CM

## 2023-01-11 ENCOUNTER — Telehealth: Payer: Self-pay | Admitting: Internal Medicine

## 2023-01-11 MED ORDER — FLUOXETINE HCL 40 MG PO CAPS: 40.0000 mg | ORAL_CAPSULE | Freq: Every day | ORAL | 1 refills | Status: DC

## 2023-01-11 NOTE — Telephone Encounter (Signed)
Prescription Request  01/11/2023  Is this a "Controlled Substance" medicine? No  LOV: 09/30/2022  What is the name of the medication or equipment?   FLUoxetine (PROZAC) 40 MG capsule [[id number]]   Have you contacted your pharmacy to request a refill? Yes   Which pharmacy would you like this sent to?   [facility] - [place], [place] - [address] [place] [address] Phone: [phone] Fax: [phone]  Patient notified that their request is being sent to the clinical staff for review and that they should receive a response within 2 business days.   Please advise at Mobile [phone] (mobile)

## 2023-01-11 NOTE — Addendum Note (Signed)
Addended byConrad Shoreline D on: 01/11/2023 03:24 PM   Modules accepted: Orders

## 2023-01-11 NOTE — Telephone Encounter (Signed)
Rx sent 

## 2023-01-29 ENCOUNTER — Ambulatory Visit (INDEPENDENT_AMBULATORY_CARE_PROVIDER_SITE_OTHER): Payer: PPO | Admitting: Internal Medicine

## 2023-01-29 ENCOUNTER — Encounter: Payer: Self-pay | Admitting: Internal Medicine

## 2023-01-29 VITALS — BP 130/68 | HR 71 | Temp 98.1°F | Resp 16 | Ht 74.0 in | Wt 216.2 lb

## 2023-01-29 DIAGNOSIS — I2510 Atherosclerotic heart disease of native coronary artery without angina pectoris: Secondary | ICD-10-CM | POA: Diagnosis not present

## 2023-01-29 DIAGNOSIS — I10 Essential (primary) hypertension: Secondary | ICD-10-CM | POA: Diagnosis not present

## 2023-01-29 DIAGNOSIS — H539 Unspecified visual disturbance: Secondary | ICD-10-CM | POA: Diagnosis not present

## 2023-01-29 DIAGNOSIS — E7800 Pure hypercholesterolemia, unspecified: Secondary | ICD-10-CM

## 2023-01-29 LAB — LIPID PANEL
Cholesterol: 117 mg/dL (ref 0–200)
HDL: 43.3 mg/dL (ref >39.00)
NonHDL: 73.31
Total CHOL/HDL Ratio: 3
Triglycerides: 321 mg/dL — ABNORMAL HIGH (ref 0.0–149.0)
VLDL: 64.2 mg/dL — ABNORMAL HIGH (ref 0.0–40.0)

## 2023-01-29 LAB — CBC WITH DIFFERENTIAL/PLATELET
Basophils Absolute: 0.1 10*3/uL (ref 0.0–0.1)
Basophils Relative: 0.9 % (ref 0.0–3.0)
Eosinophils Absolute: 0.3 10*3/uL (ref 0.0–0.7)
Eosinophils Relative: 3.5 % (ref 0.0–5.0)
HCT: 38.2 % — ABNORMAL LOW (ref 39.0–52.0)
Hemoglobin: 12.3 g/dL — ABNORMAL LOW (ref 13.0–17.0)
Lymphocytes Relative: 15 % (ref 12.0–46.0)
Lymphs Abs: 1.3 10*3/uL (ref 0.7–4.0)
MCHC: 32.2 g/dL (ref 30.0–36.0)
MCV: 85.3 fl (ref 78.0–100.0)
Monocytes Absolute: 0.7 10*3/uL (ref 0.1–1.0)
Monocytes Relative: 8.4 % (ref 3.0–12.0)
Neutro Abs: 6.3 10*3/uL (ref 1.4–7.7)
Neutrophils Relative %: 72.2 % (ref 43.0–77.0)
Platelets: 299 10*3/uL (ref 150.0–400.0)
RBC: 4.48 Mil/uL (ref 4.22–5.81)
RDW: 16.4 % — ABNORMAL HIGH (ref 11.5–15.5)
WBC: 8.7 10*3/uL (ref 4.0–10.5)

## 2023-01-29 LAB — BASIC METABOLIC PANEL
BUN: 16 mg/dL (ref 6–23)
CO2: 29 mEq/L (ref 19–32)
Calcium: 8.9 mg/dL (ref 8.4–10.5)
Chloride: 104 mEq/L (ref 96–112)
Creatinine, Ser: 0.85 mg/dL (ref 0.40–1.50)
GFR: 87.47 mL/min (ref >60.00)
Glucose, Bld: 114 mg/dL — ABNORMAL HIGH (ref 70–99)
Potassium: 4.3 mEq/L (ref 3.5–5.1)
Sodium: 141 mEq/L (ref 135–145)

## 2023-01-29 LAB — AST: AST: 25 U/L (ref 0–37)

## 2023-01-29 LAB — LDL CHOLESTEROL, DIRECT: Direct LDL: 50 mg/dL

## 2023-01-29 LAB — ALT: ALT: 32 U/L (ref 0–53)

## 2023-01-29 NOTE — Patient Instructions (Addendum)
Check the  blood pressure regularly BP GOAL is between 110/65 and  135/85. If it is consistently higher or lower, let me know  Will schedule a carotid ultrasound  GO TO THE LAB : Get the blood work     GO TO THE FRONT DESK, PLEASE SCHEDULE YOUR APPOINTMENTS Come back for   a physical exam in 4 months      Vaccines I recommend: Tdap (tetanus) RSV vaccine  Per our records you are due for your diabetic eye exam. Please contact your eye doctor to schedule an appointment. Please have them send copies of your office visit notes to [place]. Our fax number is [phone]. If you need a referral to an eye doctor please let us know.

## 2023-01-29 NOTE — Assessment & Plan Note (Addendum)
DM: Per Endo.  Reports he is intolerant to semaglutide due to nausea, encouraged to discuss with endocrinology. CAD, CHF:  Saw cardiology April 2024.  Was recommended to stop Plavix May 2024 and continue aspirin.  Last EF 45% per echo 03-2022.  No other changes made. HTN: BP today is very good, recommend to check amb BPs from time to time, continue carvedilol, Entresto, Aldactone (dose?  recommend to let us know), check BMP. Hyperlipidemia: On atorvastatin, checking AST ALT FLP Visual disturbances: Headache and  "spots before my eyes" when he looks up for a while, as described above; etiology not completely clear but he is high risk for carotid disease, check ultrasound RTC 4 months CPX

## 2023-01-29 NOTE — Progress Notes (Addendum)
Subjective:    Patient ID: Randy Castro., male    DOB: 10-10-1950, 72 y.o.   MRN: [id number]  DOS:  01/29/2023 Type of visit - description: Follow-up  Since LOV, saw endocrinology and cardiology.  Notes reviewed.  The patient tells me that in general is feeling well other than  feeling occasionally tired. No chest pain no difficulty breathing.  No lower extremity edema.  Also reports that  whenever he "looks up for a while"  he gets a headache and "black spots before the eyes".  The headache lasts for a minute or 2, and the black spots for 3 to 4 minutes. Denies LOC.  No slurred speech, focal deficits, dizziness or neck pain.   Review of Systems See above   Past Medical History:  Diagnosis Date   Anxiety    Arthritis    BCC (basal cell carcinoma of skin) 12/2020   CAD (coronary artery disease)    a. NSTEMI 12/12: EF 40-45%. VHQ:IONGEXB balloon PTCA + Promus DES x 1 to mid LAD;  b. 11/2012: Cath with Cutting balloon PTCA  LAD for ISR to LAD, EF 55%;  c. 12/2012 Cath/PCI: LAD stents patent, 80 ost Diag (jailed)->PTCA, LCX/RCA patent. d. s/p NSTEMI in 11/2021 with DES to D1   Essential hypertension    GI bleed    History of blood transfusion    was getting 4 units   Hyperlipidemia    Ischemic cardiomyopathy    a.  echo 08/06/11: dist ant wall, apical and septal and infero-apical HK, mild LVH, EF 40%, mild LAE, PASP 34, asc aorta mildly dilated, mild TR;  b.  Echo (3/13) showed recovery of LV systolic function with EF 55-60%, grade I diastolic dysfunction, mild MR.    Myocardial infarction [facility])    twice with NSTEMI 2012   Palpitations    Type 2 diabetes mellitus [facility])     Past Surgical History:  Procedure Laterality Date   ANTERIOR CERVICAL DECOMP/DISCECTOMY FUSION  in the 90s   CHOLECYSTECTOMY  03/27/2019   COLONOSCOPY  2021   COLONOSCOPY WITH ESOPHAGOGASTRODUODENOSCOPY (EGD)  10/2019   CORONARY ANGIOPLASTY  12/15/2012; 12/22/2012   CORONARY ANGIOPLASTY WITH STENT PLACEMENT   07/2011   "1" (12/22/2012)   CORONARY STENT INTERVENTION N/A 12/08/2021   Procedure: CORONARY STENT INTERVENTION;  Surgeon: Orbie Pyo, MD;  Location: [facility] INVASIVE CV LAB;  Service: Cardiovascular;  Laterality: N/A;   GASTRIC BYPASS  12/21/2017   KNEE ARTHROSCOPY Left 12/31/2016   LEFT HEART CATH AND CORONARY ANGIOGRAPHY N/A 12/08/2021   Procedure: LEFT HEART CATH AND CORONARY ANGIOGRAPHY;  Surgeon: Orbie Pyo, MD;  Location: [facility] INVASIVE CV LAB;  Service: Cardiovascular;  Laterality: N/A;   LEFT HEART CATHETERIZATION WITH CORONARY ANGIOGRAM N/A 08/06/2011   Procedure: LEFT HEART CATHETERIZATION WITH CORONARY ANGIOGRAM;  Surgeon: Kathleene Hazel, MD;  Location: [facility] CATH LAB;  Service: Cardiovascular;  Laterality: N/A;   LEFT HEART CATHETERIZATION WITH CORONARY ANGIOGRAM N/A 12/15/2012   Procedure: LEFT HEART CATHETERIZATION WITH CORONARY ANGIOGRAM;  Surgeon: Tonny Bollman, MD;  Location: [facility] CATH LAB;  Service: Cardiovascular;  Laterality: N/A;   LEFT HEART CATHETERIZATION WITH CORONARY ANGIOGRAM N/A 12/22/2012   Procedure: LEFT HEART CATHETERIZATION WITH CORONARY ANGIOGRAM;  Surgeon: Tonny Bollman, MD;  Location: [facility] CATH LAB;  Service: Cardiovascular;  Laterality: N/A;   PERCUTANEOUS CORONARY STENT INTERVENTION (PCI-S) Right 12/15/2012   Procedure: PERCUTANEOUS CORONARY STENT INTERVENTION (PCI-S);  Surgeon: Tonny Bollman, MD;  Location: [facility] CATH LAB;  Service: Cardiovascular;  Laterality: Right;    Current Outpatient Medications  Medication Instructions   aspirin EC 81 mg, Oral, Daily   atorvastatin (LIPITOR) 80 mg, Oral, Daily at bedtime   Blood Glucose Monitoring Suppl (ONE TOUCH ULTRA 2) w/Device KIT Does not apply   carvedilol (COREG) 12.5 mg, Oral, 2 times daily with meals   Continuous Blood Gluc Receiver (FREESTYLE LIBRE 3 READER) DEVI 1 Device, Does not apply, Every 14 days   Continuous Blood Gluc Sensor (FREESTYLE LIBRE 3 SENSOR) MISC 1 Device, Does not apply, Every 14  days   cyanocobalamin (VITAMIN B12) 1,000 mcg, Oral, Daily   dapagliflozin propanediol (FARXIGA) 10 mg, Oral, Daily   FEROSUL 325 (65 Fe) MG tablet TAKE 1 TABLET BY MOUTH DAILY   FLUoxetine (PROZAC) 40 mg, Oral, Daily   gabapentin (NEURONTIN) 300 mg, Oral, 2 times daily   glucose blood (ONETOUCH ULTRA) test strip Check blood sugar 2-3 times daily.  Dx Code: E11.9   Insulin Pen Needle 32G X 4 MM MISC 1 Device, Does not apply, Daily   Lantus SoloStar 36 Units, Subcutaneous, Daily   magnesium oxide (MAG-OX) 400 mg, Oral, 2 times daily   metFORMIN (GLUCOPHAGE) 1,000 mg, Oral, 2 times daily with meals   Multiple Vitamins-Minerals (MULTIVITAMIN WITH MINERALS) tablet 1 tablet, Oral, Daily   nitroGLYCERIN (NITROSTAT) 0.4 mg, Sublingual, Every 5 min PRN   pantoprazole (PROTONIX) 40 mg, Oral, Daily before breakfast   Potassium 99 mg, Oral, Daily   primidone (MYSOLINE) 50 MG tablet TAKE 4 TABLETS BY MOUTH EVERY MORNING and TAKE THREE TABLETS EVERYDAY AT BEDTIME   sacubitril-valsartan (ENTRESTO) 24-26 MG 1 tablet, Oral, 2 times daily   Semaglutide(0.25 or 0.5MG /DOS) 0.5 mg, Subcutaneous, Weekly   spironolactone (ALDACTONE) 25 mg, Oral, Daily   vitamin D3 2,000 Units, Oral, Daily       Objective:   Physical Exam BP 130/68   Pulse 71   Temp 98.1 F (36.7 C) (Oral)   Resp 16   Ht 6\' 2"  (1.88 m)   Wt 216 lb 4 oz (98.1 kg)   SpO2 98%   BMI 27.76 kg/m  General:   Well developed, NAD, BMI noted. HEENT:  Normocephalic . Face symmetric, atraumatic Neck: Normal carotid pulses, no bruit.  Range of motion is normal. Lungs:  CTA B Normal respiratory effort, no intercostal retractions, no accessory muscle use. Heart: RRR,  no murmur.  Lower extremities: no pretibial edema bilaterally  Skin: Not pale. Not jaundice Neurologic:  alert & oriented X3.  Speech normal, gait appropriate for age and unassisted. EOMI, face symmetric, motor symmetric. Psych--  Cognition and judgment appear intact.   Cooperative with normal attention span and concentration.  Behavior appropriate. No anxious or depressed appearing.      Assessment     Assessment   DM : insulin dependent,  w/ CAD CHF. Intol Trulicity, per endo Neuropathy, severe, documented by NCS 2022 HTN Hyperlipidemia CAD --  NSTEMI in 07/2011; unstable angina in 11/2012 and 12/2012. NSTMI [id number] .  CHF, ischemic cardiomyopathy, PVCs (Holter 2019 show 13.6% burden) OSA dx ~08-2017, on Cpap Anxiety-depression Essential Tremor dx 2019  Skin cancer: Morbid obesity:gastric sleeve, 11-2017 Dr Buzzy Han 09-2019 GI bleed, gastric ulcer, required transfusion B12 deficiency Dx 2022 Vitamin D deficiency Dx 05-2022  PLAN DM: Per Endo.  Reports he is intolerant to semaglutide due to nausea, encouraged to discuss with endocrinology. CAD, CHF:  Saw cardiology April 2024.  Was recommended to stop Plavix May 2024 and continue aspirin.  Last EF  45% per echo 03-2022.  No other changes made. HTN: BP today is very good, recommend to check amb BPs from time to time, continue carvedilol, Entresto, Aldactone (dose?  recommend to let us know), check BMP. Hyperlipidemia: On atorvastatin, checking AST ALT FLP Visual disturbances: Headache and  "spots before my eyes" when he looks up for a while, as described above; etiology not completely clear but he is high risk for carotid disease, check ultrasound RTC 4 months CPX

## 2023-02-03 ENCOUNTER — Ambulatory Visit (INDEPENDENT_AMBULATORY_CARE_PROVIDER_SITE_OTHER): Payer: PPO | Admitting: Pharmacist

## 2023-02-03 DIAGNOSIS — I10 Essential (primary) hypertension: Secondary | ICD-10-CM

## 2023-02-03 DIAGNOSIS — I5022 Chronic systolic (congestive) heart failure: Secondary | ICD-10-CM

## 2023-02-03 DIAGNOSIS — Z794 Long term (current) use of insulin: Secondary | ICD-10-CM

## 2023-02-03 DIAGNOSIS — E1165 Type 2 diabetes mellitus with hyperglycemia: Secondary | ICD-10-CM

## 2023-02-03 DIAGNOSIS — I2510 Atherosclerotic heart disease of native coronary artery without angina pectoris: Secondary | ICD-10-CM

## 2023-02-03 NOTE — Progress Notes (Signed)
Pharmacy Note  02/03/2023 Name: Randy Castro. MRN: [id number] DOB: 08/05/1951  Subjective: Randy Castro. is a 72 y.o. year old male who is a primary care patient of [name], MD. Clinical Pharmacist Practitioner referral was placed to assist with medication management.    Engaged with patient by telephone for follow up visit today.  Type 2 DM:  Last A1c was 7.2%.  Managed by [name]  - at last visit Ozempic was started - 0.25mg  weekly for 4 weeks, then increase to 0.5mg  weekly thereafter. However patient stopped Ozempic due to severe nausea and weakness.  He restarted previous dose of Lantus which was 36 units daily.  He also takes [place] 10mg  daily and metformin 1000mg  twice a day.  Patient has been using Continuous Glucose Monitor to check blood glucose. Reports blood glucose monstly 115 to 150. Denies any blood glucose readings < 70.    HFmrEF / HTN:  Current therapy: Entresto 24/26mg  twice a day, Farxiga 10mg  daily, carvedilol 12.5mg  twice a day and spironolactone 25mg  1 tablet daily  Adherence with Entresto improved since qualified he for [employer]. Cost of [name] is current $0. EF 04/23/2022 was 40-45% which was improved from 35-40%. Denies edema or shortness of breath.  Last follow up with cardiologist 12/02/2022. Dr [name] ordered ECHO prior to next visit. ECHO scheduled for 06/03/2023.   Has blood pressure cuff at home.    Medication management:  Continues to use adhernece packaging with good results.  He has a Clinical research associate for CHF medications to cover cost of [place]. [name] is active thru 02/13/2023.    SDOH (Social Determinants of Health) assessments and interventions performed:  SDOH Interventions    Flowsheet Row Office Visit from 09/01/2022 in [facility] at [facility] Chronic Care Management from 05/05/2022 in [facility] Primary Care at [facility]  CARDIAC REHAB PHASE II EXERCISE from 04/03/2022 in [facility] CARDIAC REHABILITATION Chronic Care Management from 02/16/2022 in [facility] Primary Care at [facility] CARDIAC REHAB PHASE II ORIENTATION from 01/06/2022 in [facility] CARDIAC REHABILITATION Office Visit from 10/21/2021 in [facility] Primary Care at [facility]  SDOH Interventions        Depression Interventions/Treatment  -- -- Currently on Treatment, Medication -- Medication, Currently on Treatment Counseling, Currently on Treatment  Financial Strain Interventions -- -- -- Other (Comment)  [Patient is getting Farxiga from MAP,  assisting with completion of MAP application for Entresto today.] -- --  Physical Activity Interventions Other (Comments)  [discussed getting 150 minutes per week] Other (Comments)  [patient and wife encouraged and planning in increase frequency of exerice over the next few months.] -- -- -- --        Objective: Review of patient status, including review of consultants reports, laboratory and other test data, was performed as part of comprehensive.  Lab Results  Component Value Date   CREATININE 0.85 01/29/2023   CREATININE 0.61 09/30/2022   CREATININE 0.91 05/27/2022    Lab Results  Component Value Date   HGBA1C 7.2 (H) 09/30/2022       Component Value Date/Time   CHOL 117 01/29/2023 1029   TRIG 321.0 (H) 01/29/2023 1029   HDL 43.30 01/29/2023 1029   CHOLHDL 3 01/29/2023 1029   VLDL 64.2 (H) 01/29/2023 1029   LDLCALC 40 11/27/2021 0930   LDLDIRECT 50.0 01/29/2023 1029     Clinical  ASCVD: Yes  The ASCVD Risk score (Arnett DK, et al., 2019) failed to calculate for the following reasons:   The patient has a prior MI or stroke diagnosis    BP Readings from Last 3 Encounters:  01/29/23 130/68  12/02/22 (!) 150/72  11/25/22 128/82     Allergies  Allergen Reactions   Ozempic (0.25 Or 0.5 Mg-Dose) [Semaglutide(0.25 Or 0.5mg -Dos)] Nausea Only   Trulicity  [Dulaglutide] Nausea And Vomiting    Medications Reviewed Today     Reviewed by [name], MD (Physician) on 01/29/23 at 1007  Med List Status: <None>   Medication Order Taking? Sig Documenting Provider Last Dose Status Informant  aspirin EC 81 MG tablet [id number] Yes Take 81 mg by mouth daily. [provider] Taking Active Spouse/Significant Other  atorvastatin (LIPITOR) 80 MG tablet [id number] Yes Take 1 tablet (80 mg total) by mouth at bedtime. [name], MD Taking Active   Blood Glucose Monitoring Suppl (ONE TOUCH ULTRA 2) w/Device KIT [id number] Yes by Does not apply route. [provider] Taking Active Spouse/Significant Other  carvedilol (COREG) 12.5 MG tablet [id number] Yes Take 1 tablet (12.5 mg total) by mouth 2 (two) times daily with a meal. [name], MD Taking Active   Cholecalciferol (VITAMIN D3) 50 MCG (2000 UT) CAPS [id number] Yes Take 2,000 Units by mouth daily. [provider] Taking Active   Continuous Blood Gluc Receiver (FREESTYLE LIBRE 3 READER) DEVI [id number] Yes 1 Device by Does not apply route every 14 (fourteen) days. Shamleffer, Konrad Dolores, MD Taking Active   Continuous Blood Gluc Sensor (FREESTYLE LIBRE 3 SENSOR) [place] [id number] Yes 1 Device by Does not apply route every 14 (fourteen) days. Shamleffer, Konrad Dolores, MD Taking Active   dapagliflozin propanediol (FARXIGA) 10 MG TABS tablet [id number] Yes Take 1 tablet (10 mg total) by mouth daily. Shamleffer, Konrad Dolores, MD Taking Active   FEROSUL 325 [phone]) MG tablet [id number] Yes TAKE 1 TABLET BY MOUTH DAILY Cirigliano, Vito V, DO Taking Active Spouse/Significant Other  FLUoxetine (PROZAC) 40 MG capsule [id number] Yes Take 1 capsule (40 mg total) by mouth daily. [name], MD Taking Active   gabapentin (NEURONTIN) 300 MG capsule [id number] Yes Take 1 capsule (300 mg total) by mouth 2 (two) times daily.  Patient taking differently: Take 300 mg by mouth daily.   [name], MD  Taking Active   glucose blood [facility] ULTRA) test strip [id number] Yes Check blood sugar 2-3 times daily.  Dx Code: E11.9 [name], MD Taking Active Spouse/Significant Other  insulin glargine (LANTUS SOLOSTAR) 100 UNIT/ML Solostar Pen [id number] Yes Inject 36 Units into the skin daily.  Patient taking differently: Inject 30 Units into the skin daily.   Shamleffer, Konrad Dolores, MD Taking Active   Insulin Pen Needle 32G X 4 MM MISC [id number] Yes 1 Device by Does not apply route daily in the afternoon. Shamleffer, Konrad Dolores, MD Taking Active   magnesium oxide (MAG-OX) 400 MG tablet [id number] Yes Take 1 tablet (400 mg total) by mouth 2 (two) times daily. [name], MD Taking Active   metFORMIN (GLUCOPHAGE) 1000 MG tablet [id number] Yes Take 1 tablet (1,000 mg total) by mouth 2 (two) times daily with a meal. Shamleffer, Konrad Dolores, MD Taking Active   Multiple Vitamins-Minerals (MULTIVITAMIN WITH MINERALS) tablet [id number] Yes Take 1 tablet by mouth daily. [provider] Taking Active Spouse/Significant Other  nitroGLYCERIN (NITROSTAT) 0.4 MG SL tablet [id number] No Place 1 tablet (0.4 mg  total) under the tongue every 5 (five) minutes as needed for chest pain.  Patient not taking: Reported on 01/29/2023   Alver Sorrow, NP Not Taking Active Spouse/Significant Other           Med Note (CANTER, Paula Libra   Fri Jan 29, 2023  9:49 AM) PRN  pantoprazole (PROTONIX) 40 MG tablet [id number] Yes Take 1 tablet (40 mg total) by mouth daily before breakfast. [name], MD Taking Active   Potassium 99 MG TABS [id number] Yes Take 99 mg by mouth daily. [provider] Taking Active Spouse/Significant Other  primidone (MYSOLINE) 50 MG tablet [id number] Yes TAKE 4 TABLETS BY MOUTH EVERY MORNING and TAKE THREE TABLETS EVERYDAY AT BEDTIME [name], Octaviano Batty, DO Taking Active   sacubitril-valsartan (ENTRESTO) 24-26 MG [id number] Yes Take 1 tablet by mouth 2 (two) times daily. [name], MD  Taking Active   Semaglutide,0.25 or 0.5MG /DOS, 2 MG/3ML Namon Cirri [id number] No Inject 0.5 mg into the skin once a week.  Patient not taking: Reported on 01/29/2023   Shamleffer, Konrad Dolores, MD Not Taking Active   spironolactone (ALDACTONE) 25 MG tablet [id number] Yes Take 1 tablet (25 mg total) by mouth daily.  Patient taking differently: Take 25 mg by mouth daily. Patient is only taking half due to pharmacy error   Ellsworth Lennox, PA-C Taking Active   vitamin B-12 (CYANOCOBALAMIN) 1000 MCG tablet [id number] Yes Take 1,000 mcg by mouth daily. 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   Abdominal pain 11/17/2018   Cardiac arrhythmia, unspecified 11/14/2017   Hypomagnesemia 11/14/2017   Anemia 11/14/2017   OSA on CPAP 09/21/2017   Obesity 01/23/2016   PCP NOTES >>> 06/08/2015   Anxiety and depression 05/31/2013   Intermediate coronary syndrome (HCC) 12/22/2012   Unstable angina (HCC) 12/16/2012   Fatigue 04/26/2012   Annual physical exam 10/28/2011   Rash 10/28/2011   Coronary Artery Disease 08/28/2011   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   Palpitations 08/28/2011   NSTEMI (non-ST elevated myocardial infarction) (HCC) 08/07/2011   DJD (degenerative joint disease) 05/28/2011   Poorly controlled type 2 diabetes mellitus with circulatory disorder (HCC)    Hyperlipidemia    HTN (hypertension)      Medication Assistance:    Marcelline Deist and Entresto obtained through [employer]  medication assistance program.  Enrollment ends 01/2023 Reapplied for [employer] today with patient - approved 02/13/2023 thru 02/14/2024    Assessment / Plan: Type 2 DM: A1c improving - goal < 7.5% Continue Lantus to 36 units daily, Farxiga 10mg  daily and metformin 1000mg  twice a day.  Continue to use Continuous Glucose Monitor to check blood glucose and monitor blood glucose trends.   HFmrEF / HTN:  HF controlled; blood pressure in office 01/29/2023 was at goal Continue spironolactone, carvedilol, Farxiga and Entresto  Continue checking blood pressure 2 or 3 times per week   Medication management:  Continue to use adhernece packaging  Reviewed and updated medication list Reviewed refill history and adherence  Follow Up:  Telephone follow up appointment with care management team member scheduled for:  December 2024.   Henrene Pastor, PharmD Clinical Pharmacist [facility] Primary Care  - [facility] [phone]

## 2023-02-05 ENCOUNTER — Other Ambulatory Visit: Payer: Self-pay | Admitting: Internal Medicine

## 2023-02-05 ENCOUNTER — Other Ambulatory Visit: Payer: Self-pay | Admitting: Neurology

## 2023-02-05 DIAGNOSIS — G250 Essential tremor: Secondary | ICD-10-CM

## 2023-02-09 ENCOUNTER — Other Ambulatory Visit: Payer: Self-pay | Admitting: Internal Medicine

## 2023-02-21 ENCOUNTER — Encounter: Payer: Self-pay | Admitting: Internal Medicine

## 2023-03-05 ENCOUNTER — Ambulatory Visit (HOSPITAL_COMMUNITY)
Admission: RE | Admit: 2023-03-05 | Discharge: 2023-03-05 | Disposition: A | Discharge status: Home / Self Care | Payer: PPO | Source: Ambulatory Visit | Attending: Internal Medicine | Admitting: Internal Medicine

## 2023-03-05 DIAGNOSIS — H539 Unspecified visual disturbance: Secondary | ICD-10-CM | POA: Insufficient documentation

## 2023-03-05 DIAGNOSIS — I6523 Occlusion and stenosis of bilateral carotid arteries: Secondary | ICD-10-CM | POA: Diagnosis not present

## 2023-03-05 DIAGNOSIS — I2510 Atherosclerotic heart disease of native coronary artery without angina pectoris: Secondary | ICD-10-CM | POA: Diagnosis not present

## 2023-03-05 DIAGNOSIS — R519 Headache, unspecified: Secondary | ICD-10-CM | POA: Diagnosis not present

## 2023-03-08 ENCOUNTER — Encounter: Payer: Self-pay | Admitting: Internal Medicine

## 2023-03-08 DIAGNOSIS — R42 Dizziness and giddiness: Secondary | ICD-10-CM

## 2023-03-08 DIAGNOSIS — I779 Disorder of arteries and arterioles, unspecified: Secondary | ICD-10-CM

## 2023-03-08 DIAGNOSIS — R519 Headache, unspecified: Secondary | ICD-10-CM

## 2023-03-08 NOTE — Addendum Note (Signed)
Addended byConrad [place] D on: 03/08/2023 04:01 PM   Modules accepted: Orders

## 2023-03-08 NOTE — Telephone Encounter (Signed)
 Order placed

## 2023-03-08 NOTE — Telephone Encounter (Signed)
Arrange for brain MRI without, Dx dizziness, headache, mild to moderate carotid disease.

## 2023-03-11 ENCOUNTER — Other Ambulatory Visit: Payer: Self-pay | Admitting: Internal Medicine

## 2023-04-05 ENCOUNTER — Ambulatory Visit (HOSPITAL_COMMUNITY)
Admission: RE | Admit: 2023-04-05 | Discharge: 2023-04-05 | Disposition: A | Discharge status: Home / Self Care | Payer: PPO | Source: Ambulatory Visit | Attending: Internal Medicine | Admitting: Internal Medicine

## 2023-04-05 DIAGNOSIS — I779 Disorder of arteries and arterioles, unspecified: Secondary | ICD-10-CM | POA: Diagnosis not present

## 2023-04-05 DIAGNOSIS — R42 Dizziness and giddiness: Secondary | ICD-10-CM | POA: Diagnosis not present

## 2023-04-05 DIAGNOSIS — R519 Headache, unspecified: Secondary | ICD-10-CM | POA: Diagnosis not present

## 2023-04-19 ENCOUNTER — Telehealth: Payer: Self-pay | Admitting: Neurology

## 2023-04-19 NOTE — Telephone Encounter (Signed)
Do you still want me to mention MRI results to patient or just ask patient to come in early for orthostatics?

## 2023-04-19 NOTE — Progress Notes (Unsigned)
Assessment/Plan:    1.  Essential Tremor  Continue primidone, 50 mg, 4 tablets in the morning, 3 tablets in the evening   2.  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  It was fairly severe at that time, but clinically his significantly worsened, especially his gait.  He has become much more ataxic.  I have not found any other reasons for ataxia.  He has appeared to develop some degree of foot drop when he walks.  We are going to repeat the EMG  -SCA panel through [facility] was negative.  -Discussed again today that I really think he needs an ambulatory assistive device.  We have discussed this in the past several visits.  We discussed trekking poles.  We have discussed walker.    3.  B12 deficiency  -On oral supplementation  4. Dizziness  -mra neck/brain  -eeg  -he is orthostatic and c/o sx's when we did orthostatic vitals  -If above is negative, he is going to talk to his cardiologist about the symptoms.  He follows with Dr. [name].  -Wife asks about Menieres and I am not sure he has that but we certainly could consider ent evaluation once other evaluations are completed  5.  Tinnitus  -likely due to [facility] Subjective:   Randy Castro. was seen today in follow up.   We did do an ataxia panel through [facility] last visit and that was unremarkable.  He is still on his primidone for tremor, 4 tablets in the morning and 3 in the evening.  He has some trouble with tremor on the right with eating/writing.   Patient reported he wanted to follow-up on MRI that was ordered (I did not order an MRI of the patient).  I reviewed primary care notes, but he has not seen his primary care physician since June.  MRI was not ordered then.  It appears patient sent primary care physician a MyChart message in July requesting an MRI because of dizziness and gait instability and intermittent  headache.  I personally reviewed his MRI brain.  It was unremarkable.  There was a rare number of T2 hyperintensities.  He has had a carotid ultrasound that was negative.  The dizziness isn't a near syncope usually.  About 50% of the time, the dizziness is associated with a change of position but the other 50% of the time, its not.  He will get "black spots" and headaches.  If he extends the neck and looks up at [employer], he will have black spots and headaches.  He has also has tinnitus.  Current prescribed movement disorder medications: Primidone, 50 mg, 4 in the morning, 3 tablets in the evening   ALLERGIES:   Allergies  Allergen Reactions   Ozempic (0.25 Or 0.5 Mg-Dose) [Semaglutide(0.25 Or 0.5mg -Dos)] Nausea Only   Trulicity [Dulaglutide] Nausea And Vomiting    CURRENT MEDICATIONS:  Outpatient Encounter Medications as of 04/20/2023  Medication Sig   aspirin EC 81 MG tablet Take 81 mg by mouth daily.   atorvastatin (LIPITOR) 80 MG tablet Take 1 tablet (80 mg total) by mouth at bedtime.   Blood Glucose Monitoring Suppl (ONE TOUCH ULTRA 2) w/Device KIT by Does not apply route.   carvedilol (COREG) 12.5 MG tablet Take 1 tablet (12.5 mg total) by mouth 2 (two) times daily with a meal.   Cholecalciferol (VITAMIN D3) 50 MCG (2000 UT) CAPS  Take 2,000 Units by mouth daily.   Continuous Blood Gluc Receiver (FREESTYLE LIBRE 3 READER) DEVI 1 Device by Does not apply route every 14 (fourteen) days.   Continuous Blood Gluc Sensor (FREESTYLE LIBRE 3 SENSOR) MISC 1 Device by Does not apply route every 14 (fourteen) days.   dapagliflozin propanediol (FARXIGA) 10 MG TABS tablet Take 1 tablet (10 mg total) by mouth daily.   FEROSUL 325 (65 Fe) MG tablet TAKE 1 TABLET BY MOUTH DAILY   FLUoxetine (PROZAC) 40 MG capsule Take 1 capsule (40 mg total) by mouth daily.   gabapentin (NEURONTIN) 300 MG capsule Take 1 capsule (300 mg total) by mouth 2 (two) times daily. (Patient taking differently: Take 300 mg by mouth  daily.)   glucose blood (ONETOUCH ULTRA) test strip Check blood sugar 2-3 times daily.  Dx Code: E11.9   insulin glargine (LANTUS SOLOSTAR) 100 UNIT/ML Solostar Pen Inject 36 Units into the skin daily.   Insulin Pen Needle 32G X 4 MM MISC 1 Device by Does not apply route daily in the afternoon.   magnesium oxide (MAG-OX) 400 MG tablet Take 1 tablet (400 mg total) by mouth 2 (two) times daily.   metFORMIN (GLUCOPHAGE) 1000 MG tablet Take 1 tablet (1,000 mg total) by mouth 2 (two) times daily with a meal.   Multiple Vitamins-Minerals (MULTIVITAMIN WITH MINERALS) tablet Take 1 tablet by mouth daily.   nitroGLYCERIN (NITROSTAT) 0.4 MG SL tablet Place 1 tablet (0.4 mg total) under the tongue every 5 (five) minutes as needed for chest pain.   pantoprazole (PROTONIX) 40 MG tablet Take 1 tablet (40 mg total) by mouth 2 (two) times daily before a meal.   Potassium 99 MG TABS Take 99 mg by mouth daily.   primidone (MYSOLINE) 50 MG tablet TAKE 4 TABLETS BY MOUTH EVERY MORNING and TAKE THREE TABLETS BY MOUTH EVERYDAY AT BEDTIME   sacubitril-valsartan (ENTRESTO) 24-26 MG Take 1 tablet by mouth 2 (two) times daily.   spironolactone (ALDACTONE) 25 MG tablet Take 1 tablet (25 mg total) by mouth daily. (Patient taking differently: Take 25 mg by mouth daily. Patient is only taking half due to pharmacy error)   vitamin B-12 (CYANOCOBALAMIN) 1000 MCG tablet Take 1,000 mcg by mouth daily.   No facility-administered encounter medications on file as of 04/20/2023.     Objective:    PHYSICAL EXAMINATION:    VITALS:   Vitals:   04/20/23 1325 04/20/23 1326 04/20/23 1327  SpO2: 93% 93% 93%  Weight: 224 lb 9.6 oz (101.9 kg)    Height: 6\' 2"  (1.88 m)      Orthostatic Vitals for the past 48 hrs (Last 6 readings):  Patient Position Orthostatic BP Orthostatic Pulse BP Location  04/20/23 1325 Supine 142/70 63 Left Arm  04/20/23 1326 Sitting 136/70 66 Left Arm  04/20/23 1327 Standing 120/60 67 Left Arm       GEN:  The patient appears stated age and is in NAD. HEENT:  Normocephalic, atraumatic.  The mucous membranes are moist. The superficial temporal arteries are without ropiness or tenderness. CV:  rrr Lungs:  CTAB  Neurological examination:  Orientation: The patient is alert and oriented x3. Cranial nerves: There is good facial symmetry. The speech is fluent and clear. Soft palate rises symmetrically and there is no tongue deviation. Hearing is intact to conversational tone. Sensation: Sensation is intact to light touch throughout.  There is absence of vibratory sensation in the big toe and decreased in the bilateral ankle and knee.  Motor: Strength is 5/5 in the bilateral UE/LE  Movement examination: Tone: There is normal tone in the UE/LE Abnormal movements: no rest tremor.  He has postural tremor on the right, mild to mod.  He actually has little intention tremor.  He has trouble with Archimedes spirals on the right.     Coordination:  There is no decremation with RAM's,  Gait and Station: The patient pushes off the chair to arise.  He stands in front of the chair to get his balance before he walks.  The patient is significantly wide based and ataxic. I have reviewed and interpreted the following labs independently   Lab Results  Component Value Date   TSH 1.61 05/27/2022     Chemistry      Component Value Date/Time   NA 141 01/29/2023 1029   NA 141 01/20/2022 1056   K 4.3 01/29/2023 1029   CL 104 01/29/2023 1029   CO2 29 01/29/2023 1029   BUN 16 01/29/2023 1029   BUN 9 01/20/2022 1056   CREATININE 0.85 01/29/2023 1029   CREATININE 1.20 06/15/2016 0849      Component Value Date/Time   CALCIUM 8.9 01/29/2023 1029   ALKPHOS 53 12/07/2021 0040   AST 25 01/29/2023 1029   ALT 32 01/29/2023 1029   BILITOT 0.6 12/07/2021 0040       Lab Results  Component Value Date   HGBA1C 7.2 (H) 09/30/2022    Lab Results  Component Value Date   VITAMINB12 559 10/21/2021       Total time spent on today's visit was 35 minutes, including both face-to-face time and nonface-to-face time.  Time included that spent on review of records (prior notes available to me/labs/imaging if pertinent), discussing treatment and goals, answering patient's questions and coordinating care.  Cc:  [name], MD

## 2023-04-19 NOTE — Telephone Encounter (Signed)
Pts spouse returned the call to the office needing to have a return call back.

## 2023-04-20 ENCOUNTER — Ambulatory Visit: Payer: PPO | Admitting: Neurology

## 2023-04-20 ENCOUNTER — Encounter: Payer: Self-pay | Admitting: Neurology

## 2023-04-20 VITALS — Ht 74.0 in | Wt 224.6 lb

## 2023-04-20 DIAGNOSIS — H814 Vertigo of central origin: Secondary | ICD-10-CM

## 2023-04-20 DIAGNOSIS — R42 Dizziness and giddiness: Secondary | ICD-10-CM | POA: Diagnosis not present

## 2023-04-20 DIAGNOSIS — R519 Headache, unspecified: Secondary | ICD-10-CM

## 2023-04-20 DIAGNOSIS — G609 Hereditary and idiopathic neuropathy, unspecified: Secondary | ICD-10-CM | POA: Diagnosis not present

## 2023-04-20 NOTE — Patient Instructions (Signed)
A referral to [facility] has been placed for your MRA someone will contact you directly to schedule your appt. They are located at [address] [facility]. Please contact them directly by calling [phone] with any questions regarding your referral.

## 2023-04-21 ENCOUNTER — Other Ambulatory Visit: Payer: Self-pay

## 2023-04-21 ENCOUNTER — Other Ambulatory Visit: Payer: Self-pay | Admitting: *Deleted

## 2023-04-21 ENCOUNTER — Other Ambulatory Visit: Payer: PPO

## 2023-04-21 ENCOUNTER — Encounter: Payer: Self-pay | Admitting: Neurology

## 2023-04-21 DIAGNOSIS — G250 Essential tremor: Secondary | ICD-10-CM

## 2023-04-21 DIAGNOSIS — I2510 Atherosclerotic heart disease of native coronary artery without angina pectoris: Secondary | ICD-10-CM

## 2023-04-21 DIAGNOSIS — I5022 Chronic systolic (congestive) heart failure: Secondary | ICD-10-CM

## 2023-04-21 DIAGNOSIS — R42 Dizziness and giddiness: Secondary | ICD-10-CM

## 2023-04-21 DIAGNOSIS — I10 Essential (primary) hypertension: Secondary | ICD-10-CM

## 2023-04-21 MED ORDER — MAGNESIUM OXIDE 400 MG PO TABS: 400.0000 mg | ORAL_TABLET | Freq: Two times a day (BID) | ORAL | 1 refills | Status: DC

## 2023-04-21 MED ORDER — ONETOUCH ULTRA VI STRP: ORAL_STRIP | 12 refills | Status: AC

## 2023-04-21 MED ORDER — CARVEDILOL 12.5 MG PO TABS: 12.5000 mg | ORAL_TABLET | Freq: Two times a day (BID) | ORAL | 3 refills | Status: DC

## 2023-04-21 MED ORDER — PRIMIDONE 50 MG PO TABS
ORAL_TABLET | ORAL | 0 refills | Status: DC
Start: 2023-04-21 — End: 2023-07-29

## 2023-04-21 MED ORDER — ATORVASTATIN CALCIUM 80 MG PO TABS: 80.0000 mg | ORAL_TABLET | Freq: Every day | ORAL | 3 refills | Status: DC

## 2023-04-21 MED ORDER — LANTUS SOLOSTAR 100 UNIT/ML ~~LOC~~ SOPN: 36.0000 [IU] | PEN_INJECTOR | Freq: Every day | SUBCUTANEOUS | 3 refills | Status: DC

## 2023-04-21 MED ORDER — ENTRESTO 24-26 MG PO TABS
1.0000 | ORAL_TABLET | Freq: Two times a day (BID) | ORAL | 3 refills | Status: DC
Start: 2023-04-21 — End: 2024-05-10

## 2023-04-21 MED ORDER — DAPAGLIFLOZIN PROPANEDIOL 10 MG PO TABS: 10.0000 mg | ORAL_TABLET | Freq: Every day | ORAL | 3 refills | Status: DC

## 2023-04-21 MED ORDER — PANTOPRAZOLE SODIUM 40 MG PO TBEC: 40.0000 mg | DELAYED_RELEASE_TABLET | Freq: Two times a day (BID) | ORAL | 1 refills | Status: DC

## 2023-04-21 MED ORDER — NITROGLYCERIN 0.4 MG SL SUBL
0.4000 mg | SUBLINGUAL_TABLET | SUBLINGUAL | 3 refills | Status: AC | PRN
Start: 2023-04-21 — End: ?

## 2023-04-21 MED ORDER — METFORMIN HCL 1000 MG PO TABS: 1000.0000 mg | ORAL_TABLET | Freq: Two times a day (BID) | ORAL | 3 refills | Status: DC

## 2023-04-21 MED ORDER — GABAPENTIN 300 MG PO CAPS: 300.0000 mg | ORAL_CAPSULE | Freq: Two times a day (BID) | ORAL | 1 refills | Status: DC

## 2023-04-21 MED ORDER — FREESTYLE LIBRE 3 SENSOR MISC: 1.0000 | 3 refills | Status: DC

## 2023-04-21 MED ORDER — SPIRONOLACTONE 25 MG PO TABS: 12.5000 mg | ORAL_TABLET | Freq: Every day | ORAL | 3 refills | Status: DC

## 2023-04-21 MED ORDER — FLUOXETINE HCL 40 MG PO CAPS: 40.0000 mg | ORAL_CAPSULE | Freq: Every day | ORAL | 1 refills | Status: DC

## 2023-04-21 NOTE — Progress Notes (Signed)
EEG complete - results pending 

## 2023-04-21 NOTE — Procedures (Signed)
TECHNICAL SUMMARY:  A multichannel referential and bipolar montage EEG using the standard international 10-20 system was performed on the patient described as awake, drowsy and asleep.  The dominant background activity consists of 8 to 9 hertz activity seen most prominantly over the posterior head region.  The backgound activity is reactive to eye opening and closing procedures.  Low voltage fast (beta) activity is distributed symmetrically and maximally over the anterior head regions.  ACTIVATION:  Stepwise photic stimulation at 4-20 flashes per second was performed and did not elicit any abnormal waveforms.  There was a symmetric driving response  EPILEPTIFORM ACTIVITY:  There were no spikes, sharp waves or paroxysmal activity.  SLEEP: Stage I and stage II sleep architecture are identified.  CARDIAC:  The EKG lead revealed a sinus arrhythmia  IMPRESSION:  This is a normal EEG for the patients stated age.  There were no focal, hemispheric or lateralizing features.  No epileptiform activity was recorded.  A normal EEG does not exclude the diagnosis of a seizure disorder and if seizure remains high on the list of differential diagnosis, an ambulatory EEG may be of value.  Clinical correlation is required.

## 2023-04-21 NOTE — Telephone Encounter (Signed)
Contacted to verify dose of spironolactone due to refill request received from [facility]. Per wife Bonita Quin, patient has been taking spironolactone 12.5 mg daily due to pharmacy dispensing it with these instructions.

## 2023-04-22 DIAGNOSIS — E119 Type 2 diabetes mellitus without complications: Secondary | ICD-10-CM | POA: Diagnosis not present

## 2023-04-22 DIAGNOSIS — H25013 Cortical age-related cataract, bilateral: Secondary | ICD-10-CM | POA: Diagnosis not present

## 2023-04-22 DIAGNOSIS — H524 Presbyopia: Secondary | ICD-10-CM | POA: Diagnosis not present

## 2023-04-22 DIAGNOSIS — H2513 Age-related nuclear cataract, bilateral: Secondary | ICD-10-CM | POA: Diagnosis not present

## 2023-05-07 ENCOUNTER — Ambulatory Visit: Payer: PPO | Admitting: Neurology

## 2023-05-07 DIAGNOSIS — G609 Hereditary and idiopathic neuropathy, unspecified: Secondary | ICD-10-CM | POA: Diagnosis not present

## 2023-05-07 NOTE — Procedures (Signed)
[facility]  [address] [place], [address]  [place], [place] [address] Tel: [phone] Fax: [phone] Test Date:  05/07/2023  Patient: Randy Castro, Randy Castro. DOB: [birth year] Physician: Nita Sickle, DO  Sex: Male Height: 6\' 2"  Ref Phys: Kerin Salen, DO  ID#: [id number]   Technician:    History: This is a 72 year old man with history of peripheral neuropathy referred for evaluation of worsening gait instability.  NCV & EMG Findings: Extensive electrodiagnostic testing of the right lower extremity and additional studies of the left shows:  Bilateral sural and superficial peroneal sensory responses are absent. Bilateral peroneal (EDB) and tibial motor responses are absent.  Bilateral peroneal motor responses at the tibialis anterior are within normal limits. Bilateral tibial H reflex studies were absent. Chronic motor axonal loss changes are seen affecting bilateral medial gastrocnemius, tibialis anterior, flexor digitorum longus, rectus femoris, and right abductor longus muscles.  There is no evidence of accompanying active denervation affecting any of the tested muscles.  Impression: Chronic and symmetric sensorimotor axonal polyneuropathy affecting the lower extremities, which is severe in degree electrically.  When compared to prior study on 12/04/2020, there has been mild progression. There may also be an overlapping L3-4 radiculopathy affecting bilateral lower extremities, which is new.    ___________________________ Nita Sickle, DO    Nerve Conduction Studies   Stim Site NR Peak (ms) Norm Peak (ms) O-P Amp (V) Norm O-P Amp  Left Sup Peroneal Anti Sensory (Ant Lat Mall)  32 C  12 cm *NR  <4.6  >3  Right Sup Peroneal Anti Sensory (Ant Lat Mall)  32 C  12 cm *NR  <4.6  >3  Left Sural Anti Sensory (Lat Mall)  32 C  Calf *NR  <4.6  >3  Right Sural Anti Sensory (Lat Mall)  32 C  Calf *NR  <4.6  >3     Stim Site NR Onset (ms) Norm Onset (ms) O-P Amp (mV) Norm  O-P Amp Site1 Site2 Delta-0 (ms) Dist (cm) Vel (m/s) Norm Vel (m/s)  Left Peroneal Motor (Ext Dig Brev)  32 C  Ankle *NR  <6.0  >2.5 B Fib Ankle  0.0  >40  B Fib *NR     Poplt B Fib  0.0  >40  Poplt *NR            Right Peroneal Motor (Ext Dig Brev)  32 C  Ankle *NR  <6.0  >2.5 B Fib Ankle  0.0  >40  B Fib *NR     Poplt B Fib  0.0  >40  Poplt *NR            Left Peroneal TA Motor (Tib Ant)  32 C  Fib Head    4.5 <4.5 3.5 >3 Poplit Fib Head 1.5 10.0 67 >40  Poplit    5.6 <5.7 3.5         Right Peroneal TA Motor (Tib Ant)  32 C  Fib Head    3.1 <4.5 3.9 >3 Poplit Fib Head 1.6 10.0 63 >40  Poplit    4.7 <5.7 3.7         Left Tibial Motor (Abd Hall Brev)  32 C  Ankle *NR  <6.0  >4 Knee Ankle  0.0  >40  Knee *NR            Right Tibial Motor (Abd Hall Brev)  32 C  Ankle *NR  <6.0  >4 Knee Ankle  0.0  >40  Knee *NR  Electromyography   Side Muscle Ins.Act Fibs Fasc Recrt Amp Dur Poly Activation Comment  Left Gastroc Nml Nml Nml *2- *1+ *1+ *1+ Nml N/A  Left Flex Dig Long Nml Nml Nml *3- *1+ *1+ *1+ Nml N/A  Left AntTibialis Nml Nml Nml *2- *1+ *1+ *1+ Nml N/A  Left RectFemoris Nml Nml Nml *1- *1+ *1+ *1+ Nml N/A  Left GluteusMed Nml Nml Nml Nml Nml Nml Nml Nml N/A  Right RectFemoris Nml Nml Nml *1- *1+ *1+ *1+ Nml N/A  Right AdductorLong Nml Nml Nml *1- *1+ *1+ *1+ Nml N/A  Right AntTibialis Nml Nml Nml *2- *1+ *1+ *1+ Nml N/A  Right Gastroc Nml Nml Nml *2- *1+ *1+ *1+ Nml N/A  Right Flex Dig Long Nml Nml Nml *3- *1+ *1+ *1+ Nml N/A  Right BicepsFemS Nml Nml Nml Nml Nml Nml Nml Nml N/A  Right GluteusMed Nml Nml Nml Nml Nml Nml Nml Nml N/A      Waveforms:

## 2023-05-10 ENCOUNTER — Other Ambulatory Visit: Payer: Self-pay

## 2023-05-10 DIAGNOSIS — R270 Ataxia, unspecified: Secondary | ICD-10-CM

## 2023-05-10 DIAGNOSIS — G8929 Other chronic pain: Secondary | ICD-10-CM

## 2023-05-15 ENCOUNTER — Ambulatory Visit (HOSPITAL_COMMUNITY)
Admission: RE | Admit: 2023-05-15 | Discharge: 2023-05-15 | Disposition: A | Discharge status: Home / Self Care | Payer: PPO | Source: Ambulatory Visit | Attending: Neurology | Admitting: Neurology

## 2023-05-15 DIAGNOSIS — M545 Low back pain, unspecified: Secondary | ICD-10-CM | POA: Diagnosis not present

## 2023-05-15 DIAGNOSIS — M48061 Spinal stenosis, lumbar region without neurogenic claudication: Secondary | ICD-10-CM | POA: Diagnosis not present

## 2023-05-15 DIAGNOSIS — M5137 Other intervertebral disc degeneration, lumbosacral region: Secondary | ICD-10-CM | POA: Diagnosis not present

## 2023-05-15 DIAGNOSIS — G8929 Other chronic pain: Secondary | ICD-10-CM | POA: Insufficient documentation

## 2023-05-15 DIAGNOSIS — R270 Ataxia, unspecified: Secondary | ICD-10-CM | POA: Insufficient documentation

## 2023-05-15 DIAGNOSIS — M47816 Spondylosis without myelopathy or radiculopathy, lumbar region: Secondary | ICD-10-CM | POA: Diagnosis not present

## 2023-05-15 DIAGNOSIS — M5136 Other intervertebral disc degeneration, lumbar region: Secondary | ICD-10-CM | POA: Diagnosis not present

## 2023-05-18 ENCOUNTER — Ambulatory Visit
Admission: RE | Admit: 2023-05-18 | Discharge: 2023-05-18 | Disposition: A | Discharge status: Home / Self Care | Payer: PPO | Source: Ambulatory Visit | Attending: Neurology | Admitting: Neurology

## 2023-05-18 DIAGNOSIS — H814 Vertigo of central origin: Secondary | ICD-10-CM

## 2023-05-18 DIAGNOSIS — Q282 Arteriovenous malformation of cerebral vessels: Secondary | ICD-10-CM | POA: Diagnosis not present

## 2023-05-20 ENCOUNTER — Other Ambulatory Visit: Payer: PPO

## 2023-05-21 LAB — HM DIABETES EYE EXAM

## 2023-05-28 ENCOUNTER — Ambulatory Visit: Payer: PPO | Admitting: Internal Medicine

## 2023-06-01 ENCOUNTER — Telehealth: Payer: Self-pay

## 2023-06-01 ENCOUNTER — Ambulatory Visit (INDEPENDENT_AMBULATORY_CARE_PROVIDER_SITE_OTHER): Payer: PPO | Admitting: Internal Medicine

## 2023-06-01 ENCOUNTER — Encounter: Payer: Self-pay | Admitting: Internal Medicine

## 2023-06-01 VITALS — BP 128/60 | HR 65 | Temp 97.8°F | Resp 16 | Ht 74.0 in | Wt 225.0 lb

## 2023-06-01 DIAGNOSIS — E1165 Type 2 diabetes mellitus with hyperglycemia: Secondary | ICD-10-CM

## 2023-06-01 DIAGNOSIS — E559 Vitamin D deficiency, unspecified: Secondary | ICD-10-CM | POA: Diagnosis not present

## 2023-06-01 DIAGNOSIS — F32A Depression, unspecified: Secondary | ICD-10-CM | POA: Diagnosis not present

## 2023-06-01 DIAGNOSIS — Z794 Long term (current) use of insulin: Secondary | ICD-10-CM

## 2023-06-01 DIAGNOSIS — E538 Deficiency of other specified B group vitamins: Secondary | ICD-10-CM | POA: Diagnosis not present

## 2023-06-01 DIAGNOSIS — G4733 Obstructive sleep apnea (adult) (pediatric): Secondary | ICD-10-CM

## 2023-06-01 DIAGNOSIS — Z0000 Encounter for general adult medical examination without abnormal findings: Secondary | ICD-10-CM | POA: Diagnosis not present

## 2023-06-01 DIAGNOSIS — R809 Proteinuria, unspecified: Secondary | ICD-10-CM | POA: Diagnosis not present

## 2023-06-01 DIAGNOSIS — F419 Anxiety disorder, unspecified: Secondary | ICD-10-CM | POA: Diagnosis not present

## 2023-06-01 DIAGNOSIS — Z0001 Encounter for general adult medical examination with abnormal findings: Secondary | ICD-10-CM

## 2023-06-01 IMAGING — MR MR HEAD W/O CM
13 of 14 series · 37 of 48 positions shown · non-contrast
Comparison: No prior MRI head, correlation is made with 08/03/2016
head CT

CLINICAL DATA: Dizziness, nonspecific, rule out stroke

EXAM:
MRI HEAD WITHOUT CONTRAST
TECHNIQUE: Multiplanar, multiecho pulse sequences of the brain and surrounding
structures were obtained without intravenous contrast.

[Series 5: DWI · axial · 3.0mm · 0.77mm/px · z∈[-20,+121]mm · 3 of 48 slices shown (1 of 6)]
[im 1/48]
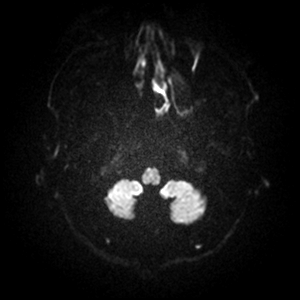
[im 24/48]
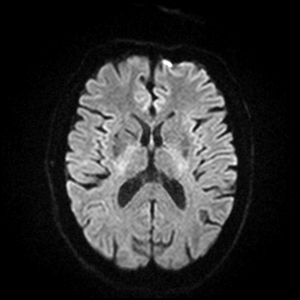
[im 48/48]
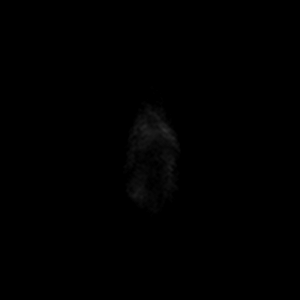

[Series 5: DWI · axial · 3.0mm · 0.77mm/px · z∈[-20,+121]mm · 3 of 48 slices shown (2 of 6)]
[im 1/48]
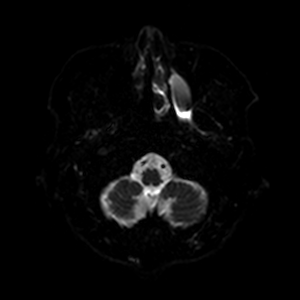
[im 24/48]
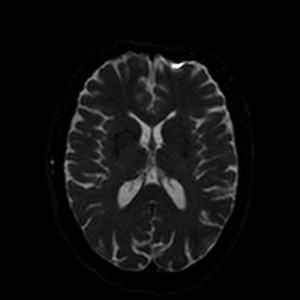
[im 48/48]
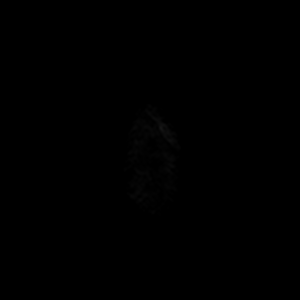

[Series 6: DWI · axial · 3.0mm · 0.77mm/px · z∈[-20,+121]mm · 3 of 48 slices shown (3 of 6)]
[im 1/48]
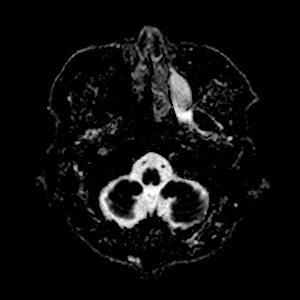
[im 24/48]
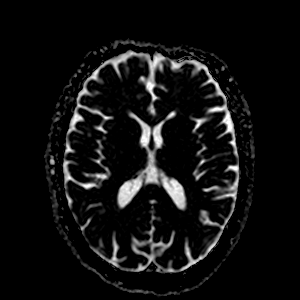
[im 48/48]
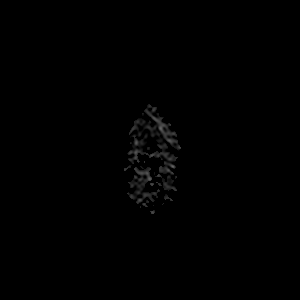

[Series 7: DWI · coronal · 5.0mm · 0.88mm/px · 2 of 29 slices shown (4 of 6)]
[im 1/29]
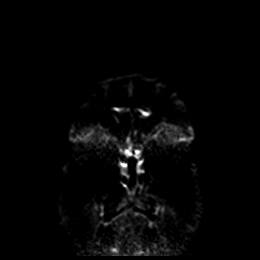
[im 29/29]
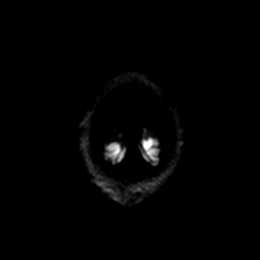

[Series 7: DWI · coronal · 5.0mm · 0.88mm/px · 2 of 29 slices shown (5 of 6)]
[im 1/29]
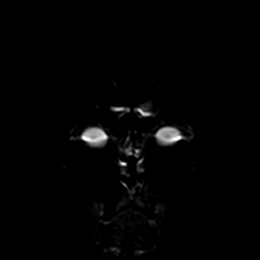
[im 29/29]
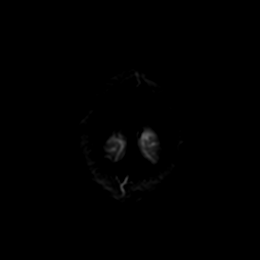

[Series 8: DWI · coronal · 5.0mm · 0.88mm/px · 2 of 29 slices shown (6 of 6)]
[im 1/29]
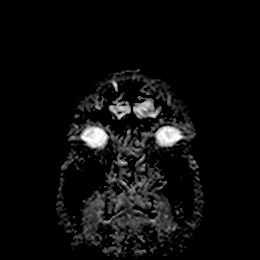
[im 29/29]
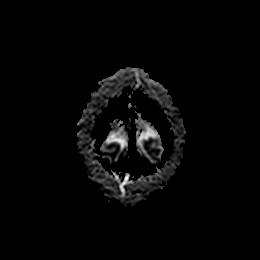

[Series 9: T1 · sagittal · 5.0mm · 0.75mm/px · 2 of 22 slices shown]
[im 1/22]
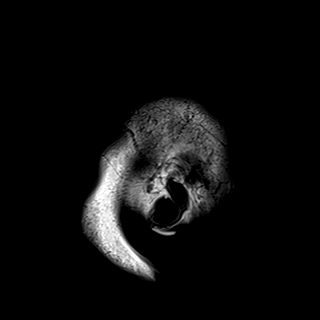
[im 22/22]
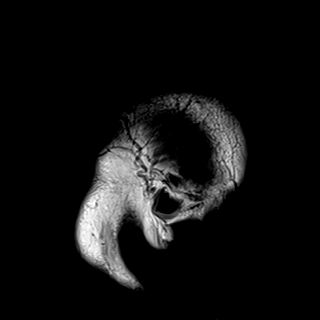

[Series 10: T2 · axial · 5.0mm · 0.72mm/px · z∈[-19,+121]mm · 2 of 21 slices shown (1 of 2)]
[im 1/21]
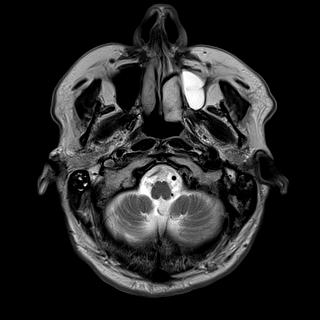
[im 21/21]
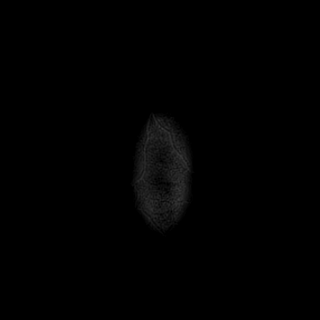

[Series 11: mag_images · axial · 3.0mm · 0.90mm/px · z∈[-26,+127]mm · 4 of 52 slices shown]
[im 1/52]
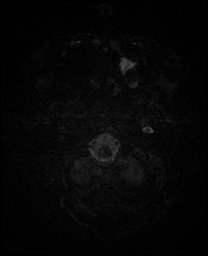
[im 18/52]
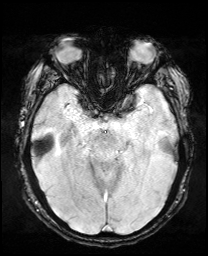
[im 35/52]
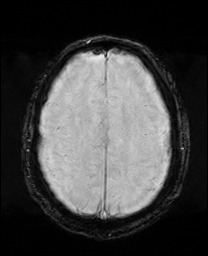
[im 52/52]
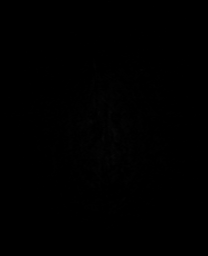

[Series 12: pha_images · axial · 3.0mm · 0.90mm/px · z∈[-26,+127]mm · 4 of 51 slices shown]
[im 1/51]
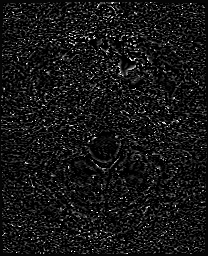
[im 17/51]
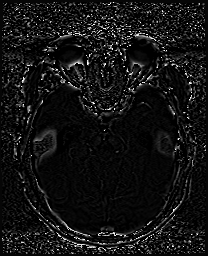
[im 34/51]
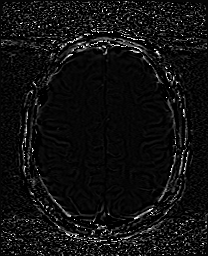
[im 51/51]
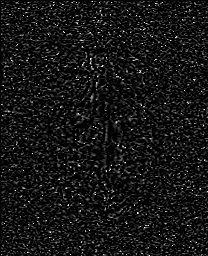

[Series 13: swi_images · axial · 3.0mm · 0.90mm/px · z∈[-26,+127]mm · 4 of 52 slices shown]
[im 1/52]
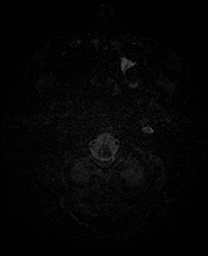
[im 18/52]
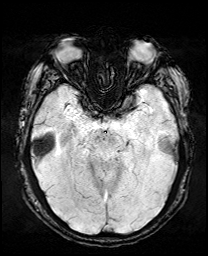
[im 35/52]
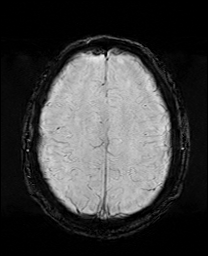
[im 52/52]
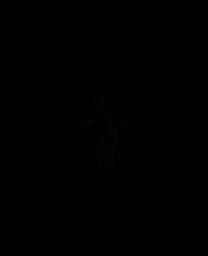

[Series 15: FLAIR · axial · 3.0mm · 0.45mm/px · z∈[-22,+122]mm · 4 of 49 slices shown]
[im 1/49]
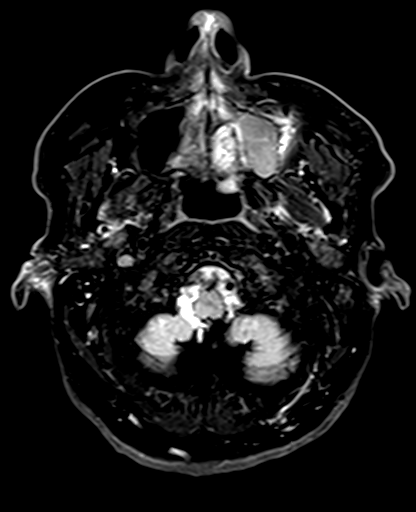
[im 17/49]
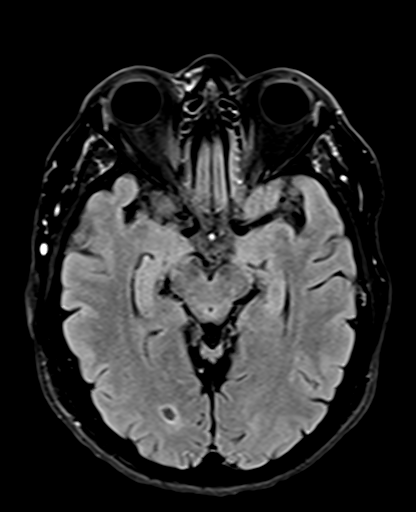
[im 33/49]
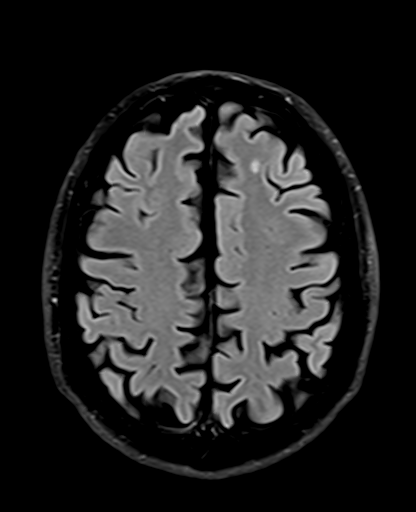
[im 49/49]
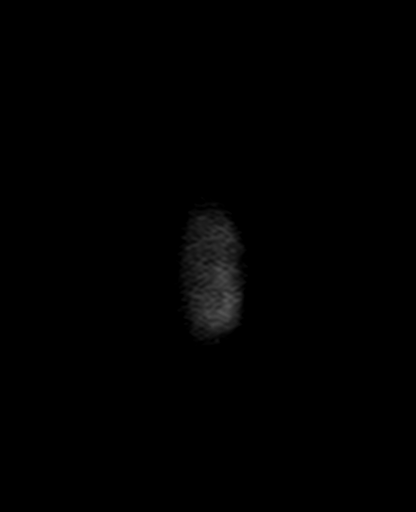

[Series 17: T2 · coronal · 5.0mm · 0.72mm/px · 2 of 30 slices shown (2 of 2)]
[im 1/30]
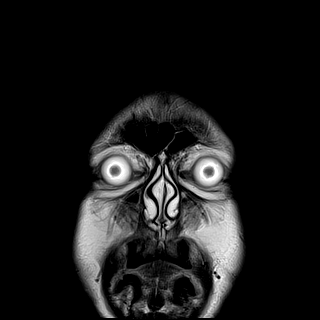
[im 30/30]
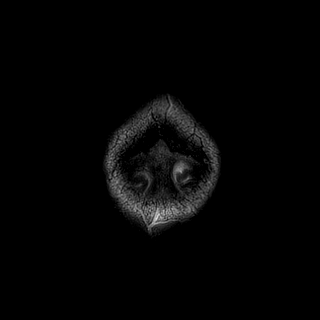

[37 of 48 positions shown; findings below may reference images not displayed]

FINDINGS: Brain: No restricted diffusion to suggest acute or subacute infarct.
No acute hemorrhage, mass, mass effect, or midline shift. No
hydrocephalus or extra-axial collection. Scattered T2 hyperintense
signal in the periventricular white matter, likely the sequela of
mild chronic small vessel ischemic disease.

Vascular: Normal flow voids. Incidental note is made of a right
frontal developmental venous anomaly.

Skull and upper cervical spine: Normal marrow signal

Sinuses/Orbits: Mucous retention cysts in the left maxillary sinus.
Otherwise clear paranasal sinuses. The orbits are unremarkable.

Other: Trace fluid in the right mastoid air cells.
IMPRESSION: No acute intracranial process. No etiology is seen for the patient's
dizziness.

## 2023-06-01 NOTE — Assessment & Plan Note (Signed)
Here for CPX -Td  2014 -PNM 23: 2012, 2020;  prevnar 2016 - zostavax 2014; s/p shingrex   - had a flu shot  - Rec: COVID booster,  Tdap, RSV  --CCS:   Cologuard NEG 01/24/17, C-scope 10/23/19 (done in [place]) , 12/26/19 Dr Barron Alvine, next 5 years per GI letter  -- Prostate ca screening:  no sxs check PSA --Patient education:  ACP information provided --Labs: B12, vitamin D, PSA.

## 2023-06-01 NOTE — Assessment & Plan Note (Signed)
Here for CPX DM: Per endocrinology Tremors, gait instability, dizziness. Neurology visit 04/20/2023. Seen for essential tremor, he c/o  gait stability, suspected due to peripheral neuropathy.  Had dizziness, EEG was normal, had a MRI/MRA head and neck, results pending Proteinuria: Saw nephrology 12/2022, + proteinuria but GFR was normal.  Next visit 1 year B12 and vitamin D deficiencies: On supplements, check labs. H/o OSA: Stopped using a CPAP a while back, most symptoms dissipated after he lost weight from bariatric surgery 2019.  Currently not snoring. H/o skin cancer: Encouraged to see dermatology Anxiety depression: Currently well-controlled on fluoxetine RTC 4 months.

## 2023-06-01 NOTE — Telephone Encounter (Signed)
Patients PCP reached out to ask about MRI results. Radiology is weeks behind and I called patients wife and explained the situation

## 2023-06-01 NOTE — Progress Notes (Signed)
Subjective:    Patient ID: Randy Castro., male    DOB: [birth year], 72 y.o.   MRN: [id number]  DOS:  06/01/2023 Type of visit - description: cpx  Here for CPX. In general feeling well. Still feeling dizzy and has some difficulty with his gait. No LOC, no focal deficits, no falls.  Review of Systems  Other than above, a 14 point review of systems is negative    Past Medical History:  Diagnosis Date  . Anxiety   . Arthritis   . BCC (basal cell carcinoma of skin) 12/2020  . CAD (coronary artery disease)    a. NSTEMI 12/12: EF 40-45%. EXB:MWUXLKG balloon PTCA + Promus DES x 1 to mid LAD;  b. 11/2012: Cath with Cutting balloon PTCA  LAD for ISR to LAD, EF 55%;  c. 12/2012 Cath/PCI: LAD stents patent, 80 ost Diag (jailed)->PTCA, LCX/RCA patent. d. s/p NSTEMI in 11/2021 with DES to D1  . Essential hypertension   . GI bleed   . History of blood transfusion    was getting 4 units  . Hyperlipidemia   . Ischemic cardiomyopathy    a.  echo 08/06/11: dist ant wall, apical and septal and infero-apical HK, mild LVH, EF 40%, mild LAE, PASP 34, asc aorta mildly dilated, mild TR;  b.  Echo (3/13) showed recovery of LV systolic function with EF 55-60%, grade I diastolic dysfunction, mild MR.   . Myocardial infarction (HCC)    twice with NSTEMI 2012  . Palpitations   . Type 2 diabetes mellitus (HCC)     Past Surgical History:  Procedure Laterality Date  . ANTERIOR CERVICAL DECOMP/DISCECTOMY FUSION  in the 90s  . CHOLECYSTECTOMY  03/27/2019  . COLONOSCOPY  2021  . COLONOSCOPY WITH ESOPHAGOGASTRODUODENOSCOPY (EGD)  10/2019  . CORONARY ANGIOPLASTY  12/15/2012; 12/22/2012  . CORONARY ANGIOPLASTY WITH STENT PLACEMENT  07/2011   "1" (12/22/2012)  . CORONARY STENT INTERVENTION N/A 12/08/2021   Procedure: CORONARY STENT INTERVENTION;  Surgeon: Orbie Pyo, MD;  Location: [facility] INVASIVE CV LAB;  Service: Cardiovascular;  Laterality: N/A;  . GASTRIC BYPASS  12/21/2017  . KNEE ARTHROSCOPY Left  12/31/2016  . LEFT HEART CATH AND CORONARY ANGIOGRAPHY N/A 12/08/2021   Procedure: LEFT HEART CATH AND CORONARY ANGIOGRAPHY;  Surgeon: Orbie Pyo, MD;  Location: [facility] INVAS[facility] CV [facility];  Service: Cardiovascular;  Laterality: N/A;  . LEFT HEART CATHETERIZATION WITH CORONARY ANGIOGRAM N/A 08/06/2011   Procedure: LEFT HEART CATHETERIZATION WITH CORONARY ANGIOGRAM;  Surgeon: Kathleene Hazel, MD;  Location: [facility] CATH LAB;  Service: Cardiovascular;  Laterality: N/A;  . LEFT HEART CATHETERIZATION WITH CORONARY ANGIOGRAM N/A 12/15/2012   Procedure: LEFT HEART CATHETERIZATION WITH CORONARY ANGIOGRAM;  Surgeon: Tonny Bollman, MD;  Location: [facility] CATH LAB;  Service: Cardiovascular;  Laterality: N/A;  . LEFT HEART CATHETERIZATION WITH CORONARY ANGIOGRAM N/A 12/22/2012   Procedure: LEFT HEART CATHETERIZATION WITH CORONARY ANGIOGRAM;  Surgeon: Tonny Bollman, MD;  Location: [facility] CATH LAB;  Service: Cardiovascular;  Laterality: N/A;  . PERCUTANEOUS CORONARY STENT INTERVENTION (PCI-S) Right 12/15/2012   Procedure: PERCUTANEOUS CORONARY STENT INTERVENTION (PCI-S);  Surgeon: Tonny Bollman, MD;  Location: [facility] CATH LAB;  Service: Cardiovascular;  Laterality: Right;   Social History   Socioeconomic History  . Marital status: Married    Spouse name: Not on file  . Number of children: 2   . Years of education: Not on file  . Highest education level: Not on file  Occupational History  . Occupation: Environmental consultant  Tobacco Use  . Smoking status: Former    Current packs/day: 0.00    Average packs/day: 1 pack/day for 25.0 years (25.0 ttl pk-yrs)    Types: Cigarettes    Start date: 04/13/1967    Quit date: 04/12/1992    Years since quitting: 31.1    Passive exposure: Never  . Smokeless tobacco: Never  . Tobacco comments:    Quit tobacco > 30 years ago  Vaping Use  . Vaping status: Never Used  Substance and Sexual Activity  . Alcohol use: Not Currently    Comment: 12/22/2012 "rarely maybe every 3-4  months, beer"  . Drug use: No  . Sexual activity: Yes  Other Topics Concern  . Not on file  Social History Narrative   Moved back to [place] from [facility] 2011   Household-- pt and wife   Hobby: Industrial/product designer   Social Determinants of Health   Financial Resource Strain: Medium Risk (02/16/2022)   Overall Financial Resource Strain (CARDIA)   . Difficulty of Paying Living Expenses: Somewhat hard  Food Insecurity: No Food Insecurity (06/02/2021)   Hunger Vital Sign   . Worried About Programme researcher, broadcasting/film/video in the Last Year: Never true   . Ran Out of Food in the Last Year: Never true  Transportation Needs: No Transportation Needs (05/26/2021)   PRAPARE - Transportation   . Lack of Transportation (Medical): No   . Lack of Transportation (Non-Medical): No  Physical Activity: Insufficiently Active (09/01/2022)   Exercise Vital Sign   . Days of Exercise per Week: 2 days   . Minutes of Exercise per Session: 40 min  Stress: No Stress Concern Present (06/02/2021)   [employer] of Occupational Health - Occupational Stress Questionnaire   . Feeling of Stress : Not at all  Social Connections: Moderately Integrated (06/02/2021)   Social Connection and Isolation Panel [NHANES]   . Frequency of Communication with Friends and Family: More than three times a week   . Frequency of Social Gatherings with Friends and Family: More than three times a week   . Attends Religious Services: More than 4 times per year   . Active Member of Clubs or Organizations: No   . Attends Banker Meetings: Never   . Marital Status: Married  Catering manager Violence: Not At Risk (06/02/2021)   Humiliation, Afraid, Rape, and Kick questionnaire   . Fear of Current or Ex-Partner: No   . Emotionally Abused: No   . Physically Abused: No   . Sexually Abused: No    Current Outpatient Medications  Medication Instructions  . aspirin EC 81 mg, Oral, Daily  . atorvastatin (LIPITOR) 80 mg, Oral, Daily at bedtime  .  Blood Glucose Monitoring Suppl (ONE TOUCH ULTRA 2) w/Device KIT Does not apply  . carvedilol (COREG) 12.5 mg, Oral, 2 times daily with meals  . Continuous Blood Gluc Receiver (FREESTYLE LIBRE 3 READER) DEVI 1 Device, Does not apply, Every 14 days  . Continuous Glucose Sensor (FREESTYLE LIBRE 3 SENSOR) MISC 1 Device, Does not apply, Every 14 days  . cyanocobalamin (VITAMIN B12) 1,000 mcg, Oral, Daily  . dapagliflozin propanediol (FARXIGA) 10 mg, Oral, Daily  . FLUoxetine (PROZAC) 40 mg, Oral, Daily  . gabapentin (NEURONTIN) 300 mg, Oral, 2 times daily  . glucose blood (ONETOUCH ULTRA) test strip Check blood sugar 2-3 times daily.  Dx Code: E11.9  . Insulin Pen Needle 32G X 4 MM MISC 1 Device, Does not apply, Daily  . Lantus SoloStar  36 Units, Subcutaneous, Daily  . magnesium oxide (MAG-OX) 400 mg, Oral, 2 times daily  . metFORMIN (GLUCOPHAGE) 1,000 mg, Oral, 2 times daily with meals  . Multiple Vitamins-Minerals (MULTIVITAMIN WITH MINERALS) tablet 1 tablet, Oral, Daily  . nitroGLYCERIN (NITROSTAT) 0.4 mg, Sublingual, Every 5 min x3 PRN  . pantoprazole (PROTONIX) 40 mg, Oral, 2 times daily before meals  . Potassium 99 mg, Oral, Daily  . primidone (MYSOLINE) 50 MG tablet TAKE 4 TABLETS BY MOUTH EVERY MORNING and TAKE THREE TABLETS BY MOUTH EVERYDAY AT BEDTIME  . sacubitril-valsartan (ENTRESTO) 24-26 MG 1 tablet, Oral, 2 times daily  . spironolactone (ALDACTONE) 12.5 mg, Oral, Daily, Patient is only taking half due to pharmacy error  . vitamin D3 2,000 Units, Oral, Daily       Objective:   Physical Exam BP 128/60   Pulse 65   Temp 97.8 F (36.6 C) (Oral)   Resp 16   Ht 6\' 2"  (1.88 m)   Wt 225 lb (102.1 kg)   SpO2 98%   BMI 28.89 kg/m  General: Well developed, NAD, BMI noted HEENT:  Normocephalic . Face symmetric, atraumatic Lungs:  CTA B Normal respiratory effort, no intercostal retractions, no accessory muscle use. Heart: RRR,  no murmur.  Abdomen:  Not distended, soft,  non-tender. No rebound or rigidity.   Lower extremities: no pretibial edema bilaterally  Skin: Exposed areas without rash. Not pale. Not jaundice Neurologic:  alert & oriented X3.  Speech normal, gait: Slightly hesitant, wide-based.  Able to transfer by himself with some hesitancy also.   Strength symmetric and appropriate for age.  Psych: Cognition and judgment appear intact.  Cooperative with normal attention span and concentration.  Behavior appropriate. No anxious or depressed appearing.     Assessment    Assessment   DM : insulin dependent,  w/ CAD CHF. 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 NSTMI 11-2021 .  ---CHF, ischemic cardiomyopathy, PVCs (Holter 2019 show 13.6% burden) OSA dx ~08-2017, not on CPAP as off 05-2023, essentially asymptomatic since he lost weight after bariatric surgery Anxiety-depression Essential Tremor dx 2019  Skin cancer Morbid obesity:gastric sleeve, 11-2017 Dr Buzzy Han B12 deficiency Dx 2022 Vitamin D deficiency Dx 05-2022 09-2019 GI bleed, gastric ulcer, required transfusion  PLAN Here for CPX -Td  2014 -PNM 23: 2012, 2020;  prevnar 2016 - zostavax 2014; s/p shingrex   - had a flu shot  - Rec: COVID booster,  Tdap, RSV  --CCS:   Cologuard NEG 01/24/17, C-scope 10/23/19 (done in [place]) , 12/26/19 Dr Barron Alvine, next 5 years per GI letter  -- Prostate ca screening:  no sxs check PSA --Patient education:  ACP information provided --Labs: B12, vitamin D, PSA. DM: Per endocrinology Tremors, gait instability, dizziness. Neurology visit 04/20/2023. Seen for essential tremor, he c/o  gait stability, suspected due to peripheral neuropathy.  Had dizziness, EEG was normal, had a MRI/MRA head and neck, results pending Proteinuria: Saw nephrology 12/2022, + proteinuria but GFR was normal.  Next visit 1 year B12 and vitamin D deficiencies:  On supplements, check labs. H/o OSA: Stopped using a CPAP a while back, most symptoms dissipated after he lost weight from bariatric surgery 2019.  Currently not snoring. H/o skin cancer: Encouraged to see dermatology Anxiety depression: Currently well-controlled on fluoxetine RTC 4 months.

## 2023-06-01 NOTE — Patient Instructions (Addendum)
Vaccines I recommend: Tdap (tetanus) RSV vaccine A COVID booster.   Check the  blood pressure regularly Blood pressure goal:  between 110/65 and  135/85. If it is consistently higher or lower, let me know     GO TO THE LAB : Get the blood work     Next visit with me in 4 to 5 months    Please schedule it at the front desk      "Health Care Power of attorney" ,  "Living will" (Advance care planning documents)  If you already have a living will or healthcare power of attorney, is recommended you bring the copy to be scanned in your chart.   The document will be available to all the doctors you see in the system.  Advance care planning is a process that supports adults in  understanding and sharing their preferences regarding future medical care.  The patient's preferences are recorded in documents called Advance Directives and the can be modified at any time while the patient is in full mental capacity.   If you don't have one, please consider create one.      More information at: [email or web address]

## 2023-06-02 LAB — PSA: PSA: 0.44 ng/mL (ref 0.10–4.00)

## 2023-06-02 LAB — B12 AND FOLATE PANEL
Folate: >24.2 ng/mL (ref >5.9)
Vitamin B-12: 567 pg/mL (ref 211–911)

## 2023-06-02 LAB — VITAMIN D 25 HYDROXY (VIT D DEFICIENCY, FRACTURES): VITD: 15.68 ng/mL — ABNORMAL LOW (ref 30.00–100.00)

## 2023-06-03 ENCOUNTER — Ambulatory Visit: Payer: PPO | Attending: Cardiology

## 2023-06-03 DIAGNOSIS — I5022 Chronic systolic (congestive) heart failure: Secondary | ICD-10-CM

## 2023-06-04 ENCOUNTER — Encounter: Payer: Self-pay | Admitting: Nurse Practitioner

## 2023-06-04 ENCOUNTER — Ambulatory Visit: Payer: PPO | Admitting: Nurse Practitioner

## 2023-06-04 VITALS — BP 130/70 | HR 55 | Ht 74.0 in | Wt 225.4 lb

## 2023-06-04 DIAGNOSIS — I10 Essential (primary) hypertension: Secondary | ICD-10-CM | POA: Diagnosis not present

## 2023-06-04 DIAGNOSIS — I77810 Thoracic aortic ectasia: Secondary | ICD-10-CM | POA: Diagnosis not present

## 2023-06-04 DIAGNOSIS — R42 Dizziness and giddiness: Secondary | ICD-10-CM | POA: Diagnosis not present

## 2023-06-04 DIAGNOSIS — I5022 Chronic systolic (congestive) heart failure: Secondary | ICD-10-CM

## 2023-06-04 DIAGNOSIS — I2510 Atherosclerotic heart disease of native coronary artery without angina pectoris: Secondary | ICD-10-CM

## 2023-06-04 DIAGNOSIS — R001 Bradycardia, unspecified: Secondary | ICD-10-CM

## 2023-06-04 MED ORDER — CARVEDILOL 6.25 MG PO TABS: 6.2500 mg | ORAL_TABLET | Freq: Two times a day (BID) | ORAL | 1 refills | Status: DC

## 2023-06-04 NOTE — Patient Instructions (Addendum)
Medication Instructions:  Your physician has recommended you make the following change in your medication:  Please reduce Coregg to 6.25 Mg twice daily  Continue all other medications as prescribed   Labwork: None  Testing/Procedures: None  Follow-Up: Your physician recommends that you schedule a follow-up appointment in: 3 Months   Any Other Special Instructions Will Be Listed Below (If Applicable).  If you need a refill on your cardiac medications before your next appointment, please call your pharmacy.

## 2023-06-04 NOTE — Progress Notes (Signed)
Cardiology Office Note:  .   Date:  06/04/2023  ID:  Randy Haws., DOB [birth year], MRN [id number] PCP: [name], MD  [facility] Providers Cardiologist:  Randy Dell, MD    History of Present Illness: .   Randy Jalali. is a 72 y.o. male with a PMH of CAD, HTN, HFmrEF, T2DM, OSA on CPAP, and aortic dilatation, who presents today for 6 month follow-up appointment.   Previous history of NSTEMI in 2012.  At that time, received drug-eluting stent to mid LAD.  Cardiac catheterization in 2014 with PTCA alone to ISR of LAD. Cath in 2014 revealing patent stents along LAD, jailed 80% ostial diagonal stenosis.  NSTEMI in April 2023, received drug-eluting stent to D1.  Mildly reduced EF in 2012 at 40% that improved to 55 to 60% in 2013.  In April 2023, EF reduced to 35 to 40%.  EF 40 to 45% 03/2022.  Last seen by Dr. [name] on December 02, 2022.  Blood pressure was elevated in office, patient reported he had not been checking BPs regularly.  Noted occasional mild orthostasis if standing up quickly/bending over.  Was recommended he check BPs at home regularly and report back.   Echocardiogram performed 06/03/2023, results are pending. Today he presents for follow-up. He states he is doing well, but notes that his orthostatic dizziness has been getting worse over the years. Had orthostatics performed with Dr. Arbutus Leas, see results below. Also notices that head movements make dizziness worse. Has been getting thorough evaluation done for this. Denies any chest pain, shortness of breath, palpitations, syncope, presyncope, orthopnea, PND, swelling or significant weight changes, acute bleeding, or claudication.   ROS: Negative. See HPI.  SH: Retired from apartment maintenance work, enjoys Training and development officer.   Studies Reviewed: .    Carotid duplex 02/2023:  IMPRESSION: Mild (1-49%) stenosis proximal right internal carotid artery secondary to heterogenous atherosclerotic plaque.   Mild (1-49%)  stenosis proximal left internal carotid artery secondary to heterogenous atherosclerotic plaque.   Vertebral arteries are patent with normal antegrade flow.   Echo 03/2022:   1. Left ventricular ejection fraction, by estimation, is 40 to 45%. The  left ventricle has mildly decreased function. The left ventricle  demonstrates global hypokinesis. There is moderate concentric left  ventricular hypertrophy. Left ventricular  diastolic parameters are consistent with Grade I diastolic dysfunction  (impaired relaxation).   2. Right ventricular systolic function is normal. The right ventricular  size is normal. There is normal pulmonary artery systolic pressure. The estimated right ventricular systolic pressure is 15.5 mmHg.   3. Left atrial size was severely dilated.   4. The mitral valve is grossly normal. Mild mitral valve regurgitation.   5. The aortic valve is tricuspid. Aortic valve regurgitation is not  visualized. Aortic valve mean gradient measures 3.0 mmHg.   6. Aortic dilatation noted. There is mild dilatation of the aortic root,  measuring 43 mm.   7. The inferior vena cava is normal in size with greater than 50%  respiratory variability, suggesting right atrial pressure of 3 mmHg.   Comparison(s): Prior images reviewed side by side. LVEF has improved somewhat in comparison.  LHC 11/2021:    Prox LAD lesion is 10% stenosed.   1st Diag lesion is 99% stenosed.   A stent was successfully placed.   Post intervention, there is a 0% residual stenosis.   LV end diastolic pressure is normal.   1.  High-grade restenosis of the ostium of jailed  first diagonal treated with 1 drug-eluting stent with POT and kissing balloon inflation in a reverse Culotte configuration. 2.  LVEDP of 7 mmHg.   Recommendation: Dual antiplatelet therapy for ideally 1 year and medical management of coronary artery disease.  Holter monitor 11/2017:  24-hour Holter monitor reviewed. Sinus rhythm is present  throughout. Heart rate ranged from 49 bpm up to 106 bpm with average heart rate 68 bpm. 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 PVC volume represented 13.6% of total beats.  Physical Exam:   VS:  BP 130/70   Pulse (!) 55   Ht 6\' 2"  (1.88 m)   Wt 225 lb 6.4 oz (102.2 kg)   SpO2 97%   BMI 28.94 kg/m    Wt Readings from Last 3 Encounters:  06/04/23 225 lb 6.4 oz (102.2 kg)  06/01/23 225 lb (102.1 kg)  04/20/23 224 lb 9.6 oz (101.9 kg)    Orthostatics from neurology visit on 04/20/2023:  Orthostatic Vitals for the past 48 hrs (Last 6 readings):   Patient Position Orthostatic BP Orthostatic Pulse BP Location  04/20/23 1325 Supine 142/70 63 Left Arm  04/20/23 1326 Sitting 136/70 66 Left Arm  04/20/23 1327 Standing 120/60 67 Left Arm   Orthostatics at office visit today:  Lying: 143/70, 55 bpm Sitting: 130/60, 56 bpm Standing: 137/65, 57 bpm Standing at 3 minutes: 146/68, 59 bpm  GEN: Well nourished, well developed in no acute distress NECK: No JVD; No carotid bruits CARDIAC: S1/S2, slow rate and regular rhythm, no murmurs, rubs, gallops RESPIRATORY:  Clear to auscultation without rales, wheezing or rhonchi  ABDOMEN: Soft, non-tender, non-distended EXTREMITIES:  No edema; No deformity   ASSESSMENT AND PLAN: .    CAD Stable with no anginal symptoms. No indication for ischemic evaluation.  Continue aspirin, atorvastatin, and nitroglycerin as needed.  Will reduce carvedilol to 6.25 mg twice daily to help improve dizziness-see below. Heart healthy diet and regular cardiovascular exercise encouraged.   HFmrEF Stage C, NYHA class I-II symptoms. Echo from 2023 revealed EF 40 to 45%, echo performed yesterday is pending. Euvolemic and well compensated on exam. GDMT limited d/t blood pressures and orthostatic dizziness.  Will reduce carvedilol to 6.25 mg twice daily.  Continue rest of medication regimen. Low sodium diet, fluid restriction <2L, and daily  weights encouraged. Educated to contact our office for weight gain of 2 lbs overnight or 5 lbs in one week.  HTN Blood pressure stable today.  Will reduce carvedilol to 6.25 mg twice daily.  No other medication changes at this time.  BP log given. Discussed to monitor BP at home at least 2 hours after medications and sitting for 5-10 minutes.  Aortic root dilatation Mild dilatation of aortic root noted on echocardiogram in 2023, measuring 43 mm.  Echocardiogram performed yesterday, result is pending. Will continue to monitor.   Orthostatic dizziness, bradycardia Orthostatics performed at neurology visit in August 2024 revealed a 22 mmHg drop in systolic BP upon standing.  Orthostatics obtained today reveal minimal drop that is not significant upon standing.  Heart rate today ranging 55-59 bpm.  Will reduce carvedilol as mentioned above to help improve symptoms.  May need to consider midodrine in future. Discussed conservative measures including wearing compression stockings, changing positions slowly, and staying well-hydrated.  He verbalized understanding.  Appears some vertigo is also contributing to his dizziness as he states head movements make his dizziness worse. Continue follow-up with neurology.  Dispo: Follow-up with me/APP in 3  months or sooner if anything changes.  Signed, Sharlene Dory, NP

## 2023-06-07 LAB — ECHOCARDIOGRAM COMPLETE
AR max vel: 3.15 cm2
AV Area VTI: 2.66 cm2
AV Area mean vel: 2.93 cm2
AV Mean grad: 4 mm[Hg]
AV Peak grad: 5.9 mm[Hg]
Ao pk vel: 1.21 m/s
Area-P 1/2: 2.39 cm2
Calc EF: 37.8 %
MV VTI: 2.9 cm2
S' Lateral: 4.8 cm
Single Plane A2C EF: 35.9 %
Single Plane A4C EF: 40.7 %

## 2023-06-08 ENCOUNTER — Telehealth: Payer: Self-pay | Admitting: Neurology

## 2023-06-08 NOTE — Telephone Encounter (Signed)
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  Its likely not cause of dizziness but he needs neurosx consultation.  If agreeable, refer to Dr. Conchita Paris at [facility], unless pt would rather go to [facility] and I can see if Dr. Marcell Barlow does these

## 2023-06-09 ENCOUNTER — Encounter: Payer: Self-pay | Admitting: Internal Medicine

## 2023-06-09 ENCOUNTER — Other Ambulatory Visit: Payer: Self-pay | Admitting: Internal Medicine

## 2023-06-09 ENCOUNTER — Other Ambulatory Visit: Payer: Self-pay

## 2023-06-09 DIAGNOSIS — Q2730 Arteriovenous malformation, site unspecified: Secondary | ICD-10-CM

## 2023-06-09 MED ORDER — VITAMIN D (ERGOCALCIFEROL) 1.25 MG (50000 UNIT) PO CAPS: 50000.0000 [IU] | ORAL_CAPSULE | ORAL | 0 refills | Status: AC

## 2023-06-09 NOTE — Telephone Encounter (Signed)
Talked to patients wife and she preferred to go to [facility] and so I sent referral

## 2023-06-15 ENCOUNTER — Ambulatory Visit (INDEPENDENT_AMBULATORY_CARE_PROVIDER_SITE_OTHER): Payer: PPO

## 2023-06-15 DIAGNOSIS — Z0000 Encounter for general adult medical examination without abnormal findings: Secondary | ICD-10-CM | POA: Diagnosis not present

## 2023-06-15 NOTE — Progress Notes (Signed)
Subjective:   Randy Castro. is a 72 y.o. male who presents for Medicare Annual/Subsequent preventive examination.  Visit Complete: Virtual I connected with  Randy Castro. on 06/15/23 by a audio enabled telemedicine application and verified that I am speaking with the correct person using two identifiers.  Patient Location: Home  Provider Location: Office/Clinic  I discussed the limitations of evaluation and management by telemedicine. The patient expressed understanding and agreed to proceed.  Vital Signs: Because this visit was a virtual/telehealth visit, some criteria may be missing or patient reported. Any vitals not documented were not able to be obtained and vitals that have been documented are patient reported.      Objective:    There were no vitals filed for this visit. There is no height or weight on file to calculate BMI.     06/15/2023    9:47 AM 04/20/2023   12:50 PM 09/29/2022   10:57 AM 06/10/2022    2:23 PM 04/28/2022    9:04 AM 01/06/2022    8:37 AM 12/05/2021    7:17 PM  Advanced Directives  Does Patient Have a Medical Advance Directive? No Yes Yes No No No No  Type of Advance Directive  Living will       Would patient like information on creating a medical advance directive? No - Patient declined   No - Patient declined  No - Patient declined No - Patient declined    Current Medications (verified) Outpatient Encounter Medications as of 06/15/2023  Medication Sig   aspirin EC 81 MG tablet Take 81 mg by mouth daily.   atorvastatin (LIPITOR) 80 MG tablet Take 1 tablet (80 mg total) by mouth at bedtime.   Blood Glucose Monitoring Suppl (ONE TOUCH ULTRA 2) w/Device KIT by Does not apply route.   carvedilol (COREG) 6.25 MG tablet Take 1 tablet (6.25 mg total) by mouth 2 (two) times daily with a meal.   Cholecalciferol (VITAMIN D3) 50 MCG (2000 UT) CAPS Take 2,000 Units by mouth daily.   Continuous Blood Gluc Receiver (FREESTYLE LIBRE 3 READER) DEVI 1 Device  by Does not apply route every 14 (fourteen) days.   Continuous Glucose Sensor (FREESTYLE LIBRE 3 SENSOR) MISC 1 Device by Does not apply route every 14 (fourteen) days.   dapagliflozin propanediol (FARXIGA) 10 MG TABS tablet Take 1 tablet (10 mg total) by mouth daily.   FLUoxetine (PROZAC) 40 MG capsule Take 1 capsule (40 mg total) by mouth daily.   gabapentin (NEURONTIN) 300 MG capsule Take 1 capsule (300 mg total) by mouth 2 (two) times daily.   glucose blood (ONETOUCH ULTRA) test strip Check blood sugar 2-3 times daily.  Dx Code: E11.9   insulin glargine (LANTUS SOLOSTAR) 100 UNIT/ML Solostar Pen Inject 36 Units into the skin daily.   Insulin Pen Needle 32G X 4 MM MISC 1 Device by Does not apply route daily in the afternoon.   magnesium oxide (MAG-OX) 400 MG tablet Take 1 tablet (400 mg total) by mouth 2 (two) times daily.   metFORMIN (GLUCOPHAGE) 1000 MG tablet Take 1 tablet (1,000 mg total) by mouth 2 (two) times daily with a meal.   Multiple Vitamins-Minerals (MULTIVITAMIN WITH MINERALS) tablet Take 1 tablet by mouth daily.   nitroGLYCERIN (NITROSTAT) 0.4 MG SL tablet Place 1 tablet (0.4 mg total) under the tongue every 5 (five) minutes x 3 doses as needed for chest pain (if no relief after 3rd dose, proceed to ED or call 911).  pantoprazole (PROTONIX) 40 MG tablet Take 1 tablet (40 mg total) by mouth 2 (two) times daily before a meal.   Potassium 99 MG TABS Take 99 mg by mouth daily.   primidone (MYSOLINE) 50 MG tablet TAKE 4 TABLETS BY MOUTH EVERY MORNING and TAKE THREE TABLETS BY MOUTH EVERYDAY AT BEDTIME   sacubitril-valsartan (ENTRESTO) 24-26 MG Take 1 tablet by mouth 2 (two) times daily.   spironolactone (ALDACTONE) 25 MG tablet Take 0.5 tablets (12.5 mg total) by mouth daily. Patient is only taking half due to pharmacy error   vitamin B-12 (CYANOCOBALAMIN) 1000 MCG tablet Take 1,000 mcg by mouth daily.   Vitamin D, Ergocalciferol, (DRISDOL) 1.25 MG (50000 UNIT) CAPS capsule Take 1  capsule (50,000 Units total) by mouth every 7 (seven) days.   No facility-administered encounter medications on file as of 06/15/2023.    Allergies (verified) Ozempic (0.25 or 0.5 mg-dose) [semaglutide(0.25 or 0.5mg -dos)] and Trulicity [dulaglutide]   History: Past Medical History:  Diagnosis Date   Anxiety    Arthritis    BCC (basal cell carcinoma of skin) 12/2020   CAD (coronary artery disease)    a. NSTEMI 12/12: EF 40-45%. UJW:[id number] balloon PTCA + Promus DES x 1 to mid LAD;  b. 11/2012: Cath with Cutting balloon PTCA  LAD for ISR to LAD, EF 55%;  c. 12/2012 Cath/PCI: LAD stents patent, 80 ost Diag (jailed)->PTCA, LCX/RCA patent. d. s/p NSTEMI in 11/2021 with DES to D1   Essential hypertension    GI bleed    History of blood transfusion    was getting 4 units   Hyperlipidemia    Ischemic cardiomyopathy    a.  echo 08/06/11: dist ant wall, apical and septal and infero-apical HK, mild LVH, EF 40%, mild LAE, PASP 34, asc aorta mildly dilated, mild TR;  b.  Echo (3/13) showed recovery of LV systolic function with EF 55-60%, grade I diastolic dysfunction, mild MR.    Myocardial infarction [facility])    twice with NSTEMI 2012   Palpitations    Type 2 diabetes mellitus [facility])    Past Surgical History:  Procedure Laterality Date   ANTERIOR CERVICAL DECOMP/DISCECTOMY FUSION  in the 90s   CHOLECYSTECTOMY  03/27/2019   COLONOSCOPY  2021   COLONOSCOPY WITH ESOPHAGOGASTRODUODENOSCOPY (EGD)  10/2019   CORONARY ANGIOPLASTY  12/15/2012; 12/22/2012   CORONARY ANGIOPLASTY WITH STENT PLACEMENT  07/2011   "1" (12/22/2012)   CORONARY STENT INTERVENTION N/A 12/08/2021   Procedure: CORONARY STENT INTERVENTION;  Surgeon: Orbie Pyo, MD;  Location: [facility] INVASIVE CV LAB;  Service: Cardiovascular;  Laterality: N/A;   GASTRIC BYPASS  12/21/2017   KNEE ARTHROSCOPY Left 12/31/2016   LEFT HEART CATH AND CORONARY ANGIOGRAPHY N/A 12/08/2021   Procedure: LEFT HEART CATH AND CORONARY ANGIOGRAPHY;  Surgeon:  Orbie Pyo, MD;  Location: [facility] INVASIVE CV LAB;  Service: Cardiovascular;  Laterality: N/A;   LEFT HEART CATHETERIZATION WITH CORONARY ANGIOGRAM N/A 08/06/2011   Procedure: LEFT HEART CATHETERIZATION WITH CORONARY ANGIOGRAM;  Surgeon: Kathleene Hazel, MD;  Location: [facility] CATH LAB;  Service: Cardiovascular;  Laterality: N/A;   LEFT HEART CATHETERIZATION WITH CORONARY ANGIOGRAM N/A 12/15/2012   Procedure: LEFT HEART CATHETERIZATION WITH CORONARY ANGIOGRAM;  Surgeon: Tonny Bollman, MD;  Location: [facility] CATH LAB;  Service: Cardiovascular;  Laterality: N/A;   LEFT HEART CATHETERIZATION WITH CORONARY ANGIOGRAM N/A 12/22/2012   Procedure: LEFT HEART CATHETERIZATION WITH CORONARY ANGIOGRAM;  Surgeon: Tonny Bollman, MD;  Location: [facility] CATH LAB;  Service: Cardiovascular;  Laterality: N/A;  PERCUTANEOUS CORONARY STENT INTERVENTION (PCI-S) Right 12/15/2012   Procedure: PERCUTANEOUS CORONARY STENT INTERVENTION (PCI-S);  Surgeon: Tonny Bollman, MD;  Location: [facility] CATH LAB;  Service: Cardiovascular;  Laterality: Right;   Family History  Problem Relation Age of Onset   Diabetes Brother    Lung cancer Brother    Diabetes Mother    Coronary artery disease Father    Heart attack Father 44   Diabetes Sister    Coronary artery disease Sister    Colon cancer Neg Hx    Prostate cancer Neg Hx    Stroke Neg Hx    Esophageal cancer Neg Hx    Colon polyps Neg Hx    Rectal cancer Neg Hx    Stomach cancer Neg Hx    Social History   Socioeconomic History   Marital status: Married    Spouse name: Not on file   Number of children: 2    Years of education: Not on file   Highest education level: Not on file  Occupational History   Occupation: Environmental consultant  Tobacco Use   Smoking status: Former    Current packs/day: 0.00    Average packs/day: 1 pack/day for 25.0 years (25.0 ttl pk-yrs)    Types: Cigarettes    Start date: 04/13/1967    Quit date: 04/12/1992    Years since quitting: 31.1     Passive exposure: Never   Smokeless tobacco: Never   Tobacco comments:    Quit tobacco > 30 years ago  Vaping Use   Vaping status: Never Used  Substance and Sexual Activity   Alcohol use: Not Currently    Comment: 12/22/2012 "rarely maybe every 3-4 months, beer"   Drug use: No   Sexual activity: Yes  Other Topics Concern   Not on file  Social History Narrative   Moved back to [place] from [facility] 2011   Household-- pt and wife   Hobby: Industrial/product designer   Social Determinants of Health   Financial Resource Strain: Low Risk  (06/15/2023)   Overall Financial Resource Strain (CARDIA)    Difficulty of Paying Living Expenses: Not hard at all  Food Insecurity: No Food Insecurity (06/15/2023)   Hunger Vital Sign    Worried About Running Out of Food in the Last Year: Never true    Ran Out of Food in the Last Year: Never true  Transportation Needs: No Transportation Needs (06/15/2023)   PRAPARE - Administrator, Civil Service (Medical): No    Lack of Transportation (Non-Medical): No  Physical Activity: Insufficiently Active (06/15/2023)   Exercise Vital Sign    Days of Exercise per Week: 3 days    Minutes of Exercise per Session: 30 min  Stress: No Stress Concern Present (06/15/2023)   [employer] of Occupational Health - Occupational Stress Questionnaire    Feeling of Stress : Not at all  Social Connections: Moderately Integrated (06/15/2023)   Social Connection and Isolation Panel [NHANES]    Frequency of Communication with Friends and Family: Not on file    Frequency of Social Gatherings with Friends and Family: Three times a week    Attends Religious Services: More than 4 times per year    Active Member of Clubs or Organizations: No    Attends Banker Meetings: Never    Marital Status: Married    Tobacco Counseling Counseling given: Not Answered Tobacco comments: Quit tobacco > 30 years ago   Clinical Intake:  Pre-visit preparation completed:  Yes  Pain : No/denies pain     Nutritional Status: BMI 25 -29 Overweight Nutritional Risks: None Diabetes: Yes CBG done?: No Did pt. bring in CBG monitor from home?: No  How often do you need to have someone help you when you read instructions, pamphlets, or other written materials from your doctor or pharmacy?: 1 - Never  Interpreter Needed?: No  Information entered by :: [name], LPN   Activities of Daily Living    06/15/2023    9:47 AM  In your present state of health, do you have any difficulty performing the following activities:  Hearing? 0  Vision? 0  Difficulty concentrating or making decisions? 0  Walking or climbing stairs? 0  Dressing or bathing? 0  Doing errands, shopping? 0  Preparing Food and eating ? N  Using the Toilet? N  In the past six months, have you accidently leaked urine? N  Do you have problems with loss of bowel control? N  Managing your Medications? N  Managing your Finances? N  Housekeeping or managing your Housekeeping? N    Patient Care Team: [name], MD as PCP - General (Internal Medicine) Jonelle Sidle, MD as PCP - Cardiology (Cardiology) Laurey Morale, MD as Consulting Physician (Cardiology) Erling Cruz., MD as Referring Physician (Gastroenterology) Horald Pollen, PA-C as Physician Assistant (Pulmonary Disease) Lanice Schwab, MD as Consulting Physician (Bariatrics) [name], Octaviano Batty, DO as Consulting Physician (Neurology) Henrene Pastor, RPH-CPP (Pharmacist) Daisy Lazar, DO (Optometry) Shamleffer, Konrad Dolores, MD as Consulting Physician (Endocrinology)  Indicate any recent Medical Services you may have received from other than [facility] providers in the past year (date may be approximate).     Assessment:   This is a routine wellness examination for Brigido.  Hearing/Vision screen Hearing Screening - Comments:: No aids Vision Screening - Comments:: Wears glasses- [facility]   Goals Addressed              This Visit's Progress    DIET - EAT MORE FRUITS AND VEGETABLES         Depression Screen    06/15/2023    9:45 AM 06/01/2023    1:29 PM 01/29/2023   10:22 AM 09/30/2022   10:13 AM 09/30/2022    9:30 AM 06/10/2022    2:24 PM 05/29/2022    9:28 AM  PHQ 2/9 Scores  PHQ - 2 Score 0 1 1 0 0 0 1  PHQ- 9 Score 0 3 3 5        Fall Risk    06/15/2023    9:47 AM 06/01/2023   12:59 PM 01/29/2023    9:56 AM 09/30/2022    9:30 AM 09/29/2022   10:57 AM  Fall Risk   Falls in the past year? 0 0 1 1 1   Number falls in past yr: 0 0 0 1 1  Injury with Fall? 0 0 0 0 0  Risk for fall due to : No Fall Risks      Follow up Falls prevention discussed Falls evaluation completed Falls evaluation completed Falls evaluation completed Falls evaluation completed    MEDICARE RISK AT HOME: Medicare Risk at Home Any stairs in or around the home?: Yes If so, are there any without handrails?: No Home free of loose throw rugs in walkways, pet beds, electrical cords, etc?: Yes Adequate lighting in your home to reduce risk of falls?: Yes Life alert?: No Use of a cane, walker or w/c?: Yes (cane occasionally d/t knee pain) Grab  bars in the bathroom?: No Shower chair or bench in shower?: No Elevated toilet seat or a handicapped toilet?: Yes  TIMED UP AND GO:  Was the test performed?  No    Cognitive Function:    04/27/2017    9:35 AM  MMSE - Mini Mental State Exam  Orientation to time 5  Orientation to Place 5  Registration 3  Attention/ Calculation 5  Recall 3  Language- name 2 objects 2  Language- repeat 1  Language- follow 3 step command 3  Language- read & follow direction 1  Write a sentence 1  Copy design 1  Total score 30        06/10/2022    2:31 PM  6CIT Screen  What Year? 0 points  What month? 0 points  What time? 0 points  Count back from 20 0 points  Months in reverse 0 points  Repeat phrase 0 points  Total Score 0 points    Immunizations Immunization History  Administered  Date(s) Administered   Fluad Quad(high Dose 65+) 05/04/2019, 05/29/2022   Influenza Split 06/30/2011   Influenza, High Dose Seasonal PF 05/31/2013, 05/27/2017, 06/07/2018   Influenza, Seasonal, Injecte, Preservative Fre 07/28/2012   Influenza,inj,Quad PF,6+ Mos 05/29/2014, 06/07/2015, 04/23/2016   Influenza-Unspecified 05/24/2020, 06/24/2021, 04/07/2023   Moderna Covid-19 Vaccine Bivalent Booster 8yrs & up 08/29/2021   PFIZER(Purple Top)SARS-COV-2 Vaccination 10/11/2019, 11/01/2019, 05/20/2020   Pneumococcal Conjugate-13 12/05/2014   Pneumococcal Polysaccharide-23 08/07/2011, 10/12/2018   Tdap 05/31/2013   Zoster Recombinant(Shingrix) 04/17/2021, 06/24/2021   Zoster, Live 11/28/2012    TDAP status: Due, Education has been provided regarding the importance of this vaccine. Advised may receive this vaccine at local pharmacy or Health Dept. Aware to provide a copy of the vaccination record if obtained from local pharmacy or Health Dept. Verbalized acceptance and understanding.  Flu Vaccine status: Up to date  Pneumococcal vaccine status: Up to date  Covid-19 vaccine status: Completed vaccines  Qualifies for Shingles Vaccine? Yes   Zostavax completed Yes   Shingrix Completed?: Yes  Screening Tests Health Maintenance  Topic Date Due   HEMOGLOBIN A1C  03/31/2023   DTaP/Tdap/Td (2 - Td or Tdap) 06/01/2023   FOOT EXAM  11/25/2023   Diabetic kidney evaluation - Urine ACR  12/21/2023   Diabetic kidney evaluation - eGFR measurement  01/29/2024   OPHTHALMOLOGY EXAM  05/20/2024   Medicare Annual Wellness (AWV)  06/14/2024   Colonoscopy  12/25/2024   Pneumonia Vaccine 76+ Years old  Completed   INFLUENZA VACCINE  Completed   Hepatitis C Screening  Completed   Zoster Vaccines- Shingrix  Completed   HPV VACCINES  Aged Out   COVID-19 Vaccine  Discontinued   Fecal DNA (Cologuard)  Discontinued    Health Maintenance  Health Maintenance Due  Topic Date Due   HEMOGLOBIN A1C   03/31/2023   DTaP/Tdap/Td (2 - Td or Tdap) 06/01/2023    Colorectal cancer screening: Type of screening: Colonoscopy. Completed 12/26/19. Repeat every 5 years  Lung Cancer Screening: (Low Dose CT Chest recommended if Age 21-80 years, 20 pack-year currently smoking OR have quit w/in 15years.) does not qualify.    Additional Screening:  Hepatitis C Screening: does qualify; Completed 06/07/15  Vision Screening: Recommended annual ophthalmology exams for early detection of glaucoma and other disorders of the eye. Is the patient up to date with their annual eye exam?  Yes  Who is the provider or what is the name of the office in which the patient attends annual  eye exams? [facility] If pt is not established with a provider, would they like to be referred to a provider to establish care? No .   Dental Screening: Recommended annual dental exams for proper oral hygiene  Diabetic Foot Exam: Diabetic Foot Exam: Completed 11/25/22  Community Resource Referral / Chronic Care Management: CRR required this visit?  No   CCM required this visit?  No     Plan:     I have personally reviewed and noted the following in the patient's chart:   Medical and social history Use of alcohol, tobacco or illicit drugs  Current medications and supplements including opioid prescriptions. Patient is not currently taking opioid prescriptions. Functional ability and status Nutritional status Physical activity Advanced directives List of other physicians Hospitalizations, surgeries, and ER visits in previous 12 months Vitals Screenings to include cognitive, depression, and falls Referrals and appointments  In addition, I have reviewed and discussed with patient certain preventive protocols, quality metrics, and best practice recommendations. A written personalized care plan for preventive services as well as general preventive health recommendations were provided to patient.     Hal Hope,  LPN   [id number]   After Visit Summary: (MyChart) Due to this being a telephonic visit, the after visit summary with patients personalized plan was offered to patient via MyChart   Nurse Notes: none

## 2023-06-15 NOTE — Patient Instructions (Signed)
Randy Castro , Thank you for taking time to come for your Medicare Wellness Visit. I appreciate your ongoing commitment to your health goals. Please review the following plan we discussed and let me know if I can assist you in the future.   Referrals/Orders/Follow-Ups/Clinician Recommendations: none  This is a list of the screening recommended for you and due dates:  Health Maintenance  Topic Date Due   Hemoglobin A1C  03/31/2023   DTaP/Tdap/Td vaccine (2 - Td or Tdap) 06/01/2023   Complete foot exam   11/25/2023   Yearly kidney health urinalysis for diabetes  12/21/2023   Yearly kidney function blood test for diabetes  01/29/2024   Eye exam for diabetics  05/20/2024   Medicare Annual Wellness Visit  06/14/2024   Colon Cancer Screening  12/25/2024   Pneumonia Vaccine  Completed   Flu Shot  Completed   Hepatitis C Screening  Completed   Zoster (Shingles) Vaccine  Completed   HPV Vaccine  Aged Out   COVID-19 Vaccine  Discontinued   Cologuard (Stool DNA test)  Discontinued    Advanced directives: (ACP Link)Information on Advanced Care Planning can be found at [facility] Advance Health Care Directives Advance Health Care Directives ([email or web address]   Next Medicare Annual Wellness Visit scheduled for next year: Yes   06/15/24 @ 9:00 am by video

## 2023-07-03 ENCOUNTER — Encounter: Payer: Self-pay | Admitting: Neurology

## 2023-07-26 DIAGNOSIS — Q282 Arteriovenous malformation of cerebral vessels: Secondary | ICD-10-CM | POA: Diagnosis not present

## 2023-07-28 ENCOUNTER — Ambulatory Visit: Payer: PPO | Admitting: Pharmacist

## 2023-07-28 DIAGNOSIS — R42 Dizziness and giddiness: Secondary | ICD-10-CM

## 2023-07-28 DIAGNOSIS — Z794 Long term (current) use of insulin: Secondary | ICD-10-CM

## 2023-07-28 DIAGNOSIS — I10 Essential (primary) hypertension: Secondary | ICD-10-CM

## 2023-07-28 DIAGNOSIS — E1165 Type 2 diabetes mellitus with hyperglycemia: Secondary | ICD-10-CM

## 2023-07-28 NOTE — Progress Notes (Signed)
Pharmacy Note  07/28/2023 Name: Randy Castro. MRN: [id number] DOB: Dec 30, 1950  Subjective: Randy Haws. is a 72 y.o. year old male who is a primary care patient of [name], MD. Clinical Pharmacist Practitioner referral was placed to assist with medication management.    Engaged with patient and his wife by telephone for follow up visit today.  Patients wife reports that Randy Castro continues to experience dizziness. At his last visit with cardiologist his dose of carvedilol was lowered from 12.5mg  twice a day to 6.25mg  twice a day. Patient reports this dose adjustment did improve dizziness but he is still experiencing dizziness frequently. He has considered stopping carvedilol altogether.  He had had PT evaluation / treatment about 2 years ago for positional vertigo. He attended a few sessions but states it was not helpful.  Randy Castro does see neurologist. 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 Compared to 12/04/2020 study, mild progression notes. ALso There may be an overlapping L3-4 radiculopathy affecting lower extremities bilaterally which is new.  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 Its likely not cause of dizziness but he needs neurosx consultation"   Type 2 DM:  Last A1c was 7.2%.  Managed by [name]  -  Current therapy: Lantus which was 36 units daily; Farxiga 10mg  daily and metformin 1000mg  twice a day.  Previous medications tried: Ozempic stopped due to severe nausea and weakness.   Patient has been using Continuous Glucose Monitor to check blood glucose.  Reports blood glucose mostly 100 to 150. Has has 2 lows during the next in the last 2 months.     HFmrEF / HTN:  Current therapy: Entresto 24/26mg  twice a day, Farxiga 10mg  daily, carvedilol 6.25mg  twice a day and spironolactone 25mg   1/2 tablet daily  Adherence with Entresto improved since qualified he for [facility]. Cost of [name] is current $0.  EF 04/23/2022 was 40-45% EF 05/2023 was also 40-45%   BP Readings from Last 3 Encounters:  06/04/23 130/70  06/01/23 128/60  01/29/23 130/68    Medication management:  Continues to use adhernece packaging with good results. He is using [facility] for adherence packaging He has a [employer] for CHF medications to cover cost of [place]. [name] is active thru 02/13/2023.  Plans to continue with [employer] plan in 2025.   Objective: Review of patient status, including review of consultants reports, laboratory and other test data, was performed as part of comprehensive.  Lab Results  Component Value Date   CREATININE 0.85 01/29/2023   CREATININE 0.61 09/30/2022   CREATININE 0.91 05/27/2022    Lab Results  Component Value Date   HGBA1C 7.2 (H) 09/30/2022       Component Value Date/Time   CHOL 117 01/29/2023 1029   TRIG 321.0 (H) 01/29/2023 1029   HDL 43.30 01/29/2023 1029   CHOLHDL 3 01/29/2023 1029   VLDL 64.2 (H) 01/29/2023 1029   LDLCALC 40 11/27/2021 0930   LDLDIRECT 50.0 01/29/2023 1029     Clinical ASCVD: Yes  The ASCVD Risk score (Arnett DK, et al., 2019) failed to calculate for the following reasons:   The patient has a prior MI or stroke diagnosis    BP Readings from Last 3 Encounters:  06/04/23 130/70  06/01/23 128/60  01/29/23 130/68     Allergies  Allergen Reactions   Ozempic (0.25 Or 0.5 Mg-Dose) [Semaglutide(0.25 Or 0.5mg -Dos)] Nausea Only   Trulicity [Dulaglutide] Nausea And Vomiting    Medications Reviewed Today   Medications were not reviewed in this encounter     Patient Active Problem List   Diagnosis Date Noted   Diabetes mellitus (HCC) 05/27/2022   Elevated troponin 12/06/2021   GERD (gastroesophageal reflux disease) 12/06/2021   Type 2 diabetes mellitus with  hyperglycemia, with long-term current use of insulin (HCC) 11/18/2021   Type 2 diabetes mellitus with diabetic polyneuropathy, with long-term current use of insulin (HCC) 11/18/2021   BCC (basal cell carcinoma of skin) 12/30/2020   Iliac artery aneurysm, 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/01/2019   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   Abdominal pain 11/17/2018   Anemia 11/14/2017   OSA (obstructive sleep apnea) 09/21/2017   Obesity 01/23/2016   PCP NOTES >>> 06/08/2015   Anxiety and depression 05/31/2013   Intermediate coronary syndrome (HCC) 12/22/2012   Unstable angina (HCC) 12/16/2012   Fatigue 04/26/2012   Annual physical exam 10/28/2011   Rash 10/28/2011   Coronary Artery Disease 08/28/2011   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  Enrollment ends 01/2023 Reapplied for [employer] today with patient - approved 02/13/2023 thru 02/14/2024    Assessment / Plan: Type 2 DM: A1c improving - goal < 7.5% Continue Lantus to 36 units daily, Farxiga 10mg  daily and metformin 1000mg  twice a day.  Continue to use Continuous Glucose Monitor to check blood glucose and monitor blood glucose trends.  Continue as planned to follow up with [name] 09/24/2023  HFmrEF / HTN:  HF controlled; blood pressure at goal Continue spironolactone, carvedilol, Marcelline Deist and Entresto  Recommended he restart checking blood pressure at home - especially since blood pressure meds have been changed recently and reports of dizziness.   Dizziness: Multiple factors could be contributing of dizziness Medications that could contribute to his  dizziness discussed - carvedilol, Farxiga, Entresto, spironolactone, gabapentin and primidone.  Discussed the benefits of cardiovascular medications and advised that he not stop any without consulting with his cardiologist.  Will reach out to his cardiology office about dizziness - could consider lowering carvedilol dose again to 3.125mg  twice a day or lower Farxiga to 5mg  daily .  Reminded patient to use compression stocking, stand slowly and stay hydrated per last cardiology notes.   Medication management:  Continue to use adhernece packaging  Reviewed and updated medication list Reviewed refill history and adherence  Follow Up:  Telephone follow up appointment with care management team member scheduled for:  5 months, unless needed sooner - pt to call for appointment.   Henrene Pastor, PharmD Clinical Pharmacist [facility]  - [facility] [phone]

## 2023-07-29 ENCOUNTER — Other Ambulatory Visit: Payer: Self-pay | Admitting: Neurology

## 2023-07-29 DIAGNOSIS — G250 Essential tremor: Secondary | ICD-10-CM

## 2023-07-30 ENCOUNTER — Other Ambulatory Visit: Payer: Self-pay | Admitting: Neurosurgery

## 2023-07-30 DIAGNOSIS — Q282 Arteriovenous malformation of cerebral vessels: Secondary | ICD-10-CM

## 2023-08-27 ENCOUNTER — Other Ambulatory Visit (HOSPITAL_COMMUNITY): Payer: PPO

## 2023-08-27 ENCOUNTER — Other Ambulatory Visit: Payer: Self-pay | Admitting: Internal Medicine

## 2023-09-06 ENCOUNTER — Ambulatory Visit: Payer: PPO | Attending: Nurse Practitioner | Admitting: Nurse Practitioner

## 2023-09-06 ENCOUNTER — Encounter: Payer: Self-pay | Admitting: Nurse Practitioner

## 2023-09-06 VITALS — BP 132/74 | HR 75 | Ht 74.0 in | Wt 228.6 lb

## 2023-09-06 DIAGNOSIS — I5022 Chronic systolic (congestive) heart failure: Secondary | ICD-10-CM

## 2023-09-06 DIAGNOSIS — I77810 Thoracic aortic ectasia: Secondary | ICD-10-CM

## 2023-09-06 DIAGNOSIS — R42 Dizziness and giddiness: Secondary | ICD-10-CM

## 2023-09-06 DIAGNOSIS — I10 Essential (primary) hypertension: Secondary | ICD-10-CM | POA: Diagnosis not present

## 2023-09-06 DIAGNOSIS — I2510 Atherosclerotic heart disease of native coronary artery without angina pectoris: Secondary | ICD-10-CM | POA: Diagnosis not present

## 2023-09-06 DIAGNOSIS — R5383 Other fatigue: Secondary | ICD-10-CM

## 2023-09-06 NOTE — Progress Notes (Signed)
 Cardiology Office Note:  .   Date:  09/06/2023  ID:  Randy Castro., DOB 27-Aug-1950, MRN [id number] PCP: Randy Aloysius BRAVO, MD  [facility] Providers Cardiologist:  Randy Sierras, MD    History of Present Illness: .   Randy Castro. is a 73 y.o. male with a PMH of CAD, HTN, HFmrEF, T2DM, OSA on CPAP, and aortic dilatation, who presents today for 6 month follow-up appointment.   Previous history of NSTEMI in 2012.  At that time, received drug-eluting stent to mid LAD.  Cardiac catheterization in 2014 with PTCA alone to ISR of LAD. Cath in 2014 revealing patent stents along LAD, jailed 80% ostial diagonal stenosis.  NSTEMI in April 2023, received drug-eluting stent to D1.  Mildly reduced EF in 2012 at 40% that improved to 55 to 60% in 2013.  In April 2023, EF reduced to 35 to 40%.  EF 40 to 45% 03/2022.  Last seen by Dr. Sierras on December 02, 2022.  Blood pressure was elevated in office, patient reported he had not been checking BPs regularly.  Noted occasional mild orthostasis if standing up quickly/bending over.  Was recommended he check BPs at home regularly and report back.   06/04/2023 - Today he presents for follow-up. He states he is doing well, but notes that his orthostatic dizziness has been getting worse over the years. Had orthostatics performed with Dr. Evonnie, see results below. Also notices that head movements make dizziness worse. Has been getting thorough evaluation done for this. Denies any chest pain, shortness of breath, palpitations, syncope, presyncope, orthopnea, PND, swelling or significant weight changes, acute bleeding, or claudication.   09/06/2023 -presents today for follow-up.  Says his dizziness has greatly improved after carvedilol  dose was lowered at last office visit.  Chief concern today is feeling fatigued for the past 2 to 3 weeks.  He is unsure of the cause.  He says he is sleeping well at night, however says he wakes up feeling tired and goes to bed feeling  tired.  He partly attributes this to being very busy with his woodworking around the holiday time. Denies any chest pain, shortness of breath, palpitations, syncope, presyncope, dizziness, orthopnea, PND, swelling or significant weight changes, acute bleeding, or claudication.  ROS: Negative. See HPI.  SH: Retired from apartment maintenance work, enjoys training and development officer.   Studies Reviewed: .    Echocardiogram 05/2023: 1. Left ventricular ejection fraction, by estimation, is 40 to 45%. The  left ventricle has mildly decreased function. The left ventricle has no  regional wall motion abnormalities. There is mild left ventricular  hypertrophy. Left ventricular diastolic  parameters are consistent with Grade I diastolic dysfunction (impaired  relaxation). The average left ventricular global longitudinal strain is  -12.7 %. The global longitudinal strain is abnormal.   2. Right ventricular systolic function is normal. The right ventricular  size is normal. Tricuspid regurgitation signal is inadequate for assessing  PA pressure.   3. Left atrial size was moderately dilated.   4. The mitral valve is normal in structure. Trivial mitral valve  regurgitation. No evidence of mitral stenosis.   5. The aortic valve is tricuspid. Aortic valve regurgitation is not  visualized. No aortic stenosis is present.   6. Aortic dilatation noted. There is mild dilatation of the aortic root,  measuring 41 mm.   7. The inferior vena cava is normal in size with greater than 50%  respiratory variability, suggesting right atrial pressure of 3 mmHg.  Comparison(s): EF 40-45%. Moderate LVH. Mild mitral regurgitation.   Carotid duplex 02/2023:  IMPRESSION: Mild (1-49%) stenosis proximal right internal carotid artery secondary to heterogenous atherosclerotic plaque.   Mild (1-49%) stenosis proximal left internal carotid artery secondary to heterogenous atherosclerotic plaque.   Vertebral arteries are patent with  normal antegrade flow.   Echo 03/2022:   1. Left ventricular ejection fraction, by estimation, is 40 to 45%. The  left ventricle has mildly decreased function. The left ventricle  demonstrates global hypokinesis. There is moderate concentric left  ventricular hypertrophy. Left ventricular  diastolic parameters are consistent with Grade I diastolic dysfunction  (impaired relaxation).   2. Right ventricular systolic function is normal. The right ventricular  size is normal. There is normal pulmonary artery systolic pressure. The estimated right ventricular systolic pressure is 15.5 mmHg.   3. Left atrial size was severely dilated.   4. The mitral valve is grossly normal. Mild mitral valve regurgitation.   5. The aortic valve is tricuspid. Aortic valve regurgitation is not  visualized. Aortic valve mean gradient measures 3.0 mmHg.   6. Aortic dilatation noted. There is mild dilatation of the aortic root,  measuring 43 mm.   7. The inferior vena cava is normal in size with greater than 50%  respiratory variability, suggesting right atrial pressure of 3 mmHg.   Comparison(s): Prior images reviewed side by side. LVEF has improved somewhat in comparison.  LHC 11/2021:    Prox LAD lesion is 10% stenosed.   1st Diag lesion is 99% stenosed.   A stent was successfully placed.   Post intervention, there is a 0% residual stenosis.   LV end diastolic pressure is normal.   1.  High-grade restenosis of the ostium of jailed first diagonal treated with 1 drug-eluting stent with POT and kissing balloon inflation in a reverse [place] configuration. 2.  LVEDP of 7 mmHg.   Recommendation: Dual antiplatelet therapy for ideally 1 year and medical management of coronary artery disease.  Holter monitor 11/2017:  24-hour Holter monitor reviewed. Sinus rhythm is present throughout. Heart rate ranged from 49 bpm up to 106 bpm with average heart rate 68 bpm. Frequent PVCs were noted, singles and couplets, but no  sustained arrhythmias. PVC volume represented 13.6% of total beats.  Physical Exam:   VS:  BP 132/74   Pulse 75   Ht 6' 2 (1.88 m)   Wt 228 lb 9.6 oz (103.7 kg)   SpO2 98%   BMI 29.35 kg/m    Wt Readings from Last 3 Encounters:  09/06/23 228 lb 9.6 oz (103.7 kg)  06/04/23 225 lb 6.4 oz (102.2 kg)  06/01/23 225 lb (102.1 kg)   GEN: Well nourished, well developed in no acute distress NECK: No JVD; No carotid bruits CARDIAC: S1/S2, RRR, no murmurs, rubs, gallops RESPIRATORY:  Clear to auscultation without rales, wheezing or rhonchi  ABDOMEN: Soft, non-tender, non-distended EXTREMITIES:  No edema; No deformity   ASSESSMENT AND PLAN: .    CAD Stable with no anginal symptoms. No indication for ischemic evaluation.  Continue aspirin , atorvastatin , carvedilol , and nitroglycerin  as needed. Heart healthy diet and regular cardiovascular exercise encouraged.   HFmrEF Stage C, NYHA class I-II symptoms. Echo 05/2023 revealed stable EF 40 to 45%. Euvolemic and well compensated on exam. GDMT limited d/t blood pressures and orthostatic dizziness.  Continue current medication regimen. Low sodium diet, fluid restriction <2L, and daily weights encouraged. Educated to contact our office for weight gain of 2 lbs overnight or 5  lbs in one week.  HTN Blood pressure stable today.  Continue current medication regimen.   Discussed to monitor BP at home at least 2 hours after medications and sitting for 5-10 minutes.  Aortic root dilatation Stable mild dilatation of aortic root noted on echocardiogram 05/2023, measuring 41 mm.  Will continue to monitor.  Recommend updating echo in October 2025 for further evaluation.  Orthostatic dizziness Orthostatics performed during past neurology visit in August 2024 revealed a 22 mmHg drop in systolic BP upon standing.  Orthostatics at last office visit revealed minimal drop that is not significant upon standing.  He notes a major improvement in his symptoms after  carvedilol  dosage was reduced.  Denies any recent symptoms.  May need to consider midodrine in future.  Previously discussed conservative measures including wearing compression stockings, changing positions slowly, and staying well-hydrated.  He verbalized understanding. Continue follow-up with neurology.  6.  Fatigue Etiology multifactorial.  Vitamin D  level checked with PCPs office was low at 15.68, his primary care provider stated a prescription for vitamin D  supplement would be sent to his pharmacy.  Will route note to PCP as patient states he never received this prescription. Continue to follow with PCP who is managing this.  Dispo: Follow-up with Dr. Debera or APP in 6 months or sooner if anything changes.  Signed, Almarie Crate, NP

## 2023-09-06 NOTE — Patient Instructions (Addendum)

## 2023-09-07 ENCOUNTER — Other Ambulatory Visit: Payer: Self-pay | Admitting: Internal Medicine

## 2023-09-24 ENCOUNTER — Encounter: Payer: Self-pay | Admitting: Internal Medicine

## 2023-09-24 ENCOUNTER — Ambulatory Visit: Payer: PPO | Admitting: Internal Medicine

## 2023-09-24 VITALS — BP 130/80 | HR 60 | Ht 74.0 in | Wt 230.0 lb

## 2023-09-24 DIAGNOSIS — E1159 Type 2 diabetes mellitus with other circulatory complications: Secondary | ICD-10-CM | POA: Diagnosis not present

## 2023-09-24 DIAGNOSIS — E1165 Type 2 diabetes mellitus with hyperglycemia: Secondary | ICD-10-CM

## 2023-09-24 DIAGNOSIS — E1142 Type 2 diabetes mellitus with diabetic polyneuropathy: Secondary | ICD-10-CM

## 2023-09-24 DIAGNOSIS — Z794 Long term (current) use of insulin: Secondary | ICD-10-CM

## 2023-09-24 LAB — POCT GLYCOSYLATED HEMOGLOBIN (HGB A1C): Hemoglobin A1C: 7.9 % — AB (ref 4.0–5.6)

## 2023-09-24 LAB — POCT GLUCOSE (DEVICE FOR HOME USE): Glucose Fasting, POC: 112 mg/dL — AB (ref 70–99)

## 2023-09-24 MED ORDER — LANTUS SOLOSTAR 100 UNIT/ML ~~LOC~~ SOPN: 36.0000 [IU] | PEN_INJECTOR | Freq: Every day | SUBCUTANEOUS | 3 refills | Status: DC

## 2023-09-24 MED ORDER — DAPAGLIFLOZIN PROPANEDIOL 10 MG PO TABS: 10.0000 mg | ORAL_TABLET | Freq: Every day | ORAL | 3 refills | Status: DC

## 2023-09-24 MED ORDER — METFORMIN HCL 1000 MG PO TABS: 1000.0000 mg | ORAL_TABLET | Freq: Two times a day (BID) | ORAL | 3 refills | Status: DC

## 2023-09-24 MED ORDER — INSULIN PEN NEEDLE 32G X 4 MM MISC: 1.0000 | Freq: Every day | 3 refills | Status: DC

## 2023-09-24 NOTE — Patient Instructions (Signed)
-   Continue Lantus 36 units ONCE daily  - Continue Metformin 1000 mg, 1 tablet with Breakfast and Supper - Continue Farxiga 10 mg, 1 tablet every morning       HOW TO TREAT LOW BLOOD SUGARS (Blood sugar LESS THAN 70 MG/DL) Please follow the RULE OF 15 for the treatment of hypoglycemia treatment (when your (blood sugars are less than 70 mg/dL)   STEP 1: Take 15 grams of carbohydrates when your blood sugar is low, which includes:  3-4 GLUCOSE TABS  OR 3-4 OZ OF JUICE OR REGULAR SODA OR ONE TUBE OF GLUCOSE GEL    STEP 2: RECHECK blood sugar in 15 MINUTES STEP 3: If your blood sugar is still low at the 15 minute recheck --> then, go back to STEP 1 and treat AGAIN with another 15 grams of carbohydrates.

## 2023-09-24 NOTE — Progress Notes (Signed)
Name: Tyshun Tuckerman.  Age/ Sex: 73 y.o., male   MRN/ DOB: [id number], October 04, 1950     PCP: [name], MD   Reason for Endocrinology Evaluation: Type 2 Diabetes Mellitus  Initial Endocrine Consultative Visit: 04/30/2020    PATIENT IDENTIFIER: Mr. Zayaan Kozak. is a 73 y.o. male with a past medical history of T2DM, CAD, Dyslipidemia and HTN. The patient has followed with Endocrinology clinic since 04/30/2020 for consultative assistance with management of his diabetes.  DIABETIC HISTORY:  Mr. Wence was diagnosed with DM in 1998 , he has been on Janumet,  Glimepiride and Trulicity with reported N/V with Trulicity.Was put on insulin in 2012 following admission for NSTEMI . His hemoglobin A1c has ranged from 7.3% in 2013, peaking at 13.4% in 2019   Pt was established with Dr. Elvera Lennox from 2013 to 2014   On his initial visit to our clinic his A1c was 9.7 % . We continued Metformin and changed NPH insulin to insulin Mix    Was unable to get the dexcom 06/2021 Marcelline Deist started 06/2021   Switch insulin mix to basal insulin 10/2021  Started Ozempic 11/2022 but this was discontinued by the patient due to GI side effects   SUBJECTIVE:   During the last visit (11/25/2022): A1c 7.2% .     Today (09/24/2023): Mr. Loflin is here for a follow up on diabetes.He is accompanied by his wife today.  He has not used his freestyle libre as it keeps falling off. He uses finger sticks 2 x daily .   Patient follows with cardiology for CAD, CHF and HTN  He continues to follow-up with neurology (Dr. Arbutus Leas) for essential tremors, and gait instability  Denies nausea or vomiting  Denies diarrhea or constipation    HOME DIABETES REGIMEN:  Metformin 1000 mg twice daily Farxiga 10 mg daily Ozempic 0.5 mg weekly- not taking  Lantus 36 units daily      Statin: yes ACE-I/ARB: yes       DIABETIC COMPLICATIONS: Microvascular complications:  Neuropathy Denies: CKD, retinopathy Last Eye Exam:  Completed 04/22/2023  Macrovascular complications:  CAD (S/P PCI )  Denies: CVA, PVD   HISTORY:  Past Medical History:  Past Medical History:  Diagnosis Date   Anxiety    Arthritis    BCC (basal cell carcinoma of skin) 12/2020   CAD (coronary artery disease)    a. NSTEMI 12/12: EF 40-45%. WUX:[id number] balloon PTCA + Promus DES x 1 to mid LAD;  b. 11/2012: Cath with Cutting balloon PTCA  LAD for ISR to LAD, EF 55%;  c. 12/2012 Cath/PCI: LAD stents patent, 80 ost Diag (jailed)->PTCA, LCX/RCA patent. d. s/p NSTEMI in 11/2021 with DES to D1   Essential hypertension    GI bleed    History of blood transfusion    was getting 4 units   Hyperlipidemia    Ischemic cardiomyopathy    a.  echo 08/06/11: dist ant wall, apical and septal and infero-apical HK, mild LVH, EF 40%, mild LAE, PASP 34, asc aorta mildly dilated, mild TR;  b.  Echo (3/13) showed recovery of LV systolic function with EF 55-60%, grade I diastolic dysfunction, mild MR.    Myocardial infarction [facility])    twice with NSTEMI 2012   Palpitations    Type 2 diabetes mellitus (HCC)    Past Surgical History:  Past Surgical History:  Procedure Laterality Date   ANTERIOR CERVICAL DECOMP/DISCECTOMY FUSION  in the 90s  CHOLECYSTECTOMY  03/27/2019   COLONOSCOPY  2021   COLONOSCOPY WITH ESOPHAGOGASTRODUODENOSCOPY (EGD)  10/2019   CORONARY ANGIOPLASTY  12/15/2012; 12/22/2012   CORONARY ANGIOPLASTY WITH STENT PLACEMENT  07/2011   "1" (12/22/2012)   CORONARY STENT INTERVENTION N/A 12/08/2021   Procedure: CORONARY STENT INTERVENTION;  Surgeon: Orbie Pyo, MD;  Location: [facility] INVASIVE CV LAB;  Service: Cardiovascular;  Laterality: N/A;   GASTRIC BYPASS  12/21/2017   KNEE ARTHROSCOPY Left 12/31/2016   LEFT HEART CATH AND CORONARY ANGIOGRAPHY N/A 12/08/2021   Procedure: LEFT HEART CATH AND CORONARY ANGIOGRAPHY;  Surgeon: Orbie Pyo, MD;  Location: [facility] INVASIVE CV LAB;  Service: Cardiovascular;  Laterality: N/A;   LEFT HEART  CATHETERIZATION WITH CORONARY ANGIOGRAM N/A 08/06/2011   Procedure: LEFT HEART CATHETERIZATION WITH CORONARY ANGIOGRAM;  Surgeon: Kathleene Hazel, MD;  Location: [facility] CATH LAB;  Service: Cardiovascular;  Laterality: N/A;   LEFT HEART CATHETERIZATION WITH CORONARY ANGIOGRAM N/A 12/15/2012   Procedure: LEFT HEART CATHETERIZATION WITH CORONARY ANGIOGRAM;  Surgeon: Tonny Bollman, MD;  Location: [facility] CATH LAB;  Service: Cardiovascular;  Laterality: N/A;   LEFT HEART CATHETERIZATION WITH CORONARY ANGIOGRAM N/A 12/22/2012   Procedure: LEFT HEART CATHETERIZATION WITH CORONARY ANGIOGRAM;  Surgeon: Tonny Bollman, MD;  Location: [facility] CATH LAB;  Service: Cardiovascular;  Laterality: N/A;   PERCUTANEOUS CORONARY STENT INTERVENTION (PCI-S) Right 12/15/2012   Procedure: PERCUTANEOUS CORONARY STENT INTERVENTION (PCI-S);  Surgeon: Tonny Bollman, MD;  Location: [facility] CATH LAB;  Service: Cardiovascular;  Laterality: Right;   Social History:  reports that he quit smoking about 31 years ago. His smoking use included cigarettes. He started smoking about 56 years ago. He has a 25 pack-year smoking history. He has never been exposed to tobacco smoke. He has never used smokeless tobacco. He reports that he does not currently use alcohol. He reports that he does not use drugs. Family History:  Family History  Problem Relation Age of Onset   Diabetes Brother    Lung cancer Brother    Diabetes Mother    Coronary artery disease Father    Heart attack Father 20   Diabetes Sister    Coronary artery disease Sister    Colon cancer Neg Hx    Prostate cancer Neg Hx    Stroke Neg Hx    Esophageal cancer Neg Hx    Colon polyps Neg Hx    Rectal cancer Neg Hx    Stomach cancer Neg Hx      HOME MEDICATIONS: Allergies as of 09/24/2023       Reactions   Ozempic (0.25 Or 0.5 Mg-dose) [semaglutide(0.25 Or 0.5mg -dos)] Nausea Only   Trulicity [dulaglutide] Nausea And Vomiting        Medication List        Accurate as of  September 24, 2023 11:11 AM. If you have any questions, ask your nurse or doctor.          STOP taking these medications    FreeStyle Libre 3 Reader Hardie Pulley Stopped by: Johnney Ou Lacara Dunsworth   FreeStyle Libre 3 Sensor Misc Stopped by: Johnney Ou Keyah Blizard       TAKE these medications    Abrysvo 120 MCG/0.5ML injection Generic drug: RSV bivalent vaccine   aspirin EC 81 MG tablet Take 81 mg by mouth daily.   atorvastatin 80 MG tablet Commonly known as: LIPITOR Take 1 tablet (80 mg total) by mouth at bedtime.   carvedilol 6.25 MG tablet Commonly known as: COREG Take 1 tablet (6.25 mg total) by  mouth 2 (two) times daily with a meal.   cyanocobalamin 1000 MCG tablet Commonly known as: VITAMIN B12 Take 1,000 mcg by mouth daily.   dapagliflozin propanediol 10 MG Tabs tablet Commonly known as: FARXIGA Take 1 tablet (10 mg total) by mouth daily.   Entresto 24-26 MG Generic drug: sacubitril-valsartan Take 1 tablet by mouth 2 (two) times daily.   FLUoxetine 40 MG capsule Commonly known as: PROZAC Take 1 capsule (40 mg total) by mouth daily.   gabapentin 300 MG capsule Commonly known as: NEURONTIN Take 1 capsule (300 mg total) by mouth 2 (two) times daily.   Insulin Pen Needle 32G X 4 MM Misc 1 Device by Does not apply route daily in the afternoon.   Lantus SoloStar 100 UNIT/ML Solostar Pen Generic drug: insulin glargine Inject 36 Units into the skin daily.   magnesium oxide 400 MG tablet Commonly known as: MAG-OX Take 1 tablet (400 mg total) by mouth 2 (two) times daily.   metFORMIN 1000 MG tablet Commonly known as: GLUCOPHAGE Take 1 tablet (1,000 mg total) by mouth 2 (two) times daily with a meal.   multivitamin with minerals tablet Take 1 tablet by mouth daily.   nitroGLYCERIN 0.4 MG SL tablet Commonly known as: NITROSTAT Place 1 tablet (0.4 mg total) under the tongue every 5 (five) minutes x 3 doses as needed for chest pain (if no relief after 3rd dose,  proceed to ED or call 911).   ONE TOUCH ULTRA 2 w/Device Kit by Does not apply route.   OneTouch Ultra test strip Generic drug: glucose blood Check blood sugar 2-3 times daily.  Dx Code: E11.9   pantoprazole 40 MG tablet Commonly known as: PROTONIX Take 1 tablet (40 mg total) by mouth 2 (two) times daily before a meal.   Potassium 99 MG Tabs Take 99 mg by mouth daily.   primidone 50 MG tablet Commonly known as: MYSOLINE TAKE 4 TABLETS BY MOUTH EVERY MORNING and TAKE 3 TABLETS AT BEDTIME   spironolactone 25 MG tablet Commonly known as: ALDACTONE Take 0.5 tablets (12.5 mg total) by mouth daily. Patient is only taking half due to pharmacy error   vitamin D3 50 MCG (2000 UT) Caps Take 2,000 Units by mouth daily.         OBJECTIVE:   Vital Signs: BP 130/80 (BP Location: Right Arm, Patient Position: Sitting, Cuff Size: Normal)   Pulse 60   Ht 6\' 2"  (1.88 m)   Wt 230 lb (104.3 kg)   SpO2 96%   BMI 29.53 kg/m   Wt Readings from Last 3 Encounters:  09/24/23 230 lb (104.3 kg)  09/06/23 228 lb 9.6 oz (103.7 kg)  06/04/23 225 lb 6.4 oz (102.2 kg)     Exam: General: Pt appears well and is in NAD  Lungs: Clear with good BS bilat with no rales, rhonchi, or wheezes  Heart: RRR   Extremities: No pretibial edema   Neuro: MS is good with appropriate affect, pt is alert and Ox3    DM foot exam: 09/24/2023   The skin of the feet is without sores or ulcerations, 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 The sensation is decreased to a screening 5.07, 10 gram monofilament bilaterally  DATA REVIEWED:  Lab Results  Component Value Date   HGBA1C 7.9 (A) 09/24/2023   HGBA1C 7.2 (H) 09/30/2022   HGBA1C 8.3 (A) 05/27/2022  Latest Reference Range & Units 01/29/23 10:29  Sodium 135 - 145 mEq/L 141  Potassium 3.5 - 5.1 mEq/L 4.3  Chloride 96 - 112 mEq/L 104  CO2 19 - 32 mEq/L 29  Glucose 70 - 99 mg/dL 161 (H)   BUN 6 - 23 mg/dL 16  Creatinine 0.96 - 0.45 mg/dL 4.09  Calcium 8.4 - 81.1 mg/dL 8.9  AST 0 - 37 U/L 25  ALT 0 - 53 U/L 32  GFR >60.00 mL/min 87.47    Latest Reference Range & Units 01/29/23 10:29  Total CHOL/HDL Ratio  3  Cholesterol 0 - 200 mg/dL 914  HDL Cholesterol >78.29 mg/dL 56.21  Direct LDL mg/dL 30.8  NonHDL  65.78  Triglycerides 0.0 - 149.0 mg/dL 469.6 (H)  VLDL 0.0 - 29.5 mg/dL 28.4 (H)  (H): Data is abnormally high  ASSESSMENT / PLAN / RECOMMENDATIONS:   1) Type 2 Diabetes Mellitus, Sub-Optimally controlled, With neuropathic, and macrovascular  complications and microalbuminuria  - Most recent A1c of 7.9 %. Goal A1c < 7.0 %.    -His A1c is trending up  due to dietary indiscretions  -Patient has noted improvement in glycemic control since he stopped candy/sweet -We opted not to make any changes and we will recheck on the next visit -Patient encouraged to continue to avoid sugar sweetened beverages, candy, and follow a low-carb diet -He has reported intolerance with Trulicity and Ozempic  with nausea and vomiting,     MEDICATIONS: -Continue Lantus 36 units daily  - Continue Metformin 1000 mg, 1 tablet with Breakfast and Supper - Continue  Farxiga 10 mg daily   EDUCATION / INSTRUCTIONS: BG monitoring instructions: Patient is instructed to check his blood sugars 2 times a day, before breakfast and Supper . Call [facility] if: BG persistently < 70 I reviewed the Rule of 15 for the treatment of hypoglycemia in detail with the patient. Literature supplied.     2) Diabetic complications:  Eye: Does not have known diabetic retinopathy.  Neuro/ Feet: Does  have known diabetic peripheral neuropathy  Renal: Patient does not have known baseline CKD, but has microalbuminuria. He is  on an ACEI/ARB at present.     F/U in 4 months    Signed electronically by: Lyndle Herrlich, MD  [facility]  [facility] [address] [place] [place], [place] [address] Phone: [phone] FAX: [phone]   CC: [name], MD [address] [place] [address] Phone: [phone]  Fax: [phone]  Return to Endocrinology clinic as below: Future Appointments  Date Time Provider Department Center  10/04/2023 11:20 AM [name], MD [facility]  01/19/2024  8:30 AM [facility] PHARMACIST [facility]  06/15/2024  9:00 AM [facility] CLINICAL SUPPORT [facility]

## 2023-10-04 ENCOUNTER — Encounter: Payer: Self-pay | Admitting: Internal Medicine

## 2023-10-04 ENCOUNTER — Ambulatory Visit: Payer: PPO | Admitting: Internal Medicine

## 2023-10-04 ENCOUNTER — Ambulatory Visit (HOSPITAL_BASED_OUTPATIENT_CLINIC_OR_DEPARTMENT_OTHER)
Admission: RE | Admit: 2023-10-04 | Discharge: 2023-10-04 | Disposition: A | Discharge status: Home / Self Care | Payer: PPO | Source: Ambulatory Visit | Attending: Internal Medicine | Admitting: Internal Medicine

## 2023-10-04 VITALS — BP 138/60 | HR 69 | Temp 98.1°F | Resp 18 | Ht 74.0 in | Wt 228.5 lb

## 2023-10-04 DIAGNOSIS — Z794 Long term (current) use of insulin: Secondary | ICD-10-CM

## 2023-10-04 DIAGNOSIS — E1165 Type 2 diabetes mellitus with hyperglycemia: Secondary | ICD-10-CM

## 2023-10-04 DIAGNOSIS — M79644 Pain in right finger(s): Secondary | ICD-10-CM

## 2023-10-04 DIAGNOSIS — I10 Essential (primary) hypertension: Secondary | ICD-10-CM

## 2023-10-04 DIAGNOSIS — I2510 Atherosclerotic heart disease of native coronary artery without angina pectoris: Secondary | ICD-10-CM

## 2023-10-04 DIAGNOSIS — M19041 Primary osteoarthritis, right hand: Secondary | ICD-10-CM | POA: Diagnosis not present

## 2023-10-04 LAB — CBC WITH DIFFERENTIAL/PLATELET
Basophils Absolute: 0.1 10*3/uL (ref 0.0–0.1)
Basophils Relative: 0.9 % (ref 0.0–3.0)
Eosinophils Absolute: 0.4 10*3/uL (ref 0.0–0.7)
Eosinophils Relative: 5.5 % — ABNORMAL HIGH (ref 0.0–5.0)
HCT: 37.3 % — ABNORMAL LOW (ref 39.0–52.0)
Hemoglobin: 12.1 g/dL — ABNORMAL LOW (ref 13.0–17.0)
Lymphocytes Relative: 19.7 % (ref 12.0–46.0)
Lymphs Abs: 1.4 10*3/uL (ref 0.7–4.0)
MCHC: 32.5 g/dL (ref 30.0–36.0)
MCV: 84.2 fL (ref 78.0–100.0)
Monocytes Absolute: 0.7 10*3/uL (ref 0.1–1.0)
Monocytes Relative: 9.4 % (ref 3.0–12.0)
Neutro Abs: 4.7 10*3/uL (ref 1.4–7.7)
Neutrophils Relative %: 64.5 % (ref 43.0–77.0)
Platelets: 285 10*3/uL (ref 150.0–400.0)
RBC: 4.43 Mil/uL (ref 4.22–5.81)
RDW: 16.5 % — ABNORMAL HIGH (ref 11.5–15.5)
WBC: 7.3 10*3/uL (ref 4.0–10.5)

## 2023-10-04 LAB — BASIC METABOLIC PANEL
BUN: 11 mg/dL (ref 6–23)
CO2: 28 meq/L (ref 19–32)
Calcium: 8.8 mg/dL (ref 8.4–10.5)
Chloride: 104 meq/L (ref 96–112)
Creatinine, Ser: 0.66 mg/dL (ref 0.40–1.50)
GFR: 93.97 mL/min (ref >60.00)
Glucose, Bld: 122 mg/dL — ABNORMAL HIGH (ref 70–99)
Potassium: 4.4 meq/L (ref 3.5–5.1)
Sodium: 141 meq/L (ref 135–145)

## 2023-10-04 NOTE — Assessment & Plan Note (Signed)
 DM: Per Endo.  Check micro. Proteinuria: Next visit with renal around 12/2023. HTN: BP looks very good, continue carvedilol , Entresto , Aldactone ,.  Monitor BPs at home. CAD, heart failure: Saw cardiology 09/06/2023, felt to be stable.  Last EF 40 to 45%.  Check BMP and CBC Vitamin D  deficiency: Noted October 2024, reports she got a ergocalciferol .  Feeling better.  Recommend to take vitamin D  OTC 2000 units daily Tremors, gait instability, dizziness. 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 Lumbar MRI: Findings consistent with DJD, see report. Fall prevention discussed. Right thumb injury: See history and physical, hesitant to see Ortho, we agreed to do x-ray, call if not gradually better. Vax advise: Tdap, RSV. RTC 4 to 5 months

## 2023-10-04 NOTE — Patient Instructions (Addendum)
 Vaccines I recommend: Tdap (tetanus) RSV  Check the  blood pressure regularly Blood pressure goal:  between 110/65 and  135/85. If it is consistently higher or lower, let me know    GO TO THE LAB : Get the blood work     Next visit with me in 4 to 5 months     Please schedule it at the front desk  To the first floor, get the x-ray.    Fall Prevention in the Home, Adult Falls can cause injuries and affect people of all ages. There are many simple things that you can do to make your home safe and to help prevent falls. If you need it, ask for help making these changes. What actions can I take to prevent falls? General information Use good lighting in all rooms. Make sure to: Replace any light bulbs that burn out. Turn on lights if it is dark and use night-lights. Keep items that you use often in easy-to-reach places. Lower the shelves around your home if needed. Move furniture so that there are clear paths around it. Do not keep throw rugs or other things on the floor that can make you trip. If any of your floors are uneven, fix them. Add color or contrast paint or tape to clearly mark and help you see: Grab bars or handrails. First and last steps of staircases. Where the edge of each step is. If you use a ladder or stepladder: Make sure that it is fully opened. Do not climb a closed ladder. Make sure the sides of the ladder are locked in place. Have someone hold the ladder while you use it. Know where your pets are as you move through your home. What can I do in the bathroom?     Keep the floor dry. Clean up any water that is on the floor right away. Remove soap buildup in the bathtub or shower. Buildup makes bathtubs and showers slippery. Use non-skid mats or decals on the floor of the bathtub or shower. Attach bath mats securely with double-sided, non-slip rug tape. If you need to sit down while you are in the shower, use a non-slip stool. Install grab bars by the  toilet and in the bathtub and shower. Do not use towel bars as grab bars. What can I do in the bedroom? Make sure that you have a light by your bed that is easy to reach. Do not use any sheets or blankets on your bed that hang to the floor. Have a firm bench or chair with side arms that you can use for support when you get dressed. What can I do in the kitchen? Clean up any spills right away. If you need to reach something above you, use a sturdy step stool that has a grab bar. Keep electrical cables out of the way. Do not use floor polish or wax that makes floors slippery. What can I do with my stairs? Do not leave anything on the stairs. Make sure that you have a light switch at the top and the bottom of the stairs. Have them installed if you do not have them. Make sure that there are handrails on both sides of the stairs. Fix handrails that are broken or loose. Make sure that handrails are as long as the staircases. Install non-slip stair treads on all stairs in your home if they do not have carpet. Avoid having throw rugs at the top or bottom of stairs, or secure the rugs with carpet  tape to prevent them from moving. Choose a carpet design that does not hide the edge of steps on the stairs. Make sure that carpet is firmly attached to the stairs. Fix any carpet that is loose or worn. What can I do on the outside of my home? Use bright outdoor lighting. Repair the edges of walkways and driveways and fix any cracks. Clear paths of anything that can make you trip, such as tools or rocks. Add color or contrast paint or tape to clearly mark and help you see high doorway thresholds. Trim any bushes or trees on the main path into your home. Check that handrails are securely fastened and in good repair. Both sides of all steps should have handrails. Install guardrails along the edges of any raised decks or porches. Have leaves, snow, and ice cleared regularly. Use sand, salt, or ice melt on  walkways during winter months if you live where there is ice and snow. In the garage, clean up any spills right away, including grease or oil spills. What other actions can I take? Review your medicines with your health care provider. Some medicines can make you confused or feel dizzy. This can increase your chance of falling. Wear closed-toe shoes that fit well and support your feet. Wear shoes that have rubber soles and low heels. Use a cane, walker, scooter, or crutches that help you move around if needed. Talk with your provider about other ways that you can decrease your risk of falls. This may include seeing a physical therapist to learn to do exercises to improve movement and strength. Where to find more information Centers for Disease Control and Prevention, [employer]: TonerPromos.no [employer] on Aging: BaseRingTones.pl National Institute on Aging: BaseRingTones.pl Contact a health care provider if: You are afraid of falling at home. You feel weak, drowsy, or dizzy at home. You fall at home. Get help right away if you: Lose consciousness or have trouble moving after a fall. Have a fall that causes a head injury. These symptoms may be an emergency. Get help right away. Call 911. Do not wait to see if the symptoms will go away. Do not drive yourself to the hospital. This information is not intended to replace advice given to you by your health care provider. Make sure you discuss any questions you have with your health care provider. Document Revised: 04/13/2022 Document Reviewed: 04/13/2022 Elsevier Patient Education  2024 [employer].

## 2023-10-04 NOTE — Progress Notes (Signed)
 Subjective:    Patient ID: Randy Castro., male    DOB: 1951-07-26, 73 y.o.   MRN: [id number]  DOS:  10/04/2023 Type of visit - description: Routine follow-up  Routine follow-up. Chart reviewed. In general feels well, was recently feeling fatigued but is much better. A week ago after he did some lifting on his workshop he developed a "popP of the right thumb.  Does not remember any injury.  Review of Systems See above   Past Medical History:  Diagnosis Date   Anxiety    Arthritis    BCC (basal cell carcinoma of skin) 12/2020   CAD (coronary artery disease)    a. NSTEMI 12/12: EF 40-45%. [id number] balloon PTCA + Promus DES x 1 to mid LAD;  b. 11/2012: Cath with Cutting balloon PTCA  LAD for ISR to LAD, EF 55%;  c. 12/2012 Cath/PCI: LAD stents patent, 80 ost Diag (jailed)->PTCA, LCX/RCA patent. d. s/p NSTEMI in 11/2021 with DES to D1   Essential hypertension    GI bleed    History of blood transfusion    was getting 4 units   Hyperlipidemia    Ischemic cardiomyopathy    a.  echo 08/06/11: dist ant wall, apical and septal and infero-apical HK, mild LVH, EF 40%, mild LAE, PASP 34, asc aorta mildly dilated, mild TR;  b.  Echo (3/13) showed recovery of LV systolic function with EF 55-60%, grade I diastolic dysfunction, mild MR.    Myocardial infarction [facility])    twice with NSTEMI 2012   Palpitations    Type 2 diabetes mellitus [facility])     Past Surgical History:  Procedure Laterality Date   ANTERIOR CERVICAL DECOMP/DISCECTOMY FUSION  in the 90s   CHOLECYSTECTOMY  03/27/2019   COLONOSCOPY  2021   COLONOSCOPY WITH ESOPHAGOGASTRODUODENOSCOPY (EGD)  10/2019   CORONARY ANGIOPLASTY  12/15/2012; 12/22/2012   CORONARY ANGIOPLASTY WITH STENT PLACEMENT  07/2011   "1" (12/22/2012)   CORONARY STENT INTERVENTION N/A 12/08/2021   Procedure: CORONARY STENT INTERVENTION;  Surgeon: Kyra Phy, MD;  [facility]: [facility] INV[facility] LAB;  Service: Cardiovascular;  Laterality: N/A;   GASTRIC BYPASS   12/21/2017   KNEE ARTHROSCOPY Left 12/31/2016   LEFT HEART CATH AND CORONARY ANGIOGRAPHY N/A 12/08/2021   Procedure: LEFT HEART CATH AND CORONARY ANGIOGRAPHY;  Surgeon: Kyra Phy, MD;  Location: [facility];  Service: Cardiovascular;  Laterality: N/A;   LEFT HEART CATHETERIZATION WITH CORONARY ANGIOGRAM N/A 08/06/2011   Procedure: LEFT HEART CATHETERIZATION WITH CORONARY ANGIOGRAM;  Surgeon: Odie Benne, MD;  Location: [facility] CATH LAB;  Service: Cardiovascular;  Laterality: N/A;   LEFT HEART CATHETERIZATION WITH CORONARY ANGIOGRAM N/A 12/15/2012   Procedure: LEFT HEART CATHETERIZATION WITH CORONARY ANGIOGRAM;  Surgeon: Arnoldo Lapping, MD;  Location: [facility] CATH LAB;  Service: Cardiovascular;  Laterality: N/A;   LEFT HEART CATHETERIZATION WITH CORONARY ANGIOGRAM N/A 12/22/2012   Procedure: LEFT HEART CATHETERIZATION WITH CORONARY ANGIOGRAM;  Surgeon: Arnoldo Lapping, MD;  Location: [facility] CATH LAB;  Service: Cardiovascular;  Laterality: N/A;   PERCUTANEOUS CORONARY STENT INTERVENTION (PCI-S) Right 12/15/2012   Procedure: PERCUTANEOUS CORONARY STENT INTERVENTION (PCI-S);  Surgeon: Arnoldo Lapping, MD;  Location: [facility] CATH LAB;  Service: Cardiovascular;  Laterality: Right;    Current Outpatient Medications  Medication Instructions   aspirin  EC 81 mg, Daily   atorvastatin  (LIPITOR ) 80 mg, Oral, Daily at bedtime   Blood Glucose Monitoring Suppl (ONE TOUCH ULTRA 2) w/Device KIT by Does not apply route.   carvedilol  (  COREG ) 6.25 mg, Oral, 2 times daily with meals   cyanocobalamin  (VITAMIN B12) 1,000 mcg, Daily   dapagliflozin  propanediol (FARXIGA ) 10 mg, Oral, Daily   FLUoxetine  (PROZAC ) 40 mg, Oral, Daily   gabapentin  (NEURONTIN ) 300 mg, Oral, 2 times daily   glucose blood (ONETOUCH ULTRA) test strip Check blood sugar 2-3 times daily.  Dx Code: E11.9   Insulin  Pen Needle 32G X 4 MM MISC 1 Device, Does not apply, Daily   Lantus  SoloStar 36 Units, Subcutaneous, Daily   magnesium  oxide (MAG-OX)  400 mg, Oral, 2 times daily   metFORMIN  (GLUCOPHAGE ) 1,000 mg, Oral, 2 times daily with meals   Multiple Vitamins-Minerals (MULTIVITAMIN WITH MINERALS) tablet 1 tablet, Daily   nitroGLYCERIN  (NITROSTAT ) 0.4 mg, Sublingual, Every 5 min x3 PRN   pantoprazole  (PROTONIX ) 40 mg, Oral, 2 times daily before meals   Potassium 99 mg, Daily   primidone  (MYSOLINE ) 50 MG tablet TAKE 4 TABLETS BY MOUTH EVERY MORNING and TAKE 3 TABLETS AT BEDTIME   sacubitril -valsartan  (ENTRESTO ) 24-26 MG 1 tablet, Oral, 2 times daily   spironolactone  (ALDACTONE ) 12.5 mg, Oral, Daily, Patient is only taking half due to pharmacy error   vitamin D3 2,000 Units, Daily       Objective:   Physical Exam BP 138/60   Pulse 69   Temp 98.1 F (36.7 C) (Oral)   Resp 18   Ht 6\' 2"  (1.88 m)   Wt 228 lb 8 oz (103.6 kg)   SpO2 94%   BMI 29.34 kg/m  General:   Well developed, NAD, BMI noted. HEENT:  Normocephalic . Face symmetric, atraumatic Lungs:  CTA B Normal respiratory effort, no intercostal retractions, no accessory muscle use. Heart: RRR,  no murmur. Upper extremities: Left hand normal other than DJD changes Right hand: Degenerative changes, thumb PIP slightly TTP, minimal swelling?  It does lock/unlock  with flexion extension Lower extremities: no pretibial edema bilaterally  Skin: Not pale. Not jaundice Neurologic:  alert & oriented X3.  Speech normal, gait appropriate for age and unassisted Psych--  Cognition and judgment appear intact.  Cooperative with normal attention span and concentration.  Behavior appropriate. No anxious or depressed appearing.      Assessment     Assessment   DM : insulin  dependent,  w/ CAD CHF. Intol Trulicity , per endo Neuropathy, severe, documented by NCS 2022 HTN Hyperlipidemia CV: ---CAD  NSTEMI in 07/2011; unstable angina in 11/2012 and 12/2012. NSTMI 11-2021 .  ---CHF, ischemic cardiomyopathy, PVCs (Holter 2019 show 13.6% burden) OSA dx ~08-2017, not on CPAP as off  05-2023, essentially asymptomatic since he lost weight after bariatric surgery Anxiety-depression Essential Tremor dx 2019  Skin cancer Morbid obesity:gastric sleeve, 11-2017 Dr Gordon Latus B12 deficiency Dx 2022 Vitamin D  deficiency Dx 05-2022 09-2019 GI bleed, gastric ulcer, required transfusion  PLAN  DM: Per Endo.  Check micro. Proteinuria: Next visit with renal around 12/2023. HTN: BP looks very good, continue carvedilol , Entresto , Aldactone ,.  Monitor BPs at home. CAD, heart failure: Saw cardiology 09/06/2023, felt to be stable.  Last EF 40 to 45%.  Check BMP and CBC Vitamin D  deficiency: Noted October 2024, reports she got a ergocalciferol .  Feeling better.  Recommend to take vitamin D  OTC 2000 units daily Tremors, gait instability, dizziness. 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 Lumbar MRI: Findings consistent with DJD, see report. Fall prevention discussed. Right thumb injury: See history and physical, hesitant to see Ortho, we agreed to do x-ray,  call if not gradually better. Vax advise: Tdap, RSV. RTC 4 to 5 months

## 2023-10-05 LAB — MICROALBUMIN / CREATININE URINE RATIO
Creatinine,U: 54.4 mg/dL
Microalb Creat Ratio: 1157.9 mg/g — ABNORMAL HIGH (ref 0.0–30.0)
Microalb, Ur: 63 mg/dL — ABNORMAL HIGH (ref 0.0–1.9)

## 2023-10-08 ENCOUNTER — Encounter: Payer: Self-pay | Admitting: Internal Medicine

## 2023-10-09 ENCOUNTER — Encounter: Payer: Self-pay | Admitting: Internal Medicine

## 2023-10-09 DIAGNOSIS — M79644 Pain in right finger(s): Secondary | ICD-10-CM

## 2023-10-11 NOTE — Addendum Note (Signed)
Addended byConrad Satsop D on: 10/11/2023 01:11 PM   Modules accepted: Orders

## 2023-10-11 NOTE — Telephone Encounter (Signed)
Refer to Ortho, Dx right thumb injury.  X-ray was done but not read

## 2023-10-11 NOTE — Telephone Encounter (Signed)
 Referral placed.

## 2023-10-12 ENCOUNTER — Encounter: Payer: Self-pay | Admitting: Internal Medicine

## 2023-10-12 NOTE — Telephone Encounter (Signed)
LMOM for radiology reading room to get reading on R thumb x-ray.

## 2023-10-27 ENCOUNTER — Ambulatory Visit (INDEPENDENT_AMBULATORY_CARE_PROVIDER_SITE_OTHER): Payer: PPO | Admitting: Orthopaedic Surgery

## 2023-10-27 ENCOUNTER — Other Ambulatory Visit: Payer: Self-pay | Admitting: Nurse Practitioner

## 2023-10-27 ENCOUNTER — Other Ambulatory Visit: Payer: Self-pay | Admitting: Neurology

## 2023-10-27 ENCOUNTER — Other Ambulatory Visit: Payer: Self-pay | Admitting: Internal Medicine

## 2023-10-27 DIAGNOSIS — G250 Essential tremor: Secondary | ICD-10-CM

## 2023-10-27 DIAGNOSIS — M65311 Trigger thumb, right thumb: Secondary | ICD-10-CM | POA: Diagnosis not present

## 2023-10-27 MED ORDER — LIDOCAINE HCL 1 % IJ SOLN
0.5000 mL | INTRAMUSCULAR | Status: AC | PRN
  Administered 2023-10-27: .5 mL

## 2023-10-27 MED ORDER — METHYLPREDNISOLONE ACETATE 40 MG/ML IJ SUSP
20.0000 mg | INTRAMUSCULAR | Status: AC | PRN
  Administered 2023-10-27: 20 mg

## 2023-10-27 MED ORDER — BUPIVACAINE HCL 0.25 % IJ SOLN
0.5000 mL | INTRAMUSCULAR | Status: AC | PRN
  Administered 2023-10-27: .5 mL

## 2023-10-27 NOTE — Telephone Encounter (Signed)
 Patient is sch for 11-05-23 with Dr Arbutus Leas

## 2023-10-27 NOTE — Progress Notes (Signed)
 Office Visit Note   Patient: Randy Castro.           Date of Birth: 1951-05-19           MRN: [id number] Visit Date: 10/27/2023              Requested by: [name], MD [address] [place],  [place] [address] PCP: [name], MD   Assessment & Plan: Visit Diagnoses:  1. Trigger thumb, right thumb     Plan: Trigger thumb injection performed.  We discussed operative trigger thumb if he have persistent symptoms.  He tolerated the injection well.  He will use his splint dorsally over the IP joint if he has persistent pain early this week.  With Dr.Agarwala  Follow-Up Instructions: No follow-ups on file.   Orders:  No orders of the defined types were placed in this encounter.  No orders of the defined types were placed in this encounter.     Procedures: Hand/UE Inj: R thumb A1 for trigger finger on 10/27/2023 11:15 AM Medications: 0.5 mL lidocaine 1 %; 0.5 mL bupivacaine 0.25 %; 20 mg methylPREDNISolone acetate 40 MG/ML      Clinical Data: No additional findings.   Subjective: No chief complaint on file.   HPI 73 year old male type 2 diabetes long-term use of insulin with some balance problems uses a cane.  History of STEMI.  He said pain in his right thumb difficulty bending grabbing squeezing with sharp pain and notes clicking.  Review of Systems all other systems noncontributory to HPI.   Objective: Vital Signs: There were no vitals taken for this visit.  Physical Exam Constitutional:      Appearance: He is well-developed.  HENT:     Head: Normocephalic and atraumatic.     Right Ear: External ear normal.     Left Ear: External ear normal.  Eyes:     Pupils: Pupils are equal, round, and reactive to light.  Neck:     Thyroid: No thyromegaly.     Trachea: No tracheal deviation.  Cardiovascular:     Rate and Rhythm: Normal rate.  Pulmonary:     Effort: Pulmonary effort is normal.     Breath sounds: No wheezing.  Abdominal:      General: Bowel sounds are normal.     Palpations: Abdomen is soft.  Musculoskeletal:     Cervical back: Neck supple.  Skin:    General: Skin is warm and dry.     Capillary Refill: Capillary refill takes less than 2 seconds.  Neurological:     Mental Status: He is alert and oriented to person, place, and time.  Psychiatric:        Behavior: Behavior normal.        Thought Content: Thought content normal.        Judgment: Judgment normal.     Ortho Exam tenderness A1 pulleys demonstrating active clicking and popping but no locking that requires manual reduction.  Tenderness over the A1 pulley right thumb.  Opposite left hand is normal.  Specialty Comments:  No specialty comments available.  Imaging: No results found.   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   Abdominal pain 11/17/2018   Anemia 11/14/2017   OSA (obstructive sleep apnea) 09/21/2017   Obesity 01/23/2016   PCP NOTES >>> 06/08/2015   Anxiety and depression 05/31/2013   Intermediate coronary syndrome (HCC) 12/22/2012   Unstable angina (HCC) 12/16/2012   Fatigue 04/26/2012   Annual physical exam 10/28/2011   Rash 10/28/2011   Coronary Artery Disease 08/28/2011   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   Palpitations 08/28/2011   NSTEMI (non-ST elevated myocardial infarction) (HCC) 08/07/2011   DJD (degenerative joint disease) 05/28/2011   Poorly controlled type 2 diabetes  mellitus with circulatory disorder (HCC)    Hyperlipidemia    HTN (hypertension)    Past Medical History:  Diagnosis Date   Anxiety    Arthritis    BCC (basal cell carcinoma of skin) 12/2020   CAD (coronary artery disease)    a. NSTEMI 12/12: EF 40-45%. [id number] balloon PTCA + Promus DES x 1 to mid LAD;  b. 11/2012: Cath with Cutting balloon PTCA  LAD for ISR to LAD, EF 55%;  c. 12/2012 Cath/PCI: LAD stents patent, 80 ost Diag (jailed)->PTCA, LCX/RCA patent. d. s/p NSTEMI in 11/2021 with DES to D1   Essential hypertension    GI bleed    History of blood transfusion    was getting 4 units   Hyperlipidemia    Ischemic cardiomyopathy    a.  echo 08/06/11: dist ant wall, apical and septal and infero-apical HK, mild LVH, EF 40%, mild LAE, PASP 34, asc aorta mildly dilated, mild TR;  b.  Echo (3/13) showed recovery of LV systolic function with EF 55-60%, grade I diastolic dysfunction, mild MR.    Myocardial infarction [facility])    twice with NSTEMI 2012   Palpitations    Type 2 diabetes mellitus (HCC)     Family History  Problem Relation Age of Onset   Diabetes Brother    Lung cancer Brother    Diabetes Mother    Coronary artery disease Father    Heart attack Father 70   Diabetes Sister    Coronary artery disease Sister    Colon cancer Neg Hx    Prostate cancer Neg Hx    Stroke Neg Hx    Esophageal cancer Neg Hx    Colon polyps Neg Hx    Rectal cancer Neg Hx    Stomach cancer Neg Hx     Past Surgical History:  Procedure Laterality Date   ANTERIOR CERVICAL DECOMP/DISCECTOMY FUSION  in the 90s   CHOLECYSTECTOMY  03/27/2019   COLONOSCOPY  2021   COLONOSCOPY WITH ESOPHAGOGASTRODUODENOSCOPY (EGD)  10/2019   CORONARY ANGIOPLASTY  12/15/2012; 12/22/2012   CORONARY ANGIOPLASTY WITH STENT PLACEMENT  07/2011   "1" (12/22/2012)   CORONARY STENT INTERVENTION N/A 12/08/2021   Procedure: CORONARY STENT INTERVENTION;  Surgeon: Orbie Pyo, MD;  Location: [facility] INVAS[facility] CV LAB;  Service:  Cardiovascular;  Laterality: N/A;   GASTRIC BYPASS  12/21/2017   KNEE ARTHROSCOPY Left 12/31/2016   LEFT HEART CATH AND CORONARY ANGIOGRAPHY N/A 12/08/2021   Procedure: LEFT HEART CATH AND CORONARY ANGIOGRAPHY;  Surgeon: Orbie Pyo, MD;  Location: [facility];  Service: Cardiovascular;  Laterality: N/A;   LEFT HEART CATHETERIZATION WITH CORONARY ANGIOGRAM N/A 08/06/2011   Procedure: LEFT HEART  CATHETERIZATION WITH CORONARY ANGIOGRAM;  Surgeon: Kathleene Hazel, MD;  Location: [facility] CATH LAB;  Service: Cardiovascular;  Laterality: N/A;   LEFT HEART CATHETERIZATION WITH CORONARY ANGIOGRAM N/A 12/15/2012   Procedure: LEFT HEART CATHETERIZATION WITH CORONARY ANGIOGRAM;  Surgeon: Tonny Bollman, MD;  Location: [facility] CATH LAB;  Service: Cardiovascular;  Laterality: N/A;   LEFT HEART CATHETERIZATION WITH CORONARY ANGIOGRAM N/A 12/22/2012   Procedure: LEFT HEART CATHETERIZATION WITH CORONARY ANGIOGRAM;  Surgeon: Tonny Bollman, MD;  Location: [facility] CATH LAB;  Service: Cardiovascular;  Laterality: N/A;   PERCUTANEOUS CORONARY STENT INTERVENTION (PCI-S) Right 12/15/2012   Procedure: PERCUTANEOUS CORONARY STENT INTERVENTION (PCI-S);  Surgeon: Tonny Bollman, MD;  Location: [facility] CATH LAB;  Service: Cardiovascular;  Laterality: Right;   Social History   Occupational History   Occupation: Environmental consultant  Tobacco Use   Smoking status: Former    Current packs/day: 0.00    Average packs/day: 1 pack/day for 25.0 years (25.0 ttl pk-yrs)    Types: Cigarettes    Start date: 04/13/1967    Quit date: 04/12/1992    Years since quitting: 31.5    Passive exposure: Never   Smokeless tobacco: Never   Tobacco comments:    Quit tobacco > 30 years ago  Vaping Use   Vaping status: Never Used  Substance and Sexual Activity   Alcohol use: Not Currently    Comment: 12/22/2012 "rarely maybe every 3-4 months, beer"   Drug use: No   Sexual activity: Yes

## 2023-11-04 NOTE — Progress Notes (Unsigned)
 Virtual Visit Via Video       Consent was obtained for video visit:  Yes.   Answered questions that patient had about telehealth interaction:  Yes.   I discussed the limitations, risks, security and privacy concerns of performing an evaluation and management service by telemedicine. I also discussed with the patient that there may be a patient responsible charge related to this service. The patient expressed understanding and agreed to proceed.  Pt location: Home in [place] Physician Location: office Name of referring provider:  [name], MD I connected with Randy Haws. at patients initiation/request on 11/05/2023 at 11:15 AM EDT by video enabled telemedicine application and verified that I am speaking with the correct person using two identifiers. Pt MRN:  [id number] Pt DOB:  [birth year] Video Participants:  Randy Haws.;    Assessment/Plan:    1.  Essential Tremor  Continue primidone, 50 mg, 4 tablets in the morning, 3 tablets in the evening.  Most days he feels like he does "pretty good" and isn't ready to increase the dosage.   2.  Gait instability, suspect in part (or in whole) due to peripheral neuropathy, likely diabetic   -EMG in April, 2022 demonstrated confirmation of axonal peripheral neuropathy.  It was fairly severe at that time, but clinically his significantly worsened, especially his gait.  He has become much more ataxic.  I have not found any other reasons for ataxia.  He had repeat EMG in September, 2024 demonstrating progression (mild) of that peripheral neuropathy, which was severe.  -SCA panel through [facility] was negative.  -Discussed again today that I really think he needs an ambulatory assistive device.  We have discussed this in the past several visits.  We discussed trekking poles.  We have discussed walker.    3.  B12 deficiency  -On oral supplementation  4. 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  This would not be the cause  of dizziness but pt referred to neurosx and decided that sx too risky  -eeg was normal.  -he was mildly orthostatic last visit and c/o sx's when we did orthostatic vitals  - he is going to talk to his cardiologist about the symptoms.  He follows with Dr. [name].   5.  Tinnitus  -likely due to [facility] Subjective:   Randy Haws. was seen today in follow up.   Patient did have an MRA of the head and neck last visit.  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  Getting EEG done since last visit and that was normal.  He was referred to Dr. Conchita Paris who recommended just following the patient.    he had a repeat EMG done since last visit and that demonstrated mild progression of his already severe chronic sensorimotor axonal peripheral neuropathy.  He is starting to use a cane more - "I should probably use it all of the time.  In terms of tremor, he remains on the primidone and he has "good days and bad days" - yesterday  was "bad."  Hard to say what makes a bad day.    Current prescribed movement disorder medications: Primidone, 50 mg, 4 in the morning, 3 tablets in the evening   ALLERGIES:   Allergies  Allergen Reactions   Ozempic (0.25 Or 0.5 Mg-Dose) [Semaglutide(0.25 Or 0.5mg -Dos)] Nausea Only   Trulicity [Dulaglutide] Nausea And Vomiting    CURRENT MEDICATIONS:  Outpatient Encounter Medications as of  11/05/2023  Medication Sig   aspirin EC 81 MG tablet Take 81 mg by mouth daily.   atorvastatin (LIPITOR) 80 MG tablet Take 1 tablet (80 mg total) by mouth at bedtime.   Blood Glucose Monitoring Suppl (ONE TOUCH ULTRA 2) w/Device KIT by Does not apply route.   carvedilol (COREG) 6.25 MG tablet TAKE 1 TABLET BY MOUTH TWICE DAILY WITH A MEAL   Cholecalciferol (VITAMIN D3) 50 MCG (2000 UT) CAPS Take 2,000 Units by mouth daily.   dapagliflozin propanediol (FARXIGA) 10 MG TABS tablet Take 1 tablet (10 mg total) by mouth daily.   FLUoxetine (PROZAC) 40 MG capsule Take 1 capsule (40  mg total) by mouth daily.   gabapentin (NEURONTIN) 300 MG capsule Take 1 capsule (300 mg total) by mouth 2 (two) times daily.   glucose blood (ONETOUCH ULTRA) test strip Check blood sugar 2-3 times daily.  Dx Code: E11.9   insulin glargine (LANTUS SOLOSTAR) 100 UNIT/ML Solostar Pen Inject 36 Units into the skin daily.   Insulin Pen Needle 32G X 4 MM MISC 1 Device by Does not apply route daily in the afternoon.   magnesium oxide (MAG-OX) 400 (240 Mg) MG tablet Take 1 tablet (400 mg total) by mouth 2 (two) times daily.   metFORMIN (GLUCOPHAGE) 1000 MG tablet Take 1 tablet (1,000 mg total) by mouth 2 (two) times daily with a meal.   Multiple Vitamins-Minerals (MULTIVITAMIN WITH MINERALS) tablet Take 1 tablet by mouth daily.   nitroGLYCERIN (NITROSTAT) 0.4 MG SL tablet Place 1 tablet (0.4 mg total) under the tongue every 5 (five) minutes x 3 doses as needed for chest pain (if no relief after 3rd dose, proceed to ED or call 911). (Patient not taking: Reported on 10/04/2023)   pantoprazole (PROTONIX) 40 MG tablet Take 1 tablet (40 mg total) by mouth 2 (two) times daily before a meal.   Potassium 99 MG TABS Take 99 mg by mouth daily.   primidone (MYSOLINE) 50 MG tablet TAKE 4 TABLETS BY MOUTH EVERY MORNING and TAKE 3 TABLETS AT BEDTIME   sacubitril-valsartan (ENTRESTO) 24-26 MG Take 1 tablet by mouth 2 (two) times daily.   spironolactone (ALDACTONE) 25 MG tablet Take 0.5 tablets (12.5 mg total) by mouth daily. Patient is only taking half due to pharmacy error   vitamin B-12 (CYANOCOBALAMIN) 1000 MCG tablet Take 1,000 mcg by mouth daily.   No facility-administered encounter medications on file as of 11/05/2023.     Objective:    PHYSICAL EXAMINATION:    VITALS:   There were no vitals filed for this visit.   No data found.     GEN:  The patient appears stated age and is in NAD. HEENT:  Normocephalic, atraumatic.  The mucous membranes are moist.   Neurological examination:  Orientation:  The patient is alert and oriented x3. Cranial nerves: There is good facial symmetry. The speech is fluent and clear.  Hearing is intact to conversational tone.  Movement examination: Abnormal movements: no rest tremor.  No significant postural tremor is noted today or intention tremor on either side. Gait and Station: Not tested, as I was not in his physical space and patient tends to be quite ataxic and unbalanced. I have reviewed and interpreted the following labs independently   Lab Results  Component Value Date   TSH 1.61 05/27/2022     Chemistry      Component Value Date/Time   NA 141 10/04/2023 1200   NA 141 01/20/2022 1056   K  4.4 10/04/2023 1200   CL 104 10/04/2023 1200   CO2 28 10/04/2023 1200   BUN 11 10/04/2023 1200   BUN 9 01/20/2022 1056   CREATININE 0.66 10/04/2023 1200   CREATININE 1.20 06/15/2016 0849      Component Value Date/Time   CALCIUM 8.8 10/04/2023 1200   ALKPHOS 53 12/07/2021 0040   AST 25 01/29/2023 1029   ALT 32 01/29/2023 1029   BILITOT 0.6 12/07/2021 0040       Lab Results  Component Value Date   HGBA1C 7.9 (A) 09/24/2023    Lab Results  Component Value Date   VITAMINB12 567 06/01/2023    Follow up Instructions      -I discussed the assessment and treatment plan with the patient. The patient was provided an opportunity to ask questions and all were answered. The patient agreed with the plan and demonstrated an understanding of the instructions.   The patient was advised to call back or seek an in-person evaluation if the symptoms worsen or if the condition fails to improve as anticipated.       Kerin Salen, DO   Cc:  [name], MD

## 2023-11-05 ENCOUNTER — Telehealth: Admitting: Neurology

## 2023-11-05 DIAGNOSIS — Q2730 Arteriovenous malformation, site unspecified: Secondary | ICD-10-CM | POA: Diagnosis not present

## 2023-11-05 DIAGNOSIS — G609 Hereditary and idiopathic neuropathy, unspecified: Secondary | ICD-10-CM | POA: Diagnosis not present

## 2023-11-05 DIAGNOSIS — G250 Essential tremor: Secondary | ICD-10-CM

## 2023-11-16 ENCOUNTER — Ambulatory Visit: Admitting: Orthopaedic Surgery

## 2023-11-16 DIAGNOSIS — M65311 Trigger thumb, right thumb: Secondary | ICD-10-CM | POA: Diagnosis not present

## 2023-11-16 NOTE — Progress Notes (Signed)
 73 year old male with right trigger thumb that had an injection earlier this month on the fifth and states the injection helped for couple days but now it is out in extension he cannot flex his thumb again and has sharp pain at the A1 pulley.  Additionally does have some type 2 diabetes history of adding Jenna in sleep apnea as well as hypertension.  He is not having any dyspnea and is echo showed ejection fraction 4045%.  I discussed patient retiring this my last week will have him see Dr.Arguwala for his right trigger thumb that did not respond to the injection.

## 2023-11-17 ENCOUNTER — Other Ambulatory Visit (INDEPENDENT_AMBULATORY_CARE_PROVIDER_SITE_OTHER): Payer: Self-pay

## 2023-11-17 ENCOUNTER — Ambulatory Visit (INDEPENDENT_AMBULATORY_CARE_PROVIDER_SITE_OTHER): Admitting: Orthopedic Surgery

## 2023-11-17 DIAGNOSIS — M79641 Pain in right hand: Secondary | ICD-10-CM | POA: Diagnosis not present

## 2023-11-17 DIAGNOSIS — M65311 Trigger thumb, right thumb: Secondary | ICD-10-CM | POA: Diagnosis not present

## 2023-11-17 NOTE — Progress Notes (Signed)
 Randy Castro. - 73 y.o. male MRN [id number]  Date of birth: Aug 05, 1951  Office Visit Note: Visit Date: 11/17/2023 PCP: [name], MD Referred by: [name], MD  Subjective: No chief complaint on file.  HPI: Randy Kafer. is a pleasant 73 y.o. male who presents today as a referral from my partner Dr. Kevan Ny for a right thumb trigger digit refractory to conservative care.  He was sent to me for specific hand surgical evaluation and potential surgical planning.  Trigger has been present now for roughly 3 months of the right thumb.  Underwent injection approximately 1 month prior with minimal relief, lasted approximately 2 to 3 days before symptoms recurred.  He is diabetic, recent A1c was 7.9.  No prior history of trigger digits.  Pertinent ROS were reviewed with the patient and found to be negative unless otherwise specified above in HPI.   Visit Reason: right thumb trigger digit Duration of symptoms:3 months Hand dominance: right Occupation: retired Diabetic: Yes/7.9 Smoking: No Heart/Lung History: none Blood Thinners:  none  Prior Testing/EMG: none Injections (Date): 10/27/23 Treatments: injection Prior Surgery: none  Assessment & Plan: Visit Diagnoses:  1. Trigger thumb, right thumb   2. Pain in right hand     Plan: Extensive discussion was had with the patient today regarding his right thumb trigger digit.  We discussed the etiology and pathophysiology of stenosing tenosynovitis.  We discussed conservative versus surgical treatment modalities.  From a conservative standpoint, we discussed activity modification, splinting, therapy and injections.  From a surgical standpoint, we discussed the possibility for trigger digit release as well as all risk and benefits associated.  Given that he has not responded to conservative treatments of cortisone injection to the right thumb A1 pulley for symptom relief, he is an appropriate candidate for right thumb trigger digit  release.  Risks and benefits of the procedure were discussed, risks including but not limited to infection, bleeding, scarring, stiffness, nerve injury, tendon injury, vascular injury, recurrence of symptoms and need for subsequent operation.  Patient expressed understanding.    Per patient request, we will move forward with surgical scheduling for the May timeframe.  This will give adequate time as well from his most recent injection to minimize infection risk.  I have also emphasized the importance of strict glucose control in the near future and leading into surgery to help minimize infection risk as well.  Will move forward with surgical scheduling of right thumb trigger digit release under local anesthesia.    Follow-up: No follow-ups on file.   Meds & Orders: No orders of the defined types were placed in this encounter.   Orders Placed This Encounter  Procedures   XR Hand Complete Right     Procedures: No procedures performed      Clinical History: No specialty comments available.  He reports that he quit smoking about 31 years ago. His smoking use included cigarettes. He started smoking about 56 years ago. He has a 25 pack-year smoking history. He has never been exposed to tobacco smoke. He has never used smokeless tobacco.  Recent Labs    09/24/23 1108  HGBA1C 7.9*    Objective:   Vital Signs: There were no vitals taken for this visit.  Physical Exam  Gen: Well-appearing, in no acute distress; non-toxic CV: Regular Rate. Well-perfused. Warm.  Resp: Breathing unlabored on room air; no wheezing. Psych: Fluid speech in conversation; appropriate affect; normal thought process  Ortho Exam  Right hand: - Palpable nodule at the A1 pulley of the thumb , associated tenderness - Notable clicking with IP flexion of the thumb, notable evidence of significant locking with deep flexion - Sensation intact distally, hand remains warm well-perfused   Imaging: XR Hand Complete  Right Result Date: 11/17/2023 X-rays of the right hand with stable appearance of the radiocarpal and midcarpal articulations.  Degenerative changes seen diffusely, particular at the thumb CMC interval.  Mild ulnar positivity without any notable impaction signs.   Past Medical/Family/Surgical/Social History: Medications & Allergies reviewed per EMR, new medications updated. 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   Abdominal pain 11/17/2018   Anemia 11/14/2017   OSA (obstructive sleep apnea) 09/21/2017   Obesity 01/23/2016   PCP NOTES >>> 06/08/2015   Anxiety and depression 05/31/2013   Intermediate coronary syndrome (HCC) 12/22/2012   Unstable angina (HCC) 12/16/2012   Fatigue 04/26/2012   Annual physical exam 10/28/2011   Rash 10/28/2011   Coronary Artery Disease 08/28/2011   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   Palpitations 08/28/2011   NSTEMI (non-ST elevated myocardial infarction) (HCC) 08/07/2011   DJD (degenerative joint disease) 05/28/2011   Poorly controlled type 2 diabetes mellitus with circulatory disorder (HCC)    Hyperlipidemia    HTN (hypertension)    Past Medical History:  Diagnosis Date   Anxiety    Arthritis    BCC (basal cell carcinoma of skin) 12/2020   CAD (coronary artery disease)    a. NSTEMI 12/12: EF 40-45%. ZOX:WRUEAVW balloon PTCA + Promus DES x 1 to mid LAD;  b. 11/2012: Cath with Cutting balloon PTCA  LAD for ISR to LAD, EF 55%;   c. 12/2012 Cath/PCI: LAD stents patent, 80 ost Diag (jailed)->PTCA, LCX/RCA patent. d. s/p NSTEMI in 11/2021 with DES to D1   Essential hypertension    GI bleed    History of blood transfusion    was getting 4 units   Hyperlipidemia    Ischemic cardiomyopathy    a.  echo 08/06/11: dist ant wall, apical and septal and infero-apical HK, mild LVH, EF 40%, mild LAE, PASP 34, asc aorta mildly dilated, mild TR;  b.  Echo (3/13) showed recovery of LV systolic function with EF 55-60%, grade I diastolic dysfunction, mild MR.    Myocardial infarction [facility])    twice with NSTEMI 2012   Palpitations    Type 2 diabetes mellitus (HCC)    Family History  Problem Relation Age of Onset   Diabetes Brother    Lung cancer Brother    Diabetes Mother    Coronary artery disease Father    Heart attack Father 56   Diabetes Sister    Coronary artery disease Sister    Colon cancer Neg Hx    Prostate cancer Neg Hx    Stroke Neg Hx    Esophageal cancer Neg Hx    Colon polyps Neg Hx    Rectal cancer Neg Hx    Stomach cancer Neg Hx    Past  Surgical History:  Procedure Laterality Date   ANTERIOR CERVICAL DECOMP/DISCECTOMY FUSION  in the 90s   CHOLECYSTECTOMY  03/27/2019   COLONOSCOPY  2021   COLONOSCOPY WITH ESOPHAGOGASTRODUODENOSCOPY (EGD)  10/2019   CORONARY ANGIOPLASTY  12/15/2012; 12/22/2012   CORONARY ANGIOPLASTY WITH STENT PLACEMENT  07/2011   "1" (12/22/2012)   CORONARY STENT INTERVENTION N/A 12/08/2021   Procedure: CORONARY STENT INTERVENTION;  Surgeon: Orbie Pyo, MD;  Location: [facility] INVASIVE CV LAB;  Service: Cardiovascular;  Laterality: N/A;   GASTRIC BYPASS  12/21/2017   KNEE ARTHROSCOPY Left 12/31/2016   LEFT HEART CATH AND CORONARY ANGIOGRAPHY N/A 12/08/2021   Procedure: LEFT HEART CATH AND CORONARY ANGIOGRAPHY;  Surgeon: Orbie Pyo, MD;  Location: [facility] INVASIVE CV LAB;  Service: Cardiovascular;  Laterality: N/A;   LEFT HEART CATHETERIZATION WITH CORONARY ANGIOGRAM N/A 08/06/2011    Procedure: LEFT HEART CATHETERIZATION WITH CORONARY ANGIOGRAM;  Surgeon: Kathleene Hazel, MD;  Location: [facility] CATH LAB;  Service: Cardiovascular;  Laterality: N/A;   LEFT HEART CATHETERIZATION WITH CORONARY ANGIOGRAM N/A 12/15/2012   Procedure: LEFT HEART CATHETERIZATION WITH CORONARY ANGIOGRAM;  Surgeon: Tonny Bollman, MD;  Location: [facility] CATH LAB;  Service: Cardiovascular;  Laterality: N/A;   LEFT HEART CATHETERIZATION WITH CORONARY ANGIOGRAM N/A 12/22/2012   Procedure: LEFT HEART CATHETERIZATION WITH CORONARY ANGIOGRAM;  Surgeon: Tonny Bollman, MD;  Location[facility] C[facility] LAB;  Service: Cardiovascular;  Laterality: N/A;   PERCUTANEOUS CORONARY STENT INTERVENTION (PCI-S) Right 12/15/2012   Procedure: PERCUTANEOUS CORONARY STENT INTERVENTION (PCI-S);  Surgeon: Tonny Bollman, MD;  Location: [facility] CATH LAB;  Service: Cardiovascular;  Laterality: Right;   Social History   Occupational History   Occupation: Environmental consultant  Tobacco Use   Smoking status: Former    Current packs/day: 0.00    Average packs/day: 1 pack/day for 25.0 years (25.0 ttl pk-yrs)    Types: Cigarettes    Start date: 04/13/1967    Quit date: 04/12/1992    Years since quitting: 31.6    Passive exposure: Never   Smokeless tobacco: Never   Tobacco comments:    Quit tobacco > 30 years ago  Vaping Use   Vaping status: Never Used  Substance and Sexual Activity   Alcohol use: Not Currently    Comment: 12/22/2012 "rarely maybe every 3-4 months, beer"   Drug use: No   Sexual activity: Yes    Diandre Merica Trevor Mace, M.D. [facility], Hand Surgery

## 2023-11-30 ENCOUNTER — Other Ambulatory Visit: Payer: Self-pay | Admitting: Neurology

## 2023-11-30 DIAGNOSIS — G250 Essential tremor: Secondary | ICD-10-CM

## 2023-12-23 DIAGNOSIS — M65311 Trigger thumb, right thumb: Secondary | ICD-10-CM | POA: Diagnosis not present

## 2023-12-28 DIAGNOSIS — E1129 Type 2 diabetes mellitus with other diabetic kidney complication: Secondary | ICD-10-CM | POA: Diagnosis not present

## 2023-12-29 ENCOUNTER — Other Ambulatory Visit: Payer: Self-pay | Admitting: Internal Medicine

## 2024-01-05 ENCOUNTER — Ambulatory Visit (INDEPENDENT_AMBULATORY_CARE_PROVIDER_SITE_OTHER): Admitting: Orthopedic Surgery

## 2024-01-05 DIAGNOSIS — Z9889 Other specified postprocedural states: Secondary | ICD-10-CM

## 2024-01-05 NOTE — Progress Notes (Signed)
   Randy Castro. - 73 y.o. male MRN [id number]  Date of birth: September 04, 1950  Office Visit Note: Visit Date: 01/05/2024 PCP: Ezell Hollow, MD Referred by: Ezell Hollow, MD  Subjective:  HPI: Randy Castro. is a 73 y.o. male who presents today for follow up 2 weeks status post right thumb trigger finger release.  He is doing very well overall, triggering has resolved, has been doing range of motion with the thumb as instructed.  Pain is well-controlled.  Pertinent ROS were reviewed with the patient and found to be negative unless otherwise specified above in HPI.   Assessment & Plan: Visit Diagnoses: No diagnosis found.  Plan: Sutures removed today, skin edges are well-healing.  There is no residual clicking or locking of the digit with range of motion today.  He is very pleased with his progress and outcome.  He can resume activities of daily living at this point without significant restriction.  Recommended follow-up in 1 month or on an as-needed basis.  Patient expressed understanding.  Follow-up: No follow-ups on file.   Meds & Orders: No orders of the defined types were placed in this encounter.  No orders of the defined types were placed in this encounter.    Procedures: No procedures performed       Objective:   Vital Signs: There were no vitals taken for this visit.  Ortho Exam Right hand: - Well-healing incision at the base of the thumb , skin is well-approximated, no erythema or drainage, sutures removed today - Able to perform full digital range of motion at the thumb IP joint 0-60 without residual clicking or locking - Sensation intact distally, hand is warm well-perfused   Imaging: No results found.   Randy Castro Randy Castro, M.D. [facility], Hand Surgery

## 2024-01-19 ENCOUNTER — Ambulatory Visit (INDEPENDENT_AMBULATORY_CARE_PROVIDER_SITE_OTHER): Payer: PPO | Admitting: Pharmacist

## 2024-01-19 DIAGNOSIS — I5022 Chronic systolic (congestive) heart failure: Secondary | ICD-10-CM

## 2024-01-19 DIAGNOSIS — Z794 Long term (current) use of insulin: Secondary | ICD-10-CM

## 2024-01-19 DIAGNOSIS — E1165 Type 2 diabetes mellitus with hyperglycemia: Secondary | ICD-10-CM

## 2024-01-19 DIAGNOSIS — R809 Proteinuria, unspecified: Secondary | ICD-10-CM

## 2024-01-19 NOTE — Progress Notes (Signed)
 Pharmacy Note  01/19/2024 Name: Randy Castro. MRN: [id number] DOB: December 05, 1950  Subjective: Randy Castro. is a 73 y.o. year old male who is a primary care patient of Ezell Hollow, MD. Clinical Pharmacist Practitioner referral was placed to assist with medication management.    Engaged with patient and his wife by telephone for follow up visit today.  Type 2 DM:  Last A1c was 7.9%.  Managed by Dr Rosalea Collin  - last visit 09/24/2023 Current therapy: Lantus  which was 36 units daily; Farxiga  10mg  daily and metformin  1000mg  twice a day.  Previous medications tried: Trulicity  and Ozempic  - stopped due to severe nausea and weakness.   Patient previously used Libre 3 Continuous Glucose sensors to check blood glucose but stopped about 6 months ago because he had trouble keeping sensor on for full 14 - 15 days.  Reports blood glucose mostly 130's Denies symptoms of hypoglycemia except he still has dizziness but has checked blood glucose and not related to low blood glucose.   Last urine microalbumin was abnormal - Patient had labs checked again by nephrologist but missed appointment to review labs.  Lab Results  Component Value Date   MICROALBUR 63.0 (H) 10/04/2023   MICROALBUR 64.8 (H) 09/30/2022    Has been snaking less at night.  Trying to limit intake of pasta and rice but his favorite food right now is spaghetti.       HFmrEF / HTN:  Current therapy: Entresto  24/26mg  twice a day, Farxiga  10mg  daily, carvedilol  6.25mg  twice a day and spironolactone  25mg  1/2 tablet daily  Adherence with Entresto  improved since qualified he for [facility]. Cost of Entresto  is current $0.  EF 04/23/2022 was 40-45% EF 05/2023 was also 40-45%   BP Readings from Last 3 Encounters:  10/04/23 138/60  09/24/23 130/80  09/06/23 132/74    Medication management:  Continues to use adhernece packaging with good results. He is using [facility] for adherence packaging He has a  [employer] for CHF medications to cover cost of Fraxiga and Entresto . Norberta Beans is active thru 02/13/2024  His wife reports he is taking vitamin D  50,000 units once a week - last vitamin D  level was low at 15.68   Objective: Review of patient status, including review of consultants reports, laboratory and other test data, was performed as part of comprehensive.  Lab Results  Component Value Date   CREATININE 0.66 10/04/2023   CREATININE 0.85 01/29/2023   CREATININE 0.61 09/30/2022    Lab Results  Component Value Date   HGBA1C 7.9 (A) 09/24/2023       Component Value Date/Time   CHOL 117 01/29/2023 1029   TRIG 321.0 (H) 01/29/2023 1029   HDL 43.30 01/29/2023 1029   CHOLHDL 3 01/29/2023 1029   VLDL 64.2 (H) 01/29/2023 1029   LDLCALC 40 11/27/2021 0930   LDLDIRECT 50.0 01/29/2023 1029     Clinical ASCVD: Yes  The ASCVD Risk score (Arnett DK, et al., 2019) failed to calculate for the following reasons:   Risk score cannot be calculated because patient has a medical history suggesting prior/existing ASCVD    BP Readings from Last 3 Encounters:  10/04/23 138/60  09/24/23 130/80  09/06/23 132/74     Allergies  Allergen Reactions   Ozempic  (0.25 Or 0.5 Mg-Dose) [Semaglutide (0.25 Or 0.5mg -Dos)] Nausea Only   Trulicity  [Dulaglutide ] Nausea And Vomiting    Medications Reviewed Today     Reviewed by Cecilie Coffee, RPH-CPP (Pharmacist)  on 01/19/24 at 0841  Med List Status: <None>   Medication Order Taking? Sig Documenting Provider Last Dose Status Informant  aspirin  EC 81 MG tablet [id number] Yes Take 81 mg by mouth daily. [provider] Taking Active Spouse/Significant Other  atorvastatin  (LIPITOR ) 80 MG tablet [id number] Yes Take 1 tablet (80 mg total) by mouth at bedtime. Gerard Knight, MD Taking Active   Blood Glucose Monitoring Suppl (ONE TOUCH ULTRA 2) w/Device KIT [id number] Yes by Does not apply route. [provider] Taking Active  Spouse/Significant Other  carvedilol  (COREG ) 6.25 MG tablet [id number] Yes TAKE 1 TABLET BY MOUTH TWICE DAILY WITH A MEAL Lasalle Pointer, NP Taking Active   Cholecalciferol (VITAMIN D3) 50 MCG (2000 UT) CAPS [id number]  Take 2,000 Units by mouth daily. [provider]  Active   dapagliflozin  propanediol (FARXIGA ) 10 MG TABS tablet [id number] Yes Take 1 tablet (10 mg total) by mouth daily. Shamleffer, Ibtehal Jaralla, MD Taking Active   FLUoxetine  (PROZAC ) 40 MG capsule [id number] Yes Take 1 capsule (40 mg total) by mouth daily. Ezell Hollow, MD Taking Active   gabapentin  (NEURONTIN ) 300 MG capsule [id number] Yes Take 1 capsule (300 mg total) by mouth 2 (two) times daily. Ezell Hollow, MD Taking Active   glucose blood [facility] ULTRA) test strip [id number] Yes Check blood sugar 2-3 times daily.  Dx Code: E11.9 Shamleffer, Ibtehal Jaralla, MD Taking Active   insulin  glargine (LANTUS  SOLOSTAR) 100 UNIT/ML Solostar Pen [id number] Yes Inject 36 Units into the skin daily. Shamleffer, Julian Obey, MD Taking Active   Insulin  Pen Needle 32G X 4 MM MISC [id number] Yes 1 Device by Does not apply route daily in the afternoon. Shamleffer, Julian Obey, MD Taking Active   magnesium  oxide (MAG-OX) 400 (240 Mg) MG tablet [id number]  Take 1 tablet (400 mg total) by mouth 2 (two) times daily. Paz, Jose E, MD  Active   metFORMIN  (GLUCOPHAGE ) 1000 MG tablet [id number] Yes Take 1 tablet (1,000 mg total) by mouth 2 (two) times daily with a meal. Shamleffer, Julian Obey, MD Taking Active   Multiple Vitamins-Minerals (MULTIVITAMIN WITH MINERALS) tablet [id number] Yes Take 1 tablet by mouth daily. [provider] Taking Active Spouse/Significant Other  nitroGLYCERIN  (NITROSTAT ) 0.4 MG SL tablet [id number]  Place 1 tablet (0.4 mg total) under the tongue every 5 (five) minutes x 3 doses as needed for chest pain (if no relief after 3rd dose, proceed to ED or call 911).  Patient not taking: Reported on 10/04/2023    Gerard Knight, MD  Active            Med Note [facility], KAYLYN D   Mon Oct 04, 2023 11:19 AM) PRN  pantoprazole  (PROTONIX ) 40 MG tablet [id number] Yes Take 1 tablet (40 mg total) by mouth 2 (two) times daily before a meal. Ezell Hollow, MD Taking Active   Potassium 99 MG TABS [id number] Yes Take 99 mg by mouth daily. [provider] Taking Active Spouse/Significant Other  primidone  (MYSOLINE ) 50 MG tablet [id number] Yes TAKE 4 TABLETS BY MOUTH EVERY MORNING and TAKE 3 TABLETS AT BEDTIME [name], Von Grumbling, DO Taking Active   sacubitril -valsartan  (ENTRESTO ) 24-26 MG [id number] Yes Take 1 tablet by mouth 2 (two) times daily. Gerard Knight, MD Taking Active   spironolactone  (ALDACTONE ) 25 MG tablet [id number] Yes Take 0.5 tablets (12.5 mg total) by mouth daily. Patient is only taking half due to pharmacy error  Patient taking differently: Take 12.5 mg by mouth daily.  Gerard Knight, MD Taking Active   vitamin B-12 (CYANOCOBALAMIN ) 1000 MCG tablet [id number] Yes Take 1,000 mcg by mouth daily. [provider] Taking Active Spouse/Significant Other  Vitamin D , Ergocalciferol , (DRISDOL ) 1.25 MG (50000 UNIT) CAPS capsule [id number] Yes Take 50,000 Units by mouth every 7 (seven) days. [provider] Taking Active               Medication Assistance:  Farxiga  and Entresto  obtained through [facility]  medication assistance program.  Enrollment ends 02/12/2053 Reapplied for [facility] today with patient - approved 02/14/2024 thru 02/12/2025    Assessment / Plan: Type 2 DM: A1c improving - goal < 7.5% Continue Lantus  to 36 units daily, Farxiga  10mg  daily and metformin  1000mg  twice a day.  Discussed increasing intake of vegetables - especially with meals with either rice or pasta.   Reminded to follow up with Dr Rosalea Collin   HFmrEF / HTN:  HF controlled; blood pressure at goal Continue spironolactone , carvedilol , Farxiga  and Entresto   Re-enrolled in  cardiomyopathy [employer] thru 02/12/2025 - mailed letter and card to patient and faxed info to his pharmacy, [facility]  Elevated Microalbumin - taking Farxiga  and on max tolerated Entresto  dose -Recommended patient call nephrology office to reschedule missed appointment and review recent labs.   Medication management:  Continue to use adhernece packaging  Reviewed and updated medication list Reviewed refill history and adherence  Follow Up:  Telephone follow up appointment with care management team member scheduled for:  5 - 6 months   Cecilie Coffee, PharmD Clinical Pharmacist [facility] Primary Care  - [facility] [phone]

## 2024-03-03 ENCOUNTER — Ambulatory Visit (INDEPENDENT_AMBULATORY_CARE_PROVIDER_SITE_OTHER): Payer: PPO | Admitting: Internal Medicine

## 2024-03-03 ENCOUNTER — Encounter: Payer: Self-pay | Admitting: Internal Medicine

## 2024-03-03 VITALS — BP 140/80 | HR 58 | Temp 97.2°F | Ht 74.0 in | Wt 229.0 lb

## 2024-03-03 DIAGNOSIS — I10 Essential (primary) hypertension: Secondary | ICD-10-CM | POA: Diagnosis not present

## 2024-03-03 DIAGNOSIS — E7800 Pure hypercholesterolemia, unspecified: Secondary | ICD-10-CM | POA: Diagnosis not present

## 2024-03-03 DIAGNOSIS — E1142 Type 2 diabetes mellitus with diabetic polyneuropathy: Secondary | ICD-10-CM | POA: Diagnosis not present

## 2024-03-03 DIAGNOSIS — E559 Vitamin D deficiency, unspecified: Secondary | ICD-10-CM

## 2024-03-03 DIAGNOSIS — Z794 Long term (current) use of insulin: Secondary | ICD-10-CM

## 2024-03-03 LAB — BASIC METABOLIC PANEL WITH GFR
BUN: 12 mg/dL (ref 6–23)
CO2: 30 meq/L (ref 19–32)
Calcium: 8.9 mg/dL (ref 8.4–10.5)
Chloride: 104 meq/L (ref 96–112)
Creatinine, Ser: 0.66 mg/dL (ref 0.40–1.50)
GFR: 93.7 mL/min (ref >60.00)
Glucose, Bld: 103 mg/dL — ABNORMAL HIGH (ref 70–99)
Potassium: 4.2 meq/L (ref 3.5–5.1)
Sodium: 140 meq/L (ref 135–145)

## 2024-03-03 LAB — MICROALBUMIN / CREATININE URINE RATIO
Creatinine,U: 74.8 mg/dL
Microalb Creat Ratio: 773.6 mg/g — ABNORMAL HIGH (ref 0.0–30.0)
Microalb, Ur: 57.9 mg/dL — ABNORMAL HIGH (ref 0.0–1.9)

## 2024-03-03 LAB — VITAMIN D 25 HYDROXY (VIT D DEFICIENCY, FRACTURES): VITD: 23.46 ng/mL — ABNORMAL LOW (ref 30.00–100.00)

## 2024-03-03 NOTE — Progress Notes (Signed)
 Subjective:    Patient ID: Randy Castro., male    DOB: Jan 14, 1951, 73 y.o.   MRN: [id number]  DOS:  03/03/2024 Type of visit - description: Routine checkup  Chronic medical problems addressed. Good med compliance. Denies chest pain or difficulty breathing.  No lower extremity edema. Neuropathy symptoms unchanged, moderate to severe. No nausea or vomiting.  No diarrhea or blood in the stools  Review of Systems See above   Past Medical History:  Diagnosis Date   Anxiety    Arthritis    BCC (basal cell carcinoma of skin) 12/2020   CAD (coronary artery disease)    a. NSTEMI 12/12: EF 40-45%. ERP:Rluupwh balloon PTCA + Promus DES x 1 to mid LAD;  b. 11/2012: Cath with Cutting balloon PTCA  LAD for ISR to LAD, EF 55%;  c. 12/2012 Cath/PCI: LAD stents patent, 80 ost Diag (jailed)->PTCA, LCX/RCA patent. d. s/p NSTEMI in 11/2021 with DES to D1   Essential hypertension    GI bleed    History of blood transfusion    was getting 4 units   Hyperlipidemia    Ischemic cardiomyopathy    a.  echo 08/06/11: dist ant wall, apical and septal and infero-apical HK, mild LVH, EF 40%, mild LAE, PASP 34, asc aorta mildly dilated, mild TR;  b.  Echo (3/13) showed recovery of LV systolic function with EF 55-60%, grade I diastolic dysfunction, mild MR.    Myocardial infarction [facility])    twice with NSTEMI 2012   Palpitations    Type 2 diabetes mellitus [facility])     Past Surgical History:  Procedure Laterality Date   ANTERIOR CERVICAL DECOMP/DISCECTOMY FUSION  in the 90s   CHOLECYSTECTOMY  03/27/2019   COLONOSCOPY  2021   COLONOSCOPY WITH ESOPHAGOGASTRODUODENOSCOPY (EGD)  10/2019   CORONARY ANGIOPLASTY  12/15/2012; 12/22/2012   CORONARY ANGIOPLASTY WITH STENT PLACEMENT  07/2011   1 (12/22/2012)   CORONARY STENT INTERVENTION N/A 12/08/2021   Procedure: CORONARY STENT INTERVENTION;  Surgeon: Wendel Lurena POUR, MD;  [facility] INV[facility] LAB;  Service: Cardiovascular;  Laterality: N/A;   GASTRIC BYPASS   12/21/2017   KNEE ARTHROSCOPY Left 12/31/2016   LEFT HEART CATH AND CORONARY ANGIOGRAPHY N/A 12/08/2021   Procedure: LEFT HEART CATH AND CORONARY ANGIOGRAPHY;  Surgeon: Wendel Lurena POUR, MD;  [facility]: [facility] LAB;  Service: Cardiovascular;  Laterality: N/A;   LEFT HEART CATHETERIZATION WITH CORONARY ANGIOGRAM N/A 08/06/2011   Procedure: LEFT HEART CATHETERIZATION WITH CORONARY ANGIOGRAM;  Surgeon: Lonni JONETTA Cash, MD;  Location: [facility] CATH LAB;  Service: Cardiovascular;  Laterality: N/A;   LEFT HEART CATHETERIZATION WITH CORONARY ANGIOGRAM N/A 12/15/2012   Procedure: LEFT HEART CATHETERIZATION WITH CORONARY ANGIOGRAM;  Surgeon: Ozell Fell, MD;  Location: [facility] CATH LAB;  Service: Cardiovascular;  Laterality: N/A;   LEFT HEART CATHETERIZATION WITH CORONARY ANGIOGRAM N/A 12/22/2012   Procedure: LEFT HEART CATHETERIZATION WITH CORONARY ANGIOGRAM;  Surgeon: Ozell Fell, MD;  Location: [facility] CATH LAB;  Service: Cardiovascular;  Laterality: N/A;   PERCUTANEOUS CORONARY STENT INTERVENTION (PCI-S) Right 12/15/2012   Procedure: PERCUTANEOUS CORONARY STENT INTERVENTION (PCI-S);  Surgeon: Ozell Fell, MD;  Location: [facility] CATH LAB;  Service: Cardiovascular;  Laterality: Right;    Current Outpatient Medications  Medication Instructions   aspirin  EC 81 mg, Daily   atorvastatin  (LIPITOR ) 80 mg, Oral, Daily at bedtime   Blood Glucose Monitoring Suppl (ONE TOUCH ULTRA 2) w/Device KIT by Does not apply route.   carvedilol  (COREG ) 6.25 mg, Oral, 2  times daily with meals   cyanocobalamin  (VITAMIN B12) 1,000 mcg, Daily   dapagliflozin  propanediol (FARXIGA ) 10 mg, Oral, Daily   FLUoxetine  (PROZAC ) 40 mg, Oral, Daily   gabapentin  (NEURONTIN ) 300 mg, Oral, 2 times daily   glucose blood (ONETOUCH ULTRA) test strip Check blood sugar 2-3 times daily.  Dx Code: E11.9   Insulin  Pen Needle 32G X 4 MM MISC 1 Device, Does not apply, Daily   Lantus  SoloStar 36 Units, Subcutaneous, Daily   magnesium  oxide (MAG-OX)  400 mg, Oral, 2 times daily   metFORMIN  (GLUCOPHAGE ) 1,000 mg, Oral, 2 times daily with meals   Multiple Vitamins-Minerals (MULTIVITAMIN WITH MINERALS) tablet 1 tablet, Daily   nitroGLYCERIN  (NITROSTAT ) 0.4 mg, Sublingual, Every 5 min x3 PRN   pantoprazole  (PROTONIX ) 40 mg, Oral, 2 times daily before meals   Potassium 99 mg, Daily   primidone  (MYSOLINE ) 50 MG tablet TAKE 4 TABLETS BY MOUTH EVERY MORNING and TAKE 3 TABLETS AT BEDTIME   sacubitril -valsartan  (ENTRESTO ) 24-26 MG 1 tablet, Oral, 2 times daily   spironolactone  (ALDACTONE ) 12.5 mg, Oral, Daily, Patient is only taking half due to pharmacy error   Vitamin D  (Ergocalciferol ) (DRISDOL ) 50,000 Units, Every 7 days   vitamin D3 2,000 Units, Daily       Objective:   Physical Exam BP (!) 140/80 (BP Location: Left Arm, Patient Position: Sitting, Cuff Size: Normal)   Pulse (!) 58   Temp (!) 97.2 F (36.2 C) (Oral)   Ht 6' 2 (1.88 m)   Wt 229 lb (103.9 kg)   SpO2 98%   BMI 29.40 kg/m  General:   Well developed, NAD, BMI noted. HEENT:  Normocephalic . Face symmetric, atraumatic Lungs:  CTA B Normal respiratory effort, no intercostal retractions, no accessory muscle use. Heart: RRR,  no murmur.  DM foot exam: No edema, good pedal pulses, pinprick examination decreased bilaterally Skin: Not pale. Not jaundice Neurologic:  alert & oriented X3.  Speech normal, gait appropriate for age, assisted by a cane  psych--  Cognition and judgment appear intact.  Cooperative with normal attention span and concentration.  Behavior appropriate. No anxious or depressed appearing.      Assessment    Assessment   DM : insulin  dependent,  w/ CAD CHF. Intol Trulicity , per endo Neuropathy, severe, documented by NCS 2022 HTN Hyperlipidemia CV: ---CAD  NSTEMI in 07/2011; unstable angina in 11/2012 and 12/2012. NSTMI 11-2021 .  ---CHF, ischemic cardiomyopathy, PVCs (Holter 2019 show 13.6% burden) OSA dx ~08-2017, not on CPAP as off 05-2023,  essentially asymptomatic since he lost weight after bariatric surgery Anxiety-depression Essential Tremor dx 2019  Skin cancer Morbid obesity:gastric sleeve, 11-2017 Dr Cara B12 deficiency Dx 2022 Vitamin D  deficiency Dx 05-2022 09-2019 GI bleed, gastric ulcer, required transfusion  PLAN DM: Per Endo.  Ambulatory CBGs in the 130s.  Has not seen Endo lately, referral sent.  Check micro Neuropathy: Severe, has numbness but no burning.  Diabetic shoe prescription printed. Reviewed  appropriate feet care. HTN: BP today 140/80, just got a BP cuff, recommend to check at home, goals provided.  Continue carvedilol , Entresto , Aldactone .  Check a BMP High cholesterol: On atorvastatin  80 mg, check FLP. Vitamin D  deficiency: On supplements, checking labs. Proteinuria: Due to see nephrology, plans to call. Tremors, gait instability, dizziness. LOV neurology 10/2023.  Uses a cane. AVMs per MRA.  Saw neurosurgery December 2024, angiogram ordered. RTC 4 months

## 2024-03-03 NOTE — Assessment & Plan Note (Signed)
 DM: Per Endo.  Ambulatory CBGs in the 130s.  Has not seen Endo lately, referral sent.  Check micro Neuropathy: Severe, has numbness but no burning.  Diabetic shoe prescription printed. Reviewed  appropriate feet care. HTN: BP today 140/80, just got a BP cuff, recommend to check at home, goals provided.  Continue carvedilol , Entresto , Aldactone .  Check a BMP High cholesterol: On atorvastatin  80 mg, check FLP. Vitamin D  deficiency: On supplements, checking labs. Proteinuria: Due to see nephrology, plans to call. Tremors, gait instability, dizziness. LOV neurology 10/2023.  Uses a cane. AVMs per MRA.  Saw neurosurgery December 2024, angiogram ordered. RTC 4 months

## 2024-03-03 NOTE — Patient Instructions (Addendum)
 Call your endocrinologist and arrange for office visit Call the nephrologist at [facility] kidney.  [phone].   Check the  blood pressure regularly Blood pressure goal:  between 110/65 and  135/85. If it is consistently higher or lower, let me know    GO TO THE LAB :  Get the blood work   Your results will be posted on MyChart with my comments  Next office visit for a checkup in 4 months Please make an appointment before you leave today

## 2024-03-04 ENCOUNTER — Ambulatory Visit: Payer: Self-pay | Admitting: Internal Medicine

## 2024-03-04 LAB — LIPID PANEL
Cholesterol: 131 mg/dL (ref <200)
HDL: 42 mg/dL (ref >=40)
LDL Cholesterol (Calc): 66 mg/dL
Non-HDL Cholesterol (Calc): 89 mg/dL (ref <130)
Total CHOL/HDL Ratio: 3.1 (calc) (ref <5.0)
Triglycerides: 155 mg/dL — ABNORMAL HIGH (ref <150)

## 2024-03-07 MED ORDER — VITAMIN D (ERGOCALCIFEROL) 1.25 MG (50000 UNIT) PO CAPS: 50000.0000 [IU] | ORAL_CAPSULE | ORAL | 0 refills | Status: AC

## 2024-03-08 ENCOUNTER — Other Ambulatory Visit: Payer: Self-pay | Admitting: Neurology

## 2024-03-08 ENCOUNTER — Other Ambulatory Visit: Payer: Self-pay | Admitting: Internal Medicine

## 2024-03-08 ENCOUNTER — Encounter: Payer: Self-pay | Admitting: Internal Medicine

## 2024-03-08 DIAGNOSIS — R809 Proteinuria, unspecified: Secondary | ICD-10-CM

## 2024-03-08 DIAGNOSIS — E1140 Type 2 diabetes mellitus with diabetic neuropathy, unspecified: Secondary | ICD-10-CM

## 2024-03-08 DIAGNOSIS — G250 Essential tremor: Secondary | ICD-10-CM

## 2024-03-13 ENCOUNTER — Telehealth: Payer: Self-pay

## 2024-03-13 NOTE — Telephone Encounter (Signed)
 Spoke w/ Randy Castro- informed Pt needs Ergocalciferol  once weekly for 12 weeks AND otc vit D once daily

## 2024-03-13 NOTE — Telephone Encounter (Signed)
 Copied from CRM [id number]. Topic: Clinical - Prescription Issue >> Mar 13, 2024 11:17 AM Macario HERO wrote: Reason for CRM: Odean from [facility] called regarding patient medication: Vitamin D , Ergocalciferol , (DRISDOL ) 1.25 MG (50000 UNIT) CAPS capsule [[id number]]. Stated she received this new prescription for 5000 Units and they need to verify if patient is still taking the old prescription: Cholecalciferol (VITAMIN D3) 50 MCG (2000 UT) CAPS [[id number]]? -- Call back: [phone]

## 2024-03-20 NOTE — Addendum Note (Signed)
 Addended by: Derrill Bagnell D on: 03/20/2024 09:42 AM   Modules accepted: Orders

## 2024-03-23 ENCOUNTER — Encounter: Payer: Self-pay | Admitting: Internal Medicine

## 2024-03-23 ENCOUNTER — Ambulatory Visit: Admitting: Internal Medicine

## 2024-03-23 VITALS — BP 126/82 | HR 71 | Ht 74.0 in | Wt 227.0 lb

## 2024-03-23 DIAGNOSIS — E1129 Type 2 diabetes mellitus with other diabetic kidney complication: Secondary | ICD-10-CM

## 2024-03-23 DIAGNOSIS — R809 Proteinuria, unspecified: Secondary | ICD-10-CM

## 2024-03-23 DIAGNOSIS — E1165 Type 2 diabetes mellitus with hyperglycemia: Secondary | ICD-10-CM | POA: Diagnosis not present

## 2024-03-23 DIAGNOSIS — Z794 Long term (current) use of insulin: Secondary | ICD-10-CM | POA: Diagnosis not present

## 2024-03-23 DIAGNOSIS — E1159 Type 2 diabetes mellitus with other circulatory complications: Secondary | ICD-10-CM

## 2024-03-23 DIAGNOSIS — E1142 Type 2 diabetes mellitus with diabetic polyneuropathy: Secondary | ICD-10-CM

## 2024-03-23 LAB — POCT GLYCOSYLATED HEMOGLOBIN (HGB A1C): Hemoglobin A1C: 7.9 % — AB (ref 4.0–5.6)

## 2024-03-23 LAB — POCT GLUCOSE (DEVICE FOR HOME USE): Glucose Fasting, POC: 121 mg/dL — AB (ref 70–99)

## 2024-03-23 MED ORDER — TIRZEPATIDE 5 MG/0.5ML ~~LOC~~ SOAJ
5.0000 mg | SUBCUTANEOUS | 3 refills | Status: DC
Start: 2024-03-23 — End: 2024-04-03

## 2024-03-23 MED ORDER — TIRZEPATIDE 2.5 MG/0.5ML ~~LOC~~ SOAJ
2.5000 mg | SUBCUTANEOUS | Status: DC
Start: 2024-03-23 — End: 2024-04-03

## 2024-03-23 NOTE — Patient Instructions (Addendum)
-   Start Mounjaro  2.5 mg weekly for 4 weeks, than increase to 5 mg weekly  - Continue Lantus  36 units ONCE daily  - Continue Metformin  1000 mg, 1 tablet with Breakfast and Supper - Continue Farxiga  10 mg, 1 tablet every morning       HOW TO TREAT LOW BLOOD SUGARS (Blood sugar LESS THAN 70 MG/DL) Please follow the RULE OF 15 for the treatment of hypoglycemia treatment (when your (blood sugars are less than 70 mg/dL)   STEP 1: Take 15 grams of carbohydrates when your blood sugar is low, which includes:  3-4 GLUCOSE TABS  OR 3-4 OZ OF JUICE OR REGULAR SODA OR ONE TUBE OF GLUCOSE GEL    STEP 2: RECHECK blood sugar in 15 MINUTES STEP 3: If your blood sugar is still low at the 15 minute recheck --> then, go back to STEP 1 and treat AGAIN with another 15 grams of carbohydrates.

## 2024-03-23 NOTE — Progress Notes (Signed)
 Name: Jaleil Renwick.  Age/ Sex: 73 y.o., male   MRN/ DOB: [id number], 04-06-1951     PCP: Amon Aloysius BRAVO, MD   Reason for Endocrinology Evaluation: Type 2 Diabetes Mellitus  Initial Endocrine Consultative Visit: 04/30/2020    PATIENT IDENTIFIER: Mr. Namon Villarin. is a 73 y.o. male with a past medical history of T2DM, CAD, Dyslipidemia and HTN. The patient has followed with Endocrinology clinic since 04/30/2020 for consultative assistance with management of his diabetes.  DIABETIC HISTORY:  Mr. Wolfinger was diagnosed with DM in 1998 , he has been on Janumet ,  Glimepiride  and Trulicity  with reported N/V with Trulicity .Was put on insulin  in 2012 following admission for NSTEMI . His hemoglobin A1c has ranged from 7.3% in 2013, peaking at 13.4% in 2019   Pt was established with Dr. Trixie from 2013 to 2014   On his initial visit to our clinic his A1c was 9.7 % . We continued Metformin  and changed NPH insulin  to insulin  Mix    Was unable to get the dexcom 06/2021 Farxiga  started 06/2021   Switch insulin  mix to basal insulin  10/2021  Started Ozempic  11/2022 but this was discontinued by the patient due to GI side effects   SUBJECTIVE:   During the last visit (09/24/2023): A1c 7.9% .     Today (03/23/2024): Mr. Morina is here for a follow up on diabetes. He has been checking glucose  2 x daily . He recently restarted on freestyle a week ago    Patient follows with cardiology for CAD, CHF and HTN  He continues to follow-up with neurology (Dr. Evonnie) for essential tremors, and gait instability  Denies nausea or vomiting  Denies diarrhea or constipation    HOME DIABETES REGIMEN:  Metformin  1000 mg twice daily Farxiga  10 mg daily Lantus  36 units daily      Statin: yes ACE-I/ARB: yes       DIABETIC COMPLICATIONS: Microvascular complications:  Neuropathy Denies: CKD, retinopathy Last Eye Exam: Completed 04/22/2023  Macrovascular complications:  CAD (S/P PCI )  Denies:  CVA, PVD   HISTORY:  Past Medical History:  Past Medical History:  Diagnosis Date   Anxiety    Arthritis    BCC (basal cell carcinoma of skin) 12/2020   CAD (coronary artery disease)    a. NSTEMI 12/12: EF 40-45%. ERP:Rluupwh balloon PTCA + Promus DES x 1 to mid LAD;  b. 11/2012: Cath with Cutting balloon PTCA  LAD for ISR to LAD, EF 55%;  c. 12/2012 Cath/PCI: LAD stents patent, 80 ost Diag (jailed)->PTCA, LCX/RCA patent. d. s/p NSTEMI in 11/2021 with DES to D1   Essential hypertension    GI bleed    History of blood transfusion    was getting 4 units   Hyperlipidemia    Ischemic cardiomyopathy    a.  echo 08/06/11: dist ant wall, apical and septal and infero-apical HK, mild LVH, EF 40%, mild LAE, PASP 34, asc aorta mildly dilated, mild TR;  b.  Echo (3/13) showed recovery of LV systolic function with EF 55-60%, grade I diastolic dysfunction, mild MR.    Myocardial infarction [facility])    twice with NSTEMI 2012   Palpitations    Type 2 diabetes mellitus [facility])    Past Surgical History:  Past Surgical History:  Procedure Laterality Date   ANTERIOR CERVICAL DECOMP/DISCECTOMY FUSION  in the 90s   CHOLECYSTECTOMY  03/27/2019   COLONOSCOPY  2021   COLONOSCOPY WITH ESOPHAGOGASTRODUODENOSCOPY (EGD)  10/2019   CORONARY ANGIOPLASTY  12/15/2012; 12/22/2012   CORONARY ANGIOPLASTY WITH STENT PLACEMENT  07/2011   1 (12/22/2012)   CORONARY STENT INTERVENTION N/A 12/08/2021   Procedure: CORONARY STENT INTERVENTION;  Surgeon: Wendel Lurena POUR, MD;  [facility];  Service: Cardiovascular;  Laterality: N/A;   GASTRIC BYPASS  12/21/2017   KNEE ARTHROSCOPY Left 12/31/2016   LEFT HEART CATH AND CORONARY ANGIOGRAPHY N/A 12/08/2021   Procedure: LEFT HEART CATH AND CORONARY ANGIOGRAPHY;  Surgeon: Wendel Lurena POUR, MD;  [facility];  Service: Cardiovascular;  Laterality: N/A;   LEFT HEART CATHETERIZATION WITH CORONARY ANGIOGRAM N/A 08/06/2011   Procedure: LEFT HEART  CATHETERIZATION WITH CORONARY ANGIOGRAM;  Surgeon: Lonni JONETTA Cash, MD;  Location: [facility] CATH LAB;  Service: Cardiovascular;  Laterality: N/A;   LEFT HEART CATHETERIZATION WITH CORONARY ANGIOGRAM N/A 12/15/2012   Procedure: LEFT HEART CATHETERIZATION WITH CORONARY ANGIOGRAM;  Surgeon: Ozell Fell, MD;  Location: [facility] CATH LAB;  Service: Cardiovascular;  Laterality: N/A;   LEFT HEART CATHETERIZATION WITH CORONARY ANGIOGRAM N/A 12/22/2012   Procedure: LEFT HEART CATHETERIZATION WITH CORONARY ANGIOGRAM;  Surgeon: Ozell Fell, MD;  Location: [facility] CATH LAB;  Service: Cardiovascular;  Laterality: N/A;   PERCUTANEOUS CORONARY STENT INTERVENTION (PCI-S) Right 12/15/2012   Procedure: PERCUTANEOUS CORONARY STENT INTERVENTION (PCI-S);  Surgeon: Ozell Fell, MD;  Location: [facility] CATH LAB;  Service: Cardiovascular;  Laterality: Right;   Social History:  reports that he quit smoking about 31 years ago. His smoking use included cigarettes. He started smoking about 56 years ago. He has a 25 pack-year smoking history. He has never been exposed to tobacco smoke. He has never used smokeless tobacco. He reports that he does not currently use alcohol. He reports that he does not use drugs. Family History:  Family History  Problem Relation Age of Onset   Diabetes Brother    Lung cancer Brother    Diabetes Mother    Coronary artery disease Father    Heart attack Father 51   Diabetes Sister    Coronary artery disease Sister    Colon cancer Neg Hx    Prostate cancer Neg Hx    Stroke Neg Hx    Esophageal cancer Neg Hx    Colon polyps Neg Hx    Rectal cancer Neg Hx    Stomach cancer Neg Hx      HOME MEDICATIONS: Allergies as of 03/23/2024       Reactions   Ozempic  (0.25 Or 0.5 Mg-dose) [semaglutide (0.25 Or 0.5mg -dos)] Nausea Only   Trulicity  [dulaglutide ] Nausea And Vomiting        Medication List        Accurate as of March 23, 2024  8:20 AM. If you have any questions, ask your nurse or doctor.           aspirin  EC 81 MG tablet Take 81 mg by mouth daily.   atorvastatin  80 MG tablet Commonly known as: LIPITOR  Take 1 tablet (80 mg total) by mouth at bedtime.   carvedilol  6.25 MG tablet Commonly known as: COREG  TAKE 1 TABLET BY MOUTH TWICE DAILY WITH A MEAL   cyanocobalamin  1000 MCG tablet Commonly known as: VITAMIN B12 Take 1,000 mcg by mouth daily.   dapagliflozin  propanediol 10 MG Tabs tablet Commonly known as: FARXIGA  Take 1 tablet (10 mg total) by mouth daily.   Entresto  24-26 MG Generic drug: sacubitril -valsartan  Take 1 tablet by mouth 2 (two) times daily.   FLUoxetine  40 MG capsule Commonly known  as: PROZAC  Take 1 capsule (40 mg total) by mouth daily.   gabapentin  300 MG capsule Commonly known as: NEURONTIN  Take 1 capsule (300 mg total) by mouth 2 (two) times daily.   Insulin  Pen Needle 32G X 4 MM Misc 1 Device by Does not apply route daily in the afternoon.   Lantus  SoloStar 100 UNIT/ML Solostar Pen Generic drug: insulin  glargine Inject 36 Units into the skin daily.   magnesium  oxide 400 (240 Mg) MG tablet Commonly known as: MAG-OX Take 1 tablet (400 mg total) by mouth 2 (two) times daily.   metFORMIN  1000 MG tablet Commonly known as: GLUCOPHAGE  Take 1 tablet (1,000 mg total) by mouth 2 (two) times daily with a meal.   multivitamin with minerals tablet Take 1 tablet by mouth daily.   nitroGLYCERIN  0.4 MG SL tablet Commonly known as: NITROSTAT  Place 1 tablet (0.4 mg total) under the tongue every 5 (five) minutes x 3 doses as needed for chest pain (if no relief after 3rd dose, proceed to ED or call 911).   ONE TOUCH ULTRA 2 w/Device Kit by Does not apply route.   OneTouch Ultra test strip Generic drug: glucose blood Check blood sugar 2-3 times daily.  Dx Code: E11.9   pantoprazole  40 MG tablet Commonly known as: PROTONIX  Take 1 tablet (40 mg total) by mouth 2 (two) times daily.   Potassium 99 MG Tabs Take 99 mg by mouth daily.    primidone  50 MG tablet Commonly known as: MYSOLINE  TAKE 4 TABLETS BY MOUTH EVERY MORNING and TAKE 3 TABLETS AT BEDTIME   spironolactone  25 MG tablet Commonly known as: ALDACTONE  Take 0.5 tablets (12.5 mg total) by mouth daily. Patient is only taking half due to pharmacy error What changed: additional instructions   Vitamin D  (Ergocalciferol ) 1.25 MG (50000 UNIT) Caps capsule Commonly known as: DRISDOL  Take 1 capsule (50,000 Units total) by mouth every 7 (seven) days.   vitamin D3 50 MCG (2000 UT) Caps Take 2,000 Units by mouth daily.         OBJECTIVE:   Vital Signs: BP 126/82 (BP Location: Left Arm, Patient Position: Sitting, Cuff Size: Normal)   Pulse 71   Ht 6' 2 (1.88 m)   Wt 227 lb (103 kg)   SpO2 99%   BMI 29.15 kg/m   Wt Readings from Last 3 Encounters:  03/23/24 227 lb (103 kg)  03/03/24 229 lb (103.9 kg)  10/04/23 228 lb 8 oz (103.6 kg)     Exam: General: Pt appears well and is in NAD  Lungs: Clear with good BS bilat   Heart: RRR   Extremities: No pretibial edema   Neuro: MS is good with appropriate affect, pt is alert and Ox3    DM foot exam: 03/23/2024   The skin of the feet is without sores or ulcerations, 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 The sensation is decreased to a screening 5.07, 10 gram monofilament bilaterally    DATA REVIEWED:  Lab Results  Component Value Date   HGBA1C 7.9 (A) 03/23/2024   HGBA1C 7.9 (A) 09/24/2023   HGBA1C 7.2 (H) 09/30/2022    Latest Reference Range & Units 03/03/24 11:15  Sodium 135 - 145 mEq/L 140  Potassium 3.5 - 5.1 mEq/L 4.2  Chloride 96 - 112 mEq/L 104  CO2 19 - 32 mEq/L 30  Glucose 70 - 99 mg/dL 896 (H)  BUN 6 -  23 mg/dL 12  Creatinine 9.59 - 8.49 mg/dL 9.33  Calcium  8.4 - 10.5 mg/dL 8.9  GFR >39.99 mL/min 93.70    Latest Reference Range & Units 03/03/24 11:15  Total CHOL/HDL Ratio <5.0 (calc) 3.1  Cholesterol <200 mg/dL 868   HDL Cholesterol > OR = 40 mg/dL 42  LDL Cholesterol (Calc) mg/dL (calc) 66  MICROALB/CREAT RATIO 0.0 - 30.0 mg/g 773.6 (H)  Non-HDL Cholesterol (Calc) <130 mg/dL (calc) 89  Triglycerides <150 mg/dL 844 (H)    Latest Reference Range & Units 03/03/24 11:15  Creatinine,U mg/dL 25.1  Microalb, Ur 0.0 - 1.9 mg/dL 42.0 (H)  MICROALB/CREAT RATIO 0.0 - 30.0 mg/g 773.6 (H)    In office BG 121 Mg/DL   ASSESSMENT / PLAN / RECOMMENDATIONS:   1) Type 2 Diabetes Mellitus, Sub-Optimally controlled, With neuropathic, and macrovascular  complications and microalbuminuria  - Most recent A1c of 7.9 %. Goal A1c < 7.0 %.    -His A1c continues to be above goal -He has reported intolerance with Trulicity  and Ozempic   with nausea and vomiting,  - We discussed the importance of optimizing glucose control to prevent further progression of microalbuminuria and neuropathic symptoms - We detained the idea of glipizide  versus Mounjaro , patient opted to try Mounjaro , caution against GI side effect just as the Trulicity  and Ozempic  have done - He was provided with #4 Mounjaro  sample pens 2.5 mg dose  MEDICATIONS: -Start Mounjaro  2.5 mg weekly for 4 weeks, then increase to 5 mg weekly -Continue Lantus  36 units daily  -Continue Metformin  1000 mg, 1 tablet with Breakfast and Supper -Continue  Farxiga  10 mg daily   EDUCATION / INSTRUCTIONS: BG monitoring instructions: Patient is instructed to check his blood sugars 2 times a day, before breakfast and Supper . Call [place] [facility] if: BG persistently < 70 I reviewed the Rule of 15 for the treatment of hypoglycemia in detail with the patient. Literature supplied.     2) Diabetic complications:  Eye: Does not have known diabetic retinopathy.  Neuro/ Feet: Does  have known diabetic peripheral neuropathy  Renal: Patient does not have known baseline CKD, but has microalbuminuria. He is  on an ACEI/ARB at present.     F/U in 4 months    Signed  electronically by: Stefano Redgie Butts, MD  [facility] Endocrinology  [facility] [address] [place] [place], [place] [address] Phone: [phone] FAX: [phone]   CC: Amon Aloysius BRAVO, MD [address] [place] [address] Phone: [phone]  Fax: [phone]  Return to Endocrinology clinic as below: Future Appointments  Date Time Provider Department Center  05/10/2024  3:20 PM Debera Jayson MATSU, MD [facility]  06/15/2024  9:00 AM [facility] CLINICAL SUPPORT [facility]  07/04/2024 10:20 AM Amon Aloysius BRAVO, MD [facility]  07/12/2024  8:30 AM [facility] PHARMACIST [facility]

## 2024-03-23 NOTE — Addendum Note (Signed)
 Addended by: OCTAVIO DIETRICH CROME on: 03/23/2024 08:37 AM   Modules accepted: Orders

## 2024-04-01 ENCOUNTER — Encounter: Payer: Self-pay | Admitting: Internal Medicine

## 2024-04-03 MED ORDER — GLIPIZIDE 5 MG PO TABS: 5.0000 mg | ORAL_TABLET | Freq: Every day | ORAL | 3 refills | Status: DC

## 2024-04-10 DIAGNOSIS — R809 Proteinuria, unspecified: Secondary | ICD-10-CM | POA: Diagnosis not present

## 2024-04-10 DIAGNOSIS — N181 Chronic kidney disease, stage 1: Secondary | ICD-10-CM | POA: Diagnosis not present

## 2024-04-10 DIAGNOSIS — E1122 Type 2 diabetes mellitus with diabetic chronic kidney disease: Secondary | ICD-10-CM | POA: Diagnosis not present

## 2024-04-10 DIAGNOSIS — I129 Hypertensive chronic kidney disease with stage 1 through stage 4 chronic kidney disease, or unspecified chronic kidney disease: Secondary | ICD-10-CM | POA: Diagnosis not present

## 2024-04-10 LAB — BASIC METABOLIC PANEL WITH GFR
BUN: 12 (ref 4–21)
CO2: 24 — AB (ref 13–22)
Chloride: 106 (ref 99–108)
Creatinine: 0.6 (ref 0.6–1.3)
Glucose: 114
Potassium: 4.1 meq/L (ref 3.5–5.1)
Sodium: 142 (ref 137–147)

## 2024-04-10 LAB — COMPREHENSIVE METABOLIC PANEL WITH GFR
Albumin: 4.2 (ref 3.5–5.0)
Calcium: 9.2 (ref 8.7–10.7)
eGFR: 103

## 2024-04-10 LAB — LAB REPORT - SCANNED: EGFR: 103

## 2024-04-17 ENCOUNTER — Encounter: Payer: Self-pay | Admitting: Internal Medicine

## 2024-04-19 ENCOUNTER — Encounter: Payer: Self-pay | Admitting: Internal Medicine

## 2024-04-19 NOTE — Telephone Encounter (Signed)
 Order has been placed through [employer] for the [place] 2 plus sensors

## 2024-04-27 DIAGNOSIS — E1165 Type 2 diabetes mellitus with hyperglycemia: Secondary | ICD-10-CM | POA: Diagnosis not present

## 2024-05-10 ENCOUNTER — Other Ambulatory Visit: Payer: Self-pay | Admitting: Cardiology

## 2024-05-10 ENCOUNTER — Encounter: Payer: Self-pay | Admitting: Cardiology

## 2024-05-10 ENCOUNTER — Ambulatory Visit: Attending: Cardiology | Admitting: Cardiology

## 2024-05-10 ENCOUNTER — Other Ambulatory Visit: Payer: Self-pay | Admitting: Nurse Practitioner

## 2024-05-10 ENCOUNTER — Other Ambulatory Visit: Payer: Self-pay | Admitting: Internal Medicine

## 2024-05-10 VITALS — BP 132/70 | HR 62 | Ht 74.0 in | Wt 226.0 lb

## 2024-05-10 DIAGNOSIS — E782 Mixed hyperlipidemia: Secondary | ICD-10-CM

## 2024-05-10 DIAGNOSIS — R001 Bradycardia, unspecified: Secondary | ICD-10-CM | POA: Diagnosis not present

## 2024-05-10 DIAGNOSIS — I25119 Atherosclerotic heart disease of native coronary artery with unspecified angina pectoris: Secondary | ICD-10-CM

## 2024-05-10 DIAGNOSIS — I5022 Chronic systolic (congestive) heart failure: Secondary | ICD-10-CM

## 2024-05-10 NOTE — Progress Notes (Signed)
    Cardiology Office Note  Date: 05/10/2024   ID: Victory GORMAN Randy Castro., DOB 11/25/1950, MRN [id number]  History of Present Illness: Randy Castro. is a 73 y.o. male last seen in January by Ms. Miriam NP, reviewed her note.  He is here for a routine visit.  Reports no angina or interval nitroglycerin  use, stable NYHA class II dyspnea.  No obvious fluid retention or weight gain.  Has enjoyed working in his wood shop, recently remodeling his kitchen.  Medications reviewed.  He reports compliance with current regimen, no obvious intolerances.  I reviewed his interval lab work.  LDL 66 in July.  Blood pressure rechecked by me today at 132/70.  I reviewed his ECG which shows sinus rhythm with PAC's, left bundle branch block.  Physical Exam: VS:  BP 132/70 (BP Location: Left Arm)   Pulse 62   Ht 6' 2 (1.88 m)   Wt 226 lb (102.5 kg)   SpO2 94%   BMI 29.02 kg/m , BMI Body mass index is 29.02 kg/m.  Wt Readings from Last 3 Encounters:  05/10/24 226 lb (102.5 kg)  03/23/24 227 lb (103 kg)  03/03/24 229 lb (103.9 kg)    General: Patient appears comfortable at rest. HEENT: Conjunctiva and lids normal. Neck: Supple, no elevated JVP or carotid bruits. Lungs: Clear to auscultation, nonlabored breathing at rest. Cardiac: Regular rate and rhythm, no S3, 1/6 systolic murmur. Extremities: No pitting edema.  ECG:  An ECG dated 12/02/2022 was personally reviewed today and demonstrated:  Sinus rhythm with prolonged PR interval and left bundle branch block.  Labwork: 10/04/2023: Hemoglobin 12.1; Platelets 285.0 04/10/2024: BUN 12; Creatinine 0.6; Potassium 4.1; Sodium 142     Component Value Date/Time   CHOL 131 03/03/2024 1115   TRIG 155 (H) 03/03/2024 1115   HDL 42 03/03/2024 1115   CHOLHDL 3.1 03/03/2024 1115   VLDL 64.2 (H) 01/29/2023 1029   LDLCALC 66 03/03/2024 1115   LDLDIRECT 50.0 01/29/2023 1029   Other Studies Reviewed Today:  No interval cardiac testing for review  today.  Assessment and Plan:  1.  CAD status post DES to the mid LAD in 2012, angioplasty for treatment of ISR of the mid LAD in 2014, and DES to the first diagonal in April 2023.  He does not report any angina or interval nitroglycerin  use.  ECG reviewed.  Continue aspirin  81 mg daily, Lipitor  80 mg daily, and as needed nitroglycerin .   2.  HFmrEF with ischemic cardiomyopathy, LVEF 40 to 45% by echocardiogram in October 2024.  Relatively stable with NYHA class II dyspnea, no fluid retention.  Continue Coreg  6.25 mg twice daily, Farxiga  10 mg daily, Entresto  24/26 mg twice daily, and Aldactone  12.5 mg daily.   3.  Primary hypertension.  No change in current regimen.   4.  Mixed hyperlipidemia.  LDL 66 in July.  Continue Lipitor  80 mg daily.   5.  Asymptomatic mildly dilated aortic root, 41 mm by echocardiogram in October 2024.  Disposition:  Follow up 6 months.  Signed, Jayson JUDITHANN Sierras, M.D., F.A.C.C. [facility] at [facility]

## 2024-05-10 NOTE — Patient Instructions (Addendum)

## 2024-06-01 DIAGNOSIS — M2042 Other hammer toe(s) (acquired), left foot: Secondary | ICD-10-CM | POA: Diagnosis not present

## 2024-06-01 DIAGNOSIS — M2041 Other hammer toe(s) (acquired), right foot: Secondary | ICD-10-CM | POA: Diagnosis not present

## 2024-06-01 DIAGNOSIS — E1165 Type 2 diabetes mellitus with hyperglycemia: Secondary | ICD-10-CM | POA: Diagnosis not present

## 2024-06-01 DIAGNOSIS — M21532 Acquired clawfoot, left foot: Secondary | ICD-10-CM | POA: Diagnosis not present

## 2024-06-05 ENCOUNTER — Other Ambulatory Visit: Payer: Self-pay | Admitting: Cardiology

## 2024-06-05 ENCOUNTER — Other Ambulatory Visit: Payer: Self-pay | Admitting: Neurology

## 2024-06-05 DIAGNOSIS — G250 Essential tremor: Secondary | ICD-10-CM

## 2024-06-09 DIAGNOSIS — E1165 Type 2 diabetes mellitus with hyperglycemia: Secondary | ICD-10-CM | POA: Diagnosis not present

## 2024-06-15 ENCOUNTER — Ambulatory Visit (INDEPENDENT_AMBULATORY_CARE_PROVIDER_SITE_OTHER): Payer: PPO | Admitting: *Deleted

## 2024-06-15 VITALS — Ht 74.0 in | Wt 225.0 lb

## 2024-06-15 DIAGNOSIS — Z0000 Encounter for general adult medical examination without abnormal findings: Secondary | ICD-10-CM | POA: Diagnosis not present

## 2024-06-15 NOTE — Patient Instructions (Addendum)
 Mr. Rogala , Thank you for taking time out of your busy schedule to complete your Annual Wellness Visit with me. I enjoyed our conversation and look forward to speaking with you again next year. I, as well as your care team,  appreciate your ongoing commitment to your health goals. Please review the following plan we discussed and let me know if I can assist you in the future. Your Game plan/ To Do List   Goal:   Goals Addressed             This Visit's Progress    DIET - EAT MORE FRUITS AND VEGETABLES   On track    Patient Stated       to walk 2-3 days a week          Follow up Visits: Next Medicare AWV with our clinical staff: 06/18/25 9:40am, telephone.   Next Office Visit with your provider: 08/18/24 1:40pm, Dr Amon.  Clinician Recommendations:  Aim for 30 minutes of exercise or brisk walking, 6-8 glasses of water, and 5 servings of fruits and vegetables each day.   You will need to get the following vaccines at your local pharmacy: Tetanus     This is a list of the screening recommended for you and due dates:  Health Maintenance  Topic Date Due   DTaP/Tdap/Td vaccine (2 - Td or Tdap) 06/01/2023   Flu Shot  03/24/2024   Medicare Annual Wellness Visit  06/14/2024   Eye exam for diabetics  05/20/2024   Hemoglobin A1C  09/23/2024   Colon Cancer Screening  12/25/2024   Yearly kidney health urinalysis for diabetes  03/03/2025   Complete foot exam   03/23/2025   Yearly kidney function blood test for diabetes  04/10/2025   Pneumococcal Vaccine for age over 52  Completed   Hepatitis C Screening  Completed   Zoster (Shingles) Vaccine  Completed   Meningitis B Vaccine  Aged Out   COVID-19 Vaccine  Discontinued   Cologuard (Stool DNA test)  Discontinued    Please let me know if you do not receive your Advanced Directive Packet within 1 week. Once completed and notarized, you may return a copy to our office by either of the following.  Advanced directives:  Bring a copy of  your health care power of attorney and living will to the office to be added to your chart at your convenience. You can mail a copy to [facility] [address] [place], [place] [address] or email to [email or web address]  Advance Care Planning is important because it:  [x]  Makes sure you receive the medical care that is consistent with your values, goals, and preferences  [x]  It provides guidance to your family and loved ones and reduces their decisional burden about whether or not they are making the right decisions based on your wishes.  Follow the link provided in your after visit summary or read over the paperwork we have mailed to you to help you started getting your Advance Directives in place. If you need assistance in completing these, please reach out to us  so that we can help you!  See attachments for Preventive Care and Fall Prevention Tips.

## 2024-06-15 NOTE — Progress Notes (Signed)
 Please attest this visit in the absence of patient primary care provider.   Subjective:   Randy Castro. is a 73 y.o. who presents for a Medicare Wellness preventive visit.  As a reminder, Annual Wellness Visits don't include a physical exam, and some assessments may be limited, especially if this visit is performed virtually. We may recommend an in-person follow-up visit with your provider if needed.  Visit Complete: Virtual I connected with  Randy Castro. on 06/15/24 by a audio enabled telemedicine application and verified that I am speaking with the correct person using two identifiers.  Patient Location: Home  Provider Location: Office/Clinic  I discussed the limitations of evaluation and management by telemedicine. The patient expressed understanding and agreed to proceed.  Vital Signs: Because this visit was a virtual/telehealth visit, some criteria may be missing or patient reported. Any vitals not documented were not able to be obtained and vitals that have been documented are patient reported.  VideoDeclined- This patient declined Librarian, academic. Therefore the visit was completed with audio only.  Persons Participating in Visit: Patient.  AWV Questionnaire: Yes: Patient Medicare AWV questionnaire was completed by the patient on 06/12/24; I have confirmed that all information answered by patient is correct and no changes since this date.  Cardiac Risk Factors include: advanced age (>18men, >33 women);male gender;diabetes mellitus;dyslipidemia;hypertension;Other (see comment), Risk factor comments: CAD, OSA, Hx of basal cell carcinoma     Objective:    Today's Vitals   06/15/24 0904  Weight: 225 lb (102.1 kg)  Height: 6' 2 (1.88 m)   Body mass index is 28.89 kg/m.     06/15/2024    9:15 AM 06/15/2023    9:47 AM 04/20/2023   12:50 PM 09/29/2022   10:57 AM 06/10/2022    2:23 PM 04/28/2022    9:04 AM 01/06/2022    8:37 AM   Advanced Directives  Does Patient Have a Medical Advance Directive? No No Yes Yes No No No  Type of Advance Directive   Living will      Would patient like information on creating a medical advance directive? Yes (MAU/Ambulatory/Procedural Areas - Information given) No - Patient declined   No - Patient declined  No - Patient declined    Current Medications (verified) Outpatient Encounter Medications as of 06/15/2024  Medication Sig   aspirin  EC 81 MG tablet Take 81 mg by mouth daily.   atorvastatin  (LIPITOR ) 80 MG tablet TAKE 1 TABLET BY MOUTH AT BEDTIME   Blood Glucose Monitoring Suppl (ONE TOUCH ULTRA 2) w/Device KIT by Does not apply route.   carvedilol  (COREG ) 6.25 MG tablet TAKE 1 TABLET BY MOUTH TWICE DAILY WITH A MEAL   Cholecalciferol (VITAMIN D3) 50 MCG (2000 UT) CAPS Take 2,000 Units by mouth daily.   dapagliflozin  propanediol (FARXIGA ) 10 MG TABS tablet Take 1 tablet (10 mg total) by mouth daily.   FLUoxetine  (PROZAC ) 40 MG capsule Take 1 capsule (40 mg total) by mouth daily.   gabapentin  (NEURONTIN ) 300 MG capsule Take 1 capsule (300 mg total) by mouth 2 (two) times daily.   glipiZIDE  (GLUCOTROL ) 5 MG tablet Take 1 tablet (5 mg total) by mouth daily before breakfast.   glucose blood (ONETOUCH ULTRA) test strip Check blood sugar 2-3 times daily.  Dx Code: E11.9   insulin  glargine (LANTUS  SOLOSTAR) 100 UNIT/ML Solostar Pen Inject 36 Units into the skin daily.   Insulin  Pen Needle 32G X 4 MM MISC 1 Device  by Does not apply route daily in the afternoon.   magnesium  oxide (MAG-OX) 400 (240 Mg) MG tablet Take 1 tablet (400 mg total) by mouth 2 (two) times daily.   metFORMIN  (GLUCOPHAGE ) 1000 MG tablet Take 1 tablet (1,000 mg total) by mouth 2 (two) times daily with a meal.   Multiple Vitamins-Minerals (MULTIVITAMIN WITH MINERALS) tablet Take 1 tablet by mouth daily.   nitroGLYCERIN  (NITROSTAT ) 0.4 MG SL tablet Place 1 tablet (0.4 mg total) under the tongue every 5 (five) minutes x 3  doses as needed for chest pain (if no relief after 3rd dose, proceed to ED or call 911).   pantoprazole  (PROTONIX ) 40 MG tablet Take 1 tablet (40 mg total) by mouth 2 (two) times daily.   Potassium 99 MG TABS Take 99 mg by mouth daily.   primidone  (MYSOLINE ) 50 MG tablet TAKE 4 TABLETS BY MOUTH EVERY MORNING and TAKE 3 TABLETS AT BEDTIME   sacubitril -valsartan  (ENTRESTO ) 24-26 MG TAKE 1 TABLET BY MOUTH TWICE DAILY   spironolactone  (ALDACTONE ) 25 MG tablet TAKE 1/2 TABLET BY MOUTH DAILY (cut in half)   vitamin B-12 (CYANOCOBALAMIN ) 1000 MCG tablet Take 1,000 mcg by mouth daily.   No facility-administered encounter medications on file as of 06/15/2024.    Allergies (verified) Ozempic  (0.25 or 0.5 mg-dose) [semaglutide (0.25 or 0.5mg -dos)] and Trulicity  [dulaglutide ]   History: Past Medical History:  Diagnosis Date   Anxiety    Arthritis    BCC (basal cell carcinoma of skin) 12/2020   CAD (coronary artery disease)    a. NSTEMI 12/12: EF 40-45%. ERP:Rluupwh balloon PTCA + Promus DES x 1 to mid LAD;  b. 11/2012: Cath with Cutting balloon PTCA  LAD for ISR to LAD, EF 55%;  c. 12/2012 Cath/PCI: LAD stents patent, 80 ost Diag (jailed)->PTCA, LCX/RCA patent. d. s/p NSTEMI in 11/2021 with DES to D1   Essential hypertension    GI bleed    History of blood transfusion    was getting 4 units   Hyperlipidemia    Ischemic cardiomyopathy    a.  echo 08/06/11: dist ant wall, apical and septal and infero-apical HK, mild LVH, EF 40%, mild LAE, PASP 34, asc aorta mildly dilated, mild TR;  b.  Echo (3/13) showed recovery of LV systolic function with EF 55-60%, grade I diastolic dysfunction, mild MR.    Myocardial infarction [facility])    twice with NSTEMI 2012   Palpitations    Type 2 diabetes mellitus [facility])    Past Surgical History:  Procedure Laterality Date   ANTERIOR CERVICAL DECOMP/DISCECTOMY FUSION  in the 90s   CHOLECYSTECTOMY  03/27/2019   COLONOSCOPY  2021   COLONOSCOPY WITH  ESOPHAGOGASTRODUODENOSCOPY (EGD)  10/2019   CORONARY ANGIOPLASTY  12/15/2012; 12/22/2012   CORONARY ANGIOPLASTY WITH STENT PLACEMENT  07/2011   1 (12/22/2012)   CORONARY STENT INTERVENTION N/A 12/08/2021   Procedure: CORONARY STENT INTERVENTION;  Surgeon: Wendel Lurena POUR, MD;  Location: [facility] INVASIVE CV LAB;  Service: Cardiovascular;  Laterality: N/A;   GASTRIC BYPASS  12/21/2017   KNEE ARTHROSCOPY Left 12/31/2016   LEFT HEART CATH AND CORONARY ANGIOGRAPHY N/A 12/08/2021   Procedure: LEFT HEART CATH AND CORONARY ANGIOGRAPHY;  Surgeon: Wendel Lurena POUR, MD;  Location: [facility] INVASIVE CV LAB;  Service: Cardiovascular;  Laterality: N/A;   LEFT HEART CATHETERIZATION WITH CORONARY ANGIOGRAM N/A 08/06/2011   Procedure: LEFT HEART CATHETERIZATION WITH CORONARY ANGIOGRAM;  Surgeon: Lonni JONETTA Cash, MD;  Location: [facility] CATH LAB;  Service: Cardiovascular;  Laterality: N/A;  LEFT HEART CATHETERIZATION WITH CORONARY ANGIOGRAM N/A 12/15/2012   Procedure: LEFT HEART CATHETERIZATION WITH CORONARY ANGIOGRAM;  Surgeon: Ozell Fell, MD;  Location: [facility] CATH LAB;  Service: Cardiovascular;  Laterality: N/A;   LEFT HEART CATHETERIZATION WITH CORONARY ANGIOGRAM N/A 12/22/2012   Procedure: LEFT HEART CATHETERIZATION WITH CORONARY ANGIOGRAM;  Surgeon: Ozell Fell, MD;  Location: [facility] CATH LAB;  Service: Cardiovascular;  Laterality: N/A;   PERCUTANEOUS CORONARY STENT INTERVENTION (PCI-S) Right 12/15/2012   Procedure: PERCUTANEOUS CORONARY STENT INTERVENTION (PCI-S);  Surgeon: Ozell Fell, MD;  Location[facility] CATH LAB;  Service: Cardiovascular;  Laterality: Right;   Family History  Problem Relation Age of Onset   Diabetes Brother    Lung cancer Brother    Diabetes Mother    Coronary artery disease Father    Heart attack Father 23   Diabetes Sister    Coronary artery disease Sister    Colon cancer Neg Hx    Prostate cancer Neg Hx    Stroke Neg Hx    Esophageal cancer Neg Hx    Colon polyps Neg Hx    Rectal cancer  Neg Hx    Stomach cancer Neg Hx    Social History   Socioeconomic History   Marital status: Married    Spouse name: Not on file   Number of children: 2    Years of education: Not on file   Highest education level: 12th grade  Occupational History   Occupation: Environmental consultant  Tobacco Use   Smoking status: Former    Current packs/day: 0.00    Average packs/day: 1 pack/day for 25.0 years (25.0 ttl pk-yrs)    Types: Cigarettes    Start date: 04/13/1967    Quit date: 04/12/1992    Years since quitting: 32.1    Passive exposure: Never   Smokeless tobacco: Never   Tobacco comments:    Quit tobacco > 30 years ago  Vaping Use   Vaping status: Never Used  Substance and Sexual Activity   Alcohol use: Not Currently    Comment: 12/22/2012 rarely maybe every 3-4 months, beer   Drug use: No   Sexual activity: Yes  Other Topics Concern   Not on file  Social History Narrative   Moved back to [place] from [facility] 2011   Household-- pt and wife   Hobby: Industrial/product designer   Social Drivers of Health   Financial Resource Strain: Low Risk  (06/12/2024)   Overall Financial Resource Strain (CARDIA)    Difficulty of Paying Living Expenses: Not hard at all  Food Insecurity: No Food Insecurity (06/12/2024)   Hunger Vital Sign    Worried About Running Out of Food in the Last Year: Never true    Ran Out of Food in the Last Year: Never true  Transportation Needs: No Transportation Needs (06/12/2024)   PRAPARE - Administrator, Civil Service (Medical): No    Lack of Transportation (Non-Medical): No  Physical Activity: Inactive (06/12/2024)   Exercise Vital Sign    Days of Exercise per Week: 0 days    Minutes of Exercise per Session: Not on file  Stress: Stress Concern Present (06/12/2024)   [employer] of Occupational Health - Occupational Stress Questionnaire    Feeling of Stress: To some extent  Social Connections: Socially Integrated (06/12/2024)   Social Connection  and Isolation Panel    Frequency of Communication with Friends and Family: More than three times a week    Frequency of Social Gatherings with Friends and  Family: Twice a week    Attends Religious Services: More than 4 times per year    Active Member of Clubs or Organizations: Yes    Attends Engineer, structural: More than 4 times per year    Marital Status: Married    Tobacco Counseling Counseling given: Not Answered Tobacco comments: Quit tobacco > 30 years ago    Clinical Intake:  Pre-visit preparation completed: Yes  Pain : No/denies pain     BMI - recorded: 28.89 Nutritional Status: BMI 25 -29 Overweight Nutritional Risks: None Diabetes: Yes CBG done?: No Did pt. bring in CBG monitor from home?: No  Lab Results  Component Value Date   HGBA1C 7.9 (A) 03/23/2024   HGBA1C 7.9 (A) 09/24/2023   HGBA1C 7.2 (H) 09/30/2022     How often do you need to have someone help you when you read instructions, pamphlets, or other written materials from your doctor or pharmacy?: 1 - Never  Interpreter Needed?: No  Information entered by :: Cael Worth, CMA(AAMA)   Activities of Daily Living     06/12/2024    2:56 PM  In your present state of health, do you have any difficulty performing the following activities:  Hearing? 0  Vision? 0  Difficulty concentrating or making decisions? 0  Walking or climbing stairs? 1  Dressing or bathing? 0  Doing errands, shopping? 0  Preparing Food and eating ? N  Using the Toilet? N  In the past six months, have you accidently leaked urine? N  Do you have problems with loss of bowel control? N  Managing your Medications? N  Managing your Finances? N  Housekeeping or managing your Housekeeping? N    Patient Care Team: Amon Aloysius BRAVO, MD as PCP - General (Internal Medicine) Debera Jayson MATSU, MD as PCP - Cardiology (Cardiology) Rolan Ezra RAMAN, MD as Consulting Physician (Cardiology) Ladora Gunnar DEL., MD as Referring  Physician (Gastroenterology) Dallis Prentice BROCKS, PA-C as Physician Assistant (Pulmonary Disease) Cara Arbour, MD as Consulting Physician [facility]) [name], [place]berry RAMAN, DO as Consulting Physician (Neurology) Carla Milling, RPH-CPP (Pharmacist) [facility], Donell Cardinal, MD as Consulting Physician (Endocrinology) [facility], [facility] (Nephrology) [facility], [facility], [place]  I have updated your Care Teams any recent Medical Services you may have received from other providers in the past year.     Assessment:   This is a routine wellness examination for Randy Castro.  Hearing/Vision screen Hearing Screening - Comments:: Denies hearing difficulties.  Vision Screening - Comments:: Due for eye exam and will schedule with [facility] in [place] soon.   Goals Addressed             This Visit's Progress    DIET - EAT MORE FRUITS AND VEGETABLES   On track    Patient Stated       to walk 2-3 days a week         Depression Screen     06/15/2024    9:12 AM 03/03/2024   10:14 AM 10/04/2023   11:23 AM 06/15/2023    9:45 AM 06/01/2023    1:29 PM 01/29/2023   10:22 AM 09/30/2022   10:13 AM  PHQ 2/9 Scores  PHQ - 2 Score 0 0 0 0 1 1 0  PHQ- 9 Score 3 0  0 3 3 5     Fall Risk     06/12/2024    2:56 PM 03/03/2024   10:13 AM 10/04/2023   11:22 AM  06/15/2023    9:47 AM 06/01/2023   12:59 PM  Fall Risk   Falls in the past year? 1 0 0 0 0  Comment PCP is aware      Number falls in past yr: 1 0 0 0 0  Injury with Fall? 1 0 0 0 0  Risk for fall due to : Impaired balance/gait No Fall Risks  No Fall Risks   Follow up Education provided Falls evaluation completed Falls evaluation completed;Education provided Falls prevention discussed Falls evaluation completed    MEDICARE RISK AT HOME:  Medicare Risk at Home Any stairs in or around the home?: (Patient-Rptd) Yes If so, are there any without handrails?: (Patient-Rptd) No Home free of loose throw rugs in walkways, pet  beds, electrical cords, etc?: (Patient-Rptd) No Adequate lighting in your home to reduce risk of falls?: (Patient-Rptd) Yes Life alert?: (Patient-Rptd) Yes Use of a cane, walker or w/c?: (Patient-Rptd) Yes Grab bars in the bathroom?: (Patient-Rptd) No Shower chair or bench in shower?: (Patient-Rptd) No Elevated toilet seat or a handicapped toilet?: (Patient-Rptd) No  TIMED UP AND GO:  Was the test performed?  No, audio  Cognitive Function: 6CIT completed    04/27/2017    9:35 AM  MMSE - Mini Mental State Exam  Orientation to time 5   Orientation to Place 5   Registration 3   Attention/ Calculation 5   Recall 3   Language- name 2 objects 2   Language- repeat 1  Language- follow 3 step command 3   Language- read & follow direction 1   Write a sentence 1   Copy design 1   Total score 30      Data saved with a previous flowsheet row definition        06/15/2024    9:19 AM 06/15/2023    9:49 AM 06/10/2022    2:31 PM  6CIT Screen  What Year? 0 points 0 points 0 points  What month? 0 points 0 points 0 points  What time? 0 points 0 points 0 points  Count back from 20 0 points 0 points 0 points  Months in reverse 0 points 0 points 0 points  Repeat phrase 2 points  0 points  Total Score 2 points  0 points    Immunizations Immunization History  Administered Date(s) Administered    sv, Bivalent, Protein Subunit Rsvpref,pf (Abrysvo) 07/20/2023   Fluad Quad(high Dose 65+) 05/04/2019, 05/29/2022   Fluad Trivalent(High Dose 65+) 05/08/2024   INFLUENZA, HIGH DOSE SEASONAL PF 05/31/2013, 05/27/2017, 06/07/2018   Influenza Split 06/30/2011   Influenza, Seasonal, Injecte, Preservative Fre 07/28/2012   Influenza,inj,Quad PF,6+ Mos 05/29/2014, 06/07/2015, 04/23/2016   Influenza-Unspecified 05/24/2020, 06/24/2021, 04/07/2023   Moderna Covid-19 Vaccine Bivalent Booster 19yrs & up 08/29/2021   PFIZER(Purple Top)SARS-COV-2 Vaccination 10/11/2019, 11/01/2019, 05/20/2020   Pneumococcal  Conjugate-13 12/05/2014   Pneumococcal Polysaccharide-23 08/07/2011, 10/12/2018   Tdap 05/31/2013   Zoster Recombinant(Shingrix ) 04/17/2021, 06/24/2021   Zoster, Live 11/28/2012    Screening Tests Health Maintenance  Topic Date Due   DTaP/Tdap/Td (2 - Td or Tdap) 06/01/2023   Medicare Annual Wellness (AWV)  06/14/2024   OPHTHALMOLOGY EXAM  05/20/2024   HEMOGLOBIN A1C  09/23/2024   Colonoscopy  12/25/2024   Diabetic kidney evaluation - Urine ACR  03/03/2025   FOOT EXAM  03/23/2025   Diabetic kidney evaluation - eGFR measurement  04/10/2025   Pneumococcal Vaccine: 50+ Years  Completed   Influenza Vaccine  Completed   Hepatitis C Screening  Completed   Zoster Vaccines- Shingrix   Completed   Meningococcal B Vaccine  Aged Out   COVID-19 Vaccine  Discontinued   Fecal DNA (Cologuard)  Discontinued    Health Maintenance Items Addressed: Will get tetanus booster at pharmacy and will to schedule DM eye exam soon.  Additional Screening:  Vision Screening: Recommended annual ophthalmology exams for early detection of glaucoma and other disorders of the eye. Is the patient up to date with their annual eye exam?  No  Who is the provider or what is the name of the office in which the patient attends annual eye exams? [facility]  Dental Screening: Recommended annual dental exams for proper oral hygiene  Community Resource Referral / Chronic Care Management: CRR required this visit?  No   CCM required this visit?  No   Plan:    I have personally reviewed and noted the following in the patient's chart:   Medical and social history Use of alcohol, tobacco or illicit drugs  Current medications and supplements including opioid prescriptions. Patient is not currently taking opioid prescriptions. Functional ability and status Nutritional status Physical activity Advanced directives List of other physicians Hospitalizations, surgeries, and ER visits in previous 12  months Vitals Screenings to include cognitive, depression, and falls Referrals and appointments  In addition, I have reviewed and discussed with patient certain preventive protocols, quality metrics, and best practice recommendations. A written personalized care plan for preventive services as well as general preventive health recommendations were provided to patient.   Lolita Libra, CMA   06/15/2024   After Visit Summary: (Mail) Due to this being a telephonic visit, the after visit summary with patients personalized plan was offered to patient via mail   Notes: Nothing significant to report at this time.

## 2024-06-21 ENCOUNTER — Ambulatory Visit: Admitting: Family Medicine

## 2024-06-21 ENCOUNTER — Ambulatory Visit (HOSPITAL_BASED_OUTPATIENT_CLINIC_OR_DEPARTMENT_OTHER)
Admission: RE | Admit: 2024-06-21 | Discharge: 2024-06-21 | Disposition: A | Discharge status: Home / Self Care | Source: Ambulatory Visit | Attending: Family Medicine | Admitting: Family Medicine

## 2024-06-21 ENCOUNTER — Encounter: Payer: Self-pay | Admitting: Family Medicine

## 2024-06-21 VITALS — BP 140/80 | HR 66 | Temp 97.6°F | Resp 18 | Ht 74.0 in | Wt 226.4 lb

## 2024-06-21 DIAGNOSIS — M25562 Pain in left knee: Secondary | ICD-10-CM | POA: Diagnosis not present

## 2024-06-21 DIAGNOSIS — G8929 Other chronic pain: Secondary | ICD-10-CM | POA: Insufficient documentation

## 2024-06-21 DIAGNOSIS — M1710 Unilateral primary osteoarthritis, unspecified knee: Secondary | ICD-10-CM | POA: Diagnosis not present

## 2024-06-21 NOTE — Progress Notes (Signed)
 [facility] [address], [address] [place], [place] [address] [phone]  Date:  06/21/2024   Name:  Randy Castro.   DOB:  [birth year]   MRN:  [id number]  PCP:  Amon Aloysius BRAVO, MD    Chief Complaint: Knee Pain (Left knee pain, no recent falls, pain increased about 1 week ago. Feels very unstable)   History of Present Illness:  Randy Castro. is a 73 y.o. very pleasant male patient who presents with the following:  Patient seen today with concern of knee pain.  He is a primary patient of my partner Dr. Amon History of CAD, hypertension, diabetes, NSTEMI I have not seen him myself previously  Discussed the use of AI scribe software for clinical note transcription with the patient, who gave verbal consent to proceed.  History of Present Illness [name]. is a 73 year old male with a history of left knee problems who presents with worsening left knee pain.  He has a history of left knee issues, including a previous knee scope and cortisone injections, with the last injection approximately four to five years ago. He was previously informed that surgery might be necessary, as he recalls being told it was 'kind of wore out.'  Approximately one month ago, he fell on his deck, twisting his left knee and landing on his side. The knee pain did not immediately worsen, but about a week ago, the pain intensified significantly, making ambulation difficult. The pain is primarily located on the medial aspect of the knee and radiates underneath the patella. He describes the knee as 'pretty crunchy' and reports significant swelling, which has slightly improved with rest over the past few days.  He manages the pain with Aleve and Tylenol , which provide some relief. He also uses a knee brace for support and finds that resting the knee alleviates the pain, though it worsens with activity.  In terms of family history, both of his brothers  have undergone bilateral knee replacements. He denies significant issues with his right knee.  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   Abdominal pain 11/17/2018   Anemia 11/14/2017   OSA (obstructive sleep apnea) 09/21/2017   Obesity 01/23/2016   PCP NOTES >>> 06/08/2015   Anxiety and depression 05/31/2013   Intermediate coronary syndrome (HCC) 12/22/2012   Unstable angina (HCC) 12/16/2012   Fatigue 04/26/2012   Annual physical exam 10/28/2011   Rash 10/28/2011   Coronary Artery Disease 08/28/2011   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   Palpitations 08/28/2011   NSTEMI (non-ST elevated myocardial infarction) (HCC) 08/07/2011   DJD (degenerative joint disease) 05/28/2011   Poorly controlled type 2 diabetes mellitus with circulatory disorder (HCC)    Hyperlipidemia    HTN (hypertension)     Past Medical History:  Diagnosis Date   Anxiety    Arthritis  BCC (basal cell carcinoma of skin) 12/2020   CAD (coronary artery disease)    a. NSTEMI 12/12: EF 40-45%. ERP:Rluupwh balloon PTCA + Promus DES x 1 to mid LAD;  b. 11/2012: Cath with Cutting balloon PTCA  LAD for ISR to LAD, EF 55%;  c. 12/2012 Cath/PCI: LAD stents patent, 80 ost Diag (jailed)->PTCA, LCX/RCA patent. d. s/p NSTEMI in 11/2021 with DES to D1   Essential hypertension    GI bleed    History of blood transfusion     was getting 4 units   Hyperlipidemia    Ischemic cardiomyopathy    a.  echo 08/06/11: dist ant wall, apical and septal and infero-apical HK, mild LVH, EF 40%, mild LAE, PASP 34, asc aorta mildly dilated, mild TR;  b.  Echo (3/13) showed recovery of LV systolic function with EF 55-60%, grade I diastolic dysfunction, mild MR.    Myocardial infarction [facility])    twice with NSTEMI 2012   Palpitations    Type 2 diabetes mellitus [facility])     Past Surgical History:  Procedure Laterality Date   ANTERIOR CERVICAL DECOMP/DISCECTOMY FUSION  in the 90s   CHOLECYSTECTOMY  03/27/2019   COLONOSCOPY  2021   COLONOSCOPY WITH ESOPHAGOGASTRODUODENOSCOPY (EGD)  10/2019   CORONARY ANGIOPLASTY  12/15/2012; 12/22/2012   CORONARY ANGIOPLASTY WITH STENT PLACEMENT  07/2011   1 (12/22/2012)   CORONARY STENT INTERVENTION N/A 12/08/2021   Procedure: CORONARY STENT INTERVENTION;  Surgeon: Wendel Lurena POUR, MD;  Location: [facility] INVASIVE CV LAB;  Service: Cardiovascular;  Laterality: N/A;   GASTRIC BYPASS  12/21/2017   KNEE ARTHROSCOPY Left 12/31/2016   LEFT HEART CATH AND CORONARY ANGIOGRAPHY N/A 12/08/2021   Procedure: LEFT HEART CATH AND CORONARY ANGIOGRAPHY;  Surgeon: Wendel Lurena POUR, MD;  Location: [facility] INVASIVE CV LAB;  Service: Cardiovascular;  Laterality: N/A;   LEFT HEART CATHETERIZATION WITH CORONARY ANGIOGRAM N/A 08/06/2011   Procedure: LEFT HEART CATHETERIZATION WITH CORONARY ANGIOGRAM;  Surgeon: Lonni JONETTA Cash, MD;  Location: [facility] CATH LAB;  Service: Cardiovascular;  Laterality: N/A;   LEFT HEART CATHETERIZATION WITH CORONARY ANGIOGRAM N/A 12/15/2012   Procedure: LEFT HEART CATHETERIZATION WITH CORONARY ANGIOGRAM;  Surgeon: Ozell Fell, MD;  Location: [facility] CATH LAB;  Service: Cardiovascular;  Laterality: N/A;   LEFT HEART CATHETERIZATION WITH CORONARY ANGIOGRAM N/A 12/22/2012   Procedure: LEFT HEART CATHETERIZATION WITH CORONARY ANGIOGRAM;  Surgeon: Ozell Fell, MD;  Location: [facility] CATH LAB;  Service:  Cardiovascular;  Laterality: N/A;   PERCUTANEOUS CORONARY STENT INTERVENTION (PCI-S) Right 12/15/2012   Procedure: PERCUTANEOUS CORONARY STENT INTERVENTION (PCI-S);  Surgeon: Ozell Fell, MD;  Location: [facility] CATH LAB;  Service: Cardiovascular;  Laterality: Right;    Social History   Tobacco Use   Smoking status: Former    Current packs/day: 0.00    Average packs/day: 1 pack/day for 25.0 years (25.0 ttl pk-yrs)    Types: Cigarettes    Start date: 04/13/1967    Quit date: 04/12/1992    Years since quitting: 32.2    Passive exposure: Never   Smokeless tobacco: Never   Tobacco comments:    Quit tobacco > 30 years ago  Vaping Use   Vaping status: Never Used  Substance Use Topics   Alcohol use: Not Currently    Comment: 12/22/2012 rarely maybe every 3-4 months, beer   Drug use: No    Family History  Problem Relation Age of Onset   Diabetes Brother    Lung cancer Brother    Diabetes Mother  Coronary artery disease Father    Heart attack Father 19   Diabetes Sister    Coronary artery disease Sister    Colon cancer Neg Hx    Prostate cancer Neg Hx    Stroke Neg Hx    Esophageal cancer Neg Hx    Colon polyps Neg Hx    Rectal cancer Neg Hx    Stomach cancer Neg Hx     Allergies  Allergen Reactions   Ozempic  (0.25 Or 0.5 Mg-Dose) [Semaglutide (0.25 Or 0.5mg -Dos)] Nausea Only   Trulicity  [Dulaglutide ] Nausea And Vomiting    Medication list has been reviewed and updated.  Current Outpatient Medications on File Prior to Visit  Medication Sig Dispense Refill   aspirin  EC 81 MG tablet Take 81 mg by mouth daily.     atorvastatin  (LIPITOR ) 80 MG tablet TAKE 1 TABLET BY MOUTH AT BEDTIME 90 tablet 0   Blood Glucose Monitoring Suppl (ONE TOUCH ULTRA 2) w/Device KIT by Does not apply route.     carvedilol  (COREG ) 6.25 MG tablet TAKE 1 TABLET BY MOUTH TWICE DAILY WITH A MEAL 180 tablet 3   Cholecalciferol (VITAMIN D3) 50 MCG (2000 UT) CAPS Take 2,000 Units by mouth daily.      dapagliflozin  propanediol (FARXIGA ) 10 MG TABS tablet Take 1 tablet (10 mg total) by mouth daily. 90 tablet 3   FLUoxetine  (PROZAC ) 40 MG capsule Take 1 capsule (40 mg total) by mouth daily. 90 capsule 1   gabapentin  (NEURONTIN ) 300 MG capsule Take 1 capsule (300 mg total) by mouth 2 (two) times daily. 180 capsule 1   glipiZIDE  (GLUCOTROL ) 5 MG tablet Take 1 tablet (5 mg total) by mouth daily before breakfast. 90 tablet 3   glucose blood (ONETOUCH ULTRA) test strip Check blood sugar 2-3 times daily.  Dx Code: E11.9 300 each 12   insulin  glargine (LANTUS  SOLOSTAR) 100 UNIT/ML Solostar Pen Inject 36 Units into the skin daily. 45 mL 3   Insulin  Pen Needle 32G X 4 MM MISC 1 Device by Does not apply route daily in the afternoon. 100 each 3   magnesium  oxide (MAG-OX) 400 (240 Mg) MG tablet Take 1 tablet (400 mg total) by mouth 2 (two) times daily. 180 tablet 1   metFORMIN  (GLUCOPHAGE ) 1000 MG tablet Take 1 tablet (1,000 mg total) by mouth 2 (two) times daily with a meal. 180 tablet 3   Multiple Vitamins-Minerals (MULTIVITAMIN WITH MINERALS) tablet Take 1 tablet by mouth daily.     nitroGLYCERIN  (NITROSTAT ) 0.4 MG SL tablet Place 1 tablet (0.4 mg total) under the tongue every 5 (five) minutes x 3 doses as needed for chest pain (if no relief after 3rd dose, proceed to ED or call 911). 25 tablet 3   pantoprazole  (PROTONIX ) 40 MG tablet Take 1 tablet (40 mg total) by mouth 2 (two) times daily. 180 tablet 1   Potassium 99 MG TABS Take 99 mg by mouth daily.     primidone  (MYSOLINE ) 50 MG tablet TAKE 4 TABLETS BY MOUTH EVERY MORNING and TAKE 3 TABLETS AT BEDTIME 630 tablet 0   sacubitril -valsartan  (ENTRESTO ) 24-26 MG TAKE 1 TABLET BY MOUTH TWICE DAILY 180 tablet 0   spironolactone  (ALDACTONE ) 25 MG tablet TAKE 1/2 TABLET BY MOUTH DAILY (cut in half) 45 tablet 0   vitamin B-12 (CYANOCOBALAMIN ) 1000 MCG tablet Take 1,000 mcg by mouth daily.     No current facility-administered medications on file prior to visit.     Review of Systems:  As per  HPI- otherwise negative.   Physical Examination: Vitals:   06/21/24 1520 06/21/24 1536  BP: (!) 154/88 (!) 140/80  Pulse: 66   Resp: 18   Temp: 97.6 F (36.4 C)   SpO2: 97%    Vitals:   06/21/24 1520  Weight: 226 lb 6.4 oz (102.7 kg)  Height: 6' 2 (1.88 m)   Body mass index is 29.07 kg/m. Ideal Body Weight: Weight in (lb) to have BMI = 25: 194.3  GEN: no acute distress.  Tall build, mild overweight.  Seen today with his wife HEENT: Atraumatic, Normocephalic.  Ears and Nose: No external deformity. CV: RRR, No M/G/R. No JVD. No thrill. No extra heart sounds. PULM: CTA B, no wheezes, crackles, rhonchi. No retractions. No resp. distress. No accessory muscle use. ABD: S, NT, ND, +BS. No rebound. No HSM. EXTR: No c/c/e PSYCH: Normally interactive. Conversant.  Favoring left knee slightly, he is using a cane.  Knee exam shows significant crepitus and decreased range of motion.  He has tenderness at the medial joint line.  No skin breakdown, redness or evidence of infection.  No significant effusion  Assessment and Plan: Chronic pain of left knee - Plan: DG Knee Complete 4 Views Left, Ambulatory referral to Orthopedic Surgery  Assessment & Plan Left knee pain with osteoarthritis Chronic left knee pain due to osteoarthritis, worsened by recent fall. Increased difficulty ambulating, swelling, and crepitus noted. Previous knee arthroscopy and steroid injections. Current management includes Aleve, Tylenol , and knee brace. - Order x-rays of the left knee. - Refer to [facility] in [place] for orthopedic evaluation. - Advise rest, elevation, and ice application as needed. - Discussed use of a cane for balance and support.  Signed Harlene Schroeder, MD

## 2024-06-21 NOTE — Patient Instructions (Signed)
 Good to see you today- please stop by imaging on the ground floor to have knee films We will then get you set up with [facility] in [place]- feel free to call them!  [facility] [address] [place], [place], [place] [address]  Phone: [phone]  Fax: [phone]

## 2024-06-23 ENCOUNTER — Encounter: Payer: Self-pay | Admitting: Family Medicine

## 2024-06-25 ENCOUNTER — Other Ambulatory Visit: Payer: Self-pay | Admitting: Internal Medicine

## 2024-06-26 ENCOUNTER — Encounter: Payer: Self-pay | Admitting: Radiology

## 2024-07-03 DIAGNOSIS — M1712 Unilateral primary osteoarthritis, left knee: Secondary | ICD-10-CM | POA: Diagnosis not present

## 2024-07-04 ENCOUNTER — Ambulatory Visit: Admitting: Internal Medicine

## 2024-07-05 ENCOUNTER — Other Ambulatory Visit: Payer: Self-pay | Admitting: Cardiology

## 2024-07-12 ENCOUNTER — Ambulatory Visit: Admitting: Pharmacist

## 2024-07-12 DIAGNOSIS — N181 Chronic kidney disease, stage 1: Secondary | ICD-10-CM | POA: Insufficient documentation

## 2024-07-12 DIAGNOSIS — I5022 Chronic systolic (congestive) heart failure: Secondary | ICD-10-CM

## 2024-07-12 DIAGNOSIS — E1142 Type 2 diabetes mellitus with diabetic polyneuropathy: Secondary | ICD-10-CM

## 2024-07-12 NOTE — Progress Notes (Addendum)
 07/12/2024 Name: Randy Castro. MRN: [id number] DOB: 06-21-1951  Chief Complaint  Patient presents with   Diabetes   Medication Management    Randy Castro. is a 73 y.o. year old male who presented for a telephone visit.   They were referred to the pharmacist by their PCP for assistance in managing diabetes and medication access.    Subjective:  Care Team: Primary Care Provider: Amon Aloysius BRAVO, MD ; Next Scheduled Visit: 08/18/2024 Cardiologist: Debera; Next Scheduled Visit: 11/13/2024 Endocrinologist Dr Sam; Next Scheduled Visit: 07/27/2024 Nephrology - Dr Alica - last appt was 04/10/2024  Medication Access/Adherence  Current Pharmacy:  [facility] - [place], [place] - [address] [place] [address] Phone: [phone] Fax: [phone]   Patient reports affordability concerns with their medications: No  Patient reports access/transportation concerns to their pharmacy: No  Patient reports adherence concerns with their medications:  No  using adherence packs from [facility]   Diabetes:  Current medications: Lantus  36 units daily, Farxiga  10mg  daily, Metformin  1000mg  twice a day and glipizide  5mg  once each morning  Medications tried in the past: Mounjaro , Trulicity  and Ozempic  - stopped due to nausea and weakness.  Current glucose readings: use Libre 2 sensors and reader.   Patient reports blood glucose has usually been between 80 and 180. His wife report about 4 or 5 low's per month. Patient has started having a bedtime snack over the last 3 or 4 weeks to prevent lows during the night. Patient reports he is able to sense when his blood glucose is low / has feelings of shakiness and dizziness. He reports he verifies lows with fingerstick blood glucose.  He was able to find the low blood glucose report on his reader:  No lows in the last 14 days 3 lows in the last 30 days 24 lows in the last 90 days.    Current medication  access support: none currently   Macrovascular and Microvascular Risk Reduction:  Statin? yes (atorvastatin  ); ACEi/ARB? 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 Cost of Entresto  is current $0.  EF 04/23/2022 was 40-45% EF 05/2023 was also 40-45%    BP Readings from Last 3 Encounters:  06/21/24 (!) 140/80  05/10/24 132/70  03/23/24 126/82      Medication management:  Continues to use adhernece packaging with good results. He is using [facility] for adherence packaging He has a [employer] for CHF medications to cover cost of Fraxiga and Entresto . Randy Castro is active thru 02/12/2025   His wife reports he is taking vitamin D  50,000 units once a week - last vitamin D  level had improved 23.46 (03/03/2024).  Previous vitamin D  level was 15.68 (06/01/2023)  Objective:  Lab Results  Component Value Date   HGBA1C 7.9 (A) 03/23/2024    Lab Results  Component Value Date   CREATININE 0.6 04/10/2024   BUN 12 04/10/2024   NA 142 04/10/2024   K 4.1 04/10/2024   CL 106 04/10/2024   CO2 24 (A) 04/10/2024    Lab Results  Component Value Date   CHOL 131 03/03/2024   HDL 42 03/03/2024   LDLCALC 66  03/03/2024   LDLDIRECT 50.0 01/29/2023   TRIG 155 (H) 03/03/2024   CHOLHDL 3.1 03/03/2024    Medications Reviewed Today     Reviewed by Carla Milling, RPH-CPP (Pharmacist) on 07/12/24 at 0903  Med List Status: <None>   Medication Order Taking? Sig Documenting Provider Last Dose Status Informant  aspirin  EC 81 MG tablet [id number] Yes Take 81 mg by mouth daily. [provider]  Active Spouse/Significant Other  atorvastatin  (LIPITOR ) 80 MG tablet [id number] Yes TAKE 1 TABLET BY MOUTH AT BEDTIME Debera Jayson MATSU, MD  Active   Blood Glucose Monitoring Suppl (ONE TOUCH ULTRA 2) w/Device KIT [id number] Yes by Does not apply route. [provider]  Active Spouse/Significant Other  carvedilol  (COREG ) 6.25 MG tablet [id number] Yes TAKE 1 TABLET BY MOUTH TWICE DAILY WITH A MEAL Debera Jayson MATSU, MD  Active   Cholecalciferol (VITAMIN D3) 50 MCG (2000 UT) CAPS [id number] Yes Take 2,000 Units by mouth daily. [provider]  Active   Continuous Glucose Receiver (FREESTYLE LIBRE 2 READER) [place]VI [id number] Yes by Does not apply route. [provider]  Active   Continuous Glucose Sensor (FREESTYLE LIBRE 2 SENSOR) [place] [id number] Yes by Does not apply route. [provider]  Active   dapagliflozin  propanediol (FARXIGA ) 10 MG TABS tablet [id number] Yes Take 1 tablet (10 mg total) by mouth daily. Shamleffer, Ibtehal Jaralla, MD  Active   FLUoxetine  (PROZAC ) 40 MG capsule [id number] Yes TAKE ONE CAPSULE BY MOUTH DAILY Webb, Padonda B, FNP  Active   gabapentin  (NEURONTIN ) 300 MG capsule [id number] Yes Take 1 capsule (300 mg total) by mouth 2 (two) times daily. Paz, Jose E, MD  Active   glipiZIDE  (GLUCOTROL ) 5 MG tablet [id number] Yes Take 1 tablet (5 mg total) by mouth daily before breakfast. Shamleffer, Ibtehal Jaralla, MD  Active   glucose blood (ONETOUCH ULTRA) test strip [id number] Yes Check blood sugar 2-3 times daily.  Dx Code: E11.9 Shamleffer, Ibtehal Jaralla, MD  Active    insulin  glargine (LANTUS  SOLOSTAR) 100 UNIT/ML Solostar Pen [id number] Yes Inject 36 Units into the skin daily. Shamleffer, Donell Cardinal, MD  Active   Insulin  Pen Needle 32G X 4 MM MISC [id number] Yes 1 Device by Does not apply route daily in the afternoon. Shamleffer, Donell Cardinal, MD  Active   magnesium  oxide (MAG-OX) 400 (240 Mg) MG tablet [id number] Yes Take 1 tablet (400 mg total) by mouth 2 (two) times daily. Paz, Jose E, MD  Active   metFORMIN  (GLUCOPHAGE ) 1000 MG tablet [id number] Yes Take 1 tablet (1,000 mg total) by mouth 2 (two) times daily with a meal. Shamleffer, Ibtehal Jaralla, MD  Active   Multiple Vitamins-Minerals (MULTIVITAMIN WITH MINERALS) tablet [id number] Yes Take 1 tablet by mouth daily. [provider]  Active Spouse/Significant Other  nitroGLYCERIN  (NITROSTAT ) 0.4 MG SL tablet [id number] Yes Place 1 tablet (0.4 mg total) under the tongue every 5 (five) minutes x 3 doses as needed for chest pain (if no relief after 3rd dose, proceed  to ED or call 911). Debera Jayson MATSU, MD  Active            Med Note [facility], KAYLYN D   Mon Oct 04, 2023 11:19 AM) PRN  pantoprazole  (PROTONIX ) 40 MG tablet [id number] Yes Take 1 tablet (40 mg total) by mouth 2 (two) times daily. Paz, Jose E, MD  Active   Potassium 99 MG TABS [id number]  Take 99 mg by mouth daily. [provider]  Active Spouse/Significant Other  primidone  (MYSOLINE ) 50 MG tablet [id number] Yes TAKE 4 TABLETS BY MOUTH EVERY MORNING and TAKE 3 TABLETS AT BEDTIME [name], Asberry RAMAN, DO  Active   sacubitril -valsartan  (ENTRESTO ) 24-26 MG [id number] Yes TAKE 1 TABLET BY MOUTH TWICE DAILY Debera Jayson MATSU, MD  Active   spironolactone  (ALDACTONE ) 25 MG tablet [id number] Yes TAKE 1/2 TABLET BY MOUTH DAILY (cut in half) Debera Jayson MATSU, MD  Active   vitamin B-12 (CYANOCOBALAMIN ) 1000 MCG tablet [id number] Yes Take 1,000 mcg by mouth daily. [provider]  Active Spouse/Significant Other  Vitamin D , Ergocalciferol ,  (DRISDOL ) 1.25 MG (50000 UNIT) CAPS capsule [id number] Yes Take 50,000 Units by mouth every 7 (seven) days. [provider]  Active               Assessment/Plan:   Diabetes: - Currently uncontrolled; goal A1c <7%. Cardiorenal risk reduction is optimized.. Blood pressure is not at goal <130/80. LDL is not at goal (would like to see LDL < 55 due to CAD + DM) - Reviewed goal A1c, goal fasting, and goal 2 hour post prandial glucose. Recommended to check glucose continuously and Reviewed dietary modifications including eating a small snack at bedtime of about 15 grams of CHO + protein to prevent overnight lows. Patient will follow up with Dr Sam in December for medication adjustment if needed. - Recommend to continue current diabetes regimen - Lantus , Glipizide , Farxiga  and metformin . - Patient needs new Libre reader. Good opportunity to upgrade to [place] 3 plus system. Sent request to Dr Sam thru [employer] ordering platform.   Heart Failure: - Currently appropriately managed - Reviewed goal blood pressure - Reviewed dietary modifications including limiting high sodium foods like hotdogs.  - Recommend to continue current meds for CHF - Farxiga , Entresto , Spironolactone    CKD G1/A3 - stable - continue to follow up with nephrology - Discussed blood pressure and diabetes control and importance for kidney health.  - continue to take Entresto , Farxiga  and spironolactone    Medication Management: - Currently strategy sufficient to maintain appropriate adherence to prescribed medication regimen - continue to use adherence packaging.  - will follow up in 2026 to review medication cost with patient and apply for [employer] grant if needed.    Follow Up Plan: 6 months  Madelin Ray, PharmD Clinical Pharmacist [facility]

## 2024-07-13 ENCOUNTER — Telehealth: Payer: Self-pay | Admitting: Pharmacist

## 2024-07-13 NOTE — Telephone Encounter (Signed)
 We had planned to up grade patient to the [place] 3 plus sensors and reader but per [facility] DME he is not eligible for a new reader yet.  Patient will continue with [facility] 2 plus sensors and reader he has at home.  Updated med list.

## 2024-07-27 ENCOUNTER — Ambulatory Visit: Admitting: Internal Medicine

## 2024-07-27 ENCOUNTER — Encounter: Payer: Self-pay | Admitting: Internal Medicine

## 2024-07-27 VITALS — BP 142/78 | Ht 74.0 in | Wt 220.0 lb

## 2024-07-27 DIAGNOSIS — E1129 Type 2 diabetes mellitus with other diabetic kidney complication: Secondary | ICD-10-CM

## 2024-07-27 DIAGNOSIS — E1159 Type 2 diabetes mellitus with other circulatory complications: Secondary | ICD-10-CM

## 2024-07-27 DIAGNOSIS — E1165 Type 2 diabetes mellitus with hyperglycemia: Secondary | ICD-10-CM | POA: Diagnosis not present

## 2024-07-27 DIAGNOSIS — R809 Proteinuria, unspecified: Secondary | ICD-10-CM

## 2024-07-27 DIAGNOSIS — Z794 Long term (current) use of insulin: Secondary | ICD-10-CM

## 2024-07-27 DIAGNOSIS — E1142 Type 2 diabetes mellitus with diabetic polyneuropathy: Secondary | ICD-10-CM | POA: Diagnosis not present

## 2024-07-27 LAB — POCT GLYCOSYLATED HEMOGLOBIN (HGB A1C): Hemoglobin A1C: 7 % — AB (ref 4.0–5.6)

## 2024-07-27 MED ORDER — FREESTYLE LIBRE 3 PLUS SENSOR MISC: 1.0000 | 3 refills | Status: AC

## 2024-07-27 MED ORDER — LANTUS SOLOSTAR 100 UNIT/ML ~~LOC~~ SOPN: 30.0000 [IU] | PEN_INJECTOR | Freq: Every day | SUBCUTANEOUS | 3 refills | Status: AC

## 2024-07-27 MED ORDER — DAPAGLIFLOZIN PROPANEDIOL 10 MG PO TABS: 10.0000 mg | ORAL_TABLET | Freq: Every day | ORAL | 3 refills | Status: AC

## 2024-07-27 MED ORDER — GLIPIZIDE 5 MG PO TABS: 5.0000 mg | ORAL_TABLET | Freq: Every day | ORAL | 3 refills | Status: AC

## 2024-07-27 MED ORDER — FREESTYLE LIBRE 3 READER DEVI: 1.0000 | Freq: Every day | 0 refills | Status: AC

## 2024-07-27 MED ORDER — METFORMIN HCL 1000 MG PO TABS: 1000.0000 mg | ORAL_TABLET | Freq: Two times a day (BID) | ORAL | 3 refills | Status: AC

## 2024-07-27 MED ORDER — INSULIN PEN NEEDLE 32G X 4 MM MISC: 1.0000 | Freq: Every day | 3 refills | Status: AC

## 2024-07-27 NOTE — Patient Instructions (Signed)
-   Continue Glipizide  5 mg, 1 tablet daily  - Continue Metformin  1000 mg, 1 tablet with Breakfast and Supper - Continue Farxiga  10 mg, 1 tablet every morning  - Decrease Lantus  30 units daily       HOW TO TREAT LOW BLOOD SUGARS (Blood sugar LESS THAN 70 MG/DL) Please follow the RULE OF 15 for the treatment of hypoglycemia treatment (when your (blood sugars are less than 70 mg/dL)   STEP 1: Take 15 grams of carbohydrates when your blood sugar is low, which includes:  3-4 GLUCOSE TABS  OR 3-4 OZ OF JUICE OR REGULAR SODA OR ONE TUBE OF GLUCOSE GEL    STEP 2: RECHECK blood sugar in 15 MINUTES STEP 3: If your blood sugar is still low at the 15 minute recheck --> then, go back to STEP 1 and treat AGAIN with another 15 grams of carbohydrates.

## 2024-07-27 NOTE — Progress Notes (Signed)
 Name: Randy Castro.  Age/ Sex: 73 y.o., male   MRN/ DOB: [id number], [birth year]     PCP: Amon Aloysius BRAVO, MD   Reason for Endocrinology Evaluation: Type 2 Diabetes Mellitus  Initial Endocrine Consultative Visit: 04/30/2020    PATIENT IDENTIFIER: [name]. is a 73 y.o. male with a past medical history of T2DM, CAD, Dyslipidemia and HTN. The patient has followed with Endocrinology clinic since 04/30/2020 for consultative assistance with management of his diabetes.  DIABETIC HISTORY:  [name] was diagnosed with DM in 1998 , he has been on Janumet ,  Glimepiride  and Trulicity  with reported N/V with Trulicity .Was put on insulin  in 2012 following admission for NSTEMI . His hemoglobin A1c has ranged from 7.3% in 2013, peaking at 13.4% in 2019   Pt was established with Dr. Trixie from 2013 to 2014   On his initial visit to our clinic his A1c was 9.7 % . We continued Metformin  and changed NPH insulin  to insulin  Mix    Was unable to get the dexcom 06/2021 Farxiga  started 06/2021   Switch insulin  mix to basal insulin  10/2021  Started Ozempic  11/2022 but this was discontinued by the patient due to GI side effects  Intolerant to Mounjaro  as well due to GI side effects 2025  SUBJECTIVE:   During the last visit (03/23/2024): A1c 7.9% .     Today (07/27/2024): [name] is here for a follow up on diabetes. He has been checking glucose multiple times daily through the freestyle libre. He has been noted with hypoglycemia. He is symptomatic.   He continues to use freestyle libre 2+, because he was not eligible to upgrade to a new reader  Patient follows with cardiology for CAD, CHF and HTN  He continues to follow-up with neurology (Dr. Evonnie) for essential tremors, and gait instability  No nausea  No constipation or diarrhea    HOME DIABETES REGIMEN:  Metformin  1000 mg twice daily Glipizide  5 mg, 1 tablet daily Farxiga  10 mg daily Lantus  36 units daily    Statin:  yes ACE-I/ARB: yes  CONTINUOUS GLUCOSE MONITORING RECORD INTERPRETATION    Dates of Recording: 11/13 - 07/19/2024  Sensor description: Freestyle libre 2  Results statistics:   CGM use % of time 13  Average and SD 117/43.6  Time in range      81%  % Time Above 180 14  % Time above 250 1  % Time Below target 4   Glycemic patterns summary: BGs trend down overnight and fluctuate with meals  Hyperglycemic episodes postprandial  Hypoglycemic episodes occurred overnight  Overnight periods: Trends down   DIABETIC COMPLICATIONS: Microvascular complications:  Neuropathy Denies: CKD, retinopathy Last Eye Exam: Completed 04/22/2023  Macrovascular complications:  CAD (S/P PCI )  Denies: CVA, PVD   HISTORY:  Past Medical History:  Past Medical History:  Diagnosis Date   Anxiety    Arthritis    BCC (basal cell carcinoma of skin) 12/2020   CAD (coronary artery disease)    a. NSTEMI 12/12: EF 40-45%. ERP:Rluupwh balloon PTCA + Promus DES x 1 to mid LAD;  b. 11/2012: Cath with Cutting balloon PTCA  LAD for ISR to LAD, EF 55%;  c. 12/2012 Cath/PCI: LAD stents patent, 80 ost Diag (jailed)->PTCA, LCX/RCA patent. d. s/p NSTEMI in 11/2021 with DES to D1   Essential hypertension    GI bleed    History of blood transfusion    was getting 4 units  Hyperlipidemia    Ischemic cardiomyopathy    a.  echo 08/06/11: dist ant wall, apical and septal and infero-apical HK, mild LVH, EF 40%, mild LAE, PASP 34, asc aorta mildly dilated, mild TR;  b.  Echo (3/13) showed recovery of LV systolic function with EF 55-60%, grade I diastolic dysfunction, mild MR.    Myocardial infarction Randy Castro)    twice with NSTEMI 2012   Palpitations    Type 2 diabetes mellitus [facility])    Past Surgical History:  Past Surgical History:  Procedure Laterality Date   ANTERIOR CERVICAL DECOMP/DISCECTOMY FUSION  in the 90s   CHOLECYSTECTOMY  03/27/2019   COLONOSCOPY  2021   COLONOSCOPY WITH ESOPHAGOGASTRODUODENOSCOPY  (EGD)  10/2019   CORONARY ANGIOPLASTY  12/15/2012; 12/22/2012   CORONARY ANGIOPLASTY WITH STENT PLACEMENT  07/2011   1 (12/22/2012)   CORONARY STENT INTERVENTION N/A 12/08/2021   Procedure: CORONARY STENT INTERVENTION;  Surgeon: Wendel Lurena POUR, MD;  [facility];  Service: Cardiovascular;  Laterality: N/A;   GASTRIC BYPASS  12/21/2017   KNEE ARTHROSCOPY Left 12/31/2016   LEFT HEART CATH AND CORONARY ANGIOGRAPHY N/A 12/08/2021   Procedure: LEFT HEART CATH AND CORONARY ANGIOGRAPHY;  Surgeon: Wendel Lurena POUR, MD;  [facility];  Service: Cardiovascular;  Laterality: N/A;   LEFT HEART CATHETERIZATION WITH CORONARY ANGIOGRAM N/A 08/06/2011   Procedure: LEFT HEART CATHETERIZATION WITH CORONARY ANGIOGRAM;  Surgeon: Lonni JONETTA Cash, MD;  Location: [facility] C[facility] LAB;  Service: Cardiovascular;  Laterality: N/A;   LEFT HEART CATHETERIZATION WITH CORONARY ANGIOGRAM N/A 12/15/2012   Procedure: LEFT HEART CATHETERIZATION WITH CORONARY ANGIOGRAM;  Surgeon: Ozell Fell, MD;  Location: [facility];  Service: Cardiovascular;  Laterality: N/A;   LEFT HEART CATHETERIZATION WITH CORONARY ANGIOGRAM N/A 12/22/2012   Procedure: LEFT HEART CATHETERIZATION WITH CORONARY ANGIOGRAM;  Surgeon: Ozell Fell, MD;  Location: [facility] CATH LAB;  Service: Cardiovascular;  Laterality: N/A;   PERCUTANEOUS CORONARY STENT INTERVENTION (PCI-S) Right 12/15/2012   Procedure: PERCUTANEOUS CORONARY STENT INTERVENTION (PCI-S);  Surgeon: Ozell Fell, MD;  Location: [facility] CATH LAB;  Service: Cardiovascular;  Laterality: Right;   Social History:  reports that he quit smoking about 32 years ago. His smoking use included cigarettes. He started smoking about 57 years ago. He has a 25 pack-year smoking history. He has never been exposed to tobacco smoke. He has never used smokeless tobacco. He reports that he does not currently use alcohol. He reports that he does not use drugs. Family History:  Family History  Problem  Relation Age of Onset   Diabetes Brother    Lung cancer Brother    Diabetes Mother    Coronary artery disease Father    Heart attack Father 52   Diabetes Sister    Coronary artery disease Sister    Colon cancer Neg Hx    Prostate cancer Neg Hx    Stroke Neg Hx    Esophageal cancer Neg Hx    Colon polyps Neg Hx    Rectal cancer Neg Hx    Stomach cancer Neg Hx      HOME MEDICATIONS: Allergies as of 07/27/2024       Reactions   Ozempic  (0.25 Or 0.5 Mg-dose) [semaglutide (0.25 Or 0.5mg -dos)] Nausea Only   Trulicity  [dulaglutide ] Nausea And Vomiting        Medication List        Accurate as of July 27, 2024  9:50 AM. If you have any questions, ask your nurse or doctor.  aspirin  EC 81 MG tablet Take 81 mg by mouth daily.   atorvastatin  80 MG tablet Commonly known as: LIPITOR  TAKE 1 TABLET BY MOUTH AT BEDTIME   carvedilol  6.25 MG tablet Commonly known as: COREG  TAKE 1 TABLET BY MOUTH TWICE DAILY WITH A MEAL   cyanocobalamin  1000 MCG tablet Commonly known as: VITAMIN B12 Take 1,000 mcg by mouth daily.   dapagliflozin  propanediol 10 MG Tabs tablet Commonly known as: FARXIGA  Take 1 tablet (10 mg total) by mouth daily.   Entresto  24-26 MG Generic drug: sacubitril -valsartan  TAKE 1 TABLET BY MOUTH TWICE DAILY   FLUoxetine  40 MG capsule Commonly known as: PROZAC  TAKE ONE CAPSULE BY MOUTH DAILY   FreeStyle Libre 2 Reader Devi by Does not apply route.   FreeStyle Libre 2 Sensor Misc by Does not apply route.   gabapentin  300 MG capsule Commonly known as: NEURONTIN  Take 1 capsule (300 mg total) by mouth 2 (two) times daily.   glipiZIDE  5 MG tablet Commonly known as: GLUCOTROL  Take 1 tablet (5 mg total) by mouth daily before breakfast.   Insulin  Pen Needle 32G X 4 MM Misc 1 Device by Does not apply route daily in the afternoon.   Lantus  SoloStar 100 UNIT/ML Solostar Pen Generic drug: insulin  glargine Inject 36 Units into the skin daily.    magnesium  oxide 400 (240 Mg) MG tablet Commonly known as: MAG-OX Take 1 tablet (400 mg total) by mouth 2 (two) times daily.   metFORMIN  1000 MG tablet Commonly known as: GLUCOPHAGE  Take 1 tablet (1,000 mg total) by mouth 2 (two) times daily with a meal.   multivitamin with minerals tablet Take 1 tablet by mouth daily.   nitroGLYCERIN  0.4 MG SL tablet Commonly known as: NITROSTAT  Place 1 tablet (0.4 mg total) under the tongue every 5 (five) minutes x 3 doses as needed for chest pain (if no relief after 3rd dose, proceed to ED or call 911).   ONE TOUCH ULTRA 2 w/Device Kit by Does not apply route.   OneTouch Ultra test strip Generic drug: glucose blood Check blood sugar 2-3 times daily.  Dx Code: E11.9   pantoprazole  40 MG tablet Commonly known as: PROTONIX  Take 1 tablet (40 mg total) by mouth 2 (two) times daily.   Potassium 99 MG Tabs Take 99 mg by mouth daily.   primidone  50 MG tablet Commonly known as: MYSOLINE  TAKE 4 TABLETS BY MOUTH EVERY MORNING and TAKE 3 TABLETS AT BEDTIME   spironolactone  25 MG tablet Commonly known as: ALDACTONE  TAKE 1/2 TABLET BY MOUTH DAILY (cut in half)   Vitamin D  (Ergocalciferol ) 1.25 MG (50000 UNIT) Caps capsule Commonly known as: DRISDOL  Take 50,000 Units by mouth every 7 (seven) days.   vitamin D3 50 MCG (2000 UT) Caps Take 2,000 Units by mouth daily.         OBJECTIVE:   Vital Signs: BP (!) 142/78   Ht 6' 2 (1.88 m)   Wt 220 lb (99.8 kg)   BMI 28.25 kg/m   Wt Readings from Last 3 Encounters:  07/27/24 220 lb (99.8 kg)  06/21/24 226 lb 6.4 oz (102.7 kg)  06/15/24 225 lb (102.1 kg)     Exam: General: Pt appears well and is in NAD  Lungs: Clear with good BS bilat   Heart: RRR   Extremities: No pretibial edema   Neuro: MS is good with appropriate affect, pt is alert and Ox3    DM foot exam: 03/23/2024   The skin of the feet  is without sores or ulcerations, claw feet deformity on the left, dystrophic toe nails   The pedal pulses are 2+ on right and 2+ on left. The sensation is decreased to a screening 5.07, 10 gram monofilament bilaterally    DATA REVIEWED:  Lab Results  Component Value Date   HGBA1C 7.0 (A) 07/27/2024   HGBA1C 7.9 (A) 03/23/2024   HGBA1C 7.9 (A) 09/24/2023    Latest Reference Range & Units 04/10/24 00:00  Basic metabolic panel with GFR  Rpt ! (E)  Comprehensive metabolic panel with GFR  Rpt (E)  Sodium 137 - 147  142 (E)  Potassium 3.5 - 5.1 mEq/L 4.1 (E)  Chloride 99 - 108  106 (E)  CO2 13 - 22  24 ! (E)  Glucose  114 (E)  BUN 4 - 21  12 (E)  Creatinine 0.6 - 1.3  0.6 (E)  Calcium  8.7 - 10.7  9.2 (E)  eGFR  103.0 103 (E)  Albumin 3.5 - 5.0  4.2 (E)  !: Data is abnormal (E): External lab result Rpt: View report in Results Review for more information   Latest Reference Range & Units 03/03/24 11:15  Total CHOL/HDL Ratio <5.0 (calc) 3.1  Cholesterol <200 mg/dL 868  HDL Cholesterol > OR = 40 mg/dL 42  LDL Cholesterol (Calc) mg/dL (calc) 66  MICROALB/CREAT RATIO 0.0 - 30.0 mg/g 773.6 (H)  Non-HDL Cholesterol (Calc) <130 mg/dL (calc) 89  Triglycerides <150 mg/dL 844 (H)    Latest Reference Range & Units 03/03/24 11:15  Creatinine,U mg/dL 25.1  Microalb, Ur 0.0 - 1.9 mg/dL 42.0 (H)  MICROALB/CREAT RATIO 0.0 - 30.0 mg/g 773.6 (H)    ASSESSMENT / PLAN / RECOMMENDATIONS:   1) Type 2 Diabetes Mellitus, Optimally controlled, With neuropathic, and macrovascular  complications and microalbuminuria  - Most recent A1c of 7.0 %. Goal A1c < 7.0 %.    -A1c has trended down from 7.9% to 7.0%, while on glipizide  -He has reported intolerance with Trulicity  , Ozempic  and Mounjaro  due to GI side effects -Patient has been noted with hypoglycemia, patient encouraged to contact the office in the future with hypoglycemia so I can adjust his medications accordingly -I will decrease his Lantus  as below - He was trying to switch from freestyle libre 2 to freestyle Export 3, but  [facility] (DME) indicated he is not legible.  His receiver has been running out of battery, I did send a prescription to [facility] local pharmacy in [place]  MEDICATIONS:  -Continue Metformin  1000 mg, 1 tablet with Breakfast and Supper -Continue glipizide  5 mg daily -Continue continue  Farxiga  10 mg daily  -Decrease Lantus  30 units daily  EDUCATION / INSTRUCTIONS: BG monitoring instructions: Patient is instructed to check his blood sugars 2 times a day, before breakfast and Supper . Call [place] [facility] if: BG persistently < 70 I reviewed the Rule of 15 for the treatment of hypoglycemia in detail with the patient. Literature supplied.     2) Diabetic complications:  Eye: Does not have known diabetic retinopathy.  Neuro/ Feet: Does  have known diabetic peripheral neuropathy  Renal: Patient does not have known baseline CKD, but has microalbuminuria. He is  on an ACEI/ARB at present.     F/U in 6 months    Signed electronically by: Stefano Redgie Butts, MD  [facility]  [facility] [address] [place] [place], [place] [address] Phone: [phone] FAX: [phone]   CC: Amon Aloysius BRAVO, MD [address] [place] [address] Phone: [phone]  Fax: [phone]  Return to Endocrinology clinic as below: Future Appointments  Date Time Provider Department [facility]  08/18/2024  1:40 PM Amon Aloysius BRAVO, MD [facility] [address]  11/13/2024 10:20 AM Debera Jayson MATSU, MD [facility]  01/17/2025  8:30 AM [facility] PHARMACIST [facility]  06/18/2025  9:40 AM [facility] ANNUAL WELLNESS VISIT 1 [facility] [address]

## 2024-08-02 ENCOUNTER — Other Ambulatory Visit: Payer: Self-pay | Admitting: Internal Medicine

## 2024-08-03 ENCOUNTER — Other Ambulatory Visit: Payer: Self-pay | Admitting: Internal Medicine

## 2024-08-03 ENCOUNTER — Other Ambulatory Visit: Payer: Self-pay | Admitting: Cardiology

## 2024-08-07 ENCOUNTER — Other Ambulatory Visit: Payer: Self-pay | Admitting: Cardiology

## 2024-08-07 DIAGNOSIS — I5022 Chronic systolic (congestive) heart failure: Secondary | ICD-10-CM

## 2024-08-18 ENCOUNTER — Encounter: Payer: Self-pay | Admitting: Internal Medicine

## 2024-08-18 ENCOUNTER — Ambulatory Visit: Admitting: Internal Medicine

## 2024-08-18 VITALS — BP 138/72 | HR 67 | Ht 74.0 in | Wt 224.8 lb

## 2024-08-18 DIAGNOSIS — Z23 Encounter for immunization: Secondary | ICD-10-CM

## 2024-08-18 DIAGNOSIS — E1142 Type 2 diabetes mellitus with diabetic polyneuropathy: Secondary | ICD-10-CM

## 2024-08-18 DIAGNOSIS — N181 Chronic kidney disease, stage 1: Secondary | ICD-10-CM | POA: Diagnosis not present

## 2024-08-18 DIAGNOSIS — E538 Deficiency of other specified B group vitamins: Secondary | ICD-10-CM

## 2024-08-18 DIAGNOSIS — L821 Other seborrheic keratosis: Secondary | ICD-10-CM | POA: Diagnosis not present

## 2024-08-18 DIAGNOSIS — Z794 Long term (current) use of insulin: Secondary | ICD-10-CM | POA: Diagnosis not present

## 2024-08-18 DIAGNOSIS — R269 Unspecified abnormalities of gait and mobility: Secondary | ICD-10-CM | POA: Diagnosis not present

## 2024-08-18 DIAGNOSIS — E559 Vitamin D deficiency, unspecified: Secondary | ICD-10-CM

## 2024-08-18 DIAGNOSIS — C439 Malignant melanoma of skin, unspecified: Secondary | ICD-10-CM

## 2024-08-18 NOTE — Patient Instructions (Addendum)
 GO TO THE LAB :  Get the blood work    Then, go to the front desk for the checkout Please make an appointment for a checkup in 4 months      Check the  blood pressure regularly Blood pressure goal:  between 110/65 and  135/85. If it is consistently higher or lower, let me know  Diabetes  You can check your sugars at different times  - early in AM fasting  ( blood sugar goal 70-130) - 2 hours after a meal (blood sugar goal less than 180)       POLYNEUROPATHY WITH GAIT ABNORMALITY AND HISTORY OF FALLS: Your balance issues and falls are likely due to your neuropathy. Your current cane is not providing enough stability. -You are referred to physical therapy in [facility] for a walker fitting.  SEBORRHEIC KERATOSIS, LEFT CHEEK: You have a seborrheic keratosis on your left cheek that bleeds due to shaving and trauma. -You are referred to a dermatologist  for evaluation and management.  CONGESTIVE HEART FAILURE and hypertension: Your heart failure is well-managed with no recent issues. Your blood pressure is under control. -Continue your current medications     VITAMIN B12 DEFICIENCY: Your vitamin B12 levels are managed with daily supplements. -Your B12 levels were checked today.  VITAMIN D  DEFICIENCY: Your vitamin D  levels are managed with daily supplements. -Your vitamin D  levels were checked today.  GENERAL HEALTH MAINTENANCE: We discussed your vaccinations. -You received a pneumonia vaccine today. -You are encouraged to get a COVID vaccine at the pharmacy.

## 2024-08-18 NOTE — Progress Notes (Signed)
 "  Subjective:    Patient ID: Randy GORMAN [name], male    DOB: [birth year], 73 y.o.   MRN: [id number]  DOS:  08/18/2024 Follow-up  Discussed the use of AI scribe software for clinical note transcription with the patient, who gave verbal consent to proceed.  History of Present Illness Randy Mickelson. is a 73 year old male with neuropathy who presents with worsening balance issues and falls.  Gait instability and falls - Progressively worsening balance over time - Multiple recent falls without major injuries - Currently uses a cane, but feels it no longer provides adequate stability - Family encourages use of a walker  Peripheral neuropathy and musculoskeletal pain - History of neuropathy contributing to balance difficulties - Intermittent painful knee limiting ambulation and exacerbating balance issues  Neurological symptoms - No stroke-like symptoms - No speech changes or double vision  Cardiopulmonary symptoms and blood pressure control - No recent chest pain, dyspnea, leg swelling, or palpitations - Home blood pressure readings have been normal  Cutaneous lesion - Bleeding (w/ shaving) skin lesion @ L cheek     Review of Systems See above   Past Medical History:  Diagnosis Date   Anxiety    Arthritis    BCC (basal cell carcinoma of skin) 12/2020   CAD (coronary artery disease)    a. NSTEMI 12/12: EF 40-45%. ERP:Rluupwh balloon PTCA + Promus DES x 1 to mid LAD;  b. 11/2012: Cath with Cutting balloon PTCA  LAD for ISR to LAD, EF 55%;  c. 12/2012 Cath/PCI: LAD stents patent, 80 ost Diag (jailed)->PTCA, LCX/RCA patent. d. s/p NSTEMI in 11/2021 with DES to D1   Essential hypertension    GI bleed    History of blood transfusion    was getting 4 units   Hyperlipidemia    Ischemic cardiomyopathy    a.  echo 08/06/11: dist ant wall, apical and septal and infero-apical HK, mild LVH, EF 40%, mild LAE, PASP 34, asc aorta mildly dilated, mild TR;  b.  Echo (3/13) showed recovery  of LV systolic function with EF 55-60%, grade I diastolic dysfunction, mild MR.    Myocardial infarction [facility])    twice with NSTEMI 2012   Palpitations    Type 2 diabetes mellitus [facility])     Past Surgical History:  Procedure Laterality Date   ANTERIOR CERVICAL DECOMP/DISCECTOMY FUSION  in the 90s   CHOLECYSTECTOMY  03/27/2019   COLONOSCOPY  2021   COLONOSCOPY WITH ESOPHAGOGASTRODUODENOSCOPY (EGD)  10/2019   CORONARY ANGIOPLASTY  12/15/2012; 12/22/2012   CORONARY ANGIOPLASTY WITH STENT PLACEMENT  07/2011   1 (12/22/2012)   CORONARY STENT INTERVENTION N/A 12/08/2021   Procedure: CORONARY STENT INTERVENTION;  Surgeon: Wendel Lurena POUR, MD;  [facility];  Service: Cardiovascular;  Laterality: N/A;   GASTRIC BYPASS  12/21/2017   KNEE ARTHROSCOPY Left 12/31/2016   LEFT HEART CATH AND CORONARY ANGIOGRAPHY N/A 12/08/2021   Procedure: LEFT HEART CATH AND CORONARY ANGIOGRAPHY;  Surgeon: Wendel Lurena POUR, MD;  [facility];  Service: Cardiovascular;  Laterality: N/A;   LEFT HEART CATHETERIZATION WITH CORONARY ANGIOGRAM N/A 08/06/2011   Procedure: LEFT HEART CATHETERIZATION WITH CORONARY ANGIOGRAM;  Surgeon: Lonni JONETTA Cash, MD;  Location: [facility] CATH LAB;  Service: Cardiovascular;  Laterality: N/A;   LEFT HEART CATHETERIZATION WITH CORONARY ANGIOGRAM N/A 12/15/2012   Procedure: LEFT HEART CATHETERIZATION WITH CORONARY ANGIOGRAM;  Surgeon: Ozell Fell, MD;  Location: [facility] CATH LAB;  Service: Cardiovascular;  Laterality:  N/A;   LEFT HEART CATHETERIZATION WITH CORONARY ANGIOGRAM N/A 12/22/2012   Procedure: LEFT HEART CATHETERIZATION WITH CORONARY ANGIOGRAM;  Surgeon: Ozell Fell, MD;  Location: [facility] CATH LAB;  Service: Cardiovascular;  Laterality: N/A;   PERCUTANEOUS CORONARY STENT INTERVENTION (PCI-S) Right 12/15/2012   Procedure: PERCUTANEOUS CORONARY STENT INTERVENTION (PCI-S);  Surgeon: Ozell Fell, MD;  Location: [name] CATH LAB;  Service: Cardiovascular;  Laterality:  Right;    Current Outpatient Medications  Medication Instructions   aspirin  EC 81 mg, Daily   atorvastatin  (LIPITOR ) 80 mg, Oral, Daily at bedtime   Blood Glucose Monitoring Suppl (ONE TOUCH ULTRA 2) w/Device KIT by Does not apply route.   carvedilol  (COREG ) 6.25 mg, Oral, 2 times daily with meals   Continuous Glucose Receiver (FREESTYLE LIBRE 3 READER) DEVI 1 Device, Does not apply, Daily   Continuous Glucose Sensor (FREESTYLE LIBRE 3 PLUS SENSOR) MISC 1 Device, Other, Every 14 days, Change sensor every 15 days.   cyanocobalamin  (VITAMIN B12) 1,000 mcg, Daily   dapagliflozin  propanediol (FARXIGA ) 10 mg, Oral, Daily   FLUoxetine  (PROZAC ) 40 mg, Oral, Daily   gabapentin  (NEURONTIN ) 300 mg, Oral, 2 times daily   glipiZIDE  (GLUCOTROL ) 5 mg, Oral, Daily before breakfast   glucose blood (ONETOUCH ULTRA) test strip Check blood sugar 2-3 times daily.  Dx Code: E11.9   Insulin  Pen Needle 32G X 4 MM MISC 1 Device, Does not apply, Daily   Lantus  SoloStar 30 Units, Subcutaneous, Daily   magnesium  oxide (MAG-OX) 400 mg, Oral, 2 times daily   metFORMIN  (GLUCOPHAGE ) 1,000 mg, Oral, 2 times daily with meals   Multiple Vitamins-Minerals (MULTIVITAMIN WITH MINERALS) tablet 1 tablet, Daily   nitroGLYCERIN  (NITROSTAT ) 0.4 mg, Sublingual, Every 5 min x3 PRN   pantoprazole  (PROTONIX ) 40 mg, Oral, 2 times daily   Potassium 99 mg, Daily   primidone  (MYSOLINE ) 50 MG tablet TAKE 4 TABLETS BY MOUTH EVERY MORNING and TAKE 3 TABLETS AT BEDTIME   sacubitril -valsartan  (ENTRESTO ) 24-26 MG 1 tablet, Oral, 2 times daily   spironolactone  (ALDACTONE ) 25 MG tablet TAKE 1/2 TABLET BY MOUTH DAILY (cut in half)   Vitamin D  (Ergocalciferol ) (DRISDOL ) 50,000 Units, Every 7 days   vitamin D3 2,000 Units, Daily       Objective:   Physical Exam BP 138/72 (BP Location: Left Arm, Patient Position: Sitting, Cuff Size: Large)   Pulse 67   Ht 6' 2 (1.88 m)   Wt 224 lb 12.8 oz (102 kg)   SpO2 94%   BMI 28.86 kg/m  General:    Well developed, NAD, BMI noted. HEENT:  Normocephalic . Face symmetric, atraumatic Lungs:  CTA B Normal respiratory effort, no intercostal retractions, no accessory muscle use. Heart: RRR,  no murmur.  Lower extremities: no pretibial edema bilaterally  Skin: At the left cheek, has 0.5 cm skin lesion, elevated, not hyperpigmented, likely SK.  + evidence of recent bleeding from recent shaving Neurologic:  alert & oriented X3.  Speech normal, gait seems unsteady, has some difficulty transferring.  No change compared to previous months that I can tell.uses a cane  Psych--  Cognition and judgment appear intact.  Cooperative with normal attention span and concentration.  Behavior appropriate. No anxious or depressed appearing.      Assessment   Assessment   DM : insulin  dependent,  w/ CAD CHF. Intol Trulicity , per endo Neuropathy, severe, documented by NCS 2022 HTN Hyperlipidemia CV: ---CAD  NSTEMI in 07/2011; unstable angina in 11/2012 and 12/2012. NSTMI 11-2021 .  ---CHF, ischemic  cardiomyopathy, PVCs (Holter 2019 show 13.6% burden) OSA dx ~08-2017, not on CPAP as off 05-2023, essentially asymptomatic since he lost weight after bariatric surgery CKD, sees nephrology. 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  Improved CAD, HFmrEF.  Aortic root dilatation: Saw cardiology 05/10/2024.  Last EF is stable at 45% per echo 05-2023.  No changes made. CKD: Stage GR 1/A3, last seen by Dr. Rayburn August 2025.  Recommended BP less than 130/80, A1c less than 7.  Noted to be intolerant to Ozempic  or Mounjaro .  Next visit w/ them--  1 year Check  a BMP Polyneuropathy with gait abnormality and history of falls Worsening balance and multiple falls  likely due to neuropathy. Cane ineffective. No major injuries, heel pain noted. No stroke or neurological deficits. - Referred to physical therapy (at [facility] near his residence) for walker fitting  Seborrheic keratosis, left cheek Seborrheic keratosis with bleeding from shaving and trauma.  Referred to dermatology HTN Blood pressure well-controlled at 138/72 mmHg. - Continue current antihypertensive regimen. Hyperlipidemia Cholesterol levels controlled with atorvastatin . No change Vitamin B12 and Vit D deficiency: On OTC's daily, check levels General Health Maintenance PNM 20 today. Already had a flu shot, recommend to proceed with a COVID-vaccine and he is inclined to do so. RTC 4 months.      "

## 2024-08-19 LAB — BASIC METABOLIC PANEL WITH GFR
BUN: 19 mg/dL (ref 7–25)
CO2: 27 mmol/L (ref 20–32)
Calcium: 8.9 mg/dL (ref 8.6–10.3)
Chloride: 106 mmol/L (ref 98–110)
Creat: 0.81 mg/dL (ref 0.70–1.28)
Glucose, Bld: 79 mg/dL (ref 65–99)
Potassium: 4.5 mmol/L (ref 3.5–5.3)
Sodium: 142 mmol/L (ref 135–146)
eGFR: 94 mL/min/1.73m2

## 2024-08-19 LAB — VITAMIN D 25 HYDROXY (VIT D DEFICIENCY, FRACTURES): Vit D, 25-Hydroxy: 66 ng/mL (ref 30–100)

## 2024-08-19 LAB — VITAMIN B12: Vitamin B-12: 439 pg/mL (ref 200–1100)

## 2024-08-20 ENCOUNTER — Ambulatory Visit: Payer: Self-pay | Admitting: Internal Medicine

## 2024-08-20 NOTE — Assessment & Plan Note (Signed)
 DM: Per Endo, last visit 07/27/2024, A1c was 7.0.  Improved CAD, HFmrEF.  Aortic root dilatation: Saw cardiology 05/10/2024.  Last EF is stable at 45% per echo 05-2023.  No changes made. CKD: Stage GR 1/A3, last seen by Dr. Rayburn August 2025.  Recommended BP less than 130/80, A1c less than 7.  Noted to be intolerant to Ozempic  or Mounjaro .  Next visit w/ them--  1 year Check  a BMP Polyneuropathy with gait abnormality and history of falls Worsening balance and multiple falls likely due to neuropathy. Cane ineffective. No major injuries, heel pain noted. No stroke or neurological deficits. - Referred to physical therapy (at [facility] near his residence) for walker fitting  Seborrheic keratosis, left cheek Seborrheic keratosis with bleeding from shaving and trauma.  Referred to dermatology HTN Blood pressure well-controlled at 138/72 mmHg. - Continue current antihypertensive regimen. Hyperlipidemia Cholesterol levels controlled with atorvastatin . No change Vitamin B12 and Vit D deficiency: On OTC's daily, check levels General Health Maintenance PNM 20 today. Already had a flu shot, recommend to proceed with a COVID-vaccine and he is inclined to do so. RTC 4 months.

## 2024-08-22 ENCOUNTER — Other Ambulatory Visit: Payer: Self-pay

## 2024-08-22 ENCOUNTER — Other Ambulatory Visit: Payer: Self-pay | Admitting: Internal Medicine

## 2024-08-30 ENCOUNTER — Encounter: Payer: Self-pay | Admitting: Internal Medicine

## 2024-09-01 ENCOUNTER — Other Ambulatory Visit: Payer: Self-pay | Admitting: Neurology

## 2024-09-01 DIAGNOSIS — G250 Essential tremor: Secondary | ICD-10-CM

## 2024-09-06 NOTE — Therapy (Addendum)
 " OUTPATIENT PHYSICAL THERAPY EVALUATION Discharge Addendum  PHYSICAL THERAPY DISCHARGE SUMMARY  Visits from Start of Care: 1  Current functional level related to goals / functional outcomes: See below   Remaining deficits: See below   Education / Equipment: HEP  Plan:  Patient goals were not met. Patient is being discharged at his request. Pt elects to continue with HEP. Massie Ada PT, DPT 09/11/24 8:54 AM       Patient Name: Randy Castro. MRN: [id number] DOB:09-04-1950, 74 y.o., male Today's Date: 09/07/2024  END OF SESSION:  PT End of Session - 09/07/24 0801     Visit Number 1    Number of Visits 24    Date for Recertification  11/30/24    Authorization Type [employer] AD[employer]    PT Start Time 0800    PT Stop Time 0845    PT Time Calculation (min) 45 min    Activity Tolerance Patient tolerated treatment well    Behavior During Therapy [facility] for tasks assessed/performed          Past Medical History:  Diagnosis Date   Anxiety    Arthritis    BCC (basal cell carcinoma of skin) 12/2020   CAD (coronary artery disease)    a. NSTEMI 12/12: EF 40-45%. ERP:Rluupwh balloon PTCA + Promus DES x 1 to mid LAD;  b. 11/2012: Cath with Cutting balloon PTCA  LAD for ISR to LAD, EF 55%;  c. 12/2012 Cath/PCI: LAD stents patent, 80 ost Diag (jailed)->PTCA, LCX/RCA patent. d. s/p NSTEMI in 11/2021 with DES to D1   Essential hypertension    GI bleed    History of blood transfusion    was getting 4 units   Hyperlipidemia    Ischemic cardiomyopathy    a.  echo 08/06/11: dist ant wall, apical and septal and infero-apical HK, mild LVH, EF 40%, mild LAE, PASP 34, asc aorta mildly dilated, mild TR;  b.  Echo (3/13) showed recovery of LV systolic function with EF 55-60%, grade I diastolic dysfunction, mild MR.    Myocardial infarction [facility])    twice with NSTEMI 2012   Palpitations    Type 2 diabetes mellitus [facility])    Past Surgical History:  Procedure Laterality Date   ANTERIOR  CERVICAL DECOMP/DISCECTOMY FUSION  in the 90s   CHOLECYSTECTOMY  03/27/2019   COLONOSCOPY  2021   COLONOSCOPY WITH ESOPHAGOGASTRODUODENOSCOPY (EGD)  10/2019   CORONARY ANGIOPLASTY  12/15/2012; 12/22/2012   CORONARY ANGIOPLASTY WITH STENT PLACEMENT  07/2011   1 (12/22/2012)   CORONARY STENT INTERVENTION N/A 12/08/2021   Procedure: CORONARY STENT INTERVENTION;  Surgeon: Wendel Lurena POUR, MD;  Location: [facility] INVASIVE CV LAB;  Service: Cardiovascular;  Laterality: N/A;   GASTRIC BYPASS  12/21/2017   KNEE ARTHROSCOPY Left 12/31/2016   LEFT HEART CATH AND CORONARY ANGIOGRAPHY N/A 12/08/2021   Procedure: LEFT HEART CATH AND CORONARY ANGIOGRAPHY;  Surgeon: Wendel Lurena POUR, MD;  Location: [facility] INVASIVE CV LAB;  Service: Cardiovascular;  Laterality: N/A;   LEFT HEART CATHETERIZATION WITH CORONARY ANGIOGRAM N/A 08/06/2011   Procedure: LEFT HEART CATHETERIZATION WITH CORONARY ANGIOGRAM;  Surgeon: Lonni JONETTA Cash, MD;  Location: [facility] CATH LAB;  Service: Cardiovascular;  Laterality: N/A;   LEFT HEART CATHETERIZATION WITH CORONARY ANGIOGRAM N/A 12/15/2012   Procedure: LEFT HEART CATHETERIZATION WITH CORONARY ANGIOGRAM;  Surgeon: Ozell Fell, MD;  Location: [facility] CATH LAB;  Service: Cardiovascular;  Laterality: N/A;   LEFT HEART CATHETERIZATION WITH CORONARY ANGIOGRAM N/A 12/22/2012   Procedure: LEFT HEART  CATHETERIZATION WITH CORONARY ANGIOGRAM;  Surgeon: Ozell Fell, MD;  Location: [facility] CATH LAB;  Service: Cardiovascular;  Laterality: N/A;   PERCUTANEOUS CORONARY STENT INTERVENTION (PCI-S) Right 12/15/2012   Procedure: PERCUTANEOUS CORONARY STENT INTERVENTION (PCI-S);  Surgeon: Ozell Fell, MD;  Location: [facility] CATH LAB;  Service: Cardiovascular;  Laterality: Right;   Patient Active Problem List   Diagnosis Date Noted   CKD stage G1/A3, GFR > 90 and albumin creatinine ratio >300 mg/g 07/12/2024   Elevated troponin 12/06/2021   GERD (gastroesophageal reflux disease) 12/06/2021   Type 2 diabetes mellitus with  diabetic polyneuropathy, with long-term current use of insulin  (HCC) 11/18/2021   BCC (basal cell carcinoma of skin) 12/30/2020   Iliac artery aneurysm, 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   Anemia 11/14/2017   OSA (obstructive sleep apnea) 09/21/2017   Obesity 01/23/2016   PCP NOTES >>> 06/08/2015   Anxiety and depression 05/31/2013   Intermediate coronary syndrome (HCC) 12/22/2012   Unstable angina (HCC) 12/16/2012   Fatigue 04/26/2012   Annual physical exam 10/28/2011   Coronary Artery Disease 08/28/2011   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   Palpitations 08/28/2011   NSTEMI (non-ST elevated myocardial infarction) (HCC) 08/07/2011   DJD (degenerative joint disease) 05/28/2011   Hyperlipidemia    HTN (hypertension)     PCP: Amon Aloysius BRAVO, MD   REFERRING PROVIDER: Amon Aloysius BRAVO, MD   REFERRING DIAG: R26.9 (ICD-10-CM) - Gait disorder   Rationale for Evaluation and Treatment:  Rehabiliation  THERAPY DIAG:  Other abnormalities of gait and mobility  Muscle weakness (generalized)  Repeated falls  ONSET DATE: gradual over 2 years   SUBJECTIVE:                                                                                                                                                                                           SUBJECTIVE STATEMENT: Pt reports difficulty walking due to increase in neuropathy in BLE and pain in L knee (meniscus tear). Pt reports he continues to feel unsteady while walking to perform ADLS unless holding onto shopping cart or utilization of [facility] which he feels is becoming not enough to keep him safe. Pt desires a rollator for improved ambulation safety.  NEXT MD VISIT: 11/13/24 Cardiology, 12/18/24 PCP  PERTINENT HISTORY:  See above PMH  PAIN: generally 3-4/10 NPRS scale: 3/10 upon arrival Pain location: L knee; BLE Pain description: constant, achy, sharp; tingling  Aggravating factors: twisting, turning Relieving  factors: rest, meds   PRECAUTIONS: ,  Fall  RED FLAGS: None   WEIGHT BEARING RESTRICTIONS:  No  FALLS:  Has patient fallen in last  6 months? Yes. Number of falls 3 times because of feeling unsteady.   OCCUPATION:  Retired; HOSPITAL DOCTOR  PLOF:  Independent with community mobility with device  PATIENT GOALS:  Pt would like to improve his mobility in order to participate in ADLS with less fatigue and decrease likelihood of falling  OBJECTIVE:  Note: Objective measures were completed at Evaluation unless otherwise noted.  DIAGNOSTIC FINDINGS:   Xray 06/22/24: Mild tricompartmental osteoarthritis.   PATIENT SURVEYS:   Patient-Specific Activity Scoring Scheme  0 represents unable to perform. 10 represents able to perform at prior level. 0 1 2 3 4 5 6 7 8 9  10 (Date and Score)   Activity Eval     1. Walking  3     2. Standing  5    3.     4.    5.    Score 4    Total score = sum of the activity scores/number of activities Minimum detectable change (90%CI) for average score = 2 points Minimum detectable change (90%CI) for single activity score = 3 points     GAIT: Assistive device utilized: Single point cane Level of assistance: SBA to CGA Comments: Wide BOS, increased stance time, deviation, and high steppage gait    LOWER EXTREMITY ROM:     Active  Right eval Left eval  Hip flexion    Hip extension    Hip abduction    Hip adduction    Hip internal rotation    Hip external rotation    Knee flexion [facility] WFL  Knee extension [facility]  Ankle dorsiflexion [facility] WFL  Ankle plantarflexion [facility] WFL  Ankle inversion    Ankle eversion     (Blank rows = not tested)   LOWER EXTREMITY MMT:    MMT Right eval Left eval  Hip flexion 3+ 3+  Hip extension    Hip abduction 3+ 3+  Hip adduction 3 3  Hip internal rotation    Hip external rotation    Knee flexion 4 4-  Knee extension 4- 3+  Ankle dorsiflexion 4 3+  Ankle plantarflexion    Ankle inversion     Ankle eversion     (Blank rows = not tested)     FUNCTIONAL TESTS:  5 times sit to stand: 17.5s with hand on knees Timed up and go (TUG): 15.84s with SPC and UE assistance to stand                                                                                                                              TREATMENT DATE:  Eval HEP creation and review with demonstration and trial set preformed, see below for details Selfcare:see education section below    PATIENT EDUCATION: Education details: HEP, PT plan of care, Person educated: Patient educated on exam findings and the appropriateness of physical therapy. Education method: Explanation, Demonstration, Verbal cues, and Handouts Education comprehension: verbalized understanding, further education recommended  HOME EXERCISE PROGRAM: Access Code: [email or web address] URL: [email or web address] Date: 09/07/2024 Prepared by: Massie Ada  Exercises - Sit to Stand with Armchair  - 1 x daily - 7 x weekly - 3 sets - 10 reps - Standing March with Counter Support  - 1 x daily - 7 x weekly - 3 sets - 10 reps - 30sec hold - Feet Together Balance at [employer] Eyes Closed  - 1 x daily - 7 x weekly - 1 sets - 5-10 reps - 10-20 sec hold  ASSESSMENT:  CLINICAL IMPRESSION: Patient referred to PT for abnormal gait. Patient will benefit from skilled PT to improve overall function and to address impairments and limitations listed below and decrease likelihood of falling.  OBJECTIVE IMPAIRMENTS: decreased activity tolerance for ADL's, difficulty walking, decreased balance, decreased endurance, decreased mobility, decreased ROM, decreased strength, impaired flexibility, impaired LE use, and pain.  ACTIVITY LIMITATIONS: bending, lifting, walking, standing, cleaning, community activity  PERSONAL FACTORS: see above PMH  also affecting patient's functional outcome.  REHAB POTENTIAL: Good  CLINICAL DECISION MAKING: Evolving/moderate  complexity  EVALUATION COMPLEXITY: Moderate    GOALS: Short term PT Goals Target date: 10/05/2024   Pt will be I and compliant with HEP. Baseline: No HEP Goal status: New  Long term PT goals Target date: 11/02/2024    Pt will improve hip/knee strength to at least 4/5 MMT to improve functional strength Baseline: 4/5 or less t/o Goal status: New  Pt will improve Patient specific functional scale (PSFS) to at least 7/10 to show improved function level Baseline: 4/10  Goal status: New  Pt will improve FGA score by 4 points in order to demonstrate clinical significant improvement in gait quality and decrease likelihood of falls. Baseline: Not tested Goal status: New  Pt will improve 5 times sit to stand and TUG time to less than 13 seconds to show improved strength, balance.  Baseline: 5XSTS: 17.5s; TUG: 15.84s  Goal status: New  PLAN: PT FREQUENCY: 1-3 times per week   PT DURATION: 6-12 weeks  PLANNED INTERVENTIONS  [id number]-Therapeutic exercises, 97530- Therapeutic activity, [id number]- Neuromuscular re-education, 97535- Self Care, [id number]- Manual therapy, [phone]- Gait training, and Patient/Family education  PLAN FOR NEXT SESSION:   Administer Berg and/or FGA/DGI. Initiate LE strengthening, balance, and gait training  Massie Ada PT, DPT 09/07/24 12:24 PM   "

## 2024-09-07 ENCOUNTER — Ambulatory Visit: Attending: Internal Medicine

## 2024-09-07 DIAGNOSIS — R2689 Other abnormalities of gait and mobility: Secondary | ICD-10-CM | POA: Insufficient documentation

## 2024-09-07 DIAGNOSIS — R269 Unspecified abnormalities of gait and mobility: Secondary | ICD-10-CM | POA: Insufficient documentation

## 2024-09-07 DIAGNOSIS — M6281 Muscle weakness (generalized): Secondary | ICD-10-CM | POA: Diagnosis present

## 2024-09-07 DIAGNOSIS — R296 Repeated falls: Secondary | ICD-10-CM | POA: Diagnosis present

## 2024-09-12 ENCOUNTER — Ambulatory Visit: Admitting: Physical Therapy

## 2024-09-13 ENCOUNTER — Telehealth: Payer: Self-pay | Admitting: Pharmacist

## 2024-09-13 NOTE — Telephone Encounter (Signed)
 Patient's wife called states they received a letter that Entrestro is not on formulary for 2026.  There is a new generic available for Entresto  that they should ask pharmacy to switch to.  She also said she did not think the pharmacy ran his [employer]. He still has about $4500 left on his grant and it does not expire until 02/13/2025.  She will contact pharmacy and if any further issues will call me back.

## 2024-09-19 ENCOUNTER — Ambulatory Visit

## 2024-09-21 ENCOUNTER — Ambulatory Visit

## 2024-09-25 ENCOUNTER — Ambulatory Visit: Admitting: Physical Therapy

## 2024-09-27 ENCOUNTER — Ambulatory Visit: Admitting: Physical Therapy

## 2024-10-02 ENCOUNTER — Ambulatory Visit

## 2024-10-04 ENCOUNTER — Ambulatory Visit

## 2024-10-09 ENCOUNTER — Ambulatory Visit

## 2024-10-11 ENCOUNTER — Ambulatory Visit

## 2024-10-16 ENCOUNTER — Ambulatory Visit

## 2024-10-18 ENCOUNTER — Ambulatory Visit: Admitting: Physical Therapy

## 2024-10-23 ENCOUNTER — Ambulatory Visit

## 2024-10-25 ENCOUNTER — Ambulatory Visit: Admitting: Physical Therapy

## 2024-11-13 ENCOUNTER — Ambulatory Visit: Admitting: Cardiology

## 2024-12-18 ENCOUNTER — Ambulatory Visit: Admitting: Internal Medicine

## 2025-01-17 ENCOUNTER — Other Ambulatory Visit

## 2025-01-25 ENCOUNTER — Ambulatory Visit: Admitting: Internal Medicine

## 2025-06-18 ENCOUNTER — Ambulatory Visit
